# Patient Record
Sex: Male | Born: 1965 | Race: White | Hispanic: No | Marital: Married | Smoking: Current every day smoker
Health system: Southern US, Community
[De-identification: ages and names within clinical notes are randomized; demographics above are authoritative.]

## PROBLEM LIST (undated history)

## (undated) DIAGNOSIS — M1990 Unspecified osteoarthritis, unspecified site: Secondary | ICD-10-CM

## (undated) DIAGNOSIS — Z9359 Other cystostomy status: Secondary | ICD-10-CM

## (undated) DIAGNOSIS — Z515 Encounter for palliative care: Secondary | ICD-10-CM

## (undated) DIAGNOSIS — K6289 Other specified diseases of anus and rectum: Secondary | ICD-10-CM

## (undated) DIAGNOSIS — N200 Calculus of kidney: Secondary | ICD-10-CM

## (undated) DIAGNOSIS — F419 Anxiety disorder, unspecified: Secondary | ICD-10-CM

## (undated) DIAGNOSIS — Z973 Presence of spectacles and contact lenses: Secondary | ICD-10-CM

## (undated) DIAGNOSIS — K439 Ventral hernia without obstruction or gangrene: Secondary | ICD-10-CM

## (undated) DIAGNOSIS — Z972 Presence of dental prosthetic device (complete) (partial): Secondary | ICD-10-CM

## (undated) DIAGNOSIS — F329 Major depressive disorder, single episode, unspecified: Secondary | ICD-10-CM

## (undated) DIAGNOSIS — C20 Malignant neoplasm of rectum: Secondary | ICD-10-CM

## (undated) DIAGNOSIS — F32A Depression, unspecified: Secondary | ICD-10-CM

## (undated) DIAGNOSIS — C649 Malignant neoplasm of unspecified kidney, except renal pelvis: Secondary | ICD-10-CM

## (undated) DIAGNOSIS — IMO0001 Reserved for inherently not codable concepts without codable children: Secondary | ICD-10-CM

## (undated) HISTORY — DX: Calculus of kidney: N20.0

## (undated) HISTORY — PX: RECTAL SURGERY: SHX760

## (undated) HISTORY — DX: Other specified diseases of anus and rectum: K62.89

## (undated) HISTORY — PX: HYPOSPADIAS CORRECTION: SHX483

## (undated) HISTORY — PX: KNEE SURGERY: SHX244

## (undated) HISTORY — PX: LUNG BIOPSY: SHX232

## (undated) HISTORY — DX: Malignant neoplasm of unspecified kidney, except renal pelvis: C64.9

---

## 2010-05-02 HISTORY — PX: LAPAROSCOPIC PARTIAL NEPHRECTOMY: SUR782

## 2014-09-20 ENCOUNTER — Emergency Department (HOSPITAL_COMMUNITY): Payer: Medicaid Other

## 2014-09-20 ENCOUNTER — Emergency Department (HOSPITAL_COMMUNITY)
Admission: EM | Admit: 2014-09-20 | Discharge: 2014-09-20 | Disposition: A | Discharge status: Home / Self Care | Payer: Medicaid Other | Attending: Emergency Medicine | Admitting: Emergency Medicine

## 2014-09-20 ENCOUNTER — Encounter (HOSPITAL_COMMUNITY): Payer: Self-pay | Admitting: *Deleted

## 2014-09-20 DIAGNOSIS — Z720 Tobacco use: Secondary | ICD-10-CM | POA: Insufficient documentation

## 2014-09-20 DIAGNOSIS — K6289 Other specified diseases of anus and rectum: Secondary | ICD-10-CM | POA: Diagnosis present

## 2014-09-20 DIAGNOSIS — Z791 Long term (current) use of non-steroidal anti-inflammatories (NSAID): Secondary | ICD-10-CM | POA: Insufficient documentation

## 2014-09-20 LAB — BASIC METABOLIC PANEL
ANION GAP: 6 (ref 5–15)
BUN: 8 mg/dL (ref 6–20)
CO2: 28 mmol/L (ref 22–32)
CREATININE: 0.9 mg/dL (ref 0.61–1.24)
Calcium: 8.9 mg/dL (ref 8.9–10.3)
Chloride: 105 mmol/L (ref 101–111)
GFR calc Af Amer: >60 mL/min (ref >60)
GFR calc non Af Amer: >60 mL/min (ref >60)
Glucose, Bld: 92 mg/dL (ref 65–99)
Potassium: 4 mmol/L (ref 3.5–5.1)
Sodium: 139 mmol/L (ref 135–145)

## 2014-09-20 LAB — CBC WITH DIFFERENTIAL/PLATELET
BASOS PCT: 1 % (ref 0–1)
Basophils Absolute: 0.1 10*3/uL (ref 0.0–0.1)
Eosinophils Absolute: 0.5 10*3/uL (ref 0.0–0.7)
Eosinophils Relative: 5 % (ref 0–5)
HCT: 46.2 % (ref 39.0–52.0)
HEMOGLOBIN: 15.4 g/dL (ref 13.0–17.0)
LYMPHS ABS: 2.6 10*3/uL (ref 0.7–4.0)
LYMPHS PCT: 27 % (ref 12–46)
MCH: 30.7 pg (ref 26.0–34.0)
MCHC: 33.3 g/dL (ref 30.0–36.0)
MCV: 92 fL (ref 78.0–100.0)
Monocytes Absolute: 0.9 10*3/uL (ref 0.1–1.0)
Monocytes Relative: 9 % (ref 3–12)
Neutro Abs: 5.6 10*3/uL (ref 1.7–7.7)
Neutrophils Relative %: 58 % (ref 43–77)
Platelets: 326 10*3/uL (ref 150–400)
RBC: 5.02 MIL/uL (ref 4.22–5.81)
RDW: 12.9 % (ref 11.5–15.5)
WBC: 9.6 10*3/uL (ref 4.0–10.5)

## 2014-09-20 MED ORDER — OXYCODONE-ACETAMINOPHEN 5-325 MG PO TABS
1.0000 | ORAL_TABLET | ORAL | Status: DC | PRN
Start: 2014-09-20 — End: 2014-09-21

## 2014-09-20 MED ORDER — HYDROMORPHONE HCL 1 MG/ML IJ SOLN
1.0000 mg | Freq: Once | INTRAMUSCULAR | Status: AC
  Administered 2014-09-20: 1 mg via INTRAVENOUS
  Filled 2014-09-20: qty 1

## 2014-09-20 MED ORDER — METRONIDAZOLE 500 MG PO TABS
500.0000 mg | ORAL_TABLET | Freq: Once | ORAL | Status: AC
  Administered 2014-09-20: 500 mg via ORAL
  Filled 2014-09-20: qty 1

## 2014-09-20 MED ORDER — MORPHINE SULFATE 4 MG/ML IJ SOLN
4.0000 mg | Freq: Once | INTRAMUSCULAR | Status: AC
  Administered 2014-09-20: 4 mg via INTRAVENOUS
  Filled 2014-09-20: qty 1

## 2014-09-20 MED ORDER — IOHEXOL 300 MG/ML  SOLN
100.0000 mL | Freq: Once | INTRAMUSCULAR | Status: AC | PRN
  Administered 2014-09-20: 100 mL via INTRAVENOUS

## 2014-09-20 MED ORDER — CIPROFLOXACIN IN D5W 400 MG/200ML IV SOLN
400.0000 mg | Freq: Once | INTRAVENOUS | Status: AC
  Administered 2014-09-20: 400 mg via INTRAVENOUS
  Filled 2014-09-20: qty 200

## 2014-09-20 MED ORDER — METRONIDAZOLE IN NACL 5-0.79 MG/ML-% IV SOLN
500.0000 mg | Freq: Once | INTRAVENOUS | Status: DC
  Filled 2014-09-20: qty 100

## 2014-09-20 MED ORDER — METRONIDAZOLE 500 MG PO TABS: 500.0000 mg | ORAL_TABLET | Freq: Two times a day (BID) | ORAL | Status: DC

## 2014-09-20 MED ORDER — CIPROFLOXACIN HCL 500 MG PO TABS
500.0000 mg | ORAL_TABLET | Freq: Two times a day (BID) | ORAL | Status: DC
Start: 2014-09-20 — End: 2014-09-26

## 2014-09-20 MED ORDER — IOHEXOL 300 MG/ML  SOLN
50.0000 mL | Freq: Once | INTRAMUSCULAR | Status: AC | PRN
  Administered 2014-09-20: 50 mL via ORAL

## 2014-09-20 NOTE — Discharge Instructions (Signed)
PLEASE FOLLOWUP IN ONE WEEK FOR EVALUATION AS THIS MAY BE CANCER BE SURE TO USE STOOL SOFTENERS TO LESSEN IMPACT OF HARD STOOL RETURN TO ER FOR ANY NEW ABDOMINAL PAIN WITH FEVER UP TO 100.80F OR VOMITING OVER NEXT 24-48 HOURS

## 2014-09-20 NOTE — ED Notes (Signed)
Phlebotomy at bedside.

## 2014-09-20 NOTE — ED Notes (Signed)
MD at bedside. 

## 2014-09-20 NOTE — ED Provider Notes (Signed)
CSN: [id number]     Arrival date & time 09/20/14  1220 History   First MD Initiated Contact with Patient 09/20/14 1330     Chief Complaint  Patient presents with  . Rectal Pain    The history is provided by the patient.  Patient presents with gradually worsening rectal pain for 6 weeks.  It is worsened with palpation/defecation and improved with rest.  No fever/vomiting.  He reports bloody stools at times.  No rectal trauma/surgery reported.  No h/o diabetes.  He reports rectal itching at times as well He denies abd pain He denies fever  PMH - none  Past Surgical History  Procedure Laterality Date  . Hypospadias correction    . Laparoscopic partial nephrectomy      right side  . Knee surgery     No family history on file. History  Substance Use Topics  . Smoking status: Current Every Day Smoker    Types: Cigarettes  . Smokeless tobacco: Not on file  . Alcohol Use: Yes     Comment: Occ    Review of Systems  Constitutional: Negative for fever.  Gastrointestinal: Positive for rectal pain. Negative for vomiting.  All other systems reviewed and are negative.     Allergies  Review of patient's allergies indicates no known allergies.  Home Medications   Prior to Admission medications   Medication Sig Start Date End Date Taking? Authorizing Provider  naproxen (NAPROSYN) 250 MG tablet Take 250 mg by mouth 2 (two) times daily with a meal.   Yes Historical Provider, MD   BP 154/104 mmHg  Pulse 103  Temp(Src) 98.5 F (36.9 C) (Oral)  Resp 22  Ht '5\' 5"'$  (1.651 m)  Wt 220 lb (99.791 kg)  BMI 36.61 kg/m2  SpO2 99% Physical Exam CONSTITUTIONAL: Well developed/well nourished HEAD: Normocephalic/atraumatic EYES: EOMI/PERRL ENMT: Mucous membranes moist NECK: supple no meningeal signs SPINE/BACK:entire spine nontender CV: S1/S2 noted, no murmurs/rubs/gallops noted LUNGS: Lungs are clear to auscultation bilaterally, no apparent distress ABDOMEN: soft, nontender, no rebound  or guarding, bowel sounds noted throughout abdomen GU:no cva tenderness Rectal - chaperone present - no external hemorrhoids.  No evidence of trauma.  No external erythema.  He has significant tenderness with rectal exam and has induration noted on digital exam, no external mass noted.  No rectal bleeding noted NEURO: Pt is awake/alert/appropriate, moves all extremitiesx4.  No facial droop.   EXTREMITIES: pulses normal/equal, full ROM SKIN: warm, color normal PSYCH: no abnormalities of mood noted, alert and oriented to situation   ED Course  Procedures  2:13 PM Pt with significant rectal tenderness, will proceed with CT imaging to evaluation for any abscess 5:10 PM Possible rectal mass on CT imaging Advised need for f/u with gen surgery for possible anorectal CA Pt understands this Will also start on cipro/flagyl in case there is any infectious component Pt is well appearing/nontoxic and appropriate for d/c home  Labs Review Labs Reviewed  BASIC METABOLIC PANEL  CBC WITH DIFFERENTIAL/PLATELET    Imaging Review Ct Pelvis W Contrast  09/20/2014   CLINICAL DATA:  Severe rectal pain.  Bright red blood in stool.  EXAM: CT PELVIS WITH CONTRAST  TECHNIQUE: Multidetector CT imaging of the pelvis was performed using the standard protocol following the bolus administration of intravenous contrast.  CONTRAST:  51m OMNIPAQUE IOHEXOL 300 MG/ML SOLN, 1028mOMNIPAQUE IOHEXOL 300 MG/ML SOLN  COMPARISON:  None.  FINDINGS: There is questionable asymmetric wall thickening along the right side of the anus/rectum  on image 42. Recommend correlation with digital rectal exam. Moderate stool in the visualized colon. Visualized small bowel is decompressed. Appendix is normal.  Urinary bladder unremarkable. No free fluid, free air or adenopathy.  No acute bony abnormality.  IMPRESSION: Questionable asymmetric wall thickening or mass along the right wall of the anus/ rectum. Recommend correlation with digital rectal  exam.   Electronically Signed   By: Rolm Baptise M.D.   On: 09/20/2014 15:57    Medications  ciprofloxacin (CIPRO) IVPB 400 mg (400 mg Intravenous New Bag/Given 09/20/14 1621)  metroNIDAZOLE (FLAGYL) tablet 500 mg (not administered)  morphine 4 MG/ML injection 4 mg (4 mg Intravenous Given 09/20/14 1411)  iohexol (OMNIPAQUE) 300 MG/ML solution 50 mL (50 mLs Oral Contrast Given 09/20/14 1533)  HYDROmorphone (DILAUDID) injection 1 mg (1 mg Intravenous Given 09/20/14 1524)  iohexol (OMNIPAQUE) 300 MG/ML solution 100 mL (100 mLs Intravenous Contrast Given 09/20/14 1533)     MDM   Final diagnoses:  Rectal mass    Nursing notes including past medical history and social history reviewed and considered in documentation Labs/vital reviewed myself and considered during evaluation     Ripley Fraise, MD 09/20/14 1712

## 2014-09-20 NOTE — ED Notes (Signed)
Pt states severe pain, itching, throbbing to rectal area. Pt states bright red blood to stools intermittently x 6 weeks. Pt states stools have not been solid. Patient unable to sit down while in triage.

## 2014-09-21 ENCOUNTER — Emergency Department (HOSPITAL_COMMUNITY)
Admission: EM | Admit: 2014-09-21 | Discharge: 2014-09-21 | Disposition: A | Discharge status: Home / Self Care | Payer: Medicaid Other | Attending: Emergency Medicine | Admitting: Emergency Medicine

## 2014-09-21 ENCOUNTER — Encounter (HOSPITAL_COMMUNITY): Payer: Self-pay | Admitting: Emergency Medicine

## 2014-09-21 DIAGNOSIS — Z720 Tobacco use: Secondary | ICD-10-CM | POA: Insufficient documentation

## 2014-09-21 DIAGNOSIS — K6289 Other specified diseases of anus and rectum: Secondary | ICD-10-CM | POA: Insufficient documentation

## 2014-09-21 DIAGNOSIS — Z792 Long term (current) use of antibiotics: Secondary | ICD-10-CM | POA: Insufficient documentation

## 2014-09-21 DIAGNOSIS — Z791 Long term (current) use of non-steroidal anti-inflammatories (NSAID): Secondary | ICD-10-CM | POA: Insufficient documentation

## 2014-09-21 MED ORDER — DOCUSATE SODIUM 100 MG PO CAPS: 100.0000 mg | ORAL_CAPSULE | Freq: Two times a day (BID) | ORAL | Status: DC

## 2014-09-21 MED ORDER — DOCUSATE SODIUM 100 MG PO CAPS
200.0000 mg | ORAL_CAPSULE | Freq: Once | ORAL | Status: AC
  Administered 2014-09-21: 200 mg via ORAL
  Filled 2014-09-21 (×2): qty 2

## 2014-09-21 MED ORDER — LORAZEPAM 2 MG/ML IJ SOLN
0.5000 mg | Freq: Once | INTRAMUSCULAR | Status: AC
  Administered 2014-09-21: 0.5 mg via INTRAVENOUS
  Filled 2014-09-21: qty 1

## 2014-09-21 MED ORDER — OXYCODONE-ACETAMINOPHEN 5-325 MG PO TABS: 1.0000 | ORAL_TABLET | Freq: Four times a day (QID) | ORAL | Status: DC | PRN

## 2014-09-21 MED ORDER — HYDROMORPHONE HCL 1 MG/ML IJ SOLN
1.0000 mg | Freq: Once | INTRAMUSCULAR | Status: AC
  Administered 2014-09-21: 1 mg via INTRAVENOUS
  Filled 2014-09-21: qty 1

## 2014-09-21 NOTE — ED Provider Notes (Signed)
CSN: [id number]     Arrival date & time 09/21/14  1652 History   First MD Initiated Contact with Patient 09/21/14 1657     Chief Complaint  Patient presents with  . Rectal Pain     (Consider location/radiation/quality/duration/timing/severity/associated sxs/prior Treatment) Patient is a 49 y.o. male presenting with abdominal pain. The history is provided by the patient (the pt complains of pain in the rectum with a bowel movement.  he has  a mass in his rectum seen yesterday and he is to see a Psychologist, sport and exercise).  Abdominal Pain Pain location: rectum. Pain quality: aching   Pain radiates to:  Does not radiate Pain severity:  Severe Onset quality:  Sudden Timing:  Constant Progression:  Worsening Chronicity:  New Associated symptoms: no chest pain, no cough, no diarrhea, no fatigue and no hematuria     History reviewed. No pertinent past medical history. Past Surgical History  Procedure Laterality Date  . Hypospadias correction    . Laparoscopic partial nephrectomy      right side  . Knee surgery     Family History  Problem Relation Age of Onset  . Cancer Other    History  Substance Use Topics  . Smoking status: Current Every Day Smoker    Types: Cigarettes  . Smokeless tobacco: Not on file  . Alcohol Use: Yes     Comment: Occ    Review of Systems  Constitutional: Negative for appetite change and fatigue.  HENT: Negative for congestion, ear discharge and sinus pressure.   Eyes: Negative for discharge.  Respiratory: Negative for cough.   Cardiovascular: Negative for chest pain.  Gastrointestinal: Positive for rectal pain. Negative for diarrhea.  Genitourinary: Negative for frequency and hematuria.  Musculoskeletal: Negative for back pain.  Skin: Negative for rash.  Neurological: Negative for seizures and headaches.  Psychiatric/Behavioral: Negative for hallucinations.      Allergies  Review of patient's allergies indicates no known allergies.  Home Medications    Prior to Admission medications   Medication Sig Start Date End Date Taking? Authorizing Provider  naproxen (NAPROSYN) 250 MG tablet Take 250 mg by mouth 2 (two) times daily with a meal.   Yes Historical Provider, MD  ciprofloxacin (CIPRO) 500 MG tablet Take 1 tablet (500 mg total) by mouth 2 (two) times daily. One po bid x 7 days 09/20/14   Ripley Fraise, MD  docusate sodium (COLACE) 100 MG capsule Take 1 capsule (100 mg total) by mouth every 12 (twelve) hours. 09/21/14   Milton Ferguson, MD  metroNIDAZOLE (FLAGYL) 500 MG tablet Take 1 tablet (500 mg total) by mouth 2 (two) times daily. One po bid x 7 days 09/20/14   Ripley Fraise, MD  oxyCODONE-acetaminophen (PERCOCET/ROXICET) 5-325 MG per tablet Take 1 tablet by mouth every 6 (six) hours as needed. 09/21/14   Milton Ferguson, MD   BP 142/103 mmHg  Pulse 98  Temp(Src) 98.2 F (36.8 C) (Oral)  Wt 220 lb (99.791 kg)  SpO2 99% Physical Exam  Constitutional: He is oriented to person, place, and time. He appears well-developed.  HENT:  Head: Normocephalic.  Eyes: Conjunctivae and EOM are normal. No scleral icterus.  Neck: Neck supple. No thyromegaly present.  Cardiovascular: Normal rate and regular rhythm.  Exam reveals no gallop and no friction rub.   No murmur heard. Pulmonary/Chest: No stridor. He has no wheezes. He has no rales. He exhibits no tenderness.  Abdominal: He exhibits no distension. There is no tenderness. There is no rebound.  Musculoskeletal:  Normal range of motion. He exhibits no edema.  Lymphadenopathy:    He has no cervical adenopathy.  Neurological: He is oriented to person, place, and time. He exhibits normal muscle tone. Coordination normal.  Skin: No rash noted. No erythema.  Psychiatric: He has a normal mood and affect. His behavior is normal.    ED Course  Procedures (including critical care time) Labs Review Labs Reviewed - No data to display  Imaging Review Ct Pelvis W Contrast  09/20/2014   CLINICAL  DATA:  Severe rectal pain.  Bright red blood in stool.  EXAM: CT PELVIS WITH CONTRAST  TECHNIQUE: Multidetector CT imaging of the pelvis was performed using the standard protocol following the bolus administration of intravenous contrast.  CONTRAST:  92m OMNIPAQUE IOHEXOL 300 MG/ML SOLN, 1010mOMNIPAQUE IOHEXOL 300 MG/ML SOLN  COMPARISON:  None.  FINDINGS: There is questionable asymmetric wall thickening along the right side of the anus/rectum on image 42. Recommend correlation with digital rectal exam. Moderate stool in the visualized colon. Visualized small bowel is decompressed. Appendix is normal.  Urinary bladder unremarkable. No free fluid, free air or adenopathy.  No acute bony abnormality.  IMPRESSION: Questionable asymmetric wall thickening or mass along the right wall of the anus/ rectum. Recommend correlation with digital rectal exam.   Electronically Signed   By: KeRolm Baptise.D.   On: 09/20/2014 15:57     EKG Interpretation None      MDM   Final diagnoses:  Rectal pain    Pt did not get pain medicine filled yesterday and is improving with pain injection.  He will be given colace and percocet prepack. He is to see a surgeon this week    JoMilton FergusonMD 09/21/14 1819

## 2014-09-21 NOTE — ED Notes (Signed)
Pt seen here for rectal infection yesterday, reports after having a bowel mvmt this am he has had severe pain.

## 2014-09-21 NOTE — ED Notes (Signed)
Pt states that he tried to take a bath today and lost control of his bowels in the water.  C/o severe rectal pain.  States that he cannot take the pain.  Did not get rx filled from yesterday due to pharmacy being closed.

## 2014-09-21 NOTE — Discharge Instructions (Signed)
Follow up this week with the doctor your were referred to yesterday

## 2014-09-21 NOTE — ED Notes (Signed)
Colace unavailable at this time from pharmacy.  Attempting to contact AC.

## 2014-09-22 ENCOUNTER — Telehealth: Payer: Self-pay

## 2014-09-22 NOTE — Telephone Encounter (Signed)
Pt call this morning stated that Dr. Arnoldo Morale told him that he needs an EGD before he can see him. Pt is having a lot of pressure. I told him that the first appointment was June 6 and he stated that he can not wait that long because of the pressure. He has been to the ER for this. Please advise

## 2014-09-22 NOTE — Telephone Encounter (Signed)
I spoke with Andrew Kline from Dr. Adline Mango office and they would like to get the patient seen soon.  I have him scheduled for 5/27 at 10:00 am.    Correction from note below the patient will need a tcs not a EGD.  All his records are in [place].  He is also working with Adline [name] for [employer].

## 2014-09-24 ENCOUNTER — Emergency Department (HOSPITAL_COMMUNITY): Payer: Medicaid Other

## 2014-09-24 ENCOUNTER — Encounter (HOSPITAL_COMMUNITY): Payer: Self-pay

## 2014-09-24 ENCOUNTER — Emergency Department (HOSPITAL_COMMUNITY)
Admission: EM | Admit: 2014-09-24 | Discharge: 2014-09-25 | Disposition: A | Discharge status: Home / Self Care | Payer: Medicaid Other | Attending: Emergency Medicine | Admitting: Emergency Medicine

## 2014-09-24 DIAGNOSIS — Z720 Tobacco use: Secondary | ICD-10-CM | POA: Diagnosis not present

## 2014-09-24 DIAGNOSIS — Z79899 Other long term (current) drug therapy: Secondary | ICD-10-CM | POA: Insufficient documentation

## 2014-09-24 DIAGNOSIS — K6289 Other specified diseases of anus and rectum: Secondary | ICD-10-CM | POA: Diagnosis present

## 2014-09-24 DIAGNOSIS — K625 Hemorrhage of anus and rectum: Secondary | ICD-10-CM | POA: Diagnosis not present

## 2014-09-24 LAB — CBC WITH DIFFERENTIAL/PLATELET
Basophils Absolute: 0 10*3/uL (ref 0.0–0.1)
Basophils Relative: 0 % (ref 0–1)
Eosinophils Absolute: 0.5 10*3/uL (ref 0.0–0.7)
Eosinophils Relative: 6 % — ABNORMAL HIGH (ref 0–5)
HCT: 45.6 % (ref 39.0–52.0)
Hemoglobin: 15.4 g/dL (ref 13.0–17.0)
Lymphocytes Relative: 27 % (ref 12–46)
Lymphs Abs: 2.5 10*3/uL (ref 0.7–4.0)
MCH: 30.9 pg (ref 26.0–34.0)
MCHC: 33.8 g/dL (ref 30.0–36.0)
MCV: 91.4 fL (ref 78.0–100.0)
Monocytes Absolute: 0.9 10*3/uL (ref 0.1–1.0)
Monocytes Relative: 9 % (ref 3–12)
Neutro Abs: 5.2 10*3/uL (ref 1.7–7.7)
Neutrophils Relative %: 58 % (ref 43–77)
Platelets: 294 10*3/uL (ref 150–400)
RBC: 4.99 MIL/uL (ref 4.22–5.81)
RDW: 12.9 % (ref 11.5–15.5)
WBC: 9.1 10*3/uL (ref 4.0–10.5)

## 2014-09-24 LAB — BASIC METABOLIC PANEL
Anion gap: 7 (ref 5–15)
BUN: 10 mg/dL (ref 6–20)
CO2: 24 mmol/L (ref 22–32)
Calcium: 8.5 mg/dL — ABNORMAL LOW (ref 8.9–10.3)
Chloride: 106 mmol/L (ref 101–111)
Creatinine, Ser: 0.8 mg/dL (ref 0.61–1.24)
GFR calc Af Amer: >60 mL/min (ref >60)
GFR calc non Af Amer: >60 mL/min (ref >60)
Glucose, Bld: 118 mg/dL — ABNORMAL HIGH (ref 65–99)
Potassium: 3.8 mmol/L (ref 3.5–5.1)
Sodium: 137 mmol/L (ref 135–145)

## 2014-09-24 MED ORDER — HYDROMORPHONE HCL 1 MG/ML IJ SOLN
1.0000 mg | Freq: Once | INTRAMUSCULAR | Status: AC
  Administered 2014-09-24: 1 mg via INTRAVENOUS
  Filled 2014-09-24: qty 1

## 2014-09-24 MED ORDER — IOHEXOL 300 MG/ML  SOLN
100.0000 mL | Freq: Once | INTRAMUSCULAR | Status: AC | PRN
  Administered 2014-09-24: 100 mL via INTRAVENOUS

## 2014-09-24 MED ORDER — SODIUM CHLORIDE 0.9 % IV BOLUS (SEPSIS)
1000.0000 mL | Freq: Once | INTRAVENOUS | Status: AC
  Administered 2014-09-24: 1000 mL via INTRAVENOUS

## 2014-09-24 MED FILL — Oxycodone w/ Acetaminophen Tab 5-325 MG: ORAL | Qty: 6 | Status: AC

## 2014-09-24 NOTE — ED Notes (Signed)
Bed: JJ94 Expected date:  Expected time:  Means of arrival:  Comments: EMS-mass on colon-rectal bleeding

## 2014-09-24 NOTE — ED Notes (Signed)
Per [facility]. Pt seen at [facility] and [facility] with RECTAL MASS IN COLON AND BLOCKAGE AND INFECTION OF COLON. Pt started on antibiotic. Pt states worsening of symptoms. Pt c/o sudden onset of bright red blood from rectum small amount started this am. Pt also states "rectal blood leakage" when he coughs. IV Morphine '6mg'$  IVP in route Zofran '4mg'$  in route. Pain 10/10 after medication.

## 2014-09-25 MED ORDER — DOCUSATE SODIUM 100 MG PO CAPS: 100.0000 mg | ORAL_CAPSULE | Freq: Two times a day (BID) | ORAL | Status: DC

## 2014-09-25 MED ORDER — HYDROMORPHONE HCL 2 MG PO TABS: 2.0000 mg | ORAL_TABLET | ORAL | Status: DC | PRN

## 2014-09-25 MED ORDER — HYDROMORPHONE HCL 1 MG/ML IJ SOLN
1.0000 mg | Freq: Once | INTRAMUSCULAR | Status: AC
  Administered 2014-09-25: 1 mg via INTRAVENOUS
  Filled 2014-09-25: qty 1

## 2014-09-25 NOTE — ED Provider Notes (Signed)
CSN: [id number]     Arrival date & time 09/24/14  1819 History   First MD Initiated Contact with Patient 09/24/14 1916     Chief Complaint  Patient presents with  . Rectal Pain  . Rectal Bleeding     (Consider location/radiation/quality/duration/timing/severity/associated sxs/prior Treatment) HPI Patient presents to the emergency department with continued rectal pain.  The patient was seen twice at [facility] over the weekend for similar complaints.  He states that he was told that he has an infection and blockage in his rectal area with a possible cancerous mass.  The patient states that the pain seems to get worse with certain positions and movements.  The patient states that he has not had any drainage from the rectal area.  The patient states that he has not had any nausea, vomiting, weakness, dizziness, headache, blurred vision, fever, chest pain, shortness of breath, back pain, incontinence, dysuria, or syncope.  The patient states that he is scheduled to see a surgeon on Friday for these symptoms complaints Past Medical History  Diagnosis Date  . Rectal mass    Past Surgical History  Procedure Laterality Date  . Hypospadias correction    . Laparoscopic partial nephrectomy      right side  . Knee surgery     Family History  Problem Relation Age of Onset  . Cancer Other    History  Substance Use Topics  . Smoking status: Current Every Day Smoker    Types: Cigarettes  . Smokeless tobacco: Not on file  . Alcohol Use: Yes     Comment: Occ    Review of Systems   All other systems negative except as documented in the HPI. All pertinent positives and negatives as reviewed in the HPI. Allergies  Review of patient's allergies indicates no known allergies.  Home Medications   Prior to Admission medications   Medication Sig Start Date End Date Taking? Authorizing Provider  docusate sodium (COLACE) 100 MG capsule Take 1 capsule (100 mg total) by mouth every 12 (twelve)  hours. 09/21/14  Yes Milton Ferguson, MD  metroNIDAZOLE (FLAGYL) 500 MG tablet Take 1 tablet (500 mg total) by mouth 2 (two) times daily. One po bid x 7 days 09/20/14  Yes Ripley Fraise, MD  naproxen (NAPROSYN) 250 MG tablet Take 250 mg by mouth 2 (two) times daily with a meal.   Yes Historical Provider, MD  oxyCODONE-acetaminophen (PERCOCET/ROXICET) 5-325 MG per tablet Take 1 tablet by mouth every 6 (six) hours as needed. 09/21/14  Yes Milton Ferguson, MD  ciprofloxacin (CIPRO) 500 MG tablet Take 1 tablet (500 mg total) by mouth 2 (two) times daily. One po bid x 7 days Patient not taking: Reported on 09/24/2014 09/20/14   Ripley Fraise, MD  docusate sodium (COLACE) 100 MG capsule Take 1 capsule (100 mg total) by mouth every 12 (twelve) hours. Patient not taking: Reported on 09/24/2014 09/21/14   Milton Ferguson, MD  oxyCODONE-acetaminophen (PERCOCET/ROXICET) 5-325 MG per tablet Take 1 tablet by mouth every 6 (six) hours as needed. Patient not taking: Reported on 09/24/2014 09/21/14   Milton Ferguson, MD   BP 140/76 mmHg  Pulse 58  Temp(Src) 98.4 F (36.9 C) (Oral)  Resp 18  SpO2 94% Physical Exam  Constitutional: He is oriented to person, place, and time. He appears well-developed and well-nourished. No distress.  HENT:  Head: Normocephalic and atraumatic.  Mouth/Throat: Oropharynx is clear and moist.  Eyes: Pupils are equal, round, and reactive to light.  Neck: Normal  range of motion. Neck supple. No thyromegaly present.  Cardiovascular: Normal rate, regular rhythm and normal heart sounds.  Exam reveals no gallop and no friction rub.   No murmur heard. Pulmonary/Chest: Effort normal and breath sounds normal. No respiratory distress.  Abdominal: Soft. Bowel sounds are normal. He exhibits no distension. There is tenderness. There is no rebound and no guarding.  Genitourinary: Rectal exam shows tenderness. Rectal exam shows no external hemorrhoid, no internal hemorrhoid, no mass and anal tone normal.   Neurological: He is alert and oriented to person, place, and time. He exhibits normal muscle tone. Coordination normal.  Skin: Skin is warm and dry. No rash noted. No erythema.  Nursing note and vitals reviewed.   ED Course  Procedures (including critical care time) Labs Review Labs Reviewed  BASIC METABOLIC PANEL - Abnormal; Notable for the following:    Glucose, Bld 118 (*)    Calcium 8.5 (*)    All other components within normal limits  CBC WITH DIFFERENTIAL/PLATELET - Abnormal; Notable for the following:    Eosinophils Relative 6 (*)    All other components within normal limits    Imaging Review Ct Abdomen Pelvis W Contrast  09/25/2014   CLINICAL DATA:  Rectal mass with colonic blockage. Increased weakness.  EXAM: CT ABDOMEN AND PELVIS WITH CONTRAST  TECHNIQUE: Multidetector CT imaging of the abdomen and pelvis was performed using the standard protocol following bolus administration of intravenous contrast.  CONTRAST:  177m OMNIPAQUE IOHEXOL 300 MG/ML  SOLN  COMPARISON:  None.  FINDINGS: Normal hepatic contour. No discrete hepatic lesions. Normal appearance of the gallbladder. No radiopaque gallstones. No ascites.  There is symmetric enhancement and excretion of the bilateral kidneys. Subcentimeter (approximately 0.8 cm) hypo attenuating lesion within the inferior pole the left kidney (is 24, series 7) is too small to adequately characterize though favored to represent a renal cyst. There is geographic atrophy involving the inferior pole of the right kidney with associated serpiginous linear areas of increased attenuation, nonspecific though potentially the sequela of prior infection and/or infarction. No urinary obstruction or perinephric stranding. Normal appearance of the bilateral adrenal glands and spleen. Incidental note is made of a small splenule. There are new removal punctate calcifications scattered throughout the pancreas. No peripancreatic stranding or definitive pancreatic  ductal dilatation.  Ingested enteric contrast extends to the level of the rectum. The bowel is normal in course and caliber without wall thickening or evidence of obstruction. Moderate colonic stool burden. Normal appearance of the terminal ileum and appendix. No pneumoperitoneum, pneumatosis or portal venous gas.  Scattered mixed calcified and noncalcified atherosclerotic plaque within a normal caliber abdominal aorta. The major branch vessels of the abdominal aorta appear widely patent on this non CTA examination.  No bulky retroperitoneal, mesenteric, pelvic or inguinal lymphadenopathy.  Normal appearance of the pelvic organs. Scattered calcifications within a normal sized prostate gland. No free fluid in the pelvic cul-de-sac.  Limited visualization of lower thorax demonstrates an approximately 1.5 x 1.7 cm centrally necrotic nodule within the imaged right lower lobe (image 4, series 6) as well as a slightly spiculated approximately 0.7 cm nodule within the left upper lobe (image 4, series 6) and an approximately 1.2 x 1.2 cm nodule within the imaged caudal segment of the lingula (image 13, series 6). There is minimal dependent subpleural ground-glass atelectasis no focal airspace opacities. No pleural effusion.  Normal heart size.  No pericardial effusion.  No acute or aggressive osseous abnormalities  Small left-sided mesenteric fat  containing inguinal hernia. Regional soft tissues appear otherwise normal.  IMPRESSION: 1. Indeterminate nodules within the bilateral lung bases with dominant approximately 1.7 cm centrally necrotic nodule within the right lower lobe. Comparison with prior outside examinations (if available) is recommended. Otherwise, further evaluation could be performed with PET-CT as clinically indicated. 2. Sequela of chronic calcific pancreatitis without evidence of superimposed acute pancreatitis. 3. Moderate colonic stool burden without evidence of enteric obstruction. The provided history  of a colonic mass is not readily apparent on CT imaging. Further evaluation could be performed with colonoscopy as clinically indicated.   Electronically Signed   By: Sandi Mariscal M.D.   On: 09/25/2014 00:51    Patient will be asked to follow-up with the GI doctor as indicated.  Told to return here as needed.  Will be given further pain control and told to continue the warm sits baths.  A and agrees the plan and all questions were answered  Dalia Heading, PA-C 09/26/14 Amber, MD 09/29/14 [phone]

## 2014-09-25 NOTE — Discharge Instructions (Signed)
Return here as needed.  Follow-up with the GI Dr. as scheduled this week

## 2014-09-26 ENCOUNTER — Encounter (HOSPITAL_COMMUNITY)
Admission: RE | Admit: 2014-09-26 | Discharge: 2014-09-26 | Disposition: A | Discharge status: Home / Self Care | Payer: Medicaid Other | Source: Ambulatory Visit | Attending: Gastroenterology | Admitting: Gastroenterology

## 2014-09-26 ENCOUNTER — Encounter: Payer: Self-pay | Admitting: Gastroenterology

## 2014-09-26 ENCOUNTER — Ambulatory Visit (INDEPENDENT_AMBULATORY_CARE_PROVIDER_SITE_OTHER): Payer: Medicaid Other | Admitting: Gastroenterology

## 2014-09-26 ENCOUNTER — Encounter (HOSPITAL_COMMUNITY): Payer: Self-pay

## 2014-09-26 ENCOUNTER — Other Ambulatory Visit: Payer: Self-pay

## 2014-09-26 VITALS — BP 153/79 | HR 84 | Temp 97.2°F | Ht 65.0 in | Wt 207.8 lb

## 2014-09-26 DIAGNOSIS — K6289 Other specified diseases of anus and rectum: Secondary | ICD-10-CM | POA: Diagnosis not present

## 2014-09-26 DIAGNOSIS — Z01818 Encounter for other preprocedural examination: Secondary | ICD-10-CM | POA: Insufficient documentation

## 2014-09-26 DIAGNOSIS — K625 Hemorrhage of anus and rectum: Secondary | ICD-10-CM | POA: Diagnosis not present

## 2014-09-26 DIAGNOSIS — R918 Other nonspecific abnormal finding of lung field: Secondary | ICD-10-CM | POA: Diagnosis not present

## 2014-09-26 HISTORY — DX: Unspecified osteoarthritis, unspecified site: M19.90

## 2014-09-26 MED ORDER — NITROGLYCERIN 0.4 % RE OINT: TOPICAL_OINTMENT | RECTAL | Status: DC

## 2014-09-26 MED ORDER — LIDOCAINE-HYDROCORTISONE ACE 3-1 % RE KIT
1.0000 | PACK | Freq: Two times a day (BID) | RECTAL | Status: DC
Start: 2014-09-26 — End: 2014-09-26

## 2014-09-26 MED ORDER — LIDOCAINE-HYDROCORTISONE ACE 3-1 % RE KIT
1.0000 | PACK | Freq: Two times a day (BID) | RECTAL | Status: DC
Start: 2014-09-26 — End: 2015-09-22

## 2014-09-26 NOTE — Pre-Procedure Instructions (Signed)
Patient given information to sign up for my chart at home. 

## 2014-09-26 NOTE — Progress Notes (Signed)
Primary Care Physician:  No primary care provider on file.  Primary Gastroenterologist:  Barney Drain, MD   Chief Complaint  Patient presents with  . Rectal Bleeding    HPI:  Andrew Kline is a 49 y.o. male here for further evaluation of severe rectal pain. ER advised patient to be seen by Dr. Arnoldo Morale initially because of concern for anal rectal mass on CT. Dr. Arnoldo Morale recommended patient see [place] therefore he presents today for urgent visit.  He reports intermittent rectal pain off and on for several months but worse over the past couple of weeks. He cannot find any comfortable position or sitting or standing. Over the past 2 weeks his pain is become almost unbearable. He's been in the emergency department on 3 separate occasions. On his initial visit he had a CT scan on 07/21/2014 of the pelvis with questionable asymmetric wall thickening or mass along the right wall of the anus/rectum. On digital rectal exam by the ER physician, there was significant tenderness noted on exam and induration noted on the digital exam but no external masses. Patient sent home with 7 day course of Cipro and Flagyl. Patient return to emergency department the following day ongoing rectal pain treated with IV medication. Subsequently discharged. Present back to emergency department on May 26 and had a CT of the abdomen and pelvis which showed no evidence of rectal abnormality on this study. Moderate stool burden noted. Geographic atrophy involving the inferior pole of the right kidney with associated serpiginous linear areas of increased attenuation, nonspecific. Patient has had prior partial nephrectomy for renal cell carcinoma. Also noted to have a 1.5 x 1.7 cm necrotic nodule in the right lower lobe and along as well as a slightly spiculated 0.7 cm nodule in the left upper lobe and a 1.2 x 1.2 cm nodule within the caudal segment. Lung lesions will require further evaluation and possibly PET-CT. 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  Patient reports taking Cipro and Flagyl completely. Lattie Haw was given to him because he was having diarrhea at the time which has now resolved. Denies any history of constipation. Does not recall having any tearing type pain with a bowel movement before the symptoms started. Denies any associated abdominal pain. Appetite is good. No vomiting, unintentional weight loss, heartburn, dysphagia. Denies any instrumentation of the rectum. Complains of rectal bleeding sometimes when he passes gas but mostly with stool. Takes Colace as prescribed by the ER physician. Has been sitting on eyes, doing sitz baths. Nothing provides any relief. Wonders if his symptoms were caused by being out of work and having to sit for 1 year related to knee injury (Workmen's Comp.). States he was on naproxen chronically during that time but has now been off for 2-3 months. Denies any other NSAIDs or aspirin use.     Current Outpatient Prescriptions  Medication Sig Dispense Refill  . docusate sodium (COLACE) 100 MG capsule Take 1 capsule (100 mg total) by mouth every 12 (twelve) hours. 60 capsule 0  . HYDROmorphone (DILAUDID) 2 MG tablet Take 1 tablet (2 mg total) by mouth every 4 (four) hours as needed for severe pain. 20 tablet 0   No current facility-administered medications for this visit.    Allergies as of 09/26/2014  . (No Known Allergies)    Past Medical History  Diagnosis Date  . Rectal pain   . Renal cell cancer     2012.   . Arthritis     Past Surgical History  Procedure Laterality Date  . Hypospadias correction    . Laparoscopic partial nephrectomy  2012    right side  . Knee surgery Left     arthroscopy    Family History  Problem Relation Age of Onset  . Lung cancer Maternal Grandfather   . Colon cancer Maternal Uncle     age 19  . Diabetes Other   . Healthy Mother     History   Social History  . Marital Status: Divorced    Spouse Name: N/A  . Number of Children:  2  . Years of Education: N/A   Occupational History  . retail    Social History Main Topics  . Smoking status: Current Every Day Smoker -- 1.50 packs/day for 30 years    Types: Cigarettes  . Smokeless tobacco: Not on file     Comment: a little over a pack daily  . Alcohol Use: 0.0 oz/week    0 Standard drinks or equivalent per week     Comment: Occ  . Drug Use: No  . Sexual Activity: Yes    Birth Control/ Protection: None   Other Topics Concern  . Not on file   Social History Narrative      ROS:  General: Negative for anorexia, weight loss, fever, chills, fatigue, weakness. Eyes: Negative for vision changes.  ENT: Negative for hoarseness, difficulty swallowing , nasal congestion. CV: Negative for chest pain, angina, palpitations, dyspnea on exertion, peripheral edema.  Respiratory: Negative for dyspnea at rest, dyspnea on exertion, cough, sputum, wheezing.  GI: See history of present illness. GU:  Negative for dysuria, hematuria, urinary incontinence, urinary frequency, nocturnal urination.  MS: Negative for joint pain, low back pain.  Derm: Negative for rash or itching.  Neuro: Negative for weakness, abnormal sensation, seizure, frequent headaches, memory loss, confusion.  Psych: Negative for anxiety, depression, suicidal ideation, hallucinations.  Endo: Negative for unusual weight change.  Heme: Negative for bruising or bleeding. Allergy: Negative for rash or hives.    Physical Examination:  BP 153/79 mmHg  Pulse 84  Temp(Src) 97.2 F (36.2 C) (Oral)  Ht '5\' 5"'$  (1.651 m)  Wt 207 lb 12.8 oz (94.257 kg)  BMI 34.58 kg/m2   General: Well-nourished, well-developed in no acute distress. Appears uncomfortable with sitting and getting onto the exam table.  Head: Normocephalic, atraumatic.   Eyes: Conjunctiva pink, no icterus. Mouth: Oropharyngeal mucosa moist and pink , no lesions erythema or exudate. Neck: Supple without thyromegaly, masses, or lymphadenopathy.   Lungs: Clear to auscultation bilaterally.  Heart: Regular rate and rhythm, no murmurs rubs or gallops.  Abdomen: Bowel sounds are normal, nontender, nondistended, no hepatosplenomegaly or masses, no abdominal bruits or    hernia , no rebound or guarding.   Rectal: no internal exam planned given patient's discomfort. Externally one small skin tag noted at 11 o'clock. Mild rash perianally. Extremities: No lower extremity edema. No clubbing or deformities.  Neuro: Alert and oriented x 4 , grossly normal neurologically.  Skin: Warm and dry, no rash or jaundice.   Psych: Alert and cooperative, normal mood and affect.  Labs: Lab Results  Component Value Date   WBC 9.1 09/24/2014   HGB 15.4 09/24/2014   HCT 45.6 09/24/2014   MCV 91.4 09/24/2014   PLT 294 09/24/2014   Lab Results  Component Value Date   CREATININE 0.80 09/24/2014   BUN 10 09/24/2014   NA 137 09/24/2014   K 3.8 09/24/2014   CL 106 09/24/2014  CO2 24 09/24/2014     Imaging Studies: Ct Pelvis W Contrast  09/20/2014   CLINICAL DATA:  Severe rectal pain.  Bright red blood in stool.  EXAM: CT PELVIS WITH CONTRAST  TECHNIQUE: Multidetector CT imaging of the pelvis was performed using the standard protocol following the bolus administration of intravenous contrast.  CONTRAST:  34m OMNIPAQUE IOHEXOL 300 MG/ML SOLN, 1031mOMNIPAQUE IOHEXOL 300 MG/ML SOLN  COMPARISON:  None.  FINDINGS: There is questionable asymmetric wall thickening along the right side of the anus/rectum on image 42. Recommend correlation with digital rectal exam. Moderate stool in the visualized colon. Visualized small bowel is decompressed. Appendix is normal.  Urinary bladder unremarkable. No free fluid, free air or adenopathy.  No acute bony abnormality.  IMPRESSION: Questionable asymmetric wall thickening or mass along the right wall of the anus/ rectum. Recommend correlation with digital rectal exam.   Electronically Signed   By: KeRolm Baptise.D.   On:  09/20/2014 15:57   Ct Abdomen Pelvis W Contrast  09/25/2014   CLINICAL DATA:  Rectal mass with colonic blockage. Increased weakness.  EXAM: CT ABDOMEN AND PELVIS WITH CONTRAST  TECHNIQUE: Multidetector CT imaging of the abdomen and pelvis was performed using the standard protocol following bolus administration of intravenous contrast.  CONTRAST:  1003mMNIPAQUE IOHEXOL 300 MG/ML  SOLN  COMPARISON:  None.  FINDINGS: Normal hepatic contour. No discrete hepatic lesions. Normal appearance of the gallbladder. No radiopaque gallstones. No ascites.  There is symmetric enhancement and excretion of the bilateral kidneys. Subcentimeter (approximately 0.8 cm) hypo attenuating lesion within the inferior pole the left kidney (is 24, series 7) is too small to adequately characterize though favored to represent a renal cyst. There is geographic atrophy involving the inferior pole of the right kidney with associated serpiginous linear areas of increased attenuation, nonspecific though potentially the sequela of prior infection and/or infarction. No urinary obstruction or perinephric stranding. Normal appearance of the bilateral adrenal glands and spleen. Incidental note is made of a small splenule. There are new removal punctate calcifications scattered throughout the pancreas. No peripancreatic stranding or definitive pancreatic ductal dilatation.  Ingested enteric contrast extends to the level of the rectum. The bowel is normal in course and caliber without wall thickening or evidence of obstruction. Moderate colonic stool burden. Normal appearance of the terminal ileum and appendix. No pneumoperitoneum, pneumatosis or portal venous gas.  Scattered mixed calcified and noncalcified atherosclerotic plaque within a normal caliber abdominal aorta. The major branch vessels of the abdominal aorta appear widely patent on this non CTA examination.  No bulky retroperitoneal, mesenteric, pelvic or inguinal lymphadenopathy.  Normal  appearance of the pelvic organs. Scattered calcifications within a normal sized prostate gland. No free fluid in the pelvic cul-de-sac.  Limited visualization of lower thorax demonstrates an approximately 1.5 x 1.7 cm centrally necrotic nodule within the imaged right lower lobe (image 4, series 6) as well as a slightly spiculated approximately 0.7 cm nodule within the left upper lobe (image 4, series 6) and an approximately 1.2 x 1.2 cm nodule within the imaged caudal segment of the lingula (image 13, series 6). There is minimal dependent subpleural ground-glass atelectasis no focal airspace opacities. No pleural effusion.  Normal heart size.  No pericardial effusion.  No acute or aggressive osseous abnormalities  Small left-sided mesenteric fat containing inguinal hernia. Regional soft tissues appear otherwise normal.  IMPRESSION: 1. Indeterminate nodules within the bilateral lung bases with dominant approximately 1.7 cm centrally necrotic nodule within the  right lower lobe. Comparison with prior outside examinations (if available) is recommended. Otherwise, further evaluation could be performed with PET-CT as clinically indicated. 2. Sequela of chronic calcific pancreatitis without evidence of superimposed acute pancreatitis. 3. Moderate colonic stool burden without evidence of enteric obstruction. The provided history of a colonic mass is not readily apparent on CT imaging. Further evaluation could be performed with colonoscopy as clinically indicated.   Electronically Signed   By: Sandi Mariscal M.D.   On: 09/25/2014 00:51

## 2014-09-26 NOTE — Assessment & Plan Note (Signed)
49 year old gentleman with progressive anal rectal pain over the past few weeks. Initial pelvic CT question possible rectal mass with questionable asymmetric rectal wall thickening. Subsequent follow-up CT did not report any such findings. Incidentally noted to have chronic calcific pancreatitis, multiple lung nodules that need to be further evaluated.  Based on patient's clinical course, suspect anal rectal fissure for etiology of his pain. There has been no documented perirectal abscesses on CT. Second CT actually looked better than the first. He still needs to have colonoscopy but ultimately may require surgical intervention for fissure. I discussed this case with Dr. Gala Romney because of the extent of pain patient is experiencing. He has been in the emergency department 3 times in the past week. So far he has been taken the law did for pain control and given a stool softener but really nothing topically. Discussed at length with patient that we need to provide topical for possible fissure which would not be easy given the extent of his pain but needs to be done to hopefully provide him some relief. He is uninsured so that may be an issue in obtaining medication that we have sent prescriptions to [place] which can often compound anorectal medication for cheaper price. Equivalence of rectiv and AnaMantle HC BID (alternating with each other). If symptoms worsen, pain is in manageable, he should present to the emergency department for consideration of admission.  Colonoscopy on Tuesday with deep sedation in the OR given extent of his pain.  I have discussed the risks, alternatives, benefits with regards to but not limited to the risk of reaction to medication, bleeding, infection, perforation and the patient is agreeable to proceed. Written consent to be obtained.

## 2014-09-26 NOTE — Patient Instructions (Addendum)
1. Please try anamantle (numbing medication) anorectally twice daily. I sent RX to [place] because they can make it for cheaper usually.  2. Start [place], which is nitroglycerin for anal fissures. Apply a one inch strip to a gloved finger (so you do not absorb the nitroglycerin which will cause headache) and insert anally twice a day. Alternate with the other rectal cream. RX sent to [facility]..  3. Use a diaper rash cream to buttocks twice daily for rash. 4. Colonoscopy as scheduled. See separate instructions.  5. If you cannot tolerate pain, develop fever, go back to ER and request admission.

## 2014-09-26 NOTE — Patient Instructions (Signed)
Andrew Kline  09/26/2014     '@PREFPERIOPPHARMACY'$ @   Your procedure is scheduled on 09/30/2014  Report to [name] at  98  A.M.  Call this number if you have problems the morning of surgery:  [phone]   Remember:  Do not eat food or drink liquids after midnight.  Take these medicines the morning of surgery with A SIP OF WATER  dilaudid   Do not wear jewelry, make-up or nail polish.  Do not wear lotions, powders, or perfumes.    Do not shave 48 hours prior to surgery.  Men may shave face and neck.  Do not bring valuables to the hospital.  [facility] is not responsible for any belongings or valuables.  Contacts, dentures or bridgework may not be worn into surgery.  Leave your suitcase in the car.  After surgery it may be brought to your room.  For patients admitted to the hospital, discharge time will be determined by your treatment team.  Patients discharged the day of surgery will not be allowed to drive home.   Name and phone number of your driver:   family Special instructions:  Follow instructions from Dr Oneida Alar office.  Please read over the following fact sheets that you were given. Pain Booklet, Coughing and Deep Breathing, Surgical Site Infection Prevention, Anesthesia Post-op Instructions and Care and Recovery After Surgery      Colonoscopy A colonoscopy is an exam to look at the entire large intestine (colon). This exam can help find problems such as tumors, polyps, inflammation, and areas of bleeding. The exam takes about 1 hour.  LET [facility] CARE PROVIDER KNOW ABOUT:   Any allergies you have.  All medicines you are taking, including vitamins, herbs, eye drops, creams, and over-the-counter medicines.  Previous problems you or members of your family have had with the use of anesthetics.  Any blood disorders you have.  Previous surgeries you have had.  Medical conditions you have. RISKS AND COMPLICATIONS  Generally, this is a safe  procedure. However, as with any procedure, complications can occur. Possible complications include:  Bleeding.  Tearing or rupture of the colon wall.  Reaction to medicines given during the exam.  Infection (rare). BEFORE THE PROCEDURE   Ask your health care provider about changing or stopping your regular medicines.  You may be prescribed an oral bowel prep. This involves drinking a large amount of medicated liquid, starting the day before your procedure. The liquid will cause you to have multiple loose stools until your stool is almost clear or light green. This cleans out your colon in preparation for the procedure.  Do not eat or drink anything else once you have started the bowel prep, unless your health care provider tells you it is safe to do so.  Arrange for someone to drive you home after the procedure. PROCEDURE   You will be given medicine to help you relax (sedative).  You will lie on your side with your knees bent.  A long, flexible tube with a light and camera on the end (colonoscope) will be inserted through the rectum and into the colon. The camera sends video back to a computer screen as it moves through the colon. The colonoscope also releases carbon dioxide gas to inflate the colon. This helps your health care provider see the area better.  During the exam, your health care provider may take a small tissue sample (biopsy) to be examined under a microscope if  any abnormalities are found.  The exam is finished when the entire colon has been viewed. AFTER THE PROCEDURE   Do not drive for 24 hours after the exam.  You may have a small amount of blood in your stool.  You may pass moderate amounts of gas and have mild abdominal cramping or bloating. This is caused by the gas used to inflate your colon during the exam.  Ask when your test results will be ready and how you will get your results. Make sure you get your test results. Document Released: 04/15/2000  Document Revised: 02/06/2013 Document Reviewed: 12/24/2012 [facility] Patient Information 2015 [place], [place]. This information is not intended to replace advice given to you by your health care provider. Make sure you discuss any questions you have with your health care provider. PATIENT INSTRUCTIONS POST-ANESTHESIA  IMMEDIATELY FOLLOWING SURGERY:  Do not drive or operate machinery for the first twenty four hours after surgery.  Do not make any important decisions for twenty four hours after surgery or while taking narcotic pain medications or sedatives.  If you develop intractable nausea and vomiting or a severe headache please notify your doctor immediately.  FOLLOW-UP:  Please make an appointment with your surgeon as instructed. You do not need to follow up with anesthesia unless specifically instructed to do so.  WOUND CARE INSTRUCTIONS (if applicable):  Keep a dry clean dressing on the anesthesia/puncture wound site if there is drainage.  Once the wound has quit draining you may leave it open to air.  Generally you should leave the bandage intact for twenty four hours unless there is drainage.  If the epidural site drains for more than 36-48 hours please call the anesthesia department.  QUESTIONS?:  Please feel free to call your physician or the hospital operator if you have any questions, and they will be happy to assist you.

## 2014-09-30 ENCOUNTER — Ambulatory Visit (HOSPITAL_COMMUNITY): Payer: Medicaid Other | Admitting: Anesthesiology

## 2014-09-30 ENCOUNTER — Encounter (HOSPITAL_COMMUNITY)
Admission: RE | Disposition: A | Discharge status: Home / Self Care | Payer: Self-pay | Source: Ambulatory Visit | Attending: Gastroenterology

## 2014-09-30 ENCOUNTER — Ambulatory Visit (HOSPITAL_COMMUNITY)
Admission: RE | Admit: 2014-09-30 | Discharge: 2014-09-30 | Disposition: A | Discharge status: Home / Self Care | Payer: Medicaid Other | Source: Ambulatory Visit | Attending: Gastroenterology | Admitting: Gastroenterology

## 2014-09-30 ENCOUNTER — Encounter (HOSPITAL_COMMUNITY): Payer: Self-pay | Admitting: *Deleted

## 2014-09-30 DIAGNOSIS — M1990 Unspecified osteoarthritis, unspecified site: Secondary | ICD-10-CM | POA: Insufficient documentation

## 2014-09-30 DIAGNOSIS — Z905 Acquired absence of kidney: Secondary | ICD-10-CM | POA: Insufficient documentation

## 2014-09-30 DIAGNOSIS — Z79899 Other long term (current) drug therapy: Secondary | ICD-10-CM | POA: Insufficient documentation

## 2014-09-30 DIAGNOSIS — Z85528 Personal history of other malignant neoplasm of kidney: Secondary | ICD-10-CM | POA: Diagnosis not present

## 2014-09-30 DIAGNOSIS — K921 Melena: Secondary | ICD-10-CM | POA: Diagnosis not present

## 2014-09-30 DIAGNOSIS — K6289 Other specified diseases of anus and rectum: Secondary | ICD-10-CM | POA: Diagnosis not present

## 2014-09-30 DIAGNOSIS — K625 Hemorrhage of anus and rectum: Secondary | ICD-10-CM | POA: Insufficient documentation

## 2014-09-30 DIAGNOSIS — F17210 Nicotine dependence, cigarettes, uncomplicated: Secondary | ICD-10-CM | POA: Insufficient documentation

## 2014-09-30 HISTORY — PX: BIOPSY: SHX5522

## 2014-09-30 HISTORY — PX: FLEXIBLE SIGMOIDOSCOPY: SHX5431

## 2014-09-30 SURGERY — SIGMOIDOSCOPY, FLEXIBLE
Anesthesia: Monitor Anesthesia Care

## 2014-09-30 MED ORDER — PROPOFOL 10 MG/ML IV BOLUS
INTRAVENOUS | Status: AC
  Filled 2014-09-30: qty 20

## 2014-09-30 MED ORDER — FENTANYL CITRATE (PF) 250 MCG/5ML IJ SOLN
INTRAMUSCULAR | Status: DC | PRN
  Administered 2014-09-30 (×2): 50 ug via INTRAVENOUS

## 2014-09-30 MED ORDER — FENTANYL CITRATE (PF) 100 MCG/2ML IJ SOLN
INTRAMUSCULAR | Status: AC
  Filled 2014-09-30: qty 2

## 2014-09-30 MED ORDER — MIDAZOLAM HCL 5 MG/5ML IJ SOLN
INTRAMUSCULAR | Status: DC | PRN
  Administered 2014-09-30: 0.5 mg via INTRAVENOUS

## 2014-09-30 MED ORDER — STERILE WATER FOR IRRIGATION IR SOLN
Status: DC | PRN
  Administered 2014-09-30: 1000 mL

## 2014-09-30 MED ORDER — HYDROMORPHONE HCL 2 MG PO TABS: 2.0000 mg | ORAL_TABLET | ORAL | Status: DC | PRN

## 2014-09-30 MED ORDER — ONDANSETRON HCL 4 MG/2ML IJ SOLN
4.0000 mg | Freq: Once | INTRAMUSCULAR | Status: AC
  Administered 2014-09-30: 4 mg via INTRAVENOUS

## 2014-09-30 MED ORDER — AMITRIPTYLINE HCL 10 MG PO TABS: ORAL_TABLET | ORAL | Status: DC

## 2014-09-30 MED ORDER — FENTANYL CITRATE (PF) 100 MCG/2ML IJ SOLN: 25.0000 ug | Freq: Once | INTRAMUSCULAR | Status: DC

## 2014-09-30 MED ORDER — FENTANYL CITRATE (PF) 100 MCG/2ML IJ SOLN
50.0000 ug | Freq: Once | INTRAMUSCULAR | Status: AC
  Administered 2014-09-30: 50 ug via INTRAVENOUS

## 2014-09-30 MED ORDER — LACTATED RINGERS IV SOLN
INTRAVENOUS | Status: DC
  Administered 2014-09-30: 11:00:00 via INTRAVENOUS

## 2014-09-30 MED ORDER — FENTANYL CITRATE (PF) 100 MCG/2ML IJ SOLN
25.0000 ug | INTRAMUSCULAR | Status: DC | PRN
  Administered 2014-09-30 (×2): 50 ug via INTRAVENOUS

## 2014-09-30 MED ORDER — MIDAZOLAM HCL 2 MG/2ML IJ SOLN
1.0000 mg | INTRAMUSCULAR | Status: DC | PRN
  Administered 2014-09-30: 2 mg via INTRAVENOUS

## 2014-09-30 MED ORDER — MIDAZOLAM HCL 2 MG/2ML IJ SOLN
INTRAMUSCULAR | Status: AC
  Filled 2014-09-30: qty 2

## 2014-09-30 MED ORDER — LIDOCAINE HCL (PF) 1 % IJ SOLN
INTRAMUSCULAR | Status: AC
  Filled 2014-09-30: qty 5

## 2014-09-30 MED ORDER — FENTANYL CITRATE (PF) 100 MCG/2ML IJ SOLN
25.0000 ug | INTRAMUSCULAR | Status: AC
  Administered 2014-09-30 (×3): 25 ug via INTRAVENOUS

## 2014-09-30 MED ORDER — ONDANSETRON HCL 4 MG/2ML IJ SOLN: 4.0000 mg | Freq: Once | INTRAMUSCULAR | Status: DC | PRN

## 2014-09-30 MED ORDER — PROPOFOL INFUSION 10 MG/ML OPTIME
INTRAVENOUS | Status: DC | PRN
  Administered 2014-09-30: 13:00:00 via INTRAVENOUS
  Administered 2014-09-30: 125 ug/kg/min via INTRAVENOUS

## 2014-09-30 MED ORDER — ONDANSETRON HCL 4 MG/2ML IJ SOLN
INTRAMUSCULAR | Status: AC
  Filled 2014-09-30: qty 2

## 2014-09-30 SURGICAL SUPPLY — 20 items
ELECT REM PT RETURN 9FT ADLT (ELECTROSURGICAL)
ELECTRODE REM PT RTRN 9FT ADLT (ELECTROSURGICAL) IMPLANT
FCP BXJMBJMB 240X2.8X (CUTTING FORCEPS)
FLOOR PAD 36X40 (MISCELLANEOUS) ×3
FORCEPS BIOP RAD 4 LRG CAP 4 (CUTTING FORCEPS) ×3 IMPLANT
FORCEPS BIOP RJ4 240 W/NDL (CUTTING FORCEPS)
FORCEPS BXJMBJMB 240X2.8X (CUTTING FORCEPS) IMPLANT
FORMALIN 10 PREFIL 20ML (MISCELLANEOUS) ×3 IMPLANT
INJECTOR/SNARE I SNARE (MISCELLANEOUS) IMPLANT
KIT ENDO PROCEDURE PEN (KITS) ×3 IMPLANT
MANIFOLD NEPTUNE II (INSTRUMENTS) ×3 IMPLANT
NEEDLE SCLEROTHERAPY 25GX240 (NEEDLE) IMPLANT
PAD FLOOR 36X40 (MISCELLANEOUS) ×1 IMPLANT
PROBE APC STR FIRE (PROBE) IMPLANT
PROBE INJECTION GOLD (MISCELLANEOUS)
PROBE INJECTION GOLD 7FR (MISCELLANEOUS) IMPLANT
SNARE SHORT THROW 13M SML OVAL (MISCELLANEOUS) IMPLANT
TRAP SPECIMEN MUCOUS 40CC (MISCELLANEOUS) IMPLANT
TUBING IRRIGATION ENDOGATOR (MISCELLANEOUS) ×3 IMPLANT
WATER STERILE IRR 1000ML POUR (IV SOLUTION) ×3 IMPLANT

## 2014-09-30 NOTE — Discharge Instructions (Signed)
YOU have a MASS IN YOUR RECTUM. Your prep was not good. YOU WILL NEED TO COME BACK.   Use ELAVIL 2 OR 3 AR BEDTIME TO HELP WITH PAIN.  USE DILAUDID AS NEEDED FOR PAIN.  YOUR BIOPSY RESULTS WILL BE BACK IN 3 DAYS.  YOU NEED A FULL COLONOSCOPY IN 3-6 MOS.  YOU NEED TO SEE A CANCER DOCTOR AND A SURGEON.   ENDOSCOPY Care After Read the instructions outlined below and refer to this sheet in the next week. These discharge instructions provide you with general information on caring for yourself after you leave the hospital. While your treatment has been planned according to the most current medical practices available, unavoidable complications occasionally occur. If you have any problems or questions after discharge, call DR. FIELDS, [phone].  ACTIVITY  You may resume your regular activity, but move at a slower pace for the next 24 hours.   Take frequent rest periods for the next 24 hours.   Walking will help get rid of the air and reduce the bloated feeling in your belly (abdomen).   No driving for 24 hours (because of the medicine (anesthesia) used during the test).   You may shower.   Do not sign any important legal documents or operate any machinery for 24 hours (because of the anesthesia used during the test).    NUTRITION  Drink plenty of fluids.   You may resume your normal diet as instructed by your doctor.   Begin with a light meal and progress to your normal diet. Heavy or fried foods are harder to digest and may make you feel sick to your stomach (nauseated).   Avoid alcoholic beverages for 24 hours or as instructed.    MEDICATIONS  You may resume your normal medications.   WHAT YOU CAN EXPECT TODAY  Some feelings of bloating in the abdomen.   Passage of more gas than usual.   Spotting of blood in your stool or on the toilet paper  .  IF YOU HADBIOPSIES TAKEN  DURING THE SIGMOIDOSCOPY/UPPER ENDOSCOPY:  Eat a soft diet IF YOU HAVE NAUSEA, BLOATING,  ABDOMINAL PAIN, OR VOMITING.    FINDING OUT THE RESULTS OF YOUR TEST Not all test results are available during your visit. DR. Oneida Alar WILL CALL YOU WITHIN 14 DAYS OF YOUR PROCEDUE WITH YOUR RESULTS. Do not assume everything is normal if you have not heard from DR. FIELDS, CALL HER OFFICE AT [phone].  SEEK IMMEDIATE MEDICAL ATTENTION AND CALL THE OFFICE: [phone] IF:  You have more than a spotting of blood in your stool.   Your belly is swollen (abdominal distention).   You are nauseated or vomiting.   You have a temperature over 101F.   You have abdominal pain or discomfort that is severe or gets worse throughout the day.

## 2014-09-30 NOTE — H&P (Signed)
  Primary Care Physician:  No PCP Per Patient Primary Gastroenterologist:  Dr. Oneida Alar  Pre-Procedure History & Physical: HPI:  Andrew Kline is a 49 y.o. male here for BRBPR/ectal pain-SLIGHTLY abnl rectal wall on CT MAY 2016x2.  Past Medical History  Diagnosis Date  . Rectal pain   . Renal cell cancer     2012.   . Arthritis     Past Surgical History  Procedure Laterality Date  . Hypospadias correction    . Laparoscopic partial nephrectomy  2012    right side  . Knee surgery Left     arthroscopy    Prior to Admission medications   Medication Sig Start Date End Date Taking? Authorizing Provider  docusate sodium (COLACE) 100 MG capsule Take 1 capsule (100 mg total) by mouth every 12 (twelve) hours. 09/25/14  Yes Christopher Lawyer, PA-C  HYDROmorphone (DILAUDID) 2 MG tablet Take 1 tablet (2 mg total) by mouth every 4 (four) hours as needed for severe pain. 09/25/14  Yes Christopher Lawyer, PA-C  lidocaine-hydrocortisone (ANAMANTLE) 3-1 % KIT Place 1 application rectally 2 (two) times daily. For two weeks, alternate with Rectiv. 09/26/14  Yes Mahala Menghini, PA-C  Nitroglycerin (RECTIV) 0.4 % OINT Apply one inch of medication on a gloved finger and insert into anal canal twice daily for 3 weeks. 09/26/14  Yes Mahala Menghini, PA-C    Allergies as of 09/26/2014  . (No Known Allergies)    Family History  Problem Relation Age of Onset  . Lung cancer Maternal Grandfather   . Colon cancer Maternal Uncle     age 37  . Diabetes Other   . Healthy Mother     History   Social History  . Marital Status: Divorced    Spouse Name: N/A  . Number of Children: 2  . Years of Education: N/A   Occupational History  . retail    Social History Main Topics  . Smoking status: Current Every Day Smoker -- 1.50 packs/day for 30 years    Types: Cigarettes  . Smokeless tobacco: Not on file     Comment: a little over a pack daily  . Alcohol Use: 0.0 oz/week    0 Standard drinks or equivalent  per week     Comment: Occ  . Drug Use: No  . Sexual Activity: Yes    Birth Control/ Protection: None   Other Topics Concern  . Not on file   Social History Narrative    Review of Systems: See HPI, otherwise negative ROS   Physical Exam: BP 138/72 mmHg  Temp(Src) 97.9 F (36.6 C) (Oral)  Resp 24  Ht $R'5\' 5"'ZG$  (1.651 m)  Wt 200 lb (90.719 kg)  BMI 33.28 kg/m2  SpO2 99% General:   Alert,  pleasant and cooperative in NAD Head:  Normocephalic and atraumatic. Neck:  Supple; Lungs:  Clear throughout to auscultation.    Heart:  Regular rate and rhythm. Abdomen:  Soft, nontender and nondistended. Normal bowel sounds, without guarding, and without rebound.   Neurologic:  Alert and  oriented x4;  grossly normal neurologically.  Impression/Plan:   BRBPR/Rectal pain-SLIGHTLY abnl rectal wall on CT MAY 2016x2.  PLAN: TCS TODAY

## 2014-09-30 NOTE — Transfer of Care (Signed)
Immediate Anesthesia Transfer of Care Note  Patient: Andrew Kline  Procedure(s) Performed: Procedure(s) with comments: FLEXIBLE SIGMOIDOSCOPY (N/A) BIOPSY (N/A) - Anal Canal  Patient Location: PACU  Anesthesia Type:MAC  Level of Consciousness: awake  Airway & Oxygen Therapy: Patient Spontanous Breathing and Patient connected to face mask oxygen  Post-op Assessment: Report given to RN  Post vital signs: Reviewed and stable  Last Vitals:  Filed Vitals:   09/30/14 1210  BP: 128/63  Temp:   Resp: 18    Complications: No apparent anesthesia complications

## 2014-09-30 NOTE — Anesthesia Preprocedure Evaluation (Signed)
Anesthesia Evaluation  Patient identified by MRN, date of birth, ID band Patient awake    Reviewed: Allergy & Precautions, NPO status , Patient's Chart, lab work & pertinent test results  Airway Mallampati: II  TM Distance: >3 FB     Dental  (+) Edentulous Upper, Edentulous Lower   Pulmonary Current Smoker,  Lung nodules on CT breath sounds clear to auscultation        Cardiovascular negative cardio ROS  Rhythm:Regular Rate:Normal     Neuro/Psych    GI/Hepatic   Endo/Other    Renal/GU Renal disease (partial neph for renal cell CA)     Musculoskeletal   Abdominal   Peds  Hematology   Anesthesia Other Findings   Reproductive/Obstetrics                             Anesthesia Physical Anesthesia Plan  ASA: III  Anesthesia Plan: MAC   Post-op Pain Management:    Induction: Intravenous  Airway Management Planned: Simple Face Mask  Additional Equipment:   Intra-op Plan:   Post-operative Plan:   Informed Consent: I have reviewed the patients History and Physical, chart, labs and discussed the procedure including the risks, benefits and alternatives for the proposed anesthesia with the patient or authorized representative who has indicated his/her understanding and acceptance.     Plan Discussed with:   Anesthesia Plan Comments: (Possible Gen/LMA if MAC is inadequate. Pt is in extreme pain.)        Anesthesia Quick Evaluation

## 2014-09-30 NOTE — Progress Notes (Signed)
REVIEWED-NO ADDITIONAL RECOMMENDATIONS. 

## 2014-09-30 NOTE — Anesthesia Postprocedure Evaluation (Signed)
  Anesthesia Post-op Note  Patient: Andrew Kline  Procedure(s) Performed: Procedure(s) with comments: FLEXIBLE SIGMOIDOSCOPY (N/A) BIOPSY (N/A) - Anal Canal  Patient Location: PACU  Anesthesia Type:MAC  Level of Consciousness: awake, alert  and oriented  Airway and Oxygen Therapy: Patient Spontanous Breathing and Patient connected to face mask oxygen  Post-op Pain: moderate  Post-op Assessment: Post-op Vital signs reviewed, Patient's Cardiovascular Status Stable, Respiratory Function Stable, Patent Airway and No signs of Nausea or vomiting  Post-op Vital Signs: Reviewed and stable  Last Vitals:  Filed Vitals:   09/30/14 1248  BP:   Temp:   Resp: 14    Complications: No apparent anesthesia complications

## 2014-10-01 ENCOUNTER — Encounter (HOSPITAL_COMMUNITY): Payer: Self-pay | Admitting: Gastroenterology

## 2014-10-01 ENCOUNTER — Telehealth: Payer: Self-pay | Admitting: Gastroenterology

## 2014-10-01 ENCOUNTER — Other Ambulatory Visit: Payer: Self-pay

## 2014-10-01 DIAGNOSIS — K6289 Other specified diseases of anus and rectum: Secondary | ICD-10-CM

## 2014-10-01 DIAGNOSIS — C7800 Secondary malignant neoplasm of unspecified lung: Secondary | ICD-10-CM

## 2014-10-01 NOTE — Telephone Encounter (Signed)
Pt needs REFERRAL TO ONCOLOGY FOR ANORECTAL MASS/POSSIBLE LUNG METS.

## 2014-10-01 NOTE — Telephone Encounter (Signed)
Referral to [facility] done

## 2014-10-01 NOTE — Op Note (Signed)
[facility] [address] [place], [address]   FLEX SIGMOIDOSCOPY PROCEDURE REPORT  PATIENT: Andrew Kline, Andrew Kline  MR#: [id number] BIRTHDATE: January 05, 1966 , 49  yrs. old GENDER: male ENDOSCOPIST: Danie Binder, MD REFERRED BY: PROCEDURE DATE:  09/30/2014 PROCEDURE:   Sigmoidoscopy with biopsy INDICATIONS:RECTAL BLEEDING AND PAIN. MEDICATIONS: Monitored anesthesia care  DESCRIPTION OF PROCEDURE:    Physical exam was performed.  Informed consent was obtained from the patient after explaining the benefits, risks, and alternatives to procedure.  The patient was connected to monitor and placed in left lateral position. Continuous oxygen was provided by nasal cannula and IV medicine administered through an indwelling cannula.  After administration of sedation and rectal exam, the patients rectum was intubated and the     colonoscope was advanced under direct visualization to the [place].  The scope was removed slowly by carefully examining the color, texture, anatomy, and integrity mucosa on the way out. The patient was recovered in endoscopy and discharged home in satisfactory condition.    COLON FINDINGS: A near circumferential mass with friable surfaces was found.  Multiple biopsies were performed using cold forceps.   PREP QUALITY: The overall prep quality was inadequate.COMPLICATIONS: None  ENDOSCOPIC IMPRESSION: Near circumferential mass appears to begin inthe nala canal and EXTEND INTO DISTAL RECTUM  RECOMMENDATIONS: Use ELAVIL 2 OR 3 AT BEDTIME TO HELP WITH PAIN. USE DILAUDID AS NEEDED FOR PAIN. AWAIT BIOPSY RESULTS. FULL COLONOSCOPY IN 3-6 MOS. REFER TO ONCOLOGY.      _______________________________ eSignedDanie Binder, MD 2014/10/23 1:56 PM   CPT CODES: ICD CODES:  The ICD and CPT codes recommended by this software are interpretations from the data that the clinical staff has captured with the software.  The verification of the  translation of this report to the ICD and CPT codes and modifiers is the sole responsibility of the health care institution and practicing physician where this report was generated.  [employer] will not be held responsible for the validity of the ICD and CPT codes included on this report.  AMA assumes no liability for data contained or not contained herein. CPT is a Designer, television/film set of the [employer].

## 2014-10-02 NOTE — Telephone Encounter (Signed)
PLEASE CALL PT. HIS BIOPSIES SHOW RECTAL CANCER. HE SHOULD SEE THE CANCER DOCTOR TO DISCUSS TREATMENT OPTIONS.

## 2014-10-03 NOTE — Telephone Encounter (Signed)
Pt called back and is aware of results. Pt is also aware of Oncology appt.

## 2014-10-03 NOTE — Telephone Encounter (Signed)
LMOM to call.

## 2014-10-06 ENCOUNTER — Encounter (HOSPITAL_BASED_OUTPATIENT_CLINIC_OR_DEPARTMENT_OTHER): Payer: Medicaid Other | Admitting: Hematology & Oncology

## 2014-10-06 ENCOUNTER — Encounter (HOSPITAL_COMMUNITY): Payer: Self-pay | Admitting: Hematology & Oncology

## 2014-10-06 ENCOUNTER — Encounter (HOSPITAL_COMMUNITY): Payer: Medicaid Other

## 2014-10-06 ENCOUNTER — Encounter (HOSPITAL_COMMUNITY): Payer: Medicaid Other | Attending: Hematology & Oncology

## 2014-10-06 VITALS — BP 117/95 | HR 90 | Temp 98.5°F | Resp 20 | Ht 65.0 in | Wt 207.0 lb

## 2014-10-06 DIAGNOSIS — C20 Malignant neoplasm of rectum: Secondary | ICD-10-CM | POA: Diagnosis present

## 2014-10-06 DIAGNOSIS — R918 Other nonspecific abnormal finding of lung field: Secondary | ICD-10-CM

## 2014-10-06 LAB — IRON AND TIBC
Iron: 58 ug/dL (ref 45–182)
SATURATION RATIOS: 15 % — AB (ref 17.9–39.5)
TIBC: 398 ug/dL (ref 250–450)
UIBC: 340 ug/dL

## 2014-10-06 LAB — FERRITIN: FERRITIN: 21 ng/mL — AB (ref 24–336)

## 2014-10-06 MED ORDER — MORPHINE SULFATE ER 15 MG PO TBCR: 15.0000 mg | EXTENDED_RELEASE_TABLET | Freq: Two times a day (BID) | ORAL | Status: DC

## 2014-10-06 NOTE — Progress Notes (Signed)
Andrew Kline presented for labwork. Labs per MD order drawn via Peripheral Line 23 gauge needle inserted in right AC   Good blood return present. Procedure without incident.  Needle removed intact. Patient tolerated procedure well.

## 2014-10-06 NOTE — Patient Instructions (Signed)
[place] at [facility] Discharge Instructions  RECOMMENDATIONS MADE BY THE CONSULTANT AND ANY TEST RESULTS WILL BE SENT TO YOUR REFERRING PHYSICIAN.  Exam and discussion by Dr. [name]. Will check some blood work today and do CT scan of your chest. Will see you back after scans to discuss results.  Thank you for choosing [place] at [facility] to provide your oncology and hematology care.  To afford each patient quality time with our provider, please arrive at least 15 minutes before your scheduled appointment time.    You need to re-schedule your appointment should you arrive 10 or more minutes late.  We strive to give you quality time with our providers, and arriving late affects you and other patients whose appointments are after yours.  Also, if you no show three or more times for appointments you may be dismissed from the clinic at the providers discretion.     Again, thank you for choosing [facility].  Our hope is that these requests will decrease the amount of time that you wait before being seen by our physicians.       _____________________________________________________________  Should you have questions after your visit to [facility], please contact our office at [phone] between the hours of 8:30 a.m. and 4:30 p.m.  Voicemails left after 4:30 p.m. will not be returned until the following business day.  For prescription refill requests, have your pharmacy contact our office.

## 2014-10-06 NOTE — Progress Notes (Signed)
 [facility] [place] CONSULT NOTE  Patient Care Team: No Pcp Per Patient as PCP - General (General Practice) Sandi L Fields, MD as Consulting Physician (Gastroenterology)  CHIEF COMPLAINTS/PURPOSE OF CONSULTATION:  Adenocarcinoma of the Rectum  HISTORY OF PRESENTING ILLNESS:  Andrew Kline 49 y.o. male is here because of newly diagnosed rectal cancer.  He is here alone today. He goes by Bill. He wasn't able to have a complete colonoscopy done because the medication "didn't clean him out fully".  He has lost 20-25 lbs. in the last 2 months. He still eats but doesn't eat nearly as much as he used to.  Painful bowel movements began gradually. He says "it is an itch up there" like "a red hot poker stabbin him". Some days he can sit on a pack of ice and alleviate it. Other days he must take a scalding hot bath then jump on a bag of ice then back in the hot tub to alleviate the pain. He admits he has poor pain control and has difficulty sitting.  Blood in stool is a recent occurrence.   He presented to the emergency room at [place] on 09/20/2014 with rectal pain. CT of the pelvis was performed that showed Questionable asymmetric wall thickening or mass along the right wall of the anus/ rectum.He was stable and discharged with Gen. surgery follow-up and GI follow-up. Prior to being seen however he repairs and it to the emergency department on 5/26 for worsening rectal pain. He was seen by GI on 5/27. He underwent a flexible sigmoidoscopy on 10/01/2014 that showed a large near circumferential mass starting in the anus and extending into the distal rectum. Biopsy results were "highly suspicious for invasive adenocarcinoma."  He is up and down at night. He is unable to sleep due to discomfort, He has pills that he takes to help him sleep. He takes Dilaudid for the pain. He takes one around 1 pm each day and another at night. He is getting paperwork in for Medicaid. Applied for  [employer] disability.  He is normally active. He cooks, cleans, and "does windows".  Partial nephrectomy 2012, in [place], [place] done at [facility]. Laparoscopic. Surgeon Dr. Harrison. Had it done there because that is where he is from and he was currently living there. He states he was diagnosed with early RCC.   He is here today for ongoing follow-up and recommendations.  MEDICAL HISTORY:  Past Medical History  Diagnosis Date  . Rectal pain   . Renal cell cancer     2012.   . Arthritis     SURGICAL HISTORY: Past Surgical History  Procedure Laterality Date  . Hypospadias correction    . Laparoscopic partial nephrectomy  2012    right side  . Knee surgery Left     arthroscopy  . Flexible sigmoidoscopy N/A 09/30/2014    Procedure: FLEXIBLE SIGMOIDOSCOPY;  Surgeon: Sandi L Fields, MD;  Location: [facility];  Service: Endoscopy;  Laterality: N/A;  . Esophageal biopsy N/A 09/30/2014    Procedure: BIOPSY;  Surgeon: Sandi L Fields, MD;  Location: [facility];  Service: Endoscopy;  Laterality: N/A;  Anal Canal    SOCIAL HISTORY: History   Social History  . Marital Status: Divorced    Spouse Name: N/A  . Number of Children: 2  . Years of Education: N/A   Occupational History  . retail    Social History Main Topics  . Smoking status: Current Every Day Smoker --   1.00 packs/day for 30 years    Types: Cigarettes  . Smokeless tobacco: Not on file     Comment: a little over a pack daily  . Alcohol Use: 0.0 oz/week    0 Standard drinks or equivalent per week     Comment: Occ  . Drug Use: No  . Sexual Activity: Yes    Birth Control/ Protection: None   Other Topics Concern  . Not on file   Social History Narrative  He is from [place], [place]. Moved here in March. Smoker, 1 ppd. Was 1.5 ppd. ETOH, none. 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 He is divorced. Lives with fiance. 2  children. 24 and 22 yo. No grandchildren.  FAMILY HISTORY: Family History  Problem Relation Age of Onset  . Lung cancer Maternal Grandfather   . Colon cancer Maternal Uncle     age 59  . Diabetes Other   . Healthy Mother    has no family status information on file.   Mother living, 73 yo. Still working as a Paramedic. Father living, 69-70 yo. 1 brother, 1 sister, 1 step sister. Maternal uncle has colon cancer. He is married to a GI nurse. So he has people to go to with questions and is informed on what is happening. History of diabetes in the family. No history of heart disease in the family.  ALLERGIES:  has No Known Allergies.  MEDICATIONS:  Current Outpatient Prescriptions  Medication Sig Dispense Refill  . amitriptyline (ELAVIL) 10 MG tablet 2-3 po qhs 60 tablet 5  . docusate sodium (COLACE) 100 MG capsule Take 1 capsule (100 mg total) by mouth every 12 (twelve) hours. 60 capsule 0  . HYDROmorphone (DILAUDID) 2 MG tablet Take 1 tablet (2 mg total) by mouth every 4 (four) hours as needed for severe pain. 60 tablet 0  . lidocaine-hydrocortisone (ANAMANTLE) 3-1 % KIT Place 1 application rectally 2 (two) times daily. For two weeks, alternate with Rectiv. 28 each 1  . Nitroglycerin (RECTIV) 0.4 % OINT Apply one inch of medication on a gloved finger and insert into anal canal twice daily for 3 weeks. 30 g 1   No current facility-administered medications for this visit.    Review of Systems  Constitutional: Positive for malaise/fatigue and diaphoresis.       He cannot sit flat on a chair due to pain. Unable to sleep due to rectal discomfort.  Respiratory: Positive for cough. Negative for sputum production.        Painful coughing episodes began within the last 7 weeks and it hurts because it irritates his rectum.  Gastrointestinal: Positive for blood in stool.  Psychiatric/Behavioral: The patient is not nervous/anxious.    14 point ROS was done and is otherwise as detailed  above or in HPI   PHYSICAL EXAMINATION:  ECOG PERFORMANCE STATUS: 1 - Symptomatic but completely ambulatory  Filed Vitals:   10/06/14 1222  BP: 117/95  Pulse: 90  Temp: 98.5 F (36.9 C)  Resp: 20   Filed Weights   10/06/14 1222  Weight: 207 lb (93.895 kg)     Physical Exam  Constitutional: He is oriented to person, place, and time and well-developed, well-nourished, and in no distress.  His walking is affected by the rectal pain, as well as arthritis.  HENT:  Head: Normocephalic and atraumatic.  Nose: Nose normal.  Mouth/Throat: Oropharynx is clear and moist. No   oropharyngeal exudate.  Dentures on top.  Eyes: Conjunctivae and EOM are normal. Pupils are equal, round, and reactive to light. Right eye exhibits no discharge. Left eye exhibits no discharge. No scleral icterus.  Neck: Normal range of motion. Neck supple. No tracheal deviation present. No thyromegaly present.  Cardiovascular: Normal rate, regular rhythm and normal heart sounds.  Exam reveals no gallop and no friction rub.   No murmur heard. Pulmonary/Chest: Effort normal and breath sounds normal. He has no wheezes. He has no rales.  Abdominal: Soft. Bowel sounds are normal. He exhibits no distension and no mass. There is no tenderness. There is no rebound and no guarding.  Genitourinary:  NOTE rectal exam was deferred today secondary to significant patient discomfort  Musculoskeletal: Normal range of motion. He exhibits no edema.  Lymphadenopathy:    He has no cervical adenopathy.  Neurological: He is alert and oriented to person, place, and time. He has normal reflexes. No cranial nerve deficit. Gait normal. Coordination normal.  Skin: Skin is warm and dry. No rash noted.  Psychiatric: Mood, memory, affect and judgment normal.  Nursing note and vitals reviewed.    LABORATORY DATA:  I have reviewed the data as listed Lab Results  Component Value Date   WBC 9.1 09/24/2014   HGB 15.4 09/24/2014   HCT 45.6  09/24/2014   MCV 91.4 09/24/2014   PLT 294 09/24/2014   Results for ZAMIER, EGGEBRECHT (MRN [id number]) as of 10/14/2014 08:01  Ref. Range 09/24/2014 20:04  Sodium Latest Ref Range: 135-145 mmol/L 137  Potassium Latest Ref Range: 3.5-5.1 mmol/L 3.8  Chloride Latest Ref Range: 101-111 mmol/L 106  CO2 Latest Ref Range: 22-32 mmol/L 24  BUN Latest Ref Range: 6-20 mg/dL 10  Creatinine Latest Ref Range: 0.61-1.24 mg/dL 0.80  Calcium Latest Ref Range: 8.9-10.3 mg/dL 8.5 (L)  EGFR (Non-African Amer.) Latest Ref Range: >60 mL/min >60  EGFR (African American) Latest Ref Range: >60 mL/min >60  Glucose Latest Ref Range: 65-99 mg/dL 118 (H)  Anion gap Latest Ref Range: 5-15  7  WBC Latest Ref Range: 4.0-10.5 K/uL 9.1  RBC Latest Ref Range: 4.22-5.81 MIL/uL 4.99  Hemoglobin Latest Ref Range: 13.0-17.0 g/dL 15.4  HCT Latest Ref Range: 39.0-52.0 % 45.6  MCV Latest Ref Range: 78.0-100.0 fL 91.4  MCH Latest Ref Range: 26.0-34.0 pg 30.9  MCHC Latest Ref Range: 30.0-36.0 g/dL 33.8  RDW Latest Ref Range: 11.5-15.5 % 12.9  Platelets Latest Ref Range: 150-400 K/uL 294  Neutrophils Latest Ref Range: 43-77 % 58  Lymphocytes Latest Ref Range: 12-46 % 27  Monocytes Relative Latest Ref Range: 3-12 % 9  Eosinophil Latest Ref Range: 0-5 % 6 (H)  Basophil Latest Ref Range: 0-1 % 0  NEUT# Latest Ref Range: 1.7-7.7 K/uL 5.2  Lymphocyte # Latest Ref Range: 0.7-4.0 K/uL 2.5  Monocyte # Latest Ref Range: 0.1-1.0 K/uL 0.9  Eosinophils Absolute Latest Ref Range: 0.0-0.7 K/uL 0.5  Basophils Absolute Latest Ref Range: 0.0-0.1 K/uL 0.0    RADIOGRAPHIC STUDIES: I have personally reviewed the radiological images as listed and agreed with the findings in the report.   CLINICAL DATA: Severe rectal pain. Bright red blood in stool.  EXAM: CT PELVIS WITH CONTRAST  TECHNIQUE: Multidetector CT imaging of the pelvis was performed using the standard protocol following the bolus administration of  intravenous contrast.  CONTRAST: 15m OMNIPAQUE IOHEXOL 300 MG/ML SOLN, 1023mOMNIPAQUE IOHEXOL 300 MG/ML SOLN  COMPARISON: None.  FINDINGS: There is questionable asymmetric wall thickening along the right  side of the anus/rectum on image 42. Recommend correlation with digital rectal exam. Moderate stool in the visualized colon. Visualized small bowel is decompressed. Appendix is normal.  Urinary bladder unremarkable. No free fluid, free air or adenopathy.  No acute bony abnormality.  IMPRESSION: Questionable asymmetric wall thickening or mass along the right wall of the anus/ rectum. Recommend correlation with digital rectal exam.   Electronically Signed  By: Rolm Baptise M.D.  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 In advised him we need to obtain a CT of the chest as I am suspicious she may have metastatic disease to the lung.  His pathology unfortunately is inconclusive for invasive disease. However based upon his overall clinical presentation, including his pain and CT of the chest I feel quite sure he has invasive disease. I may likely recommend with a lung biopsy to confirm stage IV disease and then at that point do not feel he needs another rectal biopsy. I am also considering this could be an adenocarcinoma of the anal canal that extends into the rectum but I think this is going to be difficult to distinguish. I will discuss his flexible sigmoidoscopy with Dr. Oneida Alar for further clarification.  Would like to obtain his records from his previous partial nephrectomy 2012, in [place], [place] done at [facility].   We will order blood work today including a baseline CEA. We will also check his iron levels. I have recommended a  long acting pain medication in order to better control his pain until we are able to move forward with definitive therapy. He will need radiation given his significant discomfort. Even if he has involvement in the chest palliative radiation will be very beneficial.   He will need a Port-A-Cath.   I recommended he increase his amitriptyline dose which will help her sleep and can attentionally help with his pain is well. Additional recommendations will follow as we proceed with the above studies.   Orders Placed This Encounter  Procedures  . CT Chest W Contrast    Collie Siad, order in sys, esigned, Creat 0.8 5/25.16, self pay, nbs    Standing Status: Future     Number of Occurrences: 1     Standing Expiration Date: 10/06/2015    Order Specific Question:  Reason for Exam (SYMPTOM  OR DIAGNOSIS REQUIRED)    Answer:  newly diagnosed rectal cancer, lung nodules    Order Specific Question:  Preferred imaging location?    Answer:  [facility]  . Ferritin  . Iron and TIBC  . CEA    All questions were answered. The patient knows to call the clinic with any problems, questions or concerns.  This note was electronically signed.    This document serves as a record of services personally performed by Ancil Linsey, MD. It was created on her behalf by Arlyce Harman, a trained medical scribe. The creation of this record is based on the scribe's personal observations and the provider's statements to them. This document has been checked and approved by the attending provider.  I have reviewed the above documentation for accuracy and completeness, and I agree with the above.  [name], MD

## 2014-10-07 LAB — CEA: CEA: 4.3 ng/mL (ref 0.0–4.7)

## 2014-10-09 ENCOUNTER — Ambulatory Visit (HOSPITAL_COMMUNITY)
Admission: RE | Admit: 2014-10-09 | Discharge: 2014-10-09 | Disposition: A | Discharge status: Home / Self Care | Payer: Medicaid Other | Source: Ambulatory Visit | Attending: Hematology & Oncology | Admitting: Hematology & Oncology

## 2014-10-09 DIAGNOSIS — C20 Malignant neoplasm of rectum: Secondary | ICD-10-CM

## 2014-10-09 DIAGNOSIS — R918 Other nonspecific abnormal finding of lung field: Secondary | ICD-10-CM | POA: Insufficient documentation

## 2014-10-09 MED ORDER — IOHEXOL 300 MG/ML  SOLN
75.0000 mL | Freq: Once | INTRAMUSCULAR | Status: AC | PRN
  Administered 2014-10-09: 75 mL via INTRAVENOUS

## 2014-10-10 MED ORDER — HYDROMORPHONE HCL 2 MG PO TABS: 2.0000 mg | ORAL_TABLET | ORAL | Status: DC | PRN

## 2014-10-13 NOTE — Progress Notes (Signed)
cc'ed to referring doctor °

## 2014-10-14 ENCOUNTER — Other Ambulatory Visit (HOSPITAL_COMMUNITY): Payer: Self-pay | Admitting: Hematology & Oncology

## 2014-10-14 DIAGNOSIS — R918 Other nonspecific abnormal finding of lung field: Secondary | ICD-10-CM

## 2014-10-17 ENCOUNTER — Encounter (HOSPITAL_COMMUNITY)
Admission: RE | Disposition: A | Discharge status: Home / Self Care | Payer: Self-pay | Source: Ambulatory Visit | Attending: Gastroenterology

## 2014-10-17 ENCOUNTER — Encounter (HOSPITAL_COMMUNITY): Payer: Self-pay | Admitting: Lab

## 2014-10-17 ENCOUNTER — Encounter (HOSPITAL_BASED_OUTPATIENT_CLINIC_OR_DEPARTMENT_OTHER): Payer: Medicaid Other | Admitting: Hematology & Oncology

## 2014-10-17 ENCOUNTER — Other Ambulatory Visit: Payer: Self-pay

## 2014-10-17 ENCOUNTER — Encounter (HOSPITAL_COMMUNITY): Payer: Self-pay | Admitting: *Deleted

## 2014-10-17 ENCOUNTER — Ambulatory Visit (HOSPITAL_COMMUNITY)
Admission: RE | Admit: 2014-10-17 | Discharge: 2014-10-17 | Disposition: A | Discharge status: Home / Self Care | Payer: Medicaid Other | Source: Ambulatory Visit | Attending: Gastroenterology | Admitting: Gastroenterology

## 2014-10-17 ENCOUNTER — Telehealth: Payer: Self-pay | Admitting: Gastroenterology

## 2014-10-17 VITALS — BP 134/73 | HR 91 | Temp 98.2°F | Resp 18 | Wt 207.2 lb

## 2014-10-17 DIAGNOSIS — Z833 Family history of diabetes mellitus: Secondary | ICD-10-CM | POA: Diagnosis not present

## 2014-10-17 DIAGNOSIS — Z720 Tobacco use: Secondary | ICD-10-CM

## 2014-10-17 DIAGNOSIS — C801 Malignant (primary) neoplasm, unspecified: Secondary | ICD-10-CM

## 2014-10-17 DIAGNOSIS — Z85528 Personal history of other malignant neoplasm of kidney: Secondary | ICD-10-CM | POA: Insufficient documentation

## 2014-10-17 DIAGNOSIS — Z801 Family history of malignant neoplasm of trachea, bronchus and lung: Secondary | ICD-10-CM | POA: Diagnosis not present

## 2014-10-17 DIAGNOSIS — Z79899 Other long term (current) drug therapy: Secondary | ICD-10-CM | POA: Insufficient documentation

## 2014-10-17 DIAGNOSIS — R198 Other specified symptoms and signs involving the digestive system and abdomen: Secondary | ICD-10-CM

## 2014-10-17 DIAGNOSIS — K6289 Other specified diseases of anus and rectum: Secondary | ICD-10-CM

## 2014-10-17 DIAGNOSIS — F17210 Nicotine dependence, cigarettes, uncomplicated: Secondary | ICD-10-CM | POA: Insufficient documentation

## 2014-10-17 DIAGNOSIS — M1990 Unspecified osteoarthritis, unspecified site: Secondary | ICD-10-CM | POA: Diagnosis not present

## 2014-10-17 DIAGNOSIS — R918 Other nonspecific abnormal finding of lung field: Secondary | ICD-10-CM | POA: Diagnosis not present

## 2014-10-17 DIAGNOSIS — K625 Hemorrhage of anus and rectum: Secondary | ICD-10-CM | POA: Insufficient documentation

## 2014-10-17 DIAGNOSIS — C20 Malignant neoplasm of rectum: Secondary | ICD-10-CM | POA: Insufficient documentation

## 2014-10-17 DIAGNOSIS — Z5989 Other problems related to housing and economic circumstances: Secondary | ICD-10-CM

## 2014-10-17 DIAGNOSIS — Z598 Other problems related to housing and economic circumstances: Secondary | ICD-10-CM

## 2014-10-17 DIAGNOSIS — Z800 Family history of malignant neoplasm of digestive organs: Secondary | ICD-10-CM | POA: Insufficient documentation

## 2014-10-17 DIAGNOSIS — G893 Neoplasm related pain (acute) (chronic): Secondary | ICD-10-CM

## 2014-10-17 HISTORY — DX: Malignant neoplasm of rectum: C20

## 2014-10-17 HISTORY — PX: FLEXIBLE SIGMOIDOSCOPY: SHX5431

## 2014-10-17 SURGERY — SIGMOIDOSCOPY, FLEXIBLE
Anesthesia: Moderate Sedation

## 2014-10-17 MED ORDER — MIDAZOLAM HCL 5 MG/5ML IJ SOLN
INTRAMUSCULAR | Status: DC | PRN
  Administered 2014-10-17 (×2): 2 mg via INTRAVENOUS

## 2014-10-17 MED ORDER — PROMETHAZINE HCL 25 MG/ML IJ SOLN
INTRAMUSCULAR | Status: AC
  Filled 2014-10-17: qty 1

## 2014-10-17 MED ORDER — SIMETHICONE 40 MG/0.6ML PO SUSP
ORAL | Status: DC | PRN
  Administered 2014-10-17: 11:00:00

## 2014-10-17 MED ORDER — MEPERIDINE HCL 100 MG/ML IJ SOLN
INTRAMUSCULAR | Status: AC
  Filled 2014-10-17: qty 2

## 2014-10-17 MED ORDER — LIDOCAINE HCL 2 % EX GEL
CUTANEOUS | Status: AC
  Filled 2014-10-17: qty 30

## 2014-10-17 MED ORDER — SODIUM CHLORIDE 0.9 % IV SOLN
INTRAVENOUS | Status: DC
  Administered 2014-10-17: 10:00:00 via INTRAVENOUS

## 2014-10-17 MED ORDER — MEPERIDINE HCL 100 MG/ML IJ SOLN
INTRAMUSCULAR | Status: DC | PRN
  Administered 2014-10-17: 50 mg via INTRAVENOUS
  Administered 2014-10-17: 25 mg via INTRAVENOUS

## 2014-10-17 MED ORDER — MIDAZOLAM HCL 5 MG/5ML IJ SOLN
INTRAMUSCULAR | Status: AC
  Filled 2014-10-17: qty 10

## 2014-10-17 MED ORDER — SODIUM CHLORIDE 0.9 % IJ SOLN
INTRAMUSCULAR | Status: AC
  Filled 2014-10-17: qty 3

## 2014-10-17 MED ORDER — LIDOCAINE HCL 2 % EX GEL
CUTANEOUS | Status: DC | PRN
Start: 2014-10-17 — End: 2014-10-17
  Administered 2014-10-17: 1 via TOPICAL

## 2014-10-17 MED ORDER — PROMETHAZINE HCL 25 MG/ML IJ SOLN
25.0000 mg | Freq: Once | INTRAMUSCULAR | Status: AC
  Administered 2014-10-17: 25 mg via INTRAVENOUS

## 2014-10-17 NOTE — Op Note (Signed)
[facility] [address] [place], [address]   FLEX SIGMOIDOSCOPY PROCEDURE REPORT  PATIENT: Andrew Kline, Andrew Kline  MR#: [id number] BIRTHDATE: October 15, 1965 , 49  yrs. old GENDER: male ENDOSCOPIST: Danie Binder, MD REFERRED VB:TYOMAYO Penland, MD PROCEDURE DATE:  2014/11/04 PROCEDURE:   Sigmoidoscopy with biopsy INDICATIONS:RECTAL MASS.  NO ENEMA GIVEN DUE TO SEVER RECTAL PAIN. BX MAY 2016 HIGHLY SUSPICIOUS FOR INVASIVE ADENOCARCINOMA. DR. [name] REQUESTS MORE TISSUE. MEDICATIONS: Promethazine (Phenergan) 25 mg IV, Demerol 75 mg IV, Versed 4 mg IV, and TOPICAL Lidocaine  DESCRIPTION OF PROCEDURE:    Physical exam was performed.  Informed consent was obtained from the patient after explaining the benefits, risks, and alternatives to procedure.  The patient was connected to monitor and placed in left lateral position. Continuous oxygen was provided by nasal cannula and IV medicine administered through an indwelling cannula.  After administration of sedation and rectal exam, the patients rectum was intubated and the EG-[id number] ([id number])  colonoscope was advanced under direct visualization to the RECTUM. UNABLE TO ADVANCE BEYOND RECTUM DUE TO PT'S DISCOMFORT.  The scope was removed slowly by carefully examining the color, texture, anatomy, and integrity mucosa on the way out.  The patient was recovered in endoscopy and discharged home in satisfactory condition. Estimated blood loss is zero unless otherwise noted in this procedure report.    COLON FINDINGS: A near circumferential fungating and ulcerated mass with friable surfaces was found in the rectum. IT BEGINS AT THE ANAL VERGE AND EXTENDS 5 CM INTO THE RECTUM. Multiple biopsies were performed using cold forceps.    PREP QUALITY: The overall prep quality was adequate.  COMPLICATIONS: None  ENDOSCOPIC IMPRESSION: Near circumferential mass was found in the rectum  RECOMMENDATIONS: AWAIT BIOPSY   eSigned:  Danie Binder, MD 11/04/2014 11:43 AM  CPT CODES: ICD CODES:  The ICD and CPT codes recommended by this software are interpretations from the data that the clinical staff has captured with the software.  The verification of the translation of this report to the ICD and CPT codes and modifiers is the sole responsibility of the health care institution and practicing physician where this report was generated.  [place]. will not be held responsible for the validity of the ICD and CPT codes included on this report.  AMA assumes no liability for data contained or not contained herein. CPT is a Designer, television/film set of the [employer].   PATIENT NAME:  Andrew Kline, Andrew Kline MR#: [id number]

## 2014-10-17 NOTE — Patient Instructions (Addendum)
[place] at [facility] Discharge Instructions  RECOMMENDATIONS MADE BY THE CONSULTANT AND ANY TEST RESULTS WILL BE SENT TO YOUR REFERRING PHYSICIAN.  Exam and discussion by Dr. [name].  Dr. Oneida Alar will see you today to re-biopsy the rectal.  We need to get your rectal mass re-biopsied and get you started on treatment. Will make referral to radiation.   Follow-up in 2 weeks  Thank you for choosing [place] at [facility] to provide your oncology and hematology care.  To afford each patient quality time with our provider, please arrive at least 15 minutes before your scheduled appointment time.    You need to re-schedule your appointment should you arrive 10 or more minutes late.  We strive to give you quality time with our providers, and arriving late affects you and other patients whose appointments are after yours.  Also, if you no show three or more times for appointments you may be dismissed from the clinic at the providers discretion.     Again, thank you for choosing [facility].  Our hope is that these requests will decrease the amount of time that you wait before being seen by our physicians.       _____________________________________________________________  Should you have questions after your visit to [facility], please contact our office at [phone] between the hours of 8:30 a.m. and 4:30 p.m.  Voicemails left after 4:30 p.m. will not be returned until the following business day.  For prescription refill requests, have your pharmacy contact our office.

## 2014-10-17 NOTE — Progress Notes (Signed)
[place] at [facility] Progress Note  Patient Care Team: No Pcp Per Patient as PCP - General (General Practice) Danie Binder, MD as Consulting Physician (Gastroenterology)  CHIEF COMPLAINTS/PURPOSE OF CONSULTATION:  Adenocarcinoma of the Rectum  HISTORY OF PRESENTING ILLNESS:  Andrew Kline 49 y.o. male is here because of newly diagnosed rectal cancer.  He is here alone today. He goes by [name]. He wasn't able to have a complete colonoscopy done because the medication "didn't clean him out fully".  He is here alone today.   He still experiences some pain and discomfort even with the pain medications. This pain and discomfort affects his ability to sleep at night. He has issues driving due to the discomfort sitting, after about 15 minutes while sitting on a pillow it is nearly unbearable due to the bumps in the road.  His appetite is good and he has regular bowel movements, he takes Colace to help soften his stools. Discussed other options of stool softener including milk of magnesia and Miralax. He says he has 'been having sweats' even in an air conditioned house, he attributes this to his medications.  He takes the Dilaudid in the evening before bed to help him sleep. Discussed increasing the dose to help with his pain.  MEDICAL HISTORY:  Past Medical History  Diagnosis Date  . Rectal pain   . Arthritis   . Renal cell cancer     2012.   [name] Rectal cancer     SURGICAL HISTORY: Past Surgical History  Procedure Laterality Date  . Hypospadias correction    . Laparoscopic partial nephrectomy  2012    right side  . Knee surgery Left     arthroscopy  . Flexible sigmoidoscopy N/A 09/30/2014    Procedure: FLEXIBLE SIGMOIDOSCOPY;  Surgeon: Danie Binder, MD;  Location: [facility] ORS;  Service: Endoscopy;  Laterality: N/A;  . Esophageal biopsy N/A 09/30/2014    Procedure: BIOPSY;  Surgeon: Danie Binder, MD;  Location: [facility] ORS;  Service: Endoscopy;  Laterality: N/A;  Anal  Canal  . Flexible sigmoidoscopy N/A 10/17/2014    Procedure: FLEXIBLE SIGMOIDOSCOPY;  Surgeon: Danie Binder, MD;  Location: [facility] ENDO SUITE;  Service: Endoscopy;  Laterality: N/A;  1325    SOCIAL HISTORY: History   Social History  . Marital Status: Divorced    Spouse Name: N/A  . Number of Children: 2  . Years of Education: N/A   Occupational History  . retail    Social History Main Topics  . Smoking status: Current Every Day Smoker -- 1.00 packs/day for 30 years    Types: Cigarettes  . Smokeless tobacco: Not on file     Comment: a little over a pack daily  . Alcohol Use: 0.0 oz/week    0 Standard drinks or equivalent per week     Comment: Occ  . Drug Use: No  . Sexual Activity: Yes    Birth Control/ Protection: None   Other Topics Concern  . Not on file   Social History Narrative  He is from [place], [place]. Moved here in March. Smoker, 1 ppd. Was 1.5 ppd. ETOH, none. 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 He is divorced. Lives with fiance. 2 children. 32 and 80 yo. No grandchildren.  FAMILY HISTORY: Family History  Problem Relation Age of Onset  . Lung cancer Maternal Grandfather   . Colon  cancer Maternal Uncle     age 3  . Diabetes Other   . Healthy Mother    has no family status information on file.   Mother living, 35 yo. Still working as a Audiological scientist. Father living, 81-70 yo. 1 brother, 1 sister, 1 step sister. Maternal uncle has colon cancer. He is married to a GI nurse. So he has people to go to with questions and is informed on what is happening. History of diabetes in the family. No history of heart disease in the family.  ALLERGIES:  has No Known Allergies.  MEDICATIONS:  Current Outpatient Prescriptions  Medication Sig Dispense Refill  . amitriptyline (ELAVIL) 10 MG tablet 2-3 po qhs (Patient not taking: Reported on 10/31/2014) 60 tablet 5  .  docusate sodium (COLACE) 100 MG capsule Take 1 capsule (100 mg total) by mouth every 12 (twelve) hours. 60 capsule 0  . HYDROmorphone (DILAUDID) 2 MG tablet Take 1 tablet (2 mg total) by mouth every 4 (four) hours as needed for severe pain. 90 tablet 0  . lidocaine-hydrocortisone (ANAMANTLE) 3-1 % KIT Place 1 application rectally 2 (two) times daily. For two weeks, alternate with Rectiv. 28 each 1  . Bevacizumab (AVASTIN IV) Inject into the vein every 14 (fourteen) days. To start November 19, 2014    . dextrose 5 % SOLN 1,000 mL with fluorouracil 5 GM/100ML SOLN Inject into the vein every 14 (fourteen) days. To start November 05, 2014. To infuse over 46 hours via pump.    [name] leucovorin 50 MG injection Inject into the vein every 14 (fourteen) days. To start November 05, 2014    . lidocaine-prilocaine (EMLA) cream Apply a quarter size amount to port site 1 hour prior to chemo. Do not rub in. Cover with plastic wrap. 30 g 3  . morphine (MS CONTIN) 15 MG 12 hr tablet Take 1 tablet (15 mg total) by mouth every 12 (twelve) hours. 60 tablet 0  . nitroGLYCERIN (NITROGLYN) 2 % ointment Apply 0.5 inches topically daily. Anal area    . ondansetron (ZOFRAN) 8 MG tablet Take 1 tablet (8 mg total) by mouth every 8 (eight) hours as needed for nausea or vomiting. 60 tablet 1  . OXALIPLATIN IV Inject into the vein every 14 (fourteen) days. To start November 05, 2014    . prochlorperazine (COMPAZINE) 10 MG tablet Take 1 tablet (10 mg total) by mouth every 6 (six) hours as needed for nausea or vomiting. 60 tablet 0   No current facility-administered medications for this visit.    Review of Systems  Constitutional: Positive for malaise/fatigue and diaphoresis.       He cannot sit flat on a chair due to pain.      Unable to sleep due to rectal discomfort.  Respiratory: Positive for cough. Negative for sputum production.        Painful coughing episodes began within the last 7 weeks and it hurts because it irritates his rectum.    Gastrointestinal: Positive for blood in stool.  Genitourinary: Positive for rectum pain. Psychiatric/Behavioral: The patient is not nervous/anxious.    14 point ROS was done and is otherwise as detailed above or in HPI   PHYSICAL EXAMINATION:  ECOG PERFORMANCE STATUS: 1 - Symptomatic but completely ambulatory  Filed Vitals:   10/17/14 0907  BP: 134/73  Pulse: 91  Temp: 98.2 F (36.8 C)  Resp: 18   Filed Weights   10/17/14 0907  Weight: 207 lb 3.2 oz (93.985 kg)  Physical Exam  Constitutional: He is oriented to person, place, and time and well-developed, well-nourished, and in no distress.  His walking is affected by the rectal pain, as well as arthritis.  HENT:  Head: Normocephalic and atraumatic.  Nose: Nose normal.  Mouth/Throat: Oropharynx is clear and moist. No oropharyngeal exudate.  Dentures on top.  Eyes: Conjunctivae and EOM are normal. Pupils are equal, round, and reactive to light. Right eye exhibits no discharge. Left eye exhibits no discharge. No scleral icterus.  Neck: Normal range of motion. Neck supple. No tracheal deviation present. No thyromegaly present.  Cardiovascular: Normal rate, regular rhythm and normal heart sounds.  Exam reveals no gallop and no friction rub.   No murmur heard. Pulmonary/Chest: Effort normal and breath sounds normal. He has no wheezes. He has no rales.  Abdominal: Soft. Bowel sounds are normal. He exhibits no distension and no mass. There is no tenderness. There is no rebound and no guarding.  Genitourinary:  NOTE rectal exam was deferred today secondary to significant patient discomfort  Musculoskeletal: Normal range of motion. He exhibits no edema.  Lymphadenopathy:    He has no cervical adenopathy.  Neurological: He is alert and oriented to person, place, and time. He has normal reflexes. No cranial nerve deficit. Gait normal. Coordination normal.  Skin: Skin is warm and dry. No rash noted.  Psychiatric: Mood, memory,  affect and judgment normal.  Nursing note and vitals reviewed.   LABORATORY DATA:  I have reviewed the data as listed Lab Results  Component Value Date   WBC 13.5* 10/29/2014   HGB 15.0 10/29/2014   HCT 43.8 10/29/2014   MCV 90.1 10/29/2014   PLT 272 10/29/2014   Results for MICA, RELEFORD (MRN [id number]) as of 10/14/2014 08:01  Ref. Range 09/24/2014 20:04  Sodium Latest Ref Range: 135-145 mmol/L 137  Potassium Latest Ref Range: 3.5-5.1 mmol/L 3.8  Chloride Latest Ref Range: 101-111 mmol/L 106  CO2 Latest Ref Range: 22-32 mmol/L 24  BUN Latest Ref Range: 6-20 mg/dL 10  Creatinine Latest Ref Range: 0.61-1.24 mg/dL 0.80  Calcium Latest Ref Range: 8.9-10.3 mg/dL 8.5 (L)  EGFR (Non-African Amer.) Latest Ref Range: >60 mL/min >60  EGFR (African American) Latest Ref Range: >60 mL/min >60  Glucose Latest Ref Range: 65-99 mg/dL 118 (H)  Anion gap Latest Ref Range: 5-15  7  WBC Latest Ref Range: 4.0-10.5 K/uL 9.1  RBC Latest Ref Range: 4.22-5.81 MIL/uL 4.99  Hemoglobin Latest Ref Range: 13.0-17.0 g/dL 15.4  HCT Latest Ref Range: 39.0-52.0 % 45.6  MCV Latest Ref Range: 78.0-100.0 fL 91.4  MCH Latest Ref Range: 26.0-34.0 pg 30.9  MCHC Latest Ref Range: 30.0-36.0 g/dL 33.8  RDW Latest Ref Range: 11.5-15.5 % 12.9  Platelets Latest Ref Range: 150-400 K/uL 294  Neutrophils Latest Ref Range: 43-77 % 58  Lymphocytes Latest Ref Range: 12-46 % 27  Monocytes Relative Latest Ref Range: 3-12 % 9  Eosinophil Latest Ref Range: 0-5 % 6 (H)  Basophil Latest Ref Range: 0-1 % 0  NEUT# Latest Ref Range: 1.7-7.7 K/uL 5.2  Lymphocyte # Latest Ref Range: 0.7-4.0 K/uL 2.5  Monocyte # Latest Ref Range: 0.1-1.0 K/uL 0.9  Eosinophils Absolute Latest Ref Range: 0.0-0.7 K/uL 0.5  Basophils Absolute Latest Ref Range: 0.0-0.1 K/uL 0.0    RADIOGRAPHIC STUDIES: I have personally reviewed the radiological images as listed and agreed with the findings in the report.   CLINICAL DATA: Multiple lung nodules seen  on last CT. Rectal carcinoma diagnosis 2 weeks prior.  History of partial nephrectomy. Subsequent stress initial treatment evaluation.  EXAM: CT CHEST WITH CONTRAST  TECHNIQUE: Multidetector CT imaging of the chest was performed during intravenous contrast administration.  CONTRAST: 2m OMNIPAQUE IOHEXOL 300 MG/ML SOLN  COMPARISON: CT abdomen 09/24/2014  FINDINGS: Mediastinum/Nodes: No axillary or supraclavicular lymphadenopathy. Several small paratracheal lymph nodes which are not pathologic by size criteria. For example 7 mm subcarinal lymph node and sub mm right lower paratracheal lymph node. No hilar lymphadenopathy. No pericardial fluid.  Lungs/Pleura: Within the right lower lobe round nodule with central low-attenuation measures 17 mm on image 32, series 3. This is not changed in size from 17 mm on recent CT of 09/24/2014.  Within the right upper lobe there are 2 adjacent noncalcified nodules measuring 5 mm and 2 mm on image 22.  Nodule within the medial lingula measures 9 mm on image 30. Nodule in the superior segment of the left lower lobe measures 5 mm on image 21. Two additional nodules at the left lung apex measuring 4 mm.  Upon further inspection there additional nodules in the right lung with a 10 mm nodule in the right middle lobe on image 30 and a 7 mm nodule the azygos esophageal recess on image 45.  Upper abdomen: Limited view of the liver, kidneys, pancreas are unremarkable. Normal adrenal glands.  Musculoskeletal: No aggressive osseous lesion.  IMPRESSION: 1. Bilateral pulmonary nodules of varying size are most consistent with pulmonary metastasis. There are approximately 6 lesions in each lung. 2. Small mediastinal lymph nodes are indeterminate.   Electronically Signed  By: SSuzy BouchardM.D.  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 Dr. fOneida Alarwill see the patient now and perform another flexible sigmoidoscopy and biopsy to confirm invasive disease. His pulmonary biopsy is upcoming and pending. We have gotten him long-acting pain medication and I have encouraged him to continue this twice daily, we can increase the dose. He can continue to use Dilaudid for breakthrough. His Medicaid is still pending.  He will need systemic chemotherapy, I still suspect stage IV disease based upon imaging. Given his history of renal cell carcinoma biopsy of the lung is necessary. However I advised him that radiation will most definitely be needed for palliation of his rectal discomfort and we will refer him to radiation.  He will need a Port-A-Cath.   All questions were answered. The patient knows to call the clinic with any problems, questions or concerns.  This note was electronically signed.    This document serves as a record of services personally performed by SAncil Linsey MD. It was created on her behalf by EArlyce Harman a trained medical scribe. The creation of this record is based on the scribe's personal observations and the provider's statements to them. This document has been checked and approved by the attending provider.  I have reviewed the above documentation for accuracy and completeness, and I agree with the above.  PMolli Hazard MD

## 2014-10-17 NOTE — Progress Notes (Signed)
Referral sent to [facility].  Appointment is 10/2314 '@1045'$ 

## 2014-10-17 NOTE — H&P (Signed)
Primary Care Physician:  No PCP Per Patient Primary Gastroenterologist:  Dr. Oneida Alar  Pre-Procedure History & Physical: HPI:  Andrew Kline is a 49 y.o. male here for RECTAL BLEEDING/pain.  Past Medical History  Diagnosis Date  . Rectal pain   . Arthritis   . Renal cell cancer     2012.   [name] Rectal cancer     Past Surgical History  Procedure Laterality Date  . Hypospadias correction    . Laparoscopic partial nephrectomy  2012    right side  . Knee surgery Left     arthroscopy  . Flexible sigmoidoscopy N/A 09/30/2014    Procedure: FLEXIBLE SIGMOIDOSCOPY;  Surgeon: Danie Binder, MD;  Location: [facility]S;  Service: Endoscopy;  Laterality: N/A;  . Esophageal biopsy N/A 09/30/2014    Procedure: BIOPSY;  Surgeon: Danie Binder, MD;  Location: [facility] ORS;  Service: Endoscopy;  Laterality: N/A;  Anal Canal    Prior to Admission medications   Medication Sig Start Date End Date Taking? Authorizing Provider  amitriptyline (ELAVIL) 10 MG tablet 2-3 po qhs 09/30/14  Yes Danie Binder, MD  docusate sodium (COLACE) 100 MG capsule Take 1 capsule (100 mg total) by mouth every 12 (twelve) hours. 09/25/14  Yes Christopher Lawyer, PA-C  HYDROmorphone (DILAUDID) 2 MG tablet Take 1 tablet (2 mg total) by mouth every 4 (four) hours as needed for severe pain. 10/10/14  Yes Patrici Ranks, MD  lidocaine-hydrocortisone (ANAMANTLE) 3-1 % KIT Place 1 application rectally 2 (two) times daily. For two weeks, alternate with Rectiv. 09/26/14  Yes Mahala Menghini, PA-C  morphine (MS CONTIN) 15 MG 12 hr tablet Take 1 tablet (15 mg total) by mouth every 12 (twelve) hours. 10/06/14  Yes Patrici Ranks, MD  Nitroglycerin (RECTIV) 0.4 % OINT Apply one inch of medication on a gloved finger and insert into anal canal twice daily for 3 weeks. 09/26/14  Yes Mahala Menghini, PA-C    Allergies as of 10/17/2014  . (No Known Allergies)    Family History  Problem Relation Age of Onset  . Lung cancer Maternal Grandfather   .  Colon cancer Maternal Uncle     age 37  . Diabetes Other   . Healthy Mother     History   Social History  . Marital Status: Divorced    Spouse Name: N/A  . Number of Children: 2  . Years of Education: N/A   Occupational History  . retail    Social History Main Topics  . Smoking status: Current Every Day Smoker -- 1.00 packs/day for 30 years    Types: Cigarettes  . Smokeless tobacco: Not on file     Comment: a little over a pack daily  . Alcohol Use: 0.0 oz/week    0 Standard drinks or equivalent per week     Comment: Occ  . Drug Use: No  . Sexual Activity: Yes    Birth Control/ Protection: None   Other Topics Concern  . Not on file   Social History Narrative    Review of Systems: See HPI, otherwise negative ROS   Physical Exam: BP 151/74 mmHg  Pulse 93  Temp(Src) 98.3 F (36.8 C) (Oral)  Resp 20  Ht 5' 5" (1.651 m)  Wt 207 lb (93.895 kg)  BMI 34.45 kg/m2  SpO2 97% General:   Alert,  pleasant and cooperative in NAD Head:  Normocephalic and atraumatic. Neck:  Supple; Lungs:  Clear throughout to auscultation.  Heart:  Regular rate and rhythm. Abdomen:  Soft, nontender and nondistended. Normal bowel sounds, without guarding, and without rebound.   Neurologic:  Alert and  oriented x4;  grossly normal neurologically.  Impression/Plan:   RECTAL BLEEDING/pain  PLAN:  1. FLEX SIG TODAY

## 2014-10-17 NOTE — Discharge Instructions (Signed)
YOU HAVE A RECTAL MASS. I BIOPSIED THE MASS.   YOUR BIOPSY RESULTS WILL BE AVAILABLE IN MY CHART AFTER JUN 21 AND MY OFFICE WILL CONTACT YOU IN 10-14 DAYS WITH YOUR RESULTS.    SIGMOIDOSCOPY Care After Read the instructions outlined below and refer to this sheet in the next week. These discharge instructions provide you with general information on caring for yourself after you leave the hospital. While your treatment has been planned according to the most current medical practices available, unavoidable complications occasionally occur. If you have any problems or questions after discharge, call DR. Natosha Bou, [phone].  ACTIVITY  You may resume your regular activity, but move at a slower pace for the next 24 hours.   Take frequent rest periods for the next 24 hours.   Walking will help get rid of the air and reduce the bloated feeling in your belly (abdomen).   No driving for 24 hours (because of the medicine (anesthesia) used during the test).   You may shower.   Do not sign any important legal documents or operate any machinery for 24 hours (because of the anesthesia used during the test).    NUTRITION  Drink plenty of fluids.   You may resume your normal diet as instructed by your doctor.   Begin with a light meal and progress to your normal diet. Heavy or fried foods are harder to digest and may make you feel sick to your stomach (nauseated).   Avoid alcoholic beverages for 24 hours or as instructed.    MEDICATIONS  You may resume your normal medications.   WHAT YOU CAN EXPECT TODAY  Some feelings of bloating in the abdomen.   Passage of more gas than usual.   Spotting of blood in your stool or on the toilet paper  .  IF YOU HAD POLYPS REMOVED DURING THE SIGMOIDOSCOPY:  Eat a soft diet IF YOU HAVE NAUSEA, BLOATING, ABDOMINAL PAIN, OR VOMITING.    FINDING OUT THE RESULTS OF YOUR TEST Not all test results are available during your visit. DR. Oneida Alar WILL CALL  YOU WITHIN 14 DAYS OF YOUR PROCEDUE WITH YOUR RESULTS. Do not assume everything is normal if you have not heard from DR. Akilah Cureton, CALL HER OFFICE AT [phone].  SEEK IMMEDIATE MEDICAL ATTENTION AND CALL THE OFFICE: [phone] IF:  You have more than a spotting of blood in your stool.   Your belly is swollen (abdominal distention).   You are nauseated or vomiting.   You have a temperature over 101F.   You have abdominal pain or discomfort that is severe or gets worse throughout the day.

## 2014-10-17 NOTE — Telephone Encounter (Signed)
PT NEEDS FLEX SIG TODAY FOR RECTAL MASS. WILL BE [facility].

## 2014-10-17 NOTE — Telephone Encounter (Signed)
Noted and orders entered. Pt is reporting to Endo Unit now

## 2014-10-20 ENCOUNTER — Other Ambulatory Visit: Payer: Self-pay | Admitting: Radiology

## 2014-10-20 NOTE — Progress Notes (Signed)
Quick Note:  LMOM for a return call. ______ 

## 2014-10-21 ENCOUNTER — Other Ambulatory Visit: Payer: Self-pay | Admitting: Radiology

## 2014-10-22 ENCOUNTER — Ambulatory Visit (HOSPITAL_COMMUNITY): Admission: RE | Admit: 2014-10-22 | Payer: Self-pay | Source: Ambulatory Visit

## 2014-10-27 ENCOUNTER — Encounter (HOSPITAL_COMMUNITY): Payer: Self-pay | Admitting: Gastroenterology

## 2014-10-28 ENCOUNTER — Other Ambulatory Visit: Payer: Self-pay | Admitting: Physician Assistant

## 2014-10-28 ENCOUNTER — Other Ambulatory Visit (HOSPITAL_COMMUNITY): Payer: Self-pay | Admitting: Oncology

## 2014-10-28 DIAGNOSIS — K6289 Other specified diseases of anus and rectum: Secondary | ICD-10-CM

## 2014-10-29 ENCOUNTER — Encounter (HOSPITAL_COMMUNITY): Payer: Self-pay

## 2014-10-29 ENCOUNTER — Ambulatory Visit (HOSPITAL_COMMUNITY)
Admission: RE | Admit: 2014-10-29 | Discharge: 2014-10-29 | Disposition: A | Discharge status: Home / Self Care | Payer: Medicaid Other | Source: Ambulatory Visit | Attending: Oncology | Admitting: Oncology

## 2014-10-29 ENCOUNTER — Other Ambulatory Visit (HOSPITAL_COMMUNITY): Payer: Self-pay | Admitting: Oncology

## 2014-10-29 ENCOUNTER — Ambulatory Visit (HOSPITAL_COMMUNITY)
Admission: RE | Admit: 2014-10-29 | Discharge: 2014-10-29 | Disposition: A | Discharge status: Home / Self Care | Payer: Medicaid Other | Source: Ambulatory Visit | Attending: Hematology & Oncology | Admitting: Hematology & Oncology

## 2014-10-29 ENCOUNTER — Ambulatory Visit (HOSPITAL_COMMUNITY)
Admission: RE | Admit: 2014-10-29 | Discharge: 2014-10-29 | Disposition: A | Discharge status: Home / Self Care | Payer: Medicaid Other | Source: Ambulatory Visit | Attending: Interventional Radiology | Admitting: Interventional Radiology

## 2014-10-29 ENCOUNTER — Telehealth: Payer: Self-pay | Admitting: Surgery

## 2014-10-29 DIAGNOSIS — K6289 Other specified diseases of anus and rectum: Secondary | ICD-10-CM

## 2014-10-29 DIAGNOSIS — C20 Malignant neoplasm of rectum: Secondary | ICD-10-CM | POA: Diagnosis present

## 2014-10-29 DIAGNOSIS — Z833 Family history of diabetes mellitus: Secondary | ICD-10-CM | POA: Insufficient documentation

## 2014-10-29 DIAGNOSIS — R58 Hemorrhage, not elsewhere classified: Secondary | ICD-10-CM

## 2014-10-29 DIAGNOSIS — Z85528 Personal history of other malignant neoplasm of kidney: Secondary | ICD-10-CM | POA: Diagnosis not present

## 2014-10-29 DIAGNOSIS — C7802 Secondary malignant neoplasm of left lung: Secondary | ICD-10-CM | POA: Insufficient documentation

## 2014-10-29 DIAGNOSIS — R918 Other nonspecific abnormal finding of lung field: Secondary | ICD-10-CM

## 2014-10-29 DIAGNOSIS — Z800 Family history of malignant neoplasm of digestive organs: Secondary | ICD-10-CM | POA: Insufficient documentation

## 2014-10-29 DIAGNOSIS — Z905 Acquired absence of kidney: Secondary | ICD-10-CM | POA: Insufficient documentation

## 2014-10-29 DIAGNOSIS — F17210 Nicotine dependence, cigarettes, uncomplicated: Secondary | ICD-10-CM | POA: Diagnosis not present

## 2014-10-29 LAB — CBC
HEMATOCRIT: 43.8 % (ref 39.0–52.0)
Hemoglobin: 15 g/dL (ref 13.0–17.0)
MCH: 30.9 pg (ref 26.0–34.0)
MCHC: 34.2 g/dL (ref 30.0–36.0)
MCV: 90.1 fL (ref 78.0–100.0)
Platelets: 272 10*3/uL (ref 150–400)
RBC: 4.86 MIL/uL (ref 4.22–5.81)
RDW: 12.8 % (ref 11.5–15.5)
WBC: 13.5 10*3/uL — AB (ref 4.0–10.5)

## 2014-10-29 LAB — PROTIME-INR
INR: 1.04 (ref 0.00–1.49)
PROTHROMBIN TIME: 13.8 s (ref 11.6–15.2)

## 2014-10-29 LAB — APTT: APTT: 34 s (ref 24–37)

## 2014-10-29 MED ORDER — SODIUM CHLORIDE 0.9 % IV SOLN
Freq: Once | INTRAVENOUS | Status: AC
  Administered 2014-10-29: 10 mL/h via INTRAVENOUS

## 2014-10-29 MED ORDER — MIDAZOLAM HCL 2 MG/2ML IJ SOLN
INTRAMUSCULAR | Status: DC | PRN
  Administered 2014-10-29: 0.5 mg via INTRAVENOUS
  Administered 2014-10-29: 1 mg via INTRAVENOUS

## 2014-10-29 MED ORDER — CEFAZOLIN SODIUM 1-5 GM-% IV SOLN
1.0000 g | Freq: Once | INTRAVENOUS | Status: DC
  Administered 2014-10-29: 1 g via INTRAVENOUS

## 2014-10-29 MED ORDER — HYDROCODONE-ACETAMINOPHEN 5-325 MG PO TABS
ORAL_TABLET | ORAL | Status: AC
  Filled 2014-10-29: qty 2

## 2014-10-29 MED ORDER — LIDOCAINE-EPINEPHRINE 1 %-1:100000 IJ SOLN
INTRAMUSCULAR | Status: AC
  Filled 2014-10-29: qty 1

## 2014-10-29 MED ORDER — HEPARIN SOD (PORK) LOCK FLUSH 100 UNIT/ML IV SOLN
INTRAVENOUS | Status: AC
  Filled 2014-10-29: qty 5

## 2014-10-29 MED ORDER — FENTANYL CITRATE (PF) 100 MCG/2ML IJ SOLN
INTRAMUSCULAR | Status: DC | PRN
  Administered 2014-10-29: 25 ug via INTRAVENOUS
  Administered 2014-10-29 (×2): 50 ug via INTRAVENOUS

## 2014-10-29 MED ORDER — CEFAZOLIN SODIUM-DEXTROSE 2-3 GM-% IV SOLR
INTRAVENOUS | Status: AC
  Filled 2014-10-29: qty 50

## 2014-10-29 MED ORDER — MIDAZOLAM HCL 2 MG/2ML IJ SOLN
INTRAMUSCULAR | Status: AC
  Filled 2014-10-29: qty 2

## 2014-10-29 MED ORDER — LIDOCAINE HCL 1 % IJ SOLN
INTRAMUSCULAR | Status: AC
  Filled 2014-10-29: qty 20

## 2014-10-29 MED ORDER — CEFAZOLIN SODIUM-DEXTROSE 2-3 GM-% IV SOLR: 2.0000 g | Freq: Three times a day (TID) | INTRAVENOUS | Status: DC

## 2014-10-29 MED ORDER — FENTANYL CITRATE (PF) 100 MCG/2ML IJ SOLN
INTRAMUSCULAR | Status: AC
  Filled 2014-10-29: qty 2

## 2014-10-29 MED ORDER — HYDROCODONE-ACETAMINOPHEN 5-325 MG PO TABS
1.0000 | ORAL_TABLET | ORAL | Status: DC | PRN
  Administered 2014-10-29: 2 via ORAL

## 2014-10-29 NOTE — Telephone Encounter (Signed)
ED CM received call from concerning patient needing medication assistance. CM spoke with patient he states, he cannot afford to fill prescriptions. He states, he not working at this time and is uninsured according to the patient's record. Discussed MATCH program and guidelines with patient over the phone emphasized that there will be a $3 co-pay per prescription, patient is agreeable with discharge plan.  Patient is eligible and enrolled into the program.  Patient states, his pharmacy is [place] fax [phone], offered to fax [facility] letter to patient's pharmacy patient agreeable and verbalizes appreciation for the assistance.  Fax confirmation received. No further ED CM needs identified

## 2014-10-29 NOTE — Sedation Documentation (Signed)
Patient is resting comfortably. 

## 2014-10-29 NOTE — Sedation Documentation (Signed)
Patient states pain is better

## 2014-10-29 NOTE — Sedation Documentation (Signed)
Pt stable. Coughing has subsided. Able to transfer to IR for [facility] placement. Vitals stable, will continue to monitor during transport. RN at bedside

## 2014-10-29 NOTE — Sedation Documentation (Signed)
Patient c/o pain.  

## 2014-10-29 NOTE — H&P (Signed)
Chief Complaint: New rectal ca dx B Pulmonary nodules Need for chemo  Referring Physician(s): Penland,Shannon K  History of Present Illness: Andrew Kline is a 49 y.o. 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 Rectal pain   . Arthritis   . Renal cell cancer     2012.   [name] Rectal cancer     Past Surgical History  Procedure Laterality Date  . Hypospadias correction    . Laparoscopic partial nephrectomy  2012    right side  . Knee surgery Left     arthroscopy  . Flexible sigmoidoscopy N/A 09/30/2014    Procedure: FLEXIBLE SIGMOIDOSCOPY;  Surgeon: Danie Binder, MD;  Location: [facility];  Service: Endoscopy;  Laterality: N/A;  . Esophageal biopsy N/A 09/30/2014    Procedure: BIOPSY;  Surgeon: Danie Binder, MD;  Location: [facility];  Service: Endoscopy;  Laterality: N/A;  Anal Canal  . Flexible sigmoidoscopy N/A 10/17/2014    Procedure: FLEXIBLE SIGMOIDOSCOPY;  Surgeon: Danie Binder, MD;  Location: [facility] SUITE;  Service: Endoscopy;  Laterality: N/A;  1325    Allergies: Review of patient's allergies indicates no known allergies.  Medications: Prior to Admission medications   Medication Sig Start Date End Date Taking? Authorizing Provider  amitriptyline (ELAVIL) 10 MG tablet 2-3 po qhs 09/30/14  Yes Danie Binder, MD  docusate sodium (COLACE) 100 MG capsule Take 1 capsule (100 mg total) by mouth every 12 (twelve) hours. 09/25/14  Yes Christopher Lawyer, PA-C  HYDROmorphone (DILAUDID) 2 MG tablet Take 1 tablet (2 mg total) by mouth every 4 (four) hours as needed for severe pain. 10/10/14  Yes Patrici Ranks, MD  lidocaine-hydrocortisone (ANAMANTLE) 3-1 % KIT Place 1 application rectally 2 (two) times daily. For two weeks, alternate with Rectiv. 09/26/14  Yes Mahala Menghini, PA-C  morphine (MS CONTIN) 15 MG 12 hr tablet Take 1 tablet (15 mg total) by mouth every 12 (twelve) hours. 10/06/14  Yes Patrici Ranks, MD     Family History  Problem Relation Age of Onset  . Lung cancer Maternal Grandfather   . Colon cancer Maternal Uncle     age 35  . Diabetes Other   . Healthy Mother     History   Social History  . Marital Status: Divorced    Spouse Name: N/A  . Number of Children: 2  . Years of Education: N/A   Occupational History  . retail    Social History Main Topics  . Smoking status: Current Every Day Smoker -- 1.00 packs/day for 30 years    Types: Cigarettes  . Smokeless tobacco: Not on file     Comment: a little over a pack daily  . Alcohol Use: 0.0 oz/week    0 Standard drinks or equivalent per week     Comment: Occ  . Drug Use: No  . Sexual Activity: Yes    Birth Control/ Protection: None   Other Topics Concern  . None   Social History Narrative      Review of Systems: A 12 point ROS discussed and pertinent positives are indicated in the HPI above.  All other systems are negative.  Review of Systems  Constitutional: Positive for activity change,  fatigue and unexpected weight change.  Respiratory: Negative for cough.   Gastrointestinal: Negative for abdominal pain.  Musculoskeletal: Positive for back pain.  Neurological: Positive for dizziness and weakness. Negative for facial asymmetry and headaches.  Psychiatric/Behavioral: Negative for behavioral problems and confusion.    Vital Signs: BP 128/81 mmHg  Pulse 96  Temp(Src) 98.7 F (37.1 C)  Resp 18  Ht '5\' 6"'  (1.676 m)  Wt 207 lb (93.895 kg)  BMI 33.43 kg/m2  SpO2 99%  Physical Exam  Constitutional: He is oriented to person, place, and time. He appears well-nourished.  Cardiovascular: Normal rate, regular rhythm and normal heart sounds.   No murmur heard. Pulmonary/Chest: Effort normal and breath sounds normal. He has no wheezes.  Abdominal: Soft. Bowel  sounds are normal. There is no tenderness.  Musculoskeletal: Normal range of motion.  Neurological: He is alert and oriented to person, place, and time.  Skin: Skin is warm and dry.  Psychiatric: He has a normal mood and affect. His behavior is normal. Judgment and thought content normal.  Nursing note and vitals reviewed.   Mallampati Score:  MD Evaluation Airway: WNL Heart: WNL Abdomen: WNL Chest/ Lungs: WNL ASA  Classification: 3 Mallampati/Airway Score: Two  Imaging: Ct Chest W Contrast  10/09/2014   CLINICAL DATA:  Multiple lung nodules seen on last CT. Rectal carcinoma diagnosis 2 weeks prior. History of partial nephrectomy. Subsequent stress initial treatment evaluation.  EXAM: CT CHEST WITH CONTRAST  TECHNIQUE: Multidetector CT imaging of the chest was performed during intravenous contrast administration.  CONTRAST:  32m OMNIPAQUE IOHEXOL 300 MG/ML  SOLN  COMPARISON:  CT abdomen 09/24/2014  FINDINGS: Mediastinum/Nodes: No axillary or supraclavicular lymphadenopathy. Several small paratracheal lymph nodes which are not pathologic by size criteria. For example 7 mm subcarinal lymph node and sub mm right lower paratracheal lymph node. No hilar lymphadenopathy. No pericardial fluid.  Lungs/Pleura: Within the right lower lobe round nodule with central low-attenuation measures 17 mm on image 32, series 3. This is not changed in size from 17 mm on recent CT of 09/24/2014.  Within the right upper lobe there are 2 adjacent noncalcified nodules measuring 5 mm and 2 mm on image 22.  Nodule within the medial lingula measures 9 mm on image 30. Nodule in the superior segment of the left lower lobe measures 5 mm on image 21. Two additional nodules at the left lung apex measuring 4 mm.  Upon further inspection there additional nodules in the right lung with a 10 mm nodule in the right middle lobe on image 30 and a 7 mm nodule the azygos esophageal recess on image 45.  Upper abdomen: Limited view of the  liver, kidneys, pancreas are unremarkable. Normal adrenal glands.  Musculoskeletal: No aggressive osseous lesion.  IMPRESSION: 1. Bilateral pulmonary nodules of varying size are most consistent with pulmonary metastasis. There are approximately 6 lesions in each lung. 2. Small mediastinal lymph nodes are indeterminate.   Electronically Signed   By: SSuzy BouchardM.D.   On: 10/09/2014 09:45    Labs:  CBC:  Recent Labs  09/20/14 1354 09/24/14 2004 10/29/14 1013  WBC 9.6 9.1 13.5*  HGB 15.4 15.4 15.0  HCT 46.2 45.6 43.8  PLT 326 294 272    COAGS: No results for input(s): INR, APTT in the last 8760 hours.  BMP:  Recent Labs  09/20/14 1354 09/24/14 2004  NA 139 137  K 4.0 3.8  CL 105 106  CO2 28 24  GLUCOSE 92 118*  BUN 8 10  CALCIUM 8.9 8.5*  CREATININE 0.90 0.80  GFRNONAA >60 >60  GFRAA >60 >60    LIVER FUNCTION TESTS: No results for input(s): BILITOT, AST, ALT, ALKPHOS, PROT, ALBUMIN in the last 8760 hours.  TUMOR MARKERS:  Recent Labs  10/06/14 1350  CEA 4.3    Assessment and Plan:  Hx renal cell ca New rectal ca B pulm nodules And need to start chemo therapy soon Now scheduled for Rt lung mass bx and PAC placement Risks and Benefits discussed with the patient including, but not limited to bleeding, hemoptysis, respiratory failure requiring intubation, infection, pneumothorax requiring chest tube placement, stroke from air embolism or even death. All of the patient's questions were answered, patient is agreeable to proceed. Consent signed and in chart. Risks and Benefits discussed with the patient including, but not limited to bleeding, infection, pneumothorax, or fibrin sheath development and need for additional procedures. All of the patient's questions were answered, patient is agreeable to proceed. Consent signed and in chart.   Thank you for this interesting consult.  I greatly enjoyed meeting Andrew Kline and look forward to participating in  their care.  Signed: Mel Tadros A 10/29/2014, 10:33 AM   I spent a total of  20 Minutes   in face to face in clinical consultation, greater than 50% of which was counseling/coordinating care for Rt lung mass bx and PAC placement

## 2014-10-29 NOTE — Procedures (Signed)
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   See complete dictation in [facility].

## 2014-10-29 NOTE — Sedation Documentation (Addendum)
After obtaining biopsies pt experienced hemoptysis. Suction at bedside. Md at bedside, sinus tach on monitor but vitals stable otherwise.

## 2014-10-29 NOTE — Sedation Documentation (Signed)
Patient is resting comfortably. Complains of rectal pain and burning. Ice pack applied to rectal area per pt request.

## 2014-10-29 NOTE — Discharge Instructions (Signed)
Needle Biopsy of Lung, Care After Refer to this sheet in the next few weeks. These instructions provide you with information on caring for yourself after your procedure. Your health care provider may also give you more specific instructions. Your treatment has been planned according to current medical practices, but problems sometimes occur. Call your health care provider if you have any problems or questions after your procedure. WHAT TO EXPECT AFTER THE PROCEDURE  A bandage will be applied over the area where the needle was inserted. You may be asked to apply pressure to the bandage for several minutes to ensure there is minimal bleeding.  In most cases, you can leave when your needle biopsy procedure is completed. Do not drive yourself home. Someone else should take you home.  If you received an IV sedative or general anesthetic, you will be taken to a comfortable place to relax while the medicine wears off.  If you have upcoming travel scheduled, talk to your health care provider about when it is safe to travel by air after the procedure. HOME CARE INSTRUCTIONS  Expect to take it easy for the rest of the day.  Protect the area where you received the needle biopsy by keeping the bandage in place for as long as instructed.  You may feel some mild pain or discomfort in the area, but this should stop in a day or two.  Take medicines only as directed by your health care provider. SEEK MEDICAL CARE IF:   You have pain at the biopsy site that worsens or is not helped by medicine.  You have swelling or drainage at the needle biopsy site.  You have a fever. SEEK IMMEDIATE MEDICAL CARE IF:   You have new or worsening shortness of breath.  You have chest pain.  You are coughing up blood.  You have bleeding that does not stop with pressure or a bandage.  You develop light-headedness or fainting. Document Released: 02/13/2007 Document Revised: 09/02/2013 Document Reviewed:  09/10/2012 [facility] Patient Information 2015 [place], [place]. This information is not intended to replace advice given to you by your health care provider. Make sure you discuss any questions you have with your health care provider. Implanted [facility] Guide An implanted port is a type of central line that is placed under the skin. Central lines are used to provide IV access when treatment or nutrition needs to be given through a person's veins. Implanted ports are used for long-term IV access. An implanted port may be placed because:   You need IV medicine that would be irritating to the small veins in your hands or arms.   You need long-term IV medicines, such as antibiotics.   You need IV nutrition for a long period.   You need frequent blood draws for lab tests.   You need dialysis.  Implanted ports are usually placed in the chest area, but they can also be placed in the upper arm, the abdomen, or the leg. An implanted port has two main parts:   Reservoir. The reservoir is round and will appear as a small, raised area under your skin. The reservoir is the part where a needle is inserted to give medicines or draw blood.   Catheter. The catheter is a thin, flexible tube that extends from the reservoir. The catheter is placed into a large vein. Medicine that is inserted into the reservoir goes into the catheter and then into the vein.  HOW WILL I CARE FOR MY INCISION SITE? Do  not get the incision site wet. Bathe or shower as directed by your health care provider.  HOW IS MY PORT ACCESSED? Special steps must be taken to access the port:   Before the port is accessed, a numbing cream can be placed on the skin. This helps numb the skin over the port site.   Your health care provider uses a sterile technique to access the port.  Your health care provider must put on a mask and sterile gloves.  The skin over your port is cleaned carefully with an antiseptic and allowed to  dry.  The port is gently pinched between sterile gloves, and a needle is inserted into the port.  Only "non-coring" port needles should be used to access the port. Once the port is accessed, a blood return should be checked. This helps ensure that the port is in the vein and is not clogged.   If your port needs to remain accessed for a constant infusion, a clear (transparent) bandage will be placed over the needle site. The bandage and needle will need to be changed every week, or as directed by your health care provider.   Keep the bandage covering the needle clean and dry. Do not get it wet. Follow your health care provider's instructions on how to take a shower or bath while the port is accessed.   If your port does not need to stay accessed, no bandage is needed over the port.  WHAT IS FLUSHING? Flushing helps keep the port from getting clogged. Follow your health care provider's instructions on how and when to flush the port. Ports are usually flushed with saline solution or a medicine called heparin. The need for flushing will depend on how the port is used.   If the port is used for intermittent medicines or blood draws, the port will need to be flushed:   After medicines have been given.   After blood has been drawn.   As part of routine maintenance.   If a constant infusion is running, the port may not need to be flushed.  HOW LONG WILL MY PORT STAY IMPLANTED? The port can stay in for as long as your health care provider thinks it is needed. When it is time for the port to come out, surgery will be done to remove it. The procedure is similar to the one performed when the port was put in.  WHEN SHOULD I SEEK IMMEDIATE MEDICAL CARE? When you have an implanted port, you should seek immediate medical care if:   You notice a bad smell coming from the incision site.   You have swelling, redness, or drainage at the incision site.   You have more swelling or pain at the  port site or the surrounding area.   You have a fever that is not controlled with medicine. Document Released: 04/18/2005 Document Revised: 02/06/2013 Document Reviewed: 12/24/2012 [facility] Patient Information 2015 [place], [place]. This information is not intended to replace advice given to you by your health care provider. Make sure you discuss any questions you have with your health care provider.

## 2014-10-29 NOTE — Progress Notes (Signed)
Pt's girlfriend states concern about pt's pain medicine. She states his disability and medicaid are pending and he cannot afford his pain medicine or gas to travel back and forth to Dr. Thomasene Lot and chemo. She states that there is a Education officer, museum who is working on it at [employer] but they cannot hold out.  Jody, Education officer, museum for procedural short stay was notified and spoke with pt  .She stated she was able to send payment to the pharmacy in [place] for pt's medicine and that she will contact the other social worker on his case to coordinate his care and resources.

## 2014-10-29 NOTE — Sedation Documentation (Signed)
Patient c/o rectal pain. Flat position is not desirable

## 2014-10-30 ENCOUNTER — Other Ambulatory Visit (HOSPITAL_COMMUNITY): Payer: Self-pay | Admitting: Hematology & Oncology

## 2014-10-30 DIAGNOSIS — C7800 Secondary malignant neoplasm of unspecified lung: Principal | ICD-10-CM | POA: Insufficient documentation

## 2014-10-30 DIAGNOSIS — C20 Malignant neoplasm of rectum: Secondary | ICD-10-CM

## 2014-10-31 ENCOUNTER — Encounter: Payer: Self-pay | Admitting: *Deleted

## 2014-10-31 ENCOUNTER — Ambulatory Visit (HOSPITAL_COMMUNITY)
Admission: RE | Admit: 2014-10-31 | Discharge: 2014-10-31 | Disposition: A | Discharge status: Home / Self Care | Payer: Medicaid Other | Source: Ambulatory Visit | Attending: Oncology | Admitting: Oncology

## 2014-10-31 ENCOUNTER — Encounter (HOSPITAL_COMMUNITY): Payer: Medicaid Other | Attending: Oncology | Admitting: Oncology

## 2014-10-31 VITALS — BP 153/82 | HR 85 | Temp 98.3°F | Resp 18 | Wt 205.6 lb

## 2014-10-31 DIAGNOSIS — R042 Hemoptysis: Secondary | ICD-10-CM | POA: Diagnosis not present

## 2014-10-31 DIAGNOSIS — R911 Solitary pulmonary nodule: Secondary | ICD-10-CM | POA: Diagnosis not present

## 2014-10-31 DIAGNOSIS — C20 Malignant neoplasm of rectum: Secondary | ICD-10-CM

## 2014-10-31 DIAGNOSIS — C7800 Secondary malignant neoplasm of unspecified lung: Secondary | ICD-10-CM

## 2014-10-31 DIAGNOSIS — R918 Other nonspecific abnormal finding of lung field: Secondary | ICD-10-CM | POA: Insufficient documentation

## 2014-10-31 DIAGNOSIS — R0789 Other chest pain: Secondary | ICD-10-CM | POA: Insufficient documentation

## 2014-10-31 MED ORDER — MORPHINE SULFATE ER 15 MG PO TBCR: 15.0000 mg | EXTENDED_RELEASE_TABLET | Freq: Two times a day (BID) | ORAL | Status: DC

## 2014-10-31 NOTE — Progress Notes (Signed)
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 male returns for followup of Stage IV CRC.    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    09/25/2014 Imaging CT abd/pelvis- Indeterminate nodules within the bilateral lung bases with dominant approximately 1.7 cm centrally necrotic nodule within the right lower lobe...   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   10/09/2014 Imaging CT Chest- Bilateral pulmonary nodules of varying size are most consistent with pulmonary metastasis. There are approximately 6 lesions in each lung.   10/17/2014 Pathology Results Rectum, biopsy - INVASIVE ADENOCARCINOMA   10/29/2014 Pathology Results Diagnosis Lung, needle/core biopsy(ies), right lower lobe - METASTATIC ADENOCARCINOMA   I personally reviewed and went over radiographic studies with the patient.  The results are noted within this dictation.    I personally reviewed and went over laboratory results with the patient.  The results are noted within this dictation.  I personally reviewed and went over pathology results with the patient.  He is provided education regarding Stage IV CRC with median survival of 22- 23 months.  We discussed treatment including FOLFOX + Avastin.  We discussed the incurability of this disease.  We discussed the role of palliative radiation to his anorectal area for pain control.  He should not receive curative dosing since he has Stage IV malignancy.  He will start  chemotherapy next week, with Avastin being held.  He will start Radiation therapy as planned.  He was visibly upset with this news.  His fiance is present for comforting.  His malignancy diagnosis is compounded by the fact that he has major financial issues.  We will work on these social issues with the patient moving forward.  Message sent to [facility].  He has significant right sided chest pain associated with his port placement.  His pain level is abnormal.  He notes occasional hemoptysis.  I will order a chest xray.  He has an appointment with [facility] today and he is planning on starting next week.  Hopefully palliative radiation will help improve his anorectal pain   Past Medical History  Diagnosis Date  . Rectal pain   . Arthritis   . Renal cell cancer     2012.   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     has No Known Allergies.  Current Outpatient Prescriptions on File Prior to Visit  Medication Sig Dispense Refill  . docusate sodium (COLACE) 100 MG capsule Take 1 capsule (100 mg total) by mouth every 12 (twelve) hours. 60 capsule 0  . HYDROmorphone (DILAUDID) 2 MG tablet Take 1 tablet (2 mg total) by mouth every 4 (four) hours as needed for severe pain. 90 tablet 0  . lidocaine-hydrocortisone (ANAMANTLE) 3-1 % KIT Place 1 application rectally 2 (two) times daily. For two weeks, alternate with Rectiv. 28 each 1  .  amitriptyline (ELAVIL) 10 MG tablet 2-3 po qhs (Patient not taking: Reported on 10/31/2014) 60 tablet 5   No current facility-administered medications on file prior to visit.    Past Surgical History  Procedure Laterality Date  . Hypospadias correction    . Laparoscopic partial nephrectomy  2012    right side  . Knee surgery Left     arthroscopy  . Flexible sigmoidoscopy N/A 09/30/2014    Procedure: FLEXIBLE SIGMOIDOSCOPY;  Surgeon: Danie Binder, MD;  Location: [facility];   Service: Endoscopy;  Laterality: N/A;  . Esophageal biopsy N/A 09/30/2014    Procedure: BIOPSY;  Surgeon: Danie Binder, MD;  Location: [facility];  Service: Endoscopy;  Laterality: N/A;  Anal Canal  . Flexible sigmoidoscopy N/A 10/17/2014    Procedure: FLEXIBLE SIGMOIDOSCOPY;  Surgeon: Danie Binder, MD;  Location: [facility];  Service: Endoscopy;  Laterality: N/A;  1325    Denies any headaches, dizziness, double vision, fevers, chills, night sweats, nausea, vomiting, diarrhea, constipation, chest pain, heart palpitations, shortness of breath, blood in stool, black tarry stool, urinary pain, urinary burning, urinary frequency, hematuria.   PHYSICAL EXAMINATION  ECOG PERFORMANCE STATUS: 1 - Symptomatic but completely ambulatory  Filed Vitals:   10/31/14 1035  BP: 153/82  Pulse: 85  Temp: 98.3 F (36.8 C)  Resp: 18    GENERAL:alert, well nourished, well developed, cooperative, crying, moderate distress, obese and in pain. SKIN: skin color, texture, turgor are normal, no rashes or significant lesions HEAD: Normocephalic, No masses, lesions, tenderness or abnormalities EYES: normal, PERRLA, EOMI, Conjunctiva are pink and non-injected EARS: External ears normal OROPHARYNX:lips, buccal mucosa, and tongue normal and mucous membranes are moist  NECK: supple, trachea midline LYMPH:  not examined BREAST:not examined LUNGS: CTA B/L in all lobes. HEART: not examined ABDOMEN:obese BACK: Back symmetric, no curvature. EXTREMITIES:less then 2 second capillary refill, no skin discoloration, no cyanosis  NEURO: alert & oriented x 3 with fluent speech, no focal motor/sensory deficits   LABORATORY DATA: CBC    Component Value Date/Time   WBC 13.5* 10/29/2014 1013   RBC 4.86 10/29/2014 1013   HGB 15.0 10/29/2014 1013   HCT 43.8 10/29/2014 1013   PLT 272 10/29/2014 1013   MCV 90.1 10/29/2014 1013   MCH 30.9 10/29/2014 1013   MCHC 34.2 10/29/2014 1013   RDW 12.8 10/29/2014 1013    LYMPHSABS 2.5 09/24/2014 2004   MONOABS 0.9 09/24/2014 2004   EOSABS 0.5 09/24/2014 2004   BASOSABS 0.0 09/24/2014 2004      Chemistry      Component Value Date/Time   NA 137 09/24/2014 2004   K 3.8 09/24/2014 2004   CL 106 09/24/2014 2004   CO2 24 09/24/2014 2004   BUN 10 09/24/2014 2004   CREATININE 0.80 09/24/2014 2004      Component Value Date/Time   CALCIUM 8.5* 09/24/2014 2004     Lab Results  Component Value Date   CEA 4.3 10/06/2014     RADIOGRAPHIC STUDIES:  Dg Chest 1 View  10/29/2014   CLINICAL DATA:  Post right lung biopsy, placement of Port-A-Cath, history of rectal carcinoma with lung metastasis  EXAM: CHEST  1 VIEW  COMPARISON:  Limited CT chest scan for biopsy performed today  FINDINGS: No pneumothorax is seen post biopsy. No effusion is noted. A right-sided Port-A-Cath is present with the tip overlying the mid lower SVC. The heart is borderline enlarged.  IMPRESSION: 1. No pneumothorax. 2. Right-sided Port-A-Cath tip overlies the mid  lower SVC.   Electronically Signed   By: Ivar Drape M.D.   On: 10/29/2014 13:54   Ct Chest W Contrast  10/09/2014   CLINICAL DATA:  Multiple lung nodules seen on last CT. Rectal carcinoma diagnosis 2 weeks prior. History of partial nephrectomy. Subsequent stress initial treatment evaluation.  EXAM: CT CHEST WITH CONTRAST  TECHNIQUE: Multidetector CT imaging of the chest was performed during intravenous contrast administration.  CONTRAST:  9m OMNIPAQUE IOHEXOL 300 MG/ML  SOLN  COMPARISON:  CT abdomen 09/24/2014  FINDINGS: Mediastinum/Nodes: No axillary or supraclavicular lymphadenopathy. Several small paratracheal lymph nodes which are not pathologic by size criteria. For example 7 mm subcarinal lymph node and sub mm right lower paratracheal lymph node. No hilar lymphadenopathy. No pericardial fluid.  Lungs/Pleura: Within the right lower lobe round nodule with central low-attenuation measures 17 mm on image 32, series 3. This is not  changed in size from 17 mm on recent CT of 09/24/2014.  Within the right upper lobe there are 2 adjacent noncalcified nodules measuring 5 mm and 2 mm on image 22.  Nodule within the medial lingula measures 9 mm on image 30. Nodule in the superior segment of the left lower lobe measures 5 mm on image 21. Two additional nodules at the left lung apex measuring 4 mm.  Upon further inspection there additional nodules in the right lung with a 10 mm nodule in the right middle lobe on image 30 and a 7 mm nodule the azygos esophageal recess on image 45.  Upper abdomen: Limited view of the liver, kidneys, pancreas are unremarkable. Normal adrenal glands.  Musculoskeletal: No aggressive osseous lesion.  IMPRESSION: 1. Bilateral pulmonary nodules of varying size are most consistent with pulmonary metastasis. There are approximately 6 lesions in each lung. 2. Small mediastinal lymph nodes are indeterminate.   Electronically Signed   By: SSuzy BouchardM.D.   On: 10/09/2014 09:45   Ir Fluoro Guide Cv Line Right  10/29/2014   CLINICAL DATA:  Rectal carcinoma, needs venous access for chemotherapy.  EXAM: TUNNELED PORT CATHETER PLACEMENT WITH ULTRASOUND AND FLUOROSCOPIC GUIDANCE  FLUOROSCOPY TIME:  12 seconds, 6.5 mGy  ANESTHESIA/SEDATION: Intravenous Fentanyl and Versed were administered as conscious sedation during continuous cardiorespiratory monitoring by the radiology RN, with a total moderate sedation time of less than 30 minutes.  TECHNIQUE: The procedure, risks, benefits, and alternatives were explained to the patient. Questions regarding the procedure were encouraged and answered. The patient understands and consents to the procedure. As antibiotic prophylaxis, cefazolin 1 g was ordered pre-procedure and administered intravenously within one hour of incision. Patency of the right IJ vein was confirmed with ultrasound with image documentation. An appropriate skin site was determined. Skin site was marked. Region was  prepped using maximum barrier technique including cap and mask, sterile gown, sterile gloves, large sterile sheet, and Chlorhexidine as cutaneous antisepsis. The region was infiltrated locally with 1% lidocaine. Under real-time ultrasound guidance, the right IJ vein was accessed with a 21 gauge micropuncture needle; the needle tip within the vein was confirmed with ultrasound image documentation. Needle was exchanged over a 018 guidewire for transitional dilator which allowed passage of the [facility] into the IVC. Over this, the transitional dilator was exchanged for a 5 [place] catheter. A small incision was made on the right anterior chest wall and a subcutaneous pocket fashioned. The power-injectable port was positioned and its catheter tunneled to the right IJ dermatotomy site. The MPA catheter was exchanged over an Amplatz  wire for a peel-away sheath, through which the port catheter, which had been trimmed to the appropriate length, was advanced and positioned under fluoroscopy with its tip at the cavoatrial junction. Spot chest radiograph confirms good catheter position and no pneumothorax. The pocket was closed with deep interrupted and subcuticular continuous 3-0 Monocryl sutures. The port was flushed per protocol. The incisions were covered with Dermabond then covered with a sterile dressing.  COMPLICATIONS: COMPLICATIONS None immediate  IMPRESSION: Technically successful right IJ power-injectable port catheter placement. Ready for routine use.   Electronically Signed   By: Lucrezia Europe M.D.   On: 10/29/2014 16:06   Ir US Guide Vasc Access Right  10/29/2014   CLINICAL DATA:  Rectal carcinoma, needs venous access for chemotherapy.  EXAM: TUNNELED PORT CATHETER PLACEMENT WITH ULTRASOUND AND FLUOROSCOPIC GUIDANCE  FLUOROSCOPY TIME:  12 seconds, 6.5 mGy  ANESTHESIA/SEDATION: Intravenous Fentanyl and Versed were administered as conscious sedation during continuous cardiorespiratory monitoring by the  radiology RN, with a total moderate sedation time of less than 30 minutes.  TECHNIQUE: The procedure, risks, benefits, and alternatives were explained to the patient. Questions regarding the procedure were encouraged and answered. The patient understands and consents to the procedure. As antibiotic prophylaxis, cefazolin 1 g was ordered pre-procedure and administered intravenously within one hour of incision. Patency of the right IJ vein was confirmed with ultrasound with image documentation. An appropriate skin site was determined. Skin site was marked. Region was prepped using maximum barrier technique including cap and mask, sterile gown, sterile gloves, large sterile sheet, and Chlorhexidine as cutaneous antisepsis. The region was infiltrated locally with 1% lidocaine. Under real-time ultrasound guidance, the right IJ vein was accessed with a 21 gauge micropuncture needle; the needle tip within the vein was confirmed with ultrasound image documentation. Needle was exchanged over a 018 guidewire for transitional dilator which allowed passage of the [facility] wire into the IVC. Over this, the transitional dilator was exchanged for a 5 [place] MPA catheter. A small incision was made on the right anterior chest wall and a subcutaneous pocket fashioned. The power-injectable port was positioned and its catheter tunneled to the right IJ dermatotomy site. The MPA catheter was exchanged over an Amplatz wire for a peel-away sheath, through which the port catheter, which had been trimmed to the appropriate length, was advanced and positioned under fluoroscopy with its tip at the cavoatrial junction. Spot chest radiograph confirms good catheter position and no pneumothorax. The pocket was closed with deep interrupted and subcuticular continuous 3-0 Monocryl sutures. The port was flushed per protocol. The incisions were covered with Dermabond then covered with a sterile dressing.  COMPLICATIONS: COMPLICATIONS None immediate   IMPRESSION: Technically successful right IJ power-injectable port catheter placement. Ready for routine use.   Electronically Signed   By: Lucrezia Europe M.D.   On: 10/29/2014 16:06   Ct Biopsy  10/29/2014   CLINICAL DATA:  History of renal carcinoma post nephrectomy. Recent diagnosis of rectal carcinoma. Multiple pulmonary nodules.  EXAM: CT GUIDED CORE BIOPSY OF RIGHT LOWER LOBE LUNG NODULE  ANESTHESIA/SEDATION: Intravenous Fentanyl and Versed were administered as conscious sedation during continuous cardiorespiratory monitoring by the radiology RN, with a total moderate sedation time of 20 minutes.  PROCEDURE: The procedure risks, benefits, and alternatives were explained to the patient. Questions regarding the procedure were encouraged and answered. The patient understands and consents to the procedure.  Patient placed in right lateral decubitus position. Select axial scans through the thorax were obtained. The target right  lower lobe lesion was localized and an appropriate skin entry site determined and marked.  The operative field was prepped with Betadinein a sterile fashion, and a sterile drape was applied covering the operative field. A sterile gown and sterile gloves were used for the procedure. Local anesthesia was provided with 1% Lidocaine.  Under CT fluoroscopic guidance, a 17 gauge trocar needle was advanced to the margin of the lesion. Once needle tip position was confirmed, coaxial 18-gauge core biopsy samples were obtained, submitted in formalin to surgical pathology. The guide needle was removed. Postprocedure scans demonstrate regional alveolar hemorrhage. There is transient self-limited hemoptysis.  COMPLICATIONS: Mild hemoptysis, self-limited. SIR level A: No therapy, no consequence.  FINDINGS: Limited scanning again confirms Bilateral pulmonary nodules. The dominant cavitary posterior right lower lobe nodule was identified and percutaneous core biopsy samples obtained.  IMPRESSION: 1.  Technically successful CT-guided core biopsy of right lower lobe lung nodule. Follow-up chest radiography and observation is scheduled.   Electronically Signed   By: Lucrezia Europe M.D.   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    S/P port placement on 6/29.  Planning underway with Rad Onc for palliative radiation therapy for anorectal pain control.  He is planned to start on Tuesday, 7/6.  We will plan on starting FOLFOX therapy on 11/05/2014.  We will hold Avastin x 1-2 cycles due to recent port placement.  Refill on Pain Med(s)  Chest xray for significant right sided chest pain associated with port placement and report of hemoptysis.  CSW consulted.  Patient educated of his Stage IV disease and the incurability of this disease.  He was very upset with the news.  Return in 2 weeks for nadir check following cycle 1 of therapy.      THERAPY PLAN:  Palliative radiation with systemic chemotherapy beginning next week with FOLFOX; Avastin will be added on cycle 2 or 3.  All questions were answered. The patient knows to call the clinic with any problems, questions or concerns. We can certainly see the patient much sooner if necessary.  Patient and plan discussed with Dr. Ancil Linsey and she is in agreement with the aforementioned.   This note is electronically signed by: Robynn Pane, PA-C 10/31/2014 1:08 PM

## 2014-10-31 NOTE — Patient Instructions (Addendum)
[place] at [facility] Discharge Instructions  RECOMMENDATIONS MADE BY THE CONSULTANT AND ANY TEST RESULTS WILL BE SENT TO YOUR REFERRING PHYSICIAN.  Exam today per Dr. [name] and Kirby Crigler PA-C Chest x-ray today Resources reviewed with patient re: transportation and financial problems Return next week for chemotherapy  We are giving you information on all the drugs and Hildred Alamin our Nurse Navigator will sit down with you that day and go over all the medications, side-effects and what to expect  Angie and our social worker are working on transportation and financial issues  Thank you for choosing [place] at [facility] to provide your oncology and hematology care.  To afford each patient quality time with our provider, please arrive at least 15 minutes before your scheduled appointment time.    You need to re-schedule your appointment should you arrive 10 or more minutes late.  We strive to give you quality time with our providers, and arriving late affects you and other patients whose appointments are after yours.  Also, if you no show three or more times for appointments you may be dismissed from the clinic at the providers discretion.     Again, thank you for choosing [facility].  Our hope is that these requests will decrease the amount of time that you wait before being seen by our physicians.       _____________________________________________________________  Should you have questions after your visit to [facility], please contact our office at [phone] between the hours of 8:30 a.m. and 4:30 p.m.  Voicemails left after 4:30 p.m. will not be returned until the following business day.  For prescription refill requests, have your pharmacy contact our office.      Chemotherapy Many people are apprehensive about chemotherapy due to concerns over uncomfortable side effects. However, managements for side  effects have come a long way. Many side effects once associated with chemotherapy can be prevented and/or controlled. WHAT IS CHEMOTHERAPY? Chemotherapy is the general term for any treatment involving the use of chemical agents. Chemotherapy can be given through a vein, most commonly through an implanted port* or PICC line.* It can also be delivered by mouth (orally) in the form of a pill. The main goal of chemotherapy is to kill cancer cells and stop them from growing. It can destroy and eliminate cancer cells where the cancer started (primary tumor location) and throughout the body, often far away from the original cancer. It is a treatment that not only targets the original cancer location, but also the entire body (systemic treatment) for full effect and results. Chemotherapy works by destroying cancer cells. Unfortunately, it cannot tell the difference between a cancer cell and some healthy cells. This results in the death of noncancerous cells, such as hair and blood cells. Harm to healthy cells is what causes side effects. These cells usually repair themselves after chemotherapy. Because some drugs work better together rather than alone, 2 or more drugs are often given at the same time. This is called combination chemotherapy. Depending on the type of cancer and how advanced it is, chemotherapy can be used for different goals:  Cure the cancer.  Keep the cancer from spreading.  Slow the cancer's growth.  Kill cancer cells that may have spread to other parts of the body from the original tumor.  Relieve symptoms caused by cancer. You and your caregiver will decide what drug or combination of drugs you  will get. Your caregiver will choose the doses, how the drugs will be given, how often, and how long you will get treatment. All of these decisions will depend on the type of cancer, where it is, how big it is, and how it is affecting your normal body functions and overall health. *Implanted port  - A device that is implanted under your skin so that medicines may be delivered directly into your blood system. *PICC line (peripherally inserted central catheter) - A long, slender, flexible tube. This tube is often inserted into a vein, typically in the upper arm. The tip stops in the large central vein that leads to your heart. Document Released: 02/13/2007 Document Revised: 07/11/2011 Document Reviewed: 07/31/2008 [facility] Patient Information 2015 [place], [place]. This information is not intended to replace advice given to you by your health care provider. Make sure you discuss any questions you have with your health care provider. Fluorouracil, 5-FU injection What is this medicine? FLUOROURACIL, 5-FU (flure oh YOOR a sil) is a chemotherapy drug. It slows the growth of cancer cells. This medicine is used to treat many types of cancer like breast cancer, colon or rectal cancer, pancreatic cancer, and stomach cancer. This medicine may be used for other purposes; ask your health care provider or pharmacist if you have questions. COMMON BRAND NAME(S): Adrucil What should I tell my health care provider before I take this medicine? They need to know if you have any of these conditions: -blood disorders -dihydropyrimidine dehydrogenase (DPD) deficiency -infection (especially a virus infection such as chickenpox, cold sores, or herpes) -kidney disease -liver disease -malnourished, poor nutrition -recent or ongoing radiation therapy -an unusual or allergic reaction to fluorouracil, other chemotherapy, other medicines, foods, dyes, or preservatives -pregnant or trying to get pregnant -breast-feeding How should I use this medicine? This drug is given as an infusion or injection into a vein. It is administered in a hospital or clinic by a specially trained health care professional. Talk to your pediatrician regarding the use of this medicine in children. Special care may be needed. Overdosage: If  you think you have taken too much of this medicine contact a poison control center or emergency room at once. NOTE: This medicine is only for you. Do not share this medicine with others. What if I miss a dose? It is important not to miss your dose. Call your doctor or health care professional if you are unable to keep an appointment. What may interact with this medicine? -allopurinol -cimetidine -dapsone -digoxin -hydroxyurea -leucovorin -levamisole -medicines for seizures like ethotoin, fosphenytoin, phenytoin -medicines to increase blood counts like filgrastim, pegfilgrastim, sargramostim -medicines that treat or prevent blood clots like warfarin, enoxaparin, and dalteparin -methotrexate -metronidazole -pyrimethamine -some other chemotherapy drugs like busulfan, cisplatin, estramustine, vinblastine -trimethoprim -trimetrexate -vaccines Talk to your doctor or health care professional before taking any of these medicines: -acetaminophen -aspirin -ibuprofen -ketoprofen -naproxen This list may not describe all possible interactions. Give your health care provider a list of all the medicines, herbs, non-prescription drugs, or dietary supplements you use. Also tell them if you smoke, drink alcohol, or use illegal drugs. Some items may interact with your medicine. What should I watch for while using this medicine? Visit your doctor for checks on your progress. This drug may make you feel generally unwell. This is not uncommon, as chemotherapy can affect healthy cells as well as cancer cells. Report any side effects. Continue your course of treatment even though you feel ill unless your doctor tells you  to stop. In some cases, you may be given additional medicines to help with side effects. Follow all directions for their use. Call your doctor or health care professional for advice if you get a fever, chills or sore throat, or other symptoms of a cold or flu. Do not treat yourself. This  drug decreases your body's ability to fight infections. Try to avoid being around people who are sick. This medicine may increase your risk to bruise or bleed. Call your doctor or health care professional if you notice any unusual bleeding. Be careful brushing and flossing your teeth or using a toothpick because you may get an infection or bleed more easily. If you have any dental work done, tell your dentist you are receiving this medicine. Avoid taking products that contain aspirin, acetaminophen, ibuprofen, naproxen, or ketoprofen unless instructed by your doctor. These medicines may hide a fever. Do not become pregnant while taking this medicine. Women should inform their doctor if they wish to become pregnant or think they might be pregnant. There is a potential for serious side effects to an unborn child. Talk to your health care professional or pharmacist for more information. Do not breast-feed an infant while taking this medicine. Men should inform their doctor if they wish to father a child. This medicine may lower sperm counts. Do not treat diarrhea with over the counter products. Contact your doctor if you have diarrhea that lasts more than 2 days or if it is severe and watery. This medicine can make you more sensitive to the sun. Keep out of the sun. If you cannot avoid being in the sun, wear protective clothing and use sunscreen. Do not use sun lamps or tanning beds/booths. What side effects may I notice from receiving this medicine? Side effects that you should report to your doctor or health care professional as soon as possible: -allergic reactions like skin rash, itching or hives, swelling of the face, lips, or tongue -low blood counts - this medicine may decrease the number of white blood cells, red blood cells and platelets. You may be at increased risk for infections and bleeding. -signs of infection - fever or chills, cough, sore throat, pain or difficulty passing urine -signs of  decreased platelets or bleeding - bruising, pinpoint red spots on the skin, black, tarry stools, blood in the urine -signs of decreased red blood cells - unusually weak or tired, fainting spells, lightheadedness -breathing problems -changes in vision -chest pain -mouth sores -nausea and vomiting -pain, swelling, redness at site where injected -pain, tingling, numbness in the hands or feet -redness, swelling, or sores on hands or feet -stomach pain -unusual bleeding Side effects that usually do not require medical attention (report to your doctor or health care professional if they continue or are bothersome): -changes in finger or toe nails -diarrhea -dry or itchy skin -hair loss -headache -loss of appetite -sensitivity of eyes to the light -stomach upset -unusually teary eyes This list may not describe all possible side effects. Call your doctor for medical advice about side effects. You may report side effects to FDA at 1-800-FDA-1088. Where should I keep my medicine? This drug is given in a hospital or clinic and will not be stored at home. NOTE: This sheet is a summary. It may not cover all possible information. If you have questions about this medicine, talk to your doctor, pharmacist, or health care provider.  2015, Elsevier/Gold Standard. (2007-08-22 13:53:16) Leucovorin injection What is this medicine? LEUCOVORIN (loo koe VOR in)  is used to prevent or treat the harmful effects of some medicines. This medicine is used to treat anemia caused by a low amount of folic acid in the body. It is also used with 5-fluorouracil (5-FU) to treat colon cancer. This medicine may be used for other purposes; ask your health care provider or pharmacist if you have questions. What should I tell my health care provider before I take this medicine? They need to know if you have any of these conditions: -anemia from low levels of vitamin B-12 in the blood -an unusual or allergic reaction to  leucovorin, folic acid, other medicines, foods, dyes, or preservatives -pregnant or trying to get pregnant -breast-feeding How should I use this medicine? This medicine is for injection into a muscle or into a vein. It is given by a health care professional in a hospital or clinic setting. Talk to your pediatrician regarding the use of this medicine in children. Special care may be needed. Overdosage: If you think you have taken too much of this medicine contact a poison control center or emergency room at once. NOTE: This medicine is only for you. Do not share this medicine with others. What if I miss a dose? This does not apply. What may interact with this medicine? -capecitabine -fluorouracil -phenobarbital -phenytoin -primidone -trimethoprim-sulfamethoxazole This list may not describe all possible interactions. Give your health care provider a list of all the medicines, herbs, non-prescription drugs, or dietary supplements you use. Also tell them if you smoke, drink alcohol, or use illegal drugs. Some items may interact with your medicine. What should I watch for while using this medicine? Your condition will be monitored carefully while you are receiving this medicine. This medicine may increase the side effects of 5-fluorouracil, 5-FU. Tell your doctor or health care professional if you have diarrhea or mouth sores that do not get better or that get worse. What side effects may I notice from receiving this medicine? Side effects that you should report to your doctor or health care professional as soon as possible: -allergic reactions like skin rash, itching or hives, swelling of the face, lips, or tongue -breathing problems -fever, infection -mouth sores -unusual bleeding or bruising -unusually weak or tired Side effects that usually do not require medical attention (report to your doctor or health care professional if they continue or are bothersome): -constipation or  diarrhea -loss of appetite -nausea, vomiting This list may not describe all possible side effects. Call your doctor for medical advice about side effects. You may report side effects to FDA at 1-800-FDA-1088. Where should I keep my medicine? This drug is given in a hospital or clinic and will not be stored at home. NOTE: This sheet is a summary. It may not cover all possible information. If you have questions about this medicine, talk to your doctor, pharmacist, or health care provider.  2015, Elsevier/Gold Standard. (2007-10-23 16:50:29) Bevacizumab injection What is this medicine? BEVACIZUMAB (be va SIZ yoo mab) is a chemotherapy drug. It targets a protein found in many cancer cell types, and halts cancer growth. This drug treats many cancers including non-small cell lung cancer, ovarian cancer, cervical cancer, and colon or rectal cancer. It is usually given with other chemotherapy drugs. This medicine may be used for other purposes; ask your health care provider or pharmacist if you have questions. COMMON BRAND NAME(S): Avastin What should I tell my health care provider before I take this medicine? They need to know if you have any of these conditions: -  blood clots -heart disease, including heart failure, heart attack, or chest pain (angina) -high blood pressure -infection (especially a virus infection such as chickenpox, cold sores, or herpes) -kidney disease -lung disease -prior chemotherapy with doxorubicin, daunorubicin, epirubicin, or other anthracycline type chemotherapy agents -recent or ongoing radiation therapy -recent surgery -stroke -an unusual or allergic reaction to bevacizumab, hamster proteins, mouse proteins, other medicines, foods, dyes, or preservatives -pregnant or trying to get pregnant -breast-feeding How should I use this medicine? This medicine is for infusion into a vein. It is given by a health care professional in a hospital or clinic setting. Talk to your  pediatrician regarding the use of this medicine in children. Special care may be needed. Overdosage: If you think you have taken too much of this medicine contact a poison control center or emergency room at once. NOTE: This medicine is only for you. Do not share this medicine with others. What if I miss a dose? It is important not to miss your dose. Call your doctor or health care professional if you are unable to keep an appointment. What may interact with this medicine? Interactions are not expected. This list may not describe all possible interactions. Give your health care provider a list of all the medicines, herbs, non-prescription drugs, or dietary supplements you use. Also tell them if you smoke, drink alcohol, or use illegal drugs. Some items may interact with your medicine. What should I watch for while using this medicine? Your condition will be monitored carefully while you are receiving this medicine. You will need important blood work and urine testing done while you are taking this medicine. During your treatment, let your health care professional know if you have any unusual symptoms, such as difficulty breathing. This medicine may rarely cause 'gastrointestinal perforation' (holes in the stomach, intestines or colon), a serious side effect requiring surgery to repair. This medicine should be started at least 28 days following major surgery and the site of the surgery should be totally healed. Check with your doctor before scheduling dental work or surgery while you are receiving this treatment. Talk to your doctor if you have recently had surgery or if you have a wound that has not healed. Do not become pregnant while taking this medicine. Women should inform their doctor if they wish to become pregnant or think they might be pregnant. There is a potential for serious side effects to an unborn child. Talk to your health care professional or pharmacist for more information. Do not  breast-feed an infant while taking this medicine. This medicine has caused ovarian failure in some women. This medicine may interfere with the ability to have a child. You should talk to your doctor or health care professional if you are concerned about your fertility. What side effects may I notice from receiving this medicine? Side effects that you should report to your doctor or health care professional as soon as possible: -allergic reactions like skin rash, itching or hives, swelling of the face, lips, or tongue -signs of infection - fever or chills, cough, sore throat, pain or trouble passing urine -signs of decreased platelets or bleeding - bruising, pinpoint red spots on the skin, black, tarry stools, nosebleeds, blood in the urine -breathing problems -changes in vision -chest pain -confusion -jaw pain, especially after dental work -mouth sores -seizures -severe abdominal pain -severe headache -sudden numbness or weakness of the face, arm or leg -swelling of legs or ankles -symptoms of a stroke: change in mental awareness, inability to  talk or move one side of the body (especially in patients with lung cancer) -trouble passing urine or change in the amount of urine -trouble speaking or understanding -trouble walking, dizziness, loss of balance or coordination Side effects that usually do not require medical attention (report to your doctor or health care professional if they continue or are bothersome): -constipation -diarrhea -dry skin -headache -loss of appetite -nausea, vomiting This list may not describe all possible side effects. Call your doctor for medical advice about side effects. You may report side effects to FDA at 1-800-FDA-1088. Where should I keep my medicine? This drug is given in a hospital or clinic and will not be stored at home. NOTE: This sheet is a summary. It may not cover all possible information. If you have questions about this medicine, talk to your  doctor, pharmacist, or health care provider.  2015, Elsevier/Gold Standard. (2013-03-19 11:38:34)

## 2014-10-31 NOTE — Assessment & Plan Note (Signed)
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    S/P port placement on 6/29.  Planning underway with Rad Onc for palliative radiation therapy for anorectal pain control.  He is planned to start on Tuesday, 7/6.  We will plan on starting FOLFOX therapy on 11/05/2014.  We will hold Avastin x 1-2 cycles due to recent port placement.  Refill on Pain Med(s)  Chest xray for significant right sided chest pain associated with port placement and report of hemoptysis.  CSW consulted.  Patient educated of his Stage IV disease and the incurability of this disease.  He was very upset with the news.  Return in 2 weeks for nadir check following cycle 1 of therapy.

## 2014-10-31 NOTE — Progress Notes (Signed)
[place] Clinical Social Work  Clinical Social Work was referred by Engineer, [employer] for assessment of psychosocial needs due to transportation and financial concerns.  Clinical Social Worker contacted patient via phone and left vm describing resources to help with transportation and that [place] would meet with him at appt on 11/05/14 to further address financial concerns. Pt will qualify for ss disability based on diagnosis and would be encouraged to apply. CSW to further address concerns on 11/05/14.   Clinical Social Work interventions: Resource education  Loren Racer, [place] Tuesdays 8:30-1pm Wednesdays 8:30-12pm  [phone]

## 2014-11-02 ENCOUNTER — Other Ambulatory Visit (HOSPITAL_COMMUNITY): Payer: Self-pay | Admitting: *Deleted

## 2014-11-02 MED ORDER — PROCHLORPERAZINE MALEATE 10 MG PO TABS: 10.0000 mg | ORAL_TABLET | Freq: Four times a day (QID) | ORAL | Status: DC | PRN

## 2014-11-02 MED ORDER — LIDOCAINE-PRILOCAINE 2.5-2.5 % EX CREA: TOPICAL_CREAM | CUTANEOUS | Status: DC

## 2014-11-02 MED ORDER — ONDANSETRON HCL 8 MG PO TABS: 8.0000 mg | ORAL_TABLET | Freq: Three times a day (TID) | ORAL | Status: DC | PRN

## 2014-11-02 NOTE — Patient Instructions (Signed)
Andrew Kline   CHEMOTHERAPY INSTRUCTIONS  Premeds: Zofran - for nausea/vomiting prevention/reduction. Dexamethasone - steroid - given to reduce the risk of you having an allergic type reaction to the chemotherapy. Dex can cause you to feel energized, nervous/anxious/jittery, make you have trouble sleeping, and/or make you feel hot/flushed in the face/neck and/or look pink/red in the face/neck. These side effects will pass as the Dex wears off. (takes 30 minutes to infuse)  Oxaliplatin - anaphylactic reaction, neurotoxicity (i.e., headache, fatigue, difficulty sleeping, pain). Peripheral neuropathy (numbness/tingling/burning in hands/fingers/feet/toes) - will be aggravated by cold/cool temperatures. We need to know when you develop peripheral neuropathy so that we can monitor it and treat if necessary. Nausea/vomiting, diarrhea, bone marrow suppression (lowers white blood cells (fight infection), lowers red blood cells (make up your blood), lowers platelets (help blood to clot). Pulmonary fibrosis. Once you have received Oxaliplatin do NOT eat or drinking anything cold/cool for 5-10 days! Do NOT breathe in cold/cool air and do NOT touch anything cold for 5-10 days. The time frame varies from patient to patient on the length of time you must abstain from the above mentioned. Best advice is to wait at least 5 days before attempting to reintroduce cold/cool back into life. Slowly reintroduce cool/cold things! Wear gloves when getting items out of the refrigerator (of course, these would be things you are going to heat to eat)!      (takes 2 hours to infuse)  Leucovorin - this is a medication that is not chemo but given with chemo. This med "rescues" the healthy cells before we administer the drug 5FU. This makes the 5FU work better. (takes 2 hours to infuse)  Avastin - this medication is considered an anti-angiogenic therapy. Avastin is thought to work by causing the blood  vessels to shrink away from the tumor, blocking the supply of oxygen and nutrients that the tumor needs to grow. Avastin may also cause the existing blood vessels to change in ways that help the chemotherapy reach the tumor more effectively. Avastin may also work to interfere with the growth of new blood vessels, causing the tumor to starve. Side Effects: Nosebleeds, High Blood Pressure, Protein in Urine, & diarrhea. (takes 30 minutes to infuse)  5FU -- bone marrow suppression (low white blood cells - wbcs fight infection, low red blood cells - rbcs make up your blood, low platelets - this is what makes your blood clot, nausea/vomiting, diarrhea, mouth sores, hair loss, dry skin, ocular toxicities (increased tear production, sensitivity to light). You must wear sunscreen/sunglasses. Cover your skin when out in sunlight. You will get burned very easily. The nurse will sit @ your bedside and push this medication in via a syringe. This will take 10 minutes. Then she will connect you to an ambulatory pump with this medication attached. You will wear the pump for 46 hours. The 5FU chemo will infuse for 46 hours total. Then the pump will be removed).    POTENTIAL SIDE EFFECTS OF TREATMENT: Increased Susceptibility to Infection, Vomiting, Constipation, Hair Thinning, Changes in Character of Skin and Nails (brittleness, dryness,etc.), Bone Marrow Suppression, Nausea, Diarrhea, Sun Sensitivity and Mouth Sores   SELF IMAGE NEEDS AND REFERRALS MADE: Obtain hair accessories as soon as possible (wigs, scarves, turbans,caps,etc.)   EDUCATIONAL MATERIALS GIVEN AND REVIEWED: Chemotherapy and You Book Specific Instructions Sheets: Zofran, Dexamethasone, Oxaliplatin, Leucovorin, Avastin, 5FU, Zofran pills, Compazine pills, EMLA cream   SELF CARE ACTIVITIES WHILE ON CHEMOTHERAPY: Increase your fluid intake 48  hours prior to treatment and drink at least 2 quarts per day after treatment., No alcohol intake., No  aspirin or other medications unless approved by your oncologist., Eat foods that are light and easy to digest., Eat foods at cold or room temperature., No fried, fatty, or spicy foods immediately before or after treatment., Have teeth cleaned professionally before starting treatment. Keep dentures and partial plates clean., Use soft toothbrush and do not use mouthwashes that contain alcohol. Biotene is a good mouthwash that is available at most pharmacies or may be ordered by calling [phone]., Use warm salt water gargles (1 teaspoon salt per 1 quart warm water) before and after meals and at bedtime. Or you may rinse with 2 tablespoons of three -percent hydrogen peroxide mixed in eight ounces of water., Always use sunscreen with SPF (Sun Protection Factor) of 30 or higher., Use your nausea medication as directed to prevent nausea. and Use your stool softener or laxative as directed to prevent constipation. Use your anti-diarrheal if you get loose stools.  Please wash your hands for at least 30 seconds using warm soapy water. Handwashing is the #1 way to prevent the spread of germs. Stay away from sick people or people who are getting over a cold. If you develop respiratory systems such as green/yellow mucus production or productive cough or persistent cough let us know and we will see if you need an antibiotic. It is a good idea to keep a pair of gloves on when going into grocery stores/[employer] to decrease your risk of coming into contact with germs on the carts, etc. Carry alcohol hand gel with you at all times and use it frequently if out in public. All foods need to be cooked thoroughly. No raw foods. No medium or undercooked meats, eggs. If your food is cooked medium well, it does not need to be hot pink or saturated with bloody liquid at all. Vegetables and fruits need to be washed/rinsed under the faucet with a dish detergent before being consumed. You can eat raw fruits and vegetables unless we tell  you otherwise but it would be best if you cooked them or bought frozen. Do not eat off of salad bars or hot bars unless you really trust the cleanliness of the restaurant. If you need dental work, please let Dr. [name] know before you go for your appointment so that we can coordinate the best possible time for you in regards to your chemo regimen. You need to also let your dentist know that you are actively taking chemo. We may need to do labs prior to your dental appointment. We also want your bowels moving at least every other day. If this is not happening, we need to know so that we can get you on a bowel regimen to help you go.       MEDICATIONS: You have been given prescriptions for the following medications:  Zofran '8mg'$  tablet. Take 1 tablet every 8 hours as needed for nausea/vomiting. (#1 nausea med to take, this can constipate)  Compazine '10mg'$  tablet. Take 1 tablet every 6 hours as needed for nausea/vomiting. (#2 nausea med to take, this can make you sleepy)  EMLA cream. Apply a quarter size amount to port site 1 hour prior to chemo. Do not rub in. Cover with plastic wrap.   Over-the-Counter Meds:  Miralax 1 capful in 8 oz of fluid daily. May increase to two times a day if needed. This is a stool softener. If this doesn't work  proceed you can add:  Senokot S  - start with 1 tablet two times a day and increase to 4 tablets two times a day if needed. (total of 8 tablets in a 24 hour period). This is a stimulant laxative.   Call us if this does not help your bowels move.   Imodium '2mg'$  capsule. Take 2 capsules after the 1st loose stool and then 1 capsule every 2 hours until you go a total of 12 hours without having a loose stool. Call the [facility] if loose stools continue.    SYMPTOMS TO REPORT AS SOON AS POSSIBLE AFTER TREATMENT:  FEVER GREATER THAN 100.5 F  CHILLS WITH OR WITHOUT FEVER  NAUSEA AND VOMITING THAT IS NOT CONTROLLED WITH YOUR NAUSEA MEDICATION  UNUSUAL  SHORTNESS OF BREATH  UNUSUAL BRUISING OR BLEEDING  TENDERNESS IN MOUTH AND THROAT WITH OR WITHOUT PRESENCE OF ULCERS  URINARY PROBLEMS  BOWEL PROBLEMS  UNUSUAL RASH    Wear comfortable clothing and clothing appropriate for easy access to any Portacath or PICC line. Let us know if there is anything that we can do to make your therapy better!      I have been informed and understand all of the instructions given to me and have received a copy. I have been instructed to call the clinic [phone] or my family physician as soon as possible for continued medical care, if indicated. I do not have any more questions at this time but understand that I may call the [facility] or the Patient Navigator at [phone] during office hours should I have questions or need assistance in obtaining follow-up care.          Ondansetron injection What is this medicine? ONDANSETRON (on DAN se tron) is used to treat nausea and vomiting caused by chemotherapy. It is also used to prevent or treat nausea and vomiting after surgery. This medicine may be used for other purposes; ask your health care provider or pharmacist if you have questions. COMMON BRAND NAME(S): Zofran What should I tell my health care provider before I take this medicine? They need to know if you have any of these conditions: -heart disease -history of irregular heartbeat -liver disease -low levels of magnesium or potassium in the blood -an unusual or allergic reaction to ondansetron, granisetron, other medicines, foods, dyes, or preservatives -pregnant or trying to get pregnant -breast-feeding How should I use this medicine? This medicine is for infusion into a vein. It is given by a health care professional in a hospital or clinic setting. Talk to your pediatrician regarding the use of this medicine in children. Special care may be needed. Overdosage: If you think you have taken too much of this medicine contact a  poison control center or emergency room at once. NOTE: This medicine is only for you. Do not share this medicine with others. What if I miss a dose? This does not apply. What may interact with this medicine? Do not take this medicine with any of the following medications: -apomorphine -certain medicines for fungal infections like fluconazole, itraconazole, ketoconazole, posaconazole, voriconazole -cisapride -dofetilide -dronedarone -pimozide -thioridazine -ziprasidone This medicine may also interact with the following medications: -carbamazepine -certain medicines for depression, anxiety, or psychotic disturbances -fentanyl -linezolid -MAOIs like Carbex, Eldepryl, Marplan, Nardil, and Parnate -methylene blue (injected into a vein) -other medicines that prolong the QT interval (cause an abnormal heart rhythm) -phenytoin -rifampicin -tramadol This list may not describe all possible interactions. Give your health care  provider a list of all the medicines, herbs, non-prescription drugs, or dietary supplements you use. Also tell them if you smoke, drink alcohol, or use illegal drugs. Some items may interact with your medicine. What should I watch for while using this medicine? Your condition will be monitored carefully while you are receiving this medicine. What side effects may I notice from receiving this medicine? Side effects that you should report to your doctor or health care professional as soon as possible: -allergic reactions like skin rash, itching or hives, swelling of the face, lips, or tongue -breathing problems -confusion -dizziness -fast or irregular heartbeat -feeling faint or lightheaded, falls -fever and chills -loss of balance or coordination -seizures -sweating -swelling of the hands and feet -tightness in the chest -tremors -unusually weak or tired Side effects that usually do not require medical attention (report to your doctor or health care professional  if they continue or are bothersome): -constipation or diarrhea -headache This list may not describe all possible side effects. Call your doctor for medical advice about side effects. You may report side effects to FDA at 1-800-FDA-1088. Where should I keep my medicine? This drug is given in a hospital or clinic and will not be stored at home. NOTE: This sheet is a summary. It may not cover all possible information. If you have questions about this medicine, talk to your doctor, pharmacist, or health care provider.  2015, [employer]vier/Gold Standard. (2013-01-23 16:18:28) Dexamethasone injection What is this medicine? DEXAMETHASONE (dex a METH a sone) is a corticosteroid. It is used to treat inflammation of the skin, joints, lungs, and other organs. Common conditions treated include asthma, allergies, and arthritis. It is also used for other conditions, like blood disorders and diseases of the adrenal glands. This medicine may be used for other purposes; ask your health care provider or pharmacist if you have questions. COMMON BRAND NAME(S): Decadron, Solurex What should I tell my health care provider before I take this medicine? They need to know if you have any of these conditions: -blood clotting problems -Cushing's syndrome -diabetes -glaucoma -heart problems or disease -high blood pressure -infection like herpes, measles, tuberculosis, or chickenpox -kidney disease -liver disease -mental problems -myasthenia gravis -osteoporosis -previous heart attack -seizures -stomach, ulcer or intestine disease including colitis and diverticulitis -thyroid problem -an unusual or allergic reaction to dexamethasone, corticosteroids, other medicines, lactose, foods, dyes, or preservatives -pregnant or trying to get pregnant -breast-feeding How should I use this medicine? This medicine is for injection into a muscle, joint, lesion, soft tissue, or vein. It is given by a health care professional in a  hospital or clinic setting. Talk to your pediatrician regarding the use of this medicine in children. Special care may be needed. Overdosage: If you think you have taken too much of this medicine contact a poison control center or emergency room at once. NOTE: This medicine is only for you. Do not share this medicine with others. What if I miss a dose? This may not apply. If you are having a series of injections over a prolonged period, try not to miss an appointment. Call your doctor or health care professional to reschedule if you are unable to keep an appointment. What may interact with this medicine? Do not take this medicine with any of the following medications: -mifepristone, RU-486 -vaccines This medicine may also interact with the following medications: -amphotericin B -antibiotics like clarithromycin, erythromycin, and troleandomycin -aspirin and aspirin-like drugs -barbiturates like phenobarbital -carbamazepine -cholestyramine -cholinesterase inhibitors like donepezil, galantamine, rivastigmine,  and tacrine -cyclosporine -digoxin -diuretics -ephedrine -male hormones, like estrogens or progestins and birth control pills -indinavir -isoniazid -ketoconazole -medicines for diabetes -medicines that improve muscle tone or strength for conditions like myasthenia gravis -NSAIDs, medicines for pain and inflammation, like ibuprofen or naproxen -phenytoin -rifampin -thalidomide -warfarin This list may not describe all possible interactions. Give your health care provider a list of all the medicines, herbs, non-prescription drugs, or dietary supplements you use. Also tell them if you smoke, drink alcohol, or use illegal drugs. Some items may interact with your medicine. What should I watch for while using this medicine? Your condition will be monitored carefully while you are receiving this medicine. If you are taking this medicine for a long time, carry an identification card  with your name and address, the type and dose of your medicine, and your doctor's name and address. This medicine may increase your risk of getting an infection. Stay away from people who are sick. Tell your doctor or health care professional if you are around anyone with measles or chickenpox. Talk to your health care provider before you get any vaccines that you take this medicine. If you are going to have surgery, tell your doctor or health care professional that you have taken this medicine within the last twelve months. Ask your doctor or health care professional about your diet. You may need to lower the amount of salt you eat. The medicine can increase your blood sugar. If you are a diabetic check with your doctor if you need help adjusting the dose of your diabetic medicine. What side effects may I notice from receiving this medicine? Side effects that you should report to your doctor or health care professional as soon as possible: -allergic reactions like skin rash, itching or hives, swelling of the face, lips, or tongue -black or tarry stools -change in the amount of urine -changes in vision -confusion, excitement, restlessness, a false sense of well-being -fever, sore throat, sneezing, cough, or other signs of infection, wounds that will not heal -hallucinations -increased thirst -mental depression, mood swings, mistaken feelings of self importance or of being mistreated -pain in hips, back, ribs, arms, shoulders, or legs -pain, redness, or irritation at the injection site -redness, blistering, peeling or loosening of the skin, including inside the mouth -rounding out of face -swelling of feet or lower legs -unusual bleeding or bruising -unusual tired or weak -wounds that do not heal Side effects that usually do not require medical attention (report to your doctor or health care professional if they continue or are bothersome): -diarrhea or constipation -change in  taste -headache -nausea, vomiting -skin problems, acne, thin and shiny skin -touble sleeping -unusual growth of hair on the face or body -weight gain This list may not describe all possible side effects. Call your doctor for medical advice about side effects. You may report side effects to FDA at 1-800-FDA-1088. Where should I keep my medicine? This drug is given in a hospital or clinic and will not be stored at home. NOTE: This sheet is a summary. It may not cover all possible information. If you have questions about this medicine, talk to your doctor, pharmacist, or health care provider.  2015, [employer]/Gold Standard. (2007-08-09 14:04:12) Oxaliplatin Injection What is this medicine? OXALIPLATIN (ox AL i PLA tin) is a chemotherapy drug. It targets fast dividing cells, like cancer cells, and causes these cells to die. This medicine is used to treat cancers of the colon and rectum, and many other cancers. This  medicine may be used for other purposes; ask your health care provider or pharmacist if you have questions. COMMON BRAND NAME(S): Eloxatin What should I tell my health care provider before I take this medicine? They need to know if you have any of these conditions: -kidney disease -an unusual or allergic reaction to oxaliplatin, other chemotherapy, other medicines, foods, dyes, or preservatives -pregnant or trying to get pregnant -breast-feeding How should I use this medicine? This drug is given as an infusion into a vein. It is administered in a hospital or clinic by a specially trained health care professional. Talk to your pediatrician regarding the use of this medicine in children. Special care may be needed. Overdosage: If you think you have taken too much of this medicine contact a poison control center or emergency room at once. NOTE: This medicine is only for you. Do not share this medicine with others. What if I miss a dose? It is important not to miss a dose. Call your  doctor or health care professional if you are unable to keep an appointment. What may interact with this medicine? -medicines to increase blood counts like filgrastim, pegfilgrastim, sargramostim -probenecid -some antibiotics like amikacin, gentamicin, neomycin, polymyxin B, streptomycin, tobramycin -zalcitabine Talk to your doctor or health care professional before taking any of these medicines: -acetaminophen -aspirin -ibuprofen -ketoprofen -naproxen This list may not describe all possible interactions. Give your health care provider a list of all the medicines, herbs, non-prescription drugs, or dietary supplements you use. Also tell them if you smoke, drink alcohol, or use illegal drugs. Some items may interact with your medicine. What should I watch for while using this medicine? Your condition will be monitored carefully while you are receiving this medicine. You will need important blood work done while you are taking this medicine. This medicine can make you more sensitive to cold. Do not drink cold drinks or use ice. Cover exposed skin before coming in contact with cold temperatures or cold objects. When out in cold weather wear warm clothing and cover your mouth and nose to warm the air that goes into your lungs. Tell your doctor if you get sensitive to the cold. This drug may make you feel generally unwell. This is not uncommon, as chemotherapy can affect healthy cells as well as cancer cells. Report any side effects. Continue your course of treatment even though you feel ill unless your doctor tells you to stop. In some cases, you may be given additional medicines to help with side effects. Follow all directions for their use. Call your doctor or health care professional for advice if you get a fever, chills or sore throat, or other symptoms of a cold or flu. Do not treat yourself. This drug decreases your body's ability to fight infections. Try to avoid being around people who are  sick. This medicine may increase your risk to bruise or bleed. Call your doctor or health care professional if you notice any unusual bleeding. Be careful brushing and flossing your teeth or using a toothpick because you may get an infection or bleed more easily. If you have any dental work done, tell your dentist you are receiving this medicine. Avoid taking products that contain aspirin, acetaminophen, ibuprofen, naproxen, or ketoprofen unless instructed by your doctor. These medicines may hide a fever. Do not become pregnant while taking this medicine. Women should inform their doctor if they wish to become pregnant or think they might be pregnant. There is a potential for serious side effects  to an unborn child. Talk to your health care professional or pharmacist for more information. Do not breast-feed an infant while taking this medicine. Call your doctor or health care professional if you get diarrhea. Do not treat yourself. What side effects may I notice from receiving this medicine? Side effects that you should report to your doctor or health care professional as soon as possible: -allergic reactions like skin rash, itching or hives, swelling of the face, lips, or tongue -low blood counts - This drug may decrease the number of white blood cells, red blood cells and platelets. You may be at increased risk for infections and bleeding. -signs of infection - fever or chills, cough, sore throat, pain or difficulty passing urine -signs of decreased platelets or bleeding - bruising, pinpoint red spots on the skin, black, tarry stools, nosebleeds -signs of decreased red blood cells - unusually weak or tired, fainting spells, lightheadedness -breathing problems -chest pain, pressure -cough -diarrhea -jaw tightness -mouth sores -nausea and vomiting -pain, swelling, redness or irritation at the injection site -pain, tingling, numbness in the hands or feet -problems with balance, talking,  walking -redness, blistering, peeling or loosening of the skin, including inside the mouth -trouble passing urine or change in the amount of urine Side effects that usually do not require medical attention (report to your doctor or health care professional if they continue or are bothersome): -changes in vision -constipation -hair loss -loss of appetite -metallic taste in the mouth or changes in taste -stomach pain This list may not describe all possible side effects. Call your doctor for medical advice about side effects. You may report side effects to FDA at 1-800-FDA-1088. Where should I keep my medicine? This drug is given in a hospital or clinic and will not be stored at home. NOTE: This sheet is a summary. It may not cover all possible information. If you have questions about this medicine, talk to your doctor, pharmacist, or health care provider.  2015, [employer] Standard. (2007-11-13 17:22:47) Leucovorin injection What is this medicine? LEUCOVORIN (loo koe VOR in) is used to prevent or treat the harmful effects of some medicines. This medicine is used to treat anemia caused by a low amount of folic acid in the body. It is also used with 5-fluorouracil (5-FU) to treat colon cancer. This medicine may be used for other purposes; ask your health care provider or pharmacist if you have questions. What should I tell my health care provider before I take this medicine? They need to know if you have any of these conditions: -anemia from low levels of vitamin B-12 in the blood -an unusual or allergic reaction to leucovorin, folic acid, other medicines, foods, dyes, or preservatives -pregnant or trying to get pregnant -breast-feeding How should I use this medicine? This medicine is for injection into a muscle or into a vein. It is given by a health care professional in a hospital or clinic setting. Talk to your pediatrician regarding the use of this medicine in children. Special care  may be needed. Overdosage: If you think you have taken too much of this medicine contact a poison control center or emergency room at once. NOTE: This medicine is only for you. Do not share this medicine with others. What if I miss a dose? This does not apply. What may interact with this medicine? -capecitabine -fluorouracil -phenobarbital -phenytoin -primidone -trimethoprim-sulfamethoxazole This list may not describe all possible interactions. Give your health care provider a list of all the medicines, herbs, non-prescription drugs,  or dietary supplements you use. Also tell them if you smoke, drink alcohol, or use illegal drugs. Some items may interact with your medicine. What should I watch for while using this medicine? Your condition will be monitored carefully while you are receiving this medicine. This medicine may increase the side effects of 5-fluorouracil, 5-FU. Tell your doctor or health care professional if you have diarrhea or mouth sores that do not get better or that get worse. What side effects may I notice from receiving this medicine? Side effects that you should report to your doctor or health care professional as soon as possible: -allergic reactions like skin rash, itching or hives, swelling of the face, lips, or tongue -breathing problems -fever, infection -mouth sores -unusual bleeding or bruising -unusually weak or tired Side effects that usually do not require medical attention (report to your doctor or health care professional if they continue or are bothersome): -constipation or diarrhea -loss of appetite -nausea, vomiting This list may not describe all possible side effects. Call your doctor for medical advice about side effects. You may report side effects to FDA at 1-800-FDA-1088. Where should I keep my medicine? This drug is given in a hospital or clinic and will not be stored at home. NOTE: This sheet is a summary. It may not cover all possible  information. If you have questions about this medicine, talk to your doctor, pharmacist, or health care provider.  2015, [employer]. (2007-10-23 16:50:29) Bevacizumab injection What is this medicine? BEVACIZUMAB (be va SIZ yoo mab) is a chemotherapy drug. It targets a protein found in many cancer cell types, and halts cancer growth. This drug treats many cancers including non-small cell lung cancer, ovarian cancer, cervical cancer, and colon or rectal cancer. It is usually given with other chemotherapy drugs. This medicine may be used for other purposes; ask your health care provider or pharmacist if you have questions. COMMON BRAND NAME(S): Avastin What should I tell my health care provider before I take this medicine? They need to know if you have any of these conditions: -blood clots -heart disease, including heart failure, heart attack, or chest pain (angina) -high blood pressure -infection (especially a virus infection such as chickenpox, cold sores, or herpes) -kidney disease -lung disease -prior chemotherapy with doxorubicin, daunorubicin, epirubicin, or other anthracycline type chemotherapy agents -recent or ongoing radiation therapy -recent surgery -stroke -an unusual or allergic reaction to bevacizumab, hamster proteins, mouse proteins, other medicines, foods, dyes, or preservatives -pregnant or trying to get pregnant -breast-feeding How should I use this medicine? This medicine is for infusion into a vein. It is given by a health care professional in a hospital or clinic setting. Talk to your pediatrician regarding the use of this medicine in children. Special care may be needed. Overdosage: If you think you have taken too much of this medicine contact a poison control center or emergency room at once. NOTE: This medicine is only for you. Do not share this medicine with others. What if I miss a dose? It is important not to miss your dose. Call your doctor or health  care professional if you are unable to keep an appointment. What may interact with this medicine? Interactions are not expected. This list may not describe all possible interactions. Give your health care provider a list of all the medicines, herbs, non-prescription drugs, or dietary supplements you use. Also tell them if you smoke, drink alcohol, or use illegal drugs. Some items may interact with your medicine. What should I  watch for while using this medicine? Your condition will be monitored carefully while you are receiving this medicine. You will need important blood work and urine testing done while you are taking this medicine. During your treatment, let your health care professional know if you have any unusual symptoms, such as difficulty breathing. This medicine may rarely cause 'gastrointestinal perforation' (holes in the stomach, intestines or colon), a serious side effect requiring surgery to repair. This medicine should be started at least 28 days following major surgery and the site of the surgery should be totally healed. Check with your doctor before scheduling dental work or surgery while you are receiving this treatment. Talk to your doctor if you have recently had surgery or if you have a wound that has not healed. Do not become pregnant while taking this medicine. Women should inform their doctor if they wish to become pregnant or think they might be pregnant. There is a potential for serious side effects to an unborn child. Talk to your health care professional or pharmacist for more information. Do not breast-feed an infant while taking this medicine. This medicine has caused ovarian failure in some women. This medicine may interfere with the ability to have a child. You should talk to your doctor or health care professional if you are concerned about your fertility. What side effects may I notice from receiving this medicine? Side effects that you should report to your doctor or  health care professional as soon as possible: -allergic reactions like skin rash, itching or hives, swelling of the face, lips, or tongue -signs of infection - fever or chills, cough, sore throat, pain or trouble passing urine -signs of decreased platelets or bleeding - bruising, pinpoint red spots on the skin, black, tarry stools, nosebleeds, blood in the urine -breathing problems -changes in vision -chest pain -confusion -jaw pain, especially after dental work -mouth sores -seizures -severe abdominal pain -severe headache -sudden numbness or weakness of the face, arm or leg -swelling of legs or ankles -symptoms of a stroke: change in mental awareness, inability to talk or move one side of the body (especially in patients with lung cancer) -trouble passing urine or change in the amount of urine -trouble speaking or understanding -trouble walking, dizziness, loss of balance or coordination Side effects that usually do not require medical attention (report to your doctor or health care professional if they continue or are bothersome): -constipation -diarrhea -dry skin -headache -loss of appetite -nausea, vomiting This list may not describe all possible side effects. Call your doctor for medical advice about side effects. You may report side effects to FDA at 1-800-FDA-1088. Where should I keep my medicine? This drug is given in a hospital or clinic and will not be stored at home. NOTE: This sheet is a summary. It may not cover all possible information. If you have questions about this medicine, talk to your doctor, pharmacist, or health care provider.  2015, [employer]. (2013-03-19 11:38:34) Fluorouracil, 5-FU injection What is this medicine? FLUOROURACIL, 5-FU (flure oh YOOR a sil) is a chemotherapy drug. It slows the growth of cancer cells. This medicine is used to treat many types of cancer like breast cancer, colon or rectal cancer, pancreatic cancer, and stomach  cancer. This medicine may be used for other purposes; ask your health care provider or pharmacist if you have questions. COMMON BRAND NAME(S): Adrucil What should I tell my health care provider before I take this medicine? They need to know if you have any of  these conditions: -blood disorders -dihydropyrimidine dehydrogenase (DPD) deficiency -infection (especially a virus infection such as chickenpox, cold sores, or herpes) -kidney disease -liver disease -malnourished, poor nutrition -recent or ongoing radiation therapy -an unusual or allergic reaction to fluorouracil, other chemotherapy, other medicines, foods, dyes, or preservatives -pregnant or trying to get pregnant -breast-feeding How should I use this medicine? This drug is given as an infusion or injection into a vein. It is administered in a hospital or clinic by a specially trained health care professional. Talk to your pediatrician regarding the use of this medicine in children. Special care may be needed. Overdosage: If you think you have taken too much of this medicine contact a poison control center or emergency room at once. NOTE: This medicine is only for you. Do not share this medicine with others. What if I miss a dose? It is important not to miss your dose. Call your doctor or health care professional if you are unable to keep an appointment. What may interact with this medicine? -allopurinol -cimetidine -dapsone -digoxin -hydroxyurea -leucovorin -levamisole -medicines for seizures like ethotoin, fosphenytoin, phenytoin -medicines to increase blood counts like filgrastim, pegfilgrastim, sargramostim -medicines that treat or prevent blood clots like warfarin, enoxaparin, and dalteparin -methotrexate -metronidazole -pyrimethamine -some other chemotherapy drugs like busulfan, cisplatin, estramustine, vinblastine -trimethoprim -trimetrexate -vaccines Talk to your doctor or health care professional before taking  any of these medicines: -acetaminophen -aspirin -ibuprofen -ketoprofen -naproxen This list may not describe all possible interactions. Give your health care provider a list of all the medicines, herbs, non-prescription drugs, or dietary supplements you use. Also tell them if you smoke, drink alcohol, or use illegal drugs. Some items may interact with your medicine. What should I watch for while using this medicine? Visit your doctor for checks on your progress. This drug may make you feel generally unwell. This is not uncommon, as chemotherapy can affect healthy cells as well as cancer cells. Report any side effects. Continue your course of treatment even though you feel ill unless your doctor tells you to stop. In some cases, you may be given additional medicines to help with side effects. Follow all directions for their use. Call your doctor or health care professional for advice if you get a fever, chills or sore throat, or other symptoms of a cold or flu. Do not treat yourself. This drug decreases your body's ability to fight infections. Try to avoid being around people who are sick. This medicine may increase your risk to bruise or bleed. Call your doctor or health care professional if you notice any unusual bleeding. Be careful brushing and flossing your teeth or using a toothpick because you may get an infection or bleed more easily. If you have any dental work done, tell your dentist you are receiving this medicine. Avoid taking products that contain aspirin, acetaminophen, ibuprofen, naproxen, or ketoprofen unless instructed by your doctor. These medicines may hide a fever. Do not become pregnant while taking this medicine. Women should inform their doctor if they wish to become pregnant or think they might be pregnant. There is a potential for serious side effects to an unborn child. Talk to your health care professional or pharmacist for more information. Do not breast-feed an infant while  taking this medicine. Men should inform their doctor if they wish to father a child. This medicine may lower sperm counts. Do not treat diarrhea with over the counter products. Contact your doctor if you have diarrhea that lasts more than 2 days or  if it is severe and watery. This medicine can make you more sensitive to the sun. Keep out of the sun. If you cannot avoid being in the sun, wear protective clothing and use sunscreen. Do not use sun lamps or tanning beds/booths. What side effects may I notice from receiving this medicine? Side effects that you should report to your doctor or health care professional as soon as possible: -allergic reactions like skin rash, itching or hives, swelling of the face, lips, or tongue -low blood counts - this medicine may decrease the number of white blood cells, red blood cells and platelets. You may be at increased risk for infections and bleeding. -signs of infection - fever or chills, cough, sore throat, pain or difficulty passing urine -signs of decreased platelets or bleeding - bruising, pinpoint red spots on the skin, black, tarry stools, blood in the urine -signs of decreased red blood cells - unusually weak or tired, fainting spells, lightheadedness -breathing problems -changes in vision -chest pain -mouth sores -nausea and vomiting -pain, swelling, redness at site where injected -pain, tingling, numbness in the hands or feet -redness, swelling, or sores on hands or feet -stomach pain -unusual bleeding Side effects that usually do not require medical attention (report to your doctor or health care professional if they continue or are bothersome): -changes in finger or toe nails -diarrhea -dry or itchy skin -hair loss -headache -loss of appetite -sensitivity of eyes to the light -stomach upset -unusually teary eyes This list may not describe all possible side effects. Call your doctor for medical advice about side effects. You may report  side effects to FDA at 1-800-FDA-1088. Where should I keep my medicine? This drug is given in a hospital or clinic and will not be stored at home. NOTE: This sheet is a summary. It may not cover all possible information. If you have questions about this medicine, talk to your doctor, pharmacist, or health care provider.  2015, [employer] Standard. (2007-08-22 13:53:16) Ondansetron tablets What is this medicine? ONDANSETRON (on DAN se tron) is used to treat nausea and vomiting caused by chemotherapy. It is also used to prevent or treat nausea and vomiting after surgery. This medicine may be used for other purposes; ask your health care provider or pharmacist if you have questions. COMMON BRAND NAME(S): Zofran What should I tell my health care provider before I take this medicine? They need to know if you have any of these conditions: -heart disease -history of irregular heartbeat -liver disease -low levels of magnesium or potassium in the blood -an unusual or allergic reaction to ondansetron, granisetron, other medicines, foods, dyes, or preservatives -pregnant or trying to get pregnant -breast-feeding How should I use this medicine? Take this medicine by mouth with a glass of water. Follow the directions on your prescription label. Take your doses at regular intervals. Do not take your medicine more often than directed. Talk to your pediatrician regarding the use of this medicine in children. Special care may be needed. Overdosage: If you think you have taken too much of this medicine contact a poison control center or emergency room at once. NOTE: This medicine is only for you. Do not share this medicine with others. What if I miss a dose? If you miss a dose, take it as soon as you can. If it is almost time for your next dose, take only that dose. Do not take double or extra doses. What may interact with this medicine? Do not take this medicine with any  of the following  medications: -apomorphine -certain medicines for fungal infections like fluconazole, itraconazole, ketoconazole, posaconazole, voriconazole -cisapride -dofetilide -dronedarone -pimozide -thioridazine -ziprasidone This medicine may also interact with the following medications: -carbamazepine -certain medicines for depression, anxiety, or psychotic disturbances -fentanyl -linezolid -MAOIs like Carbex, Eldepryl, Marplan, Nardil, and Parnate -methylene blue (injected into a vein) -other medicines that prolong the QT interval (cause an abnormal heart rhythm) -phenytoin -rifampicin -tramadol This list may not describe all possible interactions. Give your health care provider a list of all the medicines, herbs, non-prescription drugs, or dietary supplements you use. Also tell them if you smoke, drink alcohol, or use illegal drugs. Some items may interact with your medicine. What should I watch for while using this medicine? Check with your doctor or health care professional right away if you have any sign of an allergic reaction. What side effects may I notice from receiving this medicine? Side effects that you should report to your doctor or health care professional as soon as possible: -allergic reactions like skin rash, itching or hives, swelling of the face, lips or tongue -breathing problems -confusion -dizziness -fast or irregular heartbeat -feeling faint or lightheaded, falls -fever and chills -loss of balance or coordination -seizures -sweating -swelling of the hands or feet -tightness in the chest -tremors -unusually weak or tired Side effects that usually do not require medical attention (report to your doctor or health care professional if they continue or are bothersome): -constipation or diarrhea -headache This list may not describe all possible side effects. Call your doctor for medical advice about side effects. You may report side effects to FDA at  1-800-FDA-1088. Where should I keep my medicine? Keep out of the reach of children. Store between 2 and 30 degrees C (36 and 86 degrees F). Throw away any unused medicine after the expiration date. NOTE: This sheet is a summary. It may not cover all possible information. If you have questions about this medicine, talk to your doctor, pharmacist, or health care provider.  2015, [employer] Standard. (2013-01-23 16:27:45) Prochlorperazine tablets What is this medicine? PROCHLORPERAZINE (proe klor PER a zeen) helps to control severe nausea and vomiting. This medicine is also used to treat schizophrenia. It can also help patients who experience anxiety that is not due to psychological illness. This medicine may be used for other purposes; ask your health care provider or pharmacist if you have questions. COMMON BRAND NAME(S): Compazine What should I tell my health care provider before I take this medicine? They need to know if you have any of these conditions: -blood disorders or disease -dementia -liver disease or jaundice -Parkinson's disease -uncontrollable movement disorder -an unusual or allergic reaction to prochlorperazine, other medicines, foods, dyes, or preservatives -pregnant or trying to get pregnant -breast-feeding How should I use this medicine? Take this medicine by mouth with a glass of water. Follow the directions on the prescription label. Take your doses at regular intervals. Do not take your medicine more often than directed. Do not stop taking this medicine suddenly. This can cause nausea, vomiting, and dizziness. Ask your doctor or health care professional for advice. Talk to your pediatrician regarding the use of this medicine in children. Special care may be needed. While this drug may be prescribed for children as young as 2 years for selected conditions, precautions do apply. Overdosage: If you think you have taken too much of this medicine contact a poison control  center or emergency room at once. NOTE: This medicine is only for you.  Do not share this medicine with others. What if I miss a dose? If you miss a dose, take it as soon as you can. If it is almost time for your next dose, take only that dose. Do not take double or extra doses. What may interact with this medicine? Do not take this medicine with any of the following medications: -amoxapine -antidepressants like citalopram, escitalopram, fluoxetine, paroxetine, and sertraline -deferoxamine -dofetilide -maprotiline -tricyclic antidepressants like amitriptyline, clomipramine, imipramine, nortiptyline and others This medicine may also interact with the following medications: -lithium -medicines for pain -phenytoin -propranolol -warfarin This list may not describe all possible interactions. Give your health care provider a list of all the medicines, herbs, non-prescription drugs, or dietary supplements you use. Also tell them if you smoke, drink alcohol, or use illegal drugs. Some items may interact with your medicine. What should I watch for while using this medicine? Visit your doctor or health care professional for regular checks on your progress. You may get drowsy or dizzy. Do not drive, use machinery, or do anything that needs mental alertness until you know how this medicine affects you. Do not stand or sit up quickly, especially if you are an older patient. This reduces the risk of dizzy or fainting spells. Alcohol may interfere with the effect of this medicine. Avoid alcoholic drinks. This medicine can reduce the response of your body to heat or cold. Dress warm in cold weather and stay hydrated in hot weather. If possible, avoid extreme temperatures like saunas, hot tubs, very hot or cold showers, or activities that can cause dehydration such as vigorous exercise. This medicine can make you more sensitive to the sun. Keep out of the sun. If you cannot avoid being in the sun, wear  protective clothing and use sunscreen. Do not use sun lamps or tanning beds/booths. Your mouth may get dry. Chewing sugarless gum or sucking hard candy, and drinking plenty of water may help. Contact your doctor if the problem does not go away or is severe. What side effects may I notice from receiving this medicine? Side effects that you should report to your doctor or health care professional as soon as possible: -blurred vision -breast enlargement in men or women -breast milk in women who are not breast-feeding -chest pain, fast or irregular heartbeat -confusion, restlessness -dark yellow or brown urine -difficulty breathing or swallowing -dizziness or fainting spells -drooling, shaking, movement difficulty (shuffling walk) or rigidity -fever, chills, sore throat -involuntary or uncontrollable movements of the eyes, mouth, head, arms, and legs -seizures -stomach area pain -unusually weak or tired -unusual bleeding or bruising -yellowing of skin or eyes Side effects that usually do not require medical attention (report to your doctor or health care professional if they continue or are bothersome): -difficulty passing urine -difficulty sleeping -headache -sexual dysfunction -skin rash, or itching This list may not describe all possible side effects. Call your doctor for medical advice about side effects. You may report side effects to FDA at 1-800-FDA-1088. Where should I keep my medicine? Keep out of the reach of children. Store at room temperature between 15 and 30 degrees C (59 and 86 degrees F). Protect from light. Throw away any unused medicine after the expiration date. NOTE: This sheet is a summary. It may not cover all possible information. If you have questions about this medicine, talk to your doctor, pharmacist, or health care provider.  2015, [employer]. (2011-09-06 16:59:39) Lidocaine; Prilocaine cream What is this medicine? LIDOCAINE; PRILOCAINE (LYE doe  kane; PRIL oh kane) is a topical anesthetic that causes loss of feeling in the skin and surrounding tissues. It is used to numb the skin before procedures or injections. This medicine may be used for other purposes; ask your health care provider or pharmacist if you have questions. COMMON BRAND NAME(S): EMLA What should I tell my health care provider before I take this medicine? They need to know if you have any of these conditions: -glucose-6-phosphate deficiencies -heart disease -kidney or liver disease -methemoglobinemia -an unusual or allergic reaction to lidocaine, prilocaine, other medicines, foods, dyes, or preservatives -pregnant or trying to get pregnant -breast-feeding How should I use this medicine? This medicine is for external use only on the skin. Do not take by mouth. Follow the directions on the prescription label. Wash hands before and after use. Do not use more or leave in contact with the skin longer than directed. Do not apply to eyes or open wounds. It can cause irritation and blurred or temporary loss of vision. If this medicine comes in contact with your eyes, immediately rinse the eye with water. Do not touch or rub the eye. Contact your health care provider right away. Talk to your pediatrician regarding the use of this medicine in children. While this medicine may be prescribed for children for selected conditions, precautions do apply. Overdosage: If you think you have taken too much of this medicine contact a poison control center or emergency room at once. NOTE: This medicine is only for you. Do not share this medicine with others. What if I miss a dose? This medicine is usually only applied once prior to each procedure. It must be in contact with the skin for a period of time for it to work. If you applied this medicine later than directed, tell your health care professional before starting the procedure. What may interact with this  medicine? -acetaminophen -chloroquine -dapsone -medicines to control heart rhythm -nitrates like nitroglycerin and nitroprusside -other ointments, creams, or sprays that may contain anesthetic medicine -phenobarbital -phenytoin -quinine -sulfonamides like sulfacetamide, sulfamethoxazole, sulfasalazine and others This list may not describe all possible interactions. Give your health care provider a list of all the medicines, herbs, non-prescription drugs, or dietary supplements you use. Also tell them if you smoke, drink alcohol, or use illegal drugs. Some items may interact with your medicine. What should I watch for while using this medicine? Be careful to avoid injury to the treated area while it is numb and you are not aware of pain. Avoid scratching, rubbing, or exposing the treated area to hot or cold temperatures until complete sensation has returned. The numb feeling will wear off a few hours after applying the cream. What side effects may I notice from receiving this medicine? Side effects that you should report to your doctor or health care professional as soon as possible: -blurred vision -chest pain -difficulty breathing -dizziness -drowsiness -fast or irregular heartbeat -skin rash or itching -swelling of your throat, lips, or face -trembling Side effects that usually do not require medical attention (report to your doctor or health care professional if they continue or are bothersome): -changes in ability to feel hot or cold -redness and swelling at the application site This list may not describe all possible side effects. Call your doctor for medical advice about side effects. You may report side effects to FDA at 1-800-FDA-1088. Where should I keep my medicine? Keep out of reach of children. Store at room temperature between 15 and 30 degrees C (  59 and 86 degrees F). Keep container tightly closed. Throw away any unused medicine after the expiration date. NOTE: This  sheet is a summary. It may not cover all possible information. If you have questions about this medicine, talk to your doctor, pharmacist, or health care provider.  2015, [employer] Standard. (2007-10-22 17:14:35)

## 2014-11-03 ENCOUNTER — Encounter (HOSPITAL_COMMUNITY): Payer: Self-pay | Admitting: Hematology & Oncology

## 2014-11-04 ENCOUNTER — Encounter (HOSPITAL_COMMUNITY): Payer: Medicaid Other

## 2014-11-04 ENCOUNTER — Encounter (HOSPITAL_COMMUNITY): Payer: Self-pay | Admitting: *Deleted

## 2014-11-04 ENCOUNTER — Other Ambulatory Visit (HOSPITAL_COMMUNITY): Payer: Self-pay | Admitting: *Deleted

## 2014-11-04 DIAGNOSIS — C7800 Secondary malignant neoplasm of unspecified lung: Principal | ICD-10-CM

## 2014-11-04 DIAGNOSIS — C20 Malignant neoplasm of rectum: Secondary | ICD-10-CM

## 2014-11-04 MED ORDER — AMITRIPTYLINE HCL 50 MG PO TABS: 50.0000 mg | ORAL_TABLET | Freq: Every day | ORAL | Status: DC

## 2014-11-04 MED ORDER — MORPHINE SULFATE ER 15 MG PO TBCR
15.0000 mg | EXTENDED_RELEASE_TABLET | Freq: Once | ORAL | Status: AC
  Administered 2014-11-04: 15 mg via ORAL
  Filled 2014-11-04: qty 1

## 2014-11-04 MED ORDER — HYDROMORPHONE HCL 4 MG PO TABS
ORAL_TABLET | ORAL | Status: AC
  Filled 2014-11-04: qty 2

## 2014-11-04 MED ORDER — HYDROMORPHONE HCL 4 MG PO TABS
6.0000 mg | ORAL_TABLET | ORAL | Status: AC
  Administered 2014-11-04: 6 mg via ORAL

## 2014-11-04 MED ORDER — HYDROMORPHONE HCL 2 MG PO TABS: ORAL_TABLET | ORAL | Status: DC

## 2014-11-04 NOTE — Progress Notes (Unsigned)
Pt ran out of pain pills and is on way to XRT. No pain reassessment will be done since pt is leaving facility. Patient to get Dilaudid prescription refilled today as well as Elavil. Chemo teaching materials given to patient. Will review with patient later this afternoon when pt is done with XRT.

## 2014-11-05 ENCOUNTER — Encounter: Payer: Self-pay | Admitting: *Deleted

## 2014-11-05 ENCOUNTER — Encounter (HOSPITAL_COMMUNITY): Payer: Medicaid Other

## 2014-11-05 ENCOUNTER — Encounter (HOSPITAL_BASED_OUTPATIENT_CLINIC_OR_DEPARTMENT_OTHER): Payer: Medicaid Other

## 2014-11-05 ENCOUNTER — Encounter (HOSPITAL_COMMUNITY): Payer: Self-pay

## 2014-11-05 VITALS — BP 140/85 | HR 88 | Temp 98.6°F | Resp 20

## 2014-11-05 DIAGNOSIS — C7801 Secondary malignant neoplasm of right lung: Secondary | ICD-10-CM | POA: Diagnosis not present

## 2014-11-05 DIAGNOSIS — C20 Malignant neoplasm of rectum: Secondary | ICD-10-CM | POA: Diagnosis not present

## 2014-11-05 DIAGNOSIS — Z5111 Encounter for antineoplastic chemotherapy: Secondary | ICD-10-CM

## 2014-11-05 DIAGNOSIS — C7800 Secondary malignant neoplasm of unspecified lung: Principal | ICD-10-CM

## 2014-11-05 DIAGNOSIS — R918 Other nonspecific abnormal finding of lung field: Secondary | ICD-10-CM | POA: Diagnosis present

## 2014-11-05 LAB — CBC WITH DIFFERENTIAL/PLATELET
BASOS ABS: 0 10*3/uL (ref 0.0–0.1)
Basophils Relative: 0 % (ref 0–1)
EOS ABS: 0.4 10*3/uL (ref 0.0–0.7)
EOS PCT: 4 % (ref 0–5)
HCT: 44.6 % (ref 39.0–52.0)
Hemoglobin: 15.1 g/dL (ref 13.0–17.0)
Lymphocytes Relative: 20 % (ref 12–46)
Lymphs Abs: 1.8 10*3/uL (ref 0.7–4.0)
MCH: 30.9 pg (ref 26.0–34.0)
MCHC: 33.9 g/dL (ref 30.0–36.0)
MCV: 91.4 fL (ref 78.0–100.0)
Monocytes Absolute: 0.8 10*3/uL (ref 0.1–1.0)
Monocytes Relative: 9 % (ref 3–12)
NEUTROS ABS: 6 10*3/uL (ref 1.7–7.7)
NEUTROS PCT: 67 % (ref 43–77)
PLATELETS: 282 10*3/uL (ref 150–400)
RBC: 4.88 MIL/uL (ref 4.22–5.81)
RDW: 13 % (ref 11.5–15.5)
WBC: 9 10*3/uL (ref 4.0–10.5)

## 2014-11-05 LAB — COMPREHENSIVE METABOLIC PANEL
ALK PHOS: 91 U/L (ref 38–126)
ALT: 19 U/L (ref 17–63)
AST: 17 U/L (ref 15–41)
Albumin: 3.8 g/dL (ref 3.5–5.0)
Anion gap: 7 (ref 5–15)
BUN: 10 mg/dL (ref 6–20)
CALCIUM: 8.5 mg/dL — AB (ref 8.9–10.3)
CO2: 26 mmol/L (ref 22–32)
Chloride: 103 mmol/L (ref 101–111)
Creatinine, Ser: 0.76 mg/dL (ref 0.61–1.24)
GLUCOSE: 109 mg/dL — AB (ref 65–99)
Potassium: 3.7 mmol/L (ref 3.5–5.1)
Sodium: 136 mmol/L (ref 135–145)
Total Bilirubin: 0.5 mg/dL (ref 0.3–1.2)
Total Protein: 6.9 g/dL (ref 6.5–8.1)

## 2014-11-05 MED ORDER — DEXTROSE 5 % IV SOLN
Freq: Once | INTRAVENOUS | Status: AC
  Administered 2014-11-05: 10:00:00 via INTRAVENOUS

## 2014-11-05 MED ORDER — HYDROMORPHONE HCL 4 MG PO TABS
4.0000 mg | ORAL_TABLET | Freq: Once | ORAL | Status: AC
Start: 2014-11-05 — End: 2014-11-05
  Administered 2014-11-05: 4 mg via ORAL
  Filled 2014-11-05: qty 1

## 2014-11-05 MED ORDER — SODIUM CHLORIDE 0.9 % IJ SOLN: 10.0000 mL | INTRAMUSCULAR | Status: DC | PRN

## 2014-11-05 MED ORDER — DEXTROSE 5 % IV SOLN
400.0000 mg/m2 | Freq: Once | INTRAVENOUS | Status: AC
  Administered 2014-11-05: 836 mg via INTRAVENOUS
  Filled 2014-11-05: qty 41.8

## 2014-11-05 MED ORDER — HEPARIN SOD (PORK) LOCK FLUSH 100 UNIT/ML IV SOLN: 500.0000 [IU] | Freq: Once | INTRAVENOUS | Status: DC | PRN

## 2014-11-05 MED ORDER — FLUOROURACIL CHEMO INJECTION 2.5 GM/50ML
400.0000 mg/m2 | Freq: Once | INTRAVENOUS | Status: AC
  Administered 2014-11-05: 850 mg via INTRAVENOUS
  Filled 2014-11-05: qty 17

## 2014-11-05 MED ORDER — FLUOROURACIL CHEMO INJECTION 5 GM/100ML
2400.0000 mg/m2 | INTRAVENOUS | Status: DC
  Administered 2014-11-05: 5000 mg via INTRAVENOUS
  Filled 2014-11-05: qty 100

## 2014-11-05 MED ORDER — MORPHINE SULFATE ER 15 MG PO TBCR
15.0000 mg | EXTENDED_RELEASE_TABLET | Freq: Once | ORAL | Status: AC
  Administered 2014-11-05: 15 mg via ORAL
  Filled 2014-11-05: qty 1

## 2014-11-05 MED ORDER — OXALIPLATIN CHEMO INJECTION 100 MG/20ML
85.0000 mg/m2 | Freq: Once | INTRAVENOUS | Status: AC
  Administered 2014-11-05: 180 mg via INTRAVENOUS
  Filled 2014-11-05: qty 36

## 2014-11-05 MED ORDER — SODIUM CHLORIDE 0.9 % IV SOLN
Freq: Once | INTRAVENOUS | Status: AC
  Administered 2014-11-05: 10:00:00 via INTRAVENOUS
  Filled 2014-11-05: qty 4

## 2014-11-05 NOTE — Progress Notes (Signed)
Chemo teaching done and consent signed for Oxaliplatin, Leucovorin, 5FU, Avastin. Calendar given to patient.

## 2014-11-05 NOTE — Patient Instructions (Signed)
[facility] Discharge Instructions for Patients Receiving Chemotherapy  Today you received the following chemotherapy agents oxaliplatin, leucovorin, and 68f.    To help prevent nausea and vomiting after your treatment, we encourage you to take your nausea medication Zofran '8mg'$  every 8 hours as needed for nausea or vomiting Begin taking it as needed and take it as often as prescribed for the next 48 hours.   If you develop nausea and vomiting, or diarrhea that is not controlled by your medication, call the clinic.  The clinic phone number is [phone] Office hours are Monday-Friday 8:30am-5:00pm.  BELOW ARE SYMPTOMS THAT SHOULD BE REPORTED IMMEDIATELY:  *FEVER GREATER THAN 101.0 F  *CHILLS WITH OR WITHOUT FEVER  NAUSEA AND VOMITING THAT IS NOT CONTROLLED WITH YOUR NAUSEA MEDICATION  *UNUSUAL SHORTNESS OF BREATH  *UNUSUAL BRUISING OR BLEEDING  TENDERNESS IN MOUTH AND THROAT WITH OR WITHOUT PRESENCE OF ULCERS  *URINARY PROBLEMS  *BOWEL PROBLEMS  UNUSUAL RASH Items with * indicate a potential emergency and should be followed up as soon as possible. If you have an emergency after office hours please contact your primary care physician or go to the nearest emergency department.  Please call the clinic during office hours if you have any questions or concerns.   You may also contact the Patient Navigator at ([phone] you have any questions or need assistance in obtaining follow up care. _____________________________________________________________________ Have you asked about our STAR program?    STAR stands for Survivorship Training and Rehabilitation, and this is a nationally recognized cancer care program that focuses on survivorship and rehabilitation.  Cancer and cancer treatments may cause problems, such as, pain, making you feel tired and keeping you from doing the things that you need or want to do. Cancer rehabilitation can help. Our goal is  to reduce these troubling effects and help you have the best quality of life possible.  You may receive a survey from a nurse that asks questions about your current state of health.  Based on the survey results, all eligible patients will be referred to the [facility] for an evaluation so we can better serve you! A frequently asked questions sheet is available upon request.

## 2014-11-05 NOTE — Progress Notes (Signed)
[facility] Psychosocial Distress Screening Clinical Social Work  Clinical Social Work was referred by team as he is a new pt and starting treatment for Stage IV rectal cancer. CSW reviewed the distress screening protocol with pt during his chemo.  The patient scored a 10 on the Psychosocial Distress Thermometer which indicates severe distress. Clinical Social Worker met with pt at length to further assess needs and provide support. Pt reports to be trying to adjust to his illness, he currently feels appropriately "overwhelmed". CSW discussed common emotions and thoughts experienced with new cancer diagnosis. CSW also reviewed resource/support options located in the local area.   Pt has lived here for about one year and has not been working for about that same time due to a knee injury. He had been living off savings, but used that up to pay his rent in June. Pt reports his biggest financial concern is his rent currently. He states it is $450 a month. He completed application for assistance through [employer] for possible rent assistance. He is aware they probably cannot pay his whole rent. He currently receives $600 a month in food stamps and feels food insecurity is not a concern as a result. He reports he recently applied for ss disability and medicaid through hospital financial counselor, Caryl Pina. He reports to have a car for transportation and received [employer] card to help with gas. CSW reviewed other resources that can also assist with transportation if rides are needed. Pt is aware and educated on time delay for ACS and [facility]ATS.   Pt reports to live with his girlfriend, Laddie Aquas and her daughters; Lovena Le 14yo and Mendel Ryder 51yo visit often. His extended family is in [place] state and he has no family locally. Girlfriend was looking for work today and has job interview in the near future. She has many family in the area that are supportive. Pt aware of how to access additional resources in the community and agrees to  reach out as needed.   ONCBCN DISTRESS SCREENING 11/05/2014  Screening Type Initial Screening  Distress experienced in past week (1-10) 10  Practical problem type Housing;Insurance;Work/school;Food;Transportation  Emotional problem type Adjusting to illness  Information Concerns Type Lack of info about treatment  Physical Problem type Pain  Physician notified of physical symptoms Yes  Referral to clinical social work Yes    Clinical Social Worker follow up needed: Yes.    If yes, follow up plan: CSW will continue to follow and assist as needed.   Loren Racer, [place] Tuesdays 8:30-1pm Wednesdays 8:30-12pm  [phone]

## 2014-11-05 NOTE — Progress Notes (Signed)
Patient tolerated infusion well.  He was given all chemo education before treatment today and was able to recite to me how to manage symptoms that he may experience at home with his PRN medications.  Spill kit in the bag for him to take home.  Instructions on what to do for pump malfunction provided.

## 2014-11-06 LAB — CEA: CEA: 4 ng/mL (ref 0.0–4.7)

## 2014-11-07 ENCOUNTER — Encounter (HOSPITAL_BASED_OUTPATIENT_CLINIC_OR_DEPARTMENT_OTHER): Payer: Medicaid Other

## 2014-11-07 DIAGNOSIS — C20 Malignant neoplasm of rectum: Secondary | ICD-10-CM | POA: Diagnosis not present

## 2014-11-07 DIAGNOSIS — Z452 Encounter for adjustment and management of vascular access device: Secondary | ICD-10-CM | POA: Diagnosis not present

## 2014-11-07 DIAGNOSIS — C7801 Secondary malignant neoplasm of right lung: Secondary | ICD-10-CM

## 2014-11-07 DIAGNOSIS — C7800 Secondary malignant neoplasm of unspecified lung: Principal | ICD-10-CM

## 2014-11-07 MED ORDER — HEPARIN SOD (PORK) LOCK FLUSH 100 UNIT/ML IV SOLN
INTRAVENOUS | Status: AC
  Filled 2014-11-07: qty 5

## 2014-11-07 MED ORDER — HEPARIN SOD (PORK) LOCK FLUSH 100 UNIT/ML IV SOLN
500.0000 [IU] | Freq: Once | INTRAVENOUS | Status: AC | PRN
  Administered 2014-11-07: 500 [IU]

## 2014-11-07 MED ORDER — SODIUM CHLORIDE 0.9 % IJ SOLN
10.0000 mL | INTRAMUSCULAR | Status: DC | PRN
  Administered 2014-11-07: 10 mL
  Filled 2014-11-07: qty 10

## 2014-11-07 NOTE — Progress Notes (Signed)
Andrew Kline presented for Portacath access and flush. Proper placement of portacath confirmed by CXR. Portacath located right chest wall. Good blood return present. Portacath flushed with 32m NS and 500U/550mHeparin and needle removed intact. Procedure without incident. Patient tolerated procedure well.

## 2014-11-14 ENCOUNTER — Encounter (HOSPITAL_COMMUNITY): Payer: Self-pay | Admitting: Oncology

## 2014-11-14 ENCOUNTER — Ambulatory Visit (HOSPITAL_COMMUNITY): Payer: Self-pay | Admitting: Oncology

## 2014-11-14 ENCOUNTER — Encounter (HOSPITAL_BASED_OUTPATIENT_CLINIC_OR_DEPARTMENT_OTHER): Payer: Medicaid Other | Admitting: Oncology

## 2014-11-14 VITALS — BP 136/71 | HR 87 | Temp 98.4°F | Resp 20 | Wt 203.6 lb

## 2014-11-14 DIAGNOSIS — C7800 Secondary malignant neoplasm of unspecified lung: Secondary | ICD-10-CM

## 2014-11-14 DIAGNOSIS — C20 Malignant neoplasm of rectum: Secondary | ICD-10-CM | POA: Diagnosis present

## 2014-11-14 DIAGNOSIS — C7801 Secondary malignant neoplasm of right lung: Secondary | ICD-10-CM | POA: Diagnosis not present

## 2014-11-14 DIAGNOSIS — K6289 Other specified diseases of anus and rectum: Secondary | ICD-10-CM

## 2014-11-14 NOTE — Progress Notes (Signed)
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  INTERVAL HISTORY: Andrew Kline 49 y.o. male returns for followup of Stage IV CRC.    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    09/25/2014 Imaging CT abd/pelvis- Indeterminate nodules within the bilateral lung bases with dominant approximately 1.7 cm centrally necrotic nodule within the right lower lobe...   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   10/09/2014 Imaging CT Chest- Bilateral pulmonary nodules of varying size are most consistent with pulmonary metastasis. There are approximately 6 lesions in each lung.   10/17/2014 Pathology Results Rectum, biopsy - INVASIVE ADENOCARCINOMA   10/29/2014 Pathology Results Diagnosis Lung, needle/core biopsy(ies), right lower lobe - METASTATIC ADENOCARCINOMA   11/04/2014 -  Radiation Therapy    11/05/2014 -  Chemotherapy FOLFOX + Avastin     I personally reviewed and went over laboratory results with the patient.  The results are noted within this dictation.  He reports that he tolerated therapy reasonably well.  He did not some intermittent cold intolerance.  He noted 1 episode of an emesis.  He reports it occurred suddenly.  He did take a Zofran, but immediately vomited.  He reports that he felt much better following and no other episodes of emesis.  He reports that it occurred the same day as chemotherapy once he got home.    He reports his rectal pain remains.  He should hopefully start getting relief from this in the next few days- weeks.  Past Medical History  Diagnosis Date  . Rectal pain   . Arthritis   . Renal cell cancer    2012.   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     has No Known Allergies.  Current Outpatient Prescriptions on File Prior to Visit  Medication Sig Dispense Refill  . amitriptyline (ELAVIL) 50 MG tablet Take 1 tablet (50 mg total) by mouth at bedtime. 30 tablet 2  . docusate sodium (COLACE) 100 MG capsule Take 1 capsule (100 mg total) by mouth every 12 (twelve) hours. 60 capsule 0  . HYDROmorphone (DILAUDID) 2 MG tablet Take 3 tablets every 4 hours as needed for pain. 100 tablet 0  . lidocaine-hydrocortisone (ANAMANTLE) 3-1 % KIT Place 1 application rectally 2 (two) times daily. For two weeks, alternate with Rectiv. 28 each 1  . lidocaine-prilocaine (EMLA) cream Apply a quarter size amount to port site 1 hour prior to chemo. Do not rub in. Cover with plastic wrap. 30 g 3  . morphine (MS CONTIN) 15 MG 12 hr tablet Take 1 tablet (15 mg total) by mouth every 12 (twelve) hours. 60 tablet 0  . nitroGLYCERIN (NITROGLYN) 2 % ointment Apply 0.5 inches topically daily. Anal area    . ondansetron (ZOFRAN) 8 MG tablet Take 1 tablet (8 mg total) by mouth every 8 (eight) hours as needed for nausea or vomiting. 60 tablet 1  . prochlorperazine (COMPAZINE) 10 MG tablet Take 1 tablet (10 mg total) by mouth every 6 (six) hours as needed for nausea or  vomiting. 60 tablet 0  . Bevacizumab (AVASTIN IV) Inject into the vein every 14 (fourteen) days. To start November 19, 2014    . dextrose 5 % SOLN 1,000 mL with fluorouracil 5 GM/100ML SOLN Inject into the vein every 14 (fourteen) days. To start November 05, 2014. To infuse over 46 hours via pump.    [name] leucovorin 50 MG injection Inject into the vein every 14 (fourteen) days. To start November 05, 2014    . OXALIPLATIN IV Inject into the vein every 14 (fourteen) days. To start November 05, 2014     No current facility-administered medications on file prior to visit.    Past Surgical  History  Procedure Laterality Date  . Hypospadias correction    . Laparoscopic partial nephrectomy  2012    right side  . Knee surgery Left     arthroscopy  . Flexible sigmoidoscopy N/A 09/30/2014    Procedure: FLEXIBLE SIGMOIDOSCOPY;  Surgeon: Danie Binder, MD;  Location: [facility];  Service: Endoscopy;  Laterality: N/A;  . Esophageal biopsy N/A 09/30/2014    Procedure: BIOPSY;  Surgeon: Danie Binder, MD;  Location: [facility];  Service: Endoscopy;  Laterality: N/A;  Anal Canal  . Flexible sigmoidoscopy N/A 10/17/2014    Procedure: FLEXIBLE SIGMOIDOSCOPY;  Surgeon: Danie Binder, MD;  Location: [facility];  Service: Endoscopy;  Laterality: N/A;  1325    Denies any headaches, dizziness, double vision, fevers, chills, night sweats, diarrhea, chest pain, heart palpitations, shortness of breath, blood in stool, black tarry stool, urinary pain, urinary burning, urinary frequency, hematuria.   PHYSICAL EXAMINATION  ECOG PERFORMANCE STATUS: 1 - Symptomatic but completely ambulatory  Filed Vitals:   11/14/14 1009  BP: 136/71  Pulse: 87  Temp: 98.4 F (36.9 C)  Resp: 20    GENERAL:alert, no distress, well nourished, well developed, comfortable, cooperative, obese and smiling SKIN: skin color, texture, turgor are normal, no rashes or significant lesions HEAD: Normocephalic, No masses, lesions, tenderness or abnormalities EYES: normal, PERRLA, EOMI, Conjunctiva are pink and non-injected EARS: External ears normal OROPHARYNX:lips, buccal mucosa, and tongue normal and mucous membranes are moist  NECK: supple, trachea midline LYMPH:  no palpable lymphadenopathy BREAST:not examined LUNGS: clear to auscultation  HEART: regular rate & rhythm, no murmurs and no gallops ABDOMEN:abdomen soft, obese and normal bowel sounds BACK: Back symmetric, no curvature. EXTREMITIES:less then 2 second capillary refill, no joint deformities, effusion, or inflammation, no skin discoloration, no cyanosis    NEURO: alert & oriented x 3 with fluent speech, no focal motor/sensory deficits   LABORATORY DATA: CBC    Component Value Date/Time   WBC 9.0 11/05/2014 0940   RBC 4.88 11/05/2014 0940   HGB 15.1 11/05/2014 0940   HCT 44.6 11/05/2014 0940   PLT 282 11/05/2014 0940   MCV 91.4 11/05/2014 0940   MCH 30.9 11/05/2014 0940   MCHC 33.9 11/05/2014 0940   RDW 13.0 11/05/2014 0940   LYMPHSABS 1.8 11/05/2014 0940   MONOABS 0.8 11/05/2014 0940   EOSABS 0.4 11/05/2014 0940   BASOSABS 0.0 11/05/2014 0940      Chemistry      Component Value Date/Time   NA 136 11/05/2014 0940   K 3.7 11/05/2014 0940   CL 103 11/05/2014 0940   CO2 26 11/05/2014 0940   BUN 10 11/05/2014 0940   CREATININE 0.76 11/05/2014 0940      Component Value Date/Time   CALCIUM 8.5* 11/05/2014 0940   ALKPHOS 91 11/05/2014 0940  AST 17 11/05/2014 0940   ALT 19 11/05/2014 0940   BILITOT 0.5 11/05/2014 0940     Lab Results  Component Value Date   CEA 4.0 11/05/2014     PENDING LABS:   RADIOGRAPHIC STUDIES:  Dg Chest 1 View  10/29/2014   CLINICAL DATA:  Post right lung biopsy, placement of Port-A-Cath, history of rectal carcinoma with lung metastasis  EXAM: CHEST  1 VIEW  COMPARISON:  Limited CT chest scan for biopsy performed today  FINDINGS: No pneumothorax is seen post biopsy. No effusion is noted. A right-sided Port-A-Cath is present with the tip overlying the mid lower SVC. The heart is borderline enlarged.  IMPRESSION: 1. No pneumothorax. 2. Right-sided Port-A-Cath tip overlies the mid lower SVC.   Electronically Signed   By: Ivar Drape M.D.   On: 10/29/2014 13:54   Dg Chest 2 View  10/31/2014   CLINICAL DATA:  Right lower lobe nodule biopsy as well as Port-A-Cath appliance placement on Wednesday ; pleuritic right lower chest discomfort and intermittent hemoptysis since Wednesday.  EXAM: CHEST  2 VIEW  COMPARISON:  Portable chest x-ray of October 29, 2014 and chest CT scan of October 09, 2014  FINDINGS: The  lungs are adequately inflated. The interstitial markings are coarse in the mid and lower lung zones. Subtle patchy density projects over the posterior aspect of the right eighth rib that likely reflects the previously biopsied nodule. There is no pleural effusion or pneumothorax. The heart and pulmonary vascularity are normal. The power port appliance tip projects over the midportion of the SVC. The bony thorax is unremarkable.  IMPRESSION: Increased prominence of the mid and lower interstitial markings bilaterally may reflect subsegmental atelectasis. There is no pleural effusion or pneumothorax.  If the patient's hemoptysis or chest discomfort worsens, chest CT scanning would be useful.   Electronically Signed   By: David  Martinique M.D.   On: 10/31/2014 13:20   Ir Fluoro Guide Cv Line Right  10/29/2014   CLINICAL DATA:  Rectal carcinoma, needs venous access for chemotherapy.  EXAM: TUNNELED PORT CATHETER PLACEMENT WITH ULTRASOUND AND FLUOROSCOPIC GUIDANCE  FLUOROSCOPY TIME:  12 seconds, 6.5 mGy  ANESTHESIA/SEDATION: Intravenous Fentanyl and Versed were administered as conscious sedation during continuous cardiorespiratory monitoring by the radiology RN, with a total moderate sedation time of less than 30 minutes.  TECHNIQUE: The procedure, risks, benefits, and alternatives were explained to the patient. Questions regarding the procedure were encouraged and answered. The patient understands and consents to the procedure. As antibiotic prophylaxis, cefazolin 1 g was ordered pre-procedure and administered intravenously within one hour of incision. Patency of the right IJ vein was confirmed with ultrasound with image documentation. An appropriate skin site was determined. Skin site was marked. Region was prepped using maximum barrier technique including cap and mask, sterile gown, sterile gloves, large sterile sheet, and Chlorhexidine as cutaneous antisepsis. The region was infiltrated locally with 1% lidocaine.  Under real-time ultrasound guidance, the right IJ vein was accessed with a 21 gauge micropuncture needle; the needle tip within the vein was confirmed with ultrasound image documentation. Needle was exchanged over a 018 guidewire for transitional dilator which allowed passage of the [facility] wire into the IVC. Over this, the transitional dilator was exchanged for a 5 [place] MPA catheter. A small incision was made on the right anterior chest wall and a subcutaneous pocket fashioned. The power-injectable port was positioned and its catheter tunneled to the right IJ dermatotomy site. The MPA catheter was exchanged over  an Amplatz wire for a peel-away sheath, through which the port catheter, which had been trimmed to the appropriate length, was advanced and positioned under fluoroscopy with its tip at the cavoatrial junction. Spot chest radiograph confirms good catheter position and no pneumothorax. The pocket was closed with deep interrupted and subcuticular continuous 3-0 Monocryl sutures. The port was flushed per protocol. The incisions were covered with Dermabond then covered with a sterile dressing.  COMPLICATIONS: COMPLICATIONS None immediate  IMPRESSION: Technically successful right IJ power-injectable port catheter placement. Ready for routine use.   Electronically Signed   By: Lucrezia Europe M.D.   On: 10/29/2014 16:06   Ir US Guide Vasc Access Right  10/29/2014   CLINICAL DATA:  Rectal carcinoma, needs venous access for chemotherapy.  EXAM: TUNNELED PORT CATHETER PLACEMENT WITH ULTRASOUND AND FLUOROSCOPIC GUIDANCE  FLUOROSCOPY TIME:  12 seconds, 6.5 mGy  ANESTHESIA/SEDATION: Intravenous Fentanyl and Versed were administered as conscious sedation during continuous cardiorespiratory monitoring by the radiology RN, with a total moderate sedation time of less than 30 minutes.  TECHNIQUE: The procedure, risks, benefits, and alternatives were explained to the patient. Questions regarding the procedure were encouraged  and answered. The patient understands and consents to the procedure. As antibiotic prophylaxis, cefazolin 1 g was ordered pre-procedure and administered intravenously within one hour of incision. Patency of the right IJ vein was confirmed with ultrasound with image documentation. An appropriate skin site was determined. Skin site was marked. Region was prepped using maximum barrier technique including cap and mask, sterile gown, sterile gloves, large sterile sheet, and Chlorhexidine as cutaneous antisepsis. The region was infiltrated locally with 1% lidocaine. Under real-time ultrasound guidance, the right IJ vein was accessed with a 21 gauge micropuncture needle; the needle tip within the vein was confirmed with ultrasound image documentation. Needle was exchanged over a 018 guidewire for transitional dilator which allowed passage of the [facility] wire into the IVC. Over this, the transitional dilator was exchanged for a 5 [place] MPA catheter. A small incision was made on the right anterior chest wall and a subcutaneous pocket fashioned. The power-injectable port was positioned and its catheter tunneled to the right IJ dermatotomy site. The MPA catheter was exchanged over an Amplatz wire for a peel-away sheath, through which the port catheter, which had been trimmed to the appropriate length, was advanced and positioned under fluoroscopy with its tip at the cavoatrial junction. Spot chest radiograph confirms good catheter position and no pneumothorax. The pocket was closed with deep interrupted and subcuticular continuous 3-0 Monocryl sutures. The port was flushed per protocol. The incisions were covered with Dermabond then covered with a sterile dressing.  COMPLICATIONS: COMPLICATIONS None immediate  IMPRESSION: Technically successful right IJ power-injectable port catheter placement. Ready for routine use.   Electronically Signed   By: Lucrezia Europe M.D.   On: 10/29/2014 16:06   Ct Biopsy  10/29/2014   CLINICAL  DATA:  History of renal carcinoma post nephrectomy. Recent diagnosis of rectal carcinoma. Multiple pulmonary nodules.  EXAM: CT GUIDED CORE BIOPSY OF RIGHT LOWER LOBE LUNG NODULE  ANESTHESIA/SEDATION: Intravenous Fentanyl and Versed were administered as conscious sedation during continuous cardiorespiratory monitoring by the radiology RN, with a total moderate sedation time of 20 minutes.  PROCEDURE: The procedure risks, benefits, and alternatives were explained to the patient. Questions regarding the procedure were encouraged and answered. The patient understands and consents to the procedure.  Patient placed in right lateral decubitus position. Select axial scans through the thorax were obtained. The  target right lower lobe lesion was localized and an appropriate skin entry site determined and marked.  The operative field was prepped with Betadinein a sterile fashion, and a sterile drape was applied covering the operative field. A sterile gown and sterile gloves were used for the procedure. Local anesthesia was provided with 1% Lidocaine.  Under CT fluoroscopic guidance, a 17 gauge trocar needle was advanced to the margin of the lesion. Once needle tip position was confirmed, coaxial 18-gauge core biopsy samples were obtained, submitted in formalin to surgical pathology. The guide needle was removed. Postprocedure scans demonstrate regional alveolar hemorrhage. There is transient self-limited hemoptysis.  COMPLICATIONS: Mild hemoptysis, self-limited. SIR level A: No therapy, no consequence.  FINDINGS: Limited scanning again confirms Bilateral pulmonary nodules. The dominant cavitary posterior right lower lobe nodule was identified and percutaneous core biopsy samples obtained.  IMPRESSION: 1. Technically successful CT-guided core biopsy of right lower lobe lung nodule. Follow-up chest radiography and observation is scheduled.   Electronically Signed   By: Lucrezia Europe M.D.   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    S/P port placement on 6/29.  Planning underway with Rad Onc for palliative radiation therapy for anorectal pain control.  He is planned to start on Tuesday, 7/6.  I have reviewed Rad Onc note and referral to surgeon is noted.  Utility of surgery is unknown and not indicated.  We will start Avastin in the future given his recent port placement.  Oncology history updated.  Return next week for follow-up and to embark on cycle 2 of therapy.     THERAPY PLAN:  No role for surgery at this time.  I am not sure the utility of surgery referral by Rad Onc given metastatic disease to lung.  Continue with treatment as planned.  All questions were answered. The patient knows to call the clinic with any problems, questions or concerns. We can certainly see the patient much sooner if necessary.  Patient and plan discussed with Dr. Ancil Linsey and she is in agreement with the aforementioned.   This note is electronically signed by: Doy Mince 11/14/2014 10:48 AM

## 2014-11-14 NOTE — Assessment & Plan Note (Addendum)
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    S/P port placement on 6/29.  Planning underway with Rad Onc for palliative radiation therapy for anorectal pain control.  He is planned to start on Tuesday, 7/6.  I have reviewed Rad Onc note and referral to surgeon is noted.  Utility of surgery is unknown and not indicated.  We will start Avastin in the future given his recent port placement.  Oncology history updated.  Return next week for follow-up and to embark on cycle 2 of therapy.

## 2014-11-14 NOTE — Patient Instructions (Signed)
[place] at [facility] Discharge Instructions  RECOMMENDATIONS MADE BY THE CONSULTANT AND ANY TEST RESULTS WILL BE SENT TO YOUR REFERRING PHYSICIAN.  Exam and discussion by Robynn Pane, PA-C No changes at present Call with concerns or issues. Follow-up as scheduled.  Thank you for choosing [place] at [facility] to provide your oncology and hematology care.  To afford each patient quality time with our provider, please arrive at least 15 minutes before your scheduled appointment time.    You need to re-schedule your appointment should you arrive 10 or more minutes late.  We strive to give you quality time with our providers, and arriving late affects you and other patients whose appointments are after yours.  Also, if you no show three or more times for appointments you may be dismissed from the clinic at the providers discretion.     Again, thank you for choosing [facility].  Our hope is that these requests will decrease the amount of time that you wait before being seen by our physicians.       _____________________________________________________________  Should you have questions after your visit to [facility], please contact our office at [phone] between the hours of 8:30 a.m. and 4:30 p.m.  Voicemails left after 4:30 p.m. will not be returned until the following business day.  For prescription refill requests, have your pharmacy contact our office.

## 2014-11-19 ENCOUNTER — Encounter (HOSPITAL_BASED_OUTPATIENT_CLINIC_OR_DEPARTMENT_OTHER): Payer: Medicaid Other | Admitting: Hematology & Oncology

## 2014-11-19 ENCOUNTER — Encounter (HOSPITAL_COMMUNITY): Payer: Self-pay | Admitting: Hematology & Oncology

## 2014-11-19 ENCOUNTER — Encounter (HOSPITAL_BASED_OUTPATIENT_CLINIC_OR_DEPARTMENT_OTHER): Payer: Medicaid Other

## 2014-11-19 ENCOUNTER — Encounter: Payer: Self-pay | Admitting: *Deleted

## 2014-11-19 VITALS — BP 145/84 | HR 84 | Temp 98.2°F | Resp 22 | Wt 207.6 lb

## 2014-11-19 VITALS — BP 134/94 | HR 83 | Temp 98.1°F | Resp 22

## 2014-11-19 DIAGNOSIS — G893 Neoplasm related pain (acute) (chronic): Secondary | ICD-10-CM

## 2014-11-19 DIAGNOSIS — Z800 Family history of malignant neoplasm of digestive organs: Secondary | ICD-10-CM | POA: Diagnosis not present

## 2014-11-19 DIAGNOSIS — C20 Malignant neoplasm of rectum: Secondary | ICD-10-CM

## 2014-11-19 DIAGNOSIS — K6289 Other specified diseases of anus and rectum: Secondary | ICD-10-CM

## 2014-11-19 DIAGNOSIS — Z720 Tobacco use: Secondary | ICD-10-CM | POA: Diagnosis not present

## 2014-11-19 DIAGNOSIS — C7801 Secondary malignant neoplasm of right lung: Secondary | ICD-10-CM

## 2014-11-19 DIAGNOSIS — C7800 Secondary malignant neoplasm of unspecified lung: Principal | ICD-10-CM

## 2014-11-19 DIAGNOSIS — Z801 Family history of malignant neoplasm of trachea, bronchus and lung: Secondary | ICD-10-CM

## 2014-11-19 DIAGNOSIS — Z5111 Encounter for antineoplastic chemotherapy: Secondary | ICD-10-CM | POA: Diagnosis not present

## 2014-11-19 DIAGNOSIS — Z5112 Encounter for antineoplastic immunotherapy: Secondary | ICD-10-CM

## 2014-11-19 DIAGNOSIS — R918 Other nonspecific abnormal finding of lung field: Secondary | ICD-10-CM | POA: Diagnosis not present

## 2014-11-19 DIAGNOSIS — Z598 Other problems related to housing and economic circumstances: Secondary | ICD-10-CM

## 2014-11-19 DIAGNOSIS — Z599 Problem related to housing and economic circumstances, unspecified: Secondary | ICD-10-CM

## 2014-11-19 LAB — COMPREHENSIVE METABOLIC PANEL
ALT: 19 U/L (ref 17–63)
ANION GAP: 7 (ref 5–15)
AST: 16 U/L (ref 15–41)
Albumin: 3.7 g/dL (ref 3.5–5.0)
Alkaline Phosphatase: 101 U/L (ref 38–126)
BILIRUBIN TOTAL: 0.3 mg/dL (ref 0.3–1.2)
BUN: 9 mg/dL (ref 6–20)
CHLORIDE: 105 mmol/L (ref 101–111)
CO2: 27 mmol/L (ref 22–32)
Calcium: 8.7 mg/dL — ABNORMAL LOW (ref 8.9–10.3)
Creatinine, Ser: 0.79 mg/dL (ref 0.61–1.24)
GFR calc Af Amer: >60 mL/min (ref >60)
GFR calc non Af Amer: >60 mL/min (ref >60)
GLUCOSE: 158 mg/dL — AB (ref 65–99)
POTASSIUM: 3.6 mmol/L (ref 3.5–5.1)
Sodium: 139 mmol/L (ref 135–145)
Total Protein: 6.6 g/dL (ref 6.5–8.1)

## 2014-11-19 LAB — CBC WITH DIFFERENTIAL/PLATELET
Basophils Absolute: 0 10*3/uL (ref 0.0–0.1)
Basophils Relative: 0 % (ref 0–1)
Eosinophils Absolute: 0.3 10*3/uL (ref 0.0–0.7)
Eosinophils Relative: 5 % (ref 0–5)
HCT: 42 % (ref 39.0–52.0)
Hemoglobin: 14.2 g/dL (ref 13.0–17.0)
LYMPHS PCT: 15 % (ref 12–46)
Lymphs Abs: 0.8 10*3/uL (ref 0.7–4.0)
MCH: 30.5 pg (ref 26.0–34.0)
MCHC: 33.8 g/dL (ref 30.0–36.0)
MCV: 90.1 fL (ref 78.0–100.0)
MONO ABS: 0.7 10*3/uL (ref 0.1–1.0)
Monocytes Relative: 13 % — ABNORMAL HIGH (ref 3–12)
NEUTROS ABS: 3.6 10*3/uL (ref 1.7–7.7)
NEUTROS PCT: 67 % (ref 43–77)
Platelets: 169 10*3/uL (ref 150–400)
RBC: 4.66 MIL/uL (ref 4.22–5.81)
RDW: 13.1 % (ref 11.5–15.5)
WBC: 5.4 10*3/uL (ref 4.0–10.5)

## 2014-11-19 LAB — URINALYSIS, DIPSTICK ONLY
Bilirubin Urine: NEGATIVE
GLUCOSE, UA: NEGATIVE mg/dL
HGB URINE DIPSTICK: NEGATIVE
KETONES UR: NEGATIVE mg/dL
Leukocytes, UA: NEGATIVE
NITRITE: NEGATIVE
Protein, ur: NEGATIVE mg/dL
Specific Gravity, Urine: 1.015 (ref 1.005–1.030)
UROBILINOGEN UA: 0.2 mg/dL (ref 0.0–1.0)
pH: 6 (ref 5.0–8.0)

## 2014-11-19 MED ORDER — MORPHINE SULFATE ER 15 MG PO TBCR
15.0000 mg | EXTENDED_RELEASE_TABLET | Freq: Two times a day (BID) | ORAL | Status: AC
  Administered 2014-11-19: 15 mg via ORAL
  Filled 2014-11-19: qty 1

## 2014-11-19 MED ORDER — HYDROMORPHONE HCL 1 MG/ML IJ SOLN
4.0000 mg | INTRAMUSCULAR | Status: DC | PRN
  Administered 2014-11-19: 4 mg via INTRAVENOUS

## 2014-11-19 MED ORDER — FLUOROURACIL CHEMO INJECTION 5 GM/100ML
2400.0000 mg/m2 | INTRAVENOUS | Status: DC
  Administered 2014-11-19: 5000 mg via INTRAVENOUS
  Filled 2014-11-19: qty 100

## 2014-11-19 MED ORDER — SODIUM CHLORIDE 0.9 % IV SOLN
5.0000 mg/kg | Freq: Once | INTRAVENOUS | Status: AC
  Administered 2014-11-19: 475 mg via INTRAVENOUS
  Filled 2014-11-19: qty 16

## 2014-11-19 MED ORDER — MORPHINE SULFATE ER 30 MG PO TBCR: 30.0000 mg | EXTENDED_RELEASE_TABLET | Freq: Two times a day (BID) | ORAL | Status: DC

## 2014-11-19 MED ORDER — DEXTROSE 5 % IV SOLN
Freq: Once | INTRAVENOUS | Status: AC
  Administered 2014-11-19: 10:00:00 via INTRAVENOUS

## 2014-11-19 MED ORDER — FLUOROURACIL CHEMO INJECTION 2.5 GM/50ML
400.0000 mg/m2 | Freq: Once | INTRAVENOUS | Status: AC
  Administered 2014-11-19: 850 mg via INTRAVENOUS
  Filled 2014-11-19: qty 17

## 2014-11-19 MED ORDER — SODIUM CHLORIDE 0.9 % IJ SOLN
10.0000 mL | INTRAMUSCULAR | Status: DC | PRN
  Administered 2014-11-19: 10 mL
  Filled 2014-11-19: qty 10

## 2014-11-19 MED ORDER — HYDROMORPHONE HCL 1 MG/ML IJ SOLN
INTRAMUSCULAR | Status: AC
  Filled 2014-11-19: qty 4

## 2014-11-19 MED ORDER — LEUCOVORIN CALCIUM INJECTION 350 MG
400.0000 mg/m2 | Freq: Once | INTRAMUSCULAR | Status: AC
  Administered 2014-11-19: 836 mg via INTRAVENOUS
  Filled 2014-11-19: qty 41.8

## 2014-11-19 MED ORDER — OXALIPLATIN CHEMO INJECTION 100 MG/20ML
85.0000 mg/m2 | Freq: Once | INTRAVENOUS | Status: AC
  Administered 2014-11-19: 180 mg via INTRAVENOUS
  Filled 2014-11-19: qty 36

## 2014-11-19 MED ORDER — SODIUM CHLORIDE 0.9 % IV SOLN
Freq: Once | INTRAVENOUS | Status: AC
  Administered 2014-11-19: 10:00:00 via INTRAVENOUS
  Filled 2014-11-19: qty 4

## 2014-11-19 NOTE — Progress Notes (Signed)
[place] Clinical Social Work  Clinical Social Work was referred by [place] rounding for assessment of psychosocial needs due to previous financial concerns.  Clinical Social Worker met with patient at [facility] to offer support and assess for needs. Pt reports to have continued financial concerns and has been missing calls, but getting messages from [place], hospital financial counselor. CSW called her today on behalf of pt and left vm. She was out of the office and CSW left message. Pt stated no one had contacted him from [facility] to date, referral was to be sent in last week by financial counselor. CSW phoned [facility] and they shared they had no record of referral. CSW and pt completed a new referral today and CSW sent it to them for consideration to assist with utility bills and possibly medications. These are pt's biggest needs currently. He has also contacted [employer] for help with [employer]ch[employer], retail. Pt reports his GF has started at her new job and they are hopeful that their financial picture will somewhat improve with some income. Pt appreciated assistance and agrees to follow up with needed referrals for additional assistance. Pt agrees to reach out to CSW as needed.   Clinical Social Work interventions: Supportive Psychiatric nurse education Referral for fin assistance Loren Racer, [place] Tuesdays 8:30-1pm Wednesdays 8:30-12pm  [phone]

## 2014-11-19 NOTE — Patient Instructions (Addendum)
[place] at [facility] Discharge Instructions  RECOMMENDATIONS MADE BY THE CONSULTANT AND ANY TEST RESULTS WILL BE SENT TO YOUR REFERRING PHYSICIAN.  Exam completed by Dr [name] today Chemotherapy as scheduled today Return in 2 weeks to see the doctor and have chemotherapy. Please call the clinic if you have any questions or concerns   Thank you for choosing [place] at [facility] to provide your oncology and hematology care.  To afford each patient quality time with our provider, please arrive at least 15 minutes before your scheduled appointment time.    You need to re-schedule your appointment should you arrive 10 or more minutes late.  We strive to give you quality time with our providers, and arriving late affects you and other patients whose appointments are after yours.  Also, if you no show three or more times for appointments you may be dismissed from the clinic at the providers discretion.     Again, thank you for choosing [facility].  Our hope is that these requests will decrease the amount of time that you wait before being seen by our physicians.       _____________________________________________________________  Should you have questions after your visit to [facility], please contact our office at [phone] between the hours of 8:30 a.m. and 4:30 p.m.  Voicemails left after 4:30 p.m. will not be returned until the following business day.  For prescription refill requests, have your pharmacy contact our office.   ]

## 2014-11-19 NOTE — Progress Notes (Signed)
Ok, great!  Since the patient is uninsured and he has financial constraints (ie his water was recently turned off and we have paid his bill to keep his utilities going at his house), I will get the patient involved with our local surgeon, Dr. Arnoldo Morale.  Thank you!  Fabian Coca, PA-C

## 2014-11-19 NOTE — Patient Instructions (Signed)
[facility] Discharge Instructions for Patients Receiving Chemotherapy  Today you received the following chemotherapy agents Avastin, Oxaliplatin, Leucovorin and 5FU pump.  To help prevent nausea and vomiting after your treatment, we encourage you to take your nausea medication as instructed. If you develop nausea and vomiting that is not controlled by your nausea medication, call the clinic. If it is after clinic hours your family physician or the after hours number for the clinic or go to the Emergency Department. BELOW ARE SYMPTOMS THAT SHOULD BE REPORTED IMMEDIATELY:  *FEVER GREATER THAN 101.0 F  *CHILLS WITH OR WITHOUT FEVER  NAUSEA AND VOMITING THAT IS NOT CONTROLLED WITH YOUR NAUSEA MEDICATION  *UNUSUAL SHORTNESS OF BREATH  *UNUSUAL BRUISING OR BLEEDING  TENDERNESS IN MOUTH AND THROAT WITH OR WITHOUT PRESENCE OF ULCERS  *URINARY PROBLEMS  *BOWEL PROBLEMS  UNUSUAL RASH Items with * indicate a potential emergency and should be followed up as soon as possible.  Return at apx 1130 am Friday to have pump discontinued.  I have been informed and understand all the instructions given to me. I know to contact the clinic, my physician, or go to the Emergency Department if any problems should occur. I do not have any questions at this time, but understand that I may call the clinic during office hours or the Patient Navigator at [phone] should I have any questions or need assistance in obtaining follow up care.    __________________________________________  _____________  __________ Signature of Patient or Authorized Representative            Date                   Time    __________________________________________ Nurse's Signature

## 2014-11-19 NOTE — Progress Notes (Signed)
Tolerated chemo well. Ambulatory on discharge with family at side, continuous infusion pump intact.

## 2014-11-19 NOTE — Progress Notes (Signed)
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    09/25/2014 Imaging CT abd/pelvis- Indeterminate nodules within the bilateral lung bases with dominant approximately 1.7 cm centrally necrotic nodule within the right lower lobe...   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   10/09/2014 Imaging CT Chest- Bilateral pulmonary nodules of varying size are most consistent with pulmonary metastasis. There are approximately 6 lesions in each lung.   10/17/2014 Pathology Results Rectum, biopsy - INVASIVE ADENOCARCINOMA   10/29/2014 Pathology Results Diagnosis Lung, needle/core biopsy(ies), right lower lobe - METASTATIC ADENOCARCINOMA   11/04/2014 -  Radiation Therapy    11/05/2014 -  Chemotherapy FOLFOX + Avastin     HISTORY OF PRESENTING ILLNESS:  Andrew Kline 49 y.o. male is here because of stage IV rectal cancer.  He is here alone today. He goes by Andrew Kline. He is doing a little better in regards to pain. He still requires significant allotted for breakthrough. He is on long-acting morphine twice daily. Financial issues have been a great source of stress for the patient. His fiance has obtained a job that it is only part time. They have 1 carb between them.  Chemotherapy caused him to throw up once, which he attributed to coming in from the heat to the cold  A/C in his house. He also attributes the vomiting to not taking his nausea medication soon enough. This was his only instance of vomiting.  Anytime he feels nauseous he takes the nausea medication. He does not take the nausea medication regularly, just when he feels it necessary. He explains that he has "a strong stomach" and does not feel the need to take the nausea medication regularly. Within 3-5 days of chemotherapy treatment he is unable to drink cold beverages and he experienced tingling as well.   His stool is mostly soft and sometimes watery, though he believes it is due to his diet. He is cautious about his diet, saying that the smallest of hard stool leaves him screaming in pain. He currently takes Dulcolax every 12 hours to soften his stool.   His water was recently shut off.  He has spoken with a Education officer, museum here by the name "Andrew Kline or Andrew Kline" in regards to [employer] benefits, disability, or Medicaid.  He also met Andrew Kline.  MEDICAL HISTORY:  Past Medical History  Diagnosis Date  . Rectal pain   . Arthritis   . Renal cell cancer     2012.   [name] Rectal cancer     SURGICAL HISTORY: Past Surgical History  Procedure Laterality Date  . Hypospadias correction    . Laparoscopic partial nephrectomy  2012    right side  . Knee surgery Left     arthroscopy  . Flexible sigmoidoscopy N/A 09/30/2014    Procedure: FLEXIBLE SIGMOIDOSCOPY;  Surgeon: Andrew Binder, MD;  Location: [facility];  Service: Endoscopy;  Laterality: N/A;  . Esophageal biopsy N/A 09/30/2014    Procedure: BIOPSY;  Surgeon: Andrew Binder, MD;  Location: [facility];  Service: Endoscopy;  Laterality: N/A;  Anal Canal  . Flexible sigmoidoscopy N/A 10/17/2014    Procedure: FLEXIBLE SIGMOIDOSCOPY;  Surgeon: Andrew Binder, MD;  Location: [facility] SU[facility];  Service: Endoscopy;  Laterality: N/A;  1325    SOCIAL HISTORY: History   Social History  . Marital Status: Divorced    Spouse Name: N/A  . Number of Children: 2  . Years of Education: N/A   Occupational History  . retail    Social History Main Topics  . Smoking status: Current Every Day Smoker --  1.00 packs/day for 30 years    Types: Cigarettes  . Smokeless tobacco: Not on file     Comment: a little over a pack daily  . Alcohol Use: 0.0 oz/week    0 Standard drinks or equivalent per week     Comment: Occ  . Drug Use: No  . Sexual Activity: Yes    Birth Control/ Protection: None   Other Topics Concern  . Not on file   Social History Narrative  He is from [place], [place]. Moved here in March. Smoker, 1 ppd. Was 1.5 ppd. ETOH, none. 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 He is divorced. Lives with fiance. 2 children. 46 and 64 yo. No grandchildren.  FAMILY HISTORY: Family History  Problem Relation Age of Onset  . Lung cancer Maternal Grandfather   . Colon cancer Maternal Uncle     age 93  . Diabetes Other   . Healthy Mother    has no family status information on file.   Mother living, 9 yo. Still working as a Audiological scientist. Father living, 46-70 yo. 1 brother, 1 sister, 1 step sister. Maternal uncle has colon cancer. He is married to a GI nurse. So he has people to go to with questions and is informed on what is happening. History of diabetes in the family. No history of heart disease in the family.  ALLERGIES:  has No Known Allergies.  MEDICATIONS:  Current Outpatient Prescriptions  Medication Sig Dispense Refill  . amitriptyline (ELAVIL) 50 MG tablet Take 1 tablet (50 mg total) by mouth at bedtime. 30 tablet 2  . Bevacizumab (AVASTIN IV) Inject into the vein every 14 (fourteen) days. To start November 19, 2014    . dextrose 5 % SOLN 1,000 mL with fluorouracil 5 GM/100ML SOLN Inject into the vein every 14 (fourteen) days. To start November 05, 2014. To infuse over 46 hours via pump.    [name] docusate sodium (COLACE) 100 MG capsule Take 1 capsule (100 mg total) by mouth every 12 (twelve) hours. 60 capsule 0  . HYDROmorphone (DILAUDID) 2 MG tablet Take 3 tablets every 4 hours as  needed for pain. 100 tablet 0  . leucovorin 50 MG injection Inject into the vein every 14 (fourteen) days. To start November 05, 2014    . lidocaine-hydrocortisone (ANAMANTLE) 3-1 % KIT Place 1 application rectally 2 (two) times daily. For two weeks, alternate with Rectiv. 28 each 1  . lidocaine-prilocaine (EMLA) cream Apply a quarter size amount to port site 1 hour prior to chemo. Do not rub in. Cover with plastic wrap. 30 g 3  . nitroGLYCERIN (NITROGLYN) 2 % ointment Apply 0.5 inches topically daily. Anal area    . ondansetron (ZOFRAN) 8 MG tablet Take 1 tablet (8 mg total) by mouth every 8 (eight) hours as needed for nausea or vomiting. 60 tablet 1  .  OXALIPLATIN IV Inject into the vein every 14 (fourteen) days. To start November 05, 2014    . prochlorperazine (COMPAZINE) 10 MG tablet Take 1 tablet (10 mg total) by mouth every 6 (six) hours as needed for nausea or vomiting. 60 tablet 0  . morphine (MS CONTIN) 30 MG 12 hr tablet Take 1 tablet (30 mg total) by mouth every 12 (twelve) hours. 60 tablet 0   Current Facility-Administered Medications  Medication Dose Route Frequency Provider Last Rate Last Dose  . HYDROmorphone (DILAUDID) injection 4 mg  4 mg Intravenous Q2H PRN Patrici Ranks, MD   4 mg at 11/19/14 1028    Review of Systems  Constitutional: Positive for malaise/fatigue and diaphoresis.  Respiratory: Negative for sputum production.   Gastrointestinal: Positive for blood in stool.  Genitourinary: Positive for rectum pain. Psychiatric/Behavioral: The patient is not nervous/anxious.    14 point ROS was done and is otherwise as detailed above or in HPI  PHYSICAL EXAMINATION:  ECOG PERFORMANCE STATUS: 1 - Symptomatic but completely ambulatory  Filed Vitals:   11/19/14 0900  BP: 145/84  Pulse: 84  Temp: 98.2 F (36.8 C)  Resp: 22   Filed Weights   11/19/14 0900  Weight: 207 lb 9.6 oz (94.167 kg)     Physical Exam  Constitutional: He is oriented to person, place, and time  and well-developed, well-nourished, and in no distress.   sitting upright in the chemotherapy infusion bed. He is working on a computer HENT:  Head: Normocephalic and atraumatic.  Nose: Nose normal.  Mouth/Throat: Oropharynx is clear and moist. No oropharyngeal exudate.  Dentures on top.  Eyes: Conjunctivae and EOM are normal. Pupils are equal, round, and reactive to light. Right eye exhibits no discharge. Left eye exhibits no discharge. No scleral icterus.  Neck: Normal range of motion. Neck supple. No tracheal deviation present. No thyromegaly present.  Cardiovascular: Normal rate, regular rhythm and normal heart sounds.  Exam reveals no gallop and no friction rub.   No murmur heard. Pulmonary/Chest: Effort normal and breath sounds normal. He has no wheezes. He has no rales.  Abdominal: Soft. Bowel sounds are normal. He exhibits no distension and no mass. There is no tenderness. There is no rebound and no guarding.  Genitourinary:   Musculoskeletal: Normal range of motion. He exhibits no edema.  Lymphadenopathy:    He has no cervical adenopathy.  Neurological: He is alert and oriented to person, place, and time. He has normal reflexes. No cranial nerve deficit. Gait normal. Coordination normal.  Skin: Skin is warm and dry. No rash noted.  Psychiatric: Mood, memory, affect and judgment normal.  Nursing note and vitals reviewed.   LABORATORY DATA:  I have reviewed the data as listed Lab Results  Component Value Date   WBC 5.4 11/19/2014   HGB 14.2 11/19/2014   HCT 42.0 11/19/2014   MCV 90.1 11/19/2014   PLT 169 11/19/2014   CMP     Component Value Date/Time   NA 139 11/19/2014 0906   K 3.6 11/19/2014 0906   CL 105 11/19/2014 0906   CO2 27 11/19/2014 0906   GLUCOSE 158* 11/19/2014 0906   BUN 9 11/19/2014 0906   CREATININE 0.79 11/19/2014 0906   CALCIUM 8.7* 11/19/2014 0906   PROT 6.6 11/19/2014 0906   ALBUMIN 3.7 11/19/2014 0906   AST 16 11/19/2014 0906   ALT 19  11/19/2014 0906   ALKPHOS 101 11/19/2014 0906   BILITOT 0.3 11/19/2014 0906   GFRNONAA >60 11/19/2014  6948   GFRAA >60 11/19/2014 0906    ASSESSMENT & PLAN:  STAGE IV Rectal vs. Anal carcinoma, adenocarcinoma Pulmonary metastases Rectal pain interfering with function Good PS History of partial nephrectomy 2012 at [facility] in [place] Uninsured FOLFOX/AVASTIN XRT for palliation   He did relatively well with his first cycle of chemotherapy. I again cautioned him in regards to the side effects of oxaliplatin, specifically the cold induced neuropathy. We again reviewed nausea and control of nausea. We discussed watching for problems with vomiting as I advised him that this is not a "acceptable" ongoing side effect.  We will continue with ongoing therapy. He will continue with palliative radiation.  Dr.Moody wants him referred to surgery, the patient cannot afford [facility] in [place] because he is uninsured and expressed that again today.  He also has significant transportation issues. We will continue to work with social work in regards to all of his financial struggles. We can refer him locally to Dr. Arnoldo Morale. Currently however, I advised the patient he does not need surgical intervention.  Going to increase his long-acting morphine to 30 mg twice daily. I again cautioned him on an adequate bowel regimen, encouraging him to add MiraLAX once or twice daily or milk of magnesia to help keep his stools soft. He will continue with Dilaudid for breakthrough.  All questions were answered. The patient knows to call the clinic with any problems, questions or concerns.  This note was electronically signed.    This document serves as a record of services personally performed by Ancil Linsey, MD. It was created on her behalf by Arlyce Harman, a trained medical scribe. The creation of this record is based on the scribe's personal observations and the provider's statements to them. This  document has been checked and approved by the attending provider.  I have reviewed the above documentation for accuracy and completeness, and I agree with the above.  [name], MD

## 2014-11-20 ENCOUNTER — Other Ambulatory Visit (HOSPITAL_COMMUNITY): Payer: Self-pay | Admitting: *Deleted

## 2014-11-20 DIAGNOSIS — C20 Malignant neoplasm of rectum: Secondary | ICD-10-CM

## 2014-11-20 DIAGNOSIS — C7800 Secondary malignant neoplasm of unspecified lung: Principal | ICD-10-CM

## 2014-11-20 MED ORDER — MORPHINE SULFATE ER 30 MG PO TBCR: 30.0000 mg | EXTENDED_RELEASE_TABLET | Freq: Two times a day (BID) | ORAL | Status: DC

## 2014-11-21 ENCOUNTER — Encounter (HOSPITAL_BASED_OUTPATIENT_CLINIC_OR_DEPARTMENT_OTHER): Payer: Medicaid Other

## 2014-11-21 VITALS — BP 136/91 | HR 80 | Temp 98.0°F | Resp 18

## 2014-11-21 DIAGNOSIS — C7801 Secondary malignant neoplasm of right lung: Secondary | ICD-10-CM | POA: Diagnosis not present

## 2014-11-21 DIAGNOSIS — Z452 Encounter for adjustment and management of vascular access device: Secondary | ICD-10-CM

## 2014-11-21 DIAGNOSIS — C7800 Secondary malignant neoplasm of unspecified lung: Principal | ICD-10-CM

## 2014-11-21 DIAGNOSIS — C20 Malignant neoplasm of rectum: Secondary | ICD-10-CM | POA: Diagnosis not present

## 2014-11-21 MED ORDER — SODIUM CHLORIDE 0.9 % IJ SOLN
10.0000 mL | INTRAMUSCULAR | Status: DC | PRN
  Administered 2014-11-21: 10 mL
  Filled 2014-11-21: qty 10

## 2014-11-21 MED ORDER — HEPARIN SOD (PORK) LOCK FLUSH 100 UNIT/ML IV SOLN
500.0000 [IU] | Freq: Once | INTRAVENOUS | Status: AC | PRN
  Administered 2014-11-21: 500 [IU]
  Filled 2014-11-21: qty 5

## 2014-11-21 NOTE — Progress Notes (Signed)
..  Andrew Kline returns today for port de access and flush after 46 hr continous infusion of 5f. Tolerated infusion without problems Portacath located rt chest wall was deaccessed and  flushed with 251mNS and 500U/53m52meparin and needle removed intact. Dressing was very damp, patient states it is from sweating and he knows not to get dressing wet. Procedure without incident. Patient tolerated procedure well.

## 2014-12-03 ENCOUNTER — Encounter (HOSPITAL_BASED_OUTPATIENT_CLINIC_OR_DEPARTMENT_OTHER): Payer: Medicaid Other | Admitting: Hematology & Oncology

## 2014-12-03 ENCOUNTER — Encounter (HOSPITAL_COMMUNITY): Payer: Self-pay | Admitting: Hematology & Oncology

## 2014-12-03 ENCOUNTER — Encounter (HOSPITAL_COMMUNITY): Payer: Medicaid Other | Attending: Hematology & Oncology

## 2014-12-03 VITALS — BP 142/88 | HR 84 | Temp 98.2°F | Resp 20 | Wt 201.0 lb

## 2014-12-03 VITALS — BP 162/83 | HR 87 | Temp 98.0°F | Resp 20

## 2014-12-03 DIAGNOSIS — R918 Other nonspecific abnormal finding of lung field: Secondary | ICD-10-CM | POA: Diagnosis not present

## 2014-12-03 DIAGNOSIS — C20 Malignant neoplasm of rectum: Secondary | ICD-10-CM

## 2014-12-03 DIAGNOSIS — Z5111 Encounter for antineoplastic chemotherapy: Secondary | ICD-10-CM | POA: Diagnosis not present

## 2014-12-03 DIAGNOSIS — Z5112 Encounter for antineoplastic immunotherapy: Secondary | ICD-10-CM | POA: Diagnosis present

## 2014-12-03 DIAGNOSIS — G893 Neoplasm related pain (acute) (chronic): Secondary | ICD-10-CM | POA: Diagnosis not present

## 2014-12-03 DIAGNOSIS — C7800 Secondary malignant neoplasm of unspecified lung: Secondary | ICD-10-CM

## 2014-12-03 LAB — CBC WITH DIFFERENTIAL/PLATELET
BASOS ABS: 0 10*3/uL (ref 0.0–0.1)
BASOS PCT: 0 % (ref 0–1)
EOS ABS: 0.3 10*3/uL (ref 0.0–0.7)
Eosinophils Relative: 5 % (ref 0–5)
HEMATOCRIT: 42 % (ref 39.0–52.0)
HEMOGLOBIN: 14.6 g/dL (ref 13.0–17.0)
Lymphocytes Relative: 13 % (ref 12–46)
Lymphs Abs: 0.7 10*3/uL (ref 0.7–4.0)
MCH: 31.3 pg (ref 26.0–34.0)
MCHC: 34.8 g/dL (ref 30.0–36.0)
MCV: 90.1 fL (ref 78.0–100.0)
MONOS PCT: 14 % — AB (ref 3–12)
Monocytes Absolute: 0.8 10*3/uL (ref 0.1–1.0)
Neutro Abs: 3.6 10*3/uL (ref 1.7–7.7)
Neutrophils Relative %: 68 % (ref 43–77)
PLATELETS: 197 10*3/uL (ref 150–400)
RBC: 4.66 MIL/uL (ref 4.22–5.81)
RDW: 14 % (ref 11.5–15.5)
WBC: 5.3 10*3/uL (ref 4.0–10.5)

## 2014-12-03 LAB — URINALYSIS, DIPSTICK ONLY
Bilirubin Urine: NEGATIVE
GLUCOSE, UA: NEGATIVE mg/dL
Ketones, ur: NEGATIVE mg/dL
Leukocytes, UA: NEGATIVE
NITRITE: NEGATIVE
PH: 6.5 (ref 5.0–8.0)
Protein, ur: NEGATIVE mg/dL
Specific Gravity, Urine: 1.015 (ref 1.005–1.030)
Urobilinogen, UA: 0.2 mg/dL (ref 0.0–1.0)

## 2014-12-03 LAB — COMPREHENSIVE METABOLIC PANEL
ALT: 15 U/L — ABNORMAL LOW (ref 17–63)
AST: 17 U/L (ref 15–41)
Albumin: 3.5 g/dL (ref 3.5–5.0)
Alkaline Phosphatase: 81 U/L (ref 38–126)
Anion gap: 9 (ref 5–15)
BILIRUBIN TOTAL: 0.4 mg/dL (ref 0.3–1.2)
BUN: 6 mg/dL (ref 6–20)
CALCIUM: 8.3 mg/dL — AB (ref 8.9–10.3)
CHLORIDE: 103 mmol/L (ref 101–111)
CO2: 26 mmol/L (ref 22–32)
CREATININE: 0.73 mg/dL (ref 0.61–1.24)
GFR calc non Af Amer: >60 mL/min (ref >60)
Glucose, Bld: 122 mg/dL — ABNORMAL HIGH (ref 65–99)
Potassium: 3.5 mmol/L (ref 3.5–5.1)
SODIUM: 138 mmol/L (ref 135–145)
TOTAL PROTEIN: 6.4 g/dL — AB (ref 6.5–8.1)

## 2014-12-03 MED ORDER — DEXTROSE 5 % IV SOLN
Freq: Once | INTRAVENOUS | Status: AC
Start: 2014-12-03 — End: 2014-12-03
  Administered 2014-12-03: 10:00:00 via INTRAVENOUS

## 2014-12-03 MED ORDER — HYDROMORPHONE HCL 1 MG/ML IJ SOLN
INTRAMUSCULAR | Status: AC
  Filled 2014-12-03: qty 2

## 2014-12-03 MED ORDER — BEVACIZUMAB CHEMO INJECTION 400 MG/16ML
5.0000 mg/kg | Freq: Once | INTRAVENOUS | Status: AC
  Administered 2014-12-03: 475 mg via INTRAVENOUS
  Filled 2014-12-03: qty 16

## 2014-12-03 MED ORDER — OXALIPLATIN CHEMO INJECTION 100 MG/20ML
85.0000 mg/m2 | Freq: Once | INTRAVENOUS | Status: AC
  Administered 2014-12-03: 180 mg via INTRAVENOUS
  Filled 2014-12-03: qty 36

## 2014-12-03 MED ORDER — HYDROMORPHONE HCL 1 MG/ML IJ SOLN
2.0000 mg | INTRAMUSCULAR | Status: DC | PRN
  Administered 2014-12-03: 2 mg via INTRAVENOUS

## 2014-12-03 MED ORDER — HYDROMORPHONE HCL 2 MG PO TABS: ORAL_TABLET | ORAL | Status: DC

## 2014-12-03 MED ORDER — AMITRIPTYLINE HCL 50 MG PO TABS: 50.0000 mg | ORAL_TABLET | Freq: Every day | ORAL | Status: DC

## 2014-12-03 MED ORDER — DEXTROSE 5 % IV SOLN
400.0000 mg/m2 | Freq: Once | INTRAVENOUS | Status: AC
  Administered 2014-12-03: 836 mg via INTRAVENOUS
  Filled 2014-12-03: qty 41.8

## 2014-12-03 MED ORDER — SODIUM CHLORIDE 0.9 % IV SOLN
Freq: Once | INTRAVENOUS | Status: AC
  Administered 2014-12-03: 10:00:00 via INTRAVENOUS
  Filled 2014-12-03: qty 4

## 2014-12-03 MED ORDER — SODIUM CHLORIDE 0.9 % IV SOLN
2400.0000 mg/m2 | INTRAVENOUS | Status: DC
  Administered 2014-12-03: 5000 mg via INTRAVENOUS
  Filled 2014-12-03: qty 100

## 2014-12-03 NOTE — Progress Notes (Signed)
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    09/25/2014 Imaging CT abd/pelvis- Indeterminate nodules within the bilateral lung bases with dominant approximately 1.7 cm centrally necrotic nodule within the right lower lobe...   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   10/03/2014 Tumor Marker CEA NOT ELEVATED   10/09/2014 Imaging CT Chest- Bilateral pulmonary nodules of varying size are most consistent with pulmonary metastasis. There are approximately 6 lesions in each lung.   10/17/2014 Pathology Results Rectum, biopsy - INVASIVE ADENOCARCINOMA   10/29/2014 Pathology Results Diagnosis Lung, needle/core biopsy(ies), right lower lobe - METASTATIC ADENOCARCINOMA   11/04/2014 -  Radiation Therapy    11/05/2014 -  Chemotherapy FOLFOX + Avastin     HISTORY OF PRESENTING ILLNESS:  Andrew Kline 49 y.o. male is here because of stage IV rectal cancer.  He is here alone today. He goes by Andrew Kline. He is doing a little better in regards to pain.The patient presents today for chemotherapy and says that overall his mood and physical condition are getting better.  He states that he still cannot sit for prolonged periods or have bowel movements without pain.  He has 11 more radiation treatments.  Aside from the hospital, he does not stay sitting very often or go outside unless necessary.  He also states that the day after chemotherapy he is exhausted and cannot eat or drink anything cold for about 3  days.  The patient experienced diarrhea for about one week that ended last Friday.  This was eventually alleviated with Imodium.  His bowel movements are now are soft and occur in small amounts.    He states that his pain medication regimen is going well.  The patient takes his long acting twice per day and short acting only when necessary, usually once per day.  He says that his pain tolerance is higher than the average individual.  He admits that his hair has been periodically coming out in clumps.    The patient says that this past weekend has been his most comfortable yet.  He cooked and baked but did not go outside.    He has recently been approved for medicaid.  He questions his process of surgery.  MEDICAL HISTORY:  Past Medical History  Diagnosis Date  . Rectal pain   . Arthritis   . Renal cell cancer     2012.   [name] Rectal cancer     SURGICAL HISTORY: Past Surgical History  Procedure Laterality Date  . Hypospadias correction    . Laparoscopic partial nephrectomy  2012    right side  . Knee surgery Left     arthroscopy  . Flexible sigmoidoscopy N/A 09/30/2014    Procedure: FLEXIBLE SIGMOIDOSCOPY;  Surgeon: Danie Binder, MD;  Location: [facility];  Service: Endoscopy;  Laterality: N/A;  . Esophageal biopsy N/A 09/30/2014    Procedure: BIOPSY;  Surgeon: Danie Binder, MD;  Location: [facility];  Service: Endoscopy;  Laterality: N/A;  Anal Canal  . Flexible sigmoidoscopy N/A 10/17/2014  Procedure: FLEXIBLE SIGMOIDOSCOPY;  Surgeon: Danie Binder, MD;  Location: [facility] ENDO SUITE;  Service: Endoscopy;  Laterality: N/A;  1325    SOCIAL HISTORY: History   Social History  . Marital Status: Divorced    Spouse Name: N/A  . Number of Children: 2  . Years of Education: N/A   Occupational History  . retail    Social History Main Topics  . Smoking status: Current Every Day Smoker -- 1.00 packs/day for 30 years    Types: Cigarettes  . Smokeless tobacco: Not on file     Comment: a  little over a pack daily  . Alcohol Use: 0.0 oz/week    0 Standard drinks or equivalent per week     Comment: Occ  . Drug Use: No  . Sexual Activity: Yes    Birth Control/ Protection: None   Other Topics Concern  . Not on file   Social History Narrative  He is from [place], [place]. Moved here in March. Smoker, 1 ppd. Was 1.5 ppd. ETOH, none. 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 He is divorced. Lives with fiance. 2 children. 25 and 38 yo. No grandchildren.  FAMILY HISTORY: Family History  Problem Relation Age of Onset  . Lung cancer Maternal Grandfather   . Colon cancer Maternal Uncle     age 10  . Diabetes Other   . Healthy Mother    has no family status information on file.   Mother living, 74 yo. Still working as a Audiological scientist. Father living, 48-70 yo. 1 brother, 1 sister, 1 step sister. Maternal uncle has colon cancer. He is married to a GI nurse. So he has people to go to with questions and is informed on what is happening. History of diabetes in the family. No history of heart disease in the family.  ALLERGIES:  has No Known Allergies.  MEDICATIONS:  Current Outpatient Prescriptions  Medication Sig Dispense Refill  . amitriptyline (ELAVIL) 50 MG tablet Take 1 tablet (50 mg total) by mouth at bedtime. 30 tablet 2  . Bevacizumab (AVASTIN IV) Inject into the vein every 14 (fourteen) days. To start November 19, 2014    . dextrose 5 % SOLN 1,000 mL with fluorouracil 5 GM/100ML SOLN Inject into the vein every 14 (fourteen) days. To start November 05, 2014. To infuse over 46 hours via pump.    [name] docusate sodium (COLACE) 100 MG capsule Take 1 capsule (100 mg total) by mouth every 12 (twelve) hours. 60 capsule 0  . HYDROmorphone (DILAUDID) 2 MG tablet Take 3 tablets every 4 hours as needed for pain. 100 tablet 0  . leucovorin 50 MG injection Inject into the vein every 14 (fourteen)  days. To start November 05, 2014    . lidocaine-hydrocortisone (ANAMANTLE) 3-1 % KIT Place 1 application rectally 2 (two) times daily. For two weeks, alternate with Rectiv. 28 each 1  . lidocaine-prilocaine (EMLA) cream Apply a quarter size amount to port site 1 hour prior to chemo. Do not rub in. Cover with plastic wrap. 30 g 3  . morphine (MS CONTIN) 30 MG 12 hr tablet Take 1 tablet (30 mg total) by mouth every 12 (twelve) hours. 60 tablet 0  . nitroGLYCERIN (NITROGLYN) 2 % ointment Apply 0.5 inches topically daily. Anal area    . ondansetron (ZOFRAN) 8 MG tablet Take 1 tablet (8 mg total) by mouth every 8 (eight) hours  as needed for nausea or vomiting. 60 tablet 1  . OXALIPLATIN IV Inject into the vein every 14 (fourteen) days. To start November 05, 2014    . prochlorperazine (COMPAZINE) 10 MG tablet Take 1 tablet (10 mg total) by mouth every 6 (six) hours as needed for nausea or vomiting. 60 tablet 0   No current facility-administered medications for this visit.   Facility-Administered Medications Ordered in Other Visits  Medication Dose Route Frequency Provider Last Rate Last Dose  . bevacizumab (AVASTIN) 475 mg in sodium chloride 0.9 % 100 mL chemo infusion  5 mg/kg (Treatment Plan Actual) Intravenous Once Patrici Ranks, MD      . fluorouracil (ADRUCIL) 5,000 mg in sodium chloride 0.9 % 150 mL chemo infusion  2,400 mg/m2 (Treatment Plan Actual) Intravenous 1 day or 1 dose Patrici Ranks, MD      . leucovorin 836 mg in dextrose 5 % 250 mL infusion  400 mg/m2 (Treatment Plan Actual) Intravenous Once Patrici Ranks, MD 146 mL/hr at 12/03/14 1032 836 mg at 12/03/14 1032  . oxaliplatin (ELOXATIN) 180 mg in dextrose 5 % 500 mL chemo infusion  85 mg/m2 (Treatment Plan Actual) Intravenous Once Patrici Ranks, MD 268 mL/hr at 12/03/14 1031 180 mg at 12/03/14 1031    Review of Systems  Constitutional: Positive for malaise/fatigue and diaphoresis.  Respiratory: Negative for sputum production.     Gastrointestinal: Positive for blood in stool.  Genitourinary: Positive for rectum pain. Psychiatric/Behavioral: The patient is not nervous/anxious.    14 point ROS was done and is otherwise as detailed above or in HPI   PHYSICAL EXAMINATION:  ECOG PERFORMANCE STATUS: 1 - Symptomatic but completely ambulatory  Filed Vitals:   12/03/14 0857  BP: 142/88  Pulse: 84  Temp: 98.2 F (36.8 C)  Resp: 20   Filed Weights   12/03/14 0857  Weight: 201 lb (91.173 kg)    Physical Exam  Constitutional: He is oriented to person, place, and time and well-developed, well-nourished, and in no distress.   sitting upright in the chemotherapy infusion bed. He is working on a computer HENT:  Head: Normocephalic and atraumatic.  Nose: Nose normal.  Mouth/Throat: Oropharynx is clear and moist. No oropharyngeal exudate.  Dentures on top.  Eyes: Conjunctivae and EOM are normal. Pupils are equal, round, and reactive to light. Right eye exhibits no discharge. Left eye exhibits no discharge. No scleral icterus.  Neck: Normal range of motion. Neck supple. No tracheal deviation present. No thyromegaly present.  Cardiovascular: Normal rate, regular rhythm and normal heart sounds.  Exam reveals no gallop and no friction rub.   No murmur heard. Pulmonary/Chest: Effort normal and breath sounds normal. He has no wheezes. He has no rales.  Abdominal: Soft. Bowel sounds are normal. He exhibits no distension and no mass. There is no tenderness. There is no rebound and no guarding.  Genitourinary:   Musculoskeletal: Normal range of motion. He exhibits no edema.  Lymphadenopathy:    He has no cervical adenopathy.  Neurological: He is alert and oriented to person, place, and time. He has normal reflexes. No cranial nerve deficit. Gait normal. Coordination normal.  Skin: Skin is warm and dry. No rash noted.  Psychiatric: Mood, memory, affect and judgment normal.  Nursing note and vitals reviewed.   LABORATORY  DATA:  I have reviewed the data as listed Lab Results  Component Value Date   WBC 5.3 12/03/2014   HGB 14.6 12/03/2014   HCT 42.0 12/03/2014  MCV 90.1 12/03/2014   PLT 197 12/03/2014   CMP     Component Value Date/Time   NA 138 12/03/2014 0850   K 3.5 12/03/2014 0850   CL 103 12/03/2014 0850   CO2 26 12/03/2014 0850   GLUCOSE 122* 12/03/2014 0850   BUN 6 12/03/2014 0850   CREATININE 0.73 12/03/2014 0850   CALCIUM 8.3* 12/03/2014 0850   PROT 6.4* 12/03/2014 0850   ALBUMIN 3.5 12/03/2014 0850   AST 17 12/03/2014 0850   ALT 15* 12/03/2014 0850   ALKPHOS 81 12/03/2014 0850   BILITOT 0.4 12/03/2014 0850   GFRNONAA >60 12/03/2014 0850   GFRAA >60 12/03/2014 0850    ASSESSMENT & PLAN:  STAGE IV Rectal vs. Anal carcinoma, adenocarcinoma Pulmonary metastases Rectal pain interfering with function Good PS History of partial nephrectomy 2012 at [facility] in [place] FOLFOX/AVASTIN XRT for palliation   He is doing well and we will continue forward with chemotherapy with FOLFOX and Avastin. Pain is finally improving. He appears much more comfortable today. He has a decreased need for his breakthrough medications.  I again reviewed the indications for surgery stage IV disease with the patient. We have referred him to Dr. Arnoldo Morale we will see in the next several weeks.  I have refilled his Elavil and dilaudid. We will see him again in 2 weeks. He knows to call the clinic with any questions or concerns prior to his next follow-up.  I eliminated his 5-FU bolus today given his prolonged course of diarrhea; although he insists it is only radiation related.  This note was electronically signed.    This document serves as a record of services personally performed by Ancil Linsey, MD. It was created on her behalf by Janace Hoard, a trained medical scribe. The creation of this record is based on the scribe's personal observations and the provider's statements to  them. This document has been checked and approved by the attending provider.  I have reviewed the above documentation for accuracy and completeness, and I agree with the above.  Kelby Fam. [name], MD

## 2014-12-03 NOTE — Progress Notes (Signed)
Tolerated chemo well. Ambulatory on discharge, continuous infusion pump intact.

## 2014-12-03 NOTE — Patient Instructions (Signed)
[facility] Discharge Instructions for Patients Receiving Chemotherapy  Today you received the following chemotherapy agents:  Oxaliplatin, leucovorin, 34f, and Avastin.  To help prevent nausea and vomiting after your treatment, we encourage you to take your nausea medication as directed. If you develop nausea and vomiting, or diarrhea that is not controlled by your medication, call the clinic.      The clinic phone number is [phone] Office hours are Monday-Friday 8:30am-5:00pm.  BELOW ARE SYMPTOMS THAT SHOULD BE REPORTED IMMEDIATELY:  *FEVER GREATER THAN 101.0 F  *CHILLS WITH OR WITHOUT FEVER  NAUSEA AND VOMITING THAT IS NOT CONTROLLED WITH YOUR NAUSEA MEDICATION  *UNUSUAL SHORTNESS OF BREATH  *UNUSUAL BRUISING OR BLEEDING  TENDERNESS IN MOUTH AND THROAT WITH OR WITHOUT PRESENCE OF ULCERS  *URINARY PROBLEMS  *BOWEL PROBLEMS  UNUSUAL RASH Items with * indicate a potential emergency and should be followed up as soon as possible. If you have an emergency after office hours please contact your primary care physician or go to the nearest emergency department.  Please call the clinic during office hours if you have any questions or concerns.   You may also contact the Patient Navigator at ([phone] you have any questions or need assistance in obtaining follow up care. _____________________________________________________________________ Have you asked about our STAR program?    STAR stands for Survivorship Training and Rehabilitation, and this is a nationally recognized cancer care program that focuses on survivorship and rehabilitation.  Cancer and cancer treatments may cause problems, such as, pain, making you feel tired and keeping you from doing the things that you need or want to do. Cancer rehabilitation can help. Our goal is to reduce these troubling effects and help you have the best quality of life possible.  You may receive a survey  from a nurse that asks questions about your current state of health.  Based on the survey results, all eligible patients will be [facility] for [facility] so we can better serve you! A frequently asked questions sheet is available upon request.

## 2014-12-04 LAB — CEA: CEA: 2.8 ng/mL (ref 0.0–4.7)

## 2014-12-05 ENCOUNTER — Encounter (HOSPITAL_COMMUNITY): Payer: Self-pay

## 2014-12-05 ENCOUNTER — Encounter (HOSPITAL_BASED_OUTPATIENT_CLINIC_OR_DEPARTMENT_OTHER): Payer: Medicaid Other

## 2014-12-05 VITALS — BP 131/76 | HR 101 | Temp 98.4°F | Resp 20

## 2014-12-05 DIAGNOSIS — C20 Malignant neoplasm of rectum: Secondary | ICD-10-CM

## 2014-12-05 DIAGNOSIS — C7800 Secondary malignant neoplasm of unspecified lung: Principal | ICD-10-CM

## 2014-12-05 DIAGNOSIS — C7801 Secondary malignant neoplasm of right lung: Secondary | ICD-10-CM

## 2014-12-05 DIAGNOSIS — Z452 Encounter for adjustment and management of vascular access device: Secondary | ICD-10-CM | POA: Diagnosis present

## 2014-12-05 MED ORDER — HEPARIN SOD (PORK) LOCK FLUSH 100 UNIT/ML IV SOLN
INTRAVENOUS | Status: AC
  Filled 2014-12-05: qty 5

## 2014-12-05 MED ORDER — HEPARIN SOD (PORK) LOCK FLUSH 100 UNIT/ML IV SOLN
500.0000 [IU] | Freq: Once | INTRAVENOUS | Status: AC | PRN
  Administered 2014-12-05: 500 [IU]

## 2014-12-05 MED ORDER — SODIUM CHLORIDE 0.9 % IJ SOLN
10.0000 mL | INTRAMUSCULAR | Status: DC | PRN
Start: 2014-12-05 — End: 2014-12-05
  Administered 2014-12-05: 10 mL
  Filled 2014-12-05: qty 10

## 2014-12-05 NOTE — Progress Notes (Signed)
Andrew Kline presented for Portacath de-access and flush.  Proper placement of portacath confirmed by CXR.  Portacath located right chest wall accessed with  H 20 needle.  Good blood return present. Portacath flushed with 30m NS and 500U/553mHeparin and needle removed intact.  Procedure tolerated well and without incident.  Pump removed

## 2014-12-05 NOTE — Patient Instructions (Signed)
[place] at [facility] Discharge Instructions  RECOMMENDATIONS MADE BY THE CONSULTANT AND ANY TEST RESULTS WILL BE SENT TO YOUR REFERRING PHYSICIAN.  Port flush today and pump removed Follow up as scheduled  Thank you for choosing [place] at [facility] to provide your oncology and hematology care.  To afford each patient quality time with our provider, please arrive at least 15 minutes before your scheduled appointment time.    You need to re-schedule your appointment should you arrive 10 or more minutes late.  We strive to give you quality time with our providers, and arriving late affects you and other patients whose appointments are after yours.  Also, if you no show three or more times for appointments you may be dismissed from the clinic at the providers discretion.     Again, thank you for choosing [facility].  Our hope is that these requests will decrease the amount of time that you wait before being seen by our physicians.       _____________________________________________________________  Should you have questions after your visit to [facility], please contact our office at [phone] between the hours of 8:30 a.m. and 4:30 p.m.  Voicemails left after 4:30 p.m. will not be returned until the following business day.  For prescription refill requests, have your pharmacy contact our office.

## 2014-12-08 ENCOUNTER — Encounter (HOSPITAL_COMMUNITY): Payer: Self-pay | Admitting: Lab

## 2014-12-08 NOTE — Progress Notes (Signed)
Referral to dr Arnoldo Morale.  Records faxed on 8/8.  Appt is 8/18 at 9

## 2014-12-10 ENCOUNTER — Encounter (HOSPITAL_BASED_OUTPATIENT_CLINIC_OR_DEPARTMENT_OTHER): Payer: Medicaid Other

## 2014-12-10 ENCOUNTER — Encounter (HOSPITAL_COMMUNITY): Payer: Self-pay

## 2014-12-10 ENCOUNTER — Other Ambulatory Visit (HOSPITAL_COMMUNITY): Payer: Self-pay | Admitting: *Deleted

## 2014-12-10 VITALS — BP 133/81 | HR 84 | Temp 98.1°F | Resp 18 | Wt 194.0 lb

## 2014-12-10 DIAGNOSIS — C20 Malignant neoplasm of rectum: Secondary | ICD-10-CM | POA: Diagnosis not present

## 2014-12-10 DIAGNOSIS — C7801 Secondary malignant neoplasm of right lung: Secondary | ICD-10-CM | POA: Diagnosis not present

## 2014-12-10 DIAGNOSIS — R634 Abnormal weight loss: Secondary | ICD-10-CM

## 2014-12-10 DIAGNOSIS — C7800 Secondary malignant neoplasm of unspecified lung: Principal | ICD-10-CM

## 2014-12-10 MED ORDER — HEPARIN SOD (PORK) LOCK FLUSH 100 UNIT/ML IV SOLN
500.0000 [IU] | Freq: Once | INTRAVENOUS | Status: AC
  Administered 2014-12-10: 500 [IU] via INTRAVENOUS

## 2014-12-10 MED ORDER — HEPARIN SOD (PORK) LOCK FLUSH 100 UNIT/ML IV SOLN
INTRAVENOUS | Status: AC
  Filled 2014-12-10: qty 5

## 2014-12-10 MED ORDER — SODIUM CHLORIDE 0.9 % IJ SOLN
10.0000 mL | INTRAMUSCULAR | Status: DC | PRN
  Administered 2014-12-10: 10 mL via INTRAVENOUS
  Filled 2014-12-10: qty 10

## 2014-12-10 MED ORDER — SODIUM CHLORIDE 0.9 % IV SOLN
INTRAVENOUS | Status: DC
  Administered 2014-12-10: 12:00:00 via INTRAVENOUS

## 2014-12-10 MED ORDER — SODIUM CHLORIDE 0.9 % IV SOLN
INTRAVENOUS | Status: AC
  Administered 2015-08-26: 14:00:00 via INTRAVENOUS

## 2014-12-10 NOTE — Patient Instructions (Signed)
[place] at [facility] Discharge Instructions  RECOMMENDATIONS MADE BY THE CONSULTANT AND ANY TEST RESULTS WILL BE SENT TO YOUR REFERRING PHYSICIAN.  One liter of IV fluids given today. Imodium instruction sheet given - please follow the directions for taking PRECISELY.  Call the clinic tomorrow if your diarrhea persists. We will check your potassium level today; if it is low, we will notify you and a prescription will be called to your pharmacy.   Thank you for choosing [place] at [facility] to provide your oncology and hematology care.  To afford each patient quality time with our provider, please arrive at least 15 minutes before your scheduled appointment time.    You need to re-schedule your appointment should you arrive 10 or more minutes late.  We strive to give you quality time with our providers, and arriving late affects you and other patients whose appointments are after yours.  Also, if you no show three or more times for appointments you may be dismissed from the clinic at the providers discretion.     Again, thank you for choosing [facility].  Our hope is that these requests will decrease the amount of time that you wait before being seen by our physicians.       _____________________________________________________________  Should you have questions after your visit to [facility], please contact our office at [phone] between the hours of 8:30 a.m. and 4:30 p.m.  Voicemails left after 4:30 p.m. will not be returned until the following business day.  For prescription refill requests, have your pharmacy contact our office.

## 2014-12-10 NOTE — Progress Notes (Signed)
1150:  Dr. [name] notified of pt's pain and weight loss.  No orders rec'd.  Instructed to give Imodium instruction sheet, infuse IVF, collect stool specimen, and have the pt call the clinic tomorrow if diarrhea has not subsided. Pt notified and verbalizes understanding.   1345:  IVF infused.  A&Ox4.  VSS.  Pt has been unable to provide stool specimen during his time here today. Discharged ambulatory in c/o fiancee for transport home.

## 2014-12-17 ENCOUNTER — Encounter (HOSPITAL_BASED_OUTPATIENT_CLINIC_OR_DEPARTMENT_OTHER): Payer: Medicaid Other | Admitting: Hematology & Oncology

## 2014-12-17 ENCOUNTER — Encounter (HOSPITAL_BASED_OUTPATIENT_CLINIC_OR_DEPARTMENT_OTHER): Payer: Medicaid Other

## 2014-12-17 VITALS — BP 157/88 | HR 77 | Temp 98.1°F | Resp 20

## 2014-12-17 VITALS — BP 134/81 | HR 84 | Temp 98.1°F | Resp 20 | Wt 196.2 lb

## 2014-12-17 DIAGNOSIS — C7801 Secondary malignant neoplasm of right lung: Secondary | ICD-10-CM | POA: Diagnosis not present

## 2014-12-17 DIAGNOSIS — R918 Other nonspecific abnormal finding of lung field: Secondary | ICD-10-CM | POA: Diagnosis not present

## 2014-12-17 DIAGNOSIS — C20 Malignant neoplasm of rectum: Secondary | ICD-10-CM

## 2014-12-17 DIAGNOSIS — Z5112 Encounter for antineoplastic immunotherapy: Secondary | ICD-10-CM | POA: Diagnosis not present

## 2014-12-17 DIAGNOSIS — C7800 Secondary malignant neoplasm of unspecified lung: Principal | ICD-10-CM

## 2014-12-17 DIAGNOSIS — Z5111 Encounter for antineoplastic chemotherapy: Secondary | ICD-10-CM | POA: Diagnosis not present

## 2014-12-17 LAB — COMPREHENSIVE METABOLIC PANEL
ALK PHOS: 76 U/L (ref 38–126)
ALT: 17 U/L (ref 17–63)
ANION GAP: 7 (ref 5–15)
AST: 17 U/L (ref 15–41)
Albumin: 3.4 g/dL — ABNORMAL LOW (ref 3.5–5.0)
BILIRUBIN TOTAL: 0.4 mg/dL (ref 0.3–1.2)
BUN: 7 mg/dL (ref 6–20)
CALCIUM: 8.4 mg/dL — AB (ref 8.9–10.3)
CO2: 27 mmol/L (ref 22–32)
CREATININE: 0.85 mg/dL (ref 0.61–1.24)
Chloride: 104 mmol/L (ref 101–111)
GFR calc non Af Amer: >60 mL/min (ref >60)
GLUCOSE: 115 mg/dL — AB (ref 65–99)
Potassium: 3.5 mmol/L (ref 3.5–5.1)
Sodium: 138 mmol/L (ref 135–145)
TOTAL PROTEIN: 6.3 g/dL — AB (ref 6.5–8.1)

## 2014-12-17 LAB — CBC WITH DIFFERENTIAL/PLATELET
Basophils Absolute: 0 10*3/uL (ref 0.0–0.1)
Basophils Relative: 0 % (ref 0–1)
Eosinophils Absolute: 0.3 10*3/uL (ref 0.0–0.7)
Eosinophils Relative: 4 % (ref 0–5)
HEMATOCRIT: 41.4 % (ref 39.0–52.0)
HEMOGLOBIN: 14.2 g/dL (ref 13.0–17.0)
LYMPHS ABS: 0.5 10*3/uL — AB (ref 0.7–4.0)
LYMPHS PCT: 7 % — AB (ref 12–46)
MCH: 31.1 pg (ref 26.0–34.0)
MCHC: 34.3 g/dL (ref 30.0–36.0)
MCV: 90.8 fL (ref 78.0–100.0)
MONOS PCT: 14 % — AB (ref 3–12)
Monocytes Absolute: 1 10*3/uL (ref 0.1–1.0)
NEUTROS ABS: 5.3 10*3/uL (ref 1.7–7.7)
NEUTROS PCT: 75 % (ref 43–77)
Platelets: 229 10*3/uL (ref 150–400)
RBC: 4.56 MIL/uL (ref 4.22–5.81)
RDW: 15 % (ref 11.5–15.5)
WBC: 7.1 10*3/uL (ref 4.0–10.5)

## 2014-12-17 LAB — URINALYSIS, DIPSTICK ONLY
BILIRUBIN URINE: NEGATIVE
Glucose, UA: NEGATIVE mg/dL
HGB URINE DIPSTICK: NEGATIVE
Ketones, ur: NEGATIVE mg/dL
Leukocytes, UA: NEGATIVE
NITRITE: NEGATIVE
PH: 5.5 (ref 5.0–8.0)
Protein, ur: NEGATIVE mg/dL
Specific Gravity, Urine: 1.025 (ref 1.005–1.030)
UROBILINOGEN UA: 0.2 mg/dL (ref 0.0–1.0)

## 2014-12-17 MED ORDER — SODIUM CHLORIDE 0.9 % IJ SOLN: 10.0000 mL | INTRAMUSCULAR | Status: DC | PRN

## 2014-12-17 MED ORDER — LEUCOVORIN CALCIUM INJECTION 350 MG
400.0000 mg/m2 | Freq: Once | INTRAMUSCULAR | Status: AC
  Administered 2014-12-17: 836 mg via INTRAVENOUS
  Filled 2014-12-17: qty 41.8

## 2014-12-17 MED ORDER — FLUOROURACIL CHEMO INJECTION 5 GM/100ML
2400.0000 mg/m2 | INTRAVENOUS | Status: DC
  Administered 2014-12-17: 5000 mg via INTRAVENOUS
  Filled 2014-12-17: qty 100

## 2014-12-17 MED ORDER — SODIUM CHLORIDE 0.9 % IV SOLN
Freq: Once | INTRAVENOUS | Status: AC
  Administered 2014-12-17: 11:00:00 via INTRAVENOUS
  Filled 2014-12-17: qty 4

## 2014-12-17 MED ORDER — MORPHINE SULFATE ER 30 MG PO TBCR: 30.0000 mg | EXTENDED_RELEASE_TABLET | Freq: Two times a day (BID) | ORAL | Status: DC

## 2014-12-17 MED ORDER — DEXTROSE 5 % IV SOLN
85.0000 mg/m2 | Freq: Once | INTRAVENOUS | Status: AC
  Administered 2014-12-17: 180 mg via INTRAVENOUS
  Filled 2014-12-17: qty 36

## 2014-12-17 MED ORDER — SODIUM CHLORIDE 0.9 % IV SOLN
5.0000 mg/kg | Freq: Once | INTRAVENOUS | Status: AC
  Administered 2014-12-17: 475 mg via INTRAVENOUS
  Filled 2014-12-17: qty 16

## 2014-12-17 MED ORDER — DEXTROSE 5 % IV SOLN
Freq: Once | INTRAVENOUS | Status: AC
Start: 2014-12-17 — End: 2014-12-17
  Administered 2014-12-17: 11:00:00 via INTRAVENOUS

## 2014-12-17 MED ORDER — HEPARIN SOD (PORK) LOCK FLUSH 100 UNIT/ML IV SOLN: 500.0000 [IU] | Freq: Once | INTRAVENOUS | Status: DC | PRN

## 2014-12-17 MED ORDER — SODIUM CHLORIDE 0.9 % IV SOLN
Freq: Once | INTRAVENOUS | Status: AC
  Administered 2014-12-17: 10:00:00 via INTRAVENOUS

## 2014-12-17 NOTE — Progress Notes (Signed)
Otis Brace Tolerated chemotherapy well Discharged ambulatory with ambulatory pump

## 2014-12-17 NOTE — Progress Notes (Signed)
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    09/25/2014 Imaging CT abd/pelvis- Indeterminate nodules within the bilateral lung bases with dominant approximately 1.7 cm centrally necrotic nodule within the right lower lobe...   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   10/03/2014 Tumor Marker CEA NOT ELEVATED   10/09/2014 Imaging CT Chest- Bilateral pulmonary nodules of varying size are most consistent with pulmonary metastasis. There are approximately 6 lesions in each lung.   10/17/2014 Pathology Results Rectum, biopsy - INVASIVE ADENOCARCINOMA   10/29/2014 Pathology Results Diagnosis Lung, needle/core biopsy(ies), right lower lobe - METASTATIC ADENOCARCINOMA   11/04/2014 - 12/18/2014 Radiation Therapy Dr. Lisbeth Kline   11/05/2014 -  Chemotherapy FOLFOX + Avastin     HISTORY OF PRESENTING ILLNESS:  Andrew Kline 49 y.o. male is here because of stage IV rectal cancer.   The patient is here alone today. He is excited to be wrapping up his radiation therapy.   He states "I do feel better. I still have some typical pain down below." He also states that "it comes and it's going now, it's not a steady stream," but says he feels better than he did. He wonders why it is "so damn painful," with regards to the side-effects of his treatment.  He says lying on his back (for the radiation treatments) is also painful. He says "Monday he felt [the radiation] going in" and he "could 'see' something  moving in his stomach." "It was a weird experience."   He says "if he ain't gotta get up, he doesn't want to get up," because sleeping helps him to feel better. He states that he is just trying to find things to do these days, but he says "I can't be out in that heat," and that although all he did was walk from his front stoop to his sidewalk to get the mail and back, he was "worn out."   When questioned about his smoking, Andrew Kline stated that he has cut down to about a half of a pack of cigarettes per day. He says he smokes because it helps him alleviate his stress and feel better during his treatment. He was advised on cutting back even more of his smoking.  He's not taking the Dilaudid as often as he was. He confirms that he is back on his Colace. He states that he does not need more Zofran for nausea, and that he hasn't really taken it all that much anyway, "which is a good thing, he can only deal with so much."  He confirms that he has been eating okay, and that he is having no more diarrhea. He states that he's got pain like he's "gotta go, but he can't," and is experiencing tenesmus.  He also states: "I am able to wipe my butt finally," and that on Sunday he felt like jumping into the tub. He notes that the "redness is almost all gone down there."   He says he spoke to his mother the other day and feels comforted with the  way Andrew Kline is on top of things, and that if he was elsewhere, he wouldn't be doing as well. 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   He denies neuropathy.  MEDICAL HISTORY:  Past Medical History  Diagnosis Date  . Rectal pain   . Arthritis   . Renal cell cancer     2012.   [name] Rectal cancer     SURGICAL HISTORY: Past Surgical History  Procedure Laterality Date  . Hypospadias correction    . Laparoscopic partial nephrectomy  2012    right side  . Knee surgery Left     arthroscopy  . Flexible sigmoidoscopy N/A 09/30/2014    Procedure: FLEXIBLE  SIGMOIDOSCOPY;  Surgeon: Andrew Binder, MD;  Location: [facility];  Service: Endoscopy;  Laterality: N/A;  . Esophageal biopsy N/A 09/30/2014    Procedure: BIOPSY;  Surgeon: Andrew Binder, MD;  Location: [facility];  Service: Endoscopy;  Laterality: N/A;  Anal Canal  . Flexible sigmoidoscopy N/A 10/17/2014    Procedure: FLEXIBLE SIGMOIDOSCOPY;  Surgeon: Andrew Binder, MD;  Location: [facility] SUITE;  Service: Endoscopy;  Laterality: N/A;  1325    SOCIAL HISTORY: Social History   Social History  . Marital Status: Divorced    Spouse Name: N/A  . Number of Children: 2  . Years of Education: N/A   Occupational History  . retail    Social History Main Topics  . Smoking status: Current Every Day Smoker -- 1.00 packs/day for 30 years    Types: Cigarettes  . Smokeless tobacco: Not on file     Comment: a little over a pack daily  . Alcohol Use: 0.0 oz/week    0 Standard drinks or equivalent per week     Comment: Occ  . Drug Use: No  . Sexual Activity: Yes    Birth Control/ Protection: None   Other Topics Concern  . Not on file   Social History Narrative  He is from [place], [place]. Moved here in March. Smoker, 1 ppd. Was 1.5 ppd. ETOH, none. 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 He is divorced. Lives with fiance. 2 children. 27 and 35 yo. No grandchildren.  FAMILY HISTORY: Family History  Problem Relation Age of Onset  . Lung cancer Maternal Grandfather   . Colon cancer Maternal Uncle     age 74  . Diabetes Other   . Healthy Mother    has no family status information on file.   Mother living, 55 yo. Still working as a Audiological scientist. Father living, 9-70 yo. 1 brother, 1 sister, 1 step sister. Maternal uncle has colon cancer. He is married to a GI nurse. So he has people to go to with questions and is informed on what is happening. History of diabetes in the family. No history of  heart disease in the family.  ALLERGIES:  has No Known Allergies.  MEDICATIONS:  Current Outpatient Prescriptions  Medication Sig Dispense Refill  . amitriptyline (ELAVIL) 50 MG tablet Take 1 tablet (50 mg total) by mouth at bedtime. 30 tablet 2  . Bevacizumab (AVASTIN IV) Inject into the vein every 14 (fourteen) days. To start November 19, 2014    . dextrose 5 % SOLN 1,000 mL with fluorouracil 5 GM/100ML SOLN Inject into the vein every 14 (fourteen) days. To start November 05, 2014. To infuse over 46 hours via pump.    [name]  docusate sodium (COLACE) 100 MG capsule Take 1 capsule (100 mg total) by mouth every 12 (twelve) hours. 60 capsule 0  . leucovorin 50 MG injection Inject into the vein every 14 (fourteen) days. To start November 05, 2014    . lidocaine-hydrocortisone (ANAMANTLE) 3-1 % KIT Place 1 application rectally 2 (two) times daily. For two weeks, alternate with Rectiv. 28 each 1  . lidocaine-prilocaine (EMLA) cream Apply a quarter size amount to port site 1 hour prior to chemo. Do not rub in. Cover with plastic wrap. 30 g 3  . nitroGLYCERIN (NITROGLYN) 2 % ointment Apply 0.5 inches topically daily. Anal area    . ondansetron (ZOFRAN) 8 MG tablet Take 1 tablet (8 mg total) by mouth every 8 (eight) hours as needed for nausea or vomiting. 60 tablet 1  . OXALIPLATIN IV Inject into the vein every 14 (fourteen) days. To start November 05, 2014    . prochlorperazine (COMPAZINE) 10 MG tablet Take 1 tablet (10 mg total) by mouth every 6 (six) hours as needed for nausea or vomiting. 60 tablet 0  . citalopram (CELEXA) 40 MG tablet TAKE 1/2 TABLET BY MOUTH FOR 5 DAYS  THEN ONE TABLET DAILY THEREAFTER. 30 tablet 1  . HYDROmorphone (DILAUDID) 2 MG tablet Take 3 tablets every 4 hours as needed for pain. 100 tablet 0  . morphine (MS CONTIN) 30 MG 12 hr tablet Take 1 tablet (30 mg total) by mouth every 8 (eight) hours. 90 tablet 0   No current facility-administered medications for this visit.   Facility-Administered  Medications Ordered in Other Visits  Medication Dose Route Frequency Provider Last Rate Last Dose  . 0.9 %  sodium chloride infusion   Intravenous Continuous Patrici Ranks, MD        Review of Systems  Constitutional: Positive for malaise/fatigue.  Respiratory: Negative for sputum production.   Gastrointestinal: Negative for blood in stool, diarrhea Patient is experiencing abdominal (muscle) pain due to radiation therapy.  Genitourinary: Positive for rectum pain. Psychiatric/Behavioral: The patient is not nervous/anxious.    14 point ROS was done and is otherwise as detailed above or in HPI   PHYSICAL EXAMINATION:  ECOG PERFORMANCE STATUS: 1 - Symptomatic but completely ambulatory  Filed Vitals:   12/17/14 0926  BP: 134/81  Pulse: 84  Temp: 98.1 F (36.7 C)  Resp: 20   Filed Weights   12/17/14 0926  Weight: 196 lb 3.2 oz (88.996 kg)    Physical Exam  Constitutional: He is oriented to person, place, and time and well-developed, well-nourished, and in no distress.  Sitting upright. He is watching the television. HENT:  Head: Normocephalic and atraumatic.  Nose: Nose normal.  Mouth/Throat: Oropharynx is clear and moist. No oropharyngeal exudate.  Dentures on top.  Eyes: Conjunctivae and EOM are normal. Pupils are equal, round, and reactive to light. Right eye exhibits no discharge. Left eye exhibits no discharge. No scleral icterus.  Neck: Normal range of motion. Neck supple. No tracheal deviation present. No thyromegaly present.  Cardiovascular: Normal rate, regular rhythm and normal heart sounds.  Exam reveals no gallop and no friction rub.   No murmur heard. Pulmonary/Chest: Effort normal and breath sounds normal. He has no wheezes. He has no rales.  Abdominal: Soft. Bowel sounds are normal. He exhibits no distension and no mass. There is no tenderness. There is no rebound and no guarding.  Genitourinary:   Musculoskeletal: Normal range of motion. He exhibits no  edema.  Lymphadenopathy:  He has no cervical adenopathy.  Neurological: He is alert and oriented to person, place, and time. He has normal reflexes. No cranial nerve deficit. Gait normal. Coordination normal.  Skin: Skin is warm and dry. No rash noted.  Psychiatric: Mood, memory, affect and judgment normal.  Nursing note and vitals reviewed.   LABORATORY DATA:  I have reviewed the data as listed  Results for XABI, WITTLER (MRN [id number])   Ref. Range 12/17/2014 09:30  Sodium Latest Ref Range: 135-145 mmol/L 138  Potassium Latest Ref Range: 3.5-5.1 mmol/L 3.5  Chloride Latest Ref Range: 101-111 mmol/L 104  CO2 Latest Ref Range: 22-32 mmol/L 27  BUN Latest Ref Range: 6-20 mg/dL 7  Creatinine Latest Ref Range: 0.61-1.24 mg/dL 0.85  Calcium Latest Ref Range: 8.9-10.3 mg/dL 8.4 (L)  EGFR (Non-African Amer.) Latest Ref Range: >60 mL/min >60  EGFR (African American) Latest Ref Range: >60 mL/min >60  Glucose Latest Ref Range: 65-99 mg/dL 115 (H)  Anion gap Latest Ref Range: 5-15  7  Alkaline Phosphatase Latest Ref Range: 38-126 U/L 76  Albumin Latest Ref Range: 3.5-5.0 g/dL 3.4 (L)  AST Latest Ref Range: 15-41 U/L 17  ALT Latest Ref Range: 17-63 U/L 17  Total Protein Latest Ref Range: 6.5-8.1 g/dL 6.3 (L)  Total Bilirubin Latest Ref Range: 0.3-1.2 mg/dL 0.4  WBC Latest Ref Range: 4.0-10.5 K/uL 7.1  RBC Latest Ref Range: 4.22-5.81 MIL/uL 4.56  Hemoglobin Latest Ref Range: 13.0-17.0 g/dL 14.2  HCT Latest Ref Range: 39.0-52.0 % 41.4  MCV Latest Ref Range: 78.0-100.0 fL 90.8  MCH Latest Ref Range: 26.0-34.0 pg 31.1  MCHC Latest Ref Range: 30.0-36.0 g/dL 34.3  RDW Latest Ref Range: 11.5-15.5 % 15.0  Platelets Latest Ref Range: 150-400 K/uL 229  Neutrophils Latest Ref Range: 43-77 % 75  Lymphocytes Latest Ref Range: 12-46 % 7 (L)  Monocytes Relative Latest Ref Range: 3-12 % 14 (H)  Eosinophil Latest Ref Range: 0-5 % 4  Basophil Latest Ref Range: 0-1 % 0  NEUT# Latest Ref Range:  1.7-7.7 K/uL 5.3  Lymphocyte # Latest Ref Range: 0.7-4.0 K/uL 0.5 (L)  Monocyte # Latest Ref Range: 0.1-1.0 K/uL 1.0  Eosinophils Absolute Latest Ref Range: 0.0-0.7 K/uL 0.3  Basophils Absolute Latest Ref Range: 0.0-0.1 K/uL 0.0    Results for YOUSUF, AGER (MRN [id number])   Ref. Range 12/17/2014 09:40  Appearance Latest Ref Range: CLEAR  CLEAR  Bilirubin Urine Latest Ref Range: NEGATIVE  NEGATIVE  Color, Urine Latest Ref Range: YELLOW  YELLOW  Glucose Latest Ref Range: NEGATIVE mg/dL NEGATIVE  Hgb urine dipstick Latest Ref Range: NEGATIVE  NEGATIVE  Ketones, ur Latest Ref Range: NEGATIVE mg/dL NEGATIVE  Leukocytes, UA Latest Ref Range: NEGATIVE  NEGATIVE  Nitrite Latest Ref Range: NEGATIVE  NEGATIVE  pH Latest Ref Range: 5.0-8.0  5.5  Protein Latest Ref Range: NEGATIVE mg/dL NEGATIVE  Specific Gravity, Urine Latest Ref Range: 1.005-1.030  1.025  Urobilinogen, UA Latest Ref Range: 0.0-1.0 mg/dL 0.2       ASSESSMENT & PLAN:  STAGE IV Rectal vs. Anal carcinoma, adenocarcinoma Pulmonary metastases Rectal pain interfering with function Good PS History of partial nephrectomy 2012 at [facility] in [place] FOLFOX/AVASTIN XRT for palliation   He is doing well and we will continue forward with chemotherapy with FOLFOX and Avastin. Pain is finally improving. He appears much more comfortable today. He has a decreased need for his breakthrough medications.  I again reviewed the indications for surgery stage IV disease with the patient. We  have referred him to Dr. Arnoldo Morale we will see in the next several weeks.  I have refilled his Elavil and dilaudid. We will see him again in 2 weeks. He knows to call the clinic with any questions or concerns prior to his next follow-up.  Refills needed on Mr. Guile morphine.  This note was electronically signed.    This document serves as a record of services personally performed by Ancil Linsey, MD. It was created on her  behalf by Toni Amend, a trained medical scribe. The creation of this record is based on the scribe's personal observations and the provider's statements to them. This document has been checked and approved by the attending provider.  I have reviewed the above documentation for accuracy and completeness, and I agree with the above.  Kelby Fam. [name], MD

## 2014-12-17 NOTE — Patient Instructions (Signed)
[place] [facility] Discharge Instructions for Patients Receiving Chemotherapy  Today you received the following chemotherapy agents folfox plus avastin Follow up as scheduled Please call the clinic if you have any questions or concerns  To help prevent nausea and vomiting after your treatment, we encourage you to take your nausea medication    If you develop nausea and vomiting, or diarrhea that is not controlled by your medication, call the clinic.  The clinic phone number is [phone]. Office hours are Monday-Friday 8:30am-5:00pm.  BELOW ARE SYMPTOMS THAT SHOULD BE REPORTED IMMEDIATELY:  *FEVER GREATER THAN 101.0 F  *CHILLS WITH OR WITHOUT FEVER  NAUSEA AND VOMITING THAT IS NOT CONTROLLED WITH YOUR NAUSEA MEDICATION  *UNUSUAL SHORTNESS OF BREATH  *UNUSUAL BRUISING OR BLEEDING  TENDERNESS IN MOUTH AND THROAT WITH OR WITHOUT PRESENCE OF ULCERS  *URINARY PROBLEMS  *BOWEL PROBLEMS  UNUSUAL RASH Items with * indicate a potential emergency and should be followed up as soon as possible. If you have an emergency after office hours please contact your primary care physician or go to the nearest emergency department.  Please call the clinic during office hours if you have any questions or concerns.   You may also contact the Patient Navigator at [phone] should you have any questions or need assistance in obtaining follow up care. _____________________________________________________________________ Have you asked about our STAR program?    STAR stands for Survivorship Training and Rehabilitation, and this is a nationally recognized cancer care program that focuses on survivorship and rehabilitation.  Cancer and cancer treatments may cause problems, such as, pain, making you feel tired and keeping you from doing the things that you need or want to do. Cancer rehabilitation can help. Our goal is to reduce these troubling effects and help you have the best quality of  life possible.  You may receive a survey from a nurse that asks questions about your current state of health.  Based on the survey results, all eligible patients will be referred to the [facility] program for an evaluation so we can better serve you! A frequently asked questions sheet is available upon request.

## 2014-12-17 NOTE — Patient Instructions (Addendum)
[place] at [facility] Discharge Instructions  RECOMMENDATIONS MADE BY THE CONSULTANT AND ANY TEST RESULTS WILL BE SENT TO YOUR REFERRING PHYSICIAN.  Exam/discussion per Dr.Penland. Continue with chemotherapy as planned. Return to clinic in 2 weeks for chemotherapy as scheduled.  Thank you for choosing [place] at [facility] to provide your oncology and hematology care.  To afford each patient quality time with our provider, please arrive at least 15 minutes before your scheduled appointment time.    You need to re-schedule your appointment should you arrive 10 or more minutes late.  We strive to give you quality time with our providers, and arriving late affects you and other patients whose appointments are after yours.  Also, if you no show three or more times for appointments you may be dismissed from the clinic at the providers discretion.     Again, thank you for choosing [facility].  Our hope is that these requests will decrease the amount of time that you wait before being seen by our physicians.       _____________________________________________________________  Should you have questions after your visit to [facility], please contact our office at [phone] between the hours of 8:30 a.m. and 4:30 p.m.  Voicemails left after 4:30 p.m. will not be returned until the following business day.  For prescription refill requests, have your pharmacy contact our office.

## 2014-12-19 ENCOUNTER — Encounter (HOSPITAL_COMMUNITY): Payer: Self-pay

## 2014-12-19 ENCOUNTER — Encounter (HOSPITAL_BASED_OUTPATIENT_CLINIC_OR_DEPARTMENT_OTHER): Payer: Medicaid Other

## 2014-12-19 VITALS — BP 139/90 | HR 89 | Temp 98.0°F | Resp 18

## 2014-12-19 DIAGNOSIS — Z452 Encounter for adjustment and management of vascular access device: Secondary | ICD-10-CM

## 2014-12-19 DIAGNOSIS — C7801 Secondary malignant neoplasm of right lung: Secondary | ICD-10-CM

## 2014-12-19 DIAGNOSIS — C7800 Secondary malignant neoplasm of unspecified lung: Principal | ICD-10-CM

## 2014-12-19 DIAGNOSIS — C20 Malignant neoplasm of rectum: Secondary | ICD-10-CM

## 2014-12-19 MED ORDER — HEPARIN SOD (PORK) LOCK FLUSH 100 UNIT/ML IV SOLN
INTRAVENOUS | Status: AC
  Filled 2014-12-19: qty 5

## 2014-12-19 MED ORDER — HEPARIN SOD (PORK) LOCK FLUSH 100 UNIT/ML IV SOLN
500.0000 [IU] | Freq: Once | INTRAVENOUS | Status: AC | PRN
  Administered 2014-12-19: 500 [IU]

## 2014-12-19 MED ORDER — SODIUM CHLORIDE 0.9 % IJ SOLN
10.0000 mL | INTRAMUSCULAR | Status: DC | PRN
  Administered 2014-12-19: 10 mL
  Filled 2014-12-19: qty 10

## 2014-12-19 NOTE — Patient Instructions (Signed)
[place] at [facility] Discharge Instructions  RECOMMENDATIONS MADE BY THE CONSULTANT AND ANY TEST RESULTS WILL BE SENT TO YOUR REFERRING PHYSICIAN.  Pump removal and port flush today. Return as scheduled for chemo and office visit.  Thank you for choosing [place] at [facility] to provide your oncology and hematology care.  To afford each patient quality time with our provider, please arrive at least 15 minutes before your scheduled appointment time.    You need to re-schedule your appointment should you arrive 10 or more minutes late.  We strive to give you quality time with our providers, and arriving late affects you and other patients whose appointments are after yours.  Also, if you no show three or more times for appointments you may be dismissed from the clinic at the providers discretion.     Again, thank you for choosing [facility].  Our hope is that these requests will decrease the amount of time that you wait before being seen by our physicians.       _____________________________________________________________  Should you have questions after your visit to [facility], please contact our office at [phone] between the hours of 8:30 a.m. and 4:30 p.m.  Voicemails left after 4:30 p.m. will not be returned until the following business day.  For prescription refill requests, have your pharmacy contact our office.

## 2014-12-19 NOTE — Progress Notes (Signed)
1245:  Otis Brace presented for home infusion pump removal and port flush.  Portacath flushed with 54m NS and 500U/554mHeparin and needle removed intact.  Procedure tolerated well and without incident.

## 2014-12-25 ENCOUNTER — Other Ambulatory Visit (HOSPITAL_COMMUNITY): Payer: Self-pay | Admitting: Hematology & Oncology

## 2014-12-30 ENCOUNTER — Encounter: Payer: Self-pay | Admitting: *Deleted

## 2014-12-30 NOTE — Progress Notes (Signed)
[place] Clinical Social Work  Clinical Social Work was referred by patient for assistance due to rent concerns. Clinical Social Worker contacted patient at home to offer support and assess for needs. Pt recently received an eviction notice due to being behind on rent. Pt has been proactive and attempting to find assistance options and has connected with the [facility]. They are agreeable to funding three months of rent while he is going through treatment. Mr Victory is aware to bring documentation to send to the [facility] on 12/31/14. Financial counselor, Lendell Caprice plans to fax said paperwork when pt brings it in.  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 Pt very appreciative and will bring said paperwork on 12/31/14. He agrees to reach out as other needs arise.   Clinical Social Work interventions: Manufacturing engineer.  Loren Racer, [place] Tuesdays   [phone]

## 2014-12-31 ENCOUNTER — Ambulatory Visit (HOSPITAL_COMMUNITY): Payer: Self-pay | Admitting: Oncology

## 2014-12-31 ENCOUNTER — Encounter (HOSPITAL_BASED_OUTPATIENT_CLINIC_OR_DEPARTMENT_OTHER): Payer: Medicaid Other

## 2014-12-31 VITALS — BP 142/68 | HR 88 | Temp 98.0°F | Resp 16 | Wt 195.4 lb

## 2014-12-31 DIAGNOSIS — Z5112 Encounter for antineoplastic immunotherapy: Secondary | ICD-10-CM | POA: Diagnosis not present

## 2014-12-31 DIAGNOSIS — C20 Malignant neoplasm of rectum: Secondary | ICD-10-CM

## 2014-12-31 DIAGNOSIS — Z5111 Encounter for antineoplastic chemotherapy: Secondary | ICD-10-CM

## 2014-12-31 DIAGNOSIS — R918 Other nonspecific abnormal finding of lung field: Secondary | ICD-10-CM | POA: Diagnosis not present

## 2014-12-31 DIAGNOSIS — C7801 Secondary malignant neoplasm of right lung: Secondary | ICD-10-CM | POA: Diagnosis not present

## 2014-12-31 DIAGNOSIS — C7800 Secondary malignant neoplasm of unspecified lung: Principal | ICD-10-CM

## 2014-12-31 LAB — COMPREHENSIVE METABOLIC PANEL
ALBUMIN: 3.6 g/dL (ref 3.5–5.0)
ALT: 15 U/L — ABNORMAL LOW (ref 17–63)
AST: 18 U/L (ref 15–41)
Alkaline Phosphatase: 97 U/L (ref 38–126)
Anion gap: 7 (ref 5–15)
BILIRUBIN TOTAL: 0.5 mg/dL (ref 0.3–1.2)
BUN: 8 mg/dL (ref 6–20)
CHLORIDE: 105 mmol/L (ref 101–111)
CO2: 25 mmol/L (ref 22–32)
Calcium: 8.6 mg/dL — ABNORMAL LOW (ref 8.9–10.3)
Creatinine, Ser: 0.86 mg/dL (ref 0.61–1.24)
GFR calc Af Amer: >60 mL/min (ref >60)
GFR calc non Af Amer: >60 mL/min (ref >60)
GLUCOSE: 165 mg/dL — AB (ref 65–99)
POTASSIUM: 3.6 mmol/L (ref 3.5–5.1)
SODIUM: 137 mmol/L (ref 135–145)
TOTAL PROTEIN: 6.8 g/dL (ref 6.5–8.1)

## 2014-12-31 LAB — URINALYSIS, DIPSTICK ONLY
Bilirubin Urine: NEGATIVE
GLUCOSE, UA: NEGATIVE mg/dL
Hgb urine dipstick: NEGATIVE
Ketones, ur: NEGATIVE mg/dL
NITRITE: NEGATIVE
PH: 5.5 (ref 5.0–8.0)
Urobilinogen, UA: 0.2 mg/dL (ref 0.0–1.0)

## 2014-12-31 LAB — CBC WITH DIFFERENTIAL/PLATELET
BASOS ABS: 0 10*3/uL (ref 0.0–0.1)
BASOS PCT: 0 % (ref 0–1)
EOS ABS: 0.3 10*3/uL (ref 0.0–0.7)
Eosinophils Relative: 5 % (ref 0–5)
HEMATOCRIT: 43 % (ref 39.0–52.0)
Hemoglobin: 15 g/dL (ref 13.0–17.0)
Lymphocytes Relative: 9 % — ABNORMAL LOW (ref 12–46)
Lymphs Abs: 0.6 10*3/uL — ABNORMAL LOW (ref 0.7–4.0)
MCH: 32.3 pg (ref 26.0–34.0)
MCHC: 34.9 g/dL (ref 30.0–36.0)
MCV: 92.5 fL (ref 78.0–100.0)
MONO ABS: 0.8 10*3/uL (ref 0.1–1.0)
Monocytes Relative: 12 % (ref 3–12)
NEUTROS ABS: 5 10*3/uL (ref 1.7–7.7)
NEUTROS PCT: 74 % (ref 43–77)
Platelets: 227 10*3/uL (ref 150–400)
RBC: 4.65 MIL/uL (ref 4.22–5.81)
RDW: 16.2 % — AB (ref 11.5–15.5)
WBC: 6.7 10*3/uL (ref 4.0–10.5)

## 2014-12-31 MED ORDER — SODIUM CHLORIDE 0.9 % IV SOLN
5.0000 mg/kg | Freq: Once | INTRAVENOUS | Status: AC
  Administered 2014-12-31: 475 mg via INTRAVENOUS
  Filled 2014-12-31: qty 16

## 2014-12-31 MED ORDER — HEPARIN SOD (PORK) LOCK FLUSH 100 UNIT/ML IV SOLN: 500.0000 [IU] | Freq: Once | INTRAVENOUS | Status: DC | PRN

## 2014-12-31 MED ORDER — SODIUM CHLORIDE 0.9 % IJ SOLN: 10.0000 mL | INTRAMUSCULAR | Status: DC | PRN

## 2014-12-31 MED ORDER — DEXTROSE 5 % IV SOLN
400.0000 mg/m2 | Freq: Once | INTRAVENOUS | Status: AC
  Administered 2014-12-31: 836 mg via INTRAVENOUS
  Filled 2014-12-31: qty 35

## 2014-12-31 MED ORDER — DEXTROSE 5 % IV SOLN
Freq: Once | INTRAVENOUS | Status: AC
Start: 2014-12-31 — End: 2014-12-31
  Administered 2014-12-31: 10:00:00 via INTRAVENOUS

## 2014-12-31 MED ORDER — SODIUM CHLORIDE 0.9 % IV SOLN
2400.0000 mg/m2 | INTRAVENOUS | Status: DC
  Administered 2014-12-31: 5000 mg via INTRAVENOUS
  Filled 2014-12-31: qty 100

## 2014-12-31 MED ORDER — HYDROMORPHONE HCL 2 MG PO TABS: ORAL_TABLET | ORAL | Status: DC

## 2014-12-31 MED ORDER — DEXTROSE 5 % IV SOLN
85.0000 mg/m2 | Freq: Once | INTRAVENOUS | Status: AC
  Administered 2014-12-31: 180 mg via INTRAVENOUS
  Filled 2014-12-31: qty 36

## 2014-12-31 MED ORDER — SODIUM CHLORIDE 0.9 % IV SOLN
Freq: Once | INTRAVENOUS | Status: AC
  Administered 2014-12-31: 10:00:00 via INTRAVENOUS
  Filled 2014-12-31: qty 4

## 2014-12-31 NOTE — Progress Notes (Signed)
Patient tolerated infusion well.  VSS post infusion.  Refill for Dilaudid obtained from [facility] [place], and given to the patient.

## 2015-01-01 LAB — CEA: CEA: 3.8 ng/mL (ref 0.0–4.7)

## 2015-01-02 ENCOUNTER — Encounter (HOSPITAL_COMMUNITY): Payer: Self-pay

## 2015-01-02 ENCOUNTER — Encounter (HOSPITAL_COMMUNITY): Payer: Medicaid Other | Attending: Hematology & Oncology

## 2015-01-02 VITALS — BP 135/86 | HR 89 | Temp 98.5°F | Resp 20

## 2015-01-02 DIAGNOSIS — Z452 Encounter for adjustment and management of vascular access device: Secondary | ICD-10-CM | POA: Diagnosis not present

## 2015-01-02 DIAGNOSIS — C7800 Secondary malignant neoplasm of unspecified lung: Secondary | ICD-10-CM

## 2015-01-02 DIAGNOSIS — C20 Malignant neoplasm of rectum: Secondary | ICD-10-CM | POA: Diagnosis not present

## 2015-01-02 DIAGNOSIS — R918 Other nonspecific abnormal finding of lung field: Secondary | ICD-10-CM | POA: Insufficient documentation

## 2015-01-02 MED ORDER — SODIUM CHLORIDE 0.9 % IJ SOLN
10.0000 mL | INTRAMUSCULAR | Status: DC | PRN
  Administered 2015-01-02: 10 mL
  Filled 2015-01-02: qty 10

## 2015-01-02 MED ORDER — HEPARIN SOD (PORK) LOCK FLUSH 100 UNIT/ML IV SOLN
INTRAVENOUS | Status: AC
  Filled 2015-01-02: qty 5

## 2015-01-02 MED ORDER — HEPARIN SOD (PORK) LOCK FLUSH 100 UNIT/ML IV SOLN
500.0000 [IU] | Freq: Once | INTRAVENOUS | Status: AC | PRN
  Administered 2015-01-02: 500 [IU]

## 2015-01-02 NOTE — Patient Instructions (Signed)
[place] at [facility] Discharge Instructions  RECOMMENDATIONS MADE BY THE CONSULTANT AND ANY TEST RESULTS WILL BE SENT TO YOUR REFERRING PHYSICIAN.  Pump removal and port flush today. Return as scheduled for chemotherapy and office visit.   Thank you for choosing [place] at [facility] to provide your oncology and hematology care.  To afford each patient quality time with our provider, please arrive at least 15 minutes before your scheduled appointment time.    You need to re-schedule your appointment should you arrive 10 or more minutes late.  We strive to give you quality time with our providers, and arriving late affects you and other patients whose appointments are after yours.  Also, if you no show three or more times for appointments you may be dismissed from the clinic at the providers discretion.     Again, thank you for choosing [facility].  Our hope is that these requests will decrease the amount of time that you wait before being seen by our physicians.       _____________________________________________________________  Should you have questions after your visit to [facility], please contact our office at [phone] between the hours of 8:30 a.m. and 4:30 p.m.  Voicemails left after 4:30 p.m. will not be returned until the following business day.  For prescription refill requests, have your pharmacy contact our office.

## 2015-01-02 NOTE — Progress Notes (Signed)
Otis Brace presented for infusion pump removal and port flush. Portacath flushed with 44m NS and 500U/535mHeparin and needle removed intact.  Procedure tolerated well and without incident.

## 2015-01-12 ENCOUNTER — Other Ambulatory Visit (HOSPITAL_COMMUNITY): Payer: Self-pay | Admitting: Oncology

## 2015-01-12 ENCOUNTER — Telehealth (HOSPITAL_COMMUNITY): Payer: Self-pay | Admitting: Oncology

## 2015-01-12 DIAGNOSIS — C7800 Secondary malignant neoplasm of unspecified lung: Secondary | ICD-10-CM

## 2015-01-12 DIAGNOSIS — C20 Malignant neoplasm of rectum: Secondary | ICD-10-CM

## 2015-01-12 MED ORDER — HYDROMORPHONE HCL 2 MG PO TABS: ORAL_TABLET | ORAL | Status: DC

## 2015-01-12 MED ORDER — MORPHINE SULFATE ER 30 MG PO TBCR: 30.0000 mg | EXTENDED_RELEASE_TABLET | Freq: Three times a day (TID) | ORAL | Status: DC

## 2015-01-12 NOTE — Telephone Encounter (Signed)
Dilaudid refilled.  #100.  This is a 1 month supply.  If he runs out early, it will not be refilled.  I have increased his long acting pain medication.  New Rx written for long-acting. Dr. [name] will be discussing this information with him in detail on Wednesday.  Shavaun Osterloh 01/12/2015 5:03 PM

## 2015-01-13 NOTE — Telephone Encounter (Signed)
Message left on voicemail regarding prescription refills.

## 2015-01-14 ENCOUNTER — Encounter (HOSPITAL_COMMUNITY): Payer: Self-pay | Admitting: Hematology & Oncology

## 2015-01-14 ENCOUNTER — Encounter (HOSPITAL_BASED_OUTPATIENT_CLINIC_OR_DEPARTMENT_OTHER): Payer: Medicaid Other

## 2015-01-14 ENCOUNTER — Encounter (HOSPITAL_BASED_OUTPATIENT_CLINIC_OR_DEPARTMENT_OTHER): Payer: Medicaid Other | Admitting: Hematology & Oncology

## 2015-01-14 VITALS — BP 130/80 | HR 80 | Temp 98.6°F | Resp 18

## 2015-01-14 DIAGNOSIS — Z5111 Encounter for antineoplastic chemotherapy: Secondary | ICD-10-CM

## 2015-01-14 DIAGNOSIS — R918 Other nonspecific abnormal finding of lung field: Secondary | ICD-10-CM | POA: Diagnosis not present

## 2015-01-14 DIAGNOSIS — Z5112 Encounter for antineoplastic immunotherapy: Secondary | ICD-10-CM

## 2015-01-14 DIAGNOSIS — C20 Malignant neoplasm of rectum: Secondary | ICD-10-CM | POA: Diagnosis not present

## 2015-01-14 DIAGNOSIS — Z720 Tobacco use: Secondary | ICD-10-CM | POA: Diagnosis not present

## 2015-01-14 DIAGNOSIS — C7801 Secondary malignant neoplasm of right lung: Secondary | ICD-10-CM

## 2015-01-14 DIAGNOSIS — F32A Depression, unspecified: Secondary | ICD-10-CM

## 2015-01-14 DIAGNOSIS — F329 Major depressive disorder, single episode, unspecified: Secondary | ICD-10-CM

## 2015-01-14 DIAGNOSIS — C7800 Secondary malignant neoplasm of unspecified lung: Principal | ICD-10-CM

## 2015-01-14 DIAGNOSIS — G893 Neoplasm related pain (acute) (chronic): Secondary | ICD-10-CM | POA: Diagnosis not present

## 2015-01-14 LAB — CBC WITH DIFFERENTIAL/PLATELET
Basophils Absolute: 0 10*3/uL (ref 0.0–0.1)
Basophils Relative: 0 %
Eosinophils Absolute: 0.2 10*3/uL (ref 0.0–0.7)
Eosinophils Relative: 2 %
HEMATOCRIT: 44.4 % (ref 39.0–52.0)
HEMOGLOBIN: 15.3 g/dL (ref 13.0–17.0)
LYMPHS ABS: 0.7 10*3/uL (ref 0.7–4.0)
LYMPHS PCT: 8 %
MCH: 32.2 pg (ref 26.0–34.0)
MCHC: 34.5 g/dL (ref 30.0–36.0)
MCV: 93.5 fL (ref 78.0–100.0)
Monocytes Absolute: 1 10*3/uL (ref 0.1–1.0)
Monocytes Relative: 12 %
NEUTROS ABS: 6.4 10*3/uL (ref 1.7–7.7)
NEUTROS PCT: 78 %
Platelets: 244 10*3/uL (ref 150–400)
RBC: 4.75 MIL/uL (ref 4.22–5.81)
RDW: 17.6 % — ABNORMAL HIGH (ref 11.5–15.5)
WBC: 8.3 10*3/uL (ref 4.0–10.5)

## 2015-01-14 LAB — URINALYSIS, DIPSTICK ONLY
BILIRUBIN URINE: NEGATIVE
Glucose, UA: NEGATIVE mg/dL
Ketones, ur: NEGATIVE mg/dL
Nitrite: NEGATIVE
Protein, ur: NEGATIVE mg/dL
SPECIFIC GRAVITY, URINE: 1.02 (ref 1.005–1.030)
UROBILINOGEN UA: 1 mg/dL (ref 0.0–1.0)
pH: 6 (ref 5.0–8.0)

## 2015-01-14 LAB — COMPREHENSIVE METABOLIC PANEL
ALK PHOS: 98 U/L (ref 38–126)
ALT: 12 U/L — AB (ref 17–63)
AST: 14 U/L — ABNORMAL LOW (ref 15–41)
Albumin: 3.7 g/dL (ref 3.5–5.0)
Anion gap: 9 (ref 5–15)
BUN: 8 mg/dL (ref 6–20)
CALCIUM: 8.9 mg/dL (ref 8.9–10.3)
CO2: 24 mmol/L (ref 22–32)
CREATININE: 0.75 mg/dL (ref 0.61–1.24)
Chloride: 104 mmol/L (ref 101–111)
Glucose, Bld: 116 mg/dL — ABNORMAL HIGH (ref 65–99)
Potassium: 3.9 mmol/L (ref 3.5–5.1)
SODIUM: 137 mmol/L (ref 135–145)
Total Bilirubin: 0.4 mg/dL (ref 0.3–1.2)
Total Protein: 7.1 g/dL (ref 6.5–8.1)

## 2015-01-14 MED ORDER — CITALOPRAM HYDROBROMIDE 40 MG PO TABS: ORAL_TABLET | ORAL | Status: DC

## 2015-01-14 MED ORDER — SODIUM CHLORIDE 0.9 % IV SOLN
5.0000 mg/kg | Freq: Once | INTRAVENOUS | Status: AC
  Administered 2015-01-14: 475 mg via INTRAVENOUS
  Filled 2015-01-14: qty 19

## 2015-01-14 MED ORDER — FLUOROURACIL CHEMO INJECTION 5 GM/100ML
2400.0000 mg/m2 | INTRAVENOUS | Status: DC
  Administered 2015-01-14: 5000 mg via INTRAVENOUS
  Filled 2015-01-14: qty 100

## 2015-01-14 MED ORDER — SODIUM CHLORIDE 0.9 % IV SOLN
Freq: Once | INTRAVENOUS | Status: AC
  Administered 2015-01-14: 12:00:00 via INTRAVENOUS
  Filled 2015-01-14: qty 4

## 2015-01-14 MED ORDER — DEXTROSE 5 % IV SOLN
Freq: Once | INTRAVENOUS | Status: AC
  Administered 2015-01-14: 11:00:00 via INTRAVENOUS

## 2015-01-14 MED ORDER — SODIUM CHLORIDE 0.9 % IJ SOLN: 10.0000 mL | INTRAMUSCULAR | Status: DC | PRN

## 2015-01-14 MED ORDER — LEUCOVORIN CALCIUM INJECTION 350 MG
400.0000 mg/m2 | Freq: Once | INTRAVENOUS | Status: AC
  Administered 2015-01-14: 836 mg via INTRAVENOUS
  Filled 2015-01-14: qty 41.8

## 2015-01-14 MED ORDER — OXALIPLATIN CHEMO INJECTION 100 MG/20ML
85.0000 mg/m2 | Freq: Once | INTRAVENOUS | Status: AC
  Administered 2015-01-14: 180 mg via INTRAVENOUS
  Filled 2015-01-14: qty 36

## 2015-01-14 NOTE — Progress Notes (Signed)
Patient tolerated infusion well.  VSS.   

## 2015-01-14 NOTE — Patient Instructions (Signed)
..  [facility] Discharge Instructions for Patients Receiving Chemotherapy  Today you received the following chemotherapy agents:  Oxaliplatin  To help prevent nausea and vomiting after your treatment, we encourage you to take your nausea medication Zofran '8mg'$  every 8 hours as needed for nausea or vomiting Begin taking it as needed and take it as often as prescribed for the next 48 hours.   If you develop nausea and vomiting, or diarrhea that is not controlled by your medication, call the clinic.  The clinic phone number is [phone]. Office hours are Monday-Friday 8:30am-5:00pm.  BELOW ARE SYMPTOMS THAT SHOULD BE REPORTED IMMEDIATELY:  *FEVER GREATER THAN 101.0 F  *CHILLS WITH OR WITHOUT FEVER  NAUSEA AND VOMITING THAT IS NOT CONTROLLED WITH YOUR NAUSEA MEDICATION  *UNUSUAL SHORTNESS OF BREATH  *UNUSUAL BRUISING OR BLEEDING  TENDERNESS IN MOUTH AND THROAT WITH OR WITHOUT PRESENCE OF ULCERS  *URINARY PROBLEMS  *BOWEL PROBLEMS  UNUSUAL RASH Items with * indicate a potential emergency and should be followed up as soon as possible. If you have an emergency after office hours please contact your primary care physician or go to the nearest emergency department.  Please call the clinic during office hours if you have any questions or concerns.   You may also contact the Patient Navigator at [phone] should you have any questions or need assistance in obtaining follow up care. _____________________________________________________________________ Have you asked about our STAR program?    STAR stands for Survivorship Training and Rehabilitation, and this is a nationally recognized cancer care program that focuses on survivorship and rehabilitation.  Cancer and cancer treatments may cause problems, such as, pain, making you feel tired and keeping you from doing the things that you need or want to do. Cancer rehabilitation can help. Our goal is to reduce these  troubling effects and help you have the best quality of life possible.  You may receive a survey from a nurse that asks questions about your current state of health.  Based on the survey results, all eligible patients will be referred to the [facility] program for an evaluation so we can better serve you! A frequently asked questions sheet is available upon request.

## 2015-01-14 NOTE — Progress Notes (Signed)
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    09/25/2014 Imaging CT abd/pelvis- Indeterminate nodules within the bilateral lung bases with dominant approximately 1.7 cm centrally necrotic nodule within the right lower lobe...   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   10/03/2014 Tumor Marker CEA NOT ELEVATED   10/09/2014 Imaging CT Chest- Bilateral pulmonary nodules of varying size are most consistent with pulmonary metastasis. There are approximately 6 lesions in each lung.   10/17/2014 Pathology Results Rectum, biopsy - INVASIVE ADENOCARCINOMA   10/29/2014 Pathology Results Diagnosis Lung, needle/core biopsy(ies), right lower lobe - METASTATIC ADENOCARCINOMA   11/04/2014 - 12/18/2014 Radiation Therapy Dr. Lisbeth Renshaw   11/05/2014 -  Chemotherapy FOLFOX + Avastin    HISTORY OF PRESENTING ILLNESS:  Andrew Kline 49 y.o. male is here because of stage IV rectal cancer.  He is here alone today. He goes by [name]. He is doing a little better in regards to pain.The patient is here alone today.   He says that he's been cooking, eating "like a horse," but not sleeping very well recently. He says he's been sleeping restlessly, due to stress. Andrew Kline says he's trying to keep his head above water at the current time, and that he's "sick and tired of being sick and tired." He says he's "not really depressed," and wants some "good answers."  He says he's been "putting it in his mind" that his cancer  isn't terminal, even though it is. He does not seem to want to face the reality of his disease being incurable, or the reality that he's not going to be done with chemotherapy "ever."  When asked what answers he's looking for, he wants to know the exact location of his tumor. He says he just wants the pain to stop. He says his whole bottom end feels like it's bruised, and that he can't sit or drive very comfortably. He confirms that his pain has improved since things started out, but still expresses a lot of distress about it. He says that taking a bowel movement, passing gas, and sneezing all cause him intense pain.  He also expresses a lot of concern about becoming dependent on pain meds or otherwise "relying" on them. He just wants to be able to sit down without pain or even cough without experiencing pain.  He denies neuropathy, nausea, vomiting, or diarrhea.  MEDICAL HISTORY:  Past Medical History  Diagnosis Date  . Rectal pain   . Arthritis   . Renal cell cancer     2012.   [name] Rectal cancer     SURGICAL HISTORY: Past Surgical History  Procedure Laterality Date  . Hypospadias correction    . Laparoscopic partial nephrectomy  2012    right side  . Knee surgery Left     arthroscopy  . Flexible sigmoidoscopy N/A 09/30/2014    Procedure: FLEXIBLE SIGMOIDOSCOPY;  Surgeon: Danie Binder, MD;  Location: [facility];  Service: Endoscopy;  Laterality: N/A;  . Esophageal biopsy N/A 09/30/2014    Procedure: BIOPSY;  Surgeon: Danie Binder, MD;  Location: [facility];  Service: Endoscopy;  Laterality: N/A;  Anal Canal  . Flexible sigmoidoscopy N/A 10/17/2014    Procedure: FLEXIBLE SIGMOIDOSCOPY;  Surgeon: Danie Binder, MD;  Location: [facility] SUITE;  Service: Endoscopy;  Laterality: N/A;  1325    SOCIAL HISTORY: Social History   Social History  . Marital Status: Divorced    Spouse Name: N/A  . Number of Children: 2  . Years of Education: N/A   Occupational History  . retail    Social  History Main Topics  . Smoking status: Current Every Day Smoker -- 1.00 packs/day for 30 years    Types: Cigarettes  . Smokeless tobacco: Not on file     Comment: a little over a pack daily  . Alcohol Use: 0.0 oz/week    0 Standard drinks or equivalent per week     Comment: Occ  . Drug Use: No  . Sexual Activity: Yes    Birth Control/ Protection: None   Other Topics Concern  . Not on file   Social History Narrative  He is from [place], [place]. Moved here in March. Smoker, 1 ppd. Was 1.5 ppd. ETOH, none. 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 He is divorced. Lives with fiance. 2 children. 28 and 92 yo. No grandchildren.  FAMILY HISTORY: Family History  Problem Relation Age of Onset  . Lung cancer Maternal Grandfather   . Colon cancer Maternal Uncle     age 57  . Diabetes Other   . Healthy Mother    has no family status information on file.   Mother living, 11 yo. Still working as a Audiological scientist. Father living, 75-70 yo. 1 brother, 1 sister, 1 step sister. Maternal uncle has colon cancer. He is married to a GI nurse. So he has people to go to with questions and is informed on what is happening. History of diabetes in the family. No history of heart disease in the family.  ALLERGIES:  has No Known Allergies.  MEDICATIONS:  Current Outpatient Prescriptions  Medication Sig Dispense Refill  . amitriptyline (ELAVIL) 50 MG tablet Take 1 tablet (50 mg total) by mouth at bedtime. 30 tablet 2  . Bevacizumab (AVASTIN IV) Inject into the vein every 14 (fourteen) days. To start November 19, 2014    . citalopram (CELEXA) 40 MG tablet TAKE 1/2 TABLET BY MOUTH FOR 5 DAYS  THEN ONE TABLET DAILY THEREAFTER. 30 tablet 1  . dextrose 5 % SOLN 1,000 mL with fluorouracil 5 GM/100ML SOLN Inject into the vein every 14 (fourteen) days. To start November 05, 2014. To infuse over 46 hours via pump.    [name]  docusate sodium (COLACE) 100 MG capsule Take 1 capsule (100 mg total) by mouth every 12 (twelve) hours. 60 capsule 0  . HYDROmorphone (DILAUDID) 2 MG tablet Take 3 tablets every 4 hours as needed for pain. 100 tablet 0  . leucovorin 50 MG injection Inject into the vein every 14 (fourteen) days. To start November 05, 2014    . lidocaine-hydrocortisone (ANAMANTLE) 3-1 % KIT Place 1 application rectally 2 (two) times daily. For two weeks, alternate with Rectiv. 28 each 1  . lidocaine-prilocaine (EMLA) cream Apply a quarter size amount to port site 1 hour prior to chemo. Do not rub in. Cover with plastic wrap. 30 g 3  . morphine (MS CONTIN) 30 MG 12  hr tablet Take 1 tablet (30 mg total) by mouth every 8 (eight) hours. 90 tablet 0  . nitroGLYCERIN (NITROGLYN) 2 % ointment Apply 0.5 inches topically daily. Anal area    . ondansetron (ZOFRAN) 8 MG tablet Take 1 tablet (8 mg total) by mouth every 8 (eight) hours as needed for nausea or vomiting. 60 tablet 1  . OXALIPLATIN IV Inject into the vein every 14 (fourteen) days. To start November 05, 2014    . prochlorperazine (COMPAZINE) 10 MG tablet Take 1 tablet (10 mg total) by mouth every 6 (six) hours as needed for nausea or vomiting. 60 tablet 0   No current facility-administered medications for this visit.   Facility-Administered Medications Ordered in Other Visits  Medication Dose Route Frequency Provider Last Rate Last Dose  . 0.9 %  sodium chloride infusion   Intravenous Continuous Patrici Ranks, MD      . fluorouracil (ADRUCIL) 5,000 mg in sodium chloride 0.9 % 150 mL chemo infusion  2,400 mg/m2 (Treatment Plan Actual) Intravenous 1 day or 1 dose Baird Cancer, PA-C   5,000 mg at 01/14/15 1608  . sodium chloride 0.9 % injection 10 mL  10 mL Intracatheter PRN Patrici Ranks, MD        Review of Systems  Constitutional: Positive for malaise/fatigue.  Respiratory: Negative for sputum production.   Gastrointestinal: Negative for blood in stool,  diarrhea Patient is experiencing abdominal pain due to radiation therapy.  Genitourinary: Positive for rectum pain. Psychiatric/Behavioral: The patient is not nervous/anxious.    14 point ROS was done and is otherwise as detailed above or in HPI   PHYSICAL EXAMINATION:  ECOG PERFORMANCE STATUS: 1 - Symptomatic but completely ambulatory  There were no vitals filed for this visit. There were no vitals filed for this visit.  Physical Exam  Constitutional: He is oriented to person, place, and time and well-developed, well-nourished, and in no distress.  Sitting upright. He is watching the television. HENT:  Head: Normocephalic and atraumatic.  Nose: Nose normal.  Mouth/Throat: Oropharynx is clear and moist. No oropharyngeal exudate.  Dentures on top.  Eyes: Conjunctivae and EOM are normal. Pupils are equal, round, and reactive to light. Right eye exhibits no discharge. Left eye exhibits no discharge. No scleral icterus.  Neck: Normal range of motion. Neck supple. No tracheal deviation present. No thyromegaly present.  Cardiovascular: Normal rate, regular rhythm and normal heart sounds.  Exam reveals no gallop and no friction rub.   No murmur heard. Pulmonary/Chest: Effort normal and breath sounds normal. He has no wheezes. He has no rales.  Abdominal: Soft. Bowel sounds are normal. He exhibits no distension and no mass. There is no tenderness. There is no rebound and no guarding.  Genitourinary:   Musculoskeletal: Normal range of motion. He exhibits no edema.  Lymphadenopathy:    He has no cervical adenopathy.  Neurological: He is alert and oriented to person, place, and time. He has normal reflexes. No cranial nerve deficit. Gait normal. Coordination normal.  Skin: Skin is warm and dry. No rash noted.  Psychiatric: Mood, memory, affect and judgment normal.  Nursing note and vitals reviewed.   LABORATORY DATA:  I have reviewed the data as listed Lab Results  Component Value Date    WBC 8.3 01/14/2015   HGB 15.3 01/14/2015   HCT 44.4 01/14/2015   MCV 93.5 01/14/2015   PLT 244 01/14/2015   CMP     Component Value Date/Time   NA 137 01/14/2015  1050   K 3.9 01/14/2015 1050   CL 104 01/14/2015 1050   CO2 24 01/14/2015 1050   GLUCOSE 116* 01/14/2015 1050   BUN 8 01/14/2015 1050   CREATININE 0.75 01/14/2015 1050   CALCIUM 8.9 01/14/2015 1050   PROT 7.1 01/14/2015 1050   ALBUMIN 3.7 01/14/2015 1050   AST 14* 01/14/2015 1050   ALT 12* 01/14/2015 1050   ALKPHOS 98 01/14/2015 1050   BILITOT 0.4 01/14/2015 1050   GFRNONAA >60 01/14/2015 1050   GFRAA >60 01/14/2015 1050    ASSESSMENT & PLAN:  STAGE IV Rectal vs. Anal carcinoma, adenocarcinoma Pulmonary metastases Rectal pain interfering with function Good PS History of partial nephrectomy 2012 at [facility] in [place] FOLFOX/AVASTIN XRT for palliation   Realistically he is doing better in regards to his pain. I advised the patient however he still feels that his pain is intolerable that I will send him back to Dr. Arnoldo Morale for consideration of palliative ostomy. Patient is aware that he will require an ostomy and he says at this point he does not care.  I advised him that we would stop his chemotherapy prior to initiation of his surgery. We will have to discontinue his Avastin at least 4 weeks prior.  I again reviewed stage IV colon cancer and prognosis. I advised him that we need to discuss his prognosis with the goal being that he gain acceptance of his prognosis. We discussed trying to live well with his disease but at the same time be accepting of his disease.  He will return in 2 weeks with his next chemotherapy and labs.  We are going to obtain CT imaging prior to his next follow-up for restaging.  I believe he needs to be taking an antidepressant to alleviate some of his anxious thoughts. I have called him in a prescription for Celexa. He admits he does not take the amitriptyline  regularly anymore because it makes him "feel too weird."  This note was electronically signed.    This document serves as a record of services personally performed by Ancil Linsey, MD. It was created on her behalf by Toni Amend, a trained medical scribe. The creation of this record is based on the scribe's personal observations and the provider's statements to them. This document has been checked and approved by the attending provider.  I have reviewed the above documentation for accuracy and completeness, and I agree with the above.  Kelby Fam. [name], MD

## 2015-01-14 NOTE — Patient Instructions (Signed)
[place] at [facility] Discharge Instructions  RECOMMENDATIONS MADE BY THE CONSULTANT AND ANY TEST RESULTS WILL BE SENT TO YOUR REFERRING PHYSICIAN.  Chemo today as scheduled. We will refer back to Dr.Jenkins. Appointment September 20th at 900am. Return as scheduled.  Thank you for choosing [place] at [facility] to provide your oncology and hematology care.  To afford each patient quality time with our provider, please arrive at least 15 minutes before your scheduled appointment time.    You need to re-schedule your appointment should you arrive 10 or more minutes late.  We strive to give you quality time with our providers, and arriving late affects you and other patients whose appointments are after yours.  Also, if you no show three or more times for appointments you may be dismissed from the clinic at the providers discretion.     Again, thank you for choosing [facility].  Our hope is that these requests will decrease the amount of time that you wait before being seen by our physicians.       _____________________________________________________________  Should you have questions after your visit to [facility], please contact our office at [phone] between the hours of 8:30 a.m. and 4:30 p.m.  Voicemails left after 4:30 p.m. will not be returned until the following business day.  For prescription refill requests, have your pharmacy contact our office.

## 2015-01-16 ENCOUNTER — Encounter (HOSPITAL_BASED_OUTPATIENT_CLINIC_OR_DEPARTMENT_OTHER): Payer: Medicaid Other

## 2015-01-16 ENCOUNTER — Encounter (HOSPITAL_COMMUNITY): Payer: Self-pay

## 2015-01-16 VITALS — BP 144/78 | HR 94 | Temp 98.4°F | Resp 18

## 2015-01-16 DIAGNOSIS — Z452 Encounter for adjustment and management of vascular access device: Secondary | ICD-10-CM

## 2015-01-16 DIAGNOSIS — C7800 Secondary malignant neoplasm of unspecified lung: Principal | ICD-10-CM

## 2015-01-16 DIAGNOSIS — C7801 Secondary malignant neoplasm of right lung: Secondary | ICD-10-CM | POA: Diagnosis not present

## 2015-01-16 DIAGNOSIS — C20 Malignant neoplasm of rectum: Secondary | ICD-10-CM | POA: Diagnosis not present

## 2015-01-16 MED ORDER — HEPARIN SOD (PORK) LOCK FLUSH 100 UNIT/ML IV SOLN
INTRAVENOUS | Status: AC
  Filled 2015-01-16: qty 5

## 2015-01-16 MED ORDER — HEPARIN SOD (PORK) LOCK FLUSH 100 UNIT/ML IV SOLN
500.0000 [IU] | Freq: Once | INTRAVENOUS | Status: AC | PRN
  Administered 2015-01-16: 500 [IU]

## 2015-01-16 MED ORDER — SODIUM CHLORIDE 0.9 % IJ SOLN
10.0000 mL | INTRAMUSCULAR | Status: DC | PRN
  Administered 2015-01-16: 10 mL
  Filled 2015-01-16: qty 10

## 2015-01-16 NOTE — Progress Notes (Signed)
Andrew Kline presented for Portacath de-access and flush.  Proper placement of portacath confirmed by CXR.  Portacath located right chest wall accessed with  H 20 needle.  Good blood return present. Portacath flushed with 16m NS and 500U/562mHeparin and needle removed intact.  Procedure tolerated well and without incident.  Pump removed

## 2015-01-16 NOTE — Patient Instructions (Signed)
[place] at [facility] Discharge Instructions  RECOMMENDATIONS MADE BY THE CONSULTANT AND ANY TEST RESULTS WILL BE SENT TO YOUR REFERRING PHYSICIAN.  Pump removed, port flushed Return as scheduled Please call the clinic if you have any questions or concerns  Thank you for choosing [place] at [facility] to provide your oncology and hematology care.  To afford each patient quality time with our provider, please arrive at least 15 minutes before your scheduled appointment time.    You need to re-schedule your appointment should you arrive 10 or more minutes late.  We strive to give you quality time with our providers, and arriving late affects you and other patients whose appointments are after yours.  Also, if you no show three or more times for appointments you may be dismissed from the clinic at the providers discretion.     Again, thank you for choosing [facility].  Our hope is that these requests will decrease the amount of time that you wait before being seen by our physicians.       _____________________________________________________________  Should you have questions after your visit to [facility], please contact our office at [phone] between the hours of 8:30 a.m. and 4:30 p.m.  Voicemails left after 4:30 p.m. will not be returned until the following business day.  For prescription refill requests, have your pharmacy contact our office.

## 2015-01-18 ENCOUNTER — Encounter (HOSPITAL_COMMUNITY): Payer: Self-pay | Admitting: Hematology & Oncology

## 2015-01-21 NOTE — H&P (Signed)
  NTS SOAP Note  Vital Signs:  Vitals as of: 1/97/5883: Systolic 254: Diastolic 85: Heart Rate 93: Temp 98.33F: Height 22f 6in: Weight 199Lbs 0 Ounces: Pain Level 2: BMI 32.12  BMI : 32.12 kg/m2  Subjective: This 49year old male presents for of rectal cancer.  Has been undergoing chemotherapy as well as pelvic radiation therapy.  Just had last XRT today.  Referred for preop discussion about possible further surgery.  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  Is having diarrhea, rectal pain, and obstructive symptoms.  Review of Symptoms:  Constitutional:unremarkable   Head:unremarkable Eyes:unremarkable   Nose/Mouth/Throat:unremarkable Cardiovascular:  unremarkable Respiratory:unremarkable Gastrointestinabdominal pain Genitourinary:unremarkable   Musculoskeletal:unremarkable Skin:unremarkable Hematolgic/Lymphatic:unremarkable   Allergic/Immunologic:unremarkable   Past Medical History:  Reviewed  Past Medical History  Surgical History: partial nephrectomy, knee surgery Medical Problems: hypospadius, renal cancer, rectal cancer, depression, lung mets Allergies: nkda Medications: morphine, dilaudid, colace, avastin, leucovorin, oxaliplatin, elavil   Social History:Reviewed  Social History  Preferred Language: English Race:  White Ethnicity: Not Hispanic / Latino Age: [age]year Marital Status:  S Alcohol: socially   Smoking Status: Light tobacco smoker reviewed on 12/18/2014 Started Date:  Packs per week:  Functional Status reviewed on 12/18/2014 ------------------------------------------------ Bathing: Normal Cooking: Normal Dressing: Normal Driving: Normal Eating: Normal Managing Meds: Normal Oral Care: Normal Shopping: Normal Toileting: Normal Transferring: Normal Walking: Normal Cognitive Status reviewed on 12/18/2014 ------------------------------------------------ Attention: Normal Decision Making: Normal Language: Normal Memory:  Normal Motor: Normal Perception: Normal Problem Solving: Normal Visual and Spatial: Normal   Family History:Reviewed  Family Health History Family History is Unknown    Objective Information: General:Well appearing, well nourished in no distress. Heart:RRR, no murmur or gallop.  Normal S1, S2.  No S3, S4.  Lungs:  CTA bilaterally, no wheezes, rhonchi, rales.  Breathing unlabored. Abdomen:Soft, NT/ND, no HSM, no masses. 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  Patient Education:Alternative treatments to surgery were discussed with patient (and family).  Risks and benefits  of procedure were fully explained to the patient (and family) who gave informed consent. Patient/family questions were addressed.

## 2015-01-23 ENCOUNTER — Other Ambulatory Visit (HOSPITAL_COMMUNITY): Payer: Self-pay | Admitting: Hematology & Oncology

## 2015-01-23 MED ORDER — FENTANYL 25 MCG/HR TD PT72: 25.0000 ug | MEDICATED_PATCH | TRANSDERMAL | Status: DC

## 2015-01-23 NOTE — Telephone Encounter (Signed)
Spoke with sister and states "Andrew Kline's pain is really bad, it's worse than it's been in the past.  Is taking the dilaudid as directed and is also taking the MS Contin and the pain is not being relieved."  Rates discomfort at 4 when resting but if he gets up and moves around rates it at 10 +.  Stated they had talked with surgeon and surgeon said he would talk with Dr. [name] about possibility of use of fentanyl patch.  Surgery is not going to be done until 10/12.

## 2015-01-23 NOTE — Telephone Encounter (Signed)
Message left for patient to call office

## 2015-01-23 NOTE — Telephone Encounter (Signed)
Have patieint/family come by to pick up prescription. Advise that he is to stop MS contin with the patch. It will replace it. He can still take dilaudid as needed. Dr.P

## 2015-01-26 NOTE — Telephone Encounter (Signed)
Spoke with patient and instructed that prescription for fentanyl patch has been written and someone will need to come pick it up.  Also instructed that he must stop the MS contin if he's going to use the Fentanyl patch.  Verbalized understanding of instructions.

## 2015-01-28 ENCOUNTER — Encounter (HOSPITAL_BASED_OUTPATIENT_CLINIC_OR_DEPARTMENT_OTHER): Payer: Medicaid Other | Admitting: Hematology & Oncology

## 2015-01-28 ENCOUNTER — Encounter (HOSPITAL_COMMUNITY): Payer: Self-pay

## 2015-01-28 ENCOUNTER — Encounter (HOSPITAL_BASED_OUTPATIENT_CLINIC_OR_DEPARTMENT_OTHER): Payer: Medicaid Other

## 2015-01-28 VITALS — BP 147/92 | HR 100 | Temp 98.5°F | Resp 18 | Wt 184.0 lb

## 2015-01-28 DIAGNOSIS — Z5111 Encounter for antineoplastic chemotherapy: Secondary | ICD-10-CM

## 2015-01-28 DIAGNOSIS — C7801 Secondary malignant neoplasm of right lung: Secondary | ICD-10-CM | POA: Diagnosis not present

## 2015-01-28 DIAGNOSIS — C20 Malignant neoplasm of rectum: Secondary | ICD-10-CM

## 2015-01-28 DIAGNOSIS — C7800 Secondary malignant neoplasm of unspecified lung: Principal | ICD-10-CM

## 2015-01-28 DIAGNOSIS — R918 Other nonspecific abnormal finding of lung field: Secondary | ICD-10-CM | POA: Diagnosis not present

## 2015-01-28 LAB — CBC WITH DIFFERENTIAL/PLATELET
BASOS PCT: 0 %
Basophils Absolute: 0 10*3/uL (ref 0.0–0.1)
EOS ABS: 0.2 10*3/uL (ref 0.0–0.7)
EOS PCT: 3 %
HCT: 43 % (ref 39.0–52.0)
Hemoglobin: 15 g/dL (ref 13.0–17.0)
Lymphocytes Relative: 11 %
Lymphs Abs: 0.7 10*3/uL (ref 0.7–4.0)
MCH: 32.3 pg (ref 26.0–34.0)
MCHC: 34.9 g/dL (ref 30.0–36.0)
MCV: 92.7 fL (ref 78.0–100.0)
MONO ABS: 0.9 10*3/uL (ref 0.1–1.0)
MONOS PCT: 14 %
NEUTROS PCT: 72 %
Neutro Abs: 4.9 10*3/uL (ref 1.7–7.7)
PLATELETS: 248 10*3/uL (ref 150–400)
RBC: 4.64 MIL/uL (ref 4.22–5.81)
RDW: 17.5 % — AB (ref 11.5–15.5)
WBC: 6.8 10*3/uL (ref 4.0–10.5)

## 2015-01-28 LAB — COMPREHENSIVE METABOLIC PANEL
ALT: 11 U/L — ABNORMAL LOW (ref 17–63)
ANION GAP: 9 (ref 5–15)
AST: 18 U/L (ref 15–41)
Albumin: 3.6 g/dL (ref 3.5–5.0)
Alkaline Phosphatase: 101 U/L (ref 38–126)
BUN: 8 mg/dL (ref 6–20)
CO2: 24 mmol/L (ref 22–32)
Calcium: 8.7 mg/dL — ABNORMAL LOW (ref 8.9–10.3)
Chloride: 102 mmol/L (ref 101–111)
Creatinine, Ser: 0.88 mg/dL (ref 0.61–1.24)
GFR calc non Af Amer: >60 mL/min (ref >60)
GLUCOSE: 185 mg/dL — AB (ref 65–99)
POTASSIUM: 3.7 mmol/L (ref 3.5–5.1)
SODIUM: 135 mmol/L (ref 135–145)
Total Bilirubin: 0.6 mg/dL (ref 0.3–1.2)
Total Protein: 7.2 g/dL (ref 6.5–8.1)

## 2015-01-28 MED ORDER — SODIUM CHLORIDE 0.9 % IJ SOLN: 10.0000 mL | INTRAMUSCULAR | Status: DC | PRN

## 2015-01-28 MED ORDER — SODIUM CHLORIDE 0.9 % IV SOLN
2400.0000 mg/m2 | INTRAVENOUS | Status: DC
  Administered 2015-01-28: 5000 mg via INTRAVENOUS
  Filled 2015-01-28: qty 100

## 2015-01-28 MED ORDER — LEUCOVORIN CALCIUM INJECTION 350 MG
400.0000 mg/m2 | Freq: Once | INTRAVENOUS | Status: AC
  Administered 2015-01-28: 836 mg via INTRAVENOUS
  Filled 2015-01-28: qty 41.8

## 2015-01-28 MED ORDER — SODIUM CHLORIDE 0.9 % IV SOLN
Freq: Once | INTRAVENOUS | Status: AC
  Administered 2015-01-28: 11:00:00 via INTRAVENOUS
  Filled 2015-01-28: qty 4

## 2015-01-28 MED ORDER — DEXTROSE 5 % IV SOLN
Freq: Once | INTRAVENOUS | Status: AC
  Administered 2015-01-28: 11:00:00 via INTRAVENOUS

## 2015-01-28 MED ORDER — HYDROMORPHONE HCL 2 MG PO TABS: ORAL_TABLET | ORAL | Status: DC

## 2015-01-28 MED ORDER — DEXTROSE 5 % IV SOLN
85.0000 mg/m2 | Freq: Once | INTRAVENOUS | Status: AC
  Administered 2015-01-28: 180 mg via INTRAVENOUS
  Filled 2015-01-28: qty 36

## 2015-01-28 NOTE — Patient Instructions (Signed)
[facility] Discharge Instructions for Patients Receiving Chemotherapy  Today you received the following chemotherapy agents:  Oxaliplatin, leucovorin, and 45f.  To help prevent nausea and vomiting after your treatment, we encourage you to take your nausea medication as prescribed.  If you develop nausea and vomiting, or diarrhea that is not controlled by your medication, call the clinic.  The clinic phone number is [phone] Office hours are Monday-Friday 8:30am-5:00pm.  BELOW ARE SYMPTOMS THAT SHOULD BE REPORTED IMMEDIATELY:  *FEVER GREATER THAN 101.0 F  *CHILLS WITH OR WITHOUT FEVER  NAUSEA AND VOMITING THAT IS NOT CONTROLLED WITH YOUR NAUSEA MEDICATION  *UNUSUAL SHORTNESS OF BREATH  *UNUSUAL BRUISING OR BLEEDING  TENDERNESS IN MOUTH AND THROAT WITH OR WITHOUT PRESENCE OF ULCERS  *URINARY PROBLEMS  *BOWEL PROBLEMS  UNUSUAL RASH Items with * indicate a potential emergency and should be followed up as soon as possible. If you have an emergency after office hours please contact your primary care physician or go to the nearest emergency department.  Please call the clinic during office hours if you have any questions or concerns.   You may also contact the Patient Navigator at [phone] you have any questions or need assistance in obtaining follow up care. _____________________________________________________________________ Have you asked about our STAR program?    STAR stands for Survivorship Training and Rehabilitation, and this is a nationally recognized cancer care program that focuses on survivorship and rehabilitation.  Cancer and cancer treatments may cause problems, such as, pain, making you feel tired and keeping you from doing the things that you need or want to do. Cancer rehabilitation can help. Our goal is to reduce these troubling effects and help you have the best quality of life possible.  You may receive a survey from a nurse that  asks questions about your current state of health.  Based on the survey results, all eligible patients will be [facility] for an evaluation so we can better serve you! A frequently asked questions sheet is available upon request.

## 2015-01-28 NOTE — Progress Notes (Signed)
Tolerated tx w/o adverse reaction. VSS.  A&Ox4; in no distress.

## 2015-01-28 NOTE — Progress Notes (Signed)
[place] at [facility] Progress Note  Patient Care Team: No Pcp Per Patient as PCP - General (General Practice) Danie Binder, MD as Consulting Physician (Gastroenterology)  CHIEF COMPLAINTS/PURPOSE OF CONSULTATION:  Adenocarcinoma of the Rectum Stage IV CEA NOT elevated    Rectal cancer metastasized to lung   09/20/2014 Imaging CT pelvis- Questionable asymmetric wall thickening or mass along the right wall of the anus/ rectum.    09/25/2014 Imaging CT abd/pelvis- Indeterminate nodules within the bilateral lung bases with dominant approximately 1.7 cm centrally necrotic nodule within the right lower lobe...   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   10/03/2014 Tumor Marker CEA NOT ELEVATED   10/09/2014 Imaging CT Chest- Bilateral pulmonary nodules of varying size are most consistent with pulmonary metastasis. There are approximately 6 lesions in each lung.   10/17/2014 Pathology Results Rectum, biopsy - INVASIVE ADENOCARCINOMA   10/29/2014 Pathology Results Diagnosis Lung, needle/core biopsy(ies), right lower lobe - METASTATIC ADENOCARCINOMA   11/04/2014 - 12/18/2014 Radiation Therapy Dr. Lisbeth Renshaw   11/05/2014 -  Chemotherapy FOLFOX + Avastin    HISTORY OF PRESENTING ILLNESS:  Andrew Kline 49 y.o. male is here because of stage IV rectal cancer.  He is here alone today. He goes by [name]. He is doing a little better in regards to pain.The patient is here alone today.   Mr. Andrew Kline is here alone. He received his treatment today and has been sleeping most of the day.  He says he "has no control down there anymore." He says he can never tell what's gonna happen down there, whether it's gonna be a "fart" or something else. He says that in spite of the fact that his stool is usually liquid, he has to strain to get anything out.  His surgery is scheduled for  October 12th. He seems prepared for his surgery and prepared for the ostomy bag. He says he is mostly concerned about putting an end to his pain and bowel control. He says he's ready for it, and that he's not scared. He's just tired of never knowing when he's going to go to the bathroom. He says everything he eats comes straight out, and he can't even walk around his own house without knowing when he might go to the bathroom.  He is getting his marriage license on Monday and plans on being married to his fiance before his surgery takes place. He also is working on his living will. He seems comforted by the thought of getting his affairs in order.  He denies neuropathy. Avastin was held today in anticipation for surgery in 2 weeks.   MEDICAL HISTORY:  Past Medical History  Diagnosis Date  . Rectal pain   . Arthritis   . Renal cell cancer     2012.   [name] Rectal cancer     SURGICAL HISTORY: Past Surgical History  Procedure Laterality Date  . Hypospadias correction    . Laparoscopic partial nephrectomy  2012    right side  . Knee surgery Left     arthroscopy  . Flexible sigmoidoscopy N/A 09/30/2014    Procedure: FLEXIBLE SIGMOIDOSCOPY;  Surgeon: Danie Binder, MD;  Location: [facility];  Service: Endoscopy;  Laterality: N/A;  . Esophageal biopsy N/A 09/30/2014    Procedure: BIOPSY;  Surgeon: Danie Binder, MD;  Location: [facility];  Service: Endoscopy;  Laterality: N/A;  Anal Canal  . Flexible sigmoidoscopy N/A 10/17/2014    Procedure: FLEXIBLE SIGMOIDOSCOPY;  Surgeon: Danie Binder, MD;  Location: [facility] END[facility] SUITE;  Service: Endoscopy;  Laterality: N/A;  1325    SOCIAL HISTORY: Social History   Social History  . Marital Status: Divorced    Spouse Name: N/A  . Number of Children: 2  . Years of Education: N/A   Occupational History  . retail    Social History Main Topics  . Smoking status: Current Every Day Smoker -- 1.00 packs/day for 30 years    Types: Cigarettes  . Smokeless tobacco:  Not on file     Comment: a little over a pack daily  . Alcohol Use: 0.0 oz/week    0 Standard drinks or equivalent per week     Comment: Occ  . Drug Use: No  . Sexual Activity: Yes    Birth Control/ Protection: None   Other Topics Concern  . Not on file   Social History Narrative  He is from [place], [place]. Moved here in March. Smoker, 1 ppd. Was 1.5 ppd. ETOH, none. 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 He is divorced. Lives with fiance. 2 children. 15 and 53 yo. No grandchildren.  FAMILY HISTORY: Family History  Problem Relation Age of Onset  . Lung cancer Maternal Grandfather   . Colon cancer Maternal Uncle     age 36  . Diabetes Other   . Healthy Mother    has no family status information on file.   Mother living, 25 yo. Still working as a Audiological scientist. Father living, 42-70 yo. 1 brother, 1 sister, 1 step sister. Maternal uncle has colon cancer. He is married to a GI nurse. So he has people to go to with questions and is informed on what is happening. History of diabetes in the family. No history of heart disease in the family.  ALLERGIES:  has No Known Allergies.  MEDICATIONS:  Current Outpatient Prescriptions  Medication Sig Dispense Refill  . amitriptyline (ELAVIL) 50 MG tablet Take 1 tablet (50 mg total) by mouth at bedtime. 30 tablet 2  . Bevacizumab (AVASTIN IV) Inject into the vein every 14 (fourteen) days. To start November 19, 2014    . citalopram (CELEXA) 40 MG tablet TAKE 1/2 TABLET BY MOUTH FOR 5 DAYS  THEN ONE TABLET DAILY THEREAFTER. 30 tablet 1  . dextrose 5 % SOLN 1,000 mL with fluorouracil 5 GM/100ML SOLN Inject into the vein every 14 (fourteen) days. To start November 05, 2014. To infuse over 46 hours via pump.    [name] docusate sodium (COLACE) 100 MG capsule Take 1 capsule (100 mg total) by mouth every 12 (twelve) hours. 60 capsule 0  . fentaNYL (DURAGESIC -  DOSED MCG/HR) 25 MCG/HR patch Place 1 patch (25 mcg total) onto the skin every 3 (three) days. 10 patch 0  . HYDROmorphone (DILAUDID) 2 MG tablet Take 3 tablets every 4 hours as needed for pain. 100 tablet 0  . leucovorin 50 MG injection Inject into the vein every 14 (fourteen) days. To start November 05, 2014    . lidocaine-hydrocortisone (ANAMANTLE) 3-1 % KIT Place 1 application rectally 2 (two) times daily. For two weeks, alternate with Rectiv. 28 each 1  . lidocaine-prilocaine (EMLA) cream Apply a quarter size amount to port site 1 hour prior to chemo. Do not rub in. Cover with plastic wrap. [place]  g 3  . morphine (MS CONTIN) 30 MG 12 hr tablet Take 1 tablet (30 mg total) by mouth every 8 (eight) hours. 90 tablet 0  . nitroGLYCERIN (NITROGLYN) 2 % ointment Apply 0.5 inches topically daily. Anal area    . ondansetron (ZOFRAN) 8 MG tablet Take 1 tablet (8 mg total) by mouth every 8 (eight) hours as needed for nausea or vomiting. 60 tablet 1  . OXALIPLATIN IV Inject into the vein every 14 (fourteen) days. To start November 05, 2014    . prochlorperazine (COMPAZINE) 10 MG tablet Take 1 tablet (10 mg total) by mouth every 6 (six) hours as needed for nausea or vomiting. 60 tablet 0   No current facility-administered medications for this visit.   Facility-Administered Medications Ordered in Other Visits  Medication Dose Route Frequency Provider Last Rate Last Dose  . 0.9 %  sodium chloride infusion   Intravenous Continuous Patrici Ranks, MD        Review of Systems  Constitutional: Positive for malaise/fatigue.  Respiratory: Negative for sputum production.   Gastrointestinal: Negative for blood in stool, diarrhea Patient is experiencing abdominal pain due to radiation therapy.  Genitourinary: Positive for rectum pain. Psychiatric/Behavioral: The patient is not nervous/anxious.    14 point ROS was done and is otherwise as detailed above or in HPI   PHYSICAL EXAMINATION:  ECOG PERFORMANCE STATUS: 1 -  Symptomatic but completely ambulatory  There were no vitals filed for this visit. There were no vitals filed for this visit.  Physical Exam  Constitutional: He is oriented to person, place, and time and well-developed, well-nourished, and in no distress.  Sitting upright. HENT:  Head: Normocephalic and atraumatic.  Nose: Nose normal.  Mouth/Throat: Oropharynx is clear and moist. No oropharyngeal exudate.  Dentures on top.  Eyes: Conjunctivae and EOM are normal. Pupils are equal, round, and reactive to light. Right eye exhibits no discharge. Left eye exhibits no discharge. No scleral icterus.  Neck: Normal range of motion. Neck supple. No tracheal deviation present. No thyromegaly present.  Cardiovascular: Normal rate, regular rhythm and normal heart sounds.  Exam reveals no gallop and no friction rub.   No murmur heard. Pulmonary/Chest: Effort normal and breath sounds normal. He has no wheezes. He has no rales.  Abdominal: Soft. Bowel sounds are normal. He exhibits no distension and no mass. There is no tenderness. There is no rebound and no guarding.  Genitourinary:   Musculoskeletal: Normal range of motion. He exhibits no edema.  Lymphadenopathy:    He has no cervical adenopathy.  Neurological: He is alert and oriented to person, place, and time. He has normal reflexes. No cranial nerve deficit. Gait normal. Coordination normal.  Skin: Skin is warm and dry. No rash noted.  Psychiatric: Mood, memory, affect and judgment normal.  Nursing note and vitals reviewed.   LABORATORY DATA:  I have reviewed the data as listed Lab Results  Component Value Date   WBC 6.8 01/28/2015   HGB 15.0 01/28/2015   HCT 43.0 01/28/2015   MCV 92.7 01/28/2015   PLT 248 01/28/2015   CMP     Component Value Date/Time   NA 135 01/28/2015 0954   K 3.7 01/28/2015 0954   CL 102 01/28/2015 0954   CO2 24 01/28/2015 0954   GLUCOSE 185* 01/28/2015 0954   BUN 8 01/28/2015 0954   CREATININE 0.88  01/28/2015 0954   CALCIUM 8.7* 01/28/2015 0954   PROT 7.2 01/28/2015 0954   ALBUMIN 3.6 01/28/2015 0954  AST 18 01/28/2015 0954   ALT 11* 01/28/2015 0954   ALKPHOS 101 01/28/2015 0954   BILITOT 0.6 01/28/2015 0954   GFRNONAA >60 01/28/2015 0954   GFRAA >60 01/28/2015 0954    ASSESSMENT & PLAN:  STAGE IV Rectal vs. Anal carcinoma, adenocarcinoma Pulmonary metastases Rectal pain interfering with function Good PS History of partial nephrectomy 2012 at [facility] in [place] FOLFOX/AVASTIN XRT for palliation   He has surgery planned in about 2 weeks. Hopefully his pain will be better controlled and certainly his bowel habits should no longer be an issue.  Overall he has achieved better pain control with XRT/chemotherapy.  He will need restaging studies which we will do after his surgery. CEA is not helpful as it has never been elevated. Clinically he is improved.  He did not get Avastin today. We will also hold off on his next Avastin treatment until he is appropriately healed from surgery. I discussed this with him today. I spoke with him today about the Fentanyl patch, advising that he should start with using just one patch. If one isn't enough, he may double the patch. He will let us know.  I will see him back the week after surgery, the 19th or the 20th, and we will regroup and go from there. I anticipate ordering scans first before re-initiation of therapy.  We addressed his living will. I continue to discuss smoking cessation.  This note was electronically signed.    This document serves as a record of services personally performed by Ancil Linsey, MD. It was created on her behalf by Toni Amend, a trained medical scribe. The creation of this record is based on the scribe's personal observations and the provider's statements to them. This document has been checked and approved by the attending provider.  I have reviewed the above documentation for  accuracy and completeness, and I agree with the above.  Kelby Fam. [name], MD

## 2015-01-28 NOTE — Progress Notes (Signed)
Labs drawn

## 2015-01-29 LAB — CEA: CEA: 3.4 ng/mL (ref 0.0–4.7)

## 2015-01-30 ENCOUNTER — Encounter (HOSPITAL_BASED_OUTPATIENT_CLINIC_OR_DEPARTMENT_OTHER): Payer: Medicaid Other

## 2015-01-30 ENCOUNTER — Encounter (HOSPITAL_COMMUNITY): Payer: Self-pay

## 2015-01-30 VITALS — BP 147/97 | HR 95 | Temp 97.9°F | Resp 20

## 2015-01-30 DIAGNOSIS — C7800 Secondary malignant neoplasm of unspecified lung: Principal | ICD-10-CM

## 2015-01-30 DIAGNOSIS — Z452 Encounter for adjustment and management of vascular access device: Secondary | ICD-10-CM

## 2015-01-30 DIAGNOSIS — C20 Malignant neoplasm of rectum: Secondary | ICD-10-CM

## 2015-01-30 DIAGNOSIS — C7801 Secondary malignant neoplasm of right lung: Secondary | ICD-10-CM

## 2015-01-30 MED ORDER — HEPARIN SOD (PORK) LOCK FLUSH 100 UNIT/ML IV SOLN
500.0000 [IU] | Freq: Once | INTRAVENOUS | Status: AC | PRN
  Administered 2015-01-30: 500 [IU]

## 2015-01-30 MED ORDER — HEPARIN SOD (PORK) LOCK FLUSH 100 UNIT/ML IV SOLN
INTRAVENOUS | Status: AC
  Filled 2015-01-30: qty 5

## 2015-01-30 MED ORDER — SODIUM CHLORIDE 0.9 % IJ SOLN
10.0000 mL | INTRAMUSCULAR | Status: DC | PRN
  Administered 2015-01-30: 10 mL
  Filled 2015-01-30: qty 10

## 2015-01-30 NOTE — Progress Notes (Signed)
1420:  Andrew Kline presents to have home infusion pump d/c'd and for port-a-cath deaccess with flush.   Portacath located right chest wall   No blood return and flushes w/o difficulty, no swelling, no c/o pain at site.  Portacath flushed with NS and 500U/12m Heparin, and needle removed intact.  Procedure tolerated well and without incident.

## 2015-01-30 NOTE — Patient Instructions (Signed)
[place] at [facility] Discharge Instructions  RECOMMENDATIONS MADE BY THE CONSULTANT AND ANY TEST RESULTS WILL BE SENT TO YOUR REFERRING PHYSICIAN.  Pump removal and port flush today. Return as scheduled for next chemotherapy.   Thank you for choosing [place] at [facility] to provide your oncology and hematology care.  To afford each patient quality time with our provider, please arrive at least 15 minutes before your scheduled appointment time.    You need to re-schedule your appointment should you arrive 10 or more minutes late.  We strive to give you quality time with our providers, and arriving late affects you and other patients whose appointments are after yours.  Also, if you no show three or more times for appointments you may be dismissed from the clinic at the providers discretion.     Again, thank you for choosing [facility].  Our hope is that these requests will decrease the amount of time that you wait before being seen by our physicians.       _____________________________________________________________  Should you have questions after your visit to [facility], please contact our office at [phone] between the hours of 8:30 a.m. and 4:30 p.m.  Voicemails left after 4:30 p.m. will not be returned until the following business day.  For prescription refill requests, have your pharmacy contact our office.

## 2015-01-31 ENCOUNTER — Encounter (HOSPITAL_COMMUNITY): Payer: Self-pay | Admitting: Hematology & Oncology

## 2015-02-03 NOTE — Patient Instructions (Signed)
Andrew Kline  02/03/2015     '@PREFPERIOPPHARMACY'$ @   Your procedure is scheduled on 10/12/.  Report to [name] at 8:15 A.M.  Call this number if you have problems the morning of surgery:  [phone]   Remember:  Do not eat food or drink liquids after midnight.  Take these medicines the morning of surgery with A SIP OF WATER Celexa, Fentanyl patch, (dilaudid, or MS Contin),  (Zofran or Compazine)   Do not wear jewelry, make-up or nail polish.  Do not wear lotions, powders, or perfumes.  You may wear deodorant.  Do not shave 48 hours prior to surgery.  Men may shave face and neck.  Do not bring valuables to the hospital.  [facility] is not responsible for any belongings or valuables.  Contacts, dentures or bridgework may not be worn into surgery.  Leave your suitcase in the car.  After surgery it may be brought to your room.  For patients admitted to the hospital, discharge time will be determined by your treatment team.  Patients discharged the day of surgery will not be allowed to drive home.    Please read over the following fact sheets that you were given. Surgical Site Infection Prevention and Anesthesia Post-op Instructions     PATIENT INSTRUCTIONS POST-ANESTHESIA  IMMEDIATELY FOLLOWING SURGERY:  Do not drive or operate machinery for the first twenty four hours after surgery.  Do not make any important decisions for twenty four hours after surgery or while taking narcotic pain medications or sedatives.  If you develop intractable nausea and vomiting or a severe headache please notify your doctor immediately.  FOLLOW-UP:  Please make an appointment with your surgeon as instructed. You do not need to follow up with anesthesia unless specifically instructed to do so.  WOUND CARE INSTRUCTIONS (if applicable):  Keep a dry clean dressing on the anesthesia/puncture wound site if there is drainage.  Once the wound has quit draining you may leave it open to air.  Generally  you should leave the bandage intact for twenty four hours unless there is drainage.  If the epidural site drains for more than 36-48 hours please call the anesthesia department.  QUESTIONS?:  Please feel free to call your physician or the hospital operator if you have any questions, and they will be happy to assist you.      Colostomy Surgery A colostomy surgery is a procedure that redirects a section of the large intestine (colon) to an opening in the abdomen. This opening is called a stoma or ostomy. A bag is attached to the stoma on the outside of the body. This bag collects waste, since the waste can no longer travel through the rest of the colon. Where the stoma is located and what it looks like depends on the type of colostomy performed. A colostomy may be temporary or permanent. The hospital stay after this procedure is typically 3 to 7 days.  LET YOUR CAREGIVER KNOW ABOUT:  Allergies to food or medicine.  Medicines taken, including vitamins, health supplements, herbs, eyedrops, over-the-counter medicines, and creams.  Use of steroids (by mouth or creams).  Previous problems with anesthetics or numbing medicines.  History of bleeding problems or blood clots.  Previous surgery.  Other health problems, including diabetes and kidney problems.  Possibility of pregnancy, if this applies. RISKS AND COMPLICATIONS General surgical complications may include:  Reaction to anesthesia.  Damage to surrounding nerves, tissues, or structures.  Infection.  Blood clot.  Bleeding.  Scarring.  Pain that lasts longer than 3 months. Specific risks for colostomy, while rare, may include:  Intestinal blockage.  Skin irritation.  Wound opening.  Narrowing or collapsing of the stoma.  Hernia. BEFORE THE PROCEDURE It is important to follow your surgeon's instructions prior to your procedure to avoid complications. Steps before your procedure may include:  A physical exam, blood and  urine tests, stool test, X-rays, and other procedures.  Chemotherapy or radiation therapy.  A review of the procedure, the anesthesia being used, and what to expect after the procedure. You may meet with an ostomy advisor. You may be asked to:  Stop taking certain medicines for several days prior to your procedure such as blood thinners (including aspirin).  Take certain medicines, such as antibiotics or stool softeners.  Follow a special diet for several days prior to the procedure and to avoid eating and drinking after midnight the night before the procedure. This will help you to avoid complications from the anesthesia.  Take an antibacterial shower the night before, or the morning of, the procedure.  Quit smoking. Smoking increases the chances of an infection or a healing problem after your procedure. Arrange for someone to drive you home after surgery. You should also arrange to have someone help you with activities while you recover. PROCEDURE There are several types of colostomy procedures. The 2 main procedure types are loop colostomy or end colostomy. During a loop colostomy, the surgeon pulls 2 ends of the intestine out toward the abdomen, using 2 openings. During an end colostomy, the surgeon pulls 1 end of the intestine out toward the abdomen, through 1 opening. You will be given medicine that makes you sleep (general anesthetic).The procedure may be done as open surgery, with a large cut (incision), or as laparoscopic surgery, with several small incisions.  AFTER THE PROCEDURE  You will be given pain medicine.  You may be able to suck on ice. You may begin drinking clear fluids the next day and begin a normal diet after 2 days, or as directed by your surgeon.  Your stoma will be covered with bandages or a pouch.  Initial drainage from the stoma will be liquid.  The stoma may be dark-colored, swollen, and bruised until it has more time to heal. Document Released: 09/08/2010  Document Revised: 07/11/2011 Document Reviewed: 09/08/2010 [facility] [employer] Patient Information 2015 [place], [place]. This information is not intended to replace advice given to you by your health care provider. Make sure you discuss any questions you have with your health care provider.

## 2015-02-05 ENCOUNTER — Encounter (HOSPITAL_COMMUNITY): Payer: Self-pay

## 2015-02-05 ENCOUNTER — Encounter (HOSPITAL_COMMUNITY)
Admission: RE | Admit: 2015-02-05 | Discharge: 2015-02-05 | Disposition: A | Discharge status: Home / Self Care | Payer: Medicaid Other | Source: Ambulatory Visit | Attending: General Surgery | Admitting: General Surgery

## 2015-02-05 DIAGNOSIS — Z01818 Encounter for other preprocedural examination: Secondary | ICD-10-CM | POA: Diagnosis present

## 2015-02-05 DIAGNOSIS — C20 Malignant neoplasm of rectum: Secondary | ICD-10-CM | POA: Insufficient documentation

## 2015-02-05 HISTORY — DX: Major depressive disorder, single episode, unspecified: F32.9

## 2015-02-05 HISTORY — DX: Reserved for inherently not codable concepts without codable children: IMO0001

## 2015-02-05 HISTORY — DX: Anxiety disorder, unspecified: F41.9

## 2015-02-05 HISTORY — DX: Depression, unspecified: F32.A

## 2015-02-05 LAB — BASIC METABOLIC PANEL
ANION GAP: 8 (ref 5–15)
BUN: 5 mg/dL — ABNORMAL LOW (ref 6–20)
CHLORIDE: 99 mmol/L — AB (ref 101–111)
CO2: 24 mmol/L (ref 22–32)
Calcium: 8.4 mg/dL — ABNORMAL LOW (ref 8.9–10.3)
Creatinine, Ser: 0.88 mg/dL (ref 0.61–1.24)
GFR calc non Af Amer: >60 mL/min (ref >60)
Glucose, Bld: 124 mg/dL — ABNORMAL HIGH (ref 65–99)
Potassium: 4.3 mmol/L (ref 3.5–5.1)
Sodium: 131 mmol/L — ABNORMAL LOW (ref 135–145)

## 2015-02-05 LAB — CBC WITH DIFFERENTIAL/PLATELET
Basophils Absolute: 0 10*3/uL (ref 0.0–0.1)
Basophils Relative: 0 %
Eosinophils Absolute: 0.1 10*3/uL (ref 0.0–0.7)
Eosinophils Relative: 1 %
HCT: 40.2 % (ref 39.0–52.0)
HEMOGLOBIN: 13.9 g/dL (ref 13.0–17.0)
LYMPHS ABS: 0.7 10*3/uL (ref 0.7–4.0)
LYMPHS PCT: 8 %
MCH: 32.4 pg (ref 26.0–34.0)
MCHC: 34.6 g/dL (ref 30.0–36.0)
MCV: 93.7 fL (ref 78.0–100.0)
MONOS PCT: 12 %
Monocytes Absolute: 1.1 10*3/uL — ABNORMAL HIGH (ref 0.1–1.0)
NEUTROS ABS: 7.2 10*3/uL (ref 1.7–7.7)
NEUTROS PCT: 79 %
Platelets: 249 10*3/uL (ref 150–400)
RBC: 4.29 MIL/uL (ref 4.22–5.81)
RDW: 17.4 % — ABNORMAL HIGH (ref 11.5–15.5)
WBC: 9.2 10*3/uL (ref 4.0–10.5)

## 2015-02-05 LAB — TYPE AND SCREEN
ABO/RH(D): O POS
Antibody Screen: NEGATIVE

## 2015-02-05 NOTE — Pre-Procedure Instructions (Signed)
Patient given information to sign up for my chart at home. 

## 2015-02-09 ENCOUNTER — Telehealth (HOSPITAL_COMMUNITY): Payer: Self-pay | Admitting: *Deleted

## 2015-02-09 ENCOUNTER — Other Ambulatory Visit (HOSPITAL_COMMUNITY): Payer: Self-pay | Admitting: Oncology

## 2015-02-09 DIAGNOSIS — J069 Acute upper respiratory infection, unspecified: Secondary | ICD-10-CM

## 2015-02-09 MED ORDER — AMOXICILLIN-POT CLAVULANATE 875-125 MG PO TABS: 1.0000 | ORAL_TABLET | Freq: Two times a day (BID) | ORAL | Status: DC

## 2015-02-09 NOTE — Telephone Encounter (Signed)
Fiance notified and verbalized understanding of instructions.

## 2015-02-09 NOTE — Telephone Encounter (Signed)
-----   Message from Louis Meckel sent at 02/09/2015  1:49 PM EDT ----- Regarding: Please call Patient needs to talk to a nurse.  He did not state why.

## 2015-02-09 NOTE — Telephone Encounter (Signed)
Augmentin x 5 days escribed to [facility]  Kassady Laboy 02/09/2015 2:55 PM

## 2015-02-09 NOTE — Telephone Encounter (Signed)
Notified fiance that a prescription had been e-scribed to [facility].

## 2015-02-11 ENCOUNTER — Encounter (HOSPITAL_COMMUNITY): Payer: Self-pay | Admitting: *Deleted

## 2015-02-11 ENCOUNTER — Encounter (HOSPITAL_COMMUNITY)
Admission: RE | Disposition: A | Discharge status: Home / Self Care | Payer: Self-pay | Source: Ambulatory Visit | Attending: General Surgery

## 2015-02-11 ENCOUNTER — Inpatient Hospital Stay (HOSPITAL_COMMUNITY): Payer: Medicaid Other | Admitting: Anesthesiology

## 2015-02-11 ENCOUNTER — Inpatient Hospital Stay (HOSPITAL_COMMUNITY)
Admission: RE | Admit: 2015-02-11 | Discharge: 2015-02-14 | DRG: 330 | Disposition: A | Discharge status: Home / Self Care | Payer: Medicaid Other | Source: Ambulatory Visit | Attending: General Surgery | Admitting: General Surgery

## 2015-02-11 DIAGNOSIS — C20 Malignant neoplasm of rectum: Secondary | ICD-10-CM | POA: Diagnosis present

## 2015-02-11 DIAGNOSIS — C7800 Secondary malignant neoplasm of unspecified lung: Secondary | ICD-10-CM | POA: Diagnosis present

## 2015-02-11 DIAGNOSIS — Z9221 Personal history of antineoplastic chemotherapy: Secondary | ICD-10-CM | POA: Diagnosis not present

## 2015-02-11 DIAGNOSIS — Z23 Encounter for immunization: Secondary | ICD-10-CM

## 2015-02-11 DIAGNOSIS — F17200 Nicotine dependence, unspecified, uncomplicated: Secondary | ICD-10-CM | POA: Diagnosis present

## 2015-02-11 DIAGNOSIS — Z923 Personal history of irradiation: Secondary | ICD-10-CM

## 2015-02-11 DIAGNOSIS — F329 Major depressive disorder, single episode, unspecified: Secondary | ICD-10-CM | POA: Diagnosis present

## 2015-02-11 DIAGNOSIS — E440 Moderate protein-calorie malnutrition: Secondary | ICD-10-CM | POA: Insufficient documentation

## 2015-02-11 DIAGNOSIS — Z6829 Body mass index (BMI) 29.0-29.9, adult: Secondary | ICD-10-CM | POA: Diagnosis not present

## 2015-02-11 HISTORY — PX: COLOSTOMY: SHX63

## 2015-02-11 SURGERY — CREATION, COLOSTOMY
Anesthesia: General

## 2015-02-11 MED ORDER — KETOROLAC TROMETHAMINE 30 MG/ML IJ SOLN
30.0000 mg | Freq: Once | INTRAMUSCULAR | Status: AC
  Administered 2015-02-11: 30 mg via INTRAVENOUS
  Filled 2015-02-11: qty 1

## 2015-02-11 MED ORDER — DEXTROSE 5 % IV SOLN
2.0000 g | INTRAVENOUS | Status: DC | PRN
  Administered 2015-02-11: 2 g via INTRAVENOUS

## 2015-02-11 MED ORDER — MIDAZOLAM HCL 2 MG/2ML IJ SOLN
INTRAMUSCULAR | Status: AC
  Filled 2015-02-11: qty 4

## 2015-02-11 MED ORDER — MIDAZOLAM HCL 2 MG/2ML IJ SOLN
1.0000 mg | INTRAMUSCULAR | Status: DC | PRN
  Administered 2015-02-11 (×2): 2 mg via INTRAVENOUS
  Filled 2015-02-11: qty 2

## 2015-02-11 MED ORDER — GLYCOPYRROLATE 0.2 MG/ML IJ SOLN
INTRAMUSCULAR | Status: AC
  Filled 2015-02-11: qty 1

## 2015-02-11 MED ORDER — GLYCOPYRROLATE 0.2 MG/ML IJ SOLN
INTRAMUSCULAR | Status: DC | PRN
  Administered 2015-02-11: 0.6 mg via INTRAVENOUS

## 2015-02-11 MED ORDER — ACETAMINOPHEN 325 MG PO TABS: 650.0000 mg | ORAL_TABLET | Freq: Four times a day (QID) | ORAL | Status: DC | PRN

## 2015-02-11 MED ORDER — HYDROMORPHONE HCL 1 MG/ML IJ SOLN
2.0000 mg | INTRAMUSCULAR | Status: DC | PRN
  Administered 2015-02-11 – 2015-02-14 (×16): 2 mg via INTRAVENOUS
  Filled 2015-02-11 (×16): qty 2

## 2015-02-11 MED ORDER — PROPOFOL 10 MG/ML IV BOLUS
INTRAVENOUS | Status: AC
  Filled 2015-02-11: qty 20

## 2015-02-11 MED ORDER — MIDAZOLAM HCL 2 MG/2ML IJ SOLN
INTRAMUSCULAR | Status: AC
  Filled 2015-02-11: qty 2

## 2015-02-11 MED ORDER — SUCCINYLCHOLINE CHLORIDE 20 MG/ML IJ SOLN
INTRAMUSCULAR | Status: AC
  Filled 2015-02-11: qty 1

## 2015-02-11 MED ORDER — ACETAMINOPHEN 650 MG RE SUPP: 650.0000 mg | Freq: Four times a day (QID) | RECTAL | Status: DC | PRN

## 2015-02-11 MED ORDER — LACTATED RINGERS IV SOLN
INTRAVENOUS | Status: DC
  Administered 2015-02-11 – 2015-02-14 (×6): via INTRAVENOUS

## 2015-02-11 MED ORDER — NEOSTIGMINE METHYLSULFATE 10 MG/10ML IV SOLN
INTRAVENOUS | Status: AC
  Filled 2015-02-11: qty 1

## 2015-02-11 MED ORDER — ONDANSETRON HCL 4 MG/2ML IJ SOLN
INTRAMUSCULAR | Status: AC
  Filled 2015-02-11: qty 2

## 2015-02-11 MED ORDER — LIDOCAINE HCL (CARDIAC) 10 MG/ML IV SOLN
INTRAVENOUS | Status: DC | PRN
  Administered 2015-02-11: 50 mg via INTRAVENOUS

## 2015-02-11 MED ORDER — GLYCOPYRROLATE 0.2 MG/ML IJ SOLN
0.2000 mg | Freq: Once | INTRAMUSCULAR | Status: AC
  Administered 2015-02-11: 0.2 mg via INTRAVENOUS

## 2015-02-11 MED ORDER — ALBUTEROL SULFATE (2.5 MG/3ML) 0.083% IN NEBU: 2.5000 mg | INHALATION_SOLUTION | RESPIRATORY_TRACT | Status: DC | PRN

## 2015-02-11 MED ORDER — ROCURONIUM BROMIDE 100 MG/10ML IV SOLN
INTRAVENOUS | Status: DC | PRN
  Administered 2015-02-11: 40 mg via INTRAVENOUS

## 2015-02-11 MED ORDER — ENOXAPARIN SODIUM 40 MG/0.4ML ~~LOC~~ SOLN
40.0000 mg | Freq: Once | SUBCUTANEOUS | Status: AC
  Administered 2015-02-11: 40 mg via SUBCUTANEOUS
  Filled 2015-02-11: qty 0.4

## 2015-02-11 MED ORDER — DIPHENHYDRAMINE HCL 50 MG/ML IJ SOLN: 12.5000 mg | Freq: Four times a day (QID) | INTRAMUSCULAR | Status: DC | PRN

## 2015-02-11 MED ORDER — ONDANSETRON HCL 4 MG/2ML IJ SOLN: 4.0000 mg | Freq: Four times a day (QID) | INTRAMUSCULAR | Status: DC | PRN

## 2015-02-11 MED ORDER — FENTANYL CITRATE (PF) 100 MCG/2ML IJ SOLN
25.0000 ug | INTRAMUSCULAR | Status: DC | PRN
  Administered 2015-02-11 (×4): 50 ug via INTRAVENOUS
  Filled 2015-02-11 (×2): qty 2

## 2015-02-11 MED ORDER — FENTANYL 25 MCG/HR TD PT72
25.0000 ug | MEDICATED_PATCH | TRANSDERMAL | Status: DC
  Administered 2015-02-11: 25 ug via TRANSDERMAL
  Filled 2015-02-11: qty 1

## 2015-02-11 MED ORDER — POVIDONE-IODINE 10 % EX OINT
TOPICAL_OINTMENT | CUTANEOUS | Status: AC
  Filled 2015-02-11: qty 1

## 2015-02-11 MED ORDER — FENTANYL CITRATE (PF) 100 MCG/2ML IJ SOLN
INTRAMUSCULAR | Status: DC | PRN
  Administered 2015-02-11 (×5): 50 ug via INTRAVENOUS

## 2015-02-11 MED ORDER — FENTANYL CITRATE (PF) 250 MCG/5ML IJ SOLN
INTRAMUSCULAR | Status: AC
  Filled 2015-02-11: qty 25

## 2015-02-11 MED ORDER — ONDANSETRON HCL 4 MG/2ML IJ SOLN: 4.0000 mg | Freq: Once | INTRAMUSCULAR | Status: DC | PRN

## 2015-02-11 MED ORDER — CEFOTETAN DISODIUM-DEXTROSE 2-2.08 GM-% IV SOLR
2.0000 g | INTRAVENOUS | Status: DC
  Filled 2015-02-11: qty 50

## 2015-02-11 MED ORDER — LORAZEPAM 2 MG/ML IJ SOLN
2.0000 mg | INTRAMUSCULAR | Status: DC | PRN
  Administered 2015-02-13: 2 mg via INTRAVENOUS
  Filled 2015-02-11: qty 1

## 2015-02-11 MED ORDER — BUPIVACAINE LIPOSOME 1.3 % IJ SUSP
INTRAMUSCULAR | Status: DC | PRN
  Administered 2015-02-11: 20 mL

## 2015-02-11 MED ORDER — HYDROMORPHONE HCL 2 MG PO TABS
4.0000 mg | ORAL_TABLET | ORAL | Status: DC | PRN
  Administered 2015-02-11 – 2015-02-14 (×3): 4 mg via ORAL
  Filled 2015-02-11 (×3): qty 2

## 2015-02-11 MED ORDER — ENOXAPARIN SODIUM 40 MG/0.4ML ~~LOC~~ SOLN
40.0000 mg | SUBCUTANEOUS | Status: DC
  Administered 2015-02-12 – 2015-02-14 (×3): 40 mg via SUBCUTANEOUS
  Filled 2015-02-11 (×3): qty 0.4

## 2015-02-11 MED ORDER — AMITRIPTYLINE HCL 25 MG PO TABS
50.0000 mg | ORAL_TABLET | Freq: Every day | ORAL | Status: DC
  Administered 2015-02-11 – 2015-02-13 (×3): 50 mg via ORAL
  Filled 2015-02-11 (×2): qty 1
  Filled 2015-02-11 (×6): qty 2

## 2015-02-11 MED ORDER — ONDANSETRON HCL 4 MG/2ML IJ SOLN
INTRAMUSCULAR | Status: DC | PRN
  Administered 2015-02-11: 4 mg via INTRAVENOUS

## 2015-02-11 MED ORDER — AMOXICILLIN-POT CLAVULANATE 875-125 MG PO TABS
1.0000 | ORAL_TABLET | Freq: Two times a day (BID) | ORAL | Status: DC
  Administered 2015-02-11 – 2015-02-14 (×6): 1 via ORAL
  Filled 2015-02-11 (×6): qty 1

## 2015-02-11 MED ORDER — CITALOPRAM HYDROBROMIDE 20 MG PO TABS
40.0000 mg | ORAL_TABLET | Freq: Every day | ORAL | Status: DC
  Administered 2015-02-12 – 2015-02-14 (×3): 40 mg via ORAL
  Filled 2015-02-11: qty 2
  Filled 2015-02-11: qty 1
  Filled 2015-02-11 (×4): qty 2
  Filled 2015-02-11: qty 1
  Filled 2015-02-11: qty 2

## 2015-02-11 MED ORDER — GLYCOPYRROLATE 0.2 MG/ML IJ SOLN
INTRAMUSCULAR | Status: AC
  Filled 2015-02-11: qty 3

## 2015-02-11 MED ORDER — MIDAZOLAM HCL 5 MG/5ML IJ SOLN
INTRAMUSCULAR | Status: DC | PRN
  Administered 2015-02-11: 2 mg via INTRAVENOUS

## 2015-02-11 MED ORDER — DOCUSATE SODIUM 100 MG PO CAPS
100.0000 mg | ORAL_CAPSULE | Freq: Two times a day (BID) | ORAL | Status: DC
  Administered 2015-02-11 – 2015-02-14 (×6): 100 mg via ORAL
  Filled 2015-02-11 (×6): qty 1

## 2015-02-11 MED ORDER — LIDOCAINE-HYDROCORTISONE ACE 3-1 % RE KIT: 1.0000 "application " | PACK | Freq: Two times a day (BID) | RECTAL | Status: DC

## 2015-02-11 MED ORDER — ENSURE ENLIVE PO LIQD
237.0000 mL | Freq: Two times a day (BID) | ORAL | Status: DC
  Administered 2015-02-11 – 2015-02-13 (×4): 237 mL via ORAL

## 2015-02-11 MED ORDER — ONDANSETRON 4 MG PO TBDP: 4.0000 mg | ORAL_TABLET | Freq: Four times a day (QID) | ORAL | Status: DC | PRN

## 2015-02-11 MED ORDER — LACTATED RINGERS IV SOLN
INTRAVENOUS | Status: DC
  Administered 2015-02-11 (×3): via INTRAVENOUS

## 2015-02-11 MED ORDER — ROCURONIUM BROMIDE 50 MG/5ML IV SOLN
INTRAVENOUS | Status: AC
  Filled 2015-02-11: qty 1

## 2015-02-11 MED ORDER — CHLORHEXIDINE GLUCONATE 4 % EX LIQD: 1.0000 "application " | Freq: Once | CUTANEOUS | Status: DC

## 2015-02-11 MED ORDER — NEOSTIGMINE METHYLSULFATE 10 MG/10ML IV SOLN
INTRAVENOUS | Status: DC | PRN
  Administered 2015-02-11: 4 mg via INTRAVENOUS

## 2015-02-11 MED ORDER — METHOCARBAMOL 500 MG PO TABS: 500.0000 mg | ORAL_TABLET | Freq: Four times a day (QID) | ORAL | Status: DC | PRN

## 2015-02-11 MED ORDER — HYDROMORPHONE HCL 1 MG/ML IJ SOLN
0.5000 mg | INTRAMUSCULAR | Status: DC | PRN
  Administered 2015-02-11 (×2): 0.5 mg via INTRAVENOUS

## 2015-02-11 MED ORDER — PROPOFOL 10 MG/ML IV BOLUS
INTRAVENOUS | Status: DC | PRN
  Administered 2015-02-11: 140 mg via INTRAVENOUS

## 2015-02-11 MED ORDER — NITROGLYCERIN 2 % TD OINT
0.5000 [in_us] | TOPICAL_OINTMENT | Freq: Every day | TRANSDERMAL | Status: DC
  Administered 2015-02-14: 11:00:00 via TOPICAL
  Filled 2015-02-11 (×3): qty 1

## 2015-02-11 MED ORDER — SODIUM CHLORIDE 0.9 % IR SOLN
Status: DC | PRN
  Administered 2015-02-11: 2000 mL

## 2015-02-11 MED ORDER — NICOTINE 21 MG/24HR TD PT24
21.0000 mg | MEDICATED_PATCH | Freq: Every day | TRANSDERMAL | Status: DC
  Administered 2015-02-11 – 2015-02-13 (×3): 21 mg via TRANSDERMAL
  Filled 2015-02-11 (×4): qty 1

## 2015-02-11 MED ORDER — BUPIVACAINE LIPOSOME 1.3 % IJ SUSP
INTRAMUSCULAR | Status: AC
  Filled 2015-02-11: qty 20

## 2015-02-11 MED ORDER — DIPHENHYDRAMINE HCL 12.5 MG/5ML PO ELIX: 12.5000 mg | ORAL_SOLUTION | Freq: Four times a day (QID) | ORAL | Status: DC | PRN

## 2015-02-11 MED ORDER — ONDANSETRON HCL 4 MG/2ML IJ SOLN
4.0000 mg | Freq: Once | INTRAMUSCULAR | Status: AC
  Administered 2015-02-11: 4 mg via INTRAVENOUS

## 2015-02-11 MED ORDER — HYDROMORPHONE HCL 1 MG/ML IJ SOLN
INTRAMUSCULAR | Status: AC
  Filled 2015-02-11: qty 1

## 2015-02-11 SURGICAL SUPPLY — 38 items
BAG HAMPER (MISCELLANEOUS) ×3 IMPLANT
BARRIER SKIN 2 1/4 (WOUND CARE) ×2 IMPLANT
BARRIER SKIN 2 1/4INCH (WOUND CARE) ×1
CELLS DAT CNTRL 66122 CELL SVR (MISCELLANEOUS) ×1 IMPLANT
CHLORAPREP W/TINT 26ML (MISCELLANEOUS) ×3 IMPLANT
CLAMP POUCH DRAINAGE QUIET (OSTOMY) ×3 IMPLANT
CLOTH BEACON ORANGE TIMEOUT ST (SAFETY) ×3 IMPLANT
COVER LIGHT HANDLE STERIS (MISCELLANEOUS) ×6 IMPLANT
DRAPE WARM FLUID 44X44 (DRAPE) ×3 IMPLANT
DRSG OPSITE POSTOP 4X8 (GAUZE/BANDAGES/DRESSINGS) ×3 IMPLANT
ELECT REM PT RETURN 9FT ADLT (ELECTROSURGICAL) ×3
ELECTRODE REM PT RTRN 9FT ADLT (ELECTROSURGICAL) ×1 IMPLANT
GAUZE SPONGE 4X4 12PLY STRL (GAUZE/BANDAGES/DRESSINGS) ×3 IMPLANT
GLOVE BIOGEL M 7.0 STRL (GLOVE) ×3 IMPLANT
GLOVE BIOGEL PI IND STRL 7.0 (GLOVE) ×2 IMPLANT
GLOVE BIOGEL PI INDICATOR 7.0 (GLOVE) ×4
GLOVE ECLIPSE 6.5 STRL STRAW (GLOVE) ×3 IMPLANT
GLOVE EXAM NITRILE MD LF STRL (GLOVE) ×3 IMPLANT
GLOVE SURG SS PI 7.5 STRL IVOR (GLOVE) ×6 IMPLANT
GOWN STRL REUS W/TWL LRG LVL3 (GOWN DISPOSABLE) ×9 IMPLANT
INST SET MAJOR GENERAL (KITS) ×3 IMPLANT
KIT ROOM TURNOVER APOR (KITS) ×3 IMPLANT
LIGASURE IMPACT 36 18CM CVD LR (INSTRUMENTS) ×3 IMPLANT
MANIFOLD NEPTUNE II (INSTRUMENTS) ×3 IMPLANT
NEEDLE HYPO 21X1 ECLIPSE (NEEDLE) ×3 IMPLANT
NS IRRIG 1000ML POUR BTL (IV SOLUTION) ×6 IMPLANT
PACK ABDOMINAL MAJOR (CUSTOM PROCEDURE TRAY) ×3 IMPLANT
PAD ARMBOARD 7.5X6 YLW CONV (MISCELLANEOUS) ×3 IMPLANT
POUCH OSTOMY 2 PC DRNBL 2.25 (WOUND CARE) ×1 IMPLANT
POUCH OSTOMY DRNBL 2 1/4 (WOUND CARE) ×2
RTRCTR WOUND ALEXIS 18CM MED (MISCELLANEOUS) ×3
STAPLER PROXIMATE 75MM BLUE (STAPLE) ×3 IMPLANT
STAPLER VISISTAT (STAPLE) ×3 IMPLANT
SUCTION POOLE TIP (SUCTIONS) ×3 IMPLANT
SUT CHROMIC 3 0 PS 2 (SUTURE) ×12 IMPLANT
SUT PDS AB CT VIOLET #0 27IN (SUTURE) ×6 IMPLANT
SUT SILK 3 0 SH CR/8 (SUTURE) ×3 IMPLANT
SYR 20CC LL (SYRINGE) ×3 IMPLANT

## 2015-02-11 NOTE — Anesthesia Procedure Notes (Signed)
Procedure Name: Intubation Date/Time: 02/11/2015 10:17 AM Performed by: Gershon Mussel, Cashe Gatt Pre-anesthesia Checklist: Patient identified, Patient being monitored, Timeout performed, Emergency Drugs available and Suction available Patient Re-evaluated:Patient Re-evaluated prior to inductionOxygen Delivery Method: Circle System Utilized Preoxygenation: Pre-oxygenation with 100% oxygen Intubation Type: IV induction Ventilation: Mask ventilation without difficulty Laryngoscope Size: Miller and 2 Grade View: Grade I Tube type: Oral Tube size: 7.0 mm Number of attempts: 1 Airway Equipment and Method: Stylet Placement Confirmation: ETT inserted through vocal cords under direct vision,  positive ETCO2 and breath sounds checked- equal and bilateral Secured at: 21 cm Tube secured with: Tape Dental Injury: Teeth and Oropharynx as per pre-operative assessment

## 2015-02-11 NOTE — Anesthesia Postprocedure Evaluation (Signed)
  Anesthesia Post-op Note  Patient: Andrew Kline  Procedure(s) Performed: Procedure(s): COLOSTOMY (N/A)  Patient Location: PACU  Anesthesia Type:General  Level of Consciousness: awake, alert , sedated, patient cooperative and responds to stimulation  Airway and Oxygen Therapy: Patient Spontanous Breathing and Patient connected to face mask oxygen  Post-op Pain: none  Post-op Assessment: Post-op Vital signs reviewed, Patient's Cardiovascular Status Stable, Respiratory Function Stable, Patent Airway, No signs of Nausea or vomiting and Pain level controlled              Post-op Vital Signs: Reviewed and stable  Last Vitals:  Filed Vitals:   02/11/15 1000  BP: 117/65  Pulse:   Temp:   Resp: 11    Complications: No apparent anesthesia complications

## 2015-02-11 NOTE — Progress Notes (Addendum)
Patient coughing up large amounts of thick, yellow sputum. Dr. Patsey Berthold notified. Dose of robinul given

## 2015-02-11 NOTE — Op Note (Signed)
Patient:  Andrew Kline  DOB:  1965-06-09  MRN:  [id number]   Preop Diagnosis:  Metastatic rectal carcinoma  Postop Diagnosis:  Same  Procedure:  Colostomy formation  Surgeon:  Aviva Signs, M.D.  Anes:  Gen. endotracheal  Indications:  Patient is a 49 year old white male who has been undergoing chemotherapy and radiation therapy for treatment of her rectal carcinoma with metastatic disease to the lungs. He has had extensive pelvic pain with narrowing of the rectal vault due to the treatment. He now presents for a colostomy to help with palliation of his rectal pain. The risks and benefits of the procedure including bleeding, infection, and cardiopulmonary difficulties were fully explained to the patient, who gave informed consent. He fully realizes that this is just a diversion colostomy and the procedure is palliative in nature.  Procedure note:  The patient is placed the supine position. After induction of general endotracheal anesthesia, the abdomen was prepped and draped using the usual sterile technique with ChloraPrep. Surgical site confirmation was performed.  A lower midline incision was made into the peritoneal cavity without difficulty. A self-retaining Alexis wound retractor was placed. The sigmoid colon was mobilized along the peritoneal reflection. At approximately the midportion of the sigmoid colon, a GIA 75 stapler was placed and fired. The mesentery was then divided down to its base using the LigaSure. The proximal end was then brought out through a ostomy with that was formed to the left of the umbilicus. Minimal exploration of the abdominal cavity was performed due to the nature of the procedure. The fascia was reapproximated using an 0 PDS running suture. Subcutaneous layer was irrigated normal saline. The skin was instilled with exploratory. The skin was closed using staples. Betadine ointment and a dry sterile dressing were applied. Next, the colostomy was matured using 3-0  chromic gut interrupted sutures. Good bleeding was noted at the mucosal edges. A patent colostomy was formed. An ostomy appliance was then applied.  All tape and needle counts were correct at the end of the procedure. Patient was extubated in the operating room and transferred to PACU in fair and stable condition.  Complications:  None  EBL:  50 mL  Specimen:  None

## 2015-02-11 NOTE — Transfer of Care (Signed)
Immediate Anesthesia Transfer of Care Note  Patient: Andrew Kline  Procedure(s) Performed: Procedure(s): COLOSTOMY (N/A)  Patient Location: PACU  Anesthesia Type:General  Level of Consciousness: sedated, patient cooperative and responds to stimulation  Airway & Oxygen Therapy: Patient Spontanous Breathing and Patient connected to face mask oxygen  Post-op Assessment: Report given to RN, Post -op Vital signs reviewed and stable and Patient moving all extremities X 4  Post vital signs: Reviewed and stable  Last Vitals:  Filed Vitals:   02/11/15 1000  BP: 117/65  Pulse:   Temp:   Resp: 11    Complications: No apparent anesthesia complications

## 2015-02-11 NOTE — Anesthesia Preprocedure Evaluation (Signed)
Anesthesia Evaluation  Patient identified by MRN, date of birth, ID band Patient awake    Reviewed: Allergy & Precautions, NPO status , Patient's Chart, lab work & pertinent test results  Airway Mallampati: II  TM Distance: >3 FB     Dental  (+) Edentulous Upper, Poor Dentition, Chipped, Missing, Dental Advisory Given   Pulmonary shortness of breath and with exertion, Current Smoker,  Lung nodules on CT, occasional hemoptysis   breath sounds clear to auscultation       Cardiovascular negative cardio ROS   Rhythm:Regular Rate:Normal     Neuro/Psych PSYCHIATRIC DISORDERS Anxiety Depression    GI/Hepatic Colon  Cancer with lung mets    Endo/Other    Renal/GU Renal disease (partial neph for renal cell CA)     Musculoskeletal   Abdominal   Peds  Hematology   Anesthesia Other Findings   Reproductive/Obstetrics                             Anesthesia Physical Anesthesia Plan  ASA: IV  Anesthesia Plan: General   Post-op Pain Management:    Induction: Intravenous  Airway Management Planned: Oral ETT  Additional Equipment:   Intra-op Plan:   Post-operative Plan: Extubation in OR  Informed Consent: I have reviewed the patients History and Physical, chart, labs and discussed the procedure including the risks, benefits and alternatives for the proposed anesthesia with the patient or authorized representative who has indicated his/her understanding and acceptance.     Plan Discussed with:   Anesthesia Plan Comments:         Anesthesia Quick Evaluation

## 2015-02-11 NOTE — Interval H&P Note (Signed)
History and Physical Interval Note:  02/11/2015 9:11 AM  Andrew Kline  has presented today for surgery, with the diagnosis of colon cancer  The various methods of treatment have been discussed with the patient and family. After consideration of risks, benefits and other options for treatment, the patient has consented to  Procedure(s): COLOSTOMY (N/A) as a surgical intervention .  The patient's history has been reviewed, patient examined, no change in status, stable for surgery.  I have reviewed the patient's chart and labs.  Questions were answered to the patient's satisfaction.     Aviva Signs A

## 2015-02-11 NOTE — Anesthesia Preprocedure Evaluation (Deleted)
Anesthesia Evaluation    Airway       Dental   Pulmonary Current Smoker,          Cardiovascular     Neuro/Psych    GI/Hepatic   Endo/Other    Renal/GU      Musculoskeletal   Abdominal   Peds  Hematology   Anesthesia Other Findings   Reproductive/Obstetrics                           Anesthesia Physical Anesthesia Plan Anesthesia Quick Evaluation  

## 2015-02-12 ENCOUNTER — Encounter (HOSPITAL_COMMUNITY): Payer: Self-pay | Admitting: General Surgery

## 2015-02-12 LAB — CBC
HCT: 34.4 % — ABNORMAL LOW (ref 39.0–52.0)
Hemoglobin: 11.2 g/dL — ABNORMAL LOW (ref 13.0–17.0)
MCH: 31.5 pg (ref 26.0–34.0)
MCHC: 32.6 g/dL (ref 30.0–36.0)
MCV: 96.9 fL (ref 78.0–100.0)
PLATELETS: 254 10*3/uL (ref 150–400)
RBC: 3.55 MIL/uL — AB (ref 4.22–5.81)
RDW: 17.8 % — ABNORMAL HIGH (ref 11.5–15.5)
WBC: 5.3 10*3/uL (ref 4.0–10.5)

## 2015-02-12 LAB — BASIC METABOLIC PANEL
Anion gap: 6 (ref 5–15)
BUN: <5 mg/dL — ABNORMAL LOW (ref 6–20)
CALCIUM: 8.3 mg/dL — AB (ref 8.9–10.3)
CO2: 28 mmol/L (ref 22–32)
Chloride: 104 mmol/L (ref 101–111)
Creatinine, Ser: 0.73 mg/dL (ref 0.61–1.24)
GFR calc Af Amer: >60 mL/min (ref >60)
GLUCOSE: 128 mg/dL — AB (ref 65–99)
Potassium: 3.8 mmol/L (ref 3.5–5.1)
Sodium: 138 mmol/L (ref 135–145)

## 2015-02-12 LAB — MAGNESIUM: Magnesium: 1.9 mg/dL (ref 1.7–2.4)

## 2015-02-12 LAB — PHOSPHORUS: Phosphorus: 4.1 mg/dL (ref 2.5–4.6)

## 2015-02-12 MED ORDER — MAGNESIUM HYDROXIDE 400 MG/5ML PO SUSP
30.0000 mL | Freq: Two times a day (BID) | ORAL | Status: DC
  Administered 2015-02-12 – 2015-02-14 (×5): 30 mL via ORAL
  Filled 2015-02-12 (×5): qty 30

## 2015-02-12 MED ORDER — MENTHOL 3 MG MT LOZG
1.0000 | LOZENGE | OROMUCOSAL | Status: DC | PRN
  Administered 2015-02-12: 3 mg via ORAL
  Filled 2015-02-12 (×2): qty 9

## 2015-02-12 NOTE — Addendum Note (Signed)
Addendum  created 02/12/15 1506 by Vista Deck, CRNA   Modules edited: Notes Section   Notes Section:  File: [id number]

## 2015-02-12 NOTE — Consult Note (Signed)
WOC ostomy consult note Stoma type/location:  Pt received a permanent colostomy yesterday to LLQ Stomal assessment/size: Stoma red and viable when visualized through pouch, which is intact with good seal.   Output: First post-op day; there is no stool or flatus at this time.  Ostomy pouching: 2 piece Education provided:  Called ahead in the early AM to inform bedside nurse of ostomy teaching session at 1030.  Pt states he was unable to reach his wife via phone to have her present for a teaching session today. Offered to perform pouch change. He requests the pouch change demonstration be performed tomorrow at 11:00.  Demonstrated pouch appearance and discussed pouching routines and ordering supplies.  Pt is able to open and close velcro to empty.  Educational materials and extra pouching supplies left at the bedside. Pt is well-informed since he has a family member with an ostomy.  He asked appropriate questions.  [place] team will plan to arrive at 11:00 as requested for the teaching session on Fri.  Pt could benefit from home health assistance after discharge. Enrolled patient in [place] program: Yes Julien Girt MSN, RN, [place], [place], [place]

## 2015-02-12 NOTE — Progress Notes (Addendum)
Patient ambulated to nurses station and back with moderate pain.  Tolerated well.  Upon standing noted that patient had several clusters of what appears to be serous filled blisters to his buttocks and sacral area. Patient unclear as to if this is a chronic issue.   MD made aware.  Patient declines side lying positions a this time.

## 2015-02-12 NOTE — Progress Notes (Addendum)
Initial Nutrition Assessment  DOCUMENTATION CODES:  Non-severe (moderate) malnutrition in context of chronic illness  INTERVENTION:  Ensure Enlive po BID, each supplement provides 350 kcal and 20 grams of protein  Gave Education on post op diet  Gain education on  maintaining hydration  Left my contact info, coupons, samples, and handouts titled "Eating right and avoiding dehydration after bowel surgery" and "Diet after a colostomy or ileostomy"  NUTRITION DIAGNOSIS:  Increased nutrient needs related to cancer and cancer related treatments as evidenced by loss of >11% bw in 3 months.  GOAL:  Patient will meet greater than or equal to 90% of their needs  MONITOR:  PO intake, Supplement acceptance, Labs, Weight trends, Skin  REASON FOR ASSESSMENT:  Malnutrition Screening Tool    ASSESSMENT:  49 y/o male PMHx significant for Stage IV CRC w/ lung metastases now POD #1 palliative colostomy formation.   Pt reports that he, for the most part, has not had a major change in appetite. He states he has always been a huge eater with a high metabolism. He is eating less, but he still eats L/D each a day. He does "graze" at breakfast though. His biggest issue has been his complete loss of bowel function.  He says that he has been eating less to avoid dealing with his bowel incontinence  and the severe discomfort/pain he experiences when having a BM. He believes the colostomy will help improve his appetite.   Questioned patient regarding how he believes he was doing with hydration. He does believe he experienced some dehydration while suffering from the severe diarrhea. RD went over best beverages for rehydrating and gave ORS recipe. Explained that soda/caffeine containing beverages are not ideal for rehydrating. Went over symptoms indicative of dehydration  He has only just started trying Ensure supplements. Family member/friend in the room thought this could be due to being able to afford them.   He is [facility] at [facility] and would greatly benefit from a free case of Ensure due to his perceived financial difficulty and weight loss. Will order chocolate case.  Briefly discussed a GI soft diet that he should probably adhere to while his new wounds heal. Went over which foods to avoid. Deferred exact diet recommendations and duration  to surgeon.   He has lost a significant amount of weight these past few months. He states he has lost more weight than just what there is record of.   Mild facial fat/muscle wasting noted.   Diet Order:  Diet regular Room service appropriate?: Yes; Fluid consistency:: Thin  Skin:  POD 1 new colostomy  Last BM:  10/12-POD 1new colostomy   Height:  Ht Readings from Last 1 Encounters:  02/11/15 '5\' 6"'$  (1.676 m)   Weight:  Wt Readings from Last 1 Encounters:  02/11/15 182 lb (82.555 kg)   Wt Readings from Last 10 Encounters:  02/11/15 182 lb (82.555 kg)  02/05/15 182 lb (82.555 kg)  01/28/15 184 lb (83.462 kg)  12/31/14 195 lb 6.4 oz (88.633 kg)  12/17/14 196 lb 3.2 oz (88.996 kg)  12/10/14 194 lb (87.998 kg)  12/03/14 201 lb (91.173 kg)  11/19/14 207 lb 9.6 oz (94.167 kg)  11/14/14 203 lb 9.6 oz (92.352 kg)  10/31/14 205 lb 9.6 oz (93.26 kg)   Ideal Body Weight:  64.55 kg  BMI:  Body mass index is 29.39 kg/(m^2).  Estimated Nutritional Needs:  Kcal:  1900-2050 kcals (23-25 kcal/kg) Protein:  90-104 g (1.4-1.6 g/kg IBW) Fluid:  1.9-2.1  liters  EDUCATION NEEDS:  Education needs addressed  Burtis Junes RD, LDN Nutrition Pager: [phone] 02/12/2015 6:31 PM

## 2015-02-12 NOTE — Care Management Note (Signed)
Case Management Note  Patient Details  Name: Andrew Kline MRN: [id number] Date of Birth: Jul 29, 1965  Subjective/Objective:                  Pt admitted from home s/p colostomy placement. Pt lives with his wife and will return home at discharge. Pt has been independent with ADL's.  Action/Plan: Pt would like HH RN at discharge with [place]. Romualdo Bolk of [facility] is aware and will collect the pts information from the chart. [place] services to start within 48 hours of discharge. WOC RN to start working with pt today on colostomy care. Will continue to follow.  Expected Discharge Date:                  Expected Discharge Plan:  [place]  In-House Referral:  NA  Discharge planning Services  CM Consult  Post Acute Care Choice:  Home Health Choice offered to:  Patient  DME Arranged:    DME Agency:     HH Arranged:  RN [place] Agency:  [place]  Status of Service:  In process, will continue to follow  Medicare Important Message Given:    Date Medicare IM Given:    Medicare IM give by:    Date Additional Medicare IM Given:    Additional Medicare Important Message give by:     If discussed at [place] of Stay Meetings, dates discussed:    Additional Comments:  Joylene Draft, RN 02/12/2015, 10:58 AM

## 2015-02-12 NOTE — Progress Notes (Signed)
1 Day Post-Op  Subjective: Moderate incisional pain, although it is controlled with current pain regimen.  Objective: Vital signs in last 24 hours: Temp:  [97.5 F (36.4 C)-98.8 F (37.1 C)] 98.5 F (36.9 C) (10/13 0541) Pulse Rate:  [68-85] 72 (10/13 0541) Resp:  [11-37] 18 (10/13 0541) BP: (102-135)/(50-81) 127/80 mmHg (10/13 0541) SpO2:  [91 %-100 %] 91 % (10/13 0541) Weight:  [82.555 kg (182 lb)] 82.555 kg (182 lb) (10/12 0911) Last BM Date: 02/11/15  Intake/Output from previous day: 10/12 0701 - 10/13 0700 In: 8676 [P.O.:240; I.V.:1211] Out: 200 [Urine:175; Blood:25] Intake/Output this shift: Total I/O In: 240 [P.O.:240] Out: 400 [Urine:400]  General appearance: alert, cooperative and no distress Resp: clear to auscultation bilaterally Cardio: regular rate and rhythm, S1, S2 normal, no murmur, click, rub or gallop GI: Soft. Incision healing well. Ostomy pink and patent. No significant ostomy output yet.  Lab Results:   Recent Labs  02/12/15 0627  WBC 5.3  HGB 11.2*  HCT 34.4*  PLT 254   BMET  Recent Labs  02/12/15 0627  NA 138  K 3.8  CL 104  CO2 28  GLUCOSE 128*  BUN <5*  CREATININE 0.73  CALCIUM 8.3*   PT/INR No results for input(s): LABPROT, INR in the last 72 hours.  Studies/Results: No results found.  Anti-infectives: Anti-infectives    Start     Dose/Rate Route Frequency Ordered Stop   02/11/15 2200  amoxicillin-clavulanate (AUGMENTIN) 875-125 MG per tablet 1 tablet     1 tablet Oral 2 times daily 02/11/15 1322     02/11/15 0900  cefoTEtan in Dextrose 5% (CEFOTAN) IVPB 2 g  Status:  Discontinued     2 g Intravenous To Surgery 02/11/15 0844 02/11/15 1329      Assessment/Plan: s/p Procedure(s): COLOSTOMY Impression: Stable on postoperative day 1. Awaiting return of bowel function.  Plan: Patient to see ostomy nurse today. Once bowel function returns, patient will be discharged.  LOS: 1 day    Torren Maffeo A 02/12/2015

## 2015-02-12 NOTE — Anesthesia Postprocedure Evaluation (Signed)
  Anesthesia Post-op Note  Patient: Andrew Kline  Procedure(s) Performed: Procedure(s): COLOSTOMY (N/A)  Patient Location: Nursing Unit  Anesthesia Type:General  Level of Consciousness: awake and patient cooperative  Airway and Oxygen Therapy: Patient Spontanous Breathing  Post-op Pain: mild  Post-op Assessment: Post-op Vital signs reviewed, Patient's Cardiovascular Status Stable, Respiratory Function Stable and Patent Airway              Post-op Vital Signs: Reviewed and stable  Last Vitals:  Filed Vitals:   02/12/15 1422  BP: 142/75  Pulse: 76  Temp: 36.9 C  Resp: 18    Complications: No apparent anesthesia complications

## 2015-02-13 DIAGNOSIS — E440 Moderate protein-calorie malnutrition: Secondary | ICD-10-CM | POA: Insufficient documentation

## 2015-02-13 LAB — BASIC METABOLIC PANEL
Anion gap: 7 (ref 5–15)
BUN: <5 mg/dL — ABNORMAL LOW (ref 6–20)
CALCIUM: 8.4 mg/dL — AB (ref 8.9–10.3)
CHLORIDE: 103 mmol/L (ref 101–111)
CO2: 29 mmol/L (ref 22–32)
CREATININE: 0.61 mg/dL (ref 0.61–1.24)
GFR calc Af Amer: >60 mL/min (ref >60)
Glucose, Bld: 108 mg/dL — ABNORMAL HIGH (ref 65–99)
Potassium: 4.1 mmol/L (ref 3.5–5.1)
SODIUM: 139 mmol/L (ref 135–145)

## 2015-02-13 LAB — CBC
HCT: 35.8 % — ABNORMAL LOW (ref 39.0–52.0)
Hemoglobin: 11.7 g/dL — ABNORMAL LOW (ref 13.0–17.0)
MCH: 31.6 pg (ref 26.0–34.0)
MCHC: 32.7 g/dL (ref 30.0–36.0)
MCV: 96.8 fL (ref 78.0–100.0)
PLATELETS: 264 10*3/uL (ref 150–400)
RBC: 3.7 MIL/uL — ABNORMAL LOW (ref 4.22–5.81)
RDW: 17.4 % — AB (ref 11.5–15.5)
WBC: 5.7 10*3/uL (ref 4.0–10.5)

## 2015-02-13 MED ORDER — BACITRACIN-NEOMYCIN-POLYMYXIN 400-5-5000 EX OINT
TOPICAL_OINTMENT | Freq: Two times a day (BID) | CUTANEOUS | Status: DC
  Administered 2015-02-13: 12:00:00 via TOPICAL
  Administered 2015-02-13 – 2015-02-14 (×2): 1 via TOPICAL
  Filled 2015-02-13 (×3): qty 1

## 2015-02-13 NOTE — Progress Notes (Signed)
2 Days Post-Op  Subjective: Incisional pain has eased. It is starting to have ostomy output.  Objective: Vital signs in last 24 hours: Temp:  [97.8 F (36.6 C)-98.4 F (36.9 C)] 97.8 F (36.6 C) (10/14 0644) Pulse Rate:  [73-85] 73 (10/14 0644) Resp:  [16-20] 20 (10/14 0644) BP: (142-169)/(75-82) 149/82 mmHg (10/14 0644) SpO2:  [91 %-93 %] 93 % (10/14 0644) Last BM Date: 02/11/15  Intake/Output from previous day: 10/13 0701 - 10/14 0700 In: 1467 [P.O.:600; I.V.:867] Out: 3400 [Urine:3400] Intake/Output this shift: Total I/O In: 240 [P.O.:240] Out: 1000 [Urine:1000]  General appearance: alert and cooperative Resp: clear to auscultation bilaterally Cardio: regular rate and rhythm, S1, S2 normal, no murmur, click, rub or gallop GI: Soft, incision healing well. Ostomy pink and patent. Skin: Skin around the anus and buttocks with blistering present. No purulent drainage noted. Skin is raw.  Lab Results:   Recent Labs  02/12/15 0627 02/13/15 0620  WBC 5.3 5.7  HGB 11.2* 11.7*  HCT 34.4* 35.8*  PLT 254 264   BMET  Recent Labs  02/12/15 0627 02/13/15 0620  NA 138 139  K 3.8 4.1  CL 104 103  CO2 28 29  GLUCOSE 128* 108*  BUN <5* <5*  CREATININE 0.73 0.61  CALCIUM 8.3* 8.4*   PT/INR No results for input(s): LABPROT, INR in the last 72 hours.  Studies/Results: No results found.  Anti-infectives: Anti-infectives    Start     Dose/Rate Route Frequency Ordered Stop   02/11/15 2200  amoxicillin-clavulanate (AUGMENTIN) 875-125 MG per tablet 1 tablet     1 tablet Oral 2 times daily 02/11/15 1322     02/11/15 0900  cefoTEtan in Dextrose 5% (CEFOTAN) IVPB 2 g  Status:  Discontinued     2 g Intravenous To Surgery 02/11/15 0844 02/11/15 1329      Assessment/Plan: s/p Procedure(s): COLOSTOMY Impression: Stable postoperative day 2. Is completing ostomy instruction today. Perianal blistering will be monitored. Ostomy starting to function. Into space discharge in  next 24-48 hours.  LOS: 2 days    Chadd Tollison A 02/13/2015

## 2015-02-13 NOTE — Consult Note (Signed)
WOC ostomy consult note Stoma type/location: LLQ Colostomy palliative diversion due to rectal pain.  Stomal assessment/size: 1 1/8" slightly oval stoma.  Some stomal separation noted from 9 to 11 0'clock.  Will fill this defect with stoma powder and apply barrier ring to peristomal area.  Peristomal assessment: Stomal separation.  Medical adhesive related skin injury (MARSI) noted to peristomal skin.  3 cm x 0.6 cm ruptured blister from ostomy barrier and edema.  Resolving.  NO other MARSI or skin irritation noted to this area.  This area will not be covered in subsequent pouching to allow for healing.  Treatment options for stomal/peristomal skin: Off center barrier placement to avoid the peristomal blistering.  Stoma powder in defect and barrier ring Output Only blood tinged liquid noted today.  Ostomy pouching: 2pc. 2 1/4" pouch Barrier rings stoma powder  Education provided: Wife and mother at bedside.  Patient mother is an EMT and states she is knowledgeable.  Wife will be primary caregiver and participates in pouch change today.  Explained rationale for stoma powder and barrier ring due to separation.  Stoma is measured and wife cuts barrier to fit.  Barrier and pouch applied.  Wife demonstrates roll open and closure.  Explained to empty when 1/3 full.  Discussed self care including frequency of pouch change and showering.  Enrolled patient in [place] program: Yes will order Ceraplus barriers due to sensitive skin.   WOC wound consult note Reason for Consult:Blistering to bilateral buttocks and sacrum.  Mother states he was on a disposable, plastic CHUX pad after surgery.  Patient has very sensitive skin.   Wound type:Dermatitis from exposure to moisture and plastic underpad.  Pressure Ulcer POA: No Measurement:LEft buttock 8 cm x 4 cm intact serum filled blisters Sacrum 2 cm x 2 cm erythema and tenderness intact Right buttock 6 cm x 4 cm intact serum filled blister Wound  YPP:JKDTO blisters Drainage (amount, consistency, odor) Serum noted on bedding beneath patient as some blisters have ruptured. Periwound:Erythema Dressing procedure/placement/frequency: Keep skin clean and dry.  Use Dermatherapy linen only under patient.  This will wick excess moisture and promote healing.  Offload pressure and encourage to turn and reposition frequently.  Wife and mother are educated to encourage this.  [place] team will continue to follow and remain available to patient, medical and nursing teams.   Domenic Moras RN BSN [place] Pager [phone]

## 2015-02-13 NOTE — Care Management Note (Signed)
Case Management Note  Patient Details  Name: Andrew Kline MRN: [id number] Date of Birth: 03/11/1966  Expected Discharge Date:                  Expected Discharge Plan:  [place]  In-House Referral:  NA  Discharge planning Services  CM Consult  Post Acute Care Choice:  Home Health Choice offered to:  Patient  DME Arranged:    DME Agency:     HH Arranged:  RN [place] Agency:  [place]  Status of Service:  Completed, signed off  Medicare Important Message Given:    Date Medicare IM Given:    Medicare IM give by:    Date Additional Medicare IM Given:    Additional Medicare Important Message give by:     If discussed at [place] of Stay Meetings, dates discussed:    Additional Comments: Anticipate DC over weekend once bowel function returns. Pt DC home with [place] serivces. Romualdo Bolk, of [facility], aware of referral and will obtain pt info. Pt aware [facility] has 48 hours to make first visit. No further CM needs noted. Weekend RN will notify [facility] when pt is DC'd.  Sherald Barge, RN 02/13/2015, 3:47 PM

## 2015-02-14 MED ORDER — FENTANYL 75 MCG/HR TD PT72: 75.0000 ug | MEDICATED_PATCH | TRANSDERMAL | Status: DC

## 2015-02-14 NOTE — Discharge Summary (Signed)
Physician Discharge Summary  Patient ID: Andrew Kline MRN: [id number] DOB/AGE: 1965/12/25 49 y.o.  Admit date: 02/11/2015 Discharge date: 02/14/2015  Admission Diagnoses:  Metastatic rectal carcinoma  Discharge Diagnoses: Same Active Problems:   Rectal carcinoma ([place])   Malnutrition of moderate degree ([place])   Discharged Condition: fair  Hospital Course: Patient is a 49 year old white male with history of metastatic rectal carcinoma who has undergone both chemotherapy and radiation therapy. Due to significant skin breakdown and pain in the perianal region, a palliative diverting colostomy was performed on 02/11/2015. He has seen the ostomy nurse for instruction. Pain management has been an issue with this patient. I have increased his Duragesic patch to 75 g an hour. He is being discharged home in fair condition.  Treatments: surgery: Diverting colostomy on 02/11/2015  Discharge Exam: Blood pressure 174/94, pulse 72, temperature 98.2 F (36.8 C), temperature source Oral, resp. rate 20, height '5\' 6"'  (1.676 m), weight 82.555 kg (182 lb), SpO2 98 %. General appearance: alert and cooperative Resp: clear to auscultation bilaterally Cardio: regular rate and rhythm, S1, S2 normal, no murmur, click, rub or gallop GI: Soft. Incision healing well. Ostomy pink and patent.  Disposition: 01-Home or Self Care     Medication List    STOP taking these medications        fentaNYL 25 MCG/HR patch  Commonly known as:  DURAGESIC - dosed mcg/hr  Replaced by:  fentaNYL 75 MCG/HR      TAKE these medications        amitriptyline 50 MG tablet  Commonly known as:  ELAVIL  Take 1 tablet (50 mg total) by mouth at bedtime.     amoxicillin-clavulanate 875-125 MG tablet  Commonly known as:  AUGMENTIN  Take 1 tablet by mouth 2 (two) times daily.     AVASTIN IV  Inject into the vein every 14 (fourteen) days. To start November 19, 2014     citalopram 40 MG tablet  Commonly known as:  CELEXA  TAKE  1/2 TABLET BY MOUTH FOR 5 DAYS  THEN ONE TABLET DAILY THEREAFTER.     dextrose 5 % SOLN 1,000 mL with fluorouracil 5 GM/100ML SOLN  Inject into the vein every 14 (fourteen) days. To start November 05, 2014. To infuse over 46 hours via pump.     docusate sodium 100 MG capsule  Commonly known as:  COLACE  Take 1 capsule (100 mg total) by mouth every 12 (twelve) hours.     fentaNYL 75 MCG/HR  Commonly known as:  DURAGESIC  Place 1 patch (75 mcg total) onto the skin every 3 (three) days.     HYDROmorphone 2 MG tablet  Commonly known as:  DILAUDID  Take 3 tablets every 4 hours as needed for pain.     leucovorin 50 MG injection  Inject into the vein every 14 (fourteen) days. To start November 05, 2014     lidocaine-hydrocortisone 3-1 % Kit  Commonly known as:  ANAMANTLE  Place 1 application rectally 2 (two) times daily. For two weeks, alternate with Rectiv.     lidocaine-prilocaine cream  Commonly known as:  EMLA  Apply a quarter size amount to port site 1 hour prior to chemo. Do not rub in. Cover with plastic wrap.     nitroGLYCERIN 2 % ointment  Commonly known as:  NITROGLYN  Apply 0.5 inches topically daily. Anal area     ondansetron 8 MG tablet  Commonly known as:  ZOFRAN  Take 1 tablet (8 mg  total) by mouth every 8 (eight) hours as needed for nausea or vomiting.     OXALIPLATIN IV  Inject into the vein every 14 (fourteen) days. To start November 05, 2014     prochlorperazine 10 MG tablet  Commonly known as:  COMPAZINE  Take 1 tablet (10 mg total) by mouth every 6 (six) hours as needed for nausea or vomiting.           Follow-up Information    Follow up with [facility].   Contact information:   [address] [place] [address] [phone]       Follow up with Jamesetta So, MD. Schedule an appointment as soon as possible for a visit on 02/24/2015.   Specialty:  General Surgery   Contact information:   [address]  [address] [phone]       Signed: Aviva Signs A 02/14/2015, 11:54 AM

## 2015-02-14 NOTE — Discharge Instructions (Signed)
Colostomy Surgery, Adult, Care After Refer to this sheet in the next few weeks. These instructions provide you with information on caring for yourself after your procedure. Your caregiver may also give you more specific instructions. Your treatment has been planned according to current medical practices, but problems sometimes occur. Call your caregiver if you have any problems or questions after your procedure. HOME CARE INSTRUCTIONS  Rest. Give yourself time to adjust to having the external pouch. Give your surgical cuts (incisions) time to heal.  Avoid strenuous activity, heavy lifting, and abdominal exercises for 3 weeks. After that, you may return to your normal activities.  Take pain medicine and other medicines as directed. Your caregiver may recommend certain medicines for the relief of constipation or diarrhea.  You may shower with or without the pouch.  Wear any clothing that is comfortable.  Follow your caregiver's instructions to protect the skin around the stoma.  Change your pouch every 2 to 4 days, or as directed.  Follow your caregiver's dietary instructions. Your caregiver will help you understand which foods may cause blockage, increase bowel movements, slow bowel movements, cause skin irritation, or cause gas.  You may gradually resume most activities, including sexual activity, as directed. Ask your caregiver about becoming pregnant and about using birth control. Medicines may not be absorbed normally after your procedure.  Always discuss your medicines with your caregiver before taking them.  You may travel. Be sure to pack plenty of colostomy supplies.  If you smoke, quit. Smoking slows the healing process. SEEK MEDICAL CARE IF:  You are having trouble caring for your stoma or using the ostomy supplies (changing the pouch).  You feel sick to your stomach (nauseous).  You throw up (vomit).  You notice bleeding, skin irritation, drainage, bulging, redness of the  wounds, or pain around the anus or stoma.  You notice a change in the size or appearance of the stoma.  You have abdominal pain, bloating, pressure, or cramping.  Your stools do not become firmer.  Your stool frequency is more or less often than expected.  You have frequent diarrhea or watery stool.  You are not making enough urine. This may be a sign of dehydration.  You experience sexual dysfunction.  You experience shortness of breath, fatigue, thirst, dry mouth, or unusual sensations in the limbs.  You have other new symptoms.  You have questions or concerns. SEEK IMMEDIATE MEDICAL CARE IF:  Your abdominal pain does not go away or it becomes severe.  You have a fever.  You keep vomiting.  Your stool is not draining through the stoma.  You have an irregular heartbeat or chest pain. MAKE SURE YOU:  Understand these instructions.  Will watch your condition.  Will get help right away if you are not doing well or get worse.   This information is not intended to replace advice given to you by your health care provider. Make sure you discuss any questions you have with your health care provider.   Document Released: 09/08/2010 Document Revised: 05/09/2014 Document Reviewed: 09/08/2010 Elsevier Interactive Patient Education 2016 [place] A colostomy is an opening for stool to leave your body when a medical condition prevents it from leaving through the usual opening (rectum). During a surgery, a piece of large intestine (colon) is brought through a hole in the abdominal wall. The new opening is called a stoma or ostomy. A bag or pouch fits over the stoma to catch stool and gas. Your  stool may be liquid, somewhat pasty, or formed. CARING FOR YOUR STOMA  Normally, the stoma looks a lot like the inside of your cheek: pink, red, and moist. At first it may be swollen, but this swelling will decrease within 6 weeks. Keep the skin around your stoma  clean and dry. You can gently wash your stoma and the skin around your stoma in the shower with a clean, soft washcloth. If you develop any skin irritation, your caregiver may give you a stoma powder or ointment to help heal the area. Do not use any products other than those specifically given to you by your caregiver.  Your stoma should not be uncomfortable. If you notice any stinging or burning, your pouch may be leaking, and the skin around your stoma may be coming into contact with stool. This can cause skin irritation. If you notice stinging, replace your pouch with a new one and discard the old one. OSTOMY POUCHES  The pouch that fits over the ostomy can be made up of either 1 or 2 pieces. A one-piece pouch has a skin barrier piece and the pouch itself in one unit. A two-piece pouch has a skin barrier with a separate pouch that snaps on and off of the skin barrier. Either way, you should empty the pouch when it is only  to  full. Do not let more stool or gas build up. This could cause the pouch to leak. Some ostomy bags have a built-in gas release valve. Ostomy deodorizer (5 drops) can be put into the pouch to prevent odor. Some people use ostomy lubricant drops inside the pouch to help the stool slide out of the bag more easily and completely.  EMPTYING YOUR OSTOMY POUCH  You may get lessons on how to empty your pouch from a wound-ostomy nurse before you leave the hospital. Here are the basic steps:  Wash your hands with soap and water.  Sit far back on the toilet.  Put several pieces of toilet paper into the toilet water. This will prevent splashing as you empty the stool into the toilet bowl.  Unclip or unvelcro the tail end of the pouch.  Unroll the tail and empty stool into the toilet.  Clean the tail with toilet paper.  Reroll the tail, and clip or velcro it closed.  Wash your hands again. CHANGING YOUR OSTOMY POUCH  Change your ostomy pouch about every 3 to 4 days for the first  6 weeks, then every 5 to7 days. Always change the bag sooner if there is any leakage or you begin to notice any discomfort or irritation of the skin around the stoma. When possible, plan to change your ostomy pouch before eating or drinking as this will lessen the chance of stool coming out during the pouch change. A wound-ostomy nurse may teach you how to change your pouch before you leave the hospital. Here are the basic steps:  Lay out your supplies.  Wash your hands with soap and water.  Carefully remove the old pouch.  Wash the stoma and allow it to dry. Men may be advised to shave any hair around the stoma very carefully. This will make the adhesive stick better.  Use the stoma measuring guide that comes with your pouch set to decide what size hole you will need to cut in the skin barrier piece. Choose the smallest possible size that will hold the stoma but will not touch it.  Use the guide to trace the circle on  the back of the skin barrier piece. Cut out the hole.  Hold the skin barrier piece over the stoma to make sure the hole is the correct size.  Remove the adhesive paper backing from the skin barrier piece.  Squeeze stoma paste around the opening of the skin barrier piece.  Clean and dry the skin around the stoma again.  Carefully fit the skin barrier piece over your stoma.  If you are using a two-piece pouch, snap the pouch onto the skin barrier piece.  Close the tail of the pouch.  Put your hand over the top of the skin barrier piece to help warm it for about 5 minutes, so that it conforms to your body better.  Wash your hands again. DIET TIPS   Continue to follow your usual diet.  Drink about eight 8 oz glasses of water each day.  You can prevent gas by eating slowly and chewing your food thoroughly.  If you feel concerned that you have too much gas, you can cut back on gas-producing foods, such as:  Spicy foods.  Onions and garlic.  Cruciferous vegetables  (cabbage, broccoli, cauliflower, Brussels sprouts).  Beans and legumes.  Some cheeses.  Eggs.  Fish.  Bubbly (carbonated) drinks.  Chewing gum. GENERAL TIPS   You can shower with or without the bag in place.  Always keep the bag on if you are bathing or swimming.  If your bag gets wet, you can dry it with a blow-dryer set to cool.  Avoid wearing tight clothing directly over your stoma so that it does not become irritated or bleed. Tight clothing can also prevent stool from draining into the pouch.  It is helpful to always have an extra skin barrier and pouch with you when traveling. Do not leave them anywhere too warm, as parts of them can melt.  Do not let your seat belt rest on your stoma. Try to keep the seat belt either above or below your stoma, or use a tiny pillow to cushion it.  You can still participate in sports, but you should avoid activities in which there is a risk of getting hit in the abdomen.  You can still have sex. It is a good idea to empty your pouch prior to sex. Some people and their partners feel very comfortable seeing the pouch during sex. Others choose to wear lingerie or a T-shirt that covers the device. SEEK IMMEDIATE MEDICAL CARE IF:  You notice a change in the size or color of the stoma, especially if it becomes very red, purple, black, or pale white.  You have bloody stools or bleeding from the stoma.  You have abdominal pain, nausea, vomiting, or bloating.  There is anything unusual protruding from the stoma.  You have irritation or red skin around the stoma.  No stool is passing from the stoma.  You have diarrhea (requiring more frequent than normal pouch emptying).   This information is not intended to replace advice given to you by your health care provider. Make sure you discuss any questions you have with your health care provider.   Document Released: 04/21/2003 Document Revised: 07/11/2011 Document Reviewed: 09/15/2010 Elsevier  Interactive Patient Education [employer].

## 2015-02-17 ENCOUNTER — Other Ambulatory Visit (HOSPITAL_COMMUNITY): Payer: Self-pay | Admitting: Hematology & Oncology

## 2015-02-17 MED ORDER — FENTANYL 25 MCG/HR TD PT72: 25.0000 ug | MEDICATED_PATCH | TRANSDERMAL | Status: DC

## 2015-02-17 MED ORDER — HYDROMORPHONE HCL 4 MG PO TABS: 4.0000 mg | ORAL_TABLET | ORAL | Status: DC | PRN

## 2015-02-19 ENCOUNTER — Encounter (HOSPITAL_COMMUNITY): Payer: Self-pay | Admitting: Hematology & Oncology

## 2015-02-19 ENCOUNTER — Encounter (HOSPITAL_COMMUNITY): Payer: Medicaid Other | Attending: Hematology & Oncology | Admitting: Hematology & Oncology

## 2015-02-19 ENCOUNTER — Encounter: Payer: Self-pay | Admitting: Dietician

## 2015-02-19 VITALS — BP 140/96 | HR 93 | Temp 98.1°F | Resp 20 | Wt 175.0 lb

## 2015-02-19 DIAGNOSIS — C7801 Secondary malignant neoplasm of right lung: Secondary | ICD-10-CM | POA: Diagnosis not present

## 2015-02-19 DIAGNOSIS — C20 Malignant neoplasm of rectum: Secondary | ICD-10-CM | POA: Diagnosis present

## 2015-02-19 DIAGNOSIS — R918 Other nonspecific abnormal finding of lung field: Secondary | ICD-10-CM | POA: Insufficient documentation

## 2015-02-19 DIAGNOSIS — G893 Neoplasm related pain (acute) (chronic): Secondary | ICD-10-CM

## 2015-02-19 DIAGNOSIS — C7800 Secondary malignant neoplasm of unspecified lung: Secondary | ICD-10-CM

## 2015-02-19 MED ORDER — ZOLPIDEM TARTRATE 10 MG PO TABS: 10.0000 mg | ORAL_TABLET | Freq: Every evening | ORAL | Status: DC | PRN

## 2015-02-19 MED ORDER — DULOXETINE HCL 30 MG PO CPEP: ORAL_CAPSULE | ORAL | Status: DC

## 2015-02-19 MED ORDER — FENTANYL 50 MCG/HR TD PT72: 50.0000 ug | MEDICATED_PATCH | TRANSDERMAL | Status: DC

## 2015-02-19 NOTE — Progress Notes (Signed)
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    09/25/2014 Imaging CT abd/pelvis- Indeterminate nodules within the bilateral lung bases with dominant approximately 1.7 cm centrally necrotic nodule within the right lower lobe...   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   10/03/2014 Tumor Marker CEA NOT ELEVATED   10/09/2014 Imaging CT Chest- Bilateral pulmonary nodules of varying size are most consistent with pulmonary metastasis. There are approximately 6 lesions in each lung.   10/17/2014 Pathology Results Rectum, biopsy - INVASIVE ADENOCARCINOMA   10/29/2014 Pathology Results Diagnosis Lung, needle/core biopsy(ies), right lower lobe - METASTATIC ADENOCARCINOMA   11/04/2014 - 12/18/2014 Radiation Therapy Dr. Lisbeth Renshaw   11/05/2014 -  Chemotherapy FOLFOX + Avastin   02/11/2015 Surgery Colostomy formation by Dr. Arnoldo Morale.         HISTORY OF PRESENTING ILLNESS:  Andrew Kline 49 y.o. male is here because of stage IV rectal cancer.  He is here alone today. He goes by [name]. He is doing a little better in regards to pain.The patient is here alone today.   The patient is having trouble with his fentanyl prescription due to the pre-authorization issues. He currently has fentanyl but at a lower dose. He notes that Dr. Arnoldo Morale increased his total dose after surgery.   He has been taking Dilaudid.   Notes this is keeping things under control.  He does not understand why he  continues to have rectal pain.  He met with our Nutritionist today and was given a case of Ensure to help his nutritional habits.  His family has been helping with his nutrition at home mixing his medications with milkshakes.  He complains of experiencing a "burning sensation" when he has gas.  His mother questions if he can take over the counter medication to help with the "gas pains".  He notes that this discomfort sometimes wakes him in the middle of the night.  He experiences the discomfort during his office visit stating that it feels as if his "intestines are moving". The patient complains of restless legs.  His mother would like the patient to have a handicapped sticker.    MEDICAL HISTORY:  Past Medical History  Diagnosis Date  . Rectal pain   . Arthritis   . Renal cell cancer ([place])     2012.   [name] Rectal cancer ([place])   . Shortness of breath dyspnea   . Anxiety   . Depression     SURGICAL HISTORY: Past Surgical History  Procedure Laterality Date  . Hypospadias correction    . Laparoscopic partial nephrectomy  2012    right side  . Knee surgery Left     arthroscopy  . Flexible sigmoidoscopy N/A 09/30/2014    Procedure: FLEXIBLE SIGMOIDOSCOPY;  Surgeon: Danie Binder, MD;  Location: [facility];  Service: Endoscopy;  Laterality: N/A;  . Esophageal biopsy N/A 09/30/2014    Procedure: BIOPSY;  Surgeon: Danie Binder, MD;  Location: [facility];  Service: Endoscopy;  Laterality: N/A;  Anal Canal  .  Flexible sigmoidoscopy N/A 10/17/2014    Procedure: FLEXIBLE SIGMOIDOSCOPY;  Surgeon: Danie Binder, MD;  Location: [facility] SU[facility];  Service: Endoscopy;  Laterality: N/A;  1325  . Lung biopsy Right   . Colostomy N/A 02/11/2015    Procedure: COLOSTOMY;  Surgeon: Aviva Signs, MD;  Location: [facility];  Service: General;  Laterality: N/A;    SOCIAL HISTORY: Social History   Social History  . Marital Status: Divorced    Spouse Name: N/A  . Number of Children: 2  . Years of Education: N/A    Occupational History  . retail    Social History Main Topics  . Smoking status: Current Every Day Smoker -- 0.50 packs/day for 30 years    Types: Cigarettes  . Smokeless tobacco: Not on file     Comment: a little over a pack daily  . Alcohol Use: 0.0 oz/week    0 Standard drinks or equivalent per week     Comment: Occ  . Drug Use: No  . Sexual Activity: Yes    Birth Control/ Protection: None   Other Topics Concern  . Not on file   Social History Narrative  He is from [place], [place]. Moved here in March. Smoker, 1 ppd. Was 1.5 ppd. ETOH, none. 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 He is divorced. Lives with fiance. 2 children. 67 and 64 yo. No grandchildren.  FAMILY HISTORY: Family History  Problem Relation Age of Onset  . Lung cancer Maternal Grandfather   . Colon cancer Maternal Uncle     age 3  . Diabetes Other   . Healthy Mother    indicated that his mother is alive. He indicated that his father is alive.   Mother living, 101 yo. Still working as a Audiological scientist. Father living, 37-70 yo. 1 brother, 1 sister, 1 step sister. Maternal uncle has colon cancer. He is married to a GI nurse. So he has people to go to with questions and is informed on what is happening. History of diabetes in the family. No history of heart disease in the family.  ALLERGIES:  has No Known Allergies.  MEDICATIONS:  Current Outpatient Prescriptions  Medication Sig Dispense Refill  . amitriptyline (ELAVIL) 50 MG tablet Take 1 tablet (50 mg total) by mouth at bedtime. 30 tablet 2  . Bevacizumab (AVASTIN IV) Inject into the vein every 14 (fourteen) days. To start November 19, 2014    . ciprofloxacin (CIPRO) 250 MG tablet Take 1 tablet twice daily for 7 days. (Patient taking differently: Take 250 mg by mouth 2 (two) times daily. ) 14 tablet 0  . citalopram (CELEXA) 40 MG tablet TAKE 1/2 TABLET BY  MOUTH FOR 5 DAYS  THEN ONE TABLET DAILY THEREAFTER. (Patient taking differently: Take 40 mg by mouth daily. ) 30 tablet 1  . dextrose 5 % SOLN 1,000 mL with fluorouracil 5 GM/100ML SOLN Inject into the vein every 14 (fourteen) days. To start November 05, 2014. To infuse over 46 hours via pump.    [name] docusate sodium (COLACE) 100 MG capsule Take 1 capsule (100 mg total) by mouth every 12 (twelve) hours. (Patient taking differently: Take 100 mg by mouth at bedtime. ) 60 capsule 0  . DULoxetine (CYMBALTA) 30 MG capsule Take one capsule daily for 5 days then 2 daily thereafter (Patient taking differently: Take 30 mg by mouth daily. ) 60 capsule 3  .  fentaNYL (DURAGESIC - DOSED MCG/HR) 50 MCG/HR Place 1 patch (50 mcg total) onto the skin every 3 (three) days. (Patient taking differently: Place 150 mcg onto the skin every 3 (three) days. ) 10 patch 0  . fentaNYL (DURAGESIC - DOSED MCG/HR) 75 MCG/HR Combine 75 mcg patch with 50 mcg patch for total of 167mg every 3 days. Apply to skin and change every 3 days 10 patch 0  . HYDROmorphone (DILAUDID) 4 MG tablet Take 1 tablet (4 mg total) by mouth every 4 (four) hours as needed for severe pain. 60 tablet 0  . leucovorin 50 MG injection Inject into the vein every 14 (fourteen) days. To start November 05, 2014    . lidocaine-hydrocortisone (ANAMANTLE) 3-1 % KIT Place 1 application rectally 2 (two) times daily. For two weeks, alternate with Rectiv. (Patient taking differently: Place 1 application rectally daily as needed. For two weeks, alternate with Rectiv.) 28 each 1  . lidocaine-prilocaine (EMLA) cream Apply a quarter size amount to port site 1 hour prior to chemo. Do not rub in. Cover with plastic wrap. 30 g 3  . nitroGLYCERIN (NITROGLYN) 2 % ointment Apply 0.5 inches topically daily. Anal area    . ondansetron (ZOFRAN ODT) 4 MG disintegrating tablet 476mODT q4 hours prn nausea/vomit 20 tablet 0  . ondansetron (ZOFRAN) 4 mg TABS tablet Take 4 tablets by mouth every 8 (eight)  hours as needed. 4 tablet 0  . OXALIPLATIN IV Inject into the vein every 14 (fourteen) days. To start November 05, 2014    . oxyCODONE-acetaminophen (PERCOCET/ROXICET) 5-325 MG tablet Take 2 tablets by mouth every 4 (four) hours as needed for severe pain. 6 tablet 0  . oxyCODONE-acetaminophen (PERCOCET/ROXICET) 5-325 MG tablet Take 2 tablets by mouth every 4 (four) hours as needed for severe pain. 30 tablet 0  . potassium chloride SA (K-DUR,KLOR-CON) 20 MEQ tablet Take 1 tablet (20 mEq total) by mouth 2 (two) times daily. 30 tablet 0  . pregabalin (LYRICA) 75 MG capsule Take 1 capsule (75 mg total) by mouth 2 (two) times daily. 60 capsule 3  . prochlorperazine (COMPAZINE) 10 MG tablet Take 1 tablet (10 mg total) by mouth every 6 (six) hours as needed for nausea or vomiting. 60 tablet 0  . tamsulosin (FLOMAX) 0.4 MG CAPS capsule Take 1 capsule (0.4 mg total) by mouth daily. 30 capsule 0   No current facility-administered medications for this visit.   Facility-Administered Medications Ordered in Other Visits  Medication Dose Route Frequency Provider Last Rate Last Dose  . 0.9 %  sodium chloride infusion   Intravenous Continuous ShPatrici RanksMD        Review of Systems  Constitutional: Positive for malaise/fatigue.  Respiratory: Negative for sputum production.   Gastrointestinal: Negative for blood in stool, diarrhea Genitourinary: Positive for rectal pain Psychiatric/Behavioral: Positive for ANXIETY.  14 point ROS was done and is otherwise as detailed above or in HPI    PHYSICAL EXAMINATION:  ECOG PERFORMANCE STATUS: 1 - Symptomatic but completely ambulatory  Filed Vitals:   02/19/15 1013  BP: 140/96  Pulse: 93  Temp: 98.1 F (36.7 C)  Resp: 20   Filed Weights   02/19/15 1013  Weight: 175 lb (79.379 kg)    Physical Exam  Constitutional: He is oriented to person, place, and time and well-developed, well-nourished, and in no distress.  HENT:  Head: Normocephalic and  atraumatic.  Nose: Nose normal.  Mouth/Throat: Oropharynx is clear and moist. No oropharyngeal exudate.  Dentures on top.  Eyes: Conjunctivae and EOM are normal. Pupils are equal, round, and reactive to light. Right eye exhibits no discharge. Left eye exhibits no discharge. No scleral icterus.  Neck: Normal range of motion. Neck supple. No tracheal deviation present. No thyromegaly present.  Cardiovascular: Normal rate, regular rhythm and normal heart sounds.  Exam reveals no gallop and no friction rub.   No murmur heard. Pulmonary/Chest: Effort normal and breath sounds normal. He has no wheezes. He has no rales.  Abdominal: Soft. Bowel sounds are normal. He exhibits no distension and no mass. There is no tenderness. There is no rebound and no guarding. Ostomy is c/d/i  Genitourinary:   Musculoskeletal: Normal range of motion. He exhibits no edema.  Lymphadenopathy:    He has no cervical adenopathy.  Neurological: He is alert and oriented to person, place, and time. He has normal reflexes. No cranial nerve deficit. Gait normal. Coordination normal.  Skin: Skin is warm and dry. No rash noted.  Psychiatric: Mood, memory, affect and judgment normal.  Nursing note and vitals reviewed.   LABORATORY DATA:  I have reviewed the data as listed Lab Results  Component Value Date   WBC 8.3 03/22/2015   HGB 14.7 03/22/2015   HCT 44.2 03/22/2015   MCV 97.8 03/22/2015   PLT 273 03/22/2015   CMP     Component Value Date/Time   NA 134* 03/22/2015 1500   K 3.6 03/22/2015 1500   CL 101 03/22/2015 1500   CO2 27 03/22/2015 1500   GLUCOSE 153* 03/22/2015 1500   BUN 5* 03/22/2015 1500   CREATININE 0.87 03/22/2015 1500   CALCIUM 8.9 03/22/2015 1500   PROT 7.6 03/22/2015 1500   ALBUMIN 4.0 03/22/2015 1500   AST 21 03/22/2015 1500   ALT 19 03/22/2015 1500   ALKPHOS 111 03/22/2015 1500   BILITOT 0.2* 03/22/2015 1500   GFRNONAA >60 03/22/2015 1500   GFRAA >60 03/22/2015 1500    ASSESSMENT &  PLAN:  STAGE IV Rectal vs. Anal carcinoma, adenocarcinoma Pulmonary metastases Rectal pain interfering with function Good PS History of partial nephrectomy 2012 at [facility] in [place] FOLFOX/AVASTIN XRT for palliation   He overall looks well. I have recommended proceeding with repeat imaging for re-staging. He will need to restart chemotherapy and we will schedule this once imaging is complete. I am going to look forward into straightening out his fentanyl prescription.   We addressed his living will. I continue to discuss smoking cessation.   I am going to have him discontinue celexa and start cymbalta. I believe this will not only be better for his ongoing depression/anxiety but perhaps help his pain issues.  Will prescribe Ambien.  Provided handicapped sticker  Order scans  We will see him back post imaging to review and set up a chemotherapy restart date.  This note was electronically signed.    This document serves as a record of services personally performed by Ancil Linsey, MD. It was created on her behalf by Janace Hoard, a trained medical scribe. The creation of this record is based on the scribe's personal observations and the provider's statements to them. This document has been checked and approved by the attending provider.  I have reviewed the above documentation for accuracy and completeness, and I agree with the above.  Kelby Fam. [name], MD

## 2015-02-19 NOTE — Patient Instructions (Addendum)
[place] at [facility] Discharge Instructions  RECOMMENDATIONS MADE BY THE CONSULTANT AND ANY TEST RESULTS WILL BE SENT TO YOUR REFERRING PHYSICIAN.  Exam and discussion today with Dr. [name]. Prescriptions for Ambien and Fentanyl patch (50 mcg). CT Scan as scheduled. Office visit as scheduled.  Thank you for choosing [place] at [facility] to provide your oncology and hematology care.  To afford each patient quality time with our provider, please arrive at least 15 minutes before your scheduled appointment time.    You need to re-schedule your appointment should you arrive 10 or more minutes late.  We strive to give you quality time with our providers, and arriving late affects you and other patients whose appointments are after yours.  Also, if you no show three or more times for appointments you may be dismissed from the clinic at the providers discretion.     Again, thank you for choosing [facility].  Our hope is that these requests will decrease the amount of time that you wait before being seen by our physicians.       _____________________________________________________________  Should you have questions after your visit to [facility], please contact our office at [phone] between the hours of 8:30 a.m. and 4:30 p.m.  Voicemails left after 4:30 p.m. will not be returned until the following business day.  For prescription refill requests, have your pharmacy contact our office.

## 2015-02-19 NOTE — Progress Notes (Signed)
Following up with pt . He had just undergone surgery for palliatuve colostomy  Contacted Pt by visiting during his appointment.    Wt Readings from Last 10 Encounters:  02/19/15 175 lb (79.379 kg)  02/11/15 182 lb (82.555 kg)  02/05/15 182 lb (82.555 kg)  01/28/15 184 lb (83.462 kg)  12/31/14 195 lb 6.4 oz (88.633 kg)  12/17/14 196 lb 3.2 oz (88.996 kg)  12/10/14 194 lb (87.998 kg)  12/03/14 201 lb (91.173 kg)  11/19/14 207 lb 9.6 oz (94.167 kg)  11/14/14 203 lb 9.6 oz (92.352 kg)   Saw pt approximately 1 week ago. Patients weight has fallen by about 7 lbs since then.  Some wt loss was expected.  Patient reports oral intake as unchanged since last discussion.  He still eats, but the meals/snacks are relatively small. Dropped off a case of Ensure. Hopefully these will help curb his wt loss.   Pt says that his biggest problem right now is pain. He states that the colostomy has helped with his discomfort and loss of bowel control. However, he still notes some severe pain when he has gas, sneezes, coughs etc. He noted severe discomfort after eating green beans yesterday. He describes his bowels as "Mushy" . This is more desirable than hard stools . He is on dulcolax.   He also states he still has mucous coming from his rectum which burns the area. He developed bed sores from his hospital stay which makes his entire back side hurt. He is very unhappy about this.   Reccommended pt consume a low fiber diet to better control his symptoms. Educated pt and family on what foods contain fiber and what foods he should consume vs which ones he should avoid. Pt reportedly loves vegetables. Reccommended eating canned vegetables or cooking them until they are very soft. Pt says that he does not have any lactose intolerance, but he was told not have dairy due to his medications. Told pt that high amounts of dairy can cause gas anyway and it would be best to avoid large amounts of these even if he was not on  the medications  Family member asked about simethicone for relief. Advised family member to verify with MD/nursing for potential interactions, but as far as I know that would be fine.   Pt and family again were receptive to recommendations.   Left  handouts titled "Gas" and "Low Fiber Diet".  Will continue to follow pt.  Burtis Junes RD, LDN Nutrition Pager: [phone] 02/19/2015 10:32 AM

## 2015-02-19 NOTE — Progress Notes (Signed)
Pt reports multiple complaints today:  C/o insomnia - wants Elavil refill; also c/o feeling "down".  He was given Rx Celexa in the past, but was unable to fill it due to cost - requesting this be re-prescribed; c/o bilateral lower extremity soreness, cramping; reports abd pain s/p colostomy; states he is having trouble getting Fentanyl patches (both the 25 mcg and 75 mcg doses) due to authorization being required by Medicaid; also c/o buttocks sore and "feel raw"; requesting assistance with finding and attaining a case manager; inquiring about extending the amount of time he receives home health care (they are currently seeing him for ostomy teaching/care).

## 2015-02-26 ENCOUNTER — Ambulatory Visit (HOSPITAL_COMMUNITY)
Admission: RE | Admit: 2015-02-26 | Discharge: 2015-02-26 | Disposition: A | Discharge status: Home / Self Care | Payer: Medicaid Other | Source: Ambulatory Visit | Attending: Hematology & Oncology | Admitting: Hematology & Oncology

## 2015-02-26 DIAGNOSIS — C20 Malignant neoplasm of rectum: Secondary | ICD-10-CM | POA: Diagnosis not present

## 2015-02-26 DIAGNOSIS — I700 Atherosclerosis of aorta: Secondary | ICD-10-CM | POA: Insufficient documentation

## 2015-02-26 DIAGNOSIS — Z08 Encounter for follow-up examination after completed treatment for malignant neoplasm: Secondary | ICD-10-CM | POA: Diagnosis not present

## 2015-02-26 DIAGNOSIS — C7800 Secondary malignant neoplasm of unspecified lung: Secondary | ICD-10-CM | POA: Diagnosis not present

## 2015-02-26 MED ORDER — IOHEXOL 300 MG/ML  SOLN
100.0000 mL | Freq: Once | INTRAMUSCULAR | Status: AC | PRN
  Administered 2015-02-26: 100 mL via INTRAVENOUS

## 2015-02-26 NOTE — Progress Notes (Signed)
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  INTERVAL HISTORY: Andrew Kline returns for followup of Stage IV rectal cancer.    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    09/25/2014 Imaging CT abd/pelvis- Indeterminate nodules within the bilateral lung bases with dominant approximately 1.7 cm centrally necrotic nodule within the right lower lobe...   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   10/03/2014 Tumor Marker CEA NOT ELEVATED   10/09/2014 Imaging CT Chest- Bilateral pulmonary nodules of varying size are most consistent with pulmonary metastasis. There are approximately 6 lesions in each lung.   10/17/2014 Pathology Results Rectum, biopsy - INVASIVE ADENOCARCINOMA   10/29/2014 Pathology Results Diagnosis Lung, needle/core biopsy(ies), right lower lobe - METASTATIC ADENOCARCINOMA   11/04/2014 - 12/18/2014 Radiation Therapy Dr. Lisbeth Kline   11/05/2014 -  Chemotherapy FOLFOX + Avastin   02/11/2015 Surgery Colostomy formation by Dr. Arnoldo Kline.   02/26/2015 Imaging CT CAP- Interval response to therapy with decrease in size of multiple bilateral pulmonary nodules. No new or progressive pulmonary metastasis.  No evidence for metastatic disease within the abdomen or pelvis.     The patient is worked up by nursing and it is reported to me that Andrew Kline does not want to see me today and would rather see Dr. Whitney Kline.  As a result, I recommended he reschedule his appointment, as she will not be seeing him today.  I moved on to my next patient and upon finishing with her, I learned that he was ok seeing me.  The nurse reports that he was upset with me  regarding the fact that he did not get his pain medication in a timely fashion and therefore, he had to pay out of pocket for his pain medication.  I explained that I did receive a call from Dr. Arnoldo Kline asking that we get the increased Fentanyl prior authorized that was prescribed by him.  Since we were not the original prescriber of this dose increase, we had to go through the prior authorization process to get this medication approved.  That takes time and I cannot change that.  "Well Dr. Whitney Kline didn't even know that was going on."  I explained that I cannot help that and it would not have changed the timing of his medication being approved with his insurance.  "Well don't you guys communicate?"  He is advised that yes, Dr. Whitney Kline and I do communicate.  To my recollection, Dr. Whitney Kline was well aware of the authorization needed for the medication as she was present when I took the call from Dr. Arnoldo Kline.  I explained to Andrew Kline that we do communicate about important patient issues, and in my opinion, waiting on insurance to prior authorize a medication, which we need to do mulitple times per day on multiple patients, is not a matter that requires large amounts of communication between Dr. Whitney Kline as most of this is done by our prior authorization specialist.  Dr. Whitney Kline reports to me at the end of Andrew Kline visit, that she recalls informing him previously about this authorization issue with his pain medication.  His daughter, or step daughter, was very snarky during  the conversation and interjecting comments that were inappropriate, ie: "Well it may not be important to you but it is to him [regarding communication mentioned above about his pain medication]."    I personally reviewed and went over radiographic studies with the patient.  The results are noted within this dictation.   He has improvement in his disease according to restaging tests.    As a result of this, no change or increase in pain  medication is indicated.  He rectal pressure continues and he is advised to be prepared for this to be an ongoing, lifelong issue.  He needs to understand that this may be a long-term issue and he should not expect resolution, but we can be hopeful for future improvement.  We will re-initiate therapy next week.  Of note, I have personally witnessed Andrew Kline at [facility] moving about well and getting in and out of his small truck without noticeable issues.  This was prior to his surgery by Dr. Arnoldo Kline though.   Past Medical History  Diagnosis Date  . Rectal pain   . Arthritis   . Renal cell cancer ([place])     2012.   [name] Rectal cancer ([place])   . Shortness of breath dyspnea   . Anxiety   . 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     has No Known Allergies.  Current Outpatient Prescriptions on File Prior to Visit  Medication Sig Dispense Refill  . amitriptyline (ELAVIL) 50 MG tablet Take 1 tablet (50 mg total) by mouth at bedtime. 30 tablet 2  . Bevacizumab (AVASTIN IV) Inject into the vein every 14 (fourteen) days. To start November 19, 2014    . citalopram (CELEXA) 40 MG tablet TAKE 1/2 TABLET BY MOUTH FOR 5 DAYS  THEN ONE TABLET DAILY THEREAFTER. (Patient not taking: Reported on 02/19/2015) 30 tablet 1  . dextrose 5 % SOLN 1,000 mL with fluorouracil 5 GM/100ML SOLN Inject into the vein every 14 (fourteen) days. To start November 05, 2014. To infuse over 46 hours via pump.    [name] docusate sodium (COLACE) 100 MG capsule Take 1 capsule (100 mg total) by mouth every 12 (twelve) hours. 60 capsule 0  . DULoxetine (CYMBALTA) 30 MG capsule Take one capsule daily for 5 days then 2 daily thereafter 60 capsule 3  . fentaNYL (DURAGESIC - DOSED MCG/HR) 50 MCG/HR Place 1 patch (50 mcg total) onto the skin every 3 (three) days. 10 patch 0  .  HYDROmorphone (DILAUDID) 4 MG tablet Take 1 tablet (4 mg total) by mouth every 4 (four) hours as needed for severe pain. 60 tablet 0  . leucovorin 50 MG injection Inject into the vein every 14 (fourteen) days. To start November 05, 2014    . lidocaine-hydrocortisone (ANAMANTLE) 3-1 % KIT Place 1 application rectally 2 (two) times daily. For two weeks, alternate with Rectiv. 28 each 1  . lidocaine-prilocaine (EMLA) cream Apply a quarter size amount to port site 1 hour prior to chemo. Do not rub in. Cover with plastic wrap. 30 g 3  . nitroGLYCERIN (NITROGLYN) 2 % ointment Apply 0.5 inches topically daily. Anal area    . ondansetron (ZOFRAN) 8 MG tablet Take 1 tablet (8 mg total) by mouth every 8 (eight) hours as needed for nausea or vomiting. 60 tablet 1  . OXALIPLATIN  IV Inject into the vein every 14 (fourteen) days. To start November 05, 2014    . prochlorperazine (COMPAZINE) 10 MG tablet Take 1 tablet (10 mg total) by mouth every 6 (six) hours as needed for nausea or vomiting. 60 tablet 0  . zolpidem (AMBIEN) 10 MG tablet Take 1 tablet (10 mg total) by mouth at bedtime as needed for sleep. 30 tablet 2   Current Facility-Administered Medications on File Prior to Visit  Medication Dose Route Frequency Provider Last Rate Last Dose  . 0.9 %  sodium chloride infusion   Intravenous Continuous Patrici Ranks, MD        Past Surgical History  Procedure Laterality Date  . Hypospadias correction    . Laparoscopic partial nephrectomy  2012    right side  . Knee surgery Left     arthroscopy  . Flexible sigmoidoscopy N/A 09/30/2014    Procedure: FLEXIBLE SIGMOIDOSCOPY;  Surgeon: Danie Binder, MD;  Location: [facility];  Service: Endoscopy;  Laterality: N/A;  . Esophageal biopsy N/A 09/30/2014    Procedure: BIOPSY;  Surgeon: Danie Binder, MD;  Location: [facility];  Service: Endoscopy;  Laterality: N/A;  Anal Canal  . Flexible sigmoidoscopy N/A 10/17/2014    Procedure: FLEXIBLE SIGMOIDOSCOPY;  Surgeon: Danie Binder, MD;  Location: [facility] SUITE;  Service: Endoscopy;  Laterality: N/A;  1325  . Lung biopsy Right   . Colostomy N/A 02/11/2015    Procedure: COLOSTOMY;  Surgeon: Aviva Signs, MD;  Location: [facility];  Service: General;  Laterality: N/A;    Denies any headaches, dizziness, double vision, fevers, chills, night sweats, nausea, vomiting, diarrhea, constipation, chest pain, heart palpitations, shortness of breath, blood in stool, black tarry stool, urinary pain, urinary burning, urinary frequency, hematuria.   PHYSICAL EXAMINATION  ECOG PERFORMANCE STATUS: 1 - Symptomatic but completely ambulatory  Filed Vitals:   02/27/15 1100  BP: 132/69  Pulse: 82  Temp: 98.1 F (36.7 C)  Resp: 20    GENERAL:alert, no distress, well nourished, well developed, comfortable, cooperative and in wheelchair with cane in hand, accompanied by his wife and two children/step-children. SKIN: skin color, texture, turgor are normal, no rashes or significant lesions HEAD: Normocephalic, No masses, lesions, tenderness or abnormalities EYES: normal, PERRLA, Conjunctiva are pink and non-injected EARS: External ears normal OROPHARYNX:lips, buccal mucosa, and tongue normal, mucous membranes are moist and poor dentition  NECK: supple, trachea midline LYMPH:  not examined BREAST:not examined LUNGS: clear to auscultation  HEART: regular rate & rhythm, no murmurs and no gallops ABDOMEN:abdomen soft, normal bowel sounds and colostomy producing loose stools. BACK: Back symmetric, no curvature., No CVA tenderness EXTREMITIES:less then 2 second capillary refill, no joint deformities, effusion, or inflammation, no skin discoloration, no cyanosis  NEURO: alert & oriented x 3 with fluent speech, no focal motor/sensory deficits   LABORATORY DATA: CBC    Component Value Date/Time   WBC 5.7 02/13/2015 0620   RBC 3.70* 02/13/2015 0620   HGB 11.7* 02/13/2015 0620   HCT 35.8* 02/13/2015 0620   PLT 264 02/13/2015 0620    MCV 96.8 02/13/2015 0620   MCH 31.6 02/13/2015 0620   MCHC 32.7 02/13/2015 0620   RDW 17.4* 02/13/2015 0620   LYMPHSABS 0.7 02/05/2015 1115   MONOABS 1.1* 02/05/2015 1115   EOSABS 0.1 02/05/2015 1115   BASOSABS 0.0 02/05/2015 1115      Chemistry      Component Value Date/Time   NA 139 02/13/2015 0620   K 4.1  02/13/2015 0620   CL 103 02/13/2015 0620   CO2 29 02/13/2015 0620   BUN <5* 02/13/2015 0620   CREATININE 0.61 02/13/2015 0620      Component Value Date/Time   CALCIUM 8.4* 02/13/2015 0620   ALKPHOS 101 01/28/2015 0954   AST 18 01/28/2015 0954   ALT 11* 01/28/2015 0954   BILITOT 0.6 01/28/2015 0954      Lab Results  Component Value Date   CEA 3.4 01/28/2015      PENDING LABS:   RADIOGRAPHIC STUDIES:  Ct Chest W Contrast  02/26/2015  CLINICAL DATA:  Rectal cancer. EXAM: CT CHEST, ABDOMEN, AND PELVIS WITH CONTRAST TECHNIQUE: Multidetector CT imaging of the chest, abdomen and pelvis was performed following the standard protocol during bolus administration of intravenous contrast. CONTRAST:  171m OMNIPAQUE IOHEXOL 300 MG/ML  SOLN COMPARISON:  10/09/2014 FINDINGS: CT CHEST FINDINGS Mediastinum/Nodes: The heart size is normal. The trachea appears patent and midline. Normal appearance of the esophagus. No axillary or supraclavicular adenopathy. Lungs/Pleura: No pleural fluid identified. 5 mm left upper lobe pulmonary nodule is unchanged, image 8 of series 6. 5 mm left lower lobe pulmonary nodule appears less solid on today's exam, image 21 of series 3. The right upper lobe pulmonary nodules have resolved in the interval. The lingular nodule measures 1 cm and appears less solid than on the previous exam when it measured 11 mm, image 36 of series 6. Significant interval improvement in dominant nodule involving the right lower lobe. On today's study this is appears mostly cystic with a 10 mm peripheral solid component remaining, image 25 of series 6. Previously this measured 17  mm. Musculoskeletal: Thoracic spondylosis noted. No aggressive lytic or sclerotic bone lesions identified. CT ABDOMEN PELVIS FINDINGS Hepatobiliary: There is no suspicious liver abnormality. The gallbladder appears normal. No biliary dilatation. Pancreas: scattered calcifications noted throughout the pancreas. No pseudocyst. No mass identified. Spleen: Negative Adrenals/Urinary Tract: The adrenal glands are negative. Normal appearance of the kidneys. No obstructive uropathy. The urinary bladder appears normal. Stomach/Bowel: The stomach appears normal. The small bowel loops have a normal course and caliber and there is no evidence for bowel obstruction. The appendix is visualized and appears normal. Left lower quadrant colostomy is present. Hartman's pouch noted. Vascular/Lymphatic: Calcified atherosclerotic disease involves the abdominal aorta. No aneurysm. No enlarged retroperitoneal or mesenteric adenopathy. No enlarged pelvic or inguinal lymph nodes. Reproductive: The prostate gland and seminal vesicles appear normal. Other: There is no ascites or focal fluid collections within the abdomen or pelvis. No peritoneal nodularity identified. Musculoskeletal: No aggressive lytic or sclerotic bone lesions. IMPRESSION: 1. Interval response to therapy with decrease in size of multiple bilateral pulmonary nodules. No new or progressive pulmonary metastasis. 2. No evidence for metastatic disease within the abdomen or pelvis. 3. Aortic atherosclerosis. Electronically Signed   By: TKerby MoorsM.D.   On: 02/26/2015 09:28   Ct Abdomen Pelvis W Contrast  02/26/2015  CLINICAL DATA:  Rectal cancer. EXAM: CT CHEST, ABDOMEN, AND PELVIS WITH CONTRAST TECHNIQUE: Multidetector CT imaging of the chest, abdomen and pelvis was performed following the standard protocol during bolus administration of intravenous contrast. CONTRAST:  1040mOMNIPAQUE IOHEXOL 300 MG/ML  SOLN COMPARISON:  10/09/2014 FINDINGS: CT CHEST FINDINGS  Mediastinum/Nodes: The heart size is normal. The trachea appears patent and midline. Normal appearance of the esophagus. No axillary or supraclavicular adenopathy. Lungs/Pleura: No pleural fluid identified. 5 mm left upper lobe pulmonary nodule is unchanged, image 8 of series 6. 5 mm left lower  lobe pulmonary nodule appears less solid on today's exam, image 21 of series 3. The right upper lobe pulmonary nodules have resolved in the interval. The lingular nodule measures 1 cm and appears less solid than on the previous exam when it measured 11 mm, image 36 of series 6. Significant interval improvement in dominant nodule involving the right lower lobe. On today's study this is appears mostly cystic with a 10 mm peripheral solid component remaining, image 25 of series 6. Previously this measured 17 mm. Musculoskeletal: Thoracic spondylosis noted. No aggressive lytic or sclerotic bone lesions identified. CT ABDOMEN PELVIS FINDINGS Hepatobiliary: There is no suspicious liver abnormality. The gallbladder appears normal. No biliary dilatation. Pancreas: scattered calcifications noted throughout the pancreas. No pseudocyst. No mass identified. Spleen: Negative Adrenals/Urinary Tract: The adrenal glands are negative. Normal appearance of the kidneys. No obstructive uropathy. The urinary bladder appears normal. Stomach/Bowel: The stomach appears normal. The small bowel loops have a normal course and caliber and there is no evidence for bowel obstruction. The appendix is visualized and appears normal. Left lower quadrant colostomy is present. Hartman's pouch noted. Vascular/Lymphatic: Calcified atherosclerotic disease involves the abdominal aorta. No aneurysm. No enlarged retroperitoneal or mesenteric adenopathy. No enlarged pelvic or inguinal lymph nodes. Reproductive: The prostate gland and seminal vesicles appear normal. Other: There is no ascites or focal fluid collections within the abdomen or pelvis. No peritoneal  nodularity identified. Musculoskeletal: No aggressive lytic or sclerotic bone lesions. IMPRESSION: 1. Interval response to therapy with decrease in size of multiple bilateral pulmonary nodules. No new or progressive pulmonary metastasis. 2. No evidence for metastatic disease within the abdomen or pelvis. 3. Aortic atherosclerosis. Electronically Signed   By: Kerby Moors M.D.   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    Oncology history updated.  I personally reviewed and went over radiographic studies with the patient.  The results are noted within this dictation.  CT imaging demonstrates a positive response to treatment.  "How many more treatments?" I responded "forever."  I provided him education regarding Stage IV disease and the role of treatment.  He is educated that the only time you stop treatment is due to complications or progressive disease.  Since he is tolerating well and experiencing a response to therapy, we will continue as planned.  Conversation took place regarding his pain medications.  See HPI for full details.  He is advised that no change in pain medications will take place as restaging tests demonstrate improvement.  He is advised that his rectal discomfort may last forever; as a result, he should be prepared for that and hope for future improvement.  Prior to his surgery 2 weeks ago, he was seen at [facility] ambulating without issues noted and getting in and out of his small truck.   Treatment plan manipulated to restart treatment next week.  Antibody plan, Avastin, deferred for a FULL of 4 weeks post-op.  Return in 3 weeks for follow-up.  As per usual, at the end of the appointment, Andrew Kline had a bought of significant pain that took his breath away.      THERAPY PLAN:  Continue treatment as planned.  All questions were  answered. The patient knows to call the clinic with any problems, questions or concerns. We can certainly see the patient much sooner if necessary.  Patient and plan discussed  with Dr. Ancil Linsey and she is in agreement with the aforementioned.   This note is electronically signed by: Doy Mince 02/27/2015 12:32 PM

## 2015-02-26 NOTE — Assessment & Plan Note (Addendum)
Stage IV rectal cancer, on systemic chemotherapy with FOLFOX + Avastin.    Oncology history updated.  I personally reviewed and went over radiographic studies with the patient.  The results are noted within this dictation.  CT imaging demonstrates a positive response to treatment.  "How many more treatments?" I responded "forever."  I provided him education regarding Stage IV disease and the role of treatment.  He is educated that the only time you stop treatment is due to complications or progressive disease.  Since he is tolerating well and experiencing a response to therapy, we will continue as planned.  Conversation took place regarding his pain medications.  See HPI for full details.  He is advised that no change in pain medications will take place as restaging tests demonstrate improvement.  He is advised that his rectal discomfort may last forever; as a result, he should be prepared for that and hope for future improvement.  Prior to his surgery 2 weeks ago, he was seen at [facility] ambulating without issues noted and getting in and out of his small truck.   Treatment plan manipulated to restart treatment next week.  Antibody plan, Avastin, deferred for a FULL of 4 weeks post-op.  Return in 3 weeks for follow-up.  As per usual, at the end of the appointment, Andrew Kline had a bought of significant pain that took his breath away.

## 2015-02-27 ENCOUNTER — Encounter (HOSPITAL_BASED_OUTPATIENT_CLINIC_OR_DEPARTMENT_OTHER): Payer: Medicaid Other | Admitting: Oncology

## 2015-02-27 ENCOUNTER — Encounter (HOSPITAL_COMMUNITY): Payer: Self-pay | Admitting: Oncology

## 2015-02-27 VITALS — BP 132/69 | HR 82 | Temp 98.1°F | Resp 20

## 2015-02-27 DIAGNOSIS — C20 Malignant neoplasm of rectum: Secondary | ICD-10-CM

## 2015-02-27 DIAGNOSIS — K6289 Other specified diseases of anus and rectum: Secondary | ICD-10-CM | POA: Diagnosis not present

## 2015-02-27 DIAGNOSIS — C7801 Secondary malignant neoplasm of right lung: Secondary | ICD-10-CM | POA: Diagnosis not present

## 2015-02-27 DIAGNOSIS — C7800 Secondary malignant neoplasm of unspecified lung: Secondary | ICD-10-CM

## 2015-02-27 NOTE — Patient Instructions (Signed)
..  [place] at [facility] Discharge Instructions  RECOMMENDATIONS MADE BY THE CONSULTANT AND ANY TEST RESULTS WILL BE SENT TO YOUR REFERRING PHYSICIAN.  Exam per T. Kefalas PA-C Chemo next week Return in 3 weeks to see Dr. [name]  Thank you for choosing [place] at [facility] to provide your oncology and hematology care.  To afford each patient quality time with our provider, please arrive at least 15 minutes before your scheduled appointment time.    You need to re-schedule your appointment should you arrive 10 or more minutes late.  We strive to give you quality time with our providers, and arriving late affects you and other patients whose appointments are after yours.  Also, if you no show three or more times for appointments you may be dismissed from the clinic at the providers discretion.     Again, thank you for choosing [facility].  Our hope is that these requests will decrease the amount of time that you wait before being seen by our physicians.       _____________________________________________________________  Should you have questions after your visit to [facility], please contact our office at [phone] between the hours of 8:30 a.m. and 4:30 p.m.  Voicemails left after 4:30 p.m. will not be returned until the following business day.  For prescription refill requests, have your pharmacy contact our office.

## 2015-03-03 ENCOUNTER — Encounter (HOSPITAL_COMMUNITY): Payer: Self-pay

## 2015-03-03 ENCOUNTER — Encounter (HOSPITAL_COMMUNITY): Payer: Medicaid Other | Attending: Hematology & Oncology

## 2015-03-03 ENCOUNTER — Encounter: Payer: Self-pay | Admitting: *Deleted

## 2015-03-03 VITALS — BP 133/78 | HR 82 | Temp 98.8°F | Resp 20 | Wt 178.0 lb

## 2015-03-03 DIAGNOSIS — Z5111 Encounter for antineoplastic chemotherapy: Secondary | ICD-10-CM

## 2015-03-03 DIAGNOSIS — C20 Malignant neoplasm of rectum: Secondary | ICD-10-CM | POA: Insufficient documentation

## 2015-03-03 DIAGNOSIS — C7800 Secondary malignant neoplasm of unspecified lung: Secondary | ICD-10-CM

## 2015-03-03 DIAGNOSIS — R918 Other nonspecific abnormal finding of lung field: Secondary | ICD-10-CM | POA: Diagnosis present

## 2015-03-03 DIAGNOSIS — C3411 Malignant neoplasm of upper lobe, right bronchus or lung: Secondary | ICD-10-CM | POA: Diagnosis not present

## 2015-03-03 LAB — CBC WITH DIFFERENTIAL/PLATELET
BASOS ABS: 0 10*3/uL (ref 0.0–0.1)
BASOS PCT: 0 %
EOS ABS: 0.3 10*3/uL (ref 0.0–0.7)
EOS PCT: 4 %
HCT: 41 % (ref 39.0–52.0)
HEMOGLOBIN: 13.3 g/dL (ref 13.0–17.0)
Lymphocytes Relative: 11 %
Lymphs Abs: 0.8 10*3/uL (ref 0.7–4.0)
MCH: 32 pg (ref 26.0–34.0)
MCHC: 32.4 g/dL (ref 30.0–36.0)
MCV: 98.6 fL (ref 78.0–100.0)
Monocytes Absolute: 0.8 10*3/uL (ref 0.1–1.0)
Monocytes Relative: 11 %
NEUTROS PCT: 74 %
Neutro Abs: 5.4 10*3/uL (ref 1.7–7.7)
PLATELETS: 314 10*3/uL (ref 150–400)
RBC: 4.16 MIL/uL — AB (ref 4.22–5.81)
RDW: 16.6 % — ABNORMAL HIGH (ref 11.5–15.5)
WBC: 7.2 10*3/uL (ref 4.0–10.5)

## 2015-03-03 LAB — COMPREHENSIVE METABOLIC PANEL
ALBUMIN: 3.6 g/dL (ref 3.5–5.0)
ALK PHOS: 96 U/L (ref 38–126)
ALT: 16 U/L — AB (ref 17–63)
AST: 18 U/L (ref 15–41)
Anion gap: 9 (ref 5–15)
BUN: 9 mg/dL (ref 6–20)
CALCIUM: 9.3 mg/dL (ref 8.9–10.3)
CHLORIDE: 103 mmol/L (ref 101–111)
CO2: 28 mmol/L (ref 22–32)
CREATININE: 0.77 mg/dL (ref 0.61–1.24)
GFR calc Af Amer: >60 mL/min (ref >60)
GFR calc non Af Amer: >60 mL/min (ref >60)
GLUCOSE: 149 mg/dL — AB (ref 65–99)
Potassium: 3.6 mmol/L (ref 3.5–5.1)
SODIUM: 140 mmol/L (ref 135–145)
Total Bilirubin: 0.5 mg/dL (ref 0.3–1.2)
Total Protein: 6.9 g/dL (ref 6.5–8.1)

## 2015-03-03 LAB — URINALYSIS, ROUTINE W REFLEX MICROSCOPIC
Glucose, UA: NEGATIVE mg/dL
HGB URINE DIPSTICK: NEGATIVE
Ketones, ur: NEGATIVE mg/dL
NITRITE: NEGATIVE
Protein, ur: NEGATIVE mg/dL
SPECIFIC GRAVITY, URINE: 1.025 (ref 1.005–1.030)
UROBILINOGEN UA: 0.2 mg/dL (ref 0.0–1.0)
pH: 6 (ref 5.0–8.0)

## 2015-03-03 LAB — URINALYSIS, DIPSTICK ONLY
Glucose, UA: NEGATIVE mg/dL
HGB URINE DIPSTICK: NEGATIVE
Ketones, ur: NEGATIVE mg/dL
NITRITE: NEGATIVE
PH: 6 (ref 5.0–8.0)
Protein, ur: NEGATIVE mg/dL
SPECIFIC GRAVITY, URINE: 1.025 (ref 1.005–1.030)
Urobilinogen, UA: 0.2 mg/dL (ref 0.0–1.0)

## 2015-03-03 LAB — URINE MICROSCOPIC-ADD ON

## 2015-03-03 MED ORDER — SODIUM CHLORIDE 0.9 % IV SOLN: 2400.0000 mg/m2 | INTRAVENOUS | Status: DC

## 2015-03-03 MED ORDER — SODIUM CHLORIDE 0.9 % IV SOLN
2400.0000 mg/m2 | INTRAVENOUS | Status: DC
  Administered 2015-03-03: 4650 mg via INTRAVENOUS
  Filled 2015-03-03: qty 93

## 2015-03-03 MED ORDER — SODIUM CHLORIDE 0.9 % IV SOLN
Freq: Once | INTRAVENOUS | Status: AC
  Administered 2015-03-03: 11:00:00 via INTRAVENOUS
  Filled 2015-03-03: qty 4

## 2015-03-03 MED ORDER — LEUCOVORIN CALCIUM INJECTION 350 MG: 400.0000 mg/m2 | Freq: Once | INTRAVENOUS | Status: DC

## 2015-03-03 MED ORDER — DEXTROSE 5 % IV SOLN
85.0000 mg/m2 | Freq: Once | INTRAVENOUS | Status: AC
  Administered 2015-03-03: 165 mg via INTRAVENOUS
  Filled 2015-03-03: qty 33

## 2015-03-03 MED ORDER — SODIUM CHLORIDE 0.9 % IJ SOLN
10.0000 mL | INTRAMUSCULAR | Status: DC | PRN
  Administered 2015-03-03: 10 mL
  Filled 2015-03-03: qty 10

## 2015-03-03 MED ORDER — DEXTROSE 5 % IV SOLN
400.0000 mg/m2 | Freq: Once | INTRAVENOUS | Status: AC
  Administered 2015-03-03: 776 mg via INTRAVENOUS
  Filled 2015-03-03: qty 38.8

## 2015-03-03 MED ORDER — OXALIPLATIN CHEMO INJECTION 100 MG/20ML: 85.0000 mg/m2 | Freq: Once | INTRAVENOUS | Status: DC

## 2015-03-03 MED ORDER — DEXTROSE 5 % IV SOLN
Freq: Once | INTRAVENOUS | Status: AC
  Administered 2015-03-03: 11:00:00 via INTRAVENOUS

## 2015-03-03 NOTE — Patient Instructions (Signed)
[facility] Discharge Instructions for Patients Receiving Chemotherapy  Today you received the following chemotherapy agents:  oxaliplatin and leucovorin  If you develop nausea and vomiting, or diarrhea that is not controlled by your medication, call the clinic.  The clinic phone number is [phone]. Office hours are Monday-Friday 8:30am-5:00pm.  BELOW ARE SYMPTOMS THAT SHOULD BE REPORTED IMMEDIATELY:  *FEVER GREATER THAN 101.0 F  *CHILLS WITH OR WITHOUT FEVER  NAUSEA AND VOMITING THAT IS NOT CONTROLLED WITH YOUR NAUSEA MEDICATION  *UNUSUAL SHORTNESS OF BREATH  *UNUSUAL BRUISING OR BLEEDING  TENDERNESS IN MOUTH AND THROAT WITH OR WITHOUT PRESENCE OF ULCERS  *URINARY PROBLEMS  *BOWEL PROBLEMS  UNUSUAL RASH Items with * indicate a potential emergency and should be followed up as soon as possible. If you have an emergency after office hours please contact your primary care physician or go to the nearest emergency department.  Please call the clinic during office hours if you have any questions or concerns.   You may also contact the Patient Navigator at [phone] should you have any questions or need assistance in obtaining follow up care. _____________________________________________________________________ Have you asked about our STAR program?    STAR stands for Survivorship Training and Rehabilitation, and this is a nationally recognized cancer care program that focuses on survivorship and rehabilitation.  Cancer and cancer treatments may cause problems, such as, pain, making you feel tired and keeping you from doing the things that you need or want to do. Cancer rehabilitation can help. Our goal is to reduce these troubling effects and help you have the best quality of life possible.  You may receive a survey from a nurse that asks questions about your current state of health.  Based on the survey results, all eligible patients will be referred to the  [facility] program for an evaluation so we can better serve you! A frequently asked questions sheet is available upon request.

## 2015-03-03 NOTE — Progress Notes (Signed)
[place] Clinical Social Work  Clinical Social Work was referred by [place] rounding to reassess needs due previous financial concerns. Clinical Social Worker met with pt during chemo and he reports to be doing well. He recently got married and is considering moving back to [place] state by February in order to be closer to family. He reports good support from family that came down during his surgery. He has been receiving home health visits for wound care. He reports to have concerns about ostomy supplies that [facility] is currently providing. CSW informed RN navigator for follow up. Overall, he seemed in better spirits and is still getting some assistance from [employer]. He agrees to reach out as needed.   Clinical Social Work interventions: Supportive listening and emotional support.    Loren Racer, [place] Tuesdays   [phone]

## 2015-03-03 NOTE — Progress Notes (Signed)
Tolerated tx w/o adverse reaction.  VSS.  A&Ox4, in no distress.  Discharged via wheelchair in c/o spouse for transport home.

## 2015-03-04 LAB — CEA: CEA: 2.7 ng/mL (ref 0.0–4.7)

## 2015-03-05 ENCOUNTER — Encounter (HOSPITAL_BASED_OUTPATIENT_CLINIC_OR_DEPARTMENT_OTHER): Payer: Medicaid Other

## 2015-03-05 ENCOUNTER — Encounter (HOSPITAL_COMMUNITY): Payer: Self-pay

## 2015-03-05 VITALS — BP 126/61 | HR 100 | Temp 98.3°F

## 2015-03-05 DIAGNOSIS — C20 Malignant neoplasm of rectum: Secondary | ICD-10-CM

## 2015-03-05 DIAGNOSIS — Z452 Encounter for adjustment and management of vascular access device: Secondary | ICD-10-CM | POA: Diagnosis present

## 2015-03-05 DIAGNOSIS — C7801 Secondary malignant neoplasm of right lung: Secondary | ICD-10-CM

## 2015-03-05 DIAGNOSIS — C7800 Secondary malignant neoplasm of unspecified lung: Principal | ICD-10-CM

## 2015-03-05 MED ORDER — HEPARIN SOD (PORK) LOCK FLUSH 100 UNIT/ML IV SOLN
500.0000 [IU] | Freq: Once | INTRAVENOUS | Status: AC | PRN
  Administered 2015-03-05: 500 [IU]

## 2015-03-05 MED ORDER — HEPARIN SOD (PORK) LOCK FLUSH 100 UNIT/ML IV SOLN
INTRAVENOUS | Status: AC
  Filled 2015-03-05: qty 5

## 2015-03-05 MED ORDER — SODIUM CHLORIDE 0.9 % IJ SOLN
10.0000 mL | INTRAMUSCULAR | Status: DC | PRN
  Administered 2015-03-05: 10 mL
  Filled 2015-03-05: qty 10

## 2015-03-05 NOTE — Progress Notes (Signed)
1215:  Presents to have home infusion pump d/c'd and for port-a-cath deaccess and flush.  Pump d/c'd and Portacath flushed with NS and 500U/77m Heparin, and needle removed intact.  Procedure tolerated well and without incident.

## 2015-03-05 NOTE — Patient Instructions (Signed)
[place] at [facility] Discharge Instructions  RECOMMENDATIONS MADE BY THE CONSULTANT AND ANY TEST RESULTS WILL BE SENT TO YOUR REFERRING PHYSICIAN.  Pump removal and port flush today. Return as scheduled for chemotherapy and office visit.   Thank you for choosing [place] at [facility] to provide your oncology and hematology care.  To afford each patient quality time with our provider, please arrive at least 15 minutes before your scheduled appointment time.    You need to re-schedule your appointment should you arrive 10 or more minutes late.  We strive to give you quality time with our providers, and arriving late affects you and other patients whose appointments are after yours.  Also, if you no show three or more times for appointments you may be dismissed from the clinic at the providers discretion.     Again, thank you for choosing [facility].  Our hope is that these requests will decrease the amount of time that you wait before being seen by our physicians.       _____________________________________________________________  Should you have questions after your visit to [facility] [place], please contact our office at [phone] between the hours of 8:30 a.m. and 4:30 p.m.  Voicemails left after 4:30 p.m. will not be returned until the following business day.  For prescription refill requests, have your pharmacy contact our office.

## 2015-03-09 ENCOUNTER — Other Ambulatory Visit (HOSPITAL_COMMUNITY): Payer: Self-pay | Admitting: Oncology

## 2015-03-09 ENCOUNTER — Telehealth (HOSPITAL_COMMUNITY): Payer: Self-pay | Admitting: *Deleted

## 2015-03-09 DIAGNOSIS — C20 Malignant neoplasm of rectum: Secondary | ICD-10-CM

## 2015-03-09 DIAGNOSIS — C7800 Secondary malignant neoplasm of unspecified lung: Secondary | ICD-10-CM

## 2015-03-09 MED ORDER — HYDROMORPHONE HCL 4 MG PO TABS: 4.0000 mg | ORAL_TABLET | ORAL | Status: DC | PRN

## 2015-03-11 NOTE — Progress Notes (Signed)
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    09/25/2014 Imaging CT abd/pelvis- Indeterminate nodules within the bilateral lung bases with dominant approximately 1.7 cm centrally necrotic nodule within the right lower lobe...   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   10/03/2014 Tumor Marker CEA NOT ELEVATED   10/09/2014 Imaging CT Chest- Bilateral pulmonary nodules of varying size are most consistent with pulmonary metastasis. There are approximately 6 lesions in each lung.   10/17/2014 Pathology Results Rectum, biopsy - INVASIVE ADENOCARCINOMA   10/29/2014 Pathology Results Diagnosis Lung, needle/core biopsy(ies), right lower lobe - METASTATIC ADENOCARCINOMA   11/04/2014 - 12/18/2014 Radiation Therapy Dr. Lisbeth Renshaw   11/05/2014 -  Chemotherapy FOLFOX + Avastin   02/11/2015 Surgery Colostomy formation by Dr. Arnoldo Morale.   02/26/2015 Imaging CT CAP- Interval response to therapy with decrease in size of multiple bilateral pulmonary nodules. No new or progressive pulmonary metastasis.  No evidence for metastatic disease within the abdomen or pelvis.          HISTORY OF PRESENTING ILLNESS:  Andrew Kline 49 y.o. male is here because of stage IV rectal cancer.  He is here alone today. He goes by [name].   He has undergone surgery and notes that he is pleased with "the bag" because he no longer has no worry about bowel control. He still complains of pelvic pain.  We have  made multiple changes to his pain medication regimen. He notes he has a lot of sinus congestion.  He denies cough or fever.  He describes his appetite as good. He still has difficulty with sleep. Some nights he can sleep fairly well but most nights his sleep is very interrupted.    He is reporting some dysuria and urinary odor. Imaging studies on 10/27 show response to therapy, He has mild to no neuropathy complaints in the fingers/toes.   MEDICAL HISTORY:  Past Medical History  Diagnosis Date  . Rectal pain   . Arthritis   . Renal cell cancer ([place])     2012.   [name] Rectal cancer ([employer])   . Shortness of breath dyspnea   . Anxiety   . Depression   . Kidney stone 03/22/15    left    SURGICAL HISTORY: Past Surgical History  Procedure Laterality Date  . Hypospadias correction    . Laparoscopic partial nephrectomy  2012    right side  . Knee surgery Left     arthroscopy  . Flexible sigmoidoscopy N/A 09/30/2014    Procedure: FLEXIBLE SIGMOIDOSCOPY;  Surgeon: Danie Binder, MD;  Location: [facility];  Service: Endoscopy;  Laterality: N/A;  . Esophageal biopsy N/A 09/30/2014    Procedure: BIOPSY;  Surgeon: Danie Binder, MD;  Location: [facility]S;  Service: Endoscopy;  Laterality: N/A;  Anal Canal  . Flexible sigmoidoscopy N/A 10/17/2014    Procedure: FLEXIBLE SIGMOIDOSCOPY;  Surgeon: Danie Binder, MD;  Location: [facility] END[facility] SUITE;  Service: Endoscopy;  Laterality: N/A;  1325  . Lung biopsy Right   .  Colostomy N/A 02/11/2015    Procedure: COLOSTOMY;  Surgeon: Aviva Signs, MD;  Location: [facility] ORS;  Service: General;  Laterality: N/A;    SOCIAL HISTORY: Social History   Social History  . Marital Status: Divorced    Spouse Name: N/A  . Number of Children: 2  . Years of Education: N/A   Occupational History  . retail    Social History Main Topics  . Smoking status: Current Every Day Smoker -- 0.50 packs/day for 30 years    Types: Cigarettes  . Smokeless tobacco: Not on file     Comment: a  little over a pack daily  . Alcohol Use: 0.0 oz/week    0 Standard drinks or equivalent per week     Comment: Occ  . Drug Use: No  . Sexual Activity: Yes    Birth Control/ Protection: None   Other Topics Concern  . Not on file   Social History Narrative  He is from [place], [place]. Moved here in March. Smoker, 1 ppd. Was 1.5 ppd. ETOH, none. 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 He is divorced. Lives with fiance. 2 children. 57 and 36 yo. No grandchildren.  FAMILY HISTORY: Family History  Problem Relation Age of Onset  . Lung cancer Maternal Grandfather   . Colon cancer Maternal Uncle     age 69  . Diabetes Other   . Healthy Mother    indicated that his mother is alive. He indicated that his father is alive.   Mother living, 70 yo. Still working as a Audiological scientist. Father living, 15-70 yo. 1 brother, 1 sister, 1 step sister. Maternal uncle has colon cancer. He is married to a GI nurse. So he has people to go to with questions and is informed on what is happening. History of diabetes in the family. No history of heart disease in the family.  ALLERGIES:  has No Known Allergies.  MEDICATIONS:  Current Outpatient Prescriptions  Medication Sig Dispense Refill  . amitriptyline (ELAVIL) 50 MG tablet Take 1 tablet (50 mg total) by mouth at bedtime. 30 tablet 2  . citalopram (CELEXA) 40 MG tablet TAKE 1/2 TABLET BY MOUTH FOR 5 DAYS  THEN ONE TABLET DAILY THEREAFTER. (Patient taking differently: Take 40 mg by mouth daily. ) 30 tablet 1  . docusate sodium (COLACE) 100 MG capsule Take 1 capsule (100 mg total) by mouth every 12 (twelve) hours. (Patient taking differently: Take 100 mg by mouth at bedtime. ) 60 capsule 0  . DULoxetine (CYMBALTA) 30 MG capsule Take one capsule daily for 5 days then 2 daily thereafter (Patient taking differently: Take 30 mg by mouth daily. ) 60 capsule 3  .  lidocaine-hydrocortisone (ANAMANTLE) 3-1 % KIT Place 1 application rectally 2 (two) times daily. For two weeks, alternate with Rectiv. (Patient not taking: Reported on 03/31/2015) 28 each 1  . lidocaine-prilocaine (EMLA) cream Apply a quarter size amount to port site 1 hour prior to chemo. Do not rub in. Cover with plastic wrap. 30 g 3  . nitroGLYCERIN (NITROGLYN) 2 % ointment Apply 0.5 inches topically daily. Anal area    . prochlorperazine (COMPAZINE) 10 MG tablet Take 1 tablet (10 mg total) by mouth every 6 (six) hours as needed for nausea or vomiting. (Patient not taking: Reported on 04/14/2015) 60 tablet 0  . Bevacizumab (AVASTIN IV) Inject into the vein every 14 (fourteen) days. To start November 19, 2014    . dextrose 5 % SOLN 1,000 mL with fluorouracil 5 GM/100ML SOLN Inject into the vein every 14 (fourteen) days. To start November 05, 2014. To infuse over 46 hours via pump.    . fentaNYL (DURAGESIC - DOSED MCG/HR) 75 MCG/HR Place 2 patches (150 mcg total) onto the skin every 3 (three) days. 10 patch 0  . HYDROmorphone (DILAUDID) 4 MG tablet Take 1 tablet (4 mg total) by mouth every 4 (four) hours as needed for severe pain. 60 tablet 0  . leucovorin 50 MG injection Inject into the vein every 14 (fourteen) days. To start November 05, 2014    . ondansetron (ZOFRAN ODT) 4 MG disintegrating tablet 51m ODT q4 hours prn nausea/vomit (Patient not taking: Reported on 03/31/2015) 20 tablet 0  . ondansetron (ZOFRAN) 4 mg TABS tablet Take 4 tablets by mouth every 8 (eight) hours as needed. 4 tablet 0  . OXALIPLATIN IV Inject into the vein every 14 (fourteen) days. To start November 05, 2014    . potassium chloride SA (K-DUR,KLOR-CON) 20 MEQ tablet Take 1 tablet (20 mEq total) by mouth 2 (two) times daily. (Patient not taking: Reported on 03/31/2015) 30 tablet 0  . pregabalin (LYRICA) 150 MG capsule Take 1 capsule (150 mg total) by mouth 2 (two) times daily. 60 capsule 0  . tamsulosin (FLOMAX) 0.4 MG CAPS capsule Take 1  capsule (0.4 mg total) by mouth daily. (Patient not taking: Reported on 04/14/2015) 30 capsule 0   No current facility-administered medications for this visit.   Facility-Administered Medications Ordered in Other Visits  Medication Dose Route Frequency Provider Last Rate Last Dose  . 0.9 %  sodium chloride infusion   Intravenous Continuous SPatrici Ranks MD        Review of Systems  Constitutional: Positive for malaise/fatigue.  Respiratory: Negative for sputum production.   Gastrointestinal: Negative for blood in stool, diarrhea Genitourinary: Positive for rectal/pelvic pain. Psychiatric/Behavioral: The patient is not nervous/anxious.   GU: dysuria and odor to urine  14 point ROS was done and is otherwise as detailed above or in HPI   PHYSICAL EXAMINATION:  ECOG PERFORMANCE STATUS: 1 - Symptomatic but completely ambulatory  Filed Vitals:   03/12/15 1309  BP: 145/80  Pulse: 99  Temp: 98.8 F (37.1 C)  Resp: 20   Filed Weights   03/12/15 1309  Weight: 177 lb 4.8 oz (80.423 kg)    Physical Exam  Constitutional: He is oriented to person, place, and time and well-developed, well-nourished, and in no distress.  Sitting upright. HENT:  Head: Normocephalic and atraumatic.  Nose: Nose normal.  Mouth/Throat: Oropharynx is clear and moist. No oropharyngeal exudate.  Dentures on top.  Eyes: Conjunctivae and EOM are normal. Pupils are equal, round, and reactive to light. Right eye exhibits no discharge. Left eye exhibits no discharge. No scleral icterus.  Neck: Normal range of motion. Neck supple. No tracheal deviation present. No thyromegaly present.  Cardiovascular: Normal rate, regular rhythm and normal heart sounds.  Exam reveals no gallop and no friction rub.   No murmur heard. Pulmonary/Chest: Effort normal and breath sounds normal. He has no wheezes. He has no rales.  Abdominal: Soft. Bowel sounds are normal. He exhibits no distension and no mass. There is no  tenderness. There is no rebound and no guarding.  Genitourinary:   Musculoskeletal: Normal range of motion. He exhibits no edema.  Lymphadenopathy:    He has no cervical adenopathy.  Neurological: He is alert  and oriented to person, place, and time. He has normal reflexes. No cranial nerve deficit. Gait normal. Coordination normal.  Skin: Skin is warm and dry. No rash noted.  Psychiatric: Mood, memory, affect and judgment normal.  Nursing note and vitals reviewed.   LABORATORY DATA:  I have reviewed the data as listed Lab Results  Component Value Date   WBC 6.2 04/14/2015   HGB 12.6* 04/14/2015   HCT 37.9* 04/14/2015   MCV 98.2 04/14/2015   PLT 223 04/14/2015   CMP     Component Value Date/Time   NA 139 04/14/2015 0857   K 3.5 04/14/2015 0857   CL 99* 04/14/2015 0857   CO2 29 04/14/2015 0857   GLUCOSE 159* 04/14/2015 0857   BUN 7 04/14/2015 0857   CREATININE 0.76 04/14/2015 0857   CALCIUM 8.9 04/14/2015 0857   PROT 6.7 04/14/2015 0857   ALBUMIN 3.4* 04/14/2015 0857   AST 21 04/14/2015 0857   ALT 15* 04/14/2015 0857   ALKPHOS 97 04/14/2015 0857   BILITOT 0.2* 04/14/2015 0857   GFRNONAA >60 04/14/2015 0857   GFRAA >60 04/14/2015 0857   RADIOLOGY: I have personally reviewed the radiological images as listed and agreed with the findings in the report.  CLINICAL DATA: Rectal cancer.  EXAM: CT CHEST, ABDOMEN, AND PELVIS WITH CONTRAST  TECHNIQUE: Multidetector CT imaging of the chest, abdomen and pelvis was performed following the standard protocol during bolus administration of intravenous contrast.  CONTRAST: 164m OMNIPAQUE IOHEXOL 300 MG/ML SOLN  COMPARISON: 10/09/2014  FINDINGS: CT CHEST FINDINGS  Mediastinum/Nodes: The heart size is normal. The trachea appears patent and midline. Normal appearance of the esophagus. No axillary or supraclavicular adenopathy.  Lungs/Pleura: No pleural fluid identified. 5 mm left upper lobe pulmonary nodule is  unchanged, image 8 of series 6. 5 mm left lower lobe pulmonary nodule appears less solid on today's exam, image 21 of series 3. The right upper lobe pulmonary nodules have resolved in the interval. The lingular nodule measures 1 cm and appears less solid than on the previous exam when it measured 11 mm, image 36 of series 6. Significant interval improvement in dominant nodule involving the right lower lobe. On today's study this is appears mostly cystic with a 10 mm peripheral solid component remaining, image 25 of series 6. Previously this measured 17 mm.  Musculoskeletal: Thoracic spondylosis noted. No aggressive lytic or sclerotic bone lesions identified.  CT ABDOMEN PELVIS FINDINGS  Hepatobiliary: There is no suspicious liver abnormality. The gallbladder appears normal. No biliary dilatation.  Pancreas: scattered calcifications noted throughout the pancreas. No pseudocyst. No mass identified.  Spleen: Negative  Adrenals/Urinary Tract: The adrenal glands are negative. Normal appearance of the kidneys. No obstructive uropathy. The urinary bladder appears normal.  Stomach/Bowel: The stomach appears normal. The small bowel loops have a normal course and caliber and there is no evidence for bowel obstruction. The appendix is visualized and appears normal. Left lower quadrant colostomy is present. Hartman's pouch noted.  Vascular/Lymphatic: Calcified atherosclerotic disease involves the abdominal aorta. No aneurysm. No enlarged retroperitoneal or mesenteric adenopathy. No enlarged pelvic or inguinal lymph nodes.  Reproductive: The prostate gland and seminal vesicles appear normal.  Other: There is no ascites or focal fluid collections within the abdomen or pelvis. No peritoneal nodularity identified.  Musculoskeletal: No aggressive lytic or sclerotic bone lesions.  IMPRESSION: 1. Interval response to therapy with decrease in size of multiple bilateral pulmonary  nodules. No new or progressive pulmonary metastasis. 2. No evidence for metastatic  disease within the abdomen or pelvis. 3. Aortic atherosclerosis.   Electronically Signed  By: Kerby Moors M.D.  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 Arnoldo Morale Weight Loss   He is ready to resume therapy and we will plan on restarting next week.  He has had significant weight loss since May but it has stabilized over the last few months. Appetite seems to be improving   I have ordered a U/A today for his urinary complaints and will notify him once results are available.   I have added lyrica to his pain regimen.  I advised him that I feel some of his pain is neuropathic and that the combination of fentanyl and lyrica may significantly improve his symptoms.  He is agreeable to try.  Pain medications were refilled. All of his questions were addressed today.  We addressed his living will. I continue to discuss smoking cessation.   This note was electronically signed.   Kelby Fam. [name], MD

## 2015-03-12 ENCOUNTER — Encounter (HOSPITAL_BASED_OUTPATIENT_CLINIC_OR_DEPARTMENT_OTHER): Payer: Medicaid Other | Admitting: Hematology & Oncology

## 2015-03-12 ENCOUNTER — Other Ambulatory Visit (HOSPITAL_COMMUNITY): Payer: Self-pay

## 2015-03-12 VITALS — BP 145/80 | HR 99 | Temp 98.8°F | Resp 20 | Wt 177.3 lb

## 2015-03-12 DIAGNOSIS — R300 Dysuria: Secondary | ICD-10-CM

## 2015-03-12 DIAGNOSIS — C20 Malignant neoplasm of rectum: Secondary | ICD-10-CM

## 2015-03-12 DIAGNOSIS — R918 Other nonspecific abnormal finding of lung field: Secondary | ICD-10-CM | POA: Diagnosis not present

## 2015-03-12 DIAGNOSIS — G893 Neoplasm related pain (acute) (chronic): Secondary | ICD-10-CM | POA: Diagnosis not present

## 2015-03-12 DIAGNOSIS — N390 Urinary tract infection, site not specified: Secondary | ICD-10-CM

## 2015-03-12 DIAGNOSIS — C7801 Secondary malignant neoplasm of right lung: Secondary | ICD-10-CM | POA: Diagnosis not present

## 2015-03-12 LAB — URINALYSIS, ROUTINE W REFLEX MICROSCOPIC
BILIRUBIN URINE: NEGATIVE
Glucose, UA: NEGATIVE mg/dL
Hgb urine dipstick: NEGATIVE
Ketones, ur: NEGATIVE mg/dL
NITRITE: NEGATIVE
Protein, ur: NEGATIVE mg/dL
SPECIFIC GRAVITY, URINE: 1.025 (ref 1.005–1.030)
UROBILINOGEN UA: 0.2 mg/dL (ref 0.0–1.0)
pH: 5.5 (ref 5.0–8.0)

## 2015-03-12 LAB — URINE MICROSCOPIC-ADD ON

## 2015-03-12 MED ORDER — FENTANYL 75 MCG/HR TD PT72: MEDICATED_PATCH | TRANSDERMAL | Status: DC

## 2015-03-12 MED ORDER — PREGABALIN 75 MG PO CAPS: 75.0000 mg | ORAL_CAPSULE | Freq: Two times a day (BID) | ORAL | Status: DC

## 2015-03-12 MED ORDER — CIPROFLOXACIN HCL 250 MG PO TABS: ORAL_TABLET | ORAL | Status: DC

## 2015-03-12 NOTE — Patient Instructions (Signed)
[place] at [facility] Discharge Instructions  RECOMMENDATIONS MADE BY THE CONSULTANT AND ANY TEST RESULTS WILL BE SENT TO YOUR REFERRING PHYSICIAN.  Exam per Dr.Penland. Urinalysis today. Return as scheduled for chemo.  Thank you for choosing [place] at [facility] to provide your oncology and hematology care.  To afford each patient quality time with our provider, please arrive at least 15 minutes before your scheduled appointment time.    You need to re-schedule your appointment should you arrive 10 or more minutes late.  We strive to give you quality time with our providers, and arriving late affects you and other patients whose appointments are after yours.  Also, if you no show three or more times for appointments you may be dismissed from the clinic at the providers discretion.     Again, thank you for choosing [facility].  Our hope is that these requests will decrease the amount of time that you wait before being seen by our physicians.       _____________________________________________________________  Should you have questions after your visit to [facility], please contact our office at [phone] between the hours of 8:30 a.m. and 4:30 p.m.  Voicemails left after 4:30 p.m. will not be returned until the following business day.  For prescription refill requests, have your pharmacy contact our office.

## 2015-03-13 ENCOUNTER — Encounter: Payer: Self-pay | Admitting: Dietician

## 2015-03-13 NOTE — Progress Notes (Signed)
Advised by nursing that pt had requested his second case of Ensure.   Contacted Pt by Phone  Wt Readings from Last 10 Encounters:  03/12/15 177 lb 4.8 oz (80.423 kg)  03/03/15 178 lb (80.74 kg)  02/19/15 175 lb (79.379 kg)  02/11/15 182 lb (82.555 kg)  02/05/15 182 lb (82.555 kg)  01/28/15 184 lb (83.462 kg)  12/31/14 195 lb 6.4 oz (88.633 kg)  12/17/14 196 lb 3.2 oz (88.996 kg)  12/10/14 194 lb (87.998 kg)  12/03/14 201 lb (91.173 kg)  Patient weight has been stable for 1 month  Patient reports oral intake as good. He never really had any issues with his appetite and has been a good eater since I first spoke with him.    Currently he is still suffering from abdominal pain and some gas. He is slowly learning which foods and what amounts cause him discomfort  He stated that he would like another case of Ensure. Pt asked for "coffee or mocha flavored supplement". I am not sure that he really understands the purpose of the Ensure Plus is to provide people who either have poor PO intake or have very high energy/pro requirements with the nutrition they require.  They are not meant to be consumed as a common retail beverage. He was okay with butter pecan.   Pt seems to be doing better overall.  Will continue to monitor.   Burtis Junes RD, LDN Nutrition Pager: [phone] 03/13/2015 3:34 PM

## 2015-03-17 ENCOUNTER — Encounter: Payer: Self-pay | Admitting: Dietician

## 2015-03-17 ENCOUNTER — Ambulatory Visit (HOSPITAL_COMMUNITY): Payer: Medicaid Other | Admitting: Oncology

## 2015-03-17 ENCOUNTER — Other Ambulatory Visit (HOSPITAL_COMMUNITY): Payer: Self-pay | Admitting: Oncology

## 2015-03-17 ENCOUNTER — Encounter (HOSPITAL_BASED_OUTPATIENT_CLINIC_OR_DEPARTMENT_OTHER): Payer: Medicaid Other

## 2015-03-17 VITALS — BP 147/78 | HR 84 | Temp 98.0°F | Resp 18 | Wt 179.0 lb

## 2015-03-17 DIAGNOSIS — C7800 Secondary malignant neoplasm of unspecified lung: Principal | ICD-10-CM

## 2015-03-17 DIAGNOSIS — Z5112 Encounter for antineoplastic immunotherapy: Secondary | ICD-10-CM

## 2015-03-17 DIAGNOSIS — C20 Malignant neoplasm of rectum: Secondary | ICD-10-CM | POA: Diagnosis not present

## 2015-03-17 DIAGNOSIS — Z5111 Encounter for antineoplastic chemotherapy: Secondary | ICD-10-CM | POA: Diagnosis not present

## 2015-03-17 DIAGNOSIS — E876 Hypokalemia: Secondary | ICD-10-CM

## 2015-03-17 DIAGNOSIS — C7801 Secondary malignant neoplasm of right lung: Secondary | ICD-10-CM | POA: Diagnosis not present

## 2015-03-17 DIAGNOSIS — R918 Other nonspecific abnormal finding of lung field: Secondary | ICD-10-CM | POA: Diagnosis not present

## 2015-03-17 LAB — CBC WITH DIFFERENTIAL/PLATELET
BASOS ABS: 0 10*3/uL (ref 0.0–0.1)
BASOS PCT: 0 %
EOS ABS: 0.2 10*3/uL (ref 0.0–0.7)
EOS PCT: 3 %
HCT: 37.5 % — ABNORMAL LOW (ref 39.0–52.0)
Hemoglobin: 12.5 g/dL — ABNORMAL LOW (ref 13.0–17.0)
LYMPHS PCT: 10 %
Lymphs Abs: 0.8 10*3/uL (ref 0.7–4.0)
MCH: 33.4 pg (ref 26.0–34.0)
MCHC: 33.3 g/dL (ref 30.0–36.0)
MCV: 100.3 fL — ABNORMAL HIGH (ref 78.0–100.0)
Monocytes Absolute: 1 10*3/uL (ref 0.1–1.0)
Monocytes Relative: 12 %
Neutro Abs: 6 10*3/uL (ref 1.7–7.7)
Neutrophils Relative %: 75 %
PLATELETS: 238 10*3/uL (ref 150–400)
RBC: 3.74 MIL/uL — AB (ref 4.22–5.81)
RDW: 16.4 % — ABNORMAL HIGH (ref 11.5–15.5)
WBC: 7.9 10*3/uL (ref 4.0–10.5)

## 2015-03-17 LAB — COMPREHENSIVE METABOLIC PANEL
ALBUMIN: 3.6 g/dL (ref 3.5–5.0)
ALT: 14 U/L — AB (ref 17–63)
AST: 19 U/L (ref 15–41)
Alkaline Phosphatase: 95 U/L (ref 38–126)
Anion gap: 7 (ref 5–15)
BUN: 7 mg/dL (ref 6–20)
CHLORIDE: 102 mmol/L (ref 101–111)
CO2: 27 mmol/L (ref 22–32)
CREATININE: 0.69 mg/dL (ref 0.61–1.24)
Calcium: 8.7 mg/dL — ABNORMAL LOW (ref 8.9–10.3)
GFR calc Af Amer: >60 mL/min (ref >60)
GFR calc non Af Amer: >60 mL/min (ref >60)
Glucose, Bld: 140 mg/dL — ABNORMAL HIGH (ref 65–99)
Potassium: 3.3 mmol/L — ABNORMAL LOW (ref 3.5–5.1)
SODIUM: 136 mmol/L (ref 135–145)
Total Bilirubin: 0.4 mg/dL (ref 0.3–1.2)
Total Protein: 6.9 g/dL (ref 6.5–8.1)

## 2015-03-17 LAB — URINALYSIS, DIPSTICK ONLY
Glucose, UA: NEGATIVE mg/dL
HGB URINE DIPSTICK: NEGATIVE
KETONES UR: NEGATIVE mg/dL
LEUKOCYTES UA: NEGATIVE
Nitrite: NEGATIVE
Protein, ur: 30 mg/dL — AB
Urobilinogen, UA: 0.2 mg/dL (ref 0.0–1.0)
pH: 5.5 (ref 5.0–8.0)

## 2015-03-17 MED ORDER — OXALIPLATIN CHEMO INJECTION 100 MG/20ML
85.0000 mg/m2 | Freq: Once | INTRAVENOUS | Status: AC
  Administered 2015-03-17: 165 mg via INTRAVENOUS
  Filled 2015-03-17: qty 33

## 2015-03-17 MED ORDER — POTASSIUM CHLORIDE CRYS ER 20 MEQ PO TBCR: 20.0000 meq | EXTENDED_RELEASE_TABLET | Freq: Two times a day (BID) | ORAL | Status: DC

## 2015-03-17 MED ORDER — DEXTROSE 5 % IV SOLN
400.0000 mg/m2 | Freq: Once | INTRAVENOUS | Status: AC
  Administered 2015-03-17: 776 mg via INTRAVENOUS
  Filled 2015-03-17: qty 38.8

## 2015-03-17 MED ORDER — SODIUM CHLORIDE 0.9 % IJ SOLN
10.0000 mL | INTRAMUSCULAR | Status: DC | PRN
  Administered 2015-03-17: 10 mL
  Filled 2015-03-17: qty 10

## 2015-03-17 MED ORDER — SODIUM CHLORIDE 0.9 % IV SOLN
Freq: Once | INTRAVENOUS | Status: AC
  Administered 2015-03-17: 10:00:00 via INTRAVENOUS

## 2015-03-17 MED ORDER — SODIUM CHLORIDE 0.9 % IV SOLN
5.0000 mg/kg | Freq: Once | INTRAVENOUS | Status: AC
  Administered 2015-03-17: 400 mg via INTRAVENOUS
  Filled 2015-03-17: qty 16

## 2015-03-17 MED ORDER — SODIUM CHLORIDE 0.9 % IV SOLN
Freq: Once | INTRAVENOUS | Status: AC
  Administered 2015-03-17: 10:00:00 via INTRAVENOUS
  Filled 2015-03-17: qty 4

## 2015-03-17 MED ORDER — DEXTROSE 5 % IV SOLN
Freq: Once | INTRAVENOUS | Status: AC
  Administered 2015-03-17: 11:00:00 via INTRAVENOUS

## 2015-03-17 MED ORDER — SODIUM CHLORIDE 0.9 % IV SOLN
2400.0000 mg/m2 | INTRAVENOUS | Status: DC
  Administered 2015-03-17: 4650 mg via INTRAVENOUS
  Filled 2015-03-17: qty 93

## 2015-03-17 NOTE — Patient Instructions (Signed)
[facility] Discharge Instructions for Patients Receiving Chemotherapy  Today you received the following chemotherapy agents:  Oxaliplatin, leucovorin, avastin.   If you develop nausea and vomiting, or diarrhea that is not controlled by your medication, call the clinic.  The clinic phone number is [phone]. Office hours are Monday-Friday 8:30am-5:00pm.  BELOW ARE SYMPTOMS THAT SHOULD BE REPORTED IMMEDIATELY:  *FEVER GREATER THAN 101.0 F  *CHILLS WITH OR WITHOUT FEVER  NAUSEA AND VOMITING THAT IS NOT CONTROLLED WITH YOUR NAUSEA MEDICATION  *UNUSUAL SHORTNESS OF BREATH  *UNUSUAL BRUISING OR BLEEDING  TENDERNESS IN MOUTH AND THROAT WITH OR WITHOUT PRESENCE OF ULCERS  *URINARY PROBLEMS  *BOWEL PROBLEMS  UNUSUAL RASH Items with * indicate a potential emergency and should be followed up as soon as possible. If you have an emergency after office hours please contact your primary care physician or go to the nearest emergency department.  Please call the clinic during office hours if you have any questions or concerns.   You may also contact the Patient Navigator at [phone] should you have any questions or need assistance in obtaining follow up care.

## 2015-03-17 NOTE — Progress Notes (Signed)
Briefly followed up with pt while dropping off Ensure  Wt Readings from Last 10 Encounters:  03/12/15 177 lb 4.8 oz (80.423 kg)  03/03/15 178 lb (80.74 kg)  02/19/15 175 lb (79.379 kg)  02/11/15 182 lb (82.555 kg)  02/05/15 182 lb (82.555 kg)  01/28/15 184 lb (83.462 kg)  12/31/14 195 lb 6.4 oz (88.633 kg)  12/17/14 196 lb 3.2 oz (88.996 kg)  12/10/14 194 lb (87.998 kg)  12/03/14 201 lb (91.173 kg)   Patient weight has been stable for about 1 month  Patient reports his oral intake is good. He does report suffering from side effects of treatment: nausea, taste loss, fatigue, extreme sensitivity to cold. Typically, these are the worst on days 3-4 after treatments.    He drinks varying amount of ensures each day depending on how he is feeling. He states they are ideal for the day of and days following chemo. He will typically stay in bed most of the day and he is able to rely on them then.   While in room, noted that he ate all his bag lunch as well as some supplements.   Pt appeared and sounded to be fairly well nourished. Per PA, patient currently doing well. Will continue to monitor.   Burtis Junes RD, LDN Nutrition Pager: [phone] 03/17/2015 12:06 PM

## 2015-03-17 NOTE — Progress Notes (Signed)
1350:  Tolerated treatment w/o adverse reaction. VSS.  A&Ox4; in no distress.

## 2015-03-19 ENCOUNTER — Encounter (HOSPITAL_BASED_OUTPATIENT_CLINIC_OR_DEPARTMENT_OTHER): Payer: Medicaid Other

## 2015-03-19 ENCOUNTER — Other Ambulatory Visit (HOSPITAL_COMMUNITY): Payer: Self-pay | Admitting: Oncology

## 2015-03-19 DIAGNOSIS — C7801 Secondary malignant neoplasm of right lung: Secondary | ICD-10-CM

## 2015-03-19 DIAGNOSIS — C20 Malignant neoplasm of rectum: Secondary | ICD-10-CM

## 2015-03-19 DIAGNOSIS — C7800 Secondary malignant neoplasm of unspecified lung: Principal | ICD-10-CM

## 2015-03-19 DIAGNOSIS — Z452 Encounter for adjustment and management of vascular access device: Secondary | ICD-10-CM

## 2015-03-19 MED ORDER — HEPARIN SOD (PORK) LOCK FLUSH 100 UNIT/ML IV SOLN: 500.0000 [IU] | Freq: Once | INTRAVENOUS | Status: DC | PRN

## 2015-03-19 MED ORDER — SODIUM CHLORIDE 0.9 % IJ SOLN: 10.0000 mL | INTRAMUSCULAR | Status: DC | PRN

## 2015-03-19 NOTE — Patient Instructions (Signed)
[place] at [facility] Discharge Instructions  RECOMMENDATIONS MADE BY THE CONSULTANT AND ANY TEST RESULTS WILL BE SENT TO YOUR REFERRING PHYSICIAN.  Home infusion pump discharged.  Please return as scheduled.    Thank you for choosing [place] at [facility] to provide your oncology and hematology care.  To afford each patient quality time with our provider, please arrive at least 15 minutes before your scheduled appointment time.    You need to re-schedule your appointment should you arrive 10 or more minutes late.  We strive to give you quality time with our providers, and arriving late affects you and other patients whose appointments are after yours.  Also, if you no show three or more times for appointments you may be dismissed from the clinic at the providers discretion.     Again, thank you for choosing [facility].  Our hope is that these requests will decrease the amount of time that you wait before being seen by our physicians.       _____________________________________________________________  Should you have questions after your visit to [facility], please contact our office at [phone] between the hours of 8:30 a.m. and 4:30 p.m.  Voicemails left after 4:30 p.m. will not be returned until the following business day.  For prescription refill requests, have your pharmacy contact our office.

## 2015-03-19 NOTE — Progress Notes (Signed)
Message sent to Dr. Terita Hejl Muse and Robynn Pane, PA that there are no more treatment days planned as of today.   Port flushed and de-accessed without difficulty.  No concerns or complaints.  VSS.  Blood return noted.

## 2015-03-22 ENCOUNTER — Emergency Department (HOSPITAL_COMMUNITY): Payer: Medicaid Other

## 2015-03-22 ENCOUNTER — Other Ambulatory Visit (HOSPITAL_COMMUNITY): Payer: Self-pay | Admitting: Hematology & Oncology

## 2015-03-22 ENCOUNTER — Encounter (HOSPITAL_COMMUNITY): Payer: Self-pay | Admitting: *Deleted

## 2015-03-22 ENCOUNTER — Emergency Department (HOSPITAL_COMMUNITY)
Admission: EM | Admit: 2015-03-22 | Discharge: 2015-03-22 | Disposition: A | Discharge status: Home / Self Care | Payer: Medicaid Other | Attending: Emergency Medicine | Admitting: Emergency Medicine

## 2015-03-22 DIAGNOSIS — M1990 Unspecified osteoarthritis, unspecified site: Secondary | ICD-10-CM | POA: Diagnosis not present

## 2015-03-22 DIAGNOSIS — F329 Major depressive disorder, single episode, unspecified: Secondary | ICD-10-CM | POA: Insufficient documentation

## 2015-03-22 DIAGNOSIS — R109 Unspecified abdominal pain: Secondary | ICD-10-CM | POA: Diagnosis present

## 2015-03-22 DIAGNOSIS — Z85048 Personal history of other malignant neoplasm of rectum, rectosigmoid junction, and anus: Secondary | ICD-10-CM | POA: Insufficient documentation

## 2015-03-22 DIAGNOSIS — F17210 Nicotine dependence, cigarettes, uncomplicated: Secondary | ICD-10-CM | POA: Diagnosis not present

## 2015-03-22 DIAGNOSIS — Z85528 Personal history of other malignant neoplasm of kidney: Secondary | ICD-10-CM | POA: Diagnosis not present

## 2015-03-22 DIAGNOSIS — N200 Calculus of kidney: Secondary | ICD-10-CM | POA: Diagnosis not present

## 2015-03-22 DIAGNOSIS — R000 Tachycardia, unspecified: Secondary | ICD-10-CM | POA: Diagnosis not present

## 2015-03-22 DIAGNOSIS — R110 Nausea: Secondary | ICD-10-CM | POA: Insufficient documentation

## 2015-03-22 DIAGNOSIS — F419 Anxiety disorder, unspecified: Secondary | ICD-10-CM | POA: Diagnosis not present

## 2015-03-22 DIAGNOSIS — Z79899 Other long term (current) drug therapy: Secondary | ICD-10-CM | POA: Insufficient documentation

## 2015-03-22 HISTORY — DX: Calculus of kidney: N20.0

## 2015-03-22 LAB — COMPREHENSIVE METABOLIC PANEL
ALK PHOS: 111 U/L (ref 38–126)
ALT: 19 U/L (ref 17–63)
ANION GAP: 6 (ref 5–15)
AST: 21 U/L (ref 15–41)
Albumin: 4 g/dL (ref 3.5–5.0)
BILIRUBIN TOTAL: 0.2 mg/dL — AB (ref 0.3–1.2)
BUN: 5 mg/dL — ABNORMAL LOW (ref 6–20)
CALCIUM: 8.9 mg/dL (ref 8.9–10.3)
CO2: 27 mmol/L (ref 22–32)
CREATININE: 0.87 mg/dL (ref 0.61–1.24)
Chloride: 101 mmol/L (ref 101–111)
Glucose, Bld: 153 mg/dL — ABNORMAL HIGH (ref 65–99)
Potassium: 3.6 mmol/L (ref 3.5–5.1)
SODIUM: 134 mmol/L — AB (ref 135–145)
TOTAL PROTEIN: 7.6 g/dL (ref 6.5–8.1)

## 2015-03-22 LAB — CBC WITH DIFFERENTIAL/PLATELET
Basophils Absolute: 0 10*3/uL (ref 0.0–0.1)
Basophils Relative: 0 %
EOS ABS: 0.1 10*3/uL (ref 0.0–0.7)
Eosinophils Relative: 1 %
HEMATOCRIT: 44.2 % (ref 39.0–52.0)
HEMOGLOBIN: 14.7 g/dL (ref 13.0–17.0)
LYMPHS ABS: 0.4 10*3/uL — AB (ref 0.7–4.0)
LYMPHS PCT: 5 %
MCH: 32.5 pg (ref 26.0–34.0)
MCHC: 33.3 g/dL (ref 30.0–36.0)
MCV: 97.8 fL (ref 78.0–100.0)
MONOS PCT: 7 %
Monocytes Absolute: 0.6 10*3/uL (ref 0.1–1.0)
NEUTROS ABS: 7.1 10*3/uL (ref 1.7–7.7)
NEUTROS PCT: 87 %
Platelets: 273 10*3/uL (ref 150–400)
RBC: 4.52 MIL/uL (ref 4.22–5.81)
RDW: 15.1 % (ref 11.5–15.5)
WBC: 8.3 10*3/uL (ref 4.0–10.5)

## 2015-03-22 LAB — URINALYSIS, ROUTINE W REFLEX MICROSCOPIC
BILIRUBIN URINE: NEGATIVE
Glucose, UA: NEGATIVE mg/dL
Ketones, ur: NEGATIVE mg/dL
Leukocytes, UA: NEGATIVE
NITRITE: NEGATIVE
PH: 5.5 (ref 5.0–8.0)
Protein, ur: NEGATIVE mg/dL
SPECIFIC GRAVITY, URINE: 1.025 (ref 1.005–1.030)

## 2015-03-22 LAB — LIPASE, BLOOD: Lipase: 52 U/L — ABNORMAL HIGH (ref 11–51)

## 2015-03-22 LAB — URINE MICROSCOPIC-ADD ON

## 2015-03-22 MED ORDER — ONDANSETRON HCL 4 MG/2ML IJ SOLN
INTRAMUSCULAR | Status: AC
  Administered 2015-03-22: 4 mg via INTRAVENOUS
  Filled 2015-03-22: qty 2

## 2015-03-22 MED ORDER — ONDANSETRON 4 MG PO TBDP: ORAL_TABLET | ORAL | Status: DC

## 2015-03-22 MED ORDER — ONDANSETRON HCL 4 MG/2ML IJ SOLN
4.0000 mg | Freq: Once | INTRAMUSCULAR | Status: AC
  Administered 2015-03-22: 4 mg via INTRAVENOUS

## 2015-03-22 MED ORDER — HYDROMORPHONE HCL 1 MG/ML IJ SOLN
1.0000 mg | Freq: Once | INTRAMUSCULAR | Status: AC
  Administered 2015-03-22: 1 mg via INTRAVENOUS
  Filled 2015-03-22: qty 1

## 2015-03-22 MED ORDER — OXYCODONE-ACETAMINOPHEN 5-325 MG PO TABS: 2.0000 | ORAL_TABLET | ORAL | Status: DC | PRN

## 2015-03-22 MED ORDER — ONDANSETRON 4 MG PREPACK (~~LOC~~): 1.0000 | ORAL_TABLET | Freq: Three times a day (TID) | ORAL | Status: DC | PRN

## 2015-03-22 MED ORDER — SODIUM CHLORIDE 0.9 % IV BOLUS (SEPSIS)
1000.0000 mL | Freq: Once | INTRAVENOUS | Status: AC
  Administered 2015-03-22: 1000 mL via INTRAVENOUS

## 2015-03-22 MED ORDER — TAMSULOSIN HCL 0.4 MG PO CAPS
0.4000 mg | ORAL_CAPSULE | Freq: Once | ORAL | Status: AC
  Administered 2015-03-22: 0.4 mg via ORAL
  Filled 2015-03-22: qty 1

## 2015-03-22 MED ORDER — TAMSULOSIN HCL 0.4 MG PO CAPS: 0.4000 mg | ORAL_CAPSULE | Freq: Every day | ORAL | Status: DC

## 2015-03-22 MED ORDER — KETOROLAC TROMETHAMINE 30 MG/ML IJ SOLN
30.0000 mg | Freq: Once | INTRAMUSCULAR | Status: AC
  Administered 2015-03-22: 30 mg via INTRAVENOUS
  Filled 2015-03-22: qty 1

## 2015-03-22 NOTE — ED Notes (Signed)
Sudden left flank pain began this morning. Pt states hx of right partial nephrectomy. Pt is tearful in triage.

## 2015-03-22 NOTE — ED Notes (Signed)
Pt reports recently dx with UTI and is currently being treated with antibiotics since last Tuesday.

## 2015-03-22 NOTE — ED Provider Notes (Signed)
CSN: [id number]     Arrival date & time 03/22/15  1436 History   First MD Initiated Contact with Patient 03/22/15 1444     Chief Complaint  Patient presents with  . Flank Pain     (Consider location/radiation/quality/duration/timing/severity/associated sxs/prior Treatment) Patient is a 49 y.o. male presenting with flank pain.  Flank Pain The current episode started 3 to 5 hours ago. The problem occurs constantly. Associated symptoms include abdominal pain. Pertinent negatives include no chest pain, no headaches and no shortness of breath. Nothing aggravates the symptoms. Nothing relieves the symptoms. He has tried nothing for the symptoms.    Past Medical History  Diagnosis Date  . Rectal pain   . Arthritis   . Renal cell cancer ([place])     2012.   [name] Rectal cancer ([place])   . Shortness of breath dyspnea   . Anxiety   . Depression    Past Surgical History  Procedure Laterality Date  . Hypospadias correction    . Laparoscopic partial nephrectomy  2012    right side  . Knee surgery Left     arthroscopy  . Flexible sigmoidoscopy N/A 09/30/2014    Procedure: FLEXIBLE SIGMOIDOSCOPY;  Surgeon: Danie Binder, MD;  Location: [facility];  Service: Endoscopy;  Laterality: N/A;  . Esophageal biopsy N/A 09/30/2014    Procedure: BIOPSY;  Surgeon: Danie Binder, MD;  Location: [facility];  Service: Endoscopy;  Laterality: N/A;  Anal Canal  . Flexible sigmoidoscopy N/A 10/17/2014    Procedure: FLEXIBLE SIGMOIDOSCOPY;  Surgeon: Danie Binder, MD;  Location: [facility];  Service: Endoscopy;  Laterality: N/A;  1325  . Lung biopsy Right   . Colostomy N/A 02/11/2015    Procedure: COLOSTOMY;  Surgeon: Aviva Signs, MD;  Location: [facility];  Service: General;  Laterality: N/A;   Family History  Problem Relation Age of Onset  . Lung cancer Maternal Grandfather   . Colon cancer Maternal Uncle     age 43  . Diabetes Other   . Healthy Mother    Social History  Substance Use Topics  . Smoking status:  Current Every Day Smoker -- 0.50 packs/day for 30 years    Types: Cigarettes  . Smokeless tobacco: None     Comment: a little over a pack daily  . Alcohol Use: 0.0 oz/week    0 Standard drinks or equivalent per week     Comment: Occ    Review of Systems  Constitutional: Negative for fever and chills.  Eyes: Negative for photophobia and pain.  Respiratory: Negative for shortness of breath.   Cardiovascular: Negative for chest pain.  Gastrointestinal: Positive for nausea and abdominal pain. Negative for vomiting, constipation and blood in stool.  Endocrine: Negative for polydipsia and polyuria.  Genitourinary: Positive for flank pain.  Musculoskeletal: Positive for back pain (left flank). Negative for myalgias.  Neurological: Negative for headaches.  All other systems reviewed and are negative.     Allergies  Review of patient's allergies indicates no known allergies.  Home Medications   Prior to Admission medications   Medication Sig Start Date End Date Taking? Authorizing Provider  amitriptyline (ELAVIL) 50 MG tablet Take 1 tablet (50 mg total) by mouth at bedtime. 12/03/14  Yes Patrici Ranks, MD  ciprofloxacin (CIPRO) 250 MG tablet Take 1 tablet twice daily for 7 days. Patient taking differently: Take 250 mg by mouth 2 (two) times daily.  03/12/15  Yes Patrici Ranks, MD  citalopram (CELEXA) 40  MG tablet TAKE 1/2 TABLET BY MOUTH FOR 5 DAYS  THEN ONE TABLET DAILY THEREAFTER. Patient taking differently: Take 40 mg by mouth daily.  01/14/15  Yes Patrici Ranks, MD  docusate sodium (COLACE) 100 MG capsule Take 1 capsule (100 mg total) by mouth every 12 (twelve) hours. Patient taking differently: Take 100 mg by mouth at bedtime.  09/25/14  Yes Christopher Lawyer, PA-C  fentaNYL (DURAGESIC - DOSED MCG/HR) 75 MCG/HR Combine 75 mcg patch with 50 mcg patch for total of 133mcg every 3 days. Apply to skin and change every 3 days 03/12/15  Yes Patrici Ranks, MD  HYDROmorphone  (DILAUDID) 4 MG tablet Take 1 tablet (4 mg total) by mouth every 4 (four) hours as needed for severe pain. 03/09/15  Yes Manon Hilding Kefalas, PA-C  lidocaine-hydrocortisone (ANAMANTLE) 3-1 % KIT Place 1 application rectally 2 (two) times daily. For two weeks, alternate with Rectiv. Patient taking differently: Place 1 application rectally daily as needed. For two weeks, alternate with Rectiv. 09/26/14  Yes Mahala Menghini, PA-C  nitroGLYCERIN (NITROGLYN) 2 % ointment Apply 0.5 inches topically daily. Anal area   Yes Historical Provider, MD  potassium chloride SA (K-DUR,KLOR-CON) 20 MEQ tablet Take 1 tablet (20 mEq total) by mouth 2 (two) times daily. 03/17/15  Yes Manon Hilding Kefalas, PA-C  pregabalin (LYRICA) 75 MG capsule Take 1 capsule (75 mg total) by mouth 2 (two) times daily. 03/12/15  Yes Patrici Ranks, MD  prochlorperazine (COMPAZINE) 10 MG tablet Take 1 tablet (10 mg total) by mouth every 6 (six) hours as needed for nausea or vomiting. 11/02/14  Yes Patrici Ranks, MD  Bevacizumab (AVASTIN IV) Inject into the vein every 14 (fourteen) days. To start November 19, 2014    Historical Provider, MD  dextrose 5 % SOLN 1,000 mL with fluorouracil 5 GM/100ML SOLN Inject into the vein every 14 (fourteen) days. To start November 05, 2014. To infuse over 46 hours via pump.    Historical Provider, MD  DULoxetine (CYMBALTA) 30 MG capsule Take one capsule daily for 5 days then 2 daily thereafter Patient taking differently: Take 30 mg by mouth daily.  02/19/15   Patrici Ranks, MD  fentaNYL (DURAGESIC - DOSED MCG/HR) 50 MCG/HR Place 1 patch (50 mcg total) onto the skin every 3 (three) days. Patient taking differently: Place 150 mcg onto the skin every 3 (three) days.  02/19/15   Patrici Ranks, MD  leucovorin 50 MG injection Inject into the vein every 14 (fourteen) days. To start November 05, 2014    Historical Provider, MD  lidocaine-prilocaine (EMLA) cream Apply a quarter size amount to port site 1 hour prior to chemo.  Do not rub in. Cover with plastic wrap. 11/02/14   Patrici Ranks, MD  ondansetron (ZOFRAN ODT) 4 MG disintegrating tablet 4mg  ODT q4 hours prn nausea/vomit 03/22/15   Merrily Pew, MD  ondansetron (ZOFRAN) 4 mg TABS tablet Take 4 tablets by mouth every 8 (eight) hours as needed. 03/22/15   Merrily Pew, MD  OXALIPLATIN IV Inject into the vein every 14 (fourteen) days. To start November 05, 2014    Historical Provider, MD  oxyCODONE-acetaminophen (PERCOCET/ROXICET) 5-325 MG tablet Take 2 tablets by mouth every 4 (four) hours as needed for severe pain. 03/22/15   Merrily Pew, MD  oxyCODONE-acetaminophen (PERCOCET/ROXICET) 5-325 MG tablet Take 2 tablets by mouth every 4 (four) hours as needed for severe pain. 03/22/15   Merrily Pew, MD  tamsulosin (FLOMAX) 0.4 MG  CAPS capsule Take 1 capsule (0.4 mg total) by mouth daily. 03/22/15   Merrily Pew, MD   BP 117/72 mmHg  Pulse 77  Temp(Src) 98.1 F (36.7 C) (Oral)  Resp 16  Ht $R'5\' 6"'jk$  (1.676 m)  Wt 177 lb (80.287 kg)  BMI 28.58 kg/m2  SpO2 100% Physical Exam  Constitutional: He is oriented to person, place, and time. He appears well-developed and well-nourished. He appears distressed (likely 2/2 pain).  HENT:  Head: Normocephalic and atraumatic.  Eyes: Conjunctivae are normal. Pupils are equal, round, and reactive to light.  Neck: Normal range of motion.  Cardiovascular: Tachycardia present.   Pulmonary/Chest: Effort normal and breath sounds normal. No respiratory distress.  Abdominal: Soft. Bowel sounds are normal. He exhibits no distension.  Musculoskeletal: Normal range of motion. He exhibits tenderness (left flank). He exhibits no edema.  Neurological: He is alert and oriented to person, place, and time. No cranial nerve deficit.  Skin: Skin is warm and dry. No rash noted.  Psychiatric: He has a normal mood and affect. His behavior is normal.  Nursing note and vitals reviewed.   ED Course  Procedures (including critical care time) Labs  Review Labs Reviewed  URINALYSIS, ROUTINE W REFLEX MICROSCOPIC (NOT AT [facility]) - Abnormal; Notable for the following:    Hgb urine dipstick TRACE (*)    All other components within normal limits  CBC WITH DIFFERENTIAL/PLATELET - Abnormal; Notable for the following:    Lymphs Abs 0.4 (*)    All other components within normal limits  COMPREHENSIVE METABOLIC PANEL - Abnormal; Notable for the following:    Sodium 134 (*)    Glucose, Bld 153 (*)    BUN 5 (*)    Total Bilirubin 0.2 (*)    All other components within normal limits  LIPASE, BLOOD - Abnormal; Notable for the following:    Lipase 52 (*)    All other components within normal limits  URINE MICROSCOPIC-ADD ON - Abnormal; Notable for the following:    Squamous Epithelial / LPF 0-5 (*)    Bacteria, UA FEW (*)    All other components within normal limits    Imaging Review Ct Renal Stone Study  03/22/2015  CLINICAL DATA:  Patient with sudden onset left flank pain. History of renal cell cancer and rectal cancer. EXAM: CT ABDOMEN AND PELVIS WITHOUT CONTRAST TECHNIQUE: Multidetector CT imaging of the abdomen and pelvis was performed following the standard protocol without IV contrast. COMPARISON:  CT abdomen pelvis 02/26/2015. FINDINGS: Lower chest: Re- demonstrated 9 mm left upper lobe nodule (image 13; series 6). Re- demonstrated 8 mm right lower lobe nodule (image 12; series 6). No pleural effusion. Normal heart size. Hepatobiliary: The liver is normal in size and contour. Gallbladder is unremarkable. No intrahepatic or extrahepatic biliary ductal dilatation. Pancreas: Multiple parenchymal calcifications within the pancreas, unchanged. Spleen: Unremarkable Adrenals/Urinary Tract: Normal adrenal glands. There is moderate left hydroureteronephrosis to the level of the distal left ureter were there is an obstructing 4 mm stone. Additionally there is a small calcification within the posterior right aspect of the urinary bladder (image 78; series  2) which may represent bladder wall calcification or recently passed stone. No right-sided hydronephrosis. Postsurgical changes involving the inferior aspect of the right kidney. Stomach/Bowel: Stable appearance of mild thickening of the rectum. Left lower quadrant colostomy. No evidence for bowel obstruction. No free fluid or free intraperitoneal air. The appendix is normal. Vascular/Lymphatic: Normal caliber abdominal aorta. Peripheral calcified atherosclerotic plaque. No retroperitoneal lymphadenopathy.  Other: Central dystrophic calcifications in the prostate. Musculoskeletal: No aggressive or acute appearing osseous lesions. IMPRESSION: There is an obstructing 4 mm stone within the distal left ureter resulting in moderate left hydroureteronephrosis. Small calcific density within the posterior right aspect of the urinary bladder which may represent bladder wall calcification or recently passed stone. Re- demonstrated pulmonary nodules within the right lower lobe and lingula, compatible with known metastasis. Electronically Signed   By: Lovey Newcomer M.D.   On: 03/22/2015 15:59   I have personally reviewed and evaluated these images and lab results as part of my medical decision-making.   EKG Interpretation None      MDM   Final diagnoses:  Flank pain  Kidney stone   Stone v pyelo, less likely pyelo 2/2 no fever. Also could be metastatic disease as well.   CT with evidence of 4 mm obstructing stone at the ureterovesical junction.November of his creatinine. No evidence of continued infection however still on ciprofloxacin. Pain is controlled, tolerating by mouth fluids or medications. We'll DC with Flomax, pain medicine, Zofran and follow-up with urology. Return here for any worsening nausea vomiting and intractable pain or inability take medications.      Merrily Pew, MD 03/22/15 1950

## 2015-03-22 NOTE — ED Notes (Signed)
Pt requesting more pain medication. Dr Dayna Barker notified. No new orders at this time.

## 2015-03-23 ENCOUNTER — Telehealth (HOSPITAL_COMMUNITY): Payer: Self-pay | Admitting: *Deleted

## 2015-03-24 ENCOUNTER — Other Ambulatory Visit (HOSPITAL_COMMUNITY): Payer: Self-pay | Admitting: Oncology

## 2015-03-24 DIAGNOSIS — C7800 Secondary malignant neoplasm of unspecified lung: Secondary | ICD-10-CM

## 2015-03-24 DIAGNOSIS — C20 Malignant neoplasm of rectum: Secondary | ICD-10-CM

## 2015-03-24 MED ORDER — HYDROMORPHONE HCL 4 MG PO TABS: 4.0000 mg | ORAL_TABLET | ORAL | Status: DC | PRN

## 2015-03-24 NOTE — Telephone Encounter (Signed)
Patient notified Rx is ready for pick-up.

## 2015-03-24 NOTE — Telephone Encounter (Signed)
Done  Robynn Pane, PA-C

## 2015-03-31 ENCOUNTER — Encounter (HOSPITAL_BASED_OUTPATIENT_CLINIC_OR_DEPARTMENT_OTHER): Payer: Medicaid Other

## 2015-03-31 ENCOUNTER — Encounter (HOSPITAL_COMMUNITY): Payer: Self-pay | Admitting: Oncology

## 2015-03-31 ENCOUNTER — Encounter (HOSPITAL_BASED_OUTPATIENT_CLINIC_OR_DEPARTMENT_OTHER): Payer: Medicaid Other | Admitting: Oncology

## 2015-03-31 ENCOUNTER — Inpatient Hospital Stay (HOSPITAL_COMMUNITY): Payer: Medicaid Other

## 2015-03-31 ENCOUNTER — Encounter (HOSPITAL_COMMUNITY): Payer: Self-pay | Admitting: Lab

## 2015-03-31 VITALS — BP 127/82 | HR 89 | Temp 98.6°F | Resp 16 | Wt 177.4 lb

## 2015-03-31 VITALS — BP 156/85 | HR 88 | Temp 98.0°F | Resp 18

## 2015-03-31 DIAGNOSIS — C7801 Secondary malignant neoplasm of right lung: Secondary | ICD-10-CM

## 2015-03-31 DIAGNOSIS — Z5112 Encounter for antineoplastic immunotherapy: Secondary | ICD-10-CM | POA: Diagnosis not present

## 2015-03-31 DIAGNOSIS — C20 Malignant neoplasm of rectum: Secondary | ICD-10-CM

## 2015-03-31 DIAGNOSIS — C7800 Secondary malignant neoplasm of unspecified lung: Secondary | ICD-10-CM

## 2015-03-31 DIAGNOSIS — R918 Other nonspecific abnormal finding of lung field: Secondary | ICD-10-CM | POA: Diagnosis not present

## 2015-03-31 DIAGNOSIS — Z5111 Encounter for antineoplastic chemotherapy: Secondary | ICD-10-CM | POA: Diagnosis not present

## 2015-03-31 LAB — COMPREHENSIVE METABOLIC PANEL
ALK PHOS: 87 U/L (ref 38–126)
ALT: 15 U/L — AB (ref 17–63)
AST: 20 U/L (ref 15–41)
Albumin: 3.4 g/dL — ABNORMAL LOW (ref 3.5–5.0)
Anion gap: 10 (ref 5–15)
CALCIUM: 8.8 mg/dL — AB (ref 8.9–10.3)
CHLORIDE: 99 mmol/L — AB (ref 101–111)
CO2: 27 mmol/L (ref 22–32)
CREATININE: 0.79 mg/dL (ref 0.61–1.24)
GFR calc Af Amer: >60 mL/min (ref >60)
Glucose, Bld: 108 mg/dL — ABNORMAL HIGH (ref 65–99)
Potassium: 3.6 mmol/L (ref 3.5–5.1)
Sodium: 136 mmol/L (ref 135–145)
Total Bilirubin: 0.3 mg/dL (ref 0.3–1.2)
Total Protein: 7.2 g/dL (ref 6.5–8.1)

## 2015-03-31 LAB — CBC WITH DIFFERENTIAL/PLATELET
BASOS ABS: 0 10*3/uL (ref 0.0–0.1)
Basophils Relative: 0 %
EOS PCT: 3 %
Eosinophils Absolute: 0.2 10*3/uL (ref 0.0–0.7)
HCT: 39.2 % (ref 39.0–52.0)
HEMOGLOBIN: 12.9 g/dL — AB (ref 13.0–17.0)
LYMPHS ABS: 0.8 10*3/uL (ref 0.7–4.0)
LYMPHS PCT: 11 %
MCH: 32.6 pg (ref 26.0–34.0)
MCHC: 32.9 g/dL (ref 30.0–36.0)
MCV: 99 fL (ref 78.0–100.0)
Monocytes Absolute: 0.6 10*3/uL (ref 0.1–1.0)
Monocytes Relative: 9 %
NEUTROS ABS: 5.1 10*3/uL (ref 1.7–7.7)
NEUTROS PCT: 77 %
PLATELETS: 261 10*3/uL (ref 150–400)
RBC: 3.96 MIL/uL — AB (ref 4.22–5.81)
RDW: 15.4 % (ref 11.5–15.5)
WBC: 6.7 10*3/uL (ref 4.0–10.5)

## 2015-03-31 LAB — URINALYSIS, DIPSTICK ONLY
BILIRUBIN URINE: NEGATIVE
Glucose, UA: NEGATIVE mg/dL
HGB URINE DIPSTICK: NEGATIVE
KETONES UR: NEGATIVE mg/dL
Nitrite: NEGATIVE
PH: 6 (ref 5.0–8.0)
Protein, ur: NEGATIVE mg/dL

## 2015-03-31 MED ORDER — SODIUM CHLORIDE 0.9 % IV SOLN
5.0000 mg/kg | Freq: Once | INTRAVENOUS | Status: AC
  Administered 2015-03-31: 400 mg via INTRAVENOUS
  Filled 2015-03-31: qty 16

## 2015-03-31 MED ORDER — SODIUM CHLORIDE 0.9 % IV SOLN
2400.0000 mg/m2 | INTRAVENOUS | Status: DC
  Administered 2015-03-31: 4650 mg via INTRAVENOUS
  Filled 2015-03-31: qty 93

## 2015-03-31 MED ORDER — LEUCOVORIN CALCIUM INJECTION 350 MG
400.0000 mg/m2 | Freq: Once | INTRAVENOUS | Status: AC
  Administered 2015-03-31: 776 mg via INTRAVENOUS
  Filled 2015-03-31: qty 38.8

## 2015-03-31 MED ORDER — DEXTROSE 5 % IV SOLN
Freq: Once | INTRAVENOUS | Status: AC
  Administered 2015-03-31: 11:00:00 via INTRAVENOUS

## 2015-03-31 MED ORDER — OXALIPLATIN CHEMO INJECTION 100 MG/20ML
85.0000 mg/m2 | Freq: Once | INTRAVENOUS | Status: AC
  Administered 2015-03-31: 165 mg via INTRAVENOUS
  Filled 2015-03-31: qty 33

## 2015-03-31 MED ORDER — SODIUM CHLORIDE 0.9 % IV SOLN
Freq: Once | INTRAVENOUS | Status: AC
  Administered 2015-03-31: 10:00:00 via INTRAVENOUS
  Filled 2015-03-31: qty 4

## 2015-03-31 MED ORDER — SODIUM CHLORIDE 0.9 % IV SOLN
Freq: Once | INTRAVENOUS | Status: AC
  Administered 2015-03-31: 10:00:00 via INTRAVENOUS

## 2015-03-31 NOTE — Progress Notes (Signed)
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 male returns for followup of Stage IV rectal cancer.    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    09/25/2014 Imaging CT abd/pelvis- Indeterminate nodules within the bilateral lung bases with dominant approximately 1.7 cm centrally necrotic nodule within the right lower lobe...   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   10/03/2014 Tumor Marker CEA NOT ELEVATED   10/09/2014 Imaging CT Chest- Bilateral pulmonary nodules of varying size are most consistent with pulmonary metastasis. There are approximately 6 lesions in each lung.   10/17/2014 Pathology Results Rectum, biopsy - INVASIVE ADENOCARCINOMA   10/29/2014 Pathology Results Diagnosis Lung, needle/core biopsy(ies), right lower lobe - METASTATIC ADENOCARCINOMA   11/04/2014 - 12/18/2014 Radiation Therapy Dr. Lisbeth Renshaw   11/05/2014 -  Chemotherapy FOLFOX + Avastin   02/11/2015 Surgery Colostomy formation by Dr. Arnoldo Morale.   02/26/2015 Imaging CT CAP- Interval response to therapy with decrease in size of multiple bilateral pulmonary nodules. No new or progressive pulmonary metastasis.  No evidence for metastatic disease within the abdomen or pelvis.    03/22/2015 Imaging CT RENAL STONE- There is an obstructing 4 mm stone within the distal left ureter resulting in moderate left hydroureteronephrosis.  Re- demonstrated pulmonary nodules within the right lower lobe and lingula, compatible with known metastasis.    I personally reviewed and went over laboratory results with the patient.  The results are noted within this dictation.  Chart  reviewed.  ED visit noted from 11/20 for flank pain.  CT abd demonstrated a left distal ureter renal calculus that was obstructing with moderate hydroureteronephrosis.  According to ED note, he was referred to urology, but not admitted to the hospital.  He reports he was given 3 injections of Dilaudid that did not help with the pain.  He reports that he has been taking his Dilaudid and he was prescribed Percocet by the ED.  He is using both for pain control.  He reports that Dilaudid is not helping with his left flank pain, but the Percocet is.  I have asked him to forego the Percocet and add Tylenol to his Fentanyl, Dilaudid pain regimen as this may be more effective for his flank pain.  He is informed not to use more than 3000 mg of Tylenol in a 24 hour period in light of his hepatic mets.  He continues with pain.  Given his renal stone, he reports that he is using more dilaudid for pain control.  He does not need a refill now, but reports he will need one soon.  He will call when he needs a refill.  "Trust me, I hate to use drugs and I am not popping pain pils for the hell of it."    Past Medical History  Diagnosis Date  . Rectal pain   . Arthritis   . Renal cell cancer ([place])     2012.   [name] Rectal cancer ([place])   . Shortness of breath dyspnea   . Anxiety   . Depression   . 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     has No Known Allergies.  Current Outpatient Prescriptions on File Prior to Visit  Medication Sig Dispense Refill  . amitriptyline (ELAVIL) 50 MG tablet Take 1 tablet (50 mg total) by mouth at bedtime. 30 tablet 2  . Bevacizumab (AVASTIN IV) Inject into the vein every 14 (fourteen) days. To start November 19, 2014    . citalopram (CELEXA) 40 MG tablet TAKE 1/2 TABLET BY MOUTH FOR 5 DAYS  THEN ONE TABLET  DAILY THEREAFTER. (Patient taking differently: Take 40 mg by mouth daily. ) 30 tablet 1  . dextrose 5 % SOLN 1,000 mL with fluorouracil 5 GM/100ML SOLN Inject into the vein every 14 (fourteen) days. To start November 05, 2014. To infuse over 46 hours via pump.    [name] docusate sodium (COLACE) 100 MG capsule Take 1 capsule (100 mg total) by mouth every 12 (twelve) hours. (Patient taking differently: Take 100 mg by mouth at bedtime. ) 60 capsule 0  . DULoxetine (CYMBALTA) 30 MG capsule Take one capsule daily for 5 days then 2 daily thereafter (Patient taking differently: Take 30 mg by mouth daily. ) 60 capsule 3  . fentaNYL (DURAGESIC - DOSED MCG/HR) 50 MCG/HR Place 1 patch (50 mcg total) onto the skin every 3 (three) days. (Patient taking differently: Place 150 mcg onto the skin every 3 (three) days. ) 10 patch 0  . HYDROmorphone (DILAUDID) 4 MG tablet Take 1 tablet (4 mg total) by mouth every 4 (four) hours as needed for severe pain. 60 tablet 0  . leucovorin 50 MG injection Inject into the vein every 14 (fourteen) days. To start November 05, 2014    . lidocaine-prilocaine (EMLA) cream Apply a quarter size amount to port site 1 hour prior to chemo. Do not rub in. Cover with plastic wrap. 30 g 3  . nitroGLYCERIN (NITROGLYN) 2 % ointment Apply 0.5 inches topically daily. Anal area    . ondansetron (ZOFRAN) 4 mg TABS tablet Take 4 tablets by mouth every 8 (eight) hours as needed. 4 tablet 0  . OXALIPLATIN IV Inject into the vein every 14 (fourteen) days. To start November 05, 2014    . prochlorperazine (COMPAZINE) 10 MG tablet Take 1 tablet (10 mg total) by mouth every 6 (six) hours as needed for nausea or vomiting. 60 tablet 0  . tamsulosin (FLOMAX) 0.4 MG CAPS capsule Take 1 capsule (0.4 mg total) by mouth daily. 30 capsule 0  . lidocaine-hydrocortisone (ANAMANTLE) 3-1 % KIT Place 1 application rectally 2 (two) times daily. For two weeks, alternate with Rectiv. (Patient not taking: Reported on 03/31/2015) 28 each 1  .  ondansetron (ZOFRAN ODT) 4 MG disintegrating tablet 31m ODT q4 hours prn nausea/vomit (Patient not taking: Reported on 03/31/2015) 20 tablet 0  . potassium chloride SA (K-DUR,KLOR-CON) 20 MEQ tablet Take 1 tablet (20 mEq total) by mouth 2 (two) times daily. (Patient not taking: Reported on 03/31/2015) 30 tablet 0  . pregabalin (LYRICA) 75 MG capsule Take 1 capsule (75 mg total) by mouth 2 (two) times daily. (Patient not taking: Reported on 03/31/2015) 60 capsule 3   Current Facility-Administered Medications on File Prior to Visit  Medication Dose Route Frequency Provider Last Rate Last Dose  . 0.9 %  sodium chloride infusion   Intravenous Continuous SPatrici Ranks MD        Past Surgical  History  Procedure Laterality Date  . Hypospadias correction    . Laparoscopic partial nephrectomy  2012    right side  . Knee surgery Left     arthroscopy  . Flexible sigmoidoscopy N/A 09/30/2014    Procedure: FLEXIBLE SIGMOIDOSCOPY;  Surgeon: Danie Binder, MD;  Location: [facility];  Service: Endoscopy;  Laterality: N/A;  . Esophageal biopsy N/A 09/30/2014    Procedure: BIOPSY;  Surgeon: Danie Binder, MD;  Location: [facility];  Service: Endoscopy;  Laterality: N/A;  Anal Canal  . Flexible sigmoidoscopy N/A 10/17/2014    Procedure: FLEXIBLE SIGMOIDOSCOPY;  Surgeon: Danie Binder, MD;  Location: [facility] END[facility] SUITE;  Service: Endoscopy;  Laterality: N/A;  1325  . Lung biopsy Right   . Colostomy N/A 02/11/2015    Procedure: COLOSTOMY;  Surgeon: Aviva Signs, MD;  Location: [facility]S;  Service: General;  Laterality: N/A;    Denies any headaches, dizziness, double vision, fevers, chills, night sweats, nausea, vomiting, diarrhea, constipation, chest pain, heart palpitations, shortness of breath, blood in stool, black tarry stool, urinary pain, urinary burning, urinary frequency, hematuria.   PHYSICAL EXAMINATION  ECOG PERFORMANCE STATUS: 1 - Symptomatic but completely ambulatory  Filed Vitals:   03/31/15 0823    BP: 127/82  Pulse: 89  Temp: 98.6 F (37 C)  Resp: 16    GENERAL:alert, no distress, well nourished, well developed, cooperative and smiling, in chemo-bed, unaccompanied SKIN: skin color, texture, turgor are normal, no rashes or significant lesions HEAD: Normocephalic, No masses, lesions, tenderness or abnormalities EYES: normal, PERRLA, EOMI, Conjunctiva are pink and non-injected EARS: External ears normal OROPHARYNX:lips, buccal mucosa, and tongue normal and mucous membranes are moist , poor dentition NECK: supple, trachea midline LYMPH:  no palpable lymphadenopathy BREAST:not examined LUNGS: clear to auscultation  HEART: regular rate & rhythm ABDOMEN:abdomen soft, normal bowel sounds and colostomy producing stool appropriately. BACK: Back symmetric, no curvature., No CVA tenderness EXTREMITIES:less then 2 second capillary refill, no joint deformities, effusion, or inflammation, no skin discoloration, no cyanosis  NEURO: alert & oriented x 3 with fluent speech, no focal motor/sensory deficits, walking with a cane today.   LABORATORY DATA: CBC    Component Value Date/Time   WBC 6.7 03/31/2015 0822   RBC 3.96* 03/31/2015 0822   HGB 12.9* 03/31/2015 0822   HCT 39.2 03/31/2015 0822   PLT 261 03/31/2015 0822   MCV 99.0 03/31/2015 0822   MCH 32.6 03/31/2015 0822   MCHC 32.9 03/31/2015 0822   RDW 15.4 03/31/2015 0822   LYMPHSABS 0.8 03/31/2015 0822   MONOABS 0.6 03/31/2015 0822   EOSABS 0.2 03/31/2015 0822   BASOSABS 0.0 03/31/2015 0822      Chemistry      Component Value Date/Time   NA 136 03/31/2015 0822   K 3.6 03/31/2015 0822   CL 99* 03/31/2015 0822   CO2 27 03/31/2015 0822   BUN <5* 03/31/2015 0822   CREATININE 0.79 03/31/2015 0822      Component Value Date/Time   CALCIUM 8.8* 03/31/2015 0822   ALKPHOS 87 03/31/2015 0822   AST 20 03/31/2015 0822   ALT 15* 03/31/2015 0822   BILITOT 0.3 03/31/2015 0822     Lab Results  Component Value Date   CEA 2.7  03/03/2015     PENDING LABS:   RADIOGRAPHIC STUDIES:  Ct Renal Stone Study  03/22/2015  CLINICAL DATA:  Patient with sudden onset left flank pain. History of renal cell cancer and rectal cancer. EXAM: CT ABDOMEN AND PELVIS  WITHOUT CONTRAST TECHNIQUE: Multidetector CT imaging of the abdomen and pelvis was performed following the standard protocol without IV contrast. COMPARISON:  CT abdomen pelvis 02/26/2015. FINDINGS: Lower chest: Re- demonstrated 9 mm left upper lobe nodule (image 13; series 6). Re- demonstrated 8 mm right lower lobe nodule (image 12; series 6). No pleural effusion. Normal heart size. Hepatobiliary: The liver is normal in size and contour. Gallbladder is unremarkable. No intrahepatic or extrahepatic biliary ductal dilatation. Pancreas: Multiple parenchymal calcifications within the pancreas, unchanged. Spleen: Unremarkable Adrenals/Urinary Tract: Normal adrenal glands. There is moderate left hydroureteronephrosis to the level of the distal left ureter were there is an obstructing 4 mm stone. Additionally there is a small calcification within the posterior right aspect of the urinary bladder (image 78; series 2) which may represent bladder wall calcification or recently passed stone. No right-sided hydronephrosis. Postsurgical changes involving the inferior aspect of the right kidney. Stomach/Bowel: Stable appearance of mild thickening of the rectum. Left lower quadrant colostomy. No evidence for bowel obstruction. No free fluid or free intraperitoneal air. The appendix is normal. Vascular/Lymphatic: Normal caliber abdominal aorta. Peripheral calcified atherosclerotic plaque. No retroperitoneal lymphadenopathy. Other: Central dystrophic calcifications in the prostate. Musculoskeletal: No aggressive or acute appearing osseous lesions. IMPRESSION: There is an obstructing 4 mm stone within the distal left ureter resulting in moderate left hydroureteronephrosis. Small calcific density within  the posterior right aspect of the urinary bladder which may represent bladder wall calcification or recently passed stone. Re- demonstrated pulmonary nodules within the right lower lobe and lingula, compatible with known metastasis. Electronically Signed   By: Lovey Newcomer M.D.   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    Oncology history updated.  ED visit appreciated with CT imaging demonstrating a left obstructing ureter calculus measuring 4 mm in size.  According to ED note, he was referred to urology.  I will refer as well to make sure he has been referred.  Return in 2 weeks for follow-up.  As per usual, 15 minutes was spent talking with the patient, and he again exhibited pain at the end of the appointment time.  Lyrica has been ordered by Dr. [name] and we have completed the prior auth process.  Patient has not gotten approved at this time.  He was given #20 of Percocet after D/C from ED.  He notes that this is more effective for his left flank pain than Dilaudid.  He continues with his Dilaudid.  He is advised to add Tylenol (NOT TO EXCEED 3250 mg/24 hours) to his Fentanyl/Dilaudid pain regimen.  He is using 150 mcg/hr of Fentanyl q 72 hours.    THERAPY PLAN:  Continue treatment as planned monitoring for toxicities and intolerances.  Additionally, we will continue to monitor for progressive disease that would require a change in therapy to second line option(s).  All questions were answered. The patient knows to call the clinic with any problems, questions or concerns. We can certainly see the patient much sooner if necessary.  Patient and plan discussed with Dr. Ancil Linsey and she is in agreement with the aforementioned.   This note is electronically signed by: Doy Mince 03/31/2015 9:38 AM

## 2015-03-31 NOTE — Patient Instructions (Signed)
[facility] Discharge Instructions for Patients Receiving Chemotherapy  Today you received the following chemotherapy agents Oxaliplatin, Leucovorin, Avastin and 5FU pump.  To help prevent nausea and vomiting after your treatment, we encourage you to take your nausea medication as instructed.  If you develop nausea and vomiting that is not controlled by your nausea medication, call the clinic. If it is after clinic hours your family physician or the after hours number for the clinic or go to the Emergency Department. BELOW ARE SYMPTOMS THAT SHOULD BE REPORTED IMMEDIATELY:  *FEVER GREATER THAN 101.0 F  *CHILLS WITH OR WITHOUT FEVER  NAUSEA AND VOMITING THAT IS NOT CONTROLLED WITH YOUR NAUSEA MEDICATION  *UNUSUAL SHORTNESS OF BREATH  *UNUSUAL BRUISING OR BLEEDING  TENDERNESS IN MOUTH AND THROAT WITH OR WITHOUT PRESENCE OF ULCERS  *URINARY PROBLEMS  *BOWEL PROBLEMS  UNUSUAL RASH Items with * indicate a potential emergency and should be followed up as soon as possible.  Return as scheduled Thursday for pump removal.  I have been informed and understand all the instructions given to me. I know to contact the clinic, my physician, or go to the Emergency Department if any problems should occur. I do not have any questions at this time, but understand that I may call the clinic during office hours or the Patient Navigator at [phone] should I have any questions or need assistance in obtaining follow up care.    __________________________________________  _____________  __________ Signature of Patient or Authorized Representative            Date                   Time    __________________________________________ Nurse's Signature

## 2015-03-31 NOTE — Assessment & Plan Note (Addendum)
Stage IV rectal cancer, on systemic chemotherapy with FOLFOX + Avastin.    Oncology history updated.  ED visit appreciated with CT imaging demonstrating a left obstructing ureter calculus measuring 4 mm in size.  According to ED note, he was referred to urology.  I will refer as well to make sure he has been referred.  Return in 2 weeks for follow-up.  As per usual, 15 minutes was spent talking with the patient, and he again exhibited pain at the end of the appointment time.  Lyrica has been ordered by Dr. [name] and we have completed the prior auth process.  Patient has not gotten approved at this time.  He was given #20 of Percocet after D/C from ED.  He notes that this is more effective for his left flank pain than Dilaudid.  He continues with his Dilaudid.  He is advised to add Tylenol (NOT TO EXCEED 3250 mg/24 hours) to his Fentanyl/Dilaudid pain regimen.  He is using 150 mcg/hr of Fentanyl q 72 hours.

## 2015-03-31 NOTE — Progress Notes (Signed)
Referral sent to [facility]. Records faxed on 11/29.  They will contact patient.

## 2015-03-31 NOTE — Patient Instructions (Signed)
[place] at [facility] Discharge Instructions  RECOMMENDATIONS MADE BY THE CONSULTANT AND ANY TEST RESULTS WILL BE SENT TO YOUR REFERRING PHYSICIAN.  Exam and discussion by Robynn Pane, PA-C You can add tylenol to your pain regimen.  Maximum amount is 3000 mg per 24 hours. Report fevers, uncontrolled nausea, vomiting, diarrhea, etc Use antimetics and antidiarrheals as needed  Follow-up in 2 weeks.  Thank you for choosing [place] at [facility] to provide your oncology and hematology care.  To afford each patient quality time with our provider, please arrive at least 15 minutes before your scheduled appointment time.    You need to re-schedule your appointment should you arrive 10 or more minutes late.  We strive to give you quality time with our providers, and arriving late affects you and other patients whose appointments are after yours.  Also, if you no show three or more times for appointments you may be dismissed from the clinic at the providers discretion.     Again, thank you for choosing [facility].  Our hope is that these requests will decrease the amount of time that you wait before being seen by our physicians.       _____________________________________________________________  Should you have questions after your visit to [facility], please contact our office at [phone] between the hours of 8:30 a.m. and 4:30 p.m.  Voicemails left after 4:30 p.m. will not be returned until the following business day.  For prescription refill requests, have your pharmacy contact our office.

## 2015-03-31 NOTE — Progress Notes (Signed)
Tolerated chemo well. Ambulatory on discharge to self to home. Continuous infusion pump  Intact.

## 2015-04-01 LAB — CEA: CEA: 3.3 ng/mL (ref 0.0–4.7)

## 2015-04-02 ENCOUNTER — Encounter (HOSPITAL_COMMUNITY): Payer: Medicaid Other | Attending: Hematology & Oncology

## 2015-04-02 ENCOUNTER — Other Ambulatory Visit (HOSPITAL_COMMUNITY): Payer: Self-pay | Admitting: Oncology

## 2015-04-02 ENCOUNTER — Encounter (HOSPITAL_COMMUNITY): Payer: Self-pay

## 2015-04-02 VITALS — BP 149/85 | HR 78 | Temp 98.0°F | Resp 18

## 2015-04-02 DIAGNOSIS — C20 Malignant neoplasm of rectum: Secondary | ICD-10-CM

## 2015-04-02 DIAGNOSIS — R918 Other nonspecific abnormal finding of lung field: Secondary | ICD-10-CM | POA: Insufficient documentation

## 2015-04-02 DIAGNOSIS — C7800 Secondary malignant neoplasm of unspecified lung: Secondary | ICD-10-CM

## 2015-04-02 DIAGNOSIS — Z452 Encounter for adjustment and management of vascular access device: Secondary | ICD-10-CM | POA: Diagnosis present

## 2015-04-02 DIAGNOSIS — C7801 Secondary malignant neoplasm of right lung: Secondary | ICD-10-CM | POA: Diagnosis not present

## 2015-04-02 MED ORDER — HYDROMORPHONE HCL 4 MG PO TABS: 4.0000 mg | ORAL_TABLET | ORAL | Status: DC | PRN

## 2015-04-02 MED ORDER — HEPARIN SOD (PORK) LOCK FLUSH 100 UNIT/ML IV SOLN
500.0000 [IU] | Freq: Once | INTRAVENOUS | Status: AC | PRN
  Administered 2015-04-02: 500 [IU]

## 2015-04-02 MED ORDER — HEPARIN SOD (PORK) LOCK FLUSH 100 UNIT/ML IV SOLN
INTRAVENOUS | Status: AC
  Filled 2015-04-02: qty 5

## 2015-04-02 MED ORDER — SODIUM CHLORIDE 0.9 % IJ SOLN
10.0000 mL | INTRAMUSCULAR | Status: DC | PRN
  Administered 2015-04-02: 10 mL
  Filled 2015-04-02: qty 10

## 2015-04-02 NOTE — Progress Notes (Signed)
1250:  Andrew Kline presents to have home infusion pump d/c'd and for port-a-cath deaccess with flush.  Portacath flushed with NS and 500U/44m Heparin, and needle removed intact.  Procedure tolerated well and without incident.

## 2015-04-02 NOTE — Patient Instructions (Signed)
[place] at [facility] Discharge Instructions  RECOMMENDATIONS MADE BY THE CONSULTANT AND ANY TEST RESULTS WILL BE SENT TO YOUR REFERRING PHYSICIAN.  Pump removal and port flush today. Return as scheduled for chemotherapy.   Thank you for choosing [place] at [facility] to provide your oncology and hematology care.  To afford each patient quality time with our provider, please arrive at least 15 minutes before your scheduled appointment time.    You need to re-schedule your appointment should you arrive 10 or more minutes late.  We strive to give you quality time with our providers, and arriving late affects you and other patients whose appointments are after yours.  Also, if you no show three or more times for appointments you may be dismissed from the clinic at the providers discretion.     Again, thank you for choosing [facility].  Our hope is that these requests will decrease the amount of time that you wait before being seen by our physicians.       _____________________________________________________________  Should you have questions after your visit to [facility], please contact our office at [phone] between the hours of 8:30 a.m. and 4:30 p.m.  Voicemails left after 4:30 p.m. will not be returned until the following business day.  For prescription refill requests, have your pharmacy contact our office.

## 2015-04-06 ENCOUNTER — Other Ambulatory Visit (HOSPITAL_COMMUNITY): Payer: Self-pay | Admitting: Oncology

## 2015-04-06 DIAGNOSIS — C7800 Secondary malignant neoplasm of unspecified lung: Secondary | ICD-10-CM

## 2015-04-06 DIAGNOSIS — C20 Malignant neoplasm of rectum: Secondary | ICD-10-CM

## 2015-04-06 DIAGNOSIS — K6289 Other specified diseases of anus and rectum: Secondary | ICD-10-CM

## 2015-04-06 MED ORDER — FENTANYL 75 MCG/HR TD PT72: 150.0000 ug | MEDICATED_PATCH | TRANSDERMAL | Status: DC

## 2015-04-06 NOTE — Progress Notes (Signed)
Patient's pain medication needs have been

## 2015-04-07 ENCOUNTER — Ambulatory Visit (INDEPENDENT_AMBULATORY_CARE_PROVIDER_SITE_OTHER): Payer: Medicaid Other | Admitting: Urology

## 2015-04-07 ENCOUNTER — Ambulatory Visit (HOSPITAL_COMMUNITY)
Admission: RE | Admit: 2015-04-07 | Discharge: 2015-04-07 | Disposition: A | Discharge status: Home / Self Care | Payer: Medicaid Other | Source: Ambulatory Visit | Attending: Urology | Admitting: Urology

## 2015-04-07 ENCOUNTER — Encounter: Payer: Self-pay | Admitting: *Deleted

## 2015-04-07 ENCOUNTER — Other Ambulatory Visit: Payer: Self-pay | Admitting: Urology

## 2015-04-07 ENCOUNTER — Telehealth (HOSPITAL_COMMUNITY): Payer: Self-pay | Admitting: Hematology & Oncology

## 2015-04-07 DIAGNOSIS — K8689 Other specified diseases of pancreas: Secondary | ICD-10-CM | POA: Insufficient documentation

## 2015-04-07 DIAGNOSIS — N201 Calculus of ureter: Secondary | ICD-10-CM

## 2015-04-07 DIAGNOSIS — C20 Malignant neoplasm of rectum: Secondary | ICD-10-CM | POA: Diagnosis not present

## 2015-04-07 DIAGNOSIS — R109 Unspecified abdominal pain: Secondary | ICD-10-CM | POA: Insufficient documentation

## 2015-04-07 NOTE — Progress Notes (Signed)
[place] Clinical Social Work  Clinical Social Work was referred by patient when he was at [facility] picking up prescriptions. Clinical Social Worker met with patient at [facility] to offer support and assess for needs. Pt shared he felt like he had more energy lately and was trying to do more. Pt shared that him and his wife are trying to obtain custody of her daughters and this is challenging. CSW provided supportive listening and support. Pt denied other needs currently. CSW also encouraged him to attend [facility] next week.   Clinical Social Work interventions: Emotional support   [place] Alyssa Mancera, [place] Tuesdays   [phone]

## 2015-04-08 ENCOUNTER — Telehealth (HOSPITAL_COMMUNITY): Payer: Self-pay | Admitting: *Deleted

## 2015-04-08 ENCOUNTER — Other Ambulatory Visit (HOSPITAL_COMMUNITY): Payer: Self-pay | Admitting: *Deleted

## 2015-04-08 NOTE — Telephone Encounter (Signed)
Company for ostomy supplies called today and said they needed this information updated on their original orders from Dr. [name]. Collie Siad said they called and asked for this same information 2 weeks ago. Collie Siad said they were going to fax a paper for Dr. [name] to update however the fax never came through. I spoke with rep from company today and they said they would fax over the form. I said I am here @ the fax machine if you will go ahead and fax it. No form yet. But the following is the information that they need on the form.   ICD.10 code C20 Rectal Cancer, Date of onset 02/12/15 - needs MD date & initial

## 2015-04-13 NOTE — Progress Notes (Signed)
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    09/25/2014 Imaging CT abd/pelvis- Indeterminate nodules within the bilateral lung bases with dominant approximately 1.7 cm centrally necrotic nodule within the right lower lobe...   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   10/03/2014 Tumor Marker CEA NOT ELEVATED   10/09/2014 Imaging CT Chest- Bilateral pulmonary nodules of varying size are most consistent with pulmonary metastasis. There are approximately 6 lesions in each lung.   10/17/2014 Pathology Results Rectum, biopsy - INVASIVE ADENOCARCINOMA   10/29/2014 Pathology Results Diagnosis Lung, needle/core biopsy(ies), right lower lobe - METASTATIC ADENOCARCINOMA   11/04/2014 - 12/18/2014 Radiation Therapy Dr. Lisbeth Renshaw   11/05/2014 -  Chemotherapy FOLFOX + Avastin   02/11/2015 Surgery Colostomy formation by Dr. Arnoldo Morale.         HISTORY OF PRESENTING ILLNESS:  Damonta Cossey 48 y.o. male is here because of stage IV rectal cancer.  He is here alone today. He goes by [name]. [name]   He is actually doing well. Lyrica and fentanyl have dramatically improved his pain. He notes he has finally been able to be up an cooking and doing activities around the home. His parents have been down to visit and he notes he was able to enjoy that time. He made christmas cookies. His ostomy is fine. He is pleasant today and mood is good.  He has been breaking out where his fentanyl patches  are. He notes this just started. He has obvious sites of contact dermatitis on the shoulders bilaterally.  MEDICAL HISTORY:  Past Medical History  Diagnosis Date  . Rectal pain   . Arthritis   . Renal cell cancer ([place])     2012.   [name] Rectal cancer ([place])   . Shortness of breath dyspnea   . Anxiety   . Depression   . Kidney stone 03/22/15    left    SURGICAL HISTORY: Past Surgical History  Procedure Laterality Date  . Hypospadias correction    . Laparoscopic partial nephrectomy  2012    right side  . Knee surgery Left     arthroscopy  . Flexible sigmoidoscopy N/A 09/30/2014    Procedure: FLEXIBLE SIGMOIDOSCOPY;  Surgeon: Danie Binder, MD;  Location: [facility] OR[facility];  Service: Endoscopy;  Laterality: N/A;  . Esophageal biopsy N/A 09/30/2014    Procedure: BIOPSY;  Surgeon: Danie Binder, MD;  Location: [facility] ORS;  Service: Endoscopy;  Laterality: N/A;  Anal Canal  . Flexible sigmoidoscopy N/A 10/17/2014    Procedure: FLEXIBLE SIGMOIDOSCOPY;  Surgeon: Danie Binder, MD;  Location: [facility] ENDO SUITE;  Service: Endoscopy;  Laterality: N/A;  1325  . Lung biopsy Right   . Colostomy N/A 02/11/2015    Procedure: COLOSTOMY;  Surgeon: Aviva Signs, MD;  Location: [facility] ORS;  Service: General;  Laterality: N/A;    SOCIAL HISTORY: Social History   Social History  . Marital Status: Divorced    Spouse Name: N/A  . Number of Children: 2  . Years of Education:  N/A   Occupational History  . retail    Social History Main Topics  . Smoking status: Current Every Day Smoker -- 0.50 packs/day for 30 years    Types: Cigarettes  . Smokeless tobacco: Not on file     Comment: a little over a pack daily  . Alcohol Use: 0.0 oz/week    0 Standard drinks or equivalent per week     Comment: Occ  . Drug Use: No  . Sexual Activity: Yes    Birth Control/ Protection: None   Other Topics Concern  . Not on file   Social History Narrative  He is from [place], [place]. Moved here in March. Smoker, 1 ppd. Was  1.5 ppd. ETOH, none. 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 He is divorced. Lives with fiance. 2 children. 22 and 65 yo. No grandchildren.  FAMILY HISTORY: Family History  Problem Relation Age of Onset  . Lung cancer Maternal Grandfather   . Colon cancer Maternal Uncle     age 15  . Diabetes Other   . Healthy Mother    indicated that his mother is alive. He indicated that his father is alive.   Mother living, 61 yo. Still working as a Audiological scientist. Father living, 76-70 yo. 1 brother, 1 sister, 1 step sister. Maternal uncle has colon cancer. He is married to a GI nurse. So he has people to go to with questions and is informed on what is happening. History of diabetes in the family. No history of heart disease in the family.  ALLERGIES:  has No Known Allergies.  MEDICATIONS:  Current Outpatient Prescriptions  Medication Sig Dispense Refill  . amitriptyline (ELAVIL) 50 MG tablet Take 1 tablet (50 mg total) by mouth at bedtime. 30 tablet 2  . Bevacizumab (AVASTIN IV) Inject into the vein every 14 (fourteen) days. To start November 19, 2014    . citalopram (CELEXA) 40 MG tablet TAKE 1/2 TABLET BY MOUTH FOR 5 DAYS  THEN ONE TABLET DAILY THEREAFTER. (Patient taking differently: Take 40 mg by mouth daily. ) 30 tablet 1  . dextrose 5 % SOLN 1,000 mL with fluorouracil 5 GM/100ML SOLN Inject into the vein every 14 (fourteen) days. To start November 05, 2014. To infuse over 46 hours via pump.    [name] docusate sodium (COLACE) 100 MG capsule Take 1 capsule (100 mg total) by mouth every 12 (twelve) hours. (Patient taking differently: Take 100 mg by mouth at bedtime. ) 60 capsule 0  . DULoxetine (CYMBALTA) 30 MG capsule Take one capsule daily for 5 days then 2 daily thereafter (Patient taking differently: Take 30 mg by mouth daily. ) 60 capsule 3  . fentaNYL (DURAGESIC - DOSED MCG/HR) 75 MCG/HR Place 2 patches (150  mcg total) onto the skin every 3 (three) days. 10 patch 0  . HYDROmorphone (DILAUDID) 4 MG tablet Take 1 tablet (4 mg total) by mouth every 4 (four) hours as needed for severe pain. 60 tablet 0  . leucovorin 50 MG injection Inject into the vein every 14 (fourteen) days. To start November 05, 2014    . lidocaine-prilocaine (EMLA) cream Apply a quarter size amount to port site 1 hour prior to chemo. Do not rub in. Cover with plastic wrap. 30 g 3  . ondansetron (ZOFRAN) 4 mg TABS tablet Take 4 tablets by mouth every 8 (eight) hours as needed. 4 tablet 0  .  OXALIPLATIN IV Inject into the vein every 14 (fourteen) days. To start November 05, 2014    . lidocaine-hydrocortisone (ANAMANTLE) 3-1 % KIT Place 1 application rectally 2 (two) times daily. For two weeks, alternate with Rectiv. (Patient not taking: Reported on 03/31/2015) 28 each 1  . nitroGLYCERIN (NITROGLYN) 2 % ointment Apply 0.5 inches topically daily. Anal area    . ondansetron (ZOFRAN ODT) 4 MG disintegrating tablet 40m ODT q4 hours prn nausea/vomit (Patient not taking: Reported on 03/31/2015) 20 tablet 0  . potassium chloride SA (K-DUR,KLOR-CON) 20 MEQ tablet Take 1 tablet (20 mEq total) by mouth 2 (two) times daily. (Patient not taking: Reported on 03/31/2015) 30 tablet 0  . pregabalin (LYRICA) 150 MG capsule Take 1 capsule (150 mg total) by mouth 2 (two) times daily. 60 capsule 0  . prochlorperazine (COMPAZINE) 10 MG tablet Take 1 tablet (10 mg total) by mouth every 6 (six) hours as needed for nausea or vomiting. (Patient not taking: Reported on 04/14/2015) 60 tablet 0  . tamsulosin (FLOMAX) 0.4 MG CAPS capsule Take 1 capsule (0.4 mg total) by mouth daily. (Patient not taking: Reported on 04/14/2015) 30 capsule 0   No current facility-administered medications for this visit.   Facility-Administered Medications Ordered in Other Visits  Medication Dose Route Frequency Provider Last Rate Last Dose  . 0.9 %  sodium chloride infusion   Intravenous  Continuous SPatrici Ranks MD        Review of Systems  Constitutional: Positive for malaise/fatigue, improving.  Respiratory: Negative for sputum production.   Gastrointestinal: Negative for blood in stool, diarrhea Genitourinary: Positive for rectal pain, finally improving Psychiatric/Behavioral: Positive for ANXIETY.  14 point ROS was done and is otherwise as detailed above or in HPI   PHYSICAL EXAMINATION:  ECOG PERFORMANCE STATUS: 1 - Symptomatic but completely ambulatory  Filed Vitals:   04/14/15 0832  BP: 145/85  Pulse: 97  Temp: 98.1 F (36.7 C)  Resp: 18   Filed Weights   04/14/15 0832  Weight: 176 lb 6.4 oz (80.015 kg)    Physical Exam  Constitutional: He is oriented to person, place, and time and well-developed, well-nourished, and in no distress.  HENT:  Head: Normocephalic and atraumatic.  Nose: Nose normal.  Mouth/Throat: Oropharynx is clear and moist. No oropharyngeal exudate.  Dentures on top.  Eyes: Conjunctivae and EOM are normal. Pupils are equal, round, and reactive to light. Right eye exhibits no discharge. Left eye exhibits no discharge. No scleral icterus.  Neck: Normal range of motion. Neck supple. No tracheal deviation present. No thyromegaly present.  Cardiovascular: Normal rate, regular rhythm and normal heart sounds.  Exam reveals no gallop and no friction rub.   No murmur heard. Pulmonary/Chest: Effort normal and breath sounds normal. He has no wheezes. He has no rales.  Abdominal: Soft. Bowel sounds are normal. He exhibits no distension and no mass. There is no tenderness. There is no rebound and no guarding. Ostomy is c/d/i  Genitourinary:   Musculoskeletal: Normal range of motion. He exhibits no edema.  Lymphadenopathy:    He has no cervical adenopathy.  Neurological: He is alert and oriented to person, place, and time. He has normal reflexes. No cranial nerve deficit. Gait normal. Coordination normal.  Skin: Skin is warm and dry. No  rash noted.  Psychiatric: Mood, memory, affect and judgment normal.  Nursing note and vitals reviewed.   LABORATORY DATA:  I have reviewed the data as listed Lab Results  Component Value Date  WBC 6.7 03/31/2015   HGB 12.9* 03/31/2015   HCT 39.2 03/31/2015   MCV 99.0 03/31/2015   PLT 261 03/31/2015   CMP     Component Value Date/Time   NA 136 03/31/2015 0822   K 3.6 03/31/2015 0822   CL 99* 03/31/2015 0822   CO2 27 03/31/2015 0822   GLUCOSE 108* 03/31/2015 0822   BUN <5* 03/31/2015 0822   CREATININE 0.79 03/31/2015 0822   CALCIUM 8.8* 03/31/2015 0822   PROT 7.2 03/31/2015 0822   ALBUMIN 3.4* 03/31/2015 0822   AST 20 03/31/2015 0822   ALT 15* 03/31/2015 0822   ALKPHOS 87 03/31/2015 0822   BILITOT 0.3 03/31/2015 0822   GFRNONAA >60 03/31/2015 0822   GFRAA >60 03/31/2015 7829   RADIOLOGY: I have personally reviewed the radiological images as listed and agreed with the findings in the report.  CLINICAL DATA: Rectal cancer.  EXAM: CT CHEST, ABDOMEN, AND PELVIS WITH CONTRAST  TECHNIQUE: Multidetector CT imaging of the chest, abdomen and pelvis was performed following the standard protocol during bolus administration of intravenous contrast.  CONTRAST: 136m OMNIPAQUE IOHEXOL 300 MG/ML SOLN  COMPARISON: 10/09/2014  FINDINGS: CT CHEST FINDINGS  Mediastinum/Nodes: The heart size is normal. The trachea appears patent and midline. Normal appearance of the esophagus. No axillary or supraclavicular adenopathy.  Lungs/Pleura: No pleural fluid identified. 5 mm left upper lobe pulmonary nodule is unchanged, image 8 of series 6. 5 mm left lower lobe pulmonary nodule appears less solid on today's exam, image 21 of series 3. The right upper lobe pulmonary nodules have resolved in the interval. The lingular nodule measures 1 cm and appears less solid than on the previous exam when it measured 11 mm, image 36 of series 6. Significant interval improvement in  dominant nodule involving the right lower lobe. On today's study this is appears mostly cystic with a 10 mm peripheral solid component remaining, image 25 of series 6. Previously this measured 17 mm.  Musculoskeletal: Thoracic spondylosis noted. No aggressive lytic or sclerotic bone lesions identified.  CT ABDOMEN PELVIS FINDINGS  Hepatobiliary: There is no suspicious liver abnormality. The gallbladder appears normal. No biliary dilatation.  Pancreas: scattered calcifications noted throughout the pancreas. No pseudocyst. No mass identified.  Spleen: Negative  Adrenals/Urinary Tract: The adrenal glands are negative. Normal appearance of the kidneys. No obstructive uropathy. The urinary bladder appears normal.  Stomach/Bowel: The stomach appears normal. The small bowel loops have a normal course and caliber and there is no evidence for bowel obstruction. The appendix is visualized and appears normal. Left lower quadrant colostomy is present. Hartman's pouch noted.  Vascular/Lymphatic: Calcified atherosclerotic disease involves the abdominal aorta. No aneurysm. No enlarged retroperitoneal or mesenteric adenopathy. No enlarged pelvic or inguinal lymph nodes.  Reproductive: The prostate gland and seminal vesicles appear normal.  Other: There is no ascites or focal fluid collections within the abdomen or pelvis. No peritoneal nodularity identified.  Musculoskeletal: No aggressive lytic or sclerotic bone lesions.  IMPRESSION: 1. Interval response to therapy with decrease in size of multiple bilateral pulmonary nodules. No new or progressive pulmonary metastasis. 2. No evidence for metastatic disease within the abdomen or pelvis. 3. Aortic atherosclerosis.   Electronically Signed  By: TKerby MoorsM.D.  On: 02/26/2015 09:28  ASSESSMENT & PLAN:  STAGE IV Rectal vs. Anal carcinoma, adenocarcinoma Pulmonary metastases Good PS [facility] partial nephrectomy  [facility]AVAST[facility] for palliation Colostomy for palliation of poor bowel control Lyrica for  pain, with marked improvement in QOL and pain scores Contact Dermatitis from Fentanyl patch   He overall looks well. He is doing so much better and that is an obvious relief. Lyrica has drastically reduced his pain. I have increased it today to 300 mg daily total. I have advised him to apply OTC hydrocortisone to the areas of contact dermatitis from his patches. If this continues we will reassess.  We addressed his living will. I continue to discuss smoking cessation. Labs from today are pending and will be cleared prior to administration of his chemotherapy.  He has no peripheral neuropathy. We will continue with FOLFOX/AVASTIN. We will discuss re-imaging after the Holidays.  This note was electronically signed.    Kelby Fam. [name], MD

## 2015-04-14 ENCOUNTER — Encounter (HOSPITAL_BASED_OUTPATIENT_CLINIC_OR_DEPARTMENT_OTHER): Payer: Medicaid Other

## 2015-04-14 ENCOUNTER — Encounter (HOSPITAL_BASED_OUTPATIENT_CLINIC_OR_DEPARTMENT_OTHER): Payer: Medicaid Other | Admitting: Hematology & Oncology

## 2015-04-14 ENCOUNTER — Encounter (HOSPITAL_COMMUNITY): Payer: Medicaid Other

## 2015-04-14 ENCOUNTER — Encounter (HOSPITAL_COMMUNITY): Payer: Self-pay | Admitting: Hematology & Oncology

## 2015-04-14 VITALS — BP 145/85 | HR 97 | Temp 98.1°F | Resp 18 | Wt 176.4 lb

## 2015-04-14 VITALS — BP 151/83 | HR 79 | Temp 98.2°F | Resp 18

## 2015-04-14 DIAGNOSIS — C801 Malignant (primary) neoplasm, unspecified: Secondary | ICD-10-CM

## 2015-04-14 DIAGNOSIS — Z5111 Encounter for antineoplastic chemotherapy: Secondary | ICD-10-CM | POA: Diagnosis not present

## 2015-04-14 DIAGNOSIS — R52 Pain, unspecified: Secondary | ICD-10-CM

## 2015-04-14 DIAGNOSIS — K6289 Other specified diseases of anus and rectum: Secondary | ICD-10-CM

## 2015-04-14 DIAGNOSIS — C20 Malignant neoplasm of rectum: Secondary | ICD-10-CM | POA: Diagnosis not present

## 2015-04-14 DIAGNOSIS — L251 Unspecified contact dermatitis due to drugs in contact with skin: Secondary | ICD-10-CM | POA: Diagnosis not present

## 2015-04-14 DIAGNOSIS — Z720 Tobacco use: Secondary | ICD-10-CM | POA: Diagnosis not present

## 2015-04-14 DIAGNOSIS — R918 Other nonspecific abnormal finding of lung field: Secondary | ICD-10-CM | POA: Diagnosis not present

## 2015-04-14 DIAGNOSIS — C7800 Secondary malignant neoplasm of unspecified lung: Secondary | ICD-10-CM | POA: Diagnosis not present

## 2015-04-14 DIAGNOSIS — Z5112 Encounter for antineoplastic immunotherapy: Secondary | ICD-10-CM

## 2015-04-14 LAB — CBC WITH DIFFERENTIAL/PLATELET
BASOS PCT: 0 %
Basophils Absolute: 0 10*3/uL (ref 0.0–0.1)
Eosinophils Absolute: 0.2 10*3/uL (ref 0.0–0.7)
Eosinophils Relative: 4 %
HEMATOCRIT: 37.9 % — AB (ref 39.0–52.0)
Hemoglobin: 12.6 g/dL — ABNORMAL LOW (ref 13.0–17.0)
LYMPHS ABS: 0.8 10*3/uL (ref 0.7–4.0)
Lymphocytes Relative: 13 %
MCH: 32.6 pg (ref 26.0–34.0)
MCHC: 33.2 g/dL (ref 30.0–36.0)
MCV: 98.2 fL (ref 78.0–100.0)
MONO ABS: 0.7 10*3/uL (ref 0.1–1.0)
MONOS PCT: 11 %
NEUTROS ABS: 4.5 10*3/uL (ref 1.7–7.7)
Neutrophils Relative %: 72 %
Platelets: 223 10*3/uL (ref 150–400)
RBC: 3.86 MIL/uL — ABNORMAL LOW (ref 4.22–5.81)
RDW: 15.6 % — AB (ref 11.5–15.5)
WBC: 6.2 10*3/uL (ref 4.0–10.5)

## 2015-04-14 LAB — COMPREHENSIVE METABOLIC PANEL
ALBUMIN: 3.4 g/dL — AB (ref 3.5–5.0)
ALT: 15 U/L — ABNORMAL LOW (ref 17–63)
ANION GAP: 11 (ref 5–15)
AST: 21 U/L (ref 15–41)
Alkaline Phosphatase: 97 U/L (ref 38–126)
BILIRUBIN TOTAL: 0.2 mg/dL — AB (ref 0.3–1.2)
BUN: 7 mg/dL (ref 6–20)
CALCIUM: 8.9 mg/dL (ref 8.9–10.3)
CO2: 29 mmol/L (ref 22–32)
Chloride: 99 mmol/L — ABNORMAL LOW (ref 101–111)
Creatinine, Ser: 0.76 mg/dL (ref 0.61–1.24)
GLUCOSE: 159 mg/dL — AB (ref 65–99)
POTASSIUM: 3.5 mmol/L (ref 3.5–5.1)
Sodium: 139 mmol/L (ref 135–145)
TOTAL PROTEIN: 6.7 g/dL (ref 6.5–8.1)

## 2015-04-14 LAB — URINALYSIS, DIPSTICK ONLY
BILIRUBIN URINE: NEGATIVE
GLUCOSE, UA: NEGATIVE mg/dL
HGB URINE DIPSTICK: NEGATIVE
KETONES UR: NEGATIVE mg/dL
LEUKOCYTES UA: NEGATIVE
NITRITE: NEGATIVE
PROTEIN: NEGATIVE mg/dL
Specific Gravity, Urine: 1.015 (ref 1.005–1.030)
pH: 6 (ref 5.0–8.0)

## 2015-04-14 MED ORDER — SODIUM CHLORIDE 0.9 % IV SOLN
5.0000 mg/kg | Freq: Once | INTRAVENOUS | Status: AC
  Administered 2015-04-14: 400 mg via INTRAVENOUS
  Filled 2015-04-14: qty 16

## 2015-04-14 MED ORDER — DEXTROSE 5 % IV SOLN
Freq: Once | INTRAVENOUS | Status: AC
  Administered 2015-04-14: 10:00:00 via INTRAVENOUS

## 2015-04-14 MED ORDER — OXALIPLATIN CHEMO INJECTION 100 MG/20ML
85.0000 mg/m2 | Freq: Once | INTRAVENOUS | Status: AC
  Administered 2015-04-14: 165 mg via INTRAVENOUS
  Filled 2015-04-14: qty 33

## 2015-04-14 MED ORDER — SODIUM CHLORIDE 0.9 % IV SOLN
Freq: Once | INTRAVENOUS | Status: AC
  Administered 2015-04-14: 10:00:00 via INTRAVENOUS
  Filled 2015-04-14: qty 4

## 2015-04-14 MED ORDER — PREGABALIN 75 MG PO CAPS: 150.0000 mg | ORAL_CAPSULE | Freq: Two times a day (BID) | ORAL | Status: DC

## 2015-04-14 MED ORDER — PREGABALIN 150 MG PO CAPS: 150.0000 mg | ORAL_CAPSULE | Freq: Two times a day (BID) | ORAL | Status: DC

## 2015-04-14 MED ORDER — SODIUM CHLORIDE 0.9 % IJ SOLN
10.0000 mL | INTRAMUSCULAR | Status: DC | PRN
  Administered 2015-04-14: 10 mL
  Filled 2015-04-14: qty 10

## 2015-04-14 MED ORDER — FLUOROURACIL CHEMO INJECTION 5 GM/100ML
2400.0000 mg/m2 | INTRAVENOUS | Status: DC
  Administered 2015-04-14: 4650 mg via INTRAVENOUS
  Filled 2015-04-14: qty 93

## 2015-04-14 MED ORDER — LEUCOVORIN CALCIUM INJECTION 350 MG
400.0000 mg/m2 | Freq: Once | INTRAVENOUS | Status: AC
  Administered 2015-04-14: 776 mg via INTRAVENOUS
  Filled 2015-04-14: qty 38.8

## 2015-04-14 MED ORDER — FENTANYL 75 MCG/HR TD PT72: 150.0000 ug | MEDICATED_PATCH | TRANSDERMAL | Status: DC

## 2015-04-14 MED ORDER — SODIUM CHLORIDE 0.9 % IV SOLN
Freq: Once | INTRAVENOUS | Status: AC
  Administered 2015-04-14: 13:00:00 via INTRAVENOUS

## 2015-04-14 NOTE — Patient Instructions (Addendum)
[place] at [facility] Discharge Instructions  RECOMMENDATIONS MADE BY THE CONSULTANT AND ANY TEST RESULTS WILL BE SENT TO YOUR REFERRING PHYSICIAN.   Exam completed by Dr [name] today Chemotherapy as planned today Return on Thursday to have pump removed  Contact dermatitis from patches, try over the counter cortisone  Lyrica 150 mg sent to pharmacy you can take twice a day Fentanyl pacth refill Return to see the doctor in 2 weeks Please call the clinic if you have any questions or concerns    Thank you for choosing [place] at [facility] to provide your oncology and hematology care.  To afford each patient quality time with our provider, please arrive at least 15 minutes before your scheduled appointment time.    You need to re-schedule your appointment should you arrive 10 or more minutes late.  We strive to give you quality time with our providers, and arriving late affects you and other patients whose appointments are after yours.  Also, if you no show three or more times for appointments you may be dismissed from the clinic at the providers discretion.     Again, thank you for choosing [facility].  Our hope is that these requests will decrease the amount of time that you wait before being seen by our physicians.       _____________________________________________________________  Should you have questions after your visit to [facility], please contact our office at [phone] between the hours of 8:30 a.m. and 4:30 p.m.  Voicemails left after 4:30 p.m. will not be returned until the following business day.  For prescription refill requests, have your pharmacy contact our office.      Contact Dermatitis Dermatitis is redness, soreness, and swelling (inflammation) of the skin. Contact dermatitis is a reaction to certain substances that touch the skin. There are two types of contact dermatitis:    Irritant contact dermatitis. This type is caused by something that irritates your skin, such as dry hands from washing them too much. This type does not require previous exposure to the substance for a reaction to occur. This type is more common.  Allergic contact dermatitis. This type is caused by a substance that you are allergic to, such as a nickel allergy or poison ivy. This type only occurs if you have been exposed to the substance (allergen) before. Upon a repeat exposure, your body reacts to the substance. This type is less common. CAUSES  Many different substances can cause contact dermatitis. Irritant contact dermatitis is most commonly caused by exposure to:   Makeup.   Soaps.   Detergents.   Bleaches.   Acids.   Metal salts, such as nickel.  Allergic contact dermatitis is most commonly caused by exposure to:   Poisonous plants.   Chemicals.   Jewelry.   Latex.   Medicines.   Preservatives in products, such as clothing.  RISK FACTORS This condition is more likely to develop in:   People who have jobs that expose them to irritants or allergens.  People who have certain medical conditions, such as asthma or eczema.  SYMPTOMS  Symptoms of this condition may occur anywhere on your body where the irritant has touched you or is touched by you. Symptoms include:  Dryness or flaking.   Redness.   Cracks.   Itching.   Pain or a burning feeling.   Blisters.  Drainage of small amounts of blood or clear fluid  from skin cracks. With allergic contact dermatitis, there may also be swelling in areas such as the eyelids, mouth, or genitals.  DIAGNOSIS  This condition is diagnosed with a medical history and physical exam. A patch skin test may be performed to help determine the cause. If the condition is related to your job, you may need to see an occupational medicine specialist. TREATMENT Treatment for this condition includes figuring out what  caused the reaction and protecting your skin from further contact. Treatment may also include:   Steroid creams or ointments. Oral steroid medicines may be needed in more severe cases.  Antibiotics or antibacterial ointments, if a skin infection is present.  Antihistamine lotion or an antihistamine taken by mouth to ease itching.  A bandage (dressing). HOME CARE INSTRUCTIONS Skin Care  Moisturize your skin as needed.   Apply cool compresses to the affected areas.  Try taking a bath with:  Epsom salts. Follow the instructions on the packaging. You can get these at your local pharmacy or grocery store.  Baking soda. Pour a small amount into the bath as directed by your health care provider.  Colloidal oatmeal. Follow the instructions on the packaging. You can get this at your local pharmacy or grocery store.  Try applying baking soda paste to your skin. Stir water into baking soda until it reaches a paste-like consistency.  Do not scratch your skin.  Bathe less frequently, such as every other day.  Bathe in lukewarm water. Avoid using hot water. Medicines  Take or apply over-the-counter and prescription medicines only as told by your health care provider.   If you were prescribed an antibiotic medicine, take or apply your antibiotic as told by your health care provider. Do not stop using the antibiotic even if your condition starts to improve. General Instructions  Keep all follow-up visits as told by your health care provider. This is important.  Avoid the substance that caused your reaction. If you do not know what caused it, keep a journal to try to track what caused it. Write down:  What you eat.  What cosmetic products you use.  What you drink.  What you wear in the affected area. This includes jewelry.  If you were given a dressing, take care of it as told by your health care provider. This includes when to change and remove it. SEEK MEDICAL CARE IF:    Your condition does not improve with treatment.  Your condition gets worse.  You have signs of infection such as swelling, tenderness, redness, soreness, or warmth in the affected area.  You have a fever.  You have new symptoms. SEEK IMMEDIATE MEDICAL CARE IF:   You have a severe headache, neck pain, or neck stiffness.  You vomit.  You feel very sleepy.  You notice red streaks coming from the affected area.  Your bone or joint underneath the affected area becomes painful after the skin has healed.  The affected area turns darker.  You have difficulty breathing.   This information is not intended to replace advice given to you by your health care provider. Make sure you discuss any questions you have with your health care provider.   Document Released: 04/15/2000 Document Revised: 01/07/2015 Document Reviewed: 09/03/2014 Elsevier Interactive Patient Education [employer].

## 2015-04-14 NOTE — Progress Notes (Signed)
1350:  Tolerated tx w/o adverse reaction.  A&Ox4, in no distress.  VSS.  Discharged ambulatory.

## 2015-04-16 ENCOUNTER — Encounter (HOSPITAL_COMMUNITY): Payer: Self-pay

## 2015-04-16 ENCOUNTER — Encounter (HOSPITAL_BASED_OUTPATIENT_CLINIC_OR_DEPARTMENT_OTHER): Payer: Medicaid Other

## 2015-04-16 VITALS — BP 151/89 | HR 94 | Temp 98.1°F | Resp 18

## 2015-04-16 DIAGNOSIS — C7800 Secondary malignant neoplasm of unspecified lung: Secondary | ICD-10-CM

## 2015-04-16 DIAGNOSIS — Z452 Encounter for adjustment and management of vascular access device: Secondary | ICD-10-CM

## 2015-04-16 DIAGNOSIS — C20 Malignant neoplasm of rectum: Secondary | ICD-10-CM

## 2015-04-16 MED ORDER — HEPARIN SOD (PORK) LOCK FLUSH 100 UNIT/ML IV SOLN
500.0000 [IU] | Freq: Once | INTRAVENOUS | Status: AC | PRN
  Administered 2015-04-16: 500 [IU]

## 2015-04-16 MED ORDER — HEPARIN SOD (PORK) LOCK FLUSH 100 UNIT/ML IV SOLN
INTRAVENOUS | Status: AC
  Filled 2015-04-16: qty 5

## 2015-04-16 MED ORDER — SODIUM CHLORIDE 0.9 % IJ SOLN
10.0000 mL | INTRAMUSCULAR | Status: DC | PRN
  Administered 2015-04-16: 10 mL
  Filled 2015-04-16: qty 10

## 2015-04-16 NOTE — Progress Notes (Signed)
..  Andrew Kline returns today for port de access and flush after 46 hr continous infusion of 61f. Tolerated infusion without problems. Portacath located lt chest wall was  deaccessed and flushed with 242mNS and 500U/3m32meparin and needle removed intact.  Procedure without incident. Patient tolerated procedure well.

## 2015-04-22 ENCOUNTER — Other Ambulatory Visit (HOSPITAL_COMMUNITY): Payer: Self-pay | Admitting: Emergency Medicine

## 2015-04-22 MED ORDER — PREGABALIN 150 MG PO CAPS: 150.0000 mg | ORAL_CAPSULE | Freq: Two times a day (BID) | ORAL | Status: DC

## 2015-04-25 ENCOUNTER — Encounter (HOSPITAL_COMMUNITY): Payer: Self-pay | Admitting: Hematology & Oncology

## 2015-04-27 NOTE — Progress Notes (Signed)
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) - Plan: CT Abdomen Pelvis W Contrast, CT Chest W Contrast, HYDROmorphone (DILAUDID) 4 MG tablet, CBC with Differential, Comprehensive metabolic panel, CEA, Urinalysis, dipstick only  Bronchitis - Plan: amoxicillin-clavulanate (AUGMENTIN) 875-125 MG tablet  CURRENT THERAPY: FOLFOX + Avastin  INTERVAL HISTORY: Andrew Kline 49 y.o. male returns for followup of Stage IV rectal cancer.    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    09/25/2014 Imaging CT abd/pelvis- Indeterminate nodules within the bilateral lung bases with dominant approximately 1.7 cm centrally necrotic nodule within the right lower lobe...   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   10/03/2014 Tumor Marker CEA NOT ELEVATED   10/09/2014 Imaging CT Chest- Bilateral pulmonary nodules of varying size are most consistent with pulmonary metastasis. There are approximately 6 lesions in each lung.   10/17/2014 Pathology Results Rectum, biopsy - INVASIVE ADENOCARCINOMA   10/29/2014 Pathology Results Diagnosis Lung, needle/core biopsy(ies), right lower lobe - METASTATIC ADENOCARCINOMA   11/04/2014 - 12/18/2014 Radiation Therapy Dr. Lisbeth Renshaw   11/05/2014 -  Chemotherapy FOLFOX + Avastin   02/11/2015 Surgery Colostomy formation by Dr. Arnoldo Morale.   02/26/2015 Imaging CT CAP- Interval response to therapy with decrease in size of multiple bilateral pulmonary nodules. No new or progressive pulmonary metastasis.  No evidence for metastatic disease within the abdomen or pelvis.    03/22/2015 Imaging CT RENAL STONE- There is an obstructing 4 mm stone within the distal left ureter resulting in moderate left  hydroureteronephrosis.  Re- demonstrated pulmonary nodules within the right lower lobe and lingula, compatible with known metastasis.    I personally reviewed and went over laboratory results with the patient.  The results are noted within this dictation.  [name] voices the following complaints: 1. Urinary pain. He notes that it has been persistent since the time of his surgery.  He has upcoming follow-up with urology for he past renal calculus.  He wishes to discuss this with his urologist.   "I mean, what the fuck?" he says. "What the fuck did they do to me in surgery when they cathetered [sic] me?" He notes that the discomfort is in "the head of my penis."  He denies any pain more proximal, "you mean in my shaft?"  He reports that urination increases the pain, along with erections.  He denies any difficulty with urination or stream.  He reports a strong stream.   2. He reports a cough, that increases at HS  It is productive of clear, brown sputums.  He denies any fevers.  Exam is impressive for expiratory wheezing and posterior pharynx erythema.  "Smoking actually makes it better."  He reports that he cough so hard that at night, he has to clinch his abdomen and thighs to prevent "pissing on myself."   3. He reports some tingling of his fingertips and toe tips only.  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  It is not interfering with ADLs or IADLs at this time.  He notes some intermittent fine motor manipulation issues.  He reports that it is worse as he gets nearer to chemotherapy dates.  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  He notes that he has picked his neighbor's house lock and re-keyed it as a joke.  "My neighbor was happy, but it was funny as hell."  He reports that the peripheral neuropathy, he fears, will start to interfere with his ability to manipulate locks and the intricacies associated with re-keying and breaking.  He reports, "I know I have bad  circulation to my feet and hands," he then goes off on a tangent regarding frostbite.  "I'll never get frostbite though."  He notes that he has subjected his peripheral extremities to severe temperature without any frostbite.  Therefore, he thinks he is invincible in that regard.  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  He knows to keep [place] informed regarding this side effect. 4. He notes mucous anal discharge with intermittent spotting of blood.  I am not concerned about this at this time. 5. He reports that he always sleeps on his right side.  He is concerned about developing "Cauliflower ear" on the right.  He does not have clinical manifestations of this issue.  He does have a right lesion at the crura of the helix and one in the triangular fossa.     Past Medical History  Diagnosis Date  . Rectal pain   . Arthritis   . Renal cell cancer (HCC)     2012.   [name] Rectal cancer (HCC)   . Shortness of breath dyspnea   . Anxiety   . Depression   . 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     has No Known Allergies.  Current Outpatient Prescriptions on File Prior to Visit  Medication Sig Dispense Refill  . amitriptyline (ELAVIL) 50 MG tablet Take 1 tablet (50 mg total) by mouth at bedtime. 30 tablet 2  . Bevacizumab (AVASTIN IV) Inject into the vein every 14 (fourteen) days. To start November 19, 2014    . citalopram (CELEXA) 40 MG tablet TAKE 1/2 TABLET BY MOUTH FOR 5 DAYS  THEN ONE TABLET DAILY THEREAFTER. (Patient taking differently: Take 40 mg by mouth daily. ) 30 tablet 1  . dextrose 5 % SOLN 1,000 mL with fluorouracil 5 GM/100ML SOLN Inject into the vein every 14 (fourteen) days. To start November 05, 2014. To infuse over 46 hours via pump.    [name] docusate sodium (COLACE) 100 MG capsule Take 1  capsule (100 mg total) by mouth every 12 (twelve) hours. (Patient taking differently: Take 100 mg by mouth at bedtime. ) 60 capsule 0  . DULoxetine (CYMBALTA) 30 MG capsule Take one capsule daily for 5 days then 2 daily thereafter (Patient taking differently: Take 30 mg by mouth daily. ) 60 capsule 3  . fentaNYL (DURAGESIC - DOSED MCG/HR) 75 MCG/HR Place 2 patches (150 mcg total) onto the skin every 3 (three) days. 10 patch 0  . leucovorin 50 MG injection Inject into the vein every 14 (fourteen) days. To start November 05, 2014    . lidocaine-hydrocortisone (ANAMANTLE) 3-1 % KIT Place 1 application rectally 2 (two) times daily. For two weeks, alternate with Rectiv. 28 each 1  . lidocaine-prilocaine (EMLA) cream Apply a quarter size amount to port site 1 hour prior to chemo. Do not rub in. Cover with plastic wrap. 30 g 3  . nitroGLYCERIN (NITROGLYN) 2 % ointment Apply 0.5  inches topically daily. Anal area    . ondansetron (ZOFRAN) 4 mg TABS tablet Take 4 tablets by mouth every 8 (eight) hours as needed. 4 tablet 0  . OXALIPLATIN IV Inject into the vein every 14 (fourteen) days. To start November 05, 2014    . pregabalin (LYRICA) 150 MG capsule Take 1 capsule (150 mg total) by mouth 2 (two) times daily. 60 capsule 0  . prochlorperazine (COMPAZINE) 10 MG tablet Take 1 tablet (10 mg total) by mouth every 6 (six) hours as needed for nausea or vomiting. 60 tablet 0  . ondansetron (ZOFRAN ODT) 4 MG disintegrating tablet '4mg'$  ODT q4 hours prn nausea/vomit (Patient not taking: Reported on 03/31/2015) 20 tablet 0  . potassium chloride SA (K-DUR,KLOR-CON) 20 MEQ tablet Take 1 tablet (20 mEq total) by mouth 2 (two) times daily. (Patient not taking: Reported on 03/31/2015) 30 tablet 0  . tamsulosin (FLOMAX) 0.4 MG CAPS capsule Take 1 capsule (0.4 mg total) by mouth daily. (Patient not taking: Reported on 04/14/2015) 30 capsule 0   Current Facility-Administered Medications on File Prior to Visit  Medication Dose Route  Frequency Provider Last Rate Last Dose  . 0.9 %  sodium chloride infusion   Intravenous Continuous Kelby Fam Penland, MD      . 0.9 %  sodium chloride infusion   Intravenous Continuous Patrici Ranks, MD   Stopped at 04/28/15 1213  . fluorouracil (ADRUCIL) 4,650 mg in sodium chloride 0.9 % 57 mL chemo infusion  2,400 mg/m2 (Treatment Plan Actual) Intravenous 1 day or 1 dose Patrici Ranks, MD      . leucovorin 776 mg in dextrose 5 % 250 mL infusion  400 mg/m2 (Treatment Plan Actual) Intravenous Once Patrici Ranks, MD 144 mL/hr at 04/28/15 1214 776 mg at 04/28/15 1214  . oxaliplatin (ELOXATIN) 165 mg in dextrose 5 % 500 mL chemo infusion  85 mg/m2 (Treatment Plan Actual) Intravenous Once Patrici Ranks, MD 267 mL/hr at 04/28/15 1215 165 mg at 04/28/15 1215  . sodium chloride 0.9 % injection 10 mL  10 mL Intracatheter PRN Patrici Ranks, MD        Past Surgical History  Procedure Laterality Date  . Hypospadias correction    . Laparoscopic partial nephrectomy  2012    right side  . Knee surgery Left     arthroscopy  . Flexible sigmoidoscopy N/A 09/30/2014    Procedure: FLEXIBLE SIGMOIDOSCOPY;  Surgeon: Danie Binder, MD;  Location: [facility];  Service: Endoscopy;  Laterality: N/A;  . Esophageal biopsy N/A 09/30/2014    Procedure: BIOPSY;  Surgeon: Danie Binder, MD;  Location: [facility];  Service: Endoscopy;  Laterality: N/A;  Anal Canal  . Flexible sigmoidoscopy N/A 10/17/2014    Procedure: FLEXIBLE SIGMOIDOSCOPY;  Surgeon: Danie Binder, MD;  Location: [facility];  Service: Endoscopy;  Laterality: N/A;  1325  . Lung biopsy Right   . Colostomy N/A 02/11/2015    Procedure: COLOSTOMY;  Surgeon: Aviva Signs, MD;  Location: [facility];  Service: General;  Laterality: N/A;    Denies any headaches, dizziness, double vision, fevers, chills, night sweats, nausea, vomiting, diarrhea, constipation, chest pain, heart palpitations, shortness of breath, blood in stool, black tarry stool,  urinary pain, urinary burning, urinary frequency, hematuria.   PHYSICAL EXAMINATION  ECOG PERFORMANCE STATUS: 1 - Symptomatic but completely ambulatory  Filed Vitals:   04/28/15 0911  BP: 147/70  Pulse: 83  Temp: 98.2 F (36.8 C)  Resp: 20  GENERAL:alert, no distress, well nourished, well developed, cooperative and smiling, in exam room, unaccompanied, playing a game on his cell phone when I entered the exam room. SKIN: skin color, texture, turgor are normal, no rashes or significant lesions HEAD: Normocephalic, No masses, lesions, tenderness or abnormalities EYES: normal, PERRLA, EOMI, Conjunctiva are pink and non-injected EARS: Right external ear lesion, 0.5 cm that is pustular in nature in the crura of the helix and and a smaller pustular lesion in the triangular fossa of the right ear.   OROPHARYNX:lips, buccal mucosa, and tongue normal and mucous membranes are moist , poor dentition, posterior pharynx erythema NECK: supple, trachea midline LYMPH:  no palpable lymphadenopathy BREAST:not examined LUNGS: clear to auscultation with some expiratory wheezing bilaterally. HEART: regular rate & rhythm ABDOMEN:abdomen soft, normal bowel sounds and colostomy producing stool appropriately. BACK: Back symmetric, no curvature., No CVA tenderness EXTREMITIES:less then 2 second capillary refill, no joint deformities, effusion, or inflammation, no skin discoloration, no cyanosis  NEURO: alert & oriented x 3 with fluent speech, no focal motor/sensory deficits, ambulating unassisted today.  LABORATORY DATA: CBC    Component Value Date/Time   WBC 10.8* 04/28/2015 0925   RBC 4.09* 04/28/2015 0925   HGB 13.3 04/28/2015 0925   HCT 40.1 04/28/2015 0925   PLT 240 04/28/2015 0925   MCV 98.0 04/28/2015 0925   MCH 32.5 04/28/2015 0925   MCHC 33.2 04/28/2015 0925   RDW 16.6* 04/28/2015 0925   LYMPHSABS 0.9 04/28/2015 0925   MONOABS 1.1* 04/28/2015 0925   EOSABS 0.2 04/28/2015 0925    BASOSABS 0.0 04/28/2015 0925      Chemistry      Component Value Date/Time   NA 135 04/28/2015 0925   K 3.6 04/28/2015 0925   CL 102 04/28/2015 0925   CO2 25 04/28/2015 0925   BUN 6 04/28/2015 0925   CREATININE 0.73 04/28/2015 0925      Component Value Date/Time   CALCIUM 8.7* 04/28/2015 0925   ALKPHOS 88 04/28/2015 0925   AST 16 04/28/2015 0925   ALT 12* 04/28/2015 0925   BILITOT 0.2* 04/28/2015 0925     Lab Results  Component Value Date   CEA 3.3 03/31/2015     PENDING LABS:   RADIOGRAPHIC STUDIES:  Dg Abd 1 View  04/07/2015  CLINICAL DATA:  Rectal cancer.  Pain. EXAM: ABDOMEN - 1 VIEW COMPARISON:  CT 03/22/2015. FINDINGS: Soft tissue structures are unremarkable. Pelvic calcifications again noted consistent phleboliths. Punctate calcification in the left lower pelvis could represent previously identified tiny distal left ureteral stone. Pancreatic calcifications noted. No prominent bowel distention. Stool noted throughout the colon. Ostomy left lower quadrant. No acute bony abnormality. IMPRESSION: 1. Punctate calcifications in the pelvis consistent with phleboliths. Tiny punctate stone in the left lower pelvis could represent the previously identified distal left ureteral stone. 2. Pancreatic calcifications consistent chronic pancreatitis. 3. Large stool volume. 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    Oncology history is up to date.  His pain regimen consists of Dilaudid, Fentanyl and Lyrica.  Lyrica has been very helpful in his pain management.  Fentanyl patches are causing sites of contact dermatitis for which he is using OTC hydrocortisone cream.  He requests a refill on his Dilaudid and this is completed today.  I have recommended neosporin  to his right ear lesions.  Rx for Augmentin  for cough and posterior pharynx erythema.  He may use OTC Delsym at HS to help with his cough.  He is going to follow-up as directed with his urologist.  He admits he is no longer taking FloMax as his coughing has led to episodes of incontinence.  I will defer his urianry pain to his urologist if his urine dipstick is negative for any suspicious findings of UTI.  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  It is currently grade I.  Therefore, we will continue as planned.  He will keep [place] abreast of any changes or progression of this symptom.  He is currently on Lyrica for other discomfort.  He will be due for restgaing and therefore CT CAP is ordered for mid Jan 2017.    Restaging scans are ordered for mid-end Jan 2017.  His labs meet treatment parameters today.  Return in 2 weeks for follow-up and treatment.  This will be followed by restaging scans.    THERAPY PLAN:  Continue treatment as planned monitoring for toxicities and intolerances.  Additionally, we will continue to monitor for progressive disease that would require a change in therapy to second line option(s). We will restage him in about 3 weeks with repeat CT imaging.  All questions were answered. The patient knows to call the clinic with any problems, questions or concerns. We can certainly see the patient much sooner if necessary.  Patient and plan discussed with Dr. Ancil Linsey and she is in agreement with the aforementioned.   This note is electronically signed by: Doy Mince 04/28/2015 1:11 PM

## 2015-04-27 NOTE — Assessment & Plan Note (Addendum)
Stage IV rectal cancer, on systemic chemotherapy with FOLFOX + Avastin.    Oncology history is up to date.  His pain regimen consists of Dilaudid, Fentanyl and Lyrica.  Lyrica has been very helpful in his pain management.  Fentanyl patches are causing sites of contact dermatitis for which he is using OTC hydrocortisone cream.  He requests a refill on his Dilaudid and this is completed today.  I have recommended neosporin to his right ear lesions.  Rx for Augmentin for cough and posterior pharynx erythema.  He may use OTC Delsym at HS to help with his cough.  He is going to follow-up as directed with his urologist.  He admits he is no longer taking FloMax as his coughing has led to episodes of incontinence.  I will defer his urianry pain to his urologist if his urine dipstick is negative for any suspicious findings of UTI.  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  It is currently grade I.  Therefore, we will continue as planned.  He will keep [place] abreast of any changes or progression of this symptom.  He is currently on Lyrica for other discomfort.  He will be due for restgaing and therefore CT CAP is ordered for mid Jan 2017.    Restaging scans are ordered for mid-end Jan 2017.  His labs meet treatment parameters today.  Return in 2 weeks for follow-up and treatment.  This will be followed by restaging scans.

## 2015-04-28 ENCOUNTER — Encounter (HOSPITAL_BASED_OUTPATIENT_CLINIC_OR_DEPARTMENT_OTHER): Payer: Medicaid Other

## 2015-04-28 ENCOUNTER — Encounter (HOSPITAL_COMMUNITY): Payer: Self-pay | Admitting: Oncology

## 2015-04-28 ENCOUNTER — Encounter (HOSPITAL_BASED_OUTPATIENT_CLINIC_OR_DEPARTMENT_OTHER): Payer: Medicaid Other | Admitting: Oncology

## 2015-04-28 VITALS — BP 133/74 | HR 74 | Temp 98.1°F | Resp 15

## 2015-04-28 VITALS — BP 147/70 | HR 83 | Temp 98.2°F | Resp 20 | Wt 176.4 lb

## 2015-04-28 DIAGNOSIS — Z5112 Encounter for antineoplastic immunotherapy: Secondary | ICD-10-CM | POA: Diagnosis not present

## 2015-04-28 DIAGNOSIS — C20 Malignant neoplasm of rectum: Secondary | ICD-10-CM | POA: Diagnosis not present

## 2015-04-28 DIAGNOSIS — C7800 Secondary malignant neoplasm of unspecified lung: Secondary | ICD-10-CM | POA: Diagnosis not present

## 2015-04-28 DIAGNOSIS — J40 Bronchitis, not specified as acute or chronic: Secondary | ICD-10-CM | POA: Diagnosis not present

## 2015-04-28 DIAGNOSIS — R918 Other nonspecific abnormal finding of lung field: Secondary | ICD-10-CM | POA: Diagnosis not present

## 2015-04-28 DIAGNOSIS — Z5111 Encounter for antineoplastic chemotherapy: Secondary | ICD-10-CM | POA: Diagnosis present

## 2015-04-28 LAB — CBC WITH DIFFERENTIAL/PLATELET
BASOS PCT: 0 %
Basophils Absolute: 0 10*3/uL (ref 0.0–0.1)
EOS ABS: 0.2 10*3/uL (ref 0.0–0.7)
Eosinophils Relative: 2 %
HCT: 40.1 % (ref 39.0–52.0)
HEMOGLOBIN: 13.3 g/dL (ref 13.0–17.0)
Lymphocytes Relative: 8 %
Lymphs Abs: 0.9 10*3/uL (ref 0.7–4.0)
MCH: 32.5 pg (ref 26.0–34.0)
MCHC: 33.2 g/dL (ref 30.0–36.0)
MCV: 98 fL (ref 78.0–100.0)
Monocytes Absolute: 1.1 10*3/uL — ABNORMAL HIGH (ref 0.1–1.0)
Monocytes Relative: 11 %
NEUTROS PCT: 79 %
Neutro Abs: 8.6 10*3/uL — ABNORMAL HIGH (ref 1.7–7.7)
Platelets: 240 10*3/uL (ref 150–400)
RBC: 4.09 MIL/uL — AB (ref 4.22–5.81)
RDW: 16.6 % — ABNORMAL HIGH (ref 11.5–15.5)
WBC: 10.8 10*3/uL — AB (ref 4.0–10.5)

## 2015-04-28 LAB — COMPREHENSIVE METABOLIC PANEL
ALBUMIN: 3.5 g/dL (ref 3.5–5.0)
ALK PHOS: 88 U/L (ref 38–126)
ALT: 12 U/L — AB (ref 17–63)
AST: 16 U/L (ref 15–41)
Anion gap: 8 (ref 5–15)
BUN: 6 mg/dL (ref 6–20)
CALCIUM: 8.7 mg/dL — AB (ref 8.9–10.3)
CHLORIDE: 102 mmol/L (ref 101–111)
CO2: 25 mmol/L (ref 22–32)
CREATININE: 0.73 mg/dL (ref 0.61–1.24)
GFR calc non Af Amer: >60 mL/min (ref >60)
Glucose, Bld: 121 mg/dL — ABNORMAL HIGH (ref 65–99)
Potassium: 3.6 mmol/L (ref 3.5–5.1)
SODIUM: 135 mmol/L (ref 135–145)
TOTAL PROTEIN: 6.7 g/dL (ref 6.5–8.1)
Total Bilirubin: 0.2 mg/dL — ABNORMAL LOW (ref 0.3–1.2)

## 2015-04-28 LAB — URINALYSIS, DIPSTICK ONLY
BILIRUBIN URINE: NEGATIVE
GLUCOSE, UA: NEGATIVE mg/dL
HGB URINE DIPSTICK: NEGATIVE
Leukocytes, UA: NEGATIVE
Nitrite: NEGATIVE
PH: 5.5 (ref 5.0–8.0)
PROTEIN: NEGATIVE mg/dL
Specific Gravity, Urine: 1.025 (ref 1.005–1.030)

## 2015-04-28 MED ORDER — DEXTROSE 5 % IV SOLN
Freq: Once | INTRAVENOUS | Status: AC
  Administered 2015-04-28: 12:00:00 via INTRAVENOUS

## 2015-04-28 MED ORDER — SODIUM CHLORIDE 0.9 % IJ SOLN: 10.0000 mL | INTRAMUSCULAR | Status: DC | PRN

## 2015-04-28 MED ORDER — HYDROMORPHONE HCL 4 MG PO TABS: 4.0000 mg | ORAL_TABLET | ORAL | Status: DC | PRN

## 2015-04-28 MED ORDER — LEUCOVORIN CALCIUM INJECTION 350 MG
400.0000 mg/m2 | Freq: Once | INTRAVENOUS | Status: AC
  Administered 2015-04-28: 776 mg via INTRAVENOUS
  Filled 2015-04-28: qty 38.8

## 2015-04-28 MED ORDER — AMOXICILLIN-POT CLAVULANATE 875-125 MG PO TABS: 1.0000 | ORAL_TABLET | Freq: Two times a day (BID) | ORAL | Status: DC

## 2015-04-28 MED ORDER — SODIUM CHLORIDE 0.9 % IV SOLN
5.0000 mg/kg | Freq: Once | INTRAVENOUS | Status: AC
  Administered 2015-04-28: 400 mg via INTRAVENOUS
  Filled 2015-04-28: qty 16

## 2015-04-28 MED ORDER — SODIUM CHLORIDE 0.9 % IV SOLN
INTRAVENOUS | Status: DC
  Administered 2015-04-28: 11:00:00 via INTRAVENOUS

## 2015-04-28 MED ORDER — SODIUM CHLORIDE 0.9 % IV SOLN
2400.0000 mg/m2 | INTRAVENOUS | Status: DC
  Administered 2015-04-28: 4650 mg via INTRAVENOUS
  Filled 2015-04-28: qty 93

## 2015-04-28 MED ORDER — DEXTROSE 5 % IV SOLN
85.0000 mg/m2 | Freq: Once | INTRAVENOUS | Status: AC
  Administered 2015-04-28: 165 mg via INTRAVENOUS
  Filled 2015-04-28: qty 33

## 2015-04-28 MED ORDER — SODIUM CHLORIDE 0.9 % IV SOLN
Freq: Once | INTRAVENOUS | Status: AC
  Administered 2015-04-28: 11:00:00 via INTRAVENOUS
  Filled 2015-04-28: qty 4

## 2015-04-28 NOTE — Patient Instructions (Signed)
[place] at [facility] Discharge Instructions  RECOMMENDATIONS MADE BY THE CONSULTANT AND ANY TEST RESULTS WILL BE SENT TO YOUR REFERRING PHYSICIAN.  Exam and discussion by Robynn Pane, PA-C Use neosporin on your right ear If your neuropathy worsens let us nont Augmentin for cough - take as directed Can use Delsym for cough Call with concerns  Follow-up: Chemo and office visit in 2 abd 4 weeks. CT of Chest Abdomen and Pelvis in 3 weeks   Thank you for choosing [place] at [facility] to provide your oncology and hematology care.  To afford each patient quality time with our provider, please arrive at least 15 minutes before your scheduled appointment time.    You need to re-schedule your appointment should you arrive 10 or more minutes late.  We strive to give you quality time with our providers, and arriving late affects you and other patients whose appointments are after yours.  Also, if you no show three or more times for appointments you may be dismissed from the clinic at the providers discretion.     Again, thank you for choosing [facility].  Our hope is that these requests will decrease the amount of time that you wait before being seen by our physicians.       _____________________________________________________________  Should you have questions after your visit to [facility], please contact our office at [phone] between the hours of 8:30 a.m. and 4:30 p.m.  Voicemails left after 4:30 p.m. will not be returned until the following business day.  For prescription refill requests, have your pharmacy contact our office.

## 2015-04-28 NOTE — Patient Instructions (Signed)
[facility] Discharge Instructions for Patients Receiving Chemotherapy  Today you received the following chemotherapy agents: Oxaliplatin, Leucovorin, and Avastin.     If you develop nausea and vomiting, or diarrhea that is not controlled by your medication, call the clinic.  The clinic phone number is [phone]. Office hours are Monday-Friday 8:30am-5:00pm.  BELOW ARE SYMPTOMS THAT SHOULD BE REPORTED IMMEDIATELY:  *FEVER GREATER THAN 101.0 F  *CHILLS WITH OR WITHOUT FEVER  NAUSEA AND VOMITING THAT IS NOT CONTROLLED WITH YOUR NAUSEA MEDICATION  *UNUSUAL SHORTNESS OF BREATH  *UNUSUAL BRUISING OR BLEEDING  TENDERNESS IN MOUTH AND THROAT WITH OR WITHOUT PRESENCE OF ULCERS  *URINARY PROBLEMS  *BOWEL PROBLEMS  UNUSUAL RASH Items with * indicate a potential emergency and should be followed up as soon as possible. If you have an emergency after office hours please contact your primary care physician or go to the nearest emergency department.  Please call the clinic during office hours if you have any questions or concerns.   You may also contact the Patient Navigator at [phone] should you have any questions or need assistance in obtaining follow up care.

## 2015-04-28 NOTE — Progress Notes (Signed)
Patient tolerated infusion well.  VSS.   

## 2015-04-29 LAB — CEA: CEA: 3.6 ng/mL (ref 0.0–4.7)

## 2015-04-30 ENCOUNTER — Encounter (HOSPITAL_BASED_OUTPATIENT_CLINIC_OR_DEPARTMENT_OTHER): Payer: Medicaid Other

## 2015-04-30 ENCOUNTER — Encounter (HOSPITAL_COMMUNITY): Payer: Self-pay

## 2015-04-30 VITALS — BP 163/84 | HR 80 | Temp 98.0°F | Resp 18

## 2015-04-30 DIAGNOSIS — Z452 Encounter for adjustment and management of vascular access device: Secondary | ICD-10-CM

## 2015-04-30 DIAGNOSIS — C20 Malignant neoplasm of rectum: Secondary | ICD-10-CM | POA: Diagnosis not present

## 2015-04-30 DIAGNOSIS — C7800 Secondary malignant neoplasm of unspecified lung: Secondary | ICD-10-CM

## 2015-04-30 MED ORDER — HEPARIN SOD (PORK) LOCK FLUSH 100 UNIT/ML IV SOLN: 500.0000 [IU] | Freq: Once | INTRAVENOUS | Status: AC | PRN

## 2015-04-30 MED ORDER — SODIUM CHLORIDE 0.9 % IJ SOLN
10.0000 mL | INTRAMUSCULAR | Status: DC | PRN
  Administered 2015-04-30: 10 mL
  Filled 2015-04-30: qty 10

## 2015-04-30 MED ORDER — HEPARIN SOD (PORK) LOCK FLUSH 100 UNIT/ML IV SOLN
500.0000 [IU] | Freq: Once | INTRAVENOUS | Status: AC
  Administered 2015-04-30: 500 [IU] via INTRAVENOUS

## 2015-04-30 NOTE — Progress Notes (Signed)
..  Andrew Kline returns today for port de access and flush after 46 hr continous infusion of 28f. Tolerated infusion without problems. Portacath located rt chest wall was  deaccessed and flushed with 274mNS and 500U/77m14meparin and needle removed intact.  Procedure without incident. Patient tolerated procedure well.

## 2015-04-30 NOTE — Patient Instructions (Signed)
..  [facility] Discharge Instructions for Patients Receiving Chemotherapy  Today you received the following chemotherapy agents 69f continous pump d/ced today  To help prevent nausea and vomiting after your treatment, we encourage you to take your nausea medication  Begin taking it when needed  and take it as often as prescribed  If you develop nausea and vomiting, or diarrhea that is not controlled by your medication, call the clinic.  The clinic phone number is [phone] Office hours are Monday-Friday 8:30am-5:00pm.  BELOW ARE SYMPTOMS THAT SHOULD BE REPORTED IMMEDIATELY:  *FEVER GREATER THAN 101.0 F  *CHILLS WITH OR WITHOUT FEVER  NAUSEA AND VOMITING THAT IS NOT CONTROLLED WITH YOUR NAUSEA MEDICATION  *UNUSUAL SHORTNESS OF BREATH  *UNUSUAL BRUISING OR BLEEDING  TENDERNESS IN MOUTH AND THROAT WITH OR WITHOUT PRESENCE OF ULCERS  *URINARY PROBLEMS  *BOWEL PROBLEMS  UNUSUAL RASH Items with * indicate a potential emergency and should be followed up as soon as possible. If you have an emergency after office hours please contact your primary care physician or go to the nearest emergency department.  Please call the clinic during office hours if you have any questions or concerns.   You may also contact the Patient Navigator at [phone] you have any questions or need assistance in obtaining follow up care.

## 2015-05-10 NOTE — Assessment & Plan Note (Addendum)
Stage IV rectal cancer, on systemic chemotherapy with FOLFOX + Avastin.    Oncology history is up to date.  His pain regimen consists of Dilaudid, Fentanyl and Lyrica.  Lyrica has been very helpful in his pain management.  He requests a refill on his Dilaudid, Fentanyl, and Lyrica and this is completed today.  He is currently on Fentanyl 150 mcg/hr, Dilaudid 4 mg PRN, and Lyrica 150 mg daily.  I will not change any pain medication at this time.  He is on 275 mg of morphine/24 hours.  He completed his course of Augmentin on Sunday, 05/10/2015.  He persists with a cough that is "continuous" without any fevers.  He is welcome to take OTC cough suppressants, ie Delsym.  He is observed today prior to his appointment.  I started observing him at 62 AM this AM.  He is seen laying flat in bed, texting periodically on his cell phone.  At [place] AM CSW visited the patient.  No physical signs of pain noted.  Abby Potash visited with him for 5 minutes and then I entered the room at 1045 AM.    His only complaint today was pain.  No wincing in pain, hyperventilation, etc which is commonly witnessed during exams.  Today, he is calm and collected.  He is seen laying on his right side/buttocks as this helps with his pain (please noted, the 15 minutes prior to my exam, he was laying flat on the bed).  He reports that his pain level is significant.  "I usually can tolerate pain well.  The pain is "unmerciful."    He reports increased rectal discharge, "more than normal."  He hopes this is a sign of response to therapy.  He will be due for restgaing and therefore CT CAP is ordered for mid-Jan 2017.    His labs meet treatment parameters today.  Return in 2 weeks for follow-up, review of restaging scan results, and treatment.

## 2015-05-10 NOTE — Progress Notes (Signed)
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 male returns for followup of Stage IV rectal cancer.    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    09/25/2014 Imaging CT abd/pelvis- Indeterminate nodules within the bilateral lung bases with dominant approximately 1.7 cm centrally necrotic nodule within the right lower lobe...   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   10/03/2014 Tumor Marker CEA NOT ELEVATED   10/09/2014 Imaging CT Chest- Bilateral pulmonary nodules of varying size are most consistent with pulmonary metastasis. There are approximately 6 lesions in each lung.   10/17/2014 Pathology Results Rectum, biopsy - INVASIVE ADENOCARCINOMA   10/29/2014 Pathology Results Diagnosis Lung, needle/core biopsy(ies), right lower lobe - METASTATIC ADENOCARCINOMA   11/04/2014 - 12/18/2014 Radiation Therapy Dr. Lisbeth Renshaw   11/05/2014 -  Chemotherapy FOLFOX + Avastin   02/11/2015 Surgery Colostomy formation by Dr. Arnoldo Morale.   02/26/2015 Imaging CT CAP- Interval response to therapy with decrease in size of multiple bilateral pulmonary nodules. No new or progressive pulmonary metastasis.  No evidence for metastatic disease within the abdomen or pelvis.    03/22/2015 Imaging CT RENAL STONE- There is an obstructing 4 mm stone within the distal left ureter resulting in moderate left  hydroureteronephrosis.  Re- demonstrated pulmonary nodules within the right lower lobe and lingula, compatible with known metastasis.    I personally reviewed and went over laboratory results with the patient.  The results are noted within this dictation.  He meets treatment parameters today.  Today I observed the patient prior to his visit today.  Nursing had taken him to the chemotherapy area where he was fortunate enough today to get a bed for chemotherapy.  At 1030 AM, I observed the patient from the nursing chemotherapy area.  He was seen texting occasionally.  He was noted to be laying in bed on his back.  No indications of severe pain for postural or gesture features indicative of pain.  CSW visited the patient at 1040 AM for 5 minutes.  Grier left the room at 1045 AM.  This was observed as well and no indication for acute pain.  I went into the treatment room with the patient on 1045 AM after Grier left.  He was seen laying on his right buttocks to avoid pressure on his rectal area.  He reports this helps with pain.    He reports his pain is "unmerciful."  He notes that he cannot sit down.  He favors laying on his side to avoid rectal pressure.  He notes increased pain.  He is currently on 150 mcg/hr of Fentanyl and Dilaudid.  I have calculated his morphine equivalent to be 275 mg/24 hours.    He is hopeful that his increased pain is from response to therapy, which does not make sense from an oncology perspective.  Past Medical History  Diagnosis Date  . Rectal pain   . Arthritis   .  Renal cell cancer ([place])     2012.   [name] Rectal cancer ([place])   . Shortness of breath dyspnea   . Anxiety   . Depression   . 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); Rectal carcinoma ([place]); and Malnutrition of moderate degree ([place]) on his problem list.     has No Known Allergies.  Current Outpatient Prescriptions  on File Prior to Visit  Medication Sig Dispense Refill  . amitriptyline (ELAVIL) 50 MG tablet Take 1 tablet (50 mg total) by mouth at bedtime. 30 tablet 2  . Bevacizumab (AVASTIN IV) Inject into the vein every 14 (fourteen) days. To start November 19, 2014    . citalopram (CELEXA) 40 MG tablet TAKE 1/2 TABLET BY MOUTH FOR 5 DAYS  THEN ONE TABLET DAILY THEREAFTER. (Patient taking differently: Take 40 mg by mouth daily. ) 30 tablet 1  . dextrose 5 % SOLN 1,000 mL with fluorouracil 5 GM/100ML SOLN Inject into the vein every 14 (fourteen) days. To start November 05, 2014. To infuse over 46 hours via pump.    [name] docusate sodium (COLACE) 100 MG capsule Take 1 capsule (100 mg total) by mouth every 12 (twelve) hours. (Patient taking differently: Take 100 mg by mouth at bedtime. ) 60 capsule 0  . DULoxetine (CYMBALTA) 30 MG capsule Take one capsule daily for 5 days then 2 daily thereafter (Patient taking differently: Take 30 mg by mouth daily. ) 60 capsule 3  . leucovorin 50 MG injection Inject into the vein every 14 (fourteen) days. To start November 05, 2014    . lidocaine-hydrocortisone (ANAMANTLE) 3-1 % KIT Place 1 application rectally 2 (two) times daily. For two weeks, alternate with Rectiv. 28 each 1  . lidocaine-prilocaine (EMLA) cream Apply a quarter size amount to port site 1 hour prior to chemo. Do not rub in. Cover with plastic wrap. 30 g 3  . nitroGLYCERIN (NITROGLYN) 2 % ointment Apply 0.5 inches topically daily. Anal area    . ondansetron (ZOFRAN ODT) 4 MG disintegrating tablet 46m ODT q4 hours prn nausea/vomit (Patient not taking: Reported on 03/31/2015) 20 tablet 0  . ondansetron (ZOFRAN) 4 mg TABS tablet Take 4 tablets by mouth every 8 (eight) hours as needed. 4 tablet 0  . OXALIPLATIN IV Inject into the vein every 14 (fourteen) days. To start November 05, 2014    . potassium chloride SA (K-DUR,KLOR-CON) 20 MEQ tablet Take 1 tablet (20 mEq total) by mouth 2 (two) times daily. (Patient not taking: Reported on  03/31/2015) 30 tablet 0  . prochlorperazine (COMPAZINE) 10 MG tablet Take 1 tablet (10 mg total) by mouth every 6 (six) hours as needed for nausea or vomiting. 60 tablet 0  . tamsulosin (FLOMAX) 0.4 MG CAPS capsule Take 1 capsule (0.4 mg total) by mouth daily. (Patient not taking: Reported on 04/14/2015) 30 capsule 0   Current Facility-Administered Medications on File Prior to Visit  Medication Dose Route Frequency Provider Last Rate Last Dose  . 0.9 %  sodium chloride infusion   Intravenous Continuous SPatrici Ranks MD      . fluorouracil (ADRUCIL) 4,650 mg in sodium chloride 0.9 % 57 mL chemo infusion  2,400 mg/m2 (Treatment Plan Actual) Intravenous 1 day or 1 dose SPatrici Ranks MD   4,650 mg at 05/12/15 1437  . sodium chloride 0.9 % injection 10 mL  10 mL Intracatheter  PRN Patrici Ranks, MD        Past Surgical History  Procedure Laterality Date  . Hypospadias correction    . Laparoscopic partial nephrectomy  2012    right side  . Knee surgery Left     arthroscopy  . Flexible sigmoidoscopy N/A 09/30/2014    Procedure: FLEXIBLE SIGMOIDOSCOPY;  Surgeon: Danie Binder, MD;  Location: [facility];  Service: Endoscopy;  Laterality: N/A;  . Esophageal biopsy N/A 09/30/2014    Procedure: BIOPSY;  Surgeon: Danie Binder, MD;  Location: [facility];  Service: Endoscopy;  Laterality: N/A;  Anal Canal  . Flexible sigmoidoscopy N/A 10/17/2014    Procedure: FLEXIBLE SIGMOIDOSCOPY;  Surgeon: Danie Binder, MD;  Location: [facility];  Service: Endoscopy;  Laterality: N/A;  1325  . Lung biopsy Right   . Colostomy N/A 02/11/2015    Procedure: COLOSTOMY;  Surgeon: Aviva Signs, MD;  Location: [facility];  Service: General;  Laterality: N/A;    Denies any headaches, dizziness, double vision, fevers, chills, night sweats, nausea, vomiting, diarrhea, constipation, chest pain, heart palpitations, shortness of breath, blood in stool, black tarry stool, urinary pain, urinary burning, urinary frequency,  hematuria.   PHYSICAL EXAMINATION  ECOG PERFORMANCE STATUS: 1 - Symptomatic but completely ambulatory  There were no vitals filed for this visit.  GENERAL:alert, no distress, well nourished, well developed, cooperative and smiling, unaccompanied. SKIN: skin color, texture, turgor are normal, no rashes or significant lesions HEAD: Normocephalic, No masses, lesions, tenderness or abnormalities EYES: normal, PERRLA, EOMI, Conjunctiva are pink and non-injected EARS: Right external ear lesion is healed.   OROPHARYNX:lips, buccal mucosa, and tongue normal and mucous membranes are moist , poor dentition NECK: supple, trachea midline LYMPH:  no palpable lymphadenopathy BREAST:not examined LUNGS: clear to auscultation with bibasilar rhonchi HEART: regular rate & rhythm, without murmur, rub, gallop. ABDOMEN:abdomen soft, normal bowel sounds and colostomy producing soft stool appropriately. BACK: Back symmetric, no curvature., No CVA tenderness EXTREMITIES:less then 2 second capillary refill, no joint deformities, effusion, or inflammation, no skin discoloration, no cyanosis  NEURO: alert & oriented x 3 with fluent speech, no focal motor/sensory deficits, ambulating unassisted today.  LABORATORY DATA: CBC    Component Value Date/Time   WBC 6.6 05/12/2015 0950   RBC 4.15* 05/12/2015 0950   HGB 13.4 05/12/2015 0950   HCT 41.0 05/12/2015 0950   PLT 234 05/12/2015 0950   MCV 98.8 05/12/2015 0950   MCH 32.3 05/12/2015 0950   MCHC 32.7 05/12/2015 0950   RDW 16.6* 05/12/2015 0950   LYMPHSABS 0.8 05/12/2015 0950   MONOABS 0.8 05/12/2015 0950   EOSABS 0.2 05/12/2015 0950   BASOSABS 0.0 05/12/2015 0950      Chemistry      Component Value Date/Time   NA 140 05/12/2015 0950   K 3.8 05/12/2015 0950   CL 105 05/12/2015 0950   CO2 27 05/12/2015 0950   BUN 5* 05/12/2015 0950   CREATININE 0.73 05/12/2015 0950      Component Value Date/Time   CALCIUM 9.1 05/12/2015 0950   ALKPHOS 117  05/12/2015 0950   AST 19 05/12/2015 0950   ALT 13* 05/12/2015 0950   BILITOT 0.4 05/12/2015 0950     Lab Results  Component Value Date   CEA 3.6 04/28/2015     PENDING LABS:   RADIOGRAPHIC STUDIES:  No results found.   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    Oncology history is up to date.  His pain regimen consists of Dilaudid, Fentanyl and Lyrica.  Lyrica has been very helpful in his pain management.  He requests a refill on his Dilaudid, Fentanyl, and Lyrica and this is completed today.  He is currently on Fentanyl 150 mcg/hr, Dilaudid 4 mg PRN, and Lyrica 150 mg daily.  I will not change any pain medication at this time.  He is on 275 mg of morphine/24 hours.  He completed his course of Augmentin on Sunday, 05/10/2015.  He persists with a cough that is "continuous" without any fevers.  He is welcome to take OTC cough suppressants, ie Delsym.  He is observed today prior to his appointment.  I started observing him at 59 AM this AM.  He is seen laying flat in bed, texting periodically on his cell phone.  At [place] AM CSW visited the patient.  No physical signs of pain noted.  Abby Potash visited with him for 5 minutes and then I entered the room at 1045 AM.    His only complaint today was pain.  No wincing in pain, hyperventilation, etc which is commonly witnessed during exams.  Today, he is calm and collected.  He is seen laying on his right side/buttocks as this helps with his pain (please noted, the 15 minutes prior to my exam, he was laying flat on the bed).  He reports that his pain level is significant.  "I usually can tolerate pain well.  The pain is "unmerciful."    He reports increased rectal discharge, "more than normal."  He hopes this is a sign of response to therapy.  He will be due for restgaing and therefore CT CAP is ordered for mid-Jan 2017.    His labs meet  treatment parameters today.  Return in 2 weeks for follow-up, review of restaging scan results, and treatment.  THERAPY PLAN:  Continue treatment as planned monitoring for toxicities and intolerances.  Additionally, we will continue to monitor for progressive disease that would require a change in therapy to second line option(s). We will restage him in about 1-2 weeks with repeat CT imaging to help guide future treatment options.  All questions were answered. The patient knows to call the clinic with any problems, questions or concerns. We can certainly see the patient much sooner if necessary.  Patient and plan discussed with Dr. Ancil Linsey and she is in agreement with the aforementioned.   This note is electronically signed by: Doy Mince 05/12/2015 4:52 PM

## 2015-05-12 ENCOUNTER — Encounter (HOSPITAL_COMMUNITY): Payer: Medicaid Other | Attending: Hematology & Oncology

## 2015-05-12 ENCOUNTER — Encounter (HOSPITAL_BASED_OUTPATIENT_CLINIC_OR_DEPARTMENT_OTHER): Payer: Medicaid Other | Admitting: Oncology

## 2015-05-12 ENCOUNTER — Ambulatory Visit: Payer: Medicaid Other | Admitting: Urology

## 2015-05-12 ENCOUNTER — Encounter: Payer: Self-pay | Admitting: *Deleted

## 2015-05-12 VITALS — BP 140/83 | HR 75 | Temp 98.3°F | Resp 18 | Wt 177.0 lb

## 2015-05-12 DIAGNOSIS — R05 Cough: Secondary | ICD-10-CM | POA: Diagnosis not present

## 2015-05-12 DIAGNOSIS — C20 Malignant neoplasm of rectum: Secondary | ICD-10-CM | POA: Diagnosis present

## 2015-05-12 DIAGNOSIS — Z5112 Encounter for antineoplastic immunotherapy: Secondary | ICD-10-CM | POA: Diagnosis not present

## 2015-05-12 DIAGNOSIS — Z5111 Encounter for antineoplastic chemotherapy: Secondary | ICD-10-CM

## 2015-05-12 DIAGNOSIS — K6289 Other specified diseases of anus and rectum: Secondary | ICD-10-CM

## 2015-05-12 DIAGNOSIS — R918 Other nonspecific abnormal finding of lung field: Secondary | ICD-10-CM | POA: Insufficient documentation

## 2015-05-12 DIAGNOSIS — R52 Pain, unspecified: Secondary | ICD-10-CM

## 2015-05-12 DIAGNOSIS — C7800 Secondary malignant neoplasm of unspecified lung: Secondary | ICD-10-CM

## 2015-05-12 LAB — URINALYSIS, DIPSTICK ONLY
Bilirubin Urine: NEGATIVE
GLUCOSE, UA: NEGATIVE mg/dL
HGB URINE DIPSTICK: NEGATIVE
KETONES UR: NEGATIVE mg/dL
LEUKOCYTES UA: NEGATIVE
Nitrite: NEGATIVE
PH: 6 (ref 5.0–8.0)
PROTEIN: NEGATIVE mg/dL
Specific Gravity, Urine: <1.005 — ABNORMAL LOW (ref 1.005–1.030)

## 2015-05-12 LAB — CBC WITH DIFFERENTIAL/PLATELET
BASOS PCT: 0 %
Basophils Absolute: 0 10*3/uL (ref 0.0–0.1)
EOS ABS: 0.2 10*3/uL (ref 0.0–0.7)
EOS PCT: 3 %
HCT: 41 % (ref 39.0–52.0)
HEMOGLOBIN: 13.4 g/dL (ref 13.0–17.0)
LYMPHS ABS: 0.8 10*3/uL (ref 0.7–4.0)
LYMPHS PCT: 12 %
MCH: 32.3 pg (ref 26.0–34.0)
MCHC: 32.7 g/dL (ref 30.0–36.0)
MCV: 98.8 fL (ref 78.0–100.0)
Monocytes Absolute: 0.8 10*3/uL (ref 0.1–1.0)
Monocytes Relative: 12 %
NEUTROS PCT: 73 %
Neutro Abs: 4.8 10*3/uL (ref 1.7–7.7)
Platelets: 234 10*3/uL (ref 150–400)
RBC: 4.15 MIL/uL — ABNORMAL LOW (ref 4.22–5.81)
RDW: 16.6 % — AB (ref 11.5–15.5)
WBC: 6.6 10*3/uL (ref 4.0–10.5)

## 2015-05-12 LAB — COMPREHENSIVE METABOLIC PANEL
ALBUMIN: 3.5 g/dL (ref 3.5–5.0)
ALT: 13 U/L — ABNORMAL LOW (ref 17–63)
ANION GAP: 8 (ref 5–15)
AST: 19 U/L (ref 15–41)
Alkaline Phosphatase: 117 U/L (ref 38–126)
BUN: 5 mg/dL — ABNORMAL LOW (ref 6–20)
CO2: 27 mmol/L (ref 22–32)
Calcium: 9.1 mg/dL (ref 8.9–10.3)
Chloride: 105 mmol/L (ref 101–111)
Creatinine, Ser: 0.73 mg/dL (ref 0.61–1.24)
GFR calc Af Amer: >60 mL/min (ref >60)
GFR calc non Af Amer: >60 mL/min (ref >60)
GLUCOSE: 101 mg/dL — AB (ref 65–99)
POTASSIUM: 3.8 mmol/L (ref 3.5–5.1)
SODIUM: 140 mmol/L (ref 135–145)
Total Bilirubin: 0.4 mg/dL (ref 0.3–1.2)
Total Protein: 7.2 g/dL (ref 6.5–8.1)

## 2015-05-12 MED ORDER — FENTANYL 75 MCG/HR TD PT72: 150.0000 ug | MEDICATED_PATCH | TRANSDERMAL | Status: DC

## 2015-05-12 MED ORDER — DEXTROSE 5 % IV SOLN
85.0000 mg/m2 | Freq: Once | INTRAVENOUS | Status: AC
  Administered 2015-05-12: 165 mg via INTRAVENOUS
  Filled 2015-05-12: qty 33

## 2015-05-12 MED ORDER — DEXTROSE 5 % IV SOLN
Freq: Once | INTRAVENOUS | Status: AC
  Administered 2015-05-12: 12:00:00 via INTRAVENOUS

## 2015-05-12 MED ORDER — LEUCOVORIN CALCIUM INJECTION 100 MG
20.0000 mg/m2 | Freq: Once | INTRAMUSCULAR | Status: AC
  Administered 2015-05-12: 38 mg via INTRAVENOUS
  Filled 2015-05-12: qty 1.9

## 2015-05-12 MED ORDER — SODIUM CHLORIDE 0.9 % IV SOLN
5.0000 mg/kg | Freq: Once | INTRAVENOUS | Status: AC
  Administered 2015-05-12: 400 mg via INTRAVENOUS
  Filled 2015-05-12: qty 16

## 2015-05-12 MED ORDER — LEUCOVORIN CALCIUM INJECTION 350 MG: 400.0000 mg/m2 | Freq: Once | INTRAVENOUS | Status: DC

## 2015-05-12 MED ORDER — HYDROMORPHONE HCL 4 MG PO TABS: 4.0000 mg | ORAL_TABLET | ORAL | Status: DC | PRN

## 2015-05-12 MED ORDER — FLUOROURACIL CHEMO INJECTION 5 GM/100ML
2400.0000 mg/m2 | INTRAVENOUS | Status: DC
  Administered 2015-05-12: 4650 mg via INTRAVENOUS
  Filled 2015-05-12: qty 93

## 2015-05-12 MED ORDER — SODIUM CHLORIDE 0.9 % IJ SOLN: 10.0000 mL | INTRAMUSCULAR | Status: DC | PRN

## 2015-05-12 MED ORDER — SODIUM CHLORIDE 0.9 % IV SOLN
Freq: Once | INTRAVENOUS | Status: AC
  Administered 2015-05-12: 12:00:00 via INTRAVENOUS
  Filled 2015-05-12: qty 4

## 2015-05-12 MED ORDER — PREGABALIN 150 MG PO CAPS: 150.0000 mg | ORAL_CAPSULE | Freq: Two times a day (BID) | ORAL | Status: DC

## 2015-05-12 MED ORDER — SODIUM CHLORIDE 0.9 % IV SOLN
Freq: Once | INTRAVENOUS | Status: AC
  Administered 2015-05-12: 12:00:00 via INTRAVENOUS

## 2015-05-12 NOTE — Patient Instructions (Signed)
[facility] Discharge Instructions for Patients Receiving Chemotherapy  Today you received the following chemotherapy agents: Leucovorin, oxaliplatin, and 81f.     If you develop nausea and vomiting, or diarrhea that is not controlled by your medication, call the clinic.  The clinic phone number is [phone] Office hours are Monday-Friday 8:30am-5:00pm.  BELOW ARE SYMPTOMS THAT SHOULD BE REPORTED IMMEDIATELY:  *FEVER GREATER THAN 101.0 F  *CHILLS WITH OR WITHOUT FEVER  NAUSEA AND VOMITING THAT IS NOT CONTROLLED WITH YOUR NAUSEA MEDICATION  *UNUSUAL SHORTNESS OF BREATH  *UNUSUAL BRUISING OR BLEEDING  TENDERNESS IN MOUTH AND THROAT WITH OR WITHOUT PRESENCE OF ULCERS  *URINARY PROBLEMS  *BOWEL PROBLEMS  UNUSUAL RASH Items with * indicate a potential emergency and should be followed up as soon as possible. If you have an emergency after office hours please contact your primary care physician or go to the nearest emergency department.  Please call the clinic during office hours if you have any questions or concerns.   You may also contact the Patient Navigator at [phone] you have any questions or need assistance in obtaining follow up care.

## 2015-05-12 NOTE — Patient Instructions (Signed)
[place] at [facility] Discharge Instructions  RECOMMENDATIONS MADE BY THE CONSULTANT AND ANY TEST RESULTS WILL BE SENT TO YOUR REFERRING PHYSICIAN.   Exam completed by Kirby Crigler today Return to see the doctor in 2 weeks Chemotherapy as scheduled today Return Thursday to remove pump CT scans 1-2 weeks for restaging  Refills on Fentanyl, Dilaudid, and Lyrica  Please call the clinic if you have any questions or concerns    Thank you for choosing [place] at [facility] to provide your oncology and hematology care.  To afford each patient quality time with our provider, please arrive at least 15 minutes before your scheduled appointment time.    You need to re-schedule your appointment should you arrive 10 or more minutes late.  We strive to give you quality time with our providers, and arriving late affects you and other patients whose appointments are after yours.  Also, if you no show three or more times for appointments you may be dismissed from the clinic at the providers discretion.     Again, thank you for choosing [facility].  Our hope is that these requests will decrease the amount of time that you wait before being seen by our physicians.       _____________________________________________________________  Should you have questions after your visit to [facility], please contact our office at [phone] between the hours of 8:30 a.m. and 4:30 p.m.  Voicemails left after 4:30 p.m. will not be returned until the following business day.  For prescription refill requests, have your pharmacy contact our office.

## 2015-05-12 NOTE — Progress Notes (Signed)
Patient tolerated infusion well.  VSS.   

## 2015-05-12 NOTE — Progress Notes (Signed)
[place] Clinical Social Work  Clinical Social Work was referred by [place] rounding for assessment of psychosocial needs due to past concerns and need to check in. Clinical Social Worker met with patient at [facility] to offer support and assess for needs.  Pt denies new concerns, but continues to have financial concerns. Pt has been utilizing local resources to assist and there is not a current crisis, just ongoing need. Pt reports his wife had found employment, but is off due to recent oral surgery. They have also continued to receive some financial assistance from [employer]. CSW encouraged pt to attend [employer] as well. He had not attended to date. No new concerns or needs at this time. CSW will cont to check in as needs arise.     Clinical Social Work interventions: Reassess needs   Loren Racer, [place] Tuesdays   [phone]

## 2015-05-13 ENCOUNTER — Telehealth (HOSPITAL_COMMUNITY): Payer: Self-pay | Admitting: *Deleted

## 2015-05-13 ENCOUNTER — Other Ambulatory Visit (HOSPITAL_COMMUNITY): Payer: Self-pay | Admitting: Emergency Medicine

## 2015-05-13 NOTE — Telephone Encounter (Signed)
Rx was printed yesterday with his other pain meds.  He has the Rx.  Robynn Pane, PA-C 05/13/2015 12:53 PM

## 2015-05-13 NOTE — Progress Notes (Signed)
Pt notified that we had given him a prescription refilling his lyrica yesterday.  He states that the pharmacy told him that it was 7 days too early to fill. I asked him if he was taking more than prescribed he said no.  I told him that we give him 60 tablets and it is prescribed twice a day and that it should last a month.  He said the pharmacy must have made a mistake.  He would just have to go through "DTs"  Kirby Crigler PA notified

## 2015-05-14 ENCOUNTER — Encounter (HOSPITAL_BASED_OUTPATIENT_CLINIC_OR_DEPARTMENT_OTHER): Payer: Medicaid Other

## 2015-05-14 VITALS — BP 156/96 | HR 73 | Temp 97.6°F | Resp 22

## 2015-05-14 DIAGNOSIS — C7800 Secondary malignant neoplasm of unspecified lung: Principal | ICD-10-CM

## 2015-05-14 DIAGNOSIS — C20 Malignant neoplasm of rectum: Secondary | ICD-10-CM

## 2015-05-14 DIAGNOSIS — Z452 Encounter for adjustment and management of vascular access device: Secondary | ICD-10-CM | POA: Diagnosis present

## 2015-05-14 DIAGNOSIS — Z95828 Presence of other vascular implants and grafts: Secondary | ICD-10-CM

## 2015-05-14 MED ORDER — HEPARIN SOD (PORK) LOCK FLUSH 100 UNIT/ML IV SOLN
INTRAVENOUS | Status: AC
  Filled 2015-05-14: qty 5

## 2015-05-14 MED ORDER — SODIUM CHLORIDE 0.9 % IJ SOLN
10.0000 mL | INTRAMUSCULAR | Status: DC | PRN
  Administered 2015-05-14: 10 mL
  Filled 2015-05-14: qty 10

## 2015-05-14 MED ORDER — HEPARIN SOD (PORK) LOCK FLUSH 100 UNIT/ML IV SOLN: 500.0000 [IU] | Freq: Once | INTRAVENOUS | Status: AC | PRN

## 2015-05-14 MED ORDER — HEPARIN SOD (PORK) LOCK FLUSH 100 UNIT/ML IV SOLN
500.0000 [IU] | Freq: Once | INTRAVENOUS | Status: AC
  Administered 2015-05-14: 500 [IU] via INTRAVENOUS

## 2015-05-14 NOTE — Patient Instructions (Signed)
[place] at [facility] Discharge Instructions  RECOMMENDATIONS MADE BY THE CONSULTANT AND ANY TEST RESULTS WILL BE SENT TO YOUR REFERRING PHYSICIAN.  Discontinued continuous infusion pump. Return as scheduled.  Thank you for choosing [place] at [facility] to provide your oncology and hematology care.  To afford each patient quality time with our provider, please arrive at least 15 minutes before your scheduled appointment time.    You need to re-schedule your appointment should you arrive 10 or more minutes late.  We strive to give you quality time with our providers, and arriving late affects you and other patients whose appointments are after yours.  Also, if you no show three or more times for appointments you may be dismissed from the clinic at the providers discretion.     Again, thank you for choosing [facility].  Our hope is that these requests will decrease the amount of time that you wait before being seen by our physicians.       _____________________________________________________________  Should you have questions after your visit to [facility], please contact our office at [phone] between the hours of 8:30 a.m. and 4:30 p.m.  Voicemails left after 4:30 p.m. will not be returned until the following business day.  For prescription refill requests, have your pharmacy contact our office.

## 2015-05-14 NOTE — Progress Notes (Signed)
Discontinued continuous infusion pump. Flushed port per protocol. De-accessed port needle. Patient ambulatory on discharge home to self.

## 2015-05-20 ENCOUNTER — Ambulatory Visit (HOSPITAL_COMMUNITY)
Admission: RE | Admit: 2015-05-20 | Discharge: 2015-05-20 | Disposition: A | Discharge status: Home / Self Care | Payer: Medicaid Other | Source: Ambulatory Visit | Attending: Oncology | Admitting: Oncology

## 2015-05-20 DIAGNOSIS — Z933 Colostomy status: Secondary | ICD-10-CM | POA: Insufficient documentation

## 2015-05-20 DIAGNOSIS — C7800 Secondary malignant neoplasm of unspecified lung: Secondary | ICD-10-CM

## 2015-05-20 DIAGNOSIS — C7801 Secondary malignant neoplasm of right lung: Secondary | ICD-10-CM | POA: Insufficient documentation

## 2015-05-20 DIAGNOSIS — Z9049 Acquired absence of other specified parts of digestive tract: Secondary | ICD-10-CM | POA: Diagnosis not present

## 2015-05-20 DIAGNOSIS — C7802 Secondary malignant neoplasm of left lung: Secondary | ICD-10-CM | POA: Diagnosis not present

## 2015-05-20 DIAGNOSIS — C20 Malignant neoplasm of rectum: Secondary | ICD-10-CM | POA: Insufficient documentation

## 2015-05-20 MED ORDER — IOHEXOL 300 MG/ML  SOLN
100.0000 mL | Freq: Once | INTRAMUSCULAR | Status: AC | PRN
  Administered 2015-05-20: 100 mL via INTRAVENOUS

## 2015-05-26 ENCOUNTER — Encounter (HOSPITAL_BASED_OUTPATIENT_CLINIC_OR_DEPARTMENT_OTHER): Payer: Medicaid Other

## 2015-05-26 ENCOUNTER — Encounter (HOSPITAL_COMMUNITY): Payer: Self-pay | Admitting: Hematology & Oncology

## 2015-05-26 ENCOUNTER — Encounter (HOSPITAL_BASED_OUTPATIENT_CLINIC_OR_DEPARTMENT_OTHER): Payer: Medicaid Other | Admitting: Hematology & Oncology

## 2015-05-26 VITALS — BP 130/80 | HR 95 | Temp 97.8°F | Resp 20 | Wt 178.0 lb

## 2015-05-26 DIAGNOSIS — K6289 Other specified diseases of anus and rectum: Secondary | ICD-10-CM

## 2015-05-26 DIAGNOSIS — C7800 Secondary malignant neoplasm of unspecified lung: Secondary | ICD-10-CM

## 2015-05-26 DIAGNOSIS — Z5112 Encounter for antineoplastic immunotherapy: Secondary | ICD-10-CM

## 2015-05-26 DIAGNOSIS — C20 Malignant neoplasm of rectum: Secondary | ICD-10-CM

## 2015-05-26 DIAGNOSIS — Z5111 Encounter for antineoplastic chemotherapy: Secondary | ICD-10-CM | POA: Diagnosis not present

## 2015-05-26 DIAGNOSIS — R918 Other nonspecific abnormal finding of lung field: Secondary | ICD-10-CM | POA: Diagnosis not present

## 2015-05-26 DIAGNOSIS — L299 Pruritus, unspecified: Secondary | ICD-10-CM

## 2015-05-26 LAB — CBC WITH DIFFERENTIAL/PLATELET
BASOS ABS: 0 10*3/uL (ref 0.0–0.1)
Basophils Relative: 0 %
EOS ABS: 0.1 10*3/uL (ref 0.0–0.7)
EOS PCT: 1 %
HCT: 38.8 % — ABNORMAL LOW (ref 39.0–52.0)
Hemoglobin: 12.7 g/dL — ABNORMAL LOW (ref 13.0–17.0)
LYMPHS ABS: 0.9 10*3/uL (ref 0.7–4.0)
LYMPHS PCT: 8 %
MCH: 32 pg (ref 26.0–34.0)
MCHC: 32.7 g/dL (ref 30.0–36.0)
MCV: 97.7 fL (ref 78.0–100.0)
MONO ABS: 0.8 10*3/uL (ref 0.1–1.0)
Monocytes Relative: 7 %
NEUTROS PCT: 84 %
Neutro Abs: 9.1 10*3/uL — ABNORMAL HIGH (ref 1.7–7.7)
PLATELETS: 219 10*3/uL (ref 150–400)
RBC: 3.97 MIL/uL — AB (ref 4.22–5.81)
RDW: 16.9 % — AB (ref 11.5–15.5)
WBC: 10.9 10*3/uL — AB (ref 4.0–10.5)

## 2015-05-26 LAB — COMPREHENSIVE METABOLIC PANEL
ALT: 14 U/L — AB (ref 17–63)
AST: 17 U/L (ref 15–41)
Albumin: 3.5 g/dL (ref 3.5–5.0)
Alkaline Phosphatase: 110 U/L (ref 38–126)
Anion gap: 9 (ref 5–15)
BUN: 6 mg/dL (ref 6–20)
CHLORIDE: 101 mmol/L (ref 101–111)
CO2: 25 mmol/L (ref 22–32)
Calcium: 8.6 mg/dL — ABNORMAL LOW (ref 8.9–10.3)
Creatinine, Ser: 0.59 mg/dL — ABNORMAL LOW (ref 0.61–1.24)
Glucose, Bld: 120 mg/dL — ABNORMAL HIGH (ref 65–99)
POTASSIUM: 3.7 mmol/L (ref 3.5–5.1)
SODIUM: 135 mmol/L (ref 135–145)
Total Bilirubin: 0.4 mg/dL (ref 0.3–1.2)
Total Protein: 7.1 g/dL (ref 6.5–8.1)

## 2015-05-26 LAB — URINALYSIS, DIPSTICK ONLY
BILIRUBIN URINE: NEGATIVE
Glucose, UA: NEGATIVE mg/dL
Hgb urine dipstick: NEGATIVE
KETONES UR: NEGATIVE mg/dL
Leukocytes, UA: NEGATIVE
NITRITE: NEGATIVE
Protein, ur: NEGATIVE mg/dL
SPECIFIC GRAVITY, URINE: 1.01 (ref 1.005–1.030)
pH: 5.5 (ref 5.0–8.0)

## 2015-05-26 MED ORDER — AZITHROMYCIN 250 MG PO TABS: ORAL_TABLET | ORAL | Status: DC

## 2015-05-26 MED ORDER — SODIUM CHLORIDE 0.9 % IV SOLN
Freq: Once | INTRAVENOUS | Status: AC
  Administered 2015-05-26: 11:00:00 via INTRAVENOUS
  Filled 2015-05-26: qty 4

## 2015-05-26 MED ORDER — DEXTROSE 5 % IV SOLN
400.0000 mg/m2 | Freq: Once | INTRAVENOUS | Status: DC
Start: 2015-05-26 — End: 2015-05-26

## 2015-05-26 MED ORDER — DEXTROSE 5 % IV SOLN
Freq: Once | INTRAVENOUS | Status: AC
  Administered 2015-05-26: 12:00:00 via INTRAVENOUS

## 2015-05-26 MED ORDER — HYDROMORPHONE HCL 4 MG PO TABS: 4.0000 mg | ORAL_TABLET | ORAL | Status: DC | PRN

## 2015-05-26 MED ORDER — LEUCOVORIN CALCIUM INJECTION 100 MG
20.0000 mg/m2 | Freq: Once | INTRAMUSCULAR | Status: AC
  Administered 2015-05-26: 38 mg via INTRAVENOUS
  Filled 2015-05-26: qty 1.9

## 2015-05-26 MED ORDER — ACETAMINOPHEN 325 MG PO TABS
650.0000 mg | ORAL_TABLET | Freq: Once | ORAL | Status: AC
  Administered 2015-05-26: 650 mg via ORAL

## 2015-05-26 MED ORDER — OXALIPLATIN CHEMO INJECTION 100 MG/20ML
85.0000 mg/m2 | Freq: Once | INTRAVENOUS | Status: AC
  Administered 2015-05-26: 165 mg via INTRAVENOUS
  Filled 2015-05-26: qty 33

## 2015-05-26 MED ORDER — DIPHENHYDRAMINE HCL 50 MG/ML IJ SOLN
25.0000 mg | Freq: Once | INTRAMUSCULAR | Status: AC
  Administered 2015-05-26: 25 mg via INTRAVENOUS

## 2015-05-26 MED ORDER — ALBUTEROL SULFATE HFA 108 (90 BASE) MCG/ACT IN AERS: 2.0000 | INHALATION_SPRAY | Freq: Four times a day (QID) | RESPIRATORY_TRACT | Status: DC | PRN

## 2015-05-26 MED ORDER — METHYLPREDNISOLONE SODIUM SUCC 125 MG IJ SOLR
125.0000 mg | Freq: Once | INTRAMUSCULAR | Status: AC
  Administered 2015-05-26: 125 mg via INTRAVENOUS

## 2015-05-26 MED ORDER — SODIUM CHLORIDE 0.9 % IV SOLN
2400.0000 mg/m2 | INTRAVENOUS | Status: DC
  Administered 2015-05-26: 4650 mg via INTRAVENOUS
  Filled 2015-05-26: qty 93

## 2015-05-26 MED ORDER — SODIUM CHLORIDE 0.9 % IV SOLN
Freq: Once | INTRAVENOUS | Status: AC
  Administered 2015-05-26: 11:00:00 via INTRAVENOUS

## 2015-05-26 MED ORDER — SODIUM CHLORIDE 0.9 % IV SOLN
5.0000 mg/kg | Freq: Once | INTRAVENOUS | Status: AC
  Administered 2015-05-26: 400 mg via INTRAVENOUS
  Filled 2015-05-26: qty 16

## 2015-05-26 MED ORDER — ACETAMINOPHEN 325 MG PO TABS
ORAL_TABLET | ORAL | Status: AC
Start: 2015-05-26 — End: 2015-05-26
  Filled 2015-05-26: qty 2

## 2015-05-26 MED ORDER — SODIUM CHLORIDE 0.9 % IJ SOLN
10.0000 mL | INTRAMUSCULAR | Status: DC | PRN
  Administered 2015-05-26: 10 mL
  Filled 2015-05-26: qty 10

## 2015-05-26 NOTE — Progress Notes (Signed)
..  Andrew Kline arrived today for chemo. Scans and labs discussed with Dr. [name] and chemo started. 1420 Patient reports itching when oxaliplatin completed and starting to push leucovorin. Noted redness over back abdomen arms and legs. Conversed with T. Kefalas PA-C and orders received and meds given at 1427. 1435 patient reports at least 25% reduction in itching.  1500- no further itching Tolerated remainder of treatment without itching or complaints

## 2015-05-26 NOTE — Progress Notes (Signed)
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    09/25/2014 Imaging CT abd/pelvis- Indeterminate nodules within the bilateral lung bases with dominant approximately 1.7 cm centrally necrotic nodule within the right lower lobe...   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   10/03/2014 Tumor Marker CEA NOT ELEVATED   10/09/2014 Imaging CT Chest- Bilateral pulmonary nodules of varying size are most consistent with pulmonary metastasis. There are approximately 6 lesions in each lung.   10/17/2014 Pathology Results Rectum, biopsy - INVASIVE ADENOCARCINOMA   10/29/2014 Pathology Results Diagnosis Lung, needle/core biopsy(ies), right lower lobe - METASTATIC ADENOCARCINOMA   11/04/2014 - 12/18/2014 Radiation Therapy Dr. Lisbeth Renshaw   11/05/2014 -  Chemotherapy FOLFOX + Avastin   02/11/2015 Surgery Colostomy formation by Dr. Arnoldo Morale.         HISTORY OF PRESENTING ILLNESS:  Andrew Kline 50 y.o. male is here because of stage IV rectal cancer.  He is here alone today. He goes by Andrew Kline. .   Andrew Kline is here alone today. Notes decreased overall strength and physical ability. However, he notes that he does feel well overall. His pain is much better on lyrica.  He has rectal discharge that bothers him. At times he cannot control it such as when he "sneezes or coughs and he finds this frustrating."  Andrew Kline was prescribed an antibiotic about one month ago that partially resolved  his current congestion and cough. He notes however that it is still lingering. He denies fever. But he still has productive sputum.  States that he does not have control over when he coughs, especially at night. He continues to smoke but has cut down to 1/4 ppd.   He dreads Thursdays and Fridays due to low energy levels after treatment.  Reports tingling in the hands and feet which throw off his "dexterity". He is able to write, draw, cook, and walk without issue. However, he sometimes will drop things. He denies the inability to button shirts, his feet do not bother him.   He has discontinued flomax by urology's suggestion and will follow-up with them.  Patient requesting refill of dilaudid. He is here today to review repeat imaging and for further follow-up of his stage IV CRC.   MEDICAL HISTORY:  Past Medical History  Diagnosis Date  . Rectal pain   . Arthritis   . Renal cell cancer ([place])     2012.   [name] Rectal cancer ([facility])   . Shortness of breath dyspnea   . Anxiety   . Depression   . Kidney stone 03/22/15    left    SURGICAL HISTORY: Past Surgical History  Procedure Laterality Date  . Hypospadias correction    . Laparoscopic partial nephrectomy  2012    right side  . Knee surgery Left     arthroscopy  . Flexible sigmoidoscopy N/A 09/30/2014    Procedure: FLEXIBLE SIGMOIDOSCOPY;  Surgeon: Danie Binder, MD;  Location: [facility];  Service: Endoscopy;  Laterality: N/A;  . Esophageal biopsy  N/A 09/30/2014    Procedure: BIOPSY;  Surgeon: Danie Binder, MD;  Location: [facility];  Service: Endoscopy;  Laterality: N/A;  Anal Canal  . Flexible sigmoidoscopy N/A 10/17/2014    Procedure: FLEXIBLE SIGMOIDOSCOPY;  Surgeon: Danie Binder, MD;  Location: [facility] SU[facility];  Service: Endoscopy;  Laterality: N/A;  1325  . Lung biopsy Right   . Colostomy N/A 02/11/2015    Procedure: COLOSTOMY;  Surgeon: Aviva Signs, MD;  Location: [facility];  Service: General;  Laterality: N/A;    SOCIAL  HISTORY: Social History   Social History  . Marital Status: Divorced    Spouse Name: N/A  . Number of Children: 2  . Years of Education: N/A   Occupational History  . retail    Social History Main Topics  . Smoking status: Current Every Day Smoker -- 0.50 packs/day for 30 years    Types: Cigarettes  . Smokeless tobacco: Not on file     Comment: a little over a pack daily  . Alcohol Use: 0.0 oz/week    0 Standard drinks or equivalent per week     Comment: Occ  . Drug Use: No  . Sexual Activity: Yes    Birth Control/ Protection: None   Other Topics Concern  . Not on file   Social History Narrative  He is from [place], [place]. Moved here in March. Smoker, 1 ppd. Was 1.5 ppd. ETOH, none. 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 He is divorced. Lives with fiance. 2 children. 52 and 83 yo. No grandchildren.  FAMILY HISTORY: Family History  Problem Relation Age of Onset  . Lung cancer Maternal Grandfather   . Colon cancer Maternal Uncle     age 64  . Diabetes Other   . Healthy Mother    indicated that his mother is alive. He indicated that his father is alive.   Mother living, 43 yo. Still working as a Audiological scientist. Father living, 69-70 yo. 1 brother, 1 sister, 1 step sister. Maternal uncle has colon cancer. He is married to a GI nurse. So he has people to go to with questions and is informed on what is happening. History of diabetes in the family. No history of heart disease in the family.  ALLERGIES:  has No Known Allergies.  MEDICATIONS:  Current Outpatient Prescriptions  Medication Sig Dispense Refill  . amitriptyline (ELAVIL) 50 MG tablet Take 1 tablet (50 mg total) by mouth at bedtime. 30 tablet 2  . Bevacizumab (AVASTIN IV) Inject into the vein every 14 (fourteen) days. To start November 19, 2014    . citalopram (CELEXA) 40 MG tablet TAKE 1/2 TABLET BY MOUTH FOR 5  DAYS  THEN ONE TABLET DAILY THEREAFTER. (Patient taking differently: Take 40 mg by mouth daily. ) 30 tablet 1  . dextrose 5 % SOLN 1,000 mL with fluorouracil 5 GM/100ML SOLN Inject into the vein every 14 (fourteen) days. To start November 05, 2014. To infuse over 46 hours via pump.    [name] docusate sodium (COLACE) 100 MG capsule Take 1 capsule (100 mg total) by mouth every 12 (twelve) hours. (Patient taking differently: Take 100 mg by mouth at bedtime. ) 60 capsule 0  . DULoxetine (CYMBALTA) 30 MG capsule Take one capsule daily for 5 days then 2 daily thereafter (Patient taking differently: Take 30 mg by mouth daily. ) 60 capsule 3  . fentaNYL (DURAGESIC -  DOSED MCG/HR) 75 MCG/HR Place 2 patches (150 mcg total) onto the skin every 3 (three) days. 20 patch 0  . HYDROmorphone (DILAUDID) 4 MG tablet Take 1 tablet (4 mg total) by mouth every 4 (four) hours as needed for severe pain. 60 tablet 0  . leucovorin 50 MG injection Inject into the vein every 14 (fourteen) days. To start November 05, 2014    . lidocaine-hydrocortisone (ANAMANTLE) 3-1 % KIT Place 1 application rectally 2 (two) times daily. For two weeks, alternate with Rectiv. 28 each 1  . lidocaine-prilocaine (EMLA) cream Apply a quarter size amount to port site 1 hour prior to chemo. Do not rub in. Cover with plastic wrap. 30 g 3  . nitroGLYCERIN (NITROGLYN) 2 % ointment Apply 0.5 inches topically daily. Anal area    . ondansetron (ZOFRAN ODT) 4 MG disintegrating tablet 93m ODT q4 hours prn nausea/vomit (Patient not taking: Reported on 03/31/2015) 20 tablet 0  . ondansetron (ZOFRAN) 4 mg TABS tablet Take 4 tablets by mouth every 8 (eight) hours as needed. 4 tablet 0  . OXALIPLATIN IV Inject into the vein every 14 (fourteen) days. To start November 05, 2014    . potassium chloride SA (K-DUR,KLOR-CON) 20 MEQ tablet Take 1 tablet (20 mEq total) by mouth 2 (two) times daily. (Patient not taking: Reported on 03/31/2015) 30 tablet 0  . pregabalin (LYRICA) 150 MG capsule  Take 1 capsule (150 mg total) by mouth 2 (two) times daily. 60 capsule 3  . prochlorperazine (COMPAZINE) 10 MG tablet Take 1 tablet (10 mg total) by mouth every 6 (six) hours as needed for nausea or vomiting. 60 tablet 0  . tamsulosin (FLOMAX) 0.4 MG CAPS capsule Take 1 capsule (0.4 mg total) by mouth daily. (Patient not taking: Reported on 04/14/2015) 30 capsule 0   No current facility-administered medications for this visit.   Facility-Administered Medications Ordered in Other Visits  Medication Dose Route Frequency Provider Last Rate Last Dose  . 0.9 %  sodium chloride infusion   Intravenous Continuous SKelby FamPenland, MD      . sodium chloride 0.9 % injection 10 mL  10 mL Intracatheter PRN SPatrici Ranks MD   10 mL at 05/26/15 0859    Review of Systems  Constitutional: Positive for malaise/fatigue, improving.  HENT: Positive for runny nose. Respiratory: Positive for congestion, sputum production, and cough. Gastrointestinal: Negative for blood in stool, diarrhea. Occasional rectal discharge Genitourinary: Negative Psychiatric/Behavioral: Positive for ANXIETY.  14 point ROS was done and is otherwise as detailed above or in HPI   PHYSICAL EXAMINATION:  ECOG PERFORMANCE STATUS: 1 - Symptomatic but completely ambulatory Vitals - 1 value per visit 12/77/8242 SYSTOLIC 1353 DIASTOLIC 80  Pulse 95  Temperature 97.8  Respirations 20  Weight (lb) 178  Height   BMI 28.74    Physical Exam  Constitutional: He is oriented to person, place, and time and well-developed, well-nourished, and in no distress.  HENT:  Head: Normocephalic and atraumatic.  Nose: Nose normal.  Mouth/Throat: Oropharynx is clear and moist. No oropharyngeal exudate.  Dentures on top.  Eyes: Conjunctivae and EOM are normal. Pupils are equal, round, and reactive to light. Right eye exhibits no discharge. Left eye exhibits no discharge. No scleral icterus.  Neck: Normal range of motion. Neck supple. No  tracheal deviation present. No thyromegaly present.  Cardiovascular: Normal rate, regular rhythm and normal heart sounds.  Exam reveals no gallop and no friction rub.   No murmur heard. Pulmonary/Chest:  Occasional coarse rhonchi and wheezing throughout. Abdominal: Soft. Bowel sounds are normal. He exhibits no distension and no mass. There is no tenderness. There is no rebound and no guarding. Ostomy is c/d/i  Genitourinary:   Musculoskeletal: Normal range of motion. He exhibits no edema.  Lymphadenopathy:    He has no cervical adenopathy.  Neurological: He is alert and oriented to person, place, and time. He has normal reflexes. No cranial nerve deficit. Gait normal. Coordination normal.  Skin: Skin is warm and dry. No rash noted.  Psychiatric: Mood, memory, affect and judgment normal.  Nursing note and vitals reviewed.   LABORATORY DATA:  I have reviewed the data as listed Lab Results  Component Value Date   WBC 10.9* 05/26/2015   HGB 12.7* 05/26/2015   HCT 38.8* 05/26/2015   MCV 97.7 05/26/2015   PLT 219 05/26/2015   CMP     Component Value Date/Time   NA 135 05/26/2015 0844   K 3.7 05/26/2015 0844   CL 101 05/26/2015 0844   CO2 25 05/26/2015 0844   GLUCOSE 120* 05/26/2015 0844   BUN 6 05/26/2015 0844   CREATININE 0.59* 05/26/2015 0844   CALCIUM 8.6* 05/26/2015 0844   PROT 7.1 05/26/2015 0844   ALBUMIN 3.5 05/26/2015 0844   AST 17 05/26/2015 0844   ALT 14* 05/26/2015 0844   ALKPHOS 110 05/26/2015 0844   BILITOT 0.4 05/26/2015 0844   GFRNONAA >60 05/26/2015 0844   GFRAA >60 05/26/2015 0844   RADIOLOGY: I have personally reviewed the radiological images as listed and agreed with the findings in the report.   Study Result     CLINICAL DATA: Stage IV rectal cancer, diagnosed May 2016, with lung metastases. Status post surgery, radiation, and chemotherapy.  EXAM: CT CHEST, ABDOMEN, AND PELVIS WITH CONTRAST  TECHNIQUE: Multidetector CT imaging of the chest,  abdomen and pelvis was performed following the standard protocol during bolus administration of intravenous contrast.  CONTRAST: 182m OMNIPAQUE IOHEXOL 300 MG/ML SOLN  COMPARISON: CT abdomen pelvis dated 03/22/2015. CT chest abdomen pelvis dated 02/26/2015.  FINDINGS: CT CHEST FINDINGS  Mediastinum/Nodes: The heart is normal in size. No pericardial effusion.  Coronary atherosclerosis in the LAD.  Small mediastinal lymph nodes, including a 6 mm short axis right paratracheal node, a 5 mm short axis AP window node, and an 8 mm short axis subcarinal node, unchanged.  Right chest port terminates at the cavoatrial junction.  Lungs/Pleura: Scattered bilateral pulmonary nodules, including:  --8 mm nodular opacity in the left upper lobe (series 5/image 16), previously 5 mm  --6 mm nodule in the superior segment left lower lobe (series 5/ image 23), previously 5 mm  --19 x 19 mm cavitary nodule in the right lower lobe (series 5/ image 29), previously 22 x 23 mm, but with increasing solid component along the right lateral aspect now measuring up to 17 mm, previously 10 mm  --11 mm nodule in the medial right middle lobe (series 5/ image 32), previously 9 mm  --10 mm lingular nodular opacity (series 5/image 46), unchanged  Underlying mild emphysematous changes. No focal consolidation.  No pleural effusion or pneumothorax.  Musculoskeletal: Degenerative changes of the thoracic spine.  CT ABDOMEN PELVIS FINDINGS  Hepatobiliary: Liver is within normal limits. No suspicious/enhancing hepatic lesions.  Gallbladder is unremarkable. No intrahepatic or extrahepatic ductal dilatation.  Pancreas: Parenchymal calcifications, reflecting sequela of prior/chronic pancreatitis.  Spleen: Within normal limits.  Adrenals/Urinary Tract: Adrenal glands are within normal limits.  Scarring/ postsurgical changes along the  medial right lower kidney, reflecting  prior partial nephrectomy. Left kidney is within normal limits. No hydronephrosis.  Bladder is within normal limits.  Stomach/Bowel: Stomach is within normal limits.  Normal appendix (series 2/image 85).  Status post left hemicolectomy with left mid abdominal colostomy. Hartman's pouch.  Vascular/Lymphatic: Atherosclerotic calcifications of the abdominal aorta and branch vessels. No evidence of abdominal aortic aneurysm.  No suspicious abdominopelvic lymphadenopathy.  Reproductive: Prostate is unremarkable, noting mild dystrophic calcifications.  Other: No abdominopelvic ascites.  Musculoskeletal: Mild degenerative changes of the lumbar spine.  IMPRESSION: Status post left hemicolectomy with left mid abdominal colostomy and Hartman's pouch.  Mild progression of bilateral pulmonary metastases, including a 1.9 cm cavitary nodule in the right lower lobe with increasing solid component.  No evidence of metastatic disease in the abdomen/pelvis.   Electronically Signed  By: Julian Hy M.D.  On: 05/20/2015 11:40    ASSESSMENT & PLAN:  STAGE IV Rectal vs. Anal carcinoma, adenocarcinoma Pulmonary metastases Good PS History of partial nephrectomy 2012 at [facility] in [place] FOLFOX/AVASTIN XRT for palliation Colostomy for palliation of poor bowel control Lyrica for pain, with marked improvement in QOL and pain scores Contact Dermatitis from Fentanyl patch  He has evidence of mild progression by imaging. CEA is normal and has been normal since diagnosis, it is not helpful for monitoring his disease. He has received 14 cycles of FOLFOX/AVASTIN with excellent tolerance and realistically we could make the argument to continue however after a significant discussion we have opted to change therapy to FOLFIRI/AVASTIN. He will continue with FOLFOX today, teaching will be provided regarding irinotecan and he will start FOLFIRI therapy in 2  weeks.   He overall looks well. He is doing so much better and that is an obvious relief. Lyrica has drastically reduced his pain. I have increased it today to 300 mg daily total.  We addressed his living will. I continue to discuss smoking cessation.  We discussed incorporating physical activity into his daily routine to slowly increase his strength.   I have prescribed the patient an albuterol inhaler for persistent cough and congestion. I have also prescribed a 10 day course of zithromax.   Patient requesting refill of dilaudid. I have refilled his dilaudid today.  This note was electronically signed.   This document serves as a record of services personally performed by Ancil Linsey, MD. It was created on her behalf by Arlyce Harman, a trained medical scribe. The creation of this record is based on the scribe's personal observations and the provider's statements to them. This document has been checked and approved by the attending provider.  I have reviewed the above documentation for accuracy and completeness, and I agree with the above.   Kelby Fam. [name], MD

## 2015-05-26 NOTE — Patient Instructions (Signed)
..  [facility] Discharge Instructions for Patients Receiving Chemotherapy   Beginning January 23rd 2017 lab work for the [facility] will be done in the  Main lab at [facility] on 1st floor. If you have a lab appointment with the [facility] please come in thru the  Main Entrance and check in at the main information desk   Today you received the following chemotherapy agents oxaliplatin, leucovorin, 2f and avastin You had itching with oxaliplatin today and luckily this was your last treatment with oxaliplatin. We will switch your chemo to irinotecan If you have further itching tonight repeat benadryl, If it becomes more severe or you have shortness of breath, chest pain  Report to ED.   To help prevent nausea and vomiting after your treatment, we encourage you to take your nausea medication  If you develop nausea and vomiting, or diarrhea that is not controlled by your medication, call the clinic.  The clinic phone number is [phone] Office hours are Monday-Friday 8:30am-5:00pm.  BELOW ARE SYMPTOMS THAT SHOULD BE REPORTED IMMEDIATELY:  *FEVER GREATER THAN 101.0 F  *CHILLS WITH OR WITHOUT FEVER  NAUSEA AND VOMITING THAT IS NOT CONTROLLED WITH YOUR NAUSEA MEDICATION  *UNUSUAL SHORTNESS OF BREATH  *UNUSUAL BRUISING OR BLEEDING  TENDERNESS IN MOUTH AND THROAT WITH OR WITHOUT PRESENCE OF ULCERS  *URINARY PROBLEMS  *BOWEL PROBLEMS  UNUSUAL RASH Items with * indicate a potential emergency and should be followed up as soon as possible. If you have an emergency after office hours please contact your primary care physician or go to the nearest emergency department.  Please call the clinic during office hours if you have any questions or concerns.   You may also contact the Patient Navigator at [phone] you have any questions or need assistance in obtaining follow up care.

## 2015-05-26 NOTE — Patient Instructions (Addendum)
[place] at [facility] Discharge Instructions  RECOMMENDATIONS MADE BY THE CONSULTANT AND ANY TEST RESULTS WILL BE SENT TO YOUR REFERRING PHYSICIAN.     Exam and discussion by Dr [name] today  We will change therapy the next time you get chemotherapy.  Hildred Alamin will be in touch with you to teach you about this drug.  Camptosar is the new drug.  Chemotherapy today as long as counts are good.  Return Thursday to have pump removed.  Albuterol inhaler prescribed if you need it for you lungs  Zithromax for 10 days for infection, make sure you take all these  Dilaudid refilled   Return to see the doctor in 2 weeks   Please call the clinic if you have any questions or concerns     Thank you for choosing [place] at [facility] to provide your oncology and hematology care.  To afford each patient quality time with our provider, please arrive at least 15 minutes before your scheduled appointment time.   Beginning January 23rd 2017 lab work for the [facility] will be done in the  Main lab at [facility] on 1st floor. If you have a lab appointment with the [facility] please come in thru the  Main Entrance and check in at the main information desk  You need to re-schedule your appointment should you arrive 10 or more minutes late.  We strive to give you quality time with our providers, and arriving late affects you and other patients whose appointments are after yours.  Also, if you no show three or more times for appointments you may be dismissed from the clinic at the providers discretion.     Again, thank you for choosing [facility].  Our hope is that these requests will decrease the amount of time that you wait before being seen by our physicians.       _____________________________________________________________  Should you have questions after your visit to [facility], please contact our office at  [phone] between the hours of 8:30 a.m. and 4:30 p.m.  Voicemails left after 4:30 p.m. will not be returned until the following business day.  For prescription refill requests, have your pharmacy contact our office.       Irinotecan injection What is this medicine? IRINOTECAN (ir in oh TEE kan ) is a chemotherapy drug. It is used to treat colon and rectal cancer. This medicine may be used for other purposes; ask your health care provider or pharmacist if you have questions. What should I tell my health care provider before I take this medicine? They need to know if you have any of these conditions: -blood disorders -dehydration -diarrhea -infection (especially a virus infection such as chickenpox, cold sores, or herpes) -liver disease -low blood counts, like low white cell, platelet, or red cell counts -recent or ongoing radiation therapy -an unusual or allergic reaction to irinotecan, sorbitol, other chemotherapy, other medicines, foods, dyes, or preservatives -pregnant or trying to get pregnant -breast-feeding How should I use this medicine? This drug is given as an infusion into a vein. It is administered in a hospital or clinic by a specially trained health care professional. Talk to your pediatrician regarding the use of this medicine in children. Special care may be needed. Overdosage: If you think you have taken too much of this medicine contact a poison control center or emergency room at once. NOTE: This medicine is only for  you. Do not share this medicine with others. What if I miss a dose? It is important not to miss your dose. Call your doctor or health care professional if you are unable to keep an appointment. What may interact with this medicine? Do not take this medicine with any of the following medications: -atazanavir -certain medicines for fungal infections like itraconazole and ketoconazole -St. John's Wort This medicine may also interact with the following  medications: -dexamethasone -diuretics -laxatives -medicines for seizures like carbamazepine, mephobarbital, phenobarbital, phenytoin, primidone -medicines to increase blood counts like filgrastim, pegfilgrastim, sargramostim -prochlorperazine -vaccines This list may not describe all possible interactions. Give your health care provider a list of all the medicines, herbs, non-prescription drugs, or dietary supplements you use. Also tell them if you smoke, drink alcohol, or use illegal drugs. Some items may interact with your medicine. What should I watch for while using this medicine? Your condition will be monitored carefully while you are receiving this medicine. You will need important blood work done while you are taking this medicine. This drug may make you feel generally unwell. This is not uncommon, as chemotherapy can affect healthy cells as well as cancer cells. Report any side effects. Continue your course of treatment even though you feel ill unless your doctor tells you to stop. In some cases, you may be given additional medicines to help with side effects. Follow all directions for their use. You may get drowsy or dizzy. Do not drive, use machinery, or do anything that needs mental alertness until you know how this medicine affects you. Do not stand or sit up quickly, especially if you are an older patient. This reduces the risk of dizzy or fainting spells. Call your doctor or health care professional for advice if you get a fever, chills or sore throat, or other symptoms of a cold or flu. Do not treat yourself. This drug decreases your body's ability to fight infections. Try to avoid being around people who are sick. This medicine may increase your risk to bruise or bleed. Call your doctor or health care professional if you notice any unusual bleeding. Be careful brushing and flossing your teeth or using a toothpick because you may get an infection or bleed more easily. If you have any  dental work done, tell your dentist you are receiving this medicine. Avoid taking products that contain aspirin, acetaminophen, ibuprofen, naproxen, or ketoprofen unless instructed by your doctor. These medicines may hide a fever. Do not become pregnant while taking this medicine. Women should inform their doctor if they wish to become pregnant or think they might be pregnant. There is a potential for serious side effects to an unborn child. Talk to your health care professional or pharmacist for more information. Do not breast-feed an infant while taking this medicine. What side effects may I notice from receiving this medicine? Side effects that you should report to your doctor or health care professional as soon as possible: -allergic reactions like skin rash, itching or hives, swelling of the face, lips, or tongue -low blood counts - this medicine may decrease the number of white blood cells, red blood cells and platelets. You may be at increased risk for infections and bleeding. -signs of infection - fever or chills, cough, sore throat, pain or difficulty passing urine -signs of decreased platelets or bleeding - bruising, pinpoint red spots on the skin, black, tarry stools, blood in the urine -signs of decreased red blood cells - unusually weak or tired, fainting spells,  lightheadedness -breathing problems -chest pain -diarrhea -feeling faint or lightheaded, falls -flushing, runny nose, sweating during infusion -mouth sores or pain -pain, swelling, redness or irritation where injected -pain, swelling, warmth in the leg -pain, tingling, numbness in the hands or feet -problems with balance, talking, walking -stomach cramps, pain -trouble passing urine or change in the amount of urine -vomiting as to be unable to hold down drinks or food -yellowing of the eyes or skin Side effects that usually do not require medical attention (report to your doctor or health care professional if they  continue or are bothersome): -constipation -hair loss -headache -loss of appetite -nausea, vomiting -stomach upset This list may not describe all possible side effects. Call your doctor for medical advice about side effects. You may report side effects to FDA at 1-800-FDA-1088. Where should I keep my medicine? This drug is given in a hospital or clinic and will not be stored at home. NOTE: This sheet is a summary. It may not cover all possible information. If you have questions about this medicine, talk to your doctor, pharmacist, or health care provider.    2016, [employer]. (2012-10-15 16:29:32)

## 2015-05-27 LAB — CEA: CEA: 3.3 ng/mL (ref 0.0–4.7)

## 2015-05-28 ENCOUNTER — Encounter (HOSPITAL_BASED_OUTPATIENT_CLINIC_OR_DEPARTMENT_OTHER): Payer: Medicaid Other

## 2015-05-28 VITALS — BP 146/87 | HR 88 | Temp 98.5°F | Resp 20

## 2015-05-28 DIAGNOSIS — Z452 Encounter for adjustment and management of vascular access device: Secondary | ICD-10-CM | POA: Diagnosis present

## 2015-05-28 DIAGNOSIS — C7800 Secondary malignant neoplasm of unspecified lung: Secondary | ICD-10-CM | POA: Diagnosis not present

## 2015-05-28 DIAGNOSIS — C20 Malignant neoplasm of rectum: Secondary | ICD-10-CM | POA: Diagnosis not present

## 2015-05-28 MED ORDER — SODIUM CHLORIDE 0.9 % IJ SOLN
10.0000 mL | INTRAMUSCULAR | Status: DC | PRN
  Administered 2015-05-28: 10 mL
  Filled 2015-05-28: qty 10

## 2015-05-28 MED ORDER — HEPARIN SOD (PORK) LOCK FLUSH 100 UNIT/ML IV SOLN
500.0000 [IU] | Freq: Once | INTRAVENOUS | Status: AC | PRN
  Administered 2015-05-28: 500 [IU]

## 2015-05-28 MED ORDER — HEPARIN SOD (PORK) LOCK FLUSH 100 UNIT/ML IV SOLN
INTRAVENOUS | Status: AC
  Filled 2015-05-28: qty 5

## 2015-05-28 NOTE — Progress Notes (Signed)
Otis Brace presented for Portacath access and flush. Proper placement of portacath confirmed by CXR. Portacath located right chest wall accessed with  H 20 needle. No blood return but flushed well without difficulty or discomfort.  No redness or swelling to area.  Home infusion pump removed.   Portacath flushed with 16m NS and 500U/561mHeparin and needle removed intact. Procedure without incident. Patient tolerated procedure well.

## 2015-05-28 NOTE — Patient Instructions (Signed)
[place] at [facility] Discharge Instructions  RECOMMENDATIONS MADE BY THE CONSULTANT AND ANY TEST RESULTS WILL BE SENT TO YOUR REFERRING PHYSICIAN.  Home infusion pump removed today.   Please call us if you start to have a fever or if you have any discolored sputum when you cough.    Thank you for choosing [place] at [facility] to provide your oncology and hematology care.  To afford each patient quality time with our provider, please arrive at least 15 minutes before your scheduled appointment time.   Beginning January 23rd 2017 lab work for the [facility] will be done in the  Main lab at [facility] on 1st floor. If you have a lab appointment with the [place] please come in thru the  Main Entrance and check in at the main information desk  You need to re-schedule your appointment should you arrive 10 or more minutes late.  We strive to give you quality time with our providers, and arriving late affects you and other patients whose appointments are after yours.  Also, if you no show three or more times for appointments you may be dismissed from the clinic at the providers discretion.     Again, thank you for choosing [facility].  Our hope is that these requests will decrease the amount of time that you wait before being seen by our physicians.       _____________________________________________________________  Should you have questions after your visit to [facility], please contact our office at [phone] between the hours of 8:30 a.m. and 4:30 p.m.  Voicemails left after 4:30 p.m. will not be returned until the following business day.  For prescription refill requests, have your pharmacy contact our office.

## 2015-06-07 ENCOUNTER — Other Ambulatory Visit (HOSPITAL_COMMUNITY): Payer: Self-pay | Admitting: Hematology & Oncology

## 2015-06-08 NOTE — Patient Instructions (Addendum)
[place]   CHEMOTHERAPY INSTRUCTIONS  Irinotecan - diarrhea, bone marrow suppression, hair loss, abdominal cramping. This drug can cause early and late diarrhea. Early diarrhea can occur within 24 hours of administration. We recommend Imodium for diarrhea, you take Imodium 2 tablets @ the onset of diarrhea, and then 1 tablet every 2 hours until 12 hours have passed without a loose stool. If diarrhea is occurring at night time you may take 2 tablets @ bedtime and then 2 tablets every 4 hours until morning. If diarrhea recurs repeat. Call [facility] and let us know that you are having diarrhea and whether or not the Imodium is working. Diarrhea can lead to dehydration!!!  (this takes 90 minutes to infuse)  Leucovorin - this is a medication that is not chemo but given with chemo. This med "rescues" the healthy cells before we administer the drug 5FU. This makes the 5FU work better. (takes 2 hours to infuse- this goes in @ the same time as the Irinotecan chemo)   5FU: bone marrow suppression (low white blood cells - wbcs fight infection, low red blood cells - rbcs make up your blood, low platelets - this is what makes your blood clot, nausea/vomiting, diarrhea, mouth sores, hair loss, dry skin, ocular toxicities (increased tear production, sensitivity to light). You must wear sunscreen/sunglasses. Cover your skin when out in sunlight. You will get burned very easily. The nurse will sit @ your bedside and push this medication in via a syringe. This will take 10 minutes. Then she will connect you to an ambulatory pump with this medication attached. You will wear the pump for 46 hours. The 5FU chemo will infuse for 46 hours total. Then the pump will be removed.   Avastin - this medication is considered an anti-angiogenic therapy. Avastin is thought to work by causing the blood vessels to shrink away from the tumor, blocking the supply of oxygen and nutrients that the  tumor needs to grow. Avastin may also cause the existing blood vessels to change in ways that help the chemotherapy reach the tumor more effectively. Avastin may also work to interfere with the growth of new blood vessels, causing the tumor to starve. Side Effects: Nosebleeds, High Blood Pressure, Protein in Urine, & diarrhea.    POTENTIAL SIDE EFFECTS OF TREATMENT: Increased Susceptibility to Infection, Vomiting, Constipation, Hair Thinning, Changes in Character of Skin and Nails (brittleness, dryness,etc.), Bone Marrow Suppression, Abdominal Cramping, Complete Hair Loss, Nausea, Diarrhea, Sun Sensitivity and Mouth Sores    EDUCATIONAL MATERIALS GIVEN AND REVIEWED: Specific Instructions Sheets: Irinotecan   SELF CARE ACTIVITIES WHILE ON CHEMOTHERAPY: Increase your fluid intake 48 hours prior to treatment and drink at least 2 quarts per day after treatment., No alcohol intake., No aspirin or other medications unless approved by your oncologist., Eat foods that are light and easy to digest., Eat foods at cold or room temperature., No fried, fatty, or spicy foods immediately before or after treatment., Have teeth cleaned professionally before starting treatment. Keep dentures and partial plates clean., Use soft toothbrush and do not use mouthwashes that contain alcohol. Biotene is a good mouthwash that is available at most pharmacies or may be ordered by calling [phone]., Use warm salt water gargles (1 teaspoon salt per 1 quart warm water) before and after meals and at bedtime. Or you may rinse with 2 tablespoons of three -percent hydrogen peroxide mixed in eight ounces of water., Always use sunscreen with SPF Nancy Fetter Protection Factor)  of 30 or higher., Use your nausea medication as directed to prevent nausea., Use your stool softener or laxative as directed to prevent constipation. and Use your anti-diarrheal medication as directed to stop diarrhea.  Please wash your hands for at least 30 seconds  using warm soapy water. Handwashing is the #1 way to prevent the spread of germs. Stay away from sick people or people who are getting over a cold. If you develop respiratory systems such as green/yellow mucus production or productive cough or persistent cough let us know and we will see if you need an antibiotic. It is a good idea to keep a pair of gloves on when going into grocery stores/[employer] to decrease your risk of coming into contact with germs on the carts, etc. Carry alcohol hand gel with you at all times and use it frequently if out in public. All foods need to be cooked thoroughly. No raw foods. No medium or undercooked meats, eggs. If your food is cooked medium well, it does not need to be hot pink or saturated with bloody liquid at all. Vegetables and fruits need to be washed/rinsed under the faucet with a dish detergent before being consumed. You can eat raw fruits and vegetables unless we tell you otherwise but it would be best if you cooked them or bought frozen. Do not eat off of salad bars or hot bars unless you really trust the cleanliness of the restaurant. If you need dental work, please let Dr. [name] know before you go for your appointment so that we can coordinate the best possible time for you in regards to your chemo regimen. You need to also let your dentist know that you are actively taking chemo. We may need to do labs prior to your dental appointment. We also want your bowels moving at least every other day. If this is not happening, we need to know so that we can get you on a bowel regimen to help you go. If you are going to have sex, wear a condom. This is to protect your partner from potential chemotherapy exposure. You will need to wear a condom for up to 1 month after chemo is completed.     MEDICATIONS: You have been given prescriptions for the following medications:  Zofran '8mg'$  tablet. Take 1 tablet every 8 hours as needed for nausea/vomiting. (#1 to take for  nausea/vomiting, but can constipate)  Compazine '10mg'$  tablet. Take 1 tablet every 6 hours as needed for nausea/vomiting. (#2 to take for nausea/vomiting, can make you sleepy)  EMLA cream. Apply a quarter size amount to port site 1 hour prior to chemo. Do not rub in. Cover with plastic wrap.    Over-the-Counter Meds:  Miralax 1 capful in 8 oz of fluid daily. May increase to two times a day if needed. This is a stool softener. If this doesn't work proceed you can add:  Senokot S - start with 1 tablet two times a day and increase to 4 tablets two times a day if needed. (total of 8 tablets in a 24 hour period). This is a stimulant laxative.   Call us if this does not help your bowels move.   Imodium '2mg'$  capsule. Take 2 capsules after the 1st loose stool and then 1 capsule every 2 hours until you go a total of 12 hours without having a loose stool. Call the [facility] if loose stools continue. For diarrhea that occurs at night, take 2 capsules @ bedtime and then 2  capsules every 4 hours until morning. Call the [facility].   Over-the-Counter Meds:  Miralax 1 capful in 8 oz of fluid daily. May increase to two times a day if needed. This is a stool softener. If this doesn't work proceed you can add:  Senokot S - start with 1 tablet two times a day and increase to 4 tablets two times a day if needed. (total of 8 tablets in a 24 hour period). This is a stimulant laxative.   Call us if this does not help your bowels move.   Imodium '2mg'$  capsule. Take 2 capsules after the 1st loose stool and then 1 capsule every 2 hours until you go a total of 12 hours without having a loose stool. If it is night time and you have diarrhea, take 2 capsules at bedtime and then 2 capsules every 4 hours until the morning. Call the [facility] if loose stools continue.    SYMPTOMS TO REPORT AS SOON AS POSSIBLE AFTER TREATMENT:  FEVER GREATER THAN 100.5 F  CHILLS WITH OR WITHOUT FEVER  NAUSEA AND  VOMITING THAT IS NOT CONTROLLED WITH YOUR NAUSEA MEDICATION  UNUSUAL SHORTNESS OF BREATH  UNUSUAL BRUISING OR BLEEDING  TENDERNESS IN MOUTH AND THROAT WITH OR WITHOUT PRESENCE OF ULCERS  URINARY PROBLEMS  BOWEL PROBLEMS  UNUSUAL RASH   Wear comfortable clothing and clothing appropriate for easy access to any Portacath or PICC line. Let us know if there is anything that we can do to make your therapy better!      I have been informed and understand all of the instructions given to me and have received a copy. I have been instructed to call the clinic [phone] or my family physician as soon as possible for continued medical care, if indicated. I do not have any more questions at this time but understand that I may call the [facility] or the Patient Navigator at [phone] during office hours should I have questions or need assistance in obtaining follow-up care.       Irinotecan injection What is this medicine? IRINOTECAN (ir in oh TEE kan ) is a chemotherapy drug. It is used to treat colon and rectal cancer. This medicine may be used for other purposes; ask your health care provider or pharmacist if you have questions. What should I tell my health care provider before I take this medicine? They need to know if you have any of these conditions: -blood disorders -dehydration -diarrhea -infection (especially a virus infection such as chickenpox, cold sores, or herpes) -liver disease -low blood counts, like low white cell, platelet, or red cell counts -recent or ongoing radiation therapy -an unusual or allergic reaction to irinotecan, sorbitol, other chemotherapy, other medicines, foods, dyes, or preservatives -pregnant or trying to get pregnant -breast-feeding How should I use this medicine? This drug is given as an infusion into a vein. It is administered in a hospital or clinic by a specially trained health care professional. Talk to your pediatrician  regarding the use of this medicine in children. Special care may be needed. Overdosage: If you think you have taken too much of this medicine contact a poison control center or emergency room at once. NOTE: This medicine is only for you. Do not share this medicine with others. What if I miss a dose? It is important not to miss your dose. Call your doctor or health care professional if you are unable to keep an appointment. What may interact with this  medicine? Do not take this medicine with any of the following medications: -atazanavir -certain medicines for fungal infections like itraconazole and ketoconazole -St. John's Wort This medicine may also interact with the following medications: -dexamethasone -diuretics -laxatives -medicines for seizures like carbamazepine, mephobarbital, phenobarbital, phenytoin, primidone -medicines to increase blood counts like filgrastim, pegfilgrastim, sargramostim -prochlorperazine -vaccines This list may not describe all possible interactions. Give your health care provider a list of all the medicines, herbs, non-prescription drugs, or dietary supplements you use. Also tell them if you smoke, drink alcohol, or use illegal drugs. Some items may interact with your medicine. What should I watch for while using this medicine? Your condition will be monitored carefully while you are receiving this medicine. You will need important blood work done while you are taking this medicine. This drug may make you feel generally unwell. This is not uncommon, as chemotherapy can affect healthy cells as well as cancer cells. Report any side effects. Continue your course of treatment even though you feel ill unless your doctor tells you to stop. In some cases, you may be given additional medicines to help with side effects. Follow all directions for their use. You may get drowsy or dizzy. Do not drive, use machinery, or do anything that needs mental alertness until you know  how this medicine affects you. Do not stand or sit up quickly, especially if you are an older patient. This reduces the risk of dizzy or fainting spells. Call your doctor or health care professional for advice if you get a fever, chills or sore throat, or other symptoms of a cold or flu. Do not treat yourself. This drug decreases your body's ability to fight infections. Try to avoid being around people who are sick. This medicine may increase your risk to bruise or bleed. Call your doctor or health care professional if you notice any unusual bleeding. Be careful brushing and flossing your teeth or using a toothpick because you may get an infection or bleed more easily. If you have any dental work done, tell your dentist you are receiving this medicine. Avoid taking products that contain aspirin, acetaminophen, ibuprofen, naproxen, or ketoprofen unless instructed by your doctor. These medicines may hide a fever. Do not become pregnant while taking this medicine. Women should inform their doctor if they wish to become pregnant or think they might be pregnant. There is a potential for serious side effects to an unborn child. Talk to your health care professional or pharmacist for more information. Do not breast-feed an infant while taking this medicine. What side effects may I notice from receiving this medicine? Side effects that you should report to your doctor or health care professional as soon as possible: -allergic reactions like skin rash, itching or hives, swelling of the face, lips, or tongue -low blood counts - this medicine may decrease the number of white blood cells, red blood cells and platelets. You may be at increased risk for infections and bleeding. -signs of infection - fever or chills, cough, sore throat, pain or difficulty passing urine -signs of decreased platelets or bleeding - bruising, pinpoint red spots on the skin, black, tarry stools, blood in the urine -signs of decreased red  blood cells - unusually weak or tired, fainting spells, lightheadedness -breathing problems -chest pain -diarrhea -feeling faint or lightheaded, falls -flushing, runny nose, sweating during infusion -mouth sores or pain -pain, swelling, redness or irritation where injected -pain, swelling, warmth in the leg -pain, tingling, numbness in the hands or feet -  problems with balance, talking, walking -stomach cramps, pain -trouble passing urine or change in the amount of urine -vomiting as to be unable to hold down drinks or food -yellowing of the eyes or skin Side effects that usually do not require medical attention (report to your doctor or health care professional if they continue or are bothersome): -constipation -hair loss -headache -loss of appetite -nausea, vomiting -stomach upset This list may not describe all possible side effects. Call your doctor for medical advice about side effects. You may report side effects to FDA at 1-800-FDA-1088. Where should I keep my medicine? This drug is given in a hospital or clinic and will not be stored at home. NOTE: This sheet is a summary. It may not cover all possible information. If you have questions about this medicine, talk to your doctor, pharmacist, or health care provider.    2016, [employer]. (2012-10-15 16:29:32)

## 2015-06-09 ENCOUNTER — Encounter (HOSPITAL_COMMUNITY): Payer: Self-pay | Admitting: Hematology & Oncology

## 2015-06-09 ENCOUNTER — Ambulatory Visit (HOSPITAL_COMMUNITY)
Admission: RE | Admit: 2015-06-09 | Discharge: 2015-06-09 | Disposition: A | Discharge status: Home / Self Care | Payer: Medicaid Other | Source: Ambulatory Visit | Attending: Hematology & Oncology | Admitting: Hematology & Oncology

## 2015-06-09 ENCOUNTER — Encounter (HOSPITAL_COMMUNITY): Payer: Medicaid Other | Attending: Hematology & Oncology

## 2015-06-09 ENCOUNTER — Encounter: Payer: Self-pay | Admitting: *Deleted

## 2015-06-09 ENCOUNTER — Encounter (HOSPITAL_COMMUNITY): Payer: Medicaid Other

## 2015-06-09 ENCOUNTER — Encounter (HOSPITAL_BASED_OUTPATIENT_CLINIC_OR_DEPARTMENT_OTHER): Payer: Medicaid Other | Admitting: Hematology & Oncology

## 2015-06-09 VITALS — BP 161/88 | HR 77 | Temp 98.0°F | Resp 20 | Wt 172.0 lb

## 2015-06-09 DIAGNOSIS — J40 Bronchitis, not specified as acute or chronic: Secondary | ICD-10-CM

## 2015-06-09 DIAGNOSIS — K6289 Other specified diseases of anus and rectum: Secondary | ICD-10-CM

## 2015-06-09 DIAGNOSIS — R918 Other nonspecific abnormal finding of lung field: Secondary | ICD-10-CM | POA: Insufficient documentation

## 2015-06-09 DIAGNOSIS — F419 Anxiety disorder, unspecified: Secondary | ICD-10-CM | POA: Diagnosis not present

## 2015-06-09 DIAGNOSIS — Z720 Tobacco use: Secondary | ICD-10-CM

## 2015-06-09 DIAGNOSIS — C7800 Secondary malignant neoplasm of unspecified lung: Secondary | ICD-10-CM | POA: Diagnosis not present

## 2015-06-09 DIAGNOSIS — C20 Malignant neoplasm of rectum: Secondary | ICD-10-CM

## 2015-06-09 DIAGNOSIS — R059 Cough, unspecified: Secondary | ICD-10-CM

## 2015-06-09 DIAGNOSIS — R05 Cough: Secondary | ICD-10-CM

## 2015-06-09 DIAGNOSIS — Z95828 Presence of other vascular implants and grafts: Secondary | ICD-10-CM

## 2015-06-09 DIAGNOSIS — R0602 Shortness of breath: Secondary | ICD-10-CM | POA: Diagnosis not present

## 2015-06-09 LAB — COMPREHENSIVE METABOLIC PANEL
ALBUMIN: 3.5 g/dL (ref 3.5–5.0)
ALK PHOS: 89 U/L (ref 38–126)
ALT: 12 U/L — ABNORMAL LOW (ref 17–63)
AST: 17 U/L (ref 15–41)
Anion gap: 7 (ref 5–15)
BILIRUBIN TOTAL: 0.4 mg/dL (ref 0.3–1.2)
CALCIUM: 8.8 mg/dL — AB (ref 8.9–10.3)
CO2: 25 mmol/L (ref 22–32)
Chloride: 108 mmol/L (ref 101–111)
Creatinine, Ser: 0.71 mg/dL (ref 0.61–1.24)
GFR calc Af Amer: >60 mL/min (ref >60)
GLUCOSE: 104 mg/dL — AB (ref 65–99)
Potassium: 3.5 mmol/L (ref 3.5–5.1)
Sodium: 140 mmol/L (ref 135–145)
TOTAL PROTEIN: 7.2 g/dL (ref 6.5–8.1)

## 2015-06-09 LAB — CBC WITH DIFFERENTIAL/PLATELET
BASOS ABS: 0 10*3/uL (ref 0.0–0.1)
BASOS PCT: 0 %
Eosinophils Absolute: 0.1 10*3/uL (ref 0.0–0.7)
Eosinophils Relative: 2 %
HEMATOCRIT: 42.8 % (ref 39.0–52.0)
HEMOGLOBIN: 14 g/dL (ref 13.0–17.0)
LYMPHS PCT: 11 %
Lymphs Abs: 0.7 10*3/uL (ref 0.7–4.0)
MCH: 31.7 pg (ref 26.0–34.0)
MCHC: 32.7 g/dL (ref 30.0–36.0)
MCV: 97.1 fL (ref 78.0–100.0)
MONOS PCT: 13 %
Monocytes Absolute: 0.9 10*3/uL (ref 0.1–1.0)
NEUTROS ABS: 5 10*3/uL (ref 1.7–7.7)
NEUTROS PCT: 74 %
Platelets: 238 10*3/uL (ref 150–400)
RBC: 4.41 MIL/uL (ref 4.22–5.81)
RDW: 16.9 % — ABNORMAL HIGH (ref 11.5–15.5)
WBC: 6.7 10*3/uL (ref 4.0–10.5)

## 2015-06-09 MED ORDER — HEPARIN SOD (PORK) LOCK FLUSH 100 UNIT/ML IV SOLN
500.0000 [IU] | Freq: Once | INTRAVENOUS | Status: AC
  Administered 2015-06-09: 500 [IU] via INTRAVENOUS

## 2015-06-09 MED ORDER — SODIUM CHLORIDE 0.9% FLUSH
10.0000 mL | Freq: Once | INTRAVENOUS | Status: AC
  Administered 2015-06-09: 10 mL via INTRAVENOUS

## 2015-06-09 MED ORDER — HYDROMORPHONE HCL 4 MG PO TABS: 4.0000 mg | ORAL_TABLET | ORAL | Status: DC | PRN

## 2015-06-09 MED ORDER — LEVOFLOXACIN 500 MG PO TABS: 500.0000 mg | ORAL_TABLET | Freq: Every day | ORAL | Status: DC

## 2015-06-09 MED ORDER — HEPARIN SOD (PORK) LOCK FLUSH 100 UNIT/ML IV SOLN
INTRAVENOUS | Status: AC
  Filled 2015-06-09: qty 5

## 2015-06-09 NOTE — Progress Notes (Signed)
[facility] Clinical Social Work  Clinical Social Work was referred by [facility] rounding for re-assessment of psychosocial needs due to past concerns. Clinical Social Worker met with patient briefly as pt was checking out. Pt reports to not be feeling well and was visibly uncomfortable. CSW provided supportive listening. Pt coping ok and agrees to reach out to CSW as needs arise. CSW will still continue to follow pt.   Clinical Social Work interventions: Supportive listening    [place], [place] Tuesdays   [phone]

## 2015-06-09 NOTE — Progress Notes (Signed)
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    09/25/2014 Imaging CT abd/pelvis- Indeterminate nodules within the bilateral lung bases with dominant approximately 1.7 cm centrally necrotic nodule within the right lower lobe...   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   10/03/2014 Tumor Marker CEA NOT ELEVATED   10/09/2014 Imaging CT Chest- Bilateral pulmonary nodules of varying size are most consistent with pulmonary metastasis. There are approximately 6 lesions in each lung.   10/17/2014 Pathology Results Rectum, biopsy - INVASIVE ADENOCARCINOMA   10/29/2014 Pathology Results Diagnosis Lung, needle/core biopsy(ies), right lower lobe - METASTATIC ADENOCARCINOMA   11/04/2014 - 12/18/2014 Radiation Therapy Dr. Lisbeth Renshaw   11/05/2014 -  Chemotherapy FOLFOX + Avastin   02/11/2015 Surgery Colostomy formation by Dr. Arnoldo Morale.         HISTORY OF PRESENTING ILLNESS:  Andrew Kline 50 y.o. male is here because of stage IV rectal cancer.  He is here alone today. He goes by [name]. [name]   He says that "the cough has been so strong ... " and trails off. He remarks that, after the zithromax last time, his symptoms stayed the same. He says he's "doing an inhaler" but it doesn't seem to be working. Mr. Hengel remarks that he has had bronchitis before, but that this doesn't feel like bronchitis. He says "I can lay back and sweat my ass off," commenting on a symptom of his present  illness.  When asked about his smoking habits, he reports that he's not smoking much. He also notes that he "hasn't had much of a voice since he lost it last week."  He also reports that his nose is running, stating "I could sit in a chair and drop my hair and my nose would just start running." "I drip right out."  He says that when he coughs, his rectal pain is especially terrible. He confirms it wouldn't be so bad if he wasn't coughing.  MEDICAL HISTORY:  Past Medical History  Diagnosis Date  . Rectal pain   . Arthritis   . Renal cell cancer ([place])     2012.   [name] Rectal cancer ([place])   . Shortness of breath dyspnea   . Anxiety   . Depression   . Kidney stone 03/22/15    left    SURGICAL HISTORY: Past Surgical History  Procedure Laterality Date  . Hypospadias correction    . Laparoscopic partial nephrectomy  2012    right side  . Knee surgery Left     arthroscopy  . Flexible sigmoidoscopy N/A 09/30/2014    Procedure: FLEXIBLE SIGMOIDOSCOPY;  Surgeon: Danie Binder, MD;  Location: [facility] ORS;  Service: Endoscopy;  Laterality: N/A;  . Biopsy N/A 09/30/2014    Procedure: BIOPSY;  Surgeon: Danie Binder, MD;  Location: [facility] ORS;  Service: Endoscopy;  Laterality: N/A;  Anal Canal  . Flexible sigmoidoscopy N/A 10/17/2014    Procedure: FLEXIBLE SIGMOIDOSCOPY;  Surgeon: Danie Binder, MD;  Location: [facility] ENDO SUITE;  Service: Endoscopy;  Laterality: N/A;  1325  . Lung biopsy Right   . Colostomy N/A 02/11/2015    Procedure: COLOSTOMY;  Surgeon: Aviva Signs, MD;  Location: [facility] ORS;  Service: General;  Laterality: N/A;    SOCIAL HISTORY: Social History   Social History  . Marital Status: Divorced    Spouse Name: N/A  . Number of Children: 2  . Years of Education: N/A   Occupational History  . retail    Social History Main Topics  . Smoking status: Current Every Day Smoker -- 0.50 packs/day for 30 years    Types: Cigarettes  . Smokeless tobacco: Not on file     Comment: a little  over a pack daily  . Alcohol Use: 0.0 oz/week    0 Standard drinks or equivalent per week     Comment: Occ  . Drug Use: No  . Sexual Activity: Yes    Birth Control/ Protection: None   Other Topics Concern  . Not on file   Social History Narrative  He is from [place], [place]. Moved here in March. Smoker, 1 ppd. Was 1.5 ppd. ETOH, none. 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 He is divorced. Lives with fiance. 2 children. 68 and 77 yo. No grandchildren.  FAMILY HISTORY: Family History  Problem Relation Age of Onset  . Lung cancer Maternal Grandfather   . Colon cancer Maternal Uncle     age 67  . Diabetes Other   . Healthy Mother    indicated that his mother is alive. He indicated that his father is alive.   Mother living, 72 yo. Still working as a Audiological scientist. Father living, 82-70 yo. 1 brother, 1 sister, 1 step sister. Maternal uncle has colon cancer. He is married to a GI nurse. So he has people to go to with questions and is informed on what is happening. History of diabetes in the family. No history of heart disease in the family.  ALLERGIES:  is allergic to oxaliplatin.  MEDICATIONS:  Current Outpatient Prescriptions  Medication Sig Dispense Refill  . albuterol (PROVENTIL HFA;VENTOLIN HFA) 108 (90 Base) MCG/ACT inhaler Inhale 2 puffs into the lungs every 6 (six) hours as needed for wheezing or shortness of breath. 1 Inhaler 2  . amitriptyline (ELAVIL) 50 MG tablet Take 1 tablet (50 mg total) by mouth at bedtime. 30 tablet 2  . Bevacizumab (AVASTIN IV) Inject into the vein every 14 (fourteen) days. To start November 19, 2014    . citalopram (CELEXA) 40 MG tablet TAKE 1/2 TABLET BY MOUTH FOR 5 DAYS  THEN ONE TABLET DAILY THEREAFTER. (Patient taking differently: Take 40 mg by mouth daily. ) 30 tablet 1  . dextrose 5 % SOLN 1,000 mL with fluorouracil 5 GM/100ML SOLN Inject into  the vein every 14 (fourteen) days. To start November 05, 2014. To infuse over 46 hours via pump.    . DULoxetine (CYMBALTA) 30 MG capsule Take one capsule daily for 5 days then 2 daily thereafter (Patient taking differently: Take 30 mg by mouth daily. ) 60 capsule 3  . fentaNYL (DURAGESIC - DOSED MCG/HR) 75 MCG/HR Place 2 patches (150 mcg total) onto the skin every 3 (three) days. 20 patch 0  . HYDROmorphone (DILAUDID) 4 MG tablet Take 1 tablet (4 mg total) by mouth every 4 (four) hours as needed for severe pain. 60 tablet 0  . leucovorin 50 MG injection Inject into  the vein every 14 (fourteen) days. To start November 05, 2014    . lidocaine-hydrocortisone (ANAMANTLE) 3-1 % KIT Place 1 application rectally 2 (two) times daily. For two weeks, alternate with Rectiv. 28 each 1  . lidocaine-prilocaine (EMLA) cream Apply a quarter size amount to port site 1 hour prior to chemo. Do not rub in. Cover with plastic wrap. 30 g 3  . nitroGLYCERIN (NITROGLYN) 2 % ointment Apply 0.5 inches topically daily. Anal area    . pregabalin (LYRICA) 150 MG capsule Take 1 capsule (150 mg total) by mouth 2 (two) times daily. 60 capsule 3  . azithromycin (ZITHROMAX Z-PAK) 250 MG tablet For 10 days (Patient not taking: Reported on 06/09/2015) 11 each 0  . docusate sodium (COLACE) 100 MG capsule Take 1 capsule (100 mg total) by mouth every 12 (twelve) hours. (Patient not taking: Reported on 06/09/2015) 60 capsule 0  . IRINOTECAN HCL IV Inject into the vein. Reported on 06/09/2015    . ondansetron (ZOFRAN ODT) 4 MG disintegrating tablet 61m ODT q4 hours prn nausea/vomit (Patient not taking: Reported on 03/31/2015) 20 tablet 0  . ondansetron (ZOFRAN) 4 mg TABS tablet Take 4 tablets by mouth every 8 (eight) hours as needed. (Patient not taking: Reported on 06/09/2015) 4 tablet 0  . potassium chloride SA (K-DUR,KLOR-CON) 20 MEQ tablet Take 1 tablet (20 mEq total) by mouth 2 (two) times daily. (Patient not taking: Reported on 03/31/2015) 30 tablet 0    . prochlorperazine (COMPAZINE) 10 MG tablet Take 1 tablet (10 mg total) by mouth every 6 (six) hours as needed for nausea or vomiting. (Patient not taking: Reported on 06/09/2015) 60 tablet 0   No current facility-administered medications for this visit.   Facility-Administered Medications Ordered in Other Visits  Medication Dose Route Frequency Provider Last Rate Last Dose  . 0.9 %  sodium chloride infusion   Intravenous Continuous SPatrici Ranks MD        Review of Systems  Constitutional: Positive for malaise/fatigue, improving.  HENT: Positive for runny nose. Respiratory: Positive for congestion, sputum production, and cough. Gastrointestinal: Positive for rectal pain. Negative for blood in stool, diarrhea. Occasional rectal discharge  Genitourinary: Negative Psychiatric/Behavioral: Positive for ANXIETY.  14 point ROS was done and is otherwise as detailed above or in HPI   PHYSICAL EXAMINATION:  ECOG PERFORMANCE STATUS: 1 - Symptomatic but completely ambulatory Vitals - 1 value per visit 12/67/1245 SYSTOLIC 1809 DIASTOLIC 80  Pulse 95  Temperature 97.8  Respirations 20  Weight (lb) 178  Height   BMI 28.74    Physical Exam  Constitutional: He is oriented to person, place, and time and well-developed, well-nourished, and in no distress.  HENT:  Head: Normocephalic and atraumatic.  Nose: Nose normal.  Mouth/Throat: Oropharynx is clear and moist. No oropharyngeal exudate.  Dentures on top.  Eyes: Conjunctivae and EOM are normal. Pupils are equal, round, and reactive to light. Right eye exhibits no discharge. Left eye exhibits no discharge. No scleral icterus.  Neck: Normal range of motion. Neck supple. No tracheal deviation present. No thyromegaly present.  Cardiovascular: Normal rate, regular rhythm and normal heart sounds.  Exam reveals no gallop and no friction rub.   No murmur heard. Pulmonary/Chest:   Occasional coarse rhonchi and wheezing  throughout. Abdominal: Soft. Bowel sounds are normal. He exhibits no distension and no mass. There is no tenderness. There is no rebound and no guarding. Ostomy is c/d/i  Genitourinary:   Musculoskeletal: Normal range of motion.  He exhibits no edema.  Lymphadenopathy:    He has no cervical adenopathy.  Neurological: He is alert and oriented to person, place, and time. He has normal reflexes. No cranial nerve deficit. Gait normal. Coordination normal.  Skin: Skin is warm and dry. No rash noted.  Psychiatric: Mood, memory, affect and judgment normal.  Nursing note and vitals reviewed.   LABORATORY DATA:  I have reviewed the data as listed Lab Results  Component Value Date   WBC 6.7 06/09/2015   HGB 14.0 06/09/2015   HCT 42.8 06/09/2015   MCV 97.1 06/09/2015   PLT 238 06/09/2015   CMP     Component Value Date/Time   NA 135 05/26/2015 0844   K 3.7 05/26/2015 0844   CL 101 05/26/2015 0844   CO2 25 05/26/2015 0844   GLUCOSE 120* 05/26/2015 0844   BUN 6 05/26/2015 0844   CREATININE 0.59* 05/26/2015 0844   CALCIUM 8.6* 05/26/2015 0844   PROT 7.1 05/26/2015 0844   ALBUMIN 3.5 05/26/2015 0844   AST 17 05/26/2015 0844   ALT 14* 05/26/2015 0844   ALKPHOS 110 05/26/2015 0844   BILITOT 0.4 05/26/2015 0844   GFRNONAA >60 05/26/2015 0844   GFRAA >60 05/26/2015 0844   RADIOLOGY: I have personally reviewed the radiological images as listed and agreed with the findings in the report. CLINICAL DATA: Rectal cancer with metastatic disease to lung. Cough and short of breath  EXAM: CHEST 2 VIEW  COMPARISON: Chest two views 10/31/2014. Chest CT 05/20/2015  FINDINGS: Port-A-Cath tip in the SVC unchanged. Heart size and vascularity normal. Negative for pneumonia or effusion.  Small lung nodules are best seen on the recent CT scan. Cavitary nodule right posterior lung base unchanged from the recent CT.  IMPRESSION: Cavitary nodule right lower lobe as noted on CT. Other small  nodules best seen on CT.   Electronically Signed  By: Franchot Gallo M.D.  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 Anal carcinoma, adenocarcinoma Pulmonary metastases Good PS History of partial nephrectomy 2012 at [facility] in [place] FOLFOX/AVASTIN XRT for palliation Colostomy for palliation of poor bowel control Lyrica for pain, with marked improvement in QOL and pain scores Contact Dermatitis from Fentanyl patch Weight loss  Given his ongoing pulmonary and URI complaints today we will delay his next cycle of chemotherapy by 2 weeks. I have obtained a CXR. There is no evidence of pneumonia. I have called in levaquin as he has already been through a course of zithromax. I advised him he has bronchitis. I anticipate this will resolve over the upcoming week but if he is not better he is to call by the weeks end.   In regards to his rectal discomfort, He is on lyrica, fentanyl and dilaudid. CT imaging did not show significant pelvic disease. I will refer him back to Dr. Arnoldo Morale for assessment.   Orders Placed This Encounter  Procedures  . DG Chest 2 View    Standing Status: Future     Number of Occurrences:      Standing Expiration Date: 06/08/2016    Order Specific Question:  Reason for Exam (SYMPTOM  OR DIAGNOSIS REQUIRED)    Answer:  persistent cough, stage IV CRC, SOB    Order Specific Question:  Preferred imaging location?    Answer:  [facility]   This note was electronically signed.   This document serves as a record of services personally performed by Ancil Linsey,  MD. It was created on her behalf by Toni Amend, a trained medical scribe. The creation of this record is based on the scribe's personal observations and the provider's statements to them. This document has been checked and approved by the attending provider.  I have reviewed the above documentation for accuracy and completeness, and I agree with  the above.   Kelby Fam. [name], MD

## 2015-06-09 NOTE — Progress Notes (Signed)
Otis Brace presented for Portacath access and flush. Proper placement of portacath confirmed by CXR. Portacath located right chest wall accessed with  H 20 needle. Good blood return present. Portacath flushed with 77m NS and 500U/530mHeparin and needle removed intact. Procedure without incident. Patient tolerated procedure well.

## 2015-06-09 NOTE — Patient Instructions (Addendum)
[place] at [facility] Discharge Instructions  RECOMMENDATIONS MADE BY THE CONSULTANT AND ANY TEST RESULTS WILL BE SENT TO YOUR REFERRING PHYSICIAN.   Exam and discussion by Dr [name] today Defer chemotherapy for 2 weeks Chest x-ray when you leave today, come back up here for after chest x-ray Return to see the doctor in 2 weeks  Please call the clinic if you have any questions or concerns    Thank you for choosing [place] at [facility] to provide your oncology and hematology care.  To afford each patient quality time with our provider, please arrive at least 15 minutes before your scheduled appointment time.   Beginning January 23rd 2017 lab work for the [facility] will be done in the  Main lab at [facility] on 1st floor. If you have a lab appointment with the [place] please come in thru the  Main Entrance and check in at the main information desk  You need to re-schedule your appointment should you arrive 10 or more minutes late.  We strive to give you quality time with our providers, and arriving late affects you and other patients whose appointments are after yours.  Also, if you no show three or more times for appointments you may be dismissed from the clinic at the providers discretion.     Again, thank you for choosing [facility].  Our hope is that these requests will decrease the amount of time that you wait before being seen by our physicians.       _____________________________________________________________  Should you have questions after your visit to [facility], please contact our office at [phone] between the hours of 8:30 a.m. and 4:30 p.m.  Voicemails left after 4:30 p.m. will not be returned until the following business day.  For prescription refill requests, have your pharmacy contact our office.

## 2015-06-09 NOTE — Progress Notes (Signed)
Chemo teaching not done today because pt not getting chemo today.

## 2015-06-11 ENCOUNTER — Encounter (HOSPITAL_COMMUNITY): Payer: Medicaid Other

## 2015-06-16 ENCOUNTER — Inpatient Hospital Stay (HOSPITAL_COMMUNITY): Payer: Medicaid Other

## 2015-06-18 ENCOUNTER — Encounter (HOSPITAL_COMMUNITY): Payer: Medicaid Other

## 2015-06-23 ENCOUNTER — Encounter (HOSPITAL_BASED_OUTPATIENT_CLINIC_OR_DEPARTMENT_OTHER): Payer: Medicaid Other | Admitting: Oncology

## 2015-06-23 ENCOUNTER — Encounter (HOSPITAL_BASED_OUTPATIENT_CLINIC_OR_DEPARTMENT_OTHER): Payer: Medicaid Other

## 2015-06-23 ENCOUNTER — Encounter (HOSPITAL_COMMUNITY): Payer: Self-pay

## 2015-06-23 VITALS — BP 120/80 | HR 78 | Temp 98.0°F | Resp 18 | Wt 173.0 lb

## 2015-06-23 DIAGNOSIS — Z5111 Encounter for antineoplastic chemotherapy: Secondary | ICD-10-CM | POA: Diagnosis not present

## 2015-06-23 DIAGNOSIS — Z5112 Encounter for antineoplastic immunotherapy: Secondary | ICD-10-CM

## 2015-06-23 DIAGNOSIS — G4700 Insomnia, unspecified: Secondary | ICD-10-CM | POA: Diagnosis not present

## 2015-06-23 DIAGNOSIS — F329 Major depressive disorder, single episode, unspecified: Secondary | ICD-10-CM

## 2015-06-23 DIAGNOSIS — K6289 Other specified diseases of anus and rectum: Secondary | ICD-10-CM

## 2015-06-23 DIAGNOSIS — C7800 Secondary malignant neoplasm of unspecified lung: Secondary | ICD-10-CM | POA: Diagnosis not present

## 2015-06-23 DIAGNOSIS — C20 Malignant neoplasm of rectum: Secondary | ICD-10-CM

## 2015-06-23 DIAGNOSIS — J40 Bronchitis, not specified as acute or chronic: Secondary | ICD-10-CM

## 2015-06-23 DIAGNOSIS — R918 Other nonspecific abnormal finding of lung field: Secondary | ICD-10-CM | POA: Diagnosis not present

## 2015-06-23 DIAGNOSIS — R05 Cough: Secondary | ICD-10-CM

## 2015-06-23 DIAGNOSIS — F32A Depression, unspecified: Secondary | ICD-10-CM

## 2015-06-23 DIAGNOSIS — R059 Cough, unspecified: Secondary | ICD-10-CM

## 2015-06-23 DIAGNOSIS — R52 Pain, unspecified: Secondary | ICD-10-CM | POA: Diagnosis not present

## 2015-06-23 LAB — CBC WITH DIFFERENTIAL/PLATELET
BASOS ABS: 0 10*3/uL (ref 0.0–0.1)
BASOS PCT: 0 %
EOS ABS: 0.2 10*3/uL (ref 0.0–0.7)
Eosinophils Relative: 3 %
HEMATOCRIT: 45.3 % (ref 39.0–52.0)
HEMOGLOBIN: 14.8 g/dL (ref 13.0–17.0)
Lymphocytes Relative: 11 %
Lymphs Abs: 0.7 10*3/uL (ref 0.7–4.0)
MCH: 31.9 pg (ref 26.0–34.0)
MCHC: 32.7 g/dL (ref 30.0–36.0)
MCV: 97.6 fL (ref 78.0–100.0)
Monocytes Absolute: 0.6 10*3/uL (ref 0.1–1.0)
Monocytes Relative: 9 %
NEUTROS ABS: 5.1 10*3/uL (ref 1.7–7.7)
NEUTROS PCT: 77 %
Platelets: 240 10*3/uL (ref 150–400)
RBC: 4.64 MIL/uL (ref 4.22–5.81)
RDW: 16.4 % — AB (ref 11.5–15.5)
WBC: 6.7 10*3/uL (ref 4.0–10.5)

## 2015-06-23 LAB — COMPREHENSIVE METABOLIC PANEL
ALBUMIN: 3.5 g/dL (ref 3.5–5.0)
ALK PHOS: 98 U/L (ref 38–126)
ALT: 13 U/L — ABNORMAL LOW (ref 17–63)
ANION GAP: 9 (ref 5–15)
AST: 21 U/L (ref 15–41)
BILIRUBIN TOTAL: 0.3 mg/dL (ref 0.3–1.2)
BUN: 7 mg/dL (ref 6–20)
CO2: 24 mmol/L (ref 22–32)
Calcium: 8.8 mg/dL — ABNORMAL LOW (ref 8.9–10.3)
Chloride: 106 mmol/L (ref 101–111)
Creatinine, Ser: 0.74 mg/dL (ref 0.61–1.24)
GFR calc Af Amer: >60 mL/min (ref >60)
GFR calc non Af Amer: >60 mL/min (ref >60)
GLUCOSE: 188 mg/dL — AB (ref 65–99)
POTASSIUM: 3.6 mmol/L (ref 3.5–5.1)
SODIUM: 139 mmol/L (ref 135–145)
TOTAL PROTEIN: 7.1 g/dL (ref 6.5–8.1)

## 2015-06-23 MED ORDER — CITALOPRAM HYDROBROMIDE 40 MG PO TABS: 40.0000 mg | ORAL_TABLET | Freq: Every day | ORAL | Status: DC

## 2015-06-23 MED ORDER — PALONOSETRON HCL INJECTION 0.25 MG/5ML
INTRAVENOUS | Status: AC
  Filled 2015-06-23: qty 5

## 2015-06-23 MED ORDER — ATROPINE SULFATE 1 MG/ML IJ SOLN
0.5000 mg | Freq: Once | INTRAMUSCULAR | Status: AC | PRN
  Administered 2015-06-23: 0.5 mg via INTRAVENOUS

## 2015-06-23 MED ORDER — HEPARIN SOD (PORK) LOCK FLUSH 100 UNIT/ML IV SOLN: 500.0000 [IU] | Freq: Once | INTRAVENOUS | Status: DC | PRN

## 2015-06-23 MED ORDER — BENZONATATE 200 MG PO CAPS: 200.0000 mg | ORAL_CAPSULE | Freq: Three times a day (TID) | ORAL | Status: DC | PRN

## 2015-06-23 MED ORDER — LEUCOVORIN CALCIUM INJECTION 350 MG
400.0000 mg/m2 | Freq: Once | INTRAVENOUS | Status: AC
  Administered 2015-06-23: 776 mg via INTRAVENOUS
  Filled 2015-06-23: qty 38.8

## 2015-06-23 MED ORDER — PALONOSETRON HCL INJECTION 0.25 MG/5ML
0.2500 mg | Freq: Once | INTRAVENOUS | Status: AC
  Administered 2015-06-23: 0.25 mg via INTRAVENOUS

## 2015-06-23 MED ORDER — IRINOTECAN HCL CHEMO INJECTION 100 MG/5ML
180.0000 mg/m2 | Freq: Once | INTRAVENOUS | Status: AC
  Administered 2015-06-23: 350 mg via INTRAVENOUS
  Filled 2015-06-23: qty 5

## 2015-06-23 MED ORDER — SODIUM CHLORIDE 0.9 % IV SOLN
5.0000 mg/kg | Freq: Once | INTRAVENOUS | Status: AC
  Administered 2015-06-23: 400 mg via INTRAVENOUS
  Filled 2015-06-23: qty 16

## 2015-06-23 MED ORDER — SODIUM CHLORIDE 0.9% FLUSH: 10.0000 mL | INTRAVENOUS | Status: DC | PRN

## 2015-06-23 MED ORDER — AMITRIPTYLINE HCL 50 MG PO TABS: 50.0000 mg | ORAL_TABLET | Freq: Every day | ORAL | Status: DC

## 2015-06-23 MED ORDER — HYDROMORPHONE HCL 4 MG PO TABS: 4.0000 mg | ORAL_TABLET | ORAL | Status: DC | PRN

## 2015-06-23 MED ORDER — SODIUM CHLORIDE 0.9 % IV SOLN
Freq: Once | INTRAVENOUS | Status: AC
  Administered 2015-06-23: 11:00:00 via INTRAVENOUS

## 2015-06-23 MED ORDER — SODIUM CHLORIDE 0.9 % IV SOLN
10.0000 mg | Freq: Once | INTRAVENOUS | Status: AC
  Administered 2015-06-23: 10 mg via INTRAVENOUS
  Filled 2015-06-23: qty 1

## 2015-06-23 MED ORDER — PREDNISONE 5 MG PO TABS: ORAL_TABLET | ORAL | Status: DC

## 2015-06-23 MED ORDER — SODIUM CHLORIDE 0.9 % IV SOLN
2400.0000 mg/m2 | INTRAVENOUS | Status: DC
  Administered 2015-06-23: 4650 mg via INTRAVENOUS
  Filled 2015-06-23: qty 93

## 2015-06-23 MED ORDER — ATROPINE SULFATE 1 MG/ML IJ SOLN
INTRAMUSCULAR | Status: AC
  Filled 2015-06-23: qty 1

## 2015-06-23 NOTE — Progress Notes (Signed)
Consent signed for Irinotecan, Leucovorin, Avastin, 5FU. Diarrhea sheet given and patient instructed to call me with any problems.

## 2015-06-23 NOTE — Progress Notes (Signed)
Patient tolerated infusion well.  VSS.   

## 2015-06-23 NOTE — Assessment & Plan Note (Addendum)
Stage IV rectal cancer, treated with systemic chemotherapy consisting of FOLFOX + Avastin from 11/05/2014- 05/26/2015 with CT imaging demonstrating progression of disease in pulmonary lesions, therefore a change in therapy to FOLFIRI + Avastin began on 06/23/2015.    Oncology history is updated.  His pain regimen consists of Dilaudid, Fentanyl and Lyrica.  Lyrica has been very helpful in his pain management.  He requests a refill on his Dilaudid, Fentanyl, and Lyrica and this is completed today.  He is currently on Fentanyl 150 mcg/hr, Dilaudid 4 mg PRN, and Lyrica 150 mg daily.  I will not change any pain medication at this time.  He needs a refill of Dilaudid.  I given him a prescription for Tessalon 200 mg for his cough. He is status post 2 treatments with antibiotics including azithromycin and Levaquin. He is afebrile and does not look ill. His cough is nonproductive.  For his difficulty with sleeping, I have recommended melatonin or Benadryl or Tylenol PM.  We discussed his treatment options which includes moving on with FOLFIRI plus Avastin as planned today, his labs satisfy treatment parameters. His other options include a change in treatment and stopping treatment altogether. He is interested in pursuing FOLFIRI plus Avastin treatment today as planned. I believe he starting to face his mortality and think about his future and likely death from his metastatic rectal cancer.  Return in 2 weeks for follow-up and treatment.

## 2015-06-23 NOTE — Patient Instructions (Signed)
[facility] Discharge Instructions for Patients Receiving Chemotherapy   Beginning January 23rd 2017 lab work for the [facility] will be done in the  Main lab at [facility] on 1st floor. If you have a lab appointment with the [place] please come in thru the  Main Entrance and check in at the main information desk   Today you received the following chemotherapy agents: Irinotecan, Avastin, and Leucovorin.     If you develop nausea and vomiting, or diarrhea that is not controlled by your medication, call the clinic.  The clinic phone number is [phone]. Office hours are Monday-Friday 8:30am-5:00pm.  BELOW ARE SYMPTOMS THAT SHOULD BE REPORTED IMMEDIATELY:  *FEVER GREATER THAN 101.0 F  *CHILLS WITH OR WITHOUT FEVER  NAUSEA AND VOMITING THAT IS NOT CONTROLLED WITH YOUR NAUSEA MEDICATION  *UNUSUAL SHORTNESS OF BREATH  *UNUSUAL BRUISING OR BLEEDING  TENDERNESS IN MOUTH AND THROAT WITH OR WITHOUT PRESENCE OF ULCERS  *URINARY PROBLEMS  *BOWEL PROBLEMS  UNUSUAL RASH Items with * indicate a potential emergency and should be followed up as soon as possible. If you have an emergency after office hours please contact your primary care physician or go to the nearest emergency department.  Please call the clinic during office hours if you have any questions or concerns.   You may also contact the Patient Navigator at [phone] should you have any questions or need assistance in obtaining follow up care.

## 2015-06-23 NOTE — Progress Notes (Signed)
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 male returns for followup of Stage IV rectal cancer, KRAS wild-type.    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    09/25/2014 Imaging CT abd/pelvis- Indeterminate nodules within the bilateral lung bases with dominant approximately 1.7 cm centrally necrotic nodule within the right lower lobe...   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   10/03/2014 Tumor Marker CEA NOT ELEVATED   10/09/2014 Imaging CT Chest- Bilateral pulmonary nodules of varying size are most consistent with pulmonary metastasis. There are approximately 6 lesions in each lung.   10/17/2014 Pathology Results Rectum, biopsy - INVASIVE ADENOCARCINOMA   10/29/2014 Pathology Results Diagnosis Lung, needle/core biopsy(ies), right lower lobe - METASTATIC ADENOCARCINOMA   10/29/2014 Pathology Results KRAS wildtype   11/04/2014 - 12/18/2014 Radiation Therapy Dr. Lisbeth Renshaw   11/05/2014 - 05/26/2015 Chemotherapy FOLFOX + Avastin   02/11/2015 Surgery Colostomy formation by Dr. Arnoldo Morale.   02/26/2015 Imaging CT CAP- Interval response to therapy with decrease in size of multiple bilateral pulmonary nodules. No new or progressive pulmonary metastasis.  No evidence for metastatic disease within the abdomen or pelvis.    03/22/2015 Imaging CT RENAL STONE- There is an obstructing 4 mm stone within  the distal left ureter resulting in moderate left hydroureteronephrosis.  Re- demonstrated pulmonary nodules within the right lower lobe and lingula, compatible with known metastasis.   05/20/2015 Progression CT Scans demonstrate pulmonary progression of disease.   05/20/2015 Imaging CT CAP- Mild progression of bilateral pulmonary metastases, including a 1.9 cm cavitary nodule in the right lower lobe with increasing solid component.   06/23/2015 -  Chemotherapy FOLFIRI + Avastin    I personally reviewed and went over laboratory results with the patient.  The results are noted within this dictation.  He meets treatment parameters today.  As usual, the patient has multiple complaints: #1. He reports that he is weak and has no strength. He notes that he is laid in bed most of the weekend despite the nice weather. #2 issues with sleeping. He has not tried anything at home are over-the-counter for this. He notes a cough nonproductive that keeps him up at night. #3 cough, nonproductive. This cough causes him discomfort in his abdomen, particularly at the site of the ostomy. He reports a hernia at the site of the ostomy but this is mild. On physical exam of course, his abdomen is tender to palpation. He denies any fevers or chills. His cough, as mentioned is nonproductive.  Today, the patient was tearful. He reports "I am at a loss" referring to what he should do with regards to treatment. As a result, I had a discussion with him regarding his treatment options. He is scheduled to start FOLFIRI plus Avastin today.  He is educated on the risks, benefits, alternatives, and side effects of this chemotherapy regimen. He's already has had Avastin and 5-FU based therapy in  the past. The only change is with the oxaliplatin and a irinotecan. I reviewed the risk of diarrhea. He will receive atropine. He is given the option today to start treatment if he wishes. I reviewed his laboratory work with him and informed him his  laboratory work is good and meets treatment parameters. He reports that he like to try treatment at this juncture.  He reports he needs refills on a number of medications but cannot remember the names.  Past Medical History  Diagnosis Date  . Rectal pain   . Arthritis   . Renal cell cancer ([place])     2012.   [name] Rectal cancer ([place])   . Shortness of breath dyspnea   . Anxiety   . Depression   . 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     is allergic to oxaliplatin.  Current Outpatient Prescriptions on File Prior to Visit  Medication Sig Dispense Refill  . albuterol (PROVENTIL HFA;VENTOLIN HFA) 108 (90 Base) MCG/ACT inhaler Inhale 2 puffs into the lungs every 6 (six) hours as needed for wheezing or shortness of breath. 1 Inhaler 2  . Bevacizumab (AVASTIN IV) Inject into the vein every 14 (fourteen) days. To start November 19, 2014    . dextrose 5 % SOLN 1,000 mL with fluorouracil 5 GM/100ML SOLN Inject into the vein every 14 (fourteen) days. To start November 05, 2014. To infuse over 46 hours via pump.    [name] docusate sodium (COLACE) 100 MG capsule Take 1 capsule (100 mg total) by mouth every 12 (twelve) hours. (Patient not taking: Reported on 06/09/2015) 60 capsule 0  . DULoxetine (CYMBALTA) 30 MG capsule Take one capsule daily for 5 days then 2 daily thereafter (Patient taking differently: Take 30 mg by mouth daily. ) 60 capsule 3  . fentaNYL (DURAGESIC - DOSED MCG/HR) 75 MCG/HR Place 2 patches (150 mcg total) onto the skin every 3 (three) days. 20 patch 0  . IRINOTECAN HCL IV Inject into the vein. Reported on 06/09/2015    . leucovorin 50 MG injection Inject into the vein every 14 (fourteen) days. To start November 05, 2014    . lidocaine-hydrocortisone (ANAMANTLE) 3-1 % KIT Place 1 application rectally 2 (two) times daily. For  two weeks, alternate with Rectiv. 28 each 1  . lidocaine-prilocaine (EMLA) cream Apply a quarter size amount to port site 1 hour prior to chemo. Do not rub in. Cover with plastic wrap. 30 g 3  . nitroGLYCERIN (NITROGLYN) 2 % ointment Apply 0.5 inches topically daily. Anal area    . ondansetron (ZOFRAN ODT) 4 MG disintegrating tablet 19m ODT q4 hours prn nausea/vomit (Patient not taking: Reported on 03/31/2015) 20 tablet 0  . ondansetron (ZOFRAN) 4 mg TABS tablet Take 4 tablets by mouth every 8 (eight) hours as needed. (Patient not taking: Reported on 06/09/2015) 4 tablet 0  . potassium chloride SA (K-DUR,KLOR-CON) 20 MEQ tablet Take 1 tablet (20 mEq total) by mouth 2 (two) times daily. (Patient not taking: Reported on 03/31/2015) 30 tablet 0  . pregabalin (LYRICA) 150 MG capsule Take 1 capsule (150 mg total) by mouth 2 (two) times daily. 60 capsule 3  . prochlorperazine (COMPAZINE) 10 MG tablet Take 1 tablet (10 mg total) by mouth every 6 (six) hours as needed for nausea or vomiting. (Patient  not taking: Reported on 06/09/2015) 60 tablet 0   Current Facility-Administered Medications on File Prior to Visit  Medication Dose Route Frequency Provider Last Rate Last Dose  . 0.9 %  sodium chloride infusion   Intravenous Continuous Kelby Fam [place], MD      . bevacizumab (AVASTIN) 400 mg in sodium chloride 0.9 % 100 mL chemo infusion  5 mg/kg (Treatment Plan Actual) Intravenous Once Patrici Ranks, MD 696 mL/hr at 06/23/15 1233 400 mg at 06/23/15 1233  . fluorouracil (ADRUCIL) 4,650 mg in sodium chloride 0.9 % 57 mL chemo infusion  2,400 mg/m2 (Treatment Plan Actual) Intravenous 1 day or 1 dose Patrici Ranks, MD      . heparin lock flush 100 unit/mL  500 Units Intracatheter Once PRN Patrici Ranks, MD      . irinotecan (CAMPTOSAR) 350 mg in dextrose 5 % 500 mL chemo infusion  180 mg/m2 (Treatment Plan Actual) Intravenous Once Patrici Ranks, MD      . leucovorin 776 mg in dextrose 5 % 250 mL  infusion  400 mg/m2 (Treatment Plan Actual) Intravenous Once Patrici Ranks, MD      . sodium chloride flush (NS) 0.9 % injection 10 mL  10 mL Intracatheter PRN Patrici Ranks, MD        Past Surgical History  Procedure Laterality Date  . Hypospadias correction    . Laparoscopic partial nephrectomy  2012    right side  . Knee surgery Left     arthroscopy  . Flexible sigmoidoscopy N/A 09/30/2014    Procedure: FLEXIBLE SIGMOIDOSCOPY;  Surgeon: Danie Binder, MD;  Location: [facility];  Service: Endoscopy;  Laterality: N/A;  . Biopsy N/A 09/30/2014    Procedure: BIOPSY;  Surgeon: Danie Binder, MD;  Location: [facility];  Service: Endoscopy;  Laterality: N/A;  Anal Canal  . Flexible sigmoidoscopy N/A 10/17/2014    Procedure: FLEXIBLE SIGMOIDOSCOPY;  Surgeon: Danie Binder, MD;  Location: [facility];  Service: Endoscopy;  Laterality: N/A;  1325  . Lung biopsy Right   . Colostomy N/A 02/11/2015    Procedure: COLOSTOMY;  Surgeon: Aviva Signs, MD;  Location: [facility];  Service: General;  Laterality: N/A;    Denies any headaches, dizziness, double vision, fevers, chills, night sweats, nausea, vomiting, diarrhea, constipation, chest pain, heart palpitations, shortness of breath, blood in stool, black tarry stool, urinary pain, urinary burning, urinary frequency, hematuria.   PHYSICAL EXAMINATION  ECOG PERFORMANCE STATUS: 1 - Symptomatic but completely ambulatory  There were no vitals filed for this visit.  GENERAL:alert, no distress, well nourished, well developed, cooperative and tearful, unaccompanied, and in a chemotherapy bed  SKIN: skin color, texture, turgor are normal, no rashes or significant lesions HEAD: Normocephalic, No masses, lesions, tenderness or abnormalities EYES: normal, EOMI, Conjunctiva are pink and non-injected EARS: External exam is normal OROPHARYNX:lips, buccal mucosa, and tongue normal and mucous membranes are moist , poor dentition NECK: supple, trachea  midline LYMPH:  no palpable lymphadenopathy BREAST:not examined LUNGS: clear to auscultation without any wheezes, rales, or rhonchi HEART: regular rate & rhythm, without murmur, rub, or gallop. ABDOMEN:abdomen soft, normal bowel sounds and colostomy producing soft stool appropriately. BACK: Back symmetric, no curvature., No CVA tenderness EXTREMITIES:less then 2 second capillary refill, no joint deformities, effusion, or inflammation, no skin discoloration, no cyanosis  NEURO: alert & oriented x 3 with fluent speech, no focal motor/sensory deficits.  LABORATORY DATA: CBC    Component Value Date/Time  WBC 6.7 06/23/2015 0938   RBC 4.64 06/23/2015 0938   HGB 14.8 06/23/2015 0938   HCT 45.3 06/23/2015 0938   PLT 240 06/23/2015 0938   MCV 97.6 06/23/2015 0938   MCH 31.9 06/23/2015 0938   MCHC 32.7 06/23/2015 0938   RDW 16.4* 06/23/2015 0938   LYMPHSABS 0.7 06/23/2015 0938   MONOABS 0.6 06/23/2015 0938   EOSABS 0.2 06/23/2015 0938   BASOSABS 0.0 06/23/2015 0938      Chemistry      Component Value Date/Time   NA 139 06/23/2015 0938   K 3.6 06/23/2015 0938   CL 106 06/23/2015 0938   CO2 24 06/23/2015 0938   BUN 7 06/23/2015 0938   CREATININE 0.74 06/23/2015 0938      Component Value Date/Time   CALCIUM 8.8* 06/23/2015 0938   ALKPHOS 98 06/23/2015 0938   AST 21 06/23/2015 0938   ALT 13* 06/23/2015 0938   BILITOT 0.3 06/23/2015 0938     Lab Results  Component Value Date   CEA 3.3 05/26/2015     PENDING LABS:   RADIOGRAPHIC STUDIES:  Dg Chest 2 View  06/09/2015  CLINICAL DATA:  Rectal cancer with metastatic disease to lung. Cough and short of breath EXAM: CHEST  2 VIEW COMPARISON:  Chest two views 10/31/2014.  Chest CT 05/20/2015 FINDINGS: Port-A-Cath tip in the SVC unchanged. Heart size and vascularity normal. Negative for pneumonia or effusion. Small lung nodules are best seen on the recent CT scan. Cavitary nodule right posterior lung base unchanged from the recent  CT. IMPRESSION: Cavitary nodule right lower lobe as noted on CT. Other small nodules best seen on CT. Electronically Signed   By: Franchot Gallo M.D.   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    Oncology history is updated.  His pain regimen consists of Dilaudid, Fentanyl and Lyrica.  Lyrica has been very helpful in his pain management.  He requests a refill on his Dilaudid, Fentanyl, and Lyrica and this is completed today.  He is currently on Fentanyl 150 mcg/hr, Dilaudid 4 mg PRN, and Lyrica 150 mg daily.  I will not change any pain medication at this time.  He needs a refill of Dilaudid.  I given him a prescription for Tessalon 200 mg for his cough. He is status post 2 treatments with antibiotics including azithromycin and Levaquin. He is afebrile and does not look ill. His cough is nonproductive.  For his difficulty with sleeping, I have recommended melatonin or Benadryl or Tylenol PM.  We discussed his treatment options which includes moving on with FOLFIRI plus Avastin as planned today, his labs satisfy treatment parameters. His other options include a change in treatment and stopping treatment altogether. He is interested in pursuing FOLFIRI plus Avastin treatment today as planned. I believe he starting to face his mortality and think about his future and likely death from his metastatic rectal cancer.  Return in 2 weeks for follow-up and treatment.  THERAPY PLAN:  Continue treatment as planned monitoring for toxicities and intolerances.  Additionally, we will continue to monitor for progressive disease that would require a change in therapy to second line option(s). We will  restage him in about  1-2 weeks with repeat CT imaging to help guide future treatment options.  All questions were answered. The patient knows to call the clinic with any problems, questions or concerns. We can certainly see the patient much sooner if necessary.  Patient and plan discussed with Dr. Ancil Linsey and she is in agreement with the aforementioned.   This note is electronically signed by: Doy Mince 06/23/2015 12:40 PM

## 2015-06-23 NOTE — Patient Instructions (Addendum)
[place] at [facility] Discharge Instructions  RECOMMENDATIONS MADE BY THE CONSULTANT AND ANY TEST RESULTS WILL BE SENT TO YOUR REFERRING PHYSICIAN.  Exam and discussion today with Kirby Crigler, PA. Refills on Elavil and Celexa. Prescription for Tessalon 200 mg three times daily as needed for cough. Use Benadryl and/or melatonin for sleep. Chemotherapy as planned today. Return in 2 weeks for office visit and treatment.   Thank you for choosing [place] at [facility] to provide your oncology and hematology care.  To afford each patient quality time with our provider, please arrive at least 15 minutes before your scheduled appointment time.   Beginning January 23rd 2017 lab work for the [facility] will be done in the  Main lab at [facility] on 1st floor. If you have a lab appointment with the [facility] please come in thru the  Main Entrance and check in at the main information desk  You need to re-schedule your appointment should you arrive 10 or more minutes late.  We strive to give you quality time with our providers, and arriving late affects you and other patients whose appointments are after yours.  Also, if you no show three or more times for appointments you may be dismissed from the clinic at the providers discretion.     Again, thank you for choosing [facility].  Our hope is that these requests will decrease the amount of time that you wait before being seen by our physicians.       _____________________________________________________________  Should you have questions after your visit to [facility], please contact our office at [phone] between the hours of 8:30 a.m. and 4:30 p.m.  Voicemails left after 4:30 p.m. will not be returned until the following business day.  For prescription refill requests, have your pharmacy contact our office.

## 2015-06-24 LAB — CEA: CEA: 3.6 ng/mL (ref 0.0–4.7)

## 2015-06-25 ENCOUNTER — Encounter (HOSPITAL_BASED_OUTPATIENT_CLINIC_OR_DEPARTMENT_OTHER): Payer: Medicaid Other

## 2015-06-25 ENCOUNTER — Encounter (HOSPITAL_COMMUNITY): Payer: Self-pay

## 2015-06-25 VITALS — BP 131/70 | HR 83 | Temp 98.5°F | Resp 18

## 2015-06-25 DIAGNOSIS — C7800 Secondary malignant neoplasm of unspecified lung: Secondary | ICD-10-CM

## 2015-06-25 DIAGNOSIS — Z452 Encounter for adjustment and management of vascular access device: Secondary | ICD-10-CM | POA: Diagnosis present

## 2015-06-25 DIAGNOSIS — C20 Malignant neoplasm of rectum: Secondary | ICD-10-CM

## 2015-06-25 MED ORDER — ZOLPIDEM TARTRATE 10 MG PO TABS: 10.0000 mg | ORAL_TABLET | Freq: Every day | ORAL | Status: DC

## 2015-06-25 MED ORDER — HEPARIN SOD (PORK) LOCK FLUSH 100 UNIT/ML IV SOLN
500.0000 [IU] | Freq: Once | INTRAVENOUS | Status: AC | PRN
  Administered 2015-06-25: 500 [IU]

## 2015-06-25 MED ORDER — SODIUM CHLORIDE 0.9% FLUSH
10.0000 mL | INTRAVENOUS | Status: DC | PRN
  Administered 2015-06-25: 10 mL
  Filled 2015-06-25: qty 10

## 2015-06-25 NOTE — Progress Notes (Signed)
..  Andrew Kline returns today for port de access and flush after 46 hr continous infusion of 55f. Tolerated infusion without problems. Portacath located rt chest wall was  deaccessed and flushed with 2109mNS and 500U/23m61meparin and needle removed intact.  Procedure without incident. Patient tolerated procedure well.

## 2015-06-30 ENCOUNTER — Inpatient Hospital Stay (HOSPITAL_COMMUNITY): Payer: Medicaid Other

## 2015-07-02 ENCOUNTER — Encounter (HOSPITAL_COMMUNITY): Payer: Medicaid Other | Attending: Hematology & Oncology | Admitting: Hematology & Oncology

## 2015-07-02 ENCOUNTER — Encounter (HOSPITAL_COMMUNITY): Payer: Medicaid Other

## 2015-07-02 ENCOUNTER — Encounter (HOSPITAL_COMMUNITY): Payer: Self-pay | Admitting: Hematology & Oncology

## 2015-07-02 VITALS — BP 138/81 | HR 89 | Temp 98.2°F | Resp 20 | Wt 179.0 lb

## 2015-07-02 DIAGNOSIS — K6289 Other specified diseases of anus and rectum: Secondary | ICD-10-CM | POA: Diagnosis not present

## 2015-07-02 DIAGNOSIS — Z720 Tobacco use: Secondary | ICD-10-CM | POA: Diagnosis not present

## 2015-07-02 DIAGNOSIS — C7800 Secondary malignant neoplasm of unspecified lung: Secondary | ICD-10-CM

## 2015-07-02 DIAGNOSIS — R634 Abnormal weight loss: Secondary | ICD-10-CM

## 2015-07-02 DIAGNOSIS — R918 Other nonspecific abnormal finding of lung field: Secondary | ICD-10-CM | POA: Insufficient documentation

## 2015-07-02 DIAGNOSIS — J40 Bronchitis, not specified as acute or chronic: Secondary | ICD-10-CM

## 2015-07-02 DIAGNOSIS — G893 Neoplasm related pain (acute) (chronic): Secondary | ICD-10-CM

## 2015-07-02 DIAGNOSIS — C20 Malignant neoplasm of rectum: Secondary | ICD-10-CM

## 2015-07-02 NOTE — Progress Notes (Signed)
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    09/25/2014 Imaging CT abd/pelvis- Indeterminate nodules within the bilateral lung bases with dominant approximately 1.7 cm centrally necrotic nodule within the right lower lobe...   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   10/03/2014 Tumor Marker CEA NOT ELEVATED   10/09/2014 Imaging CT Chest- Bilateral pulmonary nodules of varying size are most consistent with pulmonary metastasis. There are approximately 6 lesions in each lung.   10/17/2014 Pathology Results Rectum, biopsy - INVASIVE ADENOCARCINOMA   10/29/2014 Pathology Results Diagnosis Lung, needle/core biopsy(ies), right lower lobe - METASTATIC ADENOCARCINOMA   10/29/2014 Pathology Results KRAS wildtype   11/04/2014 - 12/18/2014 Radiation Therapy Dr. Lisbeth Renshaw   11/05/2014 - 05/26/2015 Chemotherapy FOLFOX + Avastin   02/11/2015 Surgery Colostomy formation by Dr. Arnoldo Morale.   02/26/2015 Imaging CT CAP- Interval response to therapy with decrease in size of multiple bilateral pulmonary nodules. No new or progressive pulmonary metastasis.  No evidence for metastatic disease within the abdomen or pelvis.    03/22/2015 Imaging CT RENAL STONE- There is an obstructing 4 mm stone within the distal left ureter resulting in moderate left hydroureteronephrosis.  Re- demonstrated pulmonary nodules within the right lower lobe and lingula, compatible with known metastasis.   05/20/2015 Progression CT Scans demonstrate pulmonary progression of  disease.   05/20/2015 Imaging CT CAP- Mild progression of bilateral pulmonary metastases, including a 1.9 cm cavitary nodule in the right lower lobe with increasing solid component.   06/23/2015 -  Chemotherapy FOLFIRI + Avastin    HISTORY OF PRESENTING ILLNESS:  Andrew Kline 50 y.o. male is here because of stage IV rectal cancer.  He is here alone today. He goes by Andrew Kline.   He started his new treatment last week with FOLFIRI/AVASTIN and stated that it was "weird". He said that Thursday and Friday were the best days that he has ever had. He went out to dinner with his wife to an asian buffet and ate a lot. He states that he felt full of fire. However Saturday, Sunday, and Monday he was in bed all day. He had no energy and could not get moving. He states that he had severe fatigue. Late Monday early Tuesday he states that he had the "runs" and it got progressively worse. He took Kaopectate not Immodium to help alleviate and he has had a clean bag ever since.   He has gained 7 pounds since last visit but doesn't want to gain more weight. He says his appetite is great.   He states that his hands and feet still feel tingly and that sometimes his dexterity is not good. When he went out to dinner with his wife he could not feel his chopsticks in his hand and that his fingers couldn't work.   He states that he has cut down on his smoking and he is down to a quarter of a pack per day. However he has stress at home which makes it worse. He has been doing end of life planning with his wife and this has been  stressful.   He states that he has a hernia around his stoma. Which is painful when he coughs or sneezes.   Previous cold has resolved.   MEDICAL HISTORY:  Past Medical History  Diagnosis Date  . Rectal pain   . Arthritis   . Renal cell cancer ([place])     2012.   [name] Rectal cancer ([place])   . Shortness of breath dyspnea   . Anxiety   . Depression   . Kidney stone 03/22/15    left    SURGICAL  HISTORY: Past Surgical History  Procedure Laterality Date  . Hypospadias correction    . Laparoscopic partial nephrectomy  2012    right side  . Knee surgery Left     arthroscopy  . Flexible sigmoidoscopy N/A 09/30/2014    Procedure: FLEXIBLE SIGMOIDOSCOPY;  Surgeon: Danie Binder, MD;  Location: [facility];  Service: Endoscopy;  Laterality: N/A;  . Biopsy N/A 09/30/2014    Procedure: BIOPSY;  Surgeon: Danie Binder, MD;  Location: [facility];  Service: Endoscopy;  Laterality: N/A;  Anal Canal  . Flexible sigmoidoscopy N/A 10/17/2014    Procedure: FLEXIBLE SIGMOIDOSCOPY;  Surgeon: Danie Binder, MD;  Location: [facility];  Service: Endoscopy;  Laterality: N/A;  1325  . Lung biopsy Right   . Colostomy N/A 02/11/2015    Procedure: COLOSTOMY;  Surgeon: Aviva Signs, MD;  Location: [facility];  Service: General;  Laterality: N/A;    SOCIAL HISTORY: Social History   Social History  . Marital Status: Divorced    Spouse Name: N/A  . Number of Children: 2  . Years of Education: N/A   Occupational History  . retail    Social History Main Topics  . Smoking status: Current Every Day Smoker -- 0.50 packs/day for 30 years    Types: Cigarettes  . Smokeless tobacco: Not on file     Comment: a little over a pack daily  . Alcohol Use: 0.0 oz/week    0 Standard drinks or equivalent per week     Comment: Occ  . Drug Use: No  . Sexual Activity: Yes    Birth Control/ Protection: None   Other Topics Concern  . Not on file   Social History Narrative  He is from [place], [place]. Moved here in March. Smoker, 1 ppd. Was 1.5 ppd. ETOH, none. 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 He is divorced. Lives with fiance. 2 children. 76 and 9 yo. No grandchildren.  FAMILY HISTORY: Family History  Problem Relation Age of Onset  . Lung cancer Maternal Grandfather   . Colon cancer Maternal Uncle     age  65  . Diabetes Other   . Healthy Mother    indicated that his mother is alive. He indicated that his father is alive.   Mother living, 59 yo. Still working as a Audiological scientist. Father living, 64-70 yo. 1 brother, 1 sister, 1 step sister. Maternal uncle has colon cancer. He is married to a GI nurse. So he has people to go to with questions and is informed on what is happening. History of diabetes in the family. No history of heart disease in the family.  ALLERGIES:  is allergic to oxaliplatin.  MEDICATIONS:  Current Outpatient Prescriptions  Medication Sig Dispense Refill  . albuterol (PROVENTIL HFA;VENTOLIN HFA) 108 (90 Base) MCG/ACT inhaler Inhale 2 puffs into the lungs every 6 (  six) hours as needed for wheezing or shortness of breath. 1 Inhaler 2  . amitriptyline (ELAVIL) 50 MG tablet Take 1 tablet (50 mg total) by mouth at bedtime. 30 tablet 2  . benzonatate (TESSALON) 200 MG capsule Take 1 capsule (200 mg total) by mouth 3 (three) times daily as needed for cough. 45 capsule 1  . Bevacizumab (AVASTIN IV) Inject into the vein every 14 (fourteen) days. To start November 19, 2014    . citalopram (CELEXA) 40 MG tablet Take 1 tablet (40 mg total) by mouth daily. 30 tablet 1  . dextrose 5 % SOLN 1,000 mL with fluorouracil 5 GM/100ML SOLN Inject into the vein every 14 (fourteen) days. To start November 05, 2014. To infuse over 46 hours via pump.    [name] docusate sodium (COLACE) 100 MG capsule Take 1 capsule (100 mg total) by mouth every 12 (twelve) hours. 60 capsule 0  . DULoxetine (CYMBALTA) 30 MG capsule Take one capsule daily for 5 days then 2 daily thereafter (Patient taking differently: Take 30 mg by mouth daily. ) 60 capsule 3  . fentaNYL (DURAGESIC - DOSED MCG/HR) 75 MCG/HR Place 2 patches (150 mcg total) onto the skin every 3 (three) days. 20 patch 0  . HYDROmorphone (DILAUDID) 4 MG tablet Take 1 tablet (4 mg total) by mouth every 4 (four) hours as needed for severe pain. 60 tablet 0  . IRINOTECAN HCL  IV Inject into the vein. Reported on 06/09/2015    . leucovorin 50 MG injection Inject into the vein every 14 (fourteen) days. To start November 05, 2014    . lidocaine-prilocaine (EMLA) cream Apply a quarter size amount to port site 1 hour prior to chemo. Do not rub in. Cover with plastic wrap. 30 g 3  . nitroGLYCERIN (NITROGLYN) 2 % ointment Apply 0.5 inches topically daily. Anal area    . ondansetron (ZOFRAN ODT) 4 MG disintegrating tablet 2m ODT q4 hours prn nausea/vomit 20 tablet 0  . ondansetron (ZOFRAN) 4 mg TABS tablet Take 4 tablets by mouth every 8 (eight) hours as needed. 4 tablet 0  . predniSONE (DELTASONE) 5 MG tablet 4 tablets daily for 3 days, 3 tablets for 3 days, 2 tablets for 3 days 1 tablet for 3 days then off 30 tablet 0  . pregabalin (LYRICA) 150 MG capsule Take 1 capsule (150 mg total) by mouth 2 (two) times daily. 60 capsule 3  . prochlorperazine (COMPAZINE) 10 MG tablet Take 1 tablet (10 mg total) by mouth every 6 (six) hours as needed for nausea or vomiting. 60 tablet 0  . zolpidem (AMBIEN) 10 MG tablet Take 1 tablet (10 mg total) by mouth at bedtime. 30 tablet 2  . lidocaine-hydrocortisone (ANAMANTLE) 3-1 % KIT Place 1 application rectally 2 (two) times daily. For two weeks, alternate with Rectiv. 28 each 1  . potassium chloride SA (K-DUR,KLOR-CON) 20 MEQ tablet Take 1 tablet (20 mEq total) by mouth 2 (two) times daily. (Patient not taking: Reported on 03/31/2015) 30 tablet 0   No current facility-administered medications for this visit.   Facility-Administered Medications Ordered in Other Visits  Medication Dose Route Frequency Provider Last Rate Last Dose  . 0.9 %  sodium chloride infusion   Intravenous Continuous SPatrici Ranks MD        Review of Systems  Constitutional: Positive for malaise/fatigue Fatigue with treatment. HENT: Negative. Respiratory: Negative. Gastrointestinal: Positive for rectal pain, diarrhea Negative for blood in stool. Occasional rectal  discharge  Diarrhea with  treatment and was resolved with Kaopectate Genitourinary: Negative Psychiatric/Behavioral: Positive for ANXIETY.  14 point ROS was done and is otherwise as detailed above or in HPI   PHYSICAL EXAMINATION:  ECOG PERFORMANCE STATUS: 1 - Symptomatic but completely ambulatory Vitals with BMI 07/02/2015  Height   Weight 179 lbs  BMI   Systolic 993  Diastolic 81  Pulse 89  Respirations 20    Physical Exam  Constitutional: He is oriented to person, place, and time and well-developed, well-nourished, and in no distress.  HENT:  Head: Normocephalic and atraumatic.  Nose: Nose normal.  Mouth/Throat: Oropharynx is clear and moist. No oropharyngeal exudate.  Dentures on top.  Eyes: Conjunctivae and EOM are normal. Pupils are equal, round, and reactive to light. Right eye exhibits no discharge. Left eye exhibits no discharge. No scleral icterus.  Neck: Normal range of motion. Neck supple. No tracheal deviation present. No thyromegaly present.  Cardiovascular: Normal rate, regular rhythm and normal heart sounds.  Exam reveals no gallop and no friction rub.   No murmur heard. Pulmonary/Chest: Normal Abdominal: Soft. Bowel sounds are normal. He exhibits no distension and no mass. There is no tenderness. There is no rebound and no guarding. Ostomy is c/d/i  Genitourinary:   Musculoskeletal: Normal range of motion. He exhibits no edema.  Lymphadenopathy:    He has no cervical adenopathy.  Neurological: He is alert and oriented to person, place, and time. He has normal reflexes. No cranial nerve deficit. Gait normal. Coordination normal.  Skin: Skin is warm and dry. No rash noted.  Psychiatric: Mood, memory, affect and judgment normal.  Nursing note and vitals reviewed.   LABORATORY DATA:  I have reviewed the data as listed Lab Results  Component Value Date   WBC 6.7 06/23/2015   HGB 14.8 06/23/2015   HCT 45.3 06/23/2015   MCV 97.6 06/23/2015   PLT 240  06/23/2015   CMP     Component Value Date/Time   NA 139 06/23/2015 0938   K 3.6 06/23/2015 0938   CL 106 06/23/2015 0938   CO2 24 06/23/2015 0938   GLUCOSE 188* 06/23/2015 0938   BUN 7 06/23/2015 0938   CREATININE 0.74 06/23/2015 0938   CALCIUM 8.8* 06/23/2015 0938   PROT 7.1 06/23/2015 0938   ALBUMIN 3.5 06/23/2015 0938   AST 21 06/23/2015 0938   ALT 13* 06/23/2015 0938   ALKPHOS 98 06/23/2015 0938   BILITOT 0.3 06/23/2015 0938   GFRNONAA >60 06/23/2015 0938   GFRAA >60 06/23/2015 7169    RADIOLOGY: I have personally reviewed the radiological images as listed and agreed with the findings in the report. CLINICAL DATA: Rectal cancer with metastatic disease to lung. Cough and short of breath  EXAM: CHEST 2 VIEW  COMPARISON: Chest two views 10/31/2014. Chest CT 05/20/2015  FINDINGS: Port-A-Cath tip in the SVC unchanged. Heart size and vascularity normal. Negative for pneumonia or effusion.  Small lung nodules are best seen on the recent CT scan. Cavitary nodule right posterior lung base unchanged from the recent CT.  IMPRESSION: Cavitary nodule right lower lobe as noted on CT. Other small nodules best seen on CT.   Electronically Signed  By: Franchot Gallo M.D.  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 Anal carcinoma, adenocarcinoma Pulmonary metastases Good PS History of partial nephrectomy 2012 at [facility] in [place] FOLFOX/AVASTIN XRT for palliation Colostomy for palliation of poor bowel control Lyrica for pain, with marked improvement in QOL  and pain scores Contact Dermatitis from Fentanyl patch Weight loss Bronchitis -- resolved FOLFIRI/AVASTIN  He has seen Dr. Arnoldo Morale in consultation and they have discussed additional surgery. He is interested especially if it can resolve his rectal pain.  I reminded him that timing is important if he proceeds because he is on Avastin.  Overall he seems to  have done well. I again reviewed the diarrhea side effects of irinotecan and appropriate treatment of it.   Patient will continue to have treatment next week. His bronchitis has finally resolved.  He will follow up next Tuesday.   This note was electronically signed.   This document serves as a record of services personally performed by Ancil Linsey, MD. It was created on her behalf by Kandace Blitz, a trained medical scribe. The creation of this record is based on the scribe's personal observations and the provider's statements to them. This document has been checked and approved by the attending provider.  I have reviewed the above documentation for accuracy and completeness, and I agree with the above.   Kelby Fam. [name], MD

## 2015-07-02 NOTE — Patient Instructions (Addendum)
[place] at [facility] Discharge Instructions  RECOMMENDATIONS MADE BY THE CONSULTANT AND ANY TEST RESULTS WILL BE SENT TO YOUR REFERRING PHYSICIAN.   Exam and discussion by Dr [name] today  We are going to try one more treatment before we switch anything.  You are doing better with your smoking, continue working on that  If you want to have surgery with Dr Arnoldo Morale just let us know and we can plan around it, we can hold treatment, you can have your surgery, heal, then we can resume your chemotherapy. The goal is to have quality of life.  Return to see the doctor as scheduled  Please call the clinic if you have any questions or concerns    Thank you for choosing [place] at [facility] to provide your oncology and hematology care.  To afford each patient quality time with our provider, please arrive at least 15 minutes before your scheduled appointment time.   Beginning January 23rd 2017 lab work for the [facility] will be done in the  Main lab at [facility] on 1st floor. If you have a lab appointment with the [facility] please come in thru the  Main Entrance and check in at the main information desk  You need to re-schedule your appointment should you arrive 10 or more minutes late.  We strive to give you quality time with our providers, and arriving late affects you and other patients whose appointments are after yours.  Also, if you no show three or more times for appointments you may be dismissed from the clinic at the providers discretion.     Again, thank you for choosing [facility].  Our hope is that these requests will decrease the amount of time that you wait before being seen by our physicians.       _____________________________________________________________  Should you have questions after your visit to [facility], please contact our office at [phone] between the hours of 8:30 a.m.  and 4:30 p.m.  Voicemails left after 4:30 p.m. will not be returned until the following business day.  For prescription refill requests, have your pharmacy contact our office.         Resources For Cancer Patients and their Caregivers ? American Cancer Society: Can assist with transportation, wigs, general needs, runs Look Good Feel Better.        819 046 0077 ? Cancer Care: Provides financial assistance, online support groups, medication/co-pay assistance.  1-800-813-HOPE (276) 704-9795) ? [place] Assists [place] cancer patients and their families through emotional , educational and financial support.  573-215-3638 ? [place] DSS Where to apply for food stamps, Medicaid and utility assistance. (671) 260-1391 ? [facility]: Transportation to medical appointments. 913-479-5525 ? Social Security Administration: May apply for disability if have a Stage IV cancer. ([phone] 757-8163 3092844049 ? [employer], Disability and Transit Services: Assists with nutrition, care and transit needs. (606)678-8244

## 2015-07-07 ENCOUNTER — Encounter (HOSPITAL_BASED_OUTPATIENT_CLINIC_OR_DEPARTMENT_OTHER): Payer: Medicaid Other | Admitting: Hematology & Oncology

## 2015-07-07 ENCOUNTER — Other Ambulatory Visit (HOSPITAL_COMMUNITY): Payer: Self-pay | Admitting: *Deleted

## 2015-07-07 ENCOUNTER — Encounter (HOSPITAL_COMMUNITY): Payer: Self-pay | Admitting: Hematology & Oncology

## 2015-07-07 ENCOUNTER — Encounter: Payer: Self-pay | Admitting: *Deleted

## 2015-07-07 ENCOUNTER — Ambulatory Visit (HOSPITAL_COMMUNITY): Payer: Medicaid Other | Admitting: Oncology

## 2015-07-07 ENCOUNTER — Encounter (HOSPITAL_BASED_OUTPATIENT_CLINIC_OR_DEPARTMENT_OTHER): Payer: Medicaid Other

## 2015-07-07 VITALS — BP 143/78 | HR 87 | Temp 98.5°F | Resp 20 | Wt 184.0 lb

## 2015-07-07 VITALS — BP 123/74 | HR 83 | Temp 98.6°F | Resp 20

## 2015-07-07 DIAGNOSIS — C7800 Secondary malignant neoplasm of unspecified lung: Secondary | ICD-10-CM

## 2015-07-07 DIAGNOSIS — K6289 Other specified diseases of anus and rectum: Secondary | ICD-10-CM | POA: Diagnosis not present

## 2015-07-07 DIAGNOSIS — R5381 Other malaise: Secondary | ICD-10-CM

## 2015-07-07 DIAGNOSIS — R918 Other nonspecific abnormal finding of lung field: Secondary | ICD-10-CM | POA: Diagnosis not present

## 2015-07-07 DIAGNOSIS — R05 Cough: Secondary | ICD-10-CM

## 2015-07-07 DIAGNOSIS — F419 Anxiety disorder, unspecified: Secondary | ICD-10-CM | POA: Diagnosis not present

## 2015-07-07 DIAGNOSIS — R29898 Other symptoms and signs involving the musculoskeletal system: Secondary | ICD-10-CM

## 2015-07-07 DIAGNOSIS — C20 Malignant neoplasm of rectum: Secondary | ICD-10-CM

## 2015-07-07 DIAGNOSIS — Z5111 Encounter for antineoplastic chemotherapy: Secondary | ICD-10-CM | POA: Diagnosis not present

## 2015-07-07 DIAGNOSIS — Z5112 Encounter for antineoplastic immunotherapy: Secondary | ICD-10-CM

## 2015-07-07 DIAGNOSIS — J40 Bronchitis, not specified as acute or chronic: Secondary | ICD-10-CM

## 2015-07-07 DIAGNOSIS — Z720 Tobacco use: Secondary | ICD-10-CM

## 2015-07-07 LAB — COMPREHENSIVE METABOLIC PANEL
ALK PHOS: 109 U/L (ref 38–126)
ALT: 39 U/L (ref 17–63)
AST: 36 U/L (ref 15–41)
Albumin: 3.5 g/dL (ref 3.5–5.0)
Anion gap: 6 (ref 5–15)
BUN: 7 mg/dL (ref 6–20)
CHLORIDE: 100 mmol/L — AB (ref 101–111)
CO2: 30 mmol/L (ref 22–32)
CREATININE: 0.69 mg/dL (ref 0.61–1.24)
Calcium: 8.4 mg/dL — ABNORMAL LOW (ref 8.9–10.3)
GFR calc Af Amer: >60 mL/min (ref >60)
GFR calc non Af Amer: >60 mL/min (ref >60)
GLUCOSE: 127 mg/dL — AB (ref 65–99)
Potassium: 3.6 mmol/L (ref 3.5–5.1)
SODIUM: 136 mmol/L (ref 135–145)
Total Bilirubin: 0.6 mg/dL (ref 0.3–1.2)
Total Protein: 6.6 g/dL (ref 6.5–8.1)

## 2015-07-07 LAB — CBC WITH DIFFERENTIAL/PLATELET
BASOS ABS: 0 10*3/uL (ref 0.0–0.1)
Basophils Relative: 0 %
EOS ABS: 0.3 10*3/uL (ref 0.0–0.7)
EOS PCT: 4 %
HCT: 39.1 % (ref 39.0–52.0)
HEMOGLOBIN: 12.8 g/dL — AB (ref 13.0–17.0)
LYMPHS PCT: 11 %
Lymphs Abs: 0.8 10*3/uL (ref 0.7–4.0)
MCH: 32 pg (ref 26.0–34.0)
MCHC: 32.7 g/dL (ref 30.0–36.0)
MCV: 97.8 fL (ref 78.0–100.0)
Monocytes Absolute: 0.7 10*3/uL (ref 0.1–1.0)
Monocytes Relative: 10 %
NEUTROS PCT: 75 %
Neutro Abs: 5.4 10*3/uL (ref 1.7–7.7)
PLATELETS: 207 10*3/uL (ref 150–400)
RBC: 4 MIL/uL — AB (ref 4.22–5.81)
RDW: 16.3 % — ABNORMAL HIGH (ref 11.5–15.5)
WBC: 7.2 10*3/uL (ref 4.0–10.5)

## 2015-07-07 LAB — URINALYSIS, DIPSTICK ONLY
BILIRUBIN URINE: NEGATIVE
GLUCOSE, UA: NEGATIVE mg/dL
HGB URINE DIPSTICK: NEGATIVE
Leukocytes, UA: NEGATIVE
Nitrite: NEGATIVE
PH: 5.5 (ref 5.0–8.0)
Protein, ur: NEGATIVE mg/dL
SPECIFIC GRAVITY, URINE: 1.02 (ref 1.005–1.030)

## 2015-07-07 MED ORDER — DEXTROSE 5 % IV SOLN
400.0000 mg/m2 | Freq: Once | INTRAVENOUS | Status: AC
  Administered 2015-07-07: 776 mg via INTRAVENOUS
  Filled 2015-07-07: qty 38.8

## 2015-07-07 MED ORDER — BEVACIZUMAB CHEMO INJECTION 400 MG/16ML
5.0000 mg/kg | Freq: Once | INTRAVENOUS | Status: AC
  Administered 2015-07-07: 400 mg via INTRAVENOUS
  Filled 2015-07-07: qty 16

## 2015-07-07 MED ORDER — PREDNISONE 5 MG PO TABS: ORAL_TABLET | ORAL | Status: DC

## 2015-07-07 MED ORDER — ATROPINE SULFATE 1 MG/ML IJ SOLN
0.5000 mg | Freq: Once | INTRAMUSCULAR | Status: AC | PRN
  Administered 2015-07-07: 0.5 mg via INTRAVENOUS
  Filled 2015-07-07: qty 1

## 2015-07-07 MED ORDER — DEXTROSE 5 % IV SOLN
180.0000 mg/m2 | Freq: Once | INTRAVENOUS | Status: AC
  Administered 2015-07-07: 350 mg via INTRAVENOUS
  Filled 2015-07-07: qty 5

## 2015-07-07 MED ORDER — HYDROMORPHONE HCL 4 MG PO TABS: 4.0000 mg | ORAL_TABLET | ORAL | Status: DC | PRN

## 2015-07-07 MED ORDER — FENTANYL 75 MCG/HR TD PT72: 150.0000 ug | MEDICATED_PATCH | TRANSDERMAL | Status: DC

## 2015-07-07 MED ORDER — SODIUM CHLORIDE 0.9% FLUSH
10.0000 mL | INTRAVENOUS | Status: DC | PRN
  Administered 2015-07-07: 10 mL
  Filled 2015-07-07: qty 10

## 2015-07-07 MED ORDER — SODIUM CHLORIDE 0.9 % IV SOLN
2400.0000 mg/m2 | INTRAVENOUS | Status: DC
  Administered 2015-07-07: 4650 mg via INTRAVENOUS
  Filled 2015-07-07: qty 93

## 2015-07-07 MED ORDER — DEXAMETHASONE SODIUM PHOSPHATE 100 MG/10ML IJ SOLN
10.0000 mg | Freq: Once | INTRAMUSCULAR | Status: AC
  Administered 2015-07-07: 10 mg via INTRAVENOUS
  Filled 2015-07-07: qty 1

## 2015-07-07 MED ORDER — SODIUM CHLORIDE 0.9 % IV SOLN
Freq: Once | INTRAVENOUS | Status: AC
  Administered 2015-07-07: 10:00:00 via INTRAVENOUS

## 2015-07-07 MED ORDER — PALONOSETRON HCL INJECTION 0.25 MG/5ML
0.2500 mg | Freq: Once | INTRAVENOUS | Status: AC
  Administered 2015-07-07: 0.25 mg via INTRAVENOUS
  Filled 2015-07-07: qty 5

## 2015-07-07 NOTE — Progress Notes (Signed)
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    09/25/2014 Imaging CT abd/pelvis- Indeterminate nodules within the bilateral lung bases with dominant approximately 1.7 cm centrally necrotic nodule within the right lower lobe...   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   10/03/2014 Tumor Marker CEA NOT ELEVATED   10/09/2014 Imaging CT Chest- Bilateral pulmonary nodules of varying size are most consistent with pulmonary metastasis. There are approximately 6 lesions in each lung.   10/17/2014 Pathology Results Rectum, biopsy - INVASIVE ADENOCARCINOMA   10/29/2014 Pathology Results Diagnosis Lung, needle/core biopsy(ies), right lower lobe - METASTATIC ADENOCARCINOMA   10/29/2014 Pathology Results KRAS wildtype   11/04/2014 - 12/18/2014 Radiation Therapy Dr. Lisbeth Renshaw   11/05/2014 - 05/26/2015 Chemotherapy FOLFOX + Avastin   02/11/2015 Surgery Colostomy formation by Dr. Arnoldo Morale.   02/26/2015 Imaging CT CAP- Interval response to therapy with decrease in size of multiple bilateral pulmonary nodules. No new or progressive pulmonary metastasis.  No evidence for metastatic disease within the abdomen or pelvis.    03/22/2015 Imaging CT RENAL STONE- There is an obstructing 4 mm stone within the distal left ureter resulting in moderate left hydroureteronephrosis.  Re- demonstrated pulmonary nodules within the right lower lobe and lingula, compatible with known metastasis.   05/20/2015 Progression CT Scans demonstrate pulmonary progression of  disease.   05/20/2015 Imaging CT CAP- Mild progression of bilateral pulmonary metastases, including a 1.9 cm cavitary nodule in the right lower lobe with increasing solid component.   06/23/2015 -  Chemotherapy FOLFIRI + Avastin    HISTORY OF PRESENTING ILLNESS:  Andrew Kline 50 y.o. male is here because of stage IV rectal cancer.  He is here alone today. He goes by [name].   [name] returns to the [facility] alone today. He is ready to receive treatment today.  He has to set up an appointment with Dr. Arnoldo Morale today, and will let us know when he does.  He says he's feeling better; that he gained weight again, and that he's doing well.  He looks good today. He says he still has a periodic cough, but it's been when he's laying down. He says "it hurts, oh man does it hurt." When asked if he'd like to do another round of prednisone, he says "I don't know," because it's periodic. He says his cough is really alleviated by a cough drop, but when he doesn't have a cough drop in his mouth, the cough keeps him wide awake and hurts. He notes again that his bronchitis is overall gone except for periodic severe coughing spells.   No nausea, vomiting, excessive output from his ostomy. Appetite is described as excellent.    MEDICAL HISTORY:  Past Medical History  Diagnosis Date  . Rectal pain   . Arthritis   . Renal cell cancer ([place])     2012.   [name] Rectal cancer ([place])   . Shortness of breath dyspnea   . Anxiety   . Depression   . Kidney stone 03/22/15    left    SURGICAL HISTORY: Past Surgical  History  Procedure Laterality Date  . Hypospadias correction    . Laparoscopic partial nephrectomy  2012    right side  . Knee surgery Left     arthroscopy  . Flexible sigmoidoscopy N/A 09/30/2014    Procedure: FLEXIBLE SIGMOIDOSCOPY;  Surgeon: Danie Binder, MD;  Location: [facility];  Service: Endoscopy;  Laterality: N/A;  . Biopsy N/A 09/30/2014    Procedure: BIOPSY;  Surgeon: Danie Binder, MD;   Location: [facility]S;  Service: Endoscopy;  Laterality: N/A;  Anal Canal  . Flexible sigmoidoscopy N/A 10/17/2014    Procedure: FLEXIBLE SIGMOIDOSCOPY;  Surgeon: Danie Binder, MD;  Location: [facility] END[facility] SUITE;  Service: Endoscopy;  Laterality: N/A;  1325  . Lung biopsy Right   . Colostomy N/A 02/11/2015    Procedure: COLOSTOMY;  Surgeon: Aviva Signs, MD;  Location: [facility] ORS;  Service: General;  Laterality: N/A;    SOCIAL HISTORY: Social History   Social History  . Marital Status: Divorced    Spouse Name: N/A  . Number of Children: 2  . Years of Education: N/A   Occupational History  . retail    Social History Main Topics  . Smoking status: Current Every Day Smoker -- 0.50 packs/day for 30 years    Types: Cigarettes  . Smokeless tobacco: Not on file     Comment: a little over a pack daily  . Alcohol Use: 0.0 oz/week    0 Standard drinks or equivalent per week     Comment: Occ  . Drug Use: No  . Sexual Activity: Yes    Birth Control/ Protection: None   Other Topics Concern  . Not on file   Social History Narrative  He is from [place], [place]. Moved here in March. Smoker, 1 ppd. Was 1.5 ppd. ETOH, none. 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 He is divorced. Lives with fiance. 2 children. 22 and 41 yo. No grandchildren.  FAMILY HISTORY: Family History  Problem Relation Age of Onset  . Lung cancer Maternal Grandfather   . Colon cancer Maternal Uncle     age 31  . Diabetes Other   . Healthy Mother    indicated that his mother is alive. He indicated that his father is alive.   Mother living, 22 yo. Still working as a Audiological scientist. Father living, 53-70 yo. 1 brother, 1 sister, 1 step sister. Maternal uncle has colon cancer. He is married to a GI nurse. So he has people to go to with questions and is informed on what is happening. History of diabetes in the family. No history of  heart disease in the family.  ALLERGIES:  is allergic to oxaliplatin.  MEDICATIONS:  Current Outpatient Prescriptions  Medication Sig Dispense Refill  . albuterol (PROVENTIL HFA;VENTOLIN HFA) 108 (90 Base) MCG/ACT inhaler Inhale 2 puffs into the lungs every 6 (six) hours as needed for wheezing or shortness of breath. 1 Inhaler 2  . amitriptyline (ELAVIL) 50 MG tablet Take 1 tablet (50 mg total) by mouth at bedtime. 30 tablet 2  . Bevacizumab (AVASTIN IV) Inject into the vein every 14 (fourteen) days. To start November 19, 2014    . citalopram (CELEXA) 40 MG tablet Take 1 tablet (40 mg total) by mouth daily. 30 tablet 1  . dextrose 5 % SOLN 1,000 mL with fluorouracil 5 GM/100ML SOLN Inject into the vein every 14 (fourteen) days. To start November 05, 2014. To infuse over 46 hours via pump.    . DULoxetine (CYMBALTA) 30 MG capsule Take one capsule daily for 5 days then 2 daily thereafter (Patient taking differently: Take 30 mg by mouth daily. ) 60 capsule 3  . fentaNYL (DURAGESIC - DOSED MCG/HR) 75 MCG/HR Place 2 patches (150 mcg total) onto the skin every 3 (three) days. 20 patch 0  . HYDROmorphone (DILAUDID) 4 MG tablet Take 1 tablet (4 mg total) by mouth every 4 (four) hours as needed for severe pain. 60 tablet 0  . IRINOTECAN HCL IV Inject into the vein. Reported on 06/09/2015    . leucovorin 50 MG injection Inject into the vein every 14 (fourteen) days. To start November 05, 2014    . lidocaine-hydrocortisone (ANAMANTLE) 3-1 % KIT Place 1 application rectally 2 (two) times daily. For two weeks, alternate with Rectiv. 28 each 1  . lidocaine-prilocaine (EMLA) cream Apply a quarter size amount to port site 1 hour prior to chemo. Do not rub in. Cover with plastic wrap. 30 g 3  . nitroGLYCERIN (NITROGLYN) 2 % ointment Apply 0.5 inches topically daily. Anal area    . ondansetron (ZOFRAN ODT) 4 MG disintegrating tablet 64m ODT q4 hours prn nausea/vomit 20 tablet 0  . ondansetron (ZOFRAN) 4 mg TABS tablet Take 4  tablets by mouth every 8 (eight) hours as needed. 4 tablet 0  . potassium chloride SA (K-DUR,KLOR-CON) 20 MEQ tablet Take 1 tablet (20 mEq total) by mouth 2 (two) times daily. 30 tablet 0  . predniSONE (DELTASONE) 5 MG tablet 4 tablets daily for 3 days, 3 tablets for 3 days, 2 tablets for 3 days 1 tablet for 3 days then off 30 tablet 0  . pregabalin (LYRICA) 150 MG capsule Take 1 capsule (150 mg total) by mouth 2 (two) times daily. 60 capsule 3  . prochlorperazine (COMPAZINE) 10 MG tablet Take 1 tablet (10 mg total) by mouth every 6 (six) hours as needed for nausea or vomiting. 60 tablet 0  . zolpidem (AMBIEN) 10 MG tablet Take 1 tablet (10 mg total) by mouth at bedtime. 30 tablet 2  . benzonatate (TESSALON) 200 MG capsule Take 1 capsule (200 mg total) by mouth 3 (three) times daily as needed for cough. (Patient not taking: Reported on 07/07/2015) 45 capsule 1  . docusate sodium (COLACE) 100 MG capsule Take 1 capsule (100 mg total) by mouth every 12 (twelve) hours. 60 capsule 0   No current facility-administered medications for this visit.   Facility-Administered Medications Ordered in Other Visits  Medication Dose Route Frequency Provider Last Rate Last Dose  . 0.9 %  sodium chloride infusion   Intravenous Continuous SPatrici Ranks MD        Review of Systems  Constitutional: Positive for malaise/fatigue Fatigue with treatment. HENT: Negative. Respiratory: Negative. Gastrointestinal: Positive for rectal pain, diarrhea Negative for blood in stool. Occasional rectal discharge  Diarrhea with treatment and was resolved with Kaopectate Genitourinary: Negative Psychiatric/Behavioral: Positive for ANXIETY.  14 point ROS was done and is otherwise as detailed above or in HPI    PHYSICAL EXAMINATION:  ECOG PERFORMANCE STATUS: 1 - Symptomatic but completely ambulatory Vitals with BMI 07/02/2015  Height   Weight 179 lbs  BMI   Systolic 1263 Diastolic 81  Pulse 89  Respirations 20     Physical Exam  Constitutional: He is oriented to person, place, and time and well-developed, well-nourished, and in no distress.  HENT:  Head: Normocephalic and  atraumatic.  Nose: Nose normal.  Mouth/Throat: Oropharynx is clear and moist. No oropharyngeal exudate.  Dentures on top.  Eyes: Conjunctivae and EOM are normal. Pupils are equal, round, and reactive to light. Right eye exhibits no discharge. Left eye exhibits no discharge. No scleral icterus.  Neck: Normal range of motion. Neck supple. No tracheal deviation present. No thyromegaly present.  Cardiovascular: Normal rate, regular rhythm and normal heart sounds.  Exam reveals no gallop and no friction rub.   No murmur heard. Pulmonary/Chest: Normal Abdominal: Soft. Bowel sounds are normal. He exhibits no distension and no mass. There is no tenderness. There is no rebound and no guarding. Ostomy is c/d/i  Genitourinary:   Musculoskeletal: Normal range of motion. He exhibits no edema.  Lymphadenopathy:    He has no cervical adenopathy.  Neurological: He is alert and oriented to person, place, and time. He has normal reflexes. No cranial nerve deficit. Gait normal. Coordination normal.  Skin: Skin is warm and dry. No rash noted.  Psychiatric: Mood, memory, affect and judgment normal.  Nursing note and vitals reviewed.   LABORATORY DATA:  I have reviewed the data as listed Lab Results  Component Value Date   WBC 6.7 06/23/2015   HGB 14.8 06/23/2015   HCT 45.3 06/23/2015   MCV 97.6 06/23/2015   PLT 240 06/23/2015   CMP     Component Value Date/Time   NA 139 06/23/2015 0938   K 3.6 06/23/2015 0938   CL 106 06/23/2015 0938   CO2 24 06/23/2015 0938   GLUCOSE 188* 06/23/2015 0938   BUN 7 06/23/2015 0938   CREATININE 0.74 06/23/2015 0938   CALCIUM 8.8* 06/23/2015 0938   PROT 7.1 06/23/2015 0938   ALBUMIN 3.5 06/23/2015 0938   AST 21 06/23/2015 0938   ALT 13* 06/23/2015 0938   ALKPHOS 98 06/23/2015 0938   BILITOT 0.3  06/23/2015 0938   GFRNONAA >60 06/23/2015 0938   GFRAA >60 06/23/2015 4268    RADIOLOGY: I have personally reviewed the radiological images as listed and agreed with the findings in the report. CLINICAL DATA: Rectal cancer with metastatic disease to lung. Cough and short of breath  EXAM: CHEST 2 VIEW  COMPARISON: Chest two views 10/31/2014. Chest CT 05/20/2015  FINDINGS: Port-A-Cath tip in the SVC unchanged. Heart size and vascularity normal. Negative for pneumonia or effusion.  Small lung nodules are best seen on the recent CT scan. Cavitary nodule right posterior lung base unchanged from the recent CT.  IMPRESSION: Cavitary nodule right lower lobe as noted on CT. Other small nodules best seen on CT.   Electronically Signed  By: Franchot Gallo M.D.  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 Arnoldo Morale in consultation and they have discussed additional surgery. He is interested especially if it can resolve his rectal pain.  I reminded him that timing is important if he proceeds because he is on Avastin.  Mr. Panek will receive treatment today Cycle #2 of FOLFIRI/AVASTIN. I will see him back in 2 weeks for treatment and try to hold his Avastin according to the timing of his surgery. He will let us know if  he wants to do the prednisone for ongoing bronchospasm.  He needs a refill on his dilaudid, I have provided this for him today as well.  This document serves as a record of services personally performed by Ancil Linsey, MD. It was created on her  behalf by Toni Amend, a trained medical scribe. The creation of this record is based on the scribe's personal observations and the provider's statements to them. This document has been checked and approved by the attending provider.  I have reviewed the above documentation for accuracy and completeness, and I agree with the above.   This note was electronically signed.   Kelby Fam. [name], MD

## 2015-07-07 NOTE — Patient Instructions (Addendum)
..  [place] at [facility] Discharge Instructions  RECOMMENDATIONS MADE BY THE CONSULTANT AND ANY TEST RESULTS WILL BE SENT TO YOUR REFERRING PHYSICIAN. Exam per Dr. [name]  Treatment today We will see you back in 2 weeks- We may hold your avastin at that time depending on surgery date  If your cough worsens and you fill you need another round of prednisone please let us know   Prescriptions given for dilaudid and fentanyl patches  Prednisone refill to your drug store  Thank you for choosing [place] at [facility] to provide your oncology and hematology care.  To afford each patient quality time with our provider, please arrive at least 15 minutes before your scheduled appointment time.   Beginning January 23rd 2017 lab work for the [facility] will be done in the  Main lab at [facility] on 1st floor. If you have a lab appointment with the [facility] please come in thru the  Main Entrance and check in at the main information desk  You need to re-schedule your appointment should you arrive 10 or more minutes late.  We strive to give you quality time with our providers, and arriving late affects you and other patients whose appointments are after yours.  Also, if you no show three or more times for appointments you may be dismissed from the clinic at the providers discretion.     Again, thank you for choosing [facility].  Our hope is that these requests will decrease the amount of time that you wait before being seen by our physicians.       _____________________________________________________________  Should you have questions after your visit to [facility], please contact our office at [phone] between the hours of 8:30 a.m. and 4:30 p.m.  Voicemails left after 4:30 p.m. will not be returned until the following business day.  For prescription refill requests, have your pharmacy contact our office.          Resources For Cancer Patients and their Caregivers ? American Cancer Society: Can assist with transportation, wigs, general needs, runs Look Good Feel Better.        (902)420-4337 ? Cancer Care: Provides financial assistance, online support groups, medication/co-pay assistance.  1-800-813-HOPE 754-355-6307) ? [place] Assists [place] cancer patients and their families through emotional , educational and financial support.  760 528 7789 ? [place] DSS Where to apply for food stamps, Medicaid and utility assistance. (715) 845-1692 ? RCATS: Transportation to medical appointments. 646-506-7636 ? Social Security Administration: May apply for disability if have a Stage IV cancer. (301)447-2978 5097302707 ? [employer], Disability and Transit Services: Assists with nutrition, care and transit needs. 9032534091

## 2015-07-07 NOTE — Progress Notes (Signed)
Tolerated chemo well. Continuous infusion pump intact. Ambulatory on discharge home to self.

## 2015-07-07 NOTE — Progress Notes (Signed)
[place] Clinical Social Work  Clinical Social Work was referred by [place] rounding for re-assessment of psychosocial needs due to previous concerns. Patient shared he is very interested in starting PT in order to get stronger. This is probably a great plan for him. CSW will share this idea with the medical team. Pt has been managing ok, but is open to support programs and plans to attend upcoming event at [employer]. CSW encouraged pt to consider other programs as well. Pt interested in completing ADRs and will bring packet to have CSW notarize at next appt on the 14th. Pt agrees to reach out as needed.     Clinical Social Work interventions: Re-assess needs Supportive listening  [place], [place] Tuesdays   [phone]

## 2015-07-09 ENCOUNTER — Encounter (HOSPITAL_BASED_OUTPATIENT_CLINIC_OR_DEPARTMENT_OTHER): Payer: Medicaid Other

## 2015-07-09 VITALS — BP 145/77 | HR 96 | Temp 97.7°F | Resp 18 | Wt 183.4 lb

## 2015-07-09 DIAGNOSIS — C7800 Secondary malignant neoplasm of unspecified lung: Secondary | ICD-10-CM | POA: Diagnosis not present

## 2015-07-09 DIAGNOSIS — C20 Malignant neoplasm of rectum: Secondary | ICD-10-CM | POA: Diagnosis not present

## 2015-07-09 DIAGNOSIS — Z452 Encounter for adjustment and management of vascular access device: Secondary | ICD-10-CM | POA: Diagnosis present

## 2015-07-09 MED ORDER — HEPARIN SOD (PORK) LOCK FLUSH 100 UNIT/ML IV SOLN
500.0000 [IU] | Freq: Once | INTRAVENOUS | Status: AC | PRN
  Administered 2015-07-09: 500 [IU]

## 2015-07-09 MED ORDER — SODIUM CHLORIDE 0.9% FLUSH
10.0000 mL | INTRAVENOUS | Status: DC | PRN
  Administered 2015-07-09: 10 mL
  Filled 2015-07-09: qty 10

## 2015-07-09 MED ORDER — HEPARIN SOD (PORK) LOCK FLUSH 100 UNIT/ML IV SOLN
INTRAVENOUS | Status: AC
  Filled 2015-07-09: qty 5

## 2015-07-09 NOTE — Patient Instructions (Signed)
[facility] Discharge Instructions for Patients Receiving Chemotherapy   Beginning January 23rd 2017 lab work for the [facility] will be done in the  Main lab at [facility] on 1st floor. If you have a lab appointment with the [facility] please come in thru the  Main Entrance and check in at the main information desk   Today you received the following chemotherapy agent: 88f home infusion.     If you develop nausea and vomiting, or diarrhea that is not controlled by your medication, call the clinic.  The clinic phone number is [phone] Office hours are Monday-Friday 8:30am-5:00pm.  BELOW ARE SYMPTOMS THAT SHOULD BE REPORTED IMMEDIATELY:  *FEVER GREATER THAN 101.0 F  *CHILLS WITH OR WITHOUT FEVER  NAUSEA AND VOMITING THAT IS NOT CONTROLLED WITH YOUR NAUSEA MEDICATION  *UNUSUAL SHORTNESS OF BREATH  *UNUSUAL BRUISING OR BLEEDING  TENDERNESS IN MOUTH AND THROAT WITH OR WITHOUT PRESENCE OF ULCERS  *URINARY PROBLEMS  *BOWEL PROBLEMS  UNUSUAL RASH Items with * indicate a potential emergency and should be followed up as soon as possible. If you have an emergency after office hours please contact your primary care physician or go to the nearest emergency department.  Please call the clinic during office hours if you have any questions or concerns.   You may also contact the Patient Navigator at [phone] you have any questions or need assistance in obtaining follow up care.      Resources For Cancer Patients and their Caregivers ? American Cancer Society: Can assist with transportation, wigs, general needs, runs Look Good Feel Better.        1832-654-1964? Cancer Care: Provides financial assistance, online support groups, medication/co-pay assistance.  1-800-813-HOPE (902-748-4960 ? [facility] [place] cancer patients and their families through emotional , educational and financial support.   3(559)884-8859? [place] DSS Where to apply for food stamps, Medicaid and utility assistance. 32056735522? RCATS: Transportation to medical appointments. 3(864)778-3856? Social Security Administration: May apply for disability if have a Stage IV cancer. 3279 117 832117747763083? [employer] Disability and Transit Services: Assists with nutrition, care and transit needs. 3516-367-8405

## 2015-07-09 NOTE — Progress Notes (Signed)
Patient tolerated home infusion well.  VSS.  Port de-accessed per protocol.

## 2015-07-14 ENCOUNTER — Inpatient Hospital Stay (HOSPITAL_COMMUNITY): Payer: Medicaid Other

## 2015-07-16 ENCOUNTER — Encounter (HOSPITAL_COMMUNITY): Payer: Medicaid Other

## 2015-07-20 NOTE — Assessment & Plan Note (Addendum)
Stage IV rectal cancer, treated with systemic chemotherapy consisting of FOLFOX + Avastin from 11/05/2014- 05/26/2015 with CT imaging demonstrating progression of disease in pulmonary lesions, therefore a change in therapy to FOLFIRI + Avastin began on 06/23/2015.    Oncology history is updated.  His pain regimen consists of Dilaudid, Fentanyl and Lyrica.  Lyrica has been very helpful in his pain management.  He requests a refill on his Dilaudid, Fentanyl, and Lyrica and this is completed today.  He is currently on Fentanyl 150 mcg/hr, Dilaudid 4 mg PRN, and Lyrica 150 mg daily.   He continues with a cough. He notes episodic in nature. Unfortunately, is causing increased rectal discomfort and bothering his colostomy more. I will give him a prednisone pulse without a taper 20 mg daily for 5 days. We'll see if this is helpful. Cough is productive of clear sputum. He is afebrile, denies any fevers at home, and denies any shaking chills.  He notes that he is seeing Dr. Arnoldo Morale this Thursday, 07/23/2015, for consultation regarding surgical intervention. Hopefully this will help with his pain. Avastin has been held for this cycle and the previous cycle earlier in March. We'll continue to hold Avastin following surgery for approximately one month to allow for healing.  He will return in 2 weeks tentatively for labs and possible treatment depending on surgical plan.

## 2015-07-20 NOTE — Progress Notes (Signed)
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  INTERVAL HISTORY: Andrew Kline 50 y.o. male returns for followup of Stage IV rectal cancer, KRAS wild-type.    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    09/25/2014 Imaging CT abd/pelvis- Indeterminate nodules within the bilateral lung bases with dominant approximately 1.7 cm centrally necrotic nodule within the right lower lobe...   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   10/03/2014 Tumor Marker CEA NOT ELEVATED   10/09/2014 Imaging CT Chest- Bilateral pulmonary nodules of varying size are most consistent with pulmonary metastasis. There are approximately 6 lesions in each lung.   10/17/2014 Pathology Results Rectum, biopsy - INVASIVE ADENOCARCINOMA   10/29/2014 Pathology Results Diagnosis Lung, needle/core biopsy(ies), right lower lobe - METASTATIC ADENOCARCINOMA   10/29/2014 Pathology Results KRAS wildtype   11/04/2014 - 12/18/2014 Radiation Therapy Dr. Lisbeth Renshaw   11/05/2014 - 05/26/2015 Chemotherapy FOLFOX + Avastin   02/11/2015 Surgery Colostomy formation by Dr. Arnoldo Morale.   02/26/2015 Imaging CT CAP- Interval response to therapy with decrease in size of multiple bilateral pulmonary nodules. No new or progressive pulmonary metastasis.  No evidence for metastatic disease within the abdomen or pelvis.    03/22/2015 Imaging CT RENAL STONE- There is an obstructing 4 mm stone within the distal left ureter resulting in moderate left hydroureteronephrosis.  Re-  demonstrated pulmonary nodules within the right lower lobe and lingula, compatible with known metastasis.   05/20/2015 Progression CT Scans demonstrate pulmonary progression of disease.   05/20/2015 Imaging CT CAP- Mild progression of bilateral pulmonary metastases, including a 1.9 cm cavitary nodule in the right lower lobe with increasing solid component.   06/23/2015 -  Chemotherapy FOLFIRI + Avastin    I personally reviewed and went over laboratory results with the patient.  The results are noted within this dictation.  CEA has been stable and will be updated today.Labs are pending at the time of today's dictation. Depending on lab results, he will get chemotherapy today as planned.  He continues with an episode a cough that is causing more rectal discomfort in addition to increased hernia with colostomy. No change in pain medications today. His cough is episodic and unpredictable. He notes that is getting worse compared to 2 weeks ago when he was last examined. He denies any fevers at home. He denies any shaking chills. It is productive of clear sputum.  He has an upcoming appointment with Dr. Arnoldo Morale for consultation regarding surgical intervention.   Past Medical History  Diagnosis Date  . Rectal pain   . Arthritis   . Renal cell cancer ([place])     2012.   [name] Rectal cancer ([place])   . Shortness of breath dyspnea   . Anxiety   . Depression   . 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  is allergic to oxaliplatin.  Current Outpatient Prescriptions on File Prior to Visit  Medication Sig Dispense Refill  . albuterol (PROVENTIL HFA;VENTOLIN HFA) 108 (90 Base) MCG/ACT inhaler Inhale 2 puffs into the lungs every 6 (six) hours as needed for wheezing or shortness of  breath. 1 Inhaler 2  . Bevacizumab (AVASTIN IV) Inject into the vein every 14 (fourteen) days. To start November 19, 2014    . citalopram (CELEXA) 40 MG tablet Take 1 tablet (40 mg total) by mouth daily. 30 tablet 1  . dextrose 5 % SOLN 1,000 mL with fluorouracil 5 GM/100ML SOLN Inject into the vein every 14 (fourteen) days. To start November 05, 2014. To infuse over 46 hours via pump.    [name] docusate sodium (COLACE) 100 MG capsule Take 1 capsule (100 mg total) by mouth every 12 (twelve) hours. 60 capsule 0  . DULoxetine (CYMBALTA) 30 MG capsule Take one capsule daily for 5 days then 2 daily thereafter (Patient taking differently: Take 30 mg by mouth daily. ) 60 capsule 3  . fentaNYL (DURAGESIC - DOSED MCG/HR) 75 MCG/HR Place 2 patches (150 mcg total) onto the skin every 3 (three) days. 20 patch 0  . IRINOTECAN HCL IV Inject into the vein. Reported on 06/09/2015    . leucovorin 50 MG injection Inject into the vein every 14 (fourteen) days. To start November 05, 2014    . lidocaine-hydrocortisone (ANAMANTLE) 3-1 % KIT Place 1 application rectally 2 (two) times daily. For two weeks, alternate with Rectiv. 28 each 1  . lidocaine-prilocaine (EMLA) cream Apply a quarter size amount to port site 1 hour prior to chemo. Do not rub in. Cover with plastic wrap. 30 g 3  . nitroGLYCERIN (NITROGLYN) 2 % ointment Apply 0.5 inches topically daily. Anal area    . ondansetron (ZOFRAN ODT) 4 MG disintegrating tablet 75m ODT q4 hours prn nausea/vomit 20 tablet 0  . ondansetron (ZOFRAN) 4 mg TABS tablet Take 4 tablets by mouth every 8 (eight) hours as needed. 4 tablet 0  . potassium chloride SA (K-DUR,KLOR-CON) 20 MEQ tablet Take 1 tablet (20 mEq total) by mouth 2 (two) times daily. 30 tablet 0  . pregabalin (LYRICA) 150 MG capsule Take 1 capsule (150 mg total) by mouth 2 (two) times daily. 60 capsule 3  . prochlorperazine (COMPAZINE) 10 MG tablet Take 1 tablet (10 mg total) by mouth every 6 (six) hours as needed for nausea or vomiting.  60 tablet 0  . zolpidem (AMBIEN) 10 MG tablet Take 1 tablet (10 mg total) by mouth at bedtime. 30 tablet 2  . amitriptyline (ELAVIL) 50 MG tablet Take 1 tablet (50 mg total) by mouth at bedtime. 30 tablet 2  . benzonatate (TESSALON) 200 MG capsule Take 1 capsule (200 mg total) by mouth 3 (three) times daily as needed for cough. (Patient not taking: Reported on 07/07/2015) 45 capsule 1   Current Facility-Administered Medications on File Prior to Visit  Medication Dose Route Frequency Provider Last Rate Last Dose  . 0.9 %  sodium chloride infusion   Intravenous Continuous SPatrici Ranks MD      . fluorouracil (ADRUCIL) 4,650 mg in sodium chloride 0.9 % 57 mL chemo infusion  2,400 mg/m2 (Treatment Plan Actual) Intravenous 1 day or 1 dose SPatrici Ranks MD   4,650 mg at 07/21/15 1412  . sodium chloride flush (NS) 0.9 % injection 10 mL  10 mL Intracatheter PRN TBaird Cancer PA-C   10 mL at 07/21/15 1033  Past Surgical History  Procedure Laterality Date  . Hypospadias correction    . Laparoscopic partial nephrectomy  2012    right side  . Knee surgery Left     arthroscopy  . Flexible sigmoidoscopy N/A 09/30/2014    Procedure: FLEXIBLE SIGMOIDOSCOPY;  Surgeon: Danie Binder, MD;  Location: [facility];  Service: Endoscopy;  Laterality: N/A;  . Biopsy N/A 09/30/2014    Procedure: BIOPSY;  Surgeon: Danie Binder, MD;  Location: [facility];  Service: Endoscopy;  Laterality: N/A;  Anal Canal  . Flexible sigmoidoscopy N/A 10/17/2014    Procedure: FLEXIBLE SIGMOIDOSCOPY;  Surgeon: Danie Binder, MD;  Location: [facility] END[facility] SUITE;  Service: Endoscopy;  Laterality: N/A;  1325  . Lung biopsy Right   . Colostomy N/A 02/11/2015    Procedure: COLOSTOMY;  Surgeon: Aviva Signs, MD;  Location: [facility];  Service: General;  Laterality: N/A;    Denies any headaches, dizziness, double vision, fevers, chills, night sweats, nausea, vomiting, diarrhea, constipation, chest pain, heart palpitations, shortness of  breath, blood in stool, black tarry stool, urinary pain, urinary burning, urinary frequency, hematuria.   PHYSICAL EXAMINATION  ECOG PERFORMANCE STATUS: 1 - Symptomatic but completely ambulatory  Filed Vitals:   07/21/15 0905  BP: 158/86  Pulse: 71  Temp: 98 F (36.7 C)  Resp: 18    GENERAL:alert, no distress, well nourished, well developed, cooperative and tearful, unaccompanied, and in a chemotherapy bed.  Voice is gone and therefore talking in a whisper tone SKIN: skin color, texture, turgor are normal, no rashes or significant lesions HEAD: Normocephalic, No masses, lesions, tenderness or abnormalities EYES: normal, EOMI, Conjunctiva are pink and non-injected EARS: External exam is normal OROPHARYNX:lips, buccal mucosa, and tongue normal and mucous membranes are moist , poor dentition NECK: supple, trachea midline LYMPH:  no palpable lymphadenopathy BREAST:not examined LUNGS: clear to auscultation without any wheezes, rales, or rhonchi HEART: regular rate & rhythm, without murmur, rub, or gallop. ABDOMEN:abdomen soft, normal bowel sounds and colostomy producing soft stool appropriately.  One episode of discomfort during examination. BACK: Back symmetric, no curvature., No CVA tenderness EXTREMITIES:less then 2 second capillary refill, no joint deformities, effusion, or inflammation, no skin discoloration, no cyanosis  NEURO: alert & oriented x 3 with fluent speech, no focal motor/sensory deficits.  LABORATORY DATA: CBC    Component Value Date/Time   WBC 6.9 07/21/2015 1000   RBC 4.20* 07/21/2015 1000   HGB 13.6 07/21/2015 1000   HCT 41.7 07/21/2015 1000   PLT 247 07/21/2015 1000   MCV 99.3 07/21/2015 1000   MCH 32.4 07/21/2015 1000   MCHC 32.6 07/21/2015 1000   RDW 17.2* 07/21/2015 1000   LYMPHSABS 0.8 07/21/2015 1000   MONOABS 0.8 07/21/2015 1000   EOSABS 0.2 07/21/2015 1000   BASOSABS 0.0 07/21/2015 1000      Chemistry      Component Value Date/Time   NA 139  07/21/2015 1000   K 3.6 07/21/2015 1000   CL 105 07/21/2015 1000   CO2 26 07/21/2015 1000   BUN 5* 07/21/2015 1000   CREATININE 0.73 07/21/2015 1000      Component Value Date/Time   CALCIUM 8.5* 07/21/2015 1000   ALKPHOS 104 07/21/2015 1000   AST 19 07/21/2015 1000   ALT 19 07/21/2015 1000   BILITOT 0.5 07/21/2015 1000     Lab Results  Component Value Date   CEA 3.6 06/23/2015     PENDING LABS:   RADIOGRAPHIC STUDIES:  No results found.  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    Oncology history is updated.  His pain regimen consists of Dilaudid, Fentanyl and Lyrica.  Lyrica has been very helpful in his pain management.  He requests a refill on his Dilaudid, Fentanyl, and Lyrica and this is completed today.  He is currently on Fentanyl 150 mcg/hr, Dilaudid 4 mg PRN, and Lyrica 150 mg daily.   He continues with a cough. He notes episodic in nature. Unfortunately, is causing increased rectal discomfort and bothering his colostomy more. I will give him a prednisone pulse without a taper 20 mg daily for 5 days. We'll see if this is helpful. Cough is productive of clear sputum. He is afebrile, denies any fevers at home, and denies any shaking chills.  He notes that he is seeing Dr. Arnoldo Morale this Thursday, 07/23/2015, for consultation regarding surgical intervention. Hopefully this will help with his pain. Avastin has been held for this cycle and the previous cycle earlier in March. We'll continue to hold Avastin following surgery for approximately one month to allow for healing.  He will return in 2 weeks tentatively for labs and possible treatment depending on surgical plan.   THERAPY  PLAN:  Continue treatment as planned monitoring for toxicities and intolerances.  Additionally, we will continue to monitor for progressive disease that would require a change in therapy to third line option(s).   All questions were answered. The patient knows to call the clinic with any problems, questions or concerns. We can certainly see the patient much sooner if necessary.  Patient and plan discussed with Dr. Ancil Linsey and she is in agreement with the aforementioned.   This note is electronically signed by: Doy Mince 07/21/2015 2:44 PM

## 2015-07-21 ENCOUNTER — Encounter (HOSPITAL_COMMUNITY): Payer: Self-pay | Admitting: Oncology

## 2015-07-21 ENCOUNTER — Encounter (HOSPITAL_BASED_OUTPATIENT_CLINIC_OR_DEPARTMENT_OTHER): Payer: Medicaid Other

## 2015-07-21 ENCOUNTER — Encounter (HOSPITAL_BASED_OUTPATIENT_CLINIC_OR_DEPARTMENT_OTHER): Payer: Medicaid Other | Admitting: Oncology

## 2015-07-21 VITALS — BP 158/86 | HR 71 | Temp 98.0°F | Resp 18 | Wt 179.9 lb

## 2015-07-21 VITALS — BP 149/75 | HR 75 | Temp 97.6°F | Resp 18

## 2015-07-21 DIAGNOSIS — C7802 Secondary malignant neoplasm of left lung: Secondary | ICD-10-CM | POA: Diagnosis not present

## 2015-07-21 DIAGNOSIS — C7800 Secondary malignant neoplasm of unspecified lung: Principal | ICD-10-CM

## 2015-07-21 DIAGNOSIS — J40 Bronchitis, not specified as acute or chronic: Secondary | ICD-10-CM

## 2015-07-21 DIAGNOSIS — C20 Malignant neoplasm of rectum: Secondary | ICD-10-CM

## 2015-07-21 DIAGNOSIS — K6289 Other specified diseases of anus and rectum: Secondary | ICD-10-CM | POA: Diagnosis not present

## 2015-07-21 DIAGNOSIS — C7801 Secondary malignant neoplasm of right lung: Secondary | ICD-10-CM

## 2015-07-21 DIAGNOSIS — R05 Cough: Secondary | ICD-10-CM | POA: Diagnosis not present

## 2015-07-21 DIAGNOSIS — R918 Other nonspecific abnormal finding of lung field: Secondary | ICD-10-CM | POA: Diagnosis not present

## 2015-07-21 DIAGNOSIS — C787 Secondary malignant neoplasm of liver and intrahepatic bile duct: Secondary | ICD-10-CM | POA: Diagnosis not present

## 2015-07-21 DIAGNOSIS — Z5111 Encounter for antineoplastic chemotherapy: Secondary | ICD-10-CM

## 2015-07-21 LAB — COMPREHENSIVE METABOLIC PANEL WITH GFR
ALT: 19 U/L (ref 17–63)
AST: 19 U/L (ref 15–41)
Albumin: 3.5 g/dL (ref 3.5–5.0)
Alkaline Phosphatase: 104 U/L (ref 38–126)
Anion gap: 8 (ref 5–15)
BUN: 5 mg/dL — ABNORMAL LOW (ref 6–20)
CO2: 26 mmol/L (ref 22–32)
Calcium: 8.5 mg/dL — ABNORMAL LOW (ref 8.9–10.3)
Chloride: 105 mmol/L (ref 101–111)
Creatinine, Ser: 0.73 mg/dL (ref 0.61–1.24)
GFR calc Af Amer: >60 mL/min
GFR calc non Af Amer: >60 mL/min
Glucose, Bld: 143 mg/dL — ABNORMAL HIGH (ref 65–99)
Potassium: 3.6 mmol/L (ref 3.5–5.1)
Sodium: 139 mmol/L (ref 135–145)
Total Bilirubin: 0.5 mg/dL (ref 0.3–1.2)
Total Protein: 7 g/dL (ref 6.5–8.1)

## 2015-07-21 LAB — CBC WITH DIFFERENTIAL/PLATELET
BASOS ABS: 0 10*3/uL (ref 0.0–0.1)
BASOS PCT: 0 %
EOS ABS: 0.2 10*3/uL (ref 0.0–0.7)
Eosinophils Relative: 3 %
HCT: 41.7 % (ref 39.0–52.0)
Hemoglobin: 13.6 g/dL (ref 13.0–17.0)
Lymphocytes Relative: 12 %
Lymphs Abs: 0.8 10*3/uL (ref 0.7–4.0)
MCH: 32.4 pg (ref 26.0–34.0)
MCHC: 32.6 g/dL (ref 30.0–36.0)
MCV: 99.3 fL (ref 78.0–100.0)
MONO ABS: 0.8 10*3/uL (ref 0.1–1.0)
MONOS PCT: 11 %
NEUTROS ABS: 5.1 10*3/uL (ref 1.7–7.7)
Neutrophils Relative %: 73 %
PLATELETS: 247 10*3/uL (ref 150–400)
RBC: 4.2 MIL/uL — ABNORMAL LOW (ref 4.22–5.81)
RDW: 17.2 % — AB (ref 11.5–15.5)
WBC: 6.9 10*3/uL (ref 4.0–10.5)

## 2015-07-21 MED ORDER — LEUCOVORIN CALCIUM INJECTION 350 MG
400.0000 mg/m2 | Freq: Once | INTRAVENOUS | Status: AC
  Administered 2015-07-21: 776 mg via INTRAVENOUS
  Filled 2015-07-21: qty 38.8

## 2015-07-21 MED ORDER — PREDNISONE 5 MG PO TABS: 20.0000 mg | ORAL_TABLET | Freq: Every day | ORAL | Status: DC

## 2015-07-21 MED ORDER — SODIUM CHLORIDE 0.9% FLUSH
10.0000 mL | INTRAVENOUS | Status: DC | PRN
  Administered 2015-07-21: 10 mL
  Filled 2015-07-21: qty 10

## 2015-07-21 MED ORDER — SODIUM CHLORIDE 0.9 % IV SOLN
10.0000 mg | Freq: Once | INTRAVENOUS | Status: AC
  Administered 2015-07-21: 10 mg via INTRAVENOUS
  Filled 2015-07-21: qty 1

## 2015-07-21 MED ORDER — PALONOSETRON HCL INJECTION 0.25 MG/5ML
INTRAVENOUS | Status: AC
  Filled 2015-07-21: qty 5

## 2015-07-21 MED ORDER — SODIUM CHLORIDE 0.9 % IV SOLN
Freq: Once | INTRAVENOUS | Status: AC
  Administered 2015-07-21: 11:00:00 via INTRAVENOUS

## 2015-07-21 MED ORDER — HYDROMORPHONE HCL 4 MG PO TABS: 4.0000 mg | ORAL_TABLET | ORAL | Status: DC | PRN

## 2015-07-21 MED ORDER — PALONOSETRON HCL INJECTION 0.25 MG/5ML
0.2500 mg | Freq: Once | INTRAVENOUS | Status: AC
  Administered 2015-07-21: 0.25 mg via INTRAVENOUS

## 2015-07-21 MED ORDER — IRINOTECAN HCL CHEMO INJECTION 100 MG/5ML
180.0000 mg/m2 | Freq: Once | INTRAVENOUS | Status: AC
  Administered 2015-07-21: 350 mg via INTRAVENOUS
  Filled 2015-07-21: qty 5

## 2015-07-21 MED ORDER — SODIUM CHLORIDE 0.9 % IV SOLN
2400.0000 mg/m2 | INTRAVENOUS | Status: DC
  Administered 2015-07-21: 4650 mg via INTRAVENOUS
  Filled 2015-07-21: qty 93

## 2015-07-21 NOTE — Progress Notes (Signed)
No Avastin today pending upcoming surgery.  Tolerated chemo well. Continuous infusion pump intact. Ambulatory on discharge home to self.

## 2015-07-21 NOTE — Patient Instructions (Signed)
[facility] Discharge Instructions for Patients Receiving Chemotherapy   Beginning January 23rd 2017 lab work for the [facility] will be done in the  Main lab at [facility] on 1st floor. If you have a lab appointment with the [facility] please come in thru the  Main Entrance and check in at the main information desk   Today you received the following chemotherapy agents Irinotecan,leucovorin and 49f pump.  To help prevent nausea and vomiting after your treatment, we encourage you to take your nausea medication as instructed. If you develop nausea and vomiting, or diarrhea that is not controlled by your medication, call the clinic.  The clinic phone number is [phone] Office hours are Monday-Friday 8:30am-5:00pm.  BELOW ARE SYMPTOMS THAT SHOULD BE REPORTED IMMEDIATELY:  *FEVER GREATER THAN 101.0 F  *CHILLS WITH OR WITHOUT FEVER  NAUSEA AND VOMITING THAT IS NOT CONTROLLED WITH YOUR NAUSEA MEDICATION  *UNUSUAL SHORTNESS OF BREATH  *UNUSUAL BRUISING OR BLEEDING  TENDERNESS IN MOUTH AND THROAT WITH OR WITHOUT PRESENCE OF ULCERS  *URINARY PROBLEMS  *BOWEL PROBLEMS  UNUSUAL RASH Items with * indicate a potential emergency and should be followed up as soon as possible. If you have an emergency after office hours please contact your primary care physician or go to the nearest emergency department.  Please call the clinic during office hours if you have any questions or concerns.   You may also contact the Patient Navigator at [phone] you have any questions or need assistance in obtaining follow up care.  Resources For Cancer Patients and their Caregivers ? American Cancer Society: Can assist with transportation, wigs, general needs, runs Look Good Feel Better.        1831-607-0539? Cancer Care: Provides financial assistance, online support groups, medication/co-pay assistance.  1-800-813-HOPE ((670) 167-7689 ? [place] cancer patients and their families through emotional , educational and financial support.  3(807)269-6973? [place] DSS Where to apply for food stamps, Medicaid and utility assistance. 3410-253-7061? RCATS: Transportation to medical appointments. 34012245937? Social Security Administration: May apply for disability if have a Stage IV cancer. [phone]? [employer]: Assists with nutrition, care and transit needs. [phone]

## 2015-07-21 NOTE — Patient Instructions (Signed)
[place] at [facility] : [facility] Discharge Instructions  RECOMMENDATIONS MADE BY THE CONSULTANT AND ANY TEST RESULTS WILL BE SENT TO YOUR REFERRING PHYSICIAN.  Exam done today and seen by Kirby Crigler Refills given today on dilaudid. Will try another prednisone pack, will call into pharmacy. Return in 2weeks for follow up with Dr.Penland  Thank you for choosing [place] at [facility] to provide your oncology and hematology care.  To afford each patient quality time with our provider, please arrive at least 15 minutes before your scheduled appointment time.   Beginning January 23rd 2017 lab work for the [facility] will be done in the  Main lab at [facility] on 1st floor. If you have a lab appointment with the [facility] please come in thru the  Main Entrance and check in at the main information desk  You need to re-schedule your appointment should you arrive 10 or more minutes late.  We strive to give you quality time with our providers, and arriving late affects you and other patients whose appointments are after yours.  Also, if you no show three or more times for appointments you may be dismissed from the clinic at the providers discretion.     Again, thank you for choosing [facility].  Our hope is that these requests will decrease the amount of time that you wait before being seen by our physicians.       _____________________________________________________________  Should you have questions after your visit to [facility], please contact our office at [phone] between the hours of 8:30 a.m. and 4:30 p.m.  Voicemails left after 4:30 p.m. will not be returned until the following business day.  For prescription refill requests, have your pharmacy contact our office.         Resources For Cancer Patients and their Caregivers ? American Cancer Society: Can assist with transportation, wigs, general needs,  runs Look Good Feel Better.        703-822-9550 ? Cancer Care: Provides financial assistance, online support groups, medication/co-pay assistance.  1-800-813-HOPE 2540908681) ? [place] [facility]ists [place] cancer patients and their families through emotional , educational and financial support.  310-614-4844 ? [place] DSS Where to apply for food stamps, Medicaid and utility assistance. 941-266-2626 ? RCATS: Transportation to medical appointments. 956-539-0473 ? Social Security Administration: May apply for disability if have a Stage IV cancer. [phone] ? [employer], Disability and Transit Services: Assists with nutrition, care and transit needs. [phone]

## 2015-07-22 LAB — CEA: CEA: 3.4 ng/mL (ref 0.0–4.7)

## 2015-07-23 ENCOUNTER — Encounter (HOSPITAL_BASED_OUTPATIENT_CLINIC_OR_DEPARTMENT_OTHER): Payer: Medicaid Other

## 2015-07-23 VITALS — BP 151/75 | HR 72 | Temp 98.1°F | Resp 20

## 2015-07-23 DIAGNOSIS — C7800 Secondary malignant neoplasm of unspecified lung: Principal | ICD-10-CM

## 2015-07-23 DIAGNOSIS — C7801 Secondary malignant neoplasm of right lung: Secondary | ICD-10-CM | POA: Diagnosis not present

## 2015-07-23 DIAGNOSIS — Z452 Encounter for adjustment and management of vascular access device: Secondary | ICD-10-CM

## 2015-07-23 DIAGNOSIS — C787 Secondary malignant neoplasm of liver and intrahepatic bile duct: Secondary | ICD-10-CM | POA: Diagnosis not present

## 2015-07-23 DIAGNOSIS — C20 Malignant neoplasm of rectum: Secondary | ICD-10-CM

## 2015-07-23 DIAGNOSIS — C7802 Secondary malignant neoplasm of left lung: Secondary | ICD-10-CM | POA: Diagnosis not present

## 2015-07-23 MED ORDER — HEPARIN SOD (PORK) LOCK FLUSH 100 UNIT/ML IV SOLN
500.0000 [IU] | Freq: Once | INTRAVENOUS | Status: AC | PRN
  Administered 2015-07-23: 500 [IU]

## 2015-07-23 MED ORDER — SODIUM CHLORIDE 0.9% FLUSH
10.0000 mL | INTRAVENOUS | Status: DC | PRN
  Administered 2015-07-23: 10 mL
  Filled 2015-07-23: qty 10

## 2015-07-23 MED ORDER — HEPARIN SOD (PORK) LOCK FLUSH 100 UNIT/ML IV SOLN
INTRAVENOUS | Status: AC
  Filled 2015-07-23: qty 5

## 2015-07-23 NOTE — Patient Instructions (Signed)
[place] at [facility] Discharge Instructions  RECOMMENDATIONS MADE BY THE CONSULTANT AND ANY TEST RESULTS WILL BE SENT TO YOUR REFERRING PHYSICIAN.  Disconnected continuous infusion pump and flushed port per protocol.  Thank you for choosing [place] at [facility] to provide your oncology and hematology care.  To afford each patient quality time with our provider, please arrive at least 15 minutes before your scheduled appointment time.   Beginning January 23rd 2017 lab work for the [facility] will be done in the  Main lab at [facility] on 1st floor. If you have a lab appointment with the [facility] please come in thru the  Main Entrance and check in at the main information desk  You need to re-schedule your appointment should you arrive 10 or more minutes late.  We strive to give you quality time with our providers, and arriving late affects you and other patients whose appointments are after yours.  Also, if you no show three or more times for appointments you may be dismissed from the clinic at the providers discretion.     Again, thank you for choosing [facility].  Our hope is that these requests will decrease the amount of time that you wait before being seen by our physicians.       _____________________________________________________________  Should you have questions after your visit to [facility], please contact our office at [phone] between the hours of 8:30 a.m. and 4:30 p.m.  Voicemails left after 4:30 p.m. will not be returned until the following business day.  For prescription refill requests, have your pharmacy contact our office.         Resources For Cancer Patients and their Caregivers ? American Cancer Society: Can assist with transportation, wigs, general needs, runs Look Good Feel Better.        ([phone] ? Cancer Care: Provides financial assistance, online support  groups, medication/co-pay assistance.  1-800-813-HOPE 716-537-3522) ? [place] Assists [place] cancer patients and their families through emotional , educational and financial support.  724-232-3569 ? [place] DSS Where to apply for food stamps, Medicaid and utility assistance. [phone] ? RCATS: Transportation to medical appointments. 218-242-0575 ? Social Security Administration: May apply for disability if have a Stage IV cancer. 501-549-3682 [phone] ? [employer], Disability and Transit Services: Assists with nutrition, care and transit needs. [phone]

## 2015-07-23 NOTE — Progress Notes (Signed)
Continuous infusion pump complete. Disconnected infusion pump, flushed port per protocol and removed needle. Ambulatory on discharge home to self.

## 2015-08-04 ENCOUNTER — Inpatient Hospital Stay (HOSPITAL_COMMUNITY): Payer: Medicaid Other

## 2015-08-04 ENCOUNTER — Ambulatory Visit (HOSPITAL_COMMUNITY): Payer: Medicaid Other | Admitting: Hematology & Oncology

## 2015-08-04 NOTE — Progress Notes (Signed)
This encounter was created in error - please disregard.

## 2015-08-04 NOTE — Patient Instructions (Signed)
[place] at [facility] Discharge Instructions  RECOMMENDATIONS MADE BY THE CONSULTANT AND ANY TEST RESULTS WILL BE SENT TO YOUR REFERRING PHYSICIAN.   Exam and discussion by Dr [name] today  Return to see the doctor in Please call the clinic if you have any questions or concerns    Thank you for choosing [place] at [facility] to provide your oncology and hematology care.  To afford each patient quality time with our provider, please arrive at least 15 minutes before your scheduled appointment time.   Beginning January 23rd 2017 lab work for the [facility] will be done in the  Main lab at [facility] on 1st floor. If you have a lab appointment with the [facility] please come in thru the  Main Entrance and check in at the main information desk  You need to re-schedule your appointment should you arrive 10 or more minutes late.  We strive to give you quality time with our providers, and arriving late affects you and other patients whose appointments are after yours.  Also, if you no show three or more times for appointments you may be dismissed from the clinic at the providers discretion.     Again, thank you for choosing [facility].  Our hope is that these requests will decrease the amount of time that you wait before being seen by our physicians.       _____________________________________________________________  Should you have questions after your visit to [facility], please contact our office at [phone] between the hours of 8:30 a.m. and 4:30 p.m.  Voicemails left after 4:30 p.m. will not be returned until the following business day.  For prescription refill requests, have your pharmacy contact our office.         Resources For Cancer Patients and their Caregivers ? American Cancer Society: Can assist with transportation, wigs, general needs, runs Look Good Feel Better.         214-617-6882 ? Cancer Care: Provides financial assistance, online support groups, medication/co-pay assistance.  1-800-813-HOPE 714-508-7081) ? [employer] [facility]ists [place] cancer patients and their families through emotional , educational and financial support.  681-298-1962 ? [place] DSS Where to apply for food stamps, Medicaid and utility assistance. 548-408-7529 ? [employer]ATS: Transportation to medical appointments. 6124726297 ? Social Security Administration: May apply for disability if have a Stage IV cancer. [phone] 307-410-0011 ? [employer], Disability and Transit Services: Assists with nutrition, care and transit needs. [phone]

## 2015-08-06 ENCOUNTER — Encounter (HOSPITAL_COMMUNITY): Payer: Medicaid Other

## 2015-08-06 ENCOUNTER — Other Ambulatory Visit (HOSPITAL_COMMUNITY): Payer: Self-pay | Admitting: Oncology

## 2015-08-10 ENCOUNTER — Other Ambulatory Visit (HOSPITAL_COMMUNITY): Payer: Self-pay | Admitting: Oncology

## 2015-08-10 DIAGNOSIS — C7800 Secondary malignant neoplasm of unspecified lung: Secondary | ICD-10-CM

## 2015-08-10 DIAGNOSIS — K6289 Other specified diseases of anus and rectum: Secondary | ICD-10-CM

## 2015-08-10 DIAGNOSIS — C20 Malignant neoplasm of rectum: Secondary | ICD-10-CM

## 2015-08-10 MED ORDER — HYDROMORPHONE HCL 4 MG PO TABS: 4.0000 mg | ORAL_TABLET | ORAL | Status: DC | PRN

## 2015-08-14 ENCOUNTER — Ambulatory Visit (HOSPITAL_COMMUNITY): Payer: Medicaid Other | Admitting: Physical Therapy

## 2015-08-14 ENCOUNTER — Telehealth (HOSPITAL_COMMUNITY): Payer: Self-pay | Admitting: Physical Therapy

## 2015-08-14 NOTE — Telephone Encounter (Signed)
Patient will have surgery in two weeks to remove his rectum, he will call us back to reschedule after his surgery. NF

## 2015-08-17 NOTE — Progress Notes (Signed)
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  INTERVAL HISTORY: Andrew Kline 50 y.o. male returns for followup of Stage IV rectal cancer, KRAS wild-type.    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    09/25/2014 Imaging CT abd/pelvis- Indeterminate nodules within the bilateral lung bases with dominant approximately 1.7 cm centrally necrotic nodule within the right lower lobe...   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   10/03/2014 Tumor Marker CEA NOT ELEVATED   10/09/2014 Imaging CT Chest- Bilateral pulmonary nodules of varying size are most consistent with pulmonary metastasis. There are approximately 6 lesions in each lung.   10/17/2014 Pathology Results Rectum, biopsy - INVASIVE ADENOCARCINOMA   10/29/2014 Pathology Results Diagnosis Lung, needle/core biopsy(ies), right lower lobe - METASTATIC ADENOCARCINOMA   10/29/2014 Pathology Results KRAS wildtype   11/04/2014 - 12/18/2014 Radiation Therapy Dr. Lisbeth Renshaw   11/05/2014 - 05/26/2015 Chemotherapy FOLFOX + Avastin   02/11/2015 Surgery Colostomy formation by Dr. Arnoldo Morale.   02/26/2015 Imaging CT CAP- Interval response to therapy with decrease in size of multiple bilateral pulmonary nodules. No new or progressive pulmonary metastasis.  No evidence for metastatic disease within the abdomen or pelvis.    03/22/2015 Imaging CT RENAL STONE- There is an obstructing 4 mm stone  within the distal left ureter resulting in moderate left hydroureteronephrosis.  Re- demonstrated pulmonary nodules within the right lower lobe and lingula, compatible with known metastasis.   05/20/2015 Progression CT Scans demonstrate pulmonary progression of disease.   05/20/2015 Imaging CT CAP- Mild progression of bilateral pulmonary metastases, including a 1.9 cm cavitary nodule in the right lower lobe with increasing solid component.   06/23/2015 -  Chemotherapy FOLFIRI + Avastin    I personally reviewed and went over laboratory results with the patient.  The results are noted within this dictation.  Labs will be updated today of course.  CEA remains stable and WNL.    I have called Dr. Arnoldo Morale and he notes that Andrew Kline was just cleared from a pulmonology perspective by Dr. Luan Kline for surgery.  Dr. Arnoldo Morale reports that he will see the patient in his clinic this week and then plan on surgery beginning of next week.  As a result, we will hold treatment today in preparation for surgery.    The patient is pleased to learn of this.  Past Medical History  Diagnosis Date  . Rectal pain   . Arthritis   . Renal cell cancer ([place])     2012.   [name] Rectal cancer ([place])   . Shortness of breath dyspnea   . Anxiety   . Depression   . 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     is allergic to oxaliplatin.  Current  Outpatient Prescriptions on File Prior to Visit  Medication Sig Dispense Refill  . albuterol (PROVENTIL HFA;VENTOLIN HFA) 108 (90 Base) MCG/ACT inhaler Inhale 2 puffs into the lungs every 6 (six) hours as needed for wheezing or shortness of breath. 1 Inhaler 2  . amitriptyline (ELAVIL) 50 MG tablet Take 1 tablet (50 mg total) by mouth at bedtime. 30 tablet 2  .  Bevacizumab (AVASTIN IV) Inject into the vein every 14 (fourteen) days. To start November 19, 2014    . citalopram (CELEXA) 40 MG tablet Take 1 tablet (40 mg total) by mouth daily. 30 tablet 1  . dextrose 5 % SOLN 1,000 mL with fluorouracil 5 GM/100ML SOLN Inject into the vein every 14 (fourteen) days. To start November 05, 2014. To infuse over 46 hours via pump.    [name] docusate sodium (COLACE) 100 MG capsule Take 1 capsule (100 mg total) by mouth every 12 (twelve) hours. 60 capsule 0  . DULoxetine (CYMBALTA) 30 MG capsule Take one capsule daily for 5 days then 2 daily thereafter (Patient taking differently: Take 30 mg by mouth daily. ) 60 capsule 3  . IRINOTECAN HCL IV Inject into the vein. Reported on 06/09/2015    . leucovorin 50 MG injection Inject into the vein every 14 (fourteen) days. To start November 05, 2014    . lidocaine-hydrocortisone (ANAMANTLE) 3-1 % KIT Place 1 application rectally 2 (two) times daily. For two weeks, alternate with Rectiv. 28 each 1  . lidocaine-prilocaine (EMLA) cream Apply a quarter size amount to port site 1 hour prior to chemo. Do not rub in. Cover with plastic wrap. 30 g 3  . LYRICA 75 MG capsule TAKE 2 CAPSULES BY MOUTH TWICE DAILY. 120 capsule 3  . ondansetron (ZOFRAN ODT) 4 MG disintegrating tablet 43m ODT q4 hours prn nausea/vomit 20 tablet 0  . prochlorperazine (COMPAZINE) 10 MG tablet Take 1 tablet (10 mg total) by mouth every 6 (six) hours as needed for nausea or vomiting. 60 tablet 0  . nitroGLYCERIN (NITROGLYN) 2 % ointment Apply 0.5 inches topically daily. Reported on 08/18/2015    . ondansetron (ZOFRAN) 4 mg TABS tablet Take 4 tablets by mouth every 8 (eight) hours as needed. (Patient not taking: Reported on 08/18/2015) 4 tablet 0  . potassium chloride SA (K-DUR,KLOR-CON) 20 MEQ tablet Take 1 tablet (20 mEq total) by mouth 2 (two) times daily. (Patient not taking: Reported on 08/18/2015) 30 tablet 0  . predniSONE (DELTASONE) 5 MG tablet Take 4 tablets (20 mg total) by mouth  daily with breakfast. (Patient not taking: Reported on 08/18/2015) 20 tablet 0   Current Facility-Administered Medications on File Prior to Visit  Medication Dose Route Frequency Provider Last Rate Last Dose  . 0.9 %  sodium chloride infusion   Intravenous Continuous SPatrici Ranks MD        Past Surgical History  Procedure Laterality Date  . Hypospadias correction    . Laparoscopic partial nephrectomy  2012    right side  . Knee surgery Left     arthroscopy  . Flexible sigmoidoscopy N/A 09/30/2014    Procedure: FLEXIBLE SIGMOIDOSCOPY;  Surgeon: SDanie Binder MD;  Location: [facility];  Service: Endoscopy;  Laterality: N/A;  . Biopsy N/A 09/30/2014    Procedure: BIOPSY;  Surgeon: SDanie Binder MD;  Location: [facility];  Service: Endoscopy;  Laterality: N/A;  Anal Canal  . Flexible sigmoidoscopy N/A 10/17/2014    Procedure: FLEXIBLE SIGMOIDOSCOPY;  Surgeon: SDanie Binder MD;  Location: [facility] SU[facility];  Service: Endoscopy;  Laterality: N/A;  1325  . Lung biopsy Right   . Colostomy N/A 02/11/2015    Procedure: COLOSTOMY;  Surgeon: Aviva Signs, MD;  Location: [facility];  Service: General;  Laterality: N/A;    Denies any headaches, dizziness, double vision, fevers, chills, night sweats, nausea, vomiting, diarrhea, constipation, chest pain, heart palpitations, shortness of breath, blood in stool, black tarry stool, urinary pain, urinary burning, urinary frequency, hematuria.   PHYSICAL EXAMINATION  ECOG PERFORMANCE STATUS: 1 - Symptomatic but completely ambulatory  Filed Vitals:   08/18/15 0823  BP: 135/69  Pulse: 87  Temp: 97.9 F (36.6 C)  Resp: 20    GENERAL:alert, no distress, well nourished, well developed, cooperative, unaccompanied, and in a chemotherapy bed ready for treatment.   SKIN: skin color, texture, turgor are normal, no rashes or significant lesions HEAD: Normocephalic, No masses, lesions, tenderness or abnormalities EYES: normal, EOMI, Conjunctiva are pink and  non-injected EARS: External exam is normal OROPHARYNX:lips, buccal mucosa, and tongue normal and mucous membranes are moist , poor dentition NECK: supple, trachea midline LYMPH:  no palpable lymphadenopathy BREAST:not examined LUNGS: clear to auscultation without any wheezes, rales, or rhonchi HEART: regular rate & rhythm, without murmur, rub, or gallop. ABDOMEN:abdomen soft, normal bowel sounds and colostomy producing soft stool appropriately and flatus.  NO episodes of discomfort during examination. BACK: Back symmetric, no curvature., No CVA tenderness EXTREMITIES:less then 2 second capillary refill, no joint deformities, effusion, or inflammation, no skin discoloration, no cyanosis  NEURO: alert & oriented x 3 with fluent speech, no focal motor/sensory deficits.  LABORATORY DATA: CBC    Component Value Date/Time   WBC 8.8 08/18/2015 0835   RBC 3.92* 08/18/2015 0835   HGB 12.8* 08/18/2015 0835   HCT 38.8* 08/18/2015 0835   PLT 222 08/18/2015 0835   MCV 99.0 08/18/2015 0835   MCH 32.7 08/18/2015 0835   MCHC 33.0 08/18/2015 0835   RDW 17.1* 08/18/2015 0835   LYMPHSABS 0.7 08/18/2015 0835   MONOABS 1.2* 08/18/2015 0835   EOSABS 0.3 08/18/2015 0835   BASOSABS 0.0 08/18/2015 0835      Chemistry      Component Value Date/Time   NA 139 08/18/2015 0835   K 3.9 08/18/2015 0835   CL 102 08/18/2015 0835   CO2 26 08/18/2015 0835   BUN 10 08/18/2015 0835   CREATININE 0.72 08/18/2015 0835      Component Value Date/Time   CALCIUM 8.6* 08/18/2015 0835   ALKPHOS 121 08/18/2015 0835   AST 48* 08/18/2015 0835   ALT 49 08/18/2015 0835   BILITOT 0.4 08/18/2015 0835     Lab Results  Component Value Date   CEA 3.4 07/21/2015     PENDING LABS:   RADIOGRAPHIC STUDIES:  No results found.   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    Oncology history is updated.  His pain regimen consists of Dilaudid, Fentanyl and Lyrica.  Lyrica has been very helpful in his pain management.  He requests a refill on his Dilaudid, Fentanyl, and Lyrica and this is completed today.  He is currently on Fentanyl 150 mcg/hr, Dilaudid 4 mg PRN, and Lyrica 150 mg daily.   Dr. Arnoldo Morale plans  on operating on Mr. Fellman next week now that the patient is cleared from a pulmonology standpoint.  As a result, we will defer treatment today in order to not interfere with surgical intervention.  Approximately 2 weeks out from surgery, we will restart therapy as planned and hold Avastin ~ 4 weeks out from surgery.  Avastin has been on hold since 07/07/2015.  Treatment plan is manipulated accordingly.  Dimitrios is provided the following refills:  Fentanyl  Dilaudid  Ambien.  He will return in ~3 weeks for pre-treatment labs, follow-up appointment, and restart of chemotherapy.   THERAPY PLAN:  Will hold treatment today due to impending surgery.  Will restart post-operatively as described above.  All questions were answered. The patient knows to call the clinic with any problems, questions or concerns. We can certainly see the patient much sooner if necessary.  Patient and plan discussed with Dr. Ancil Linsey and she is in agreement with the aforementioned.   This note is electronically signed by: Doy Mince 08/18/2015 5:11 PM

## 2015-08-17 NOTE — Assessment & Plan Note (Addendum)
Stage IV rectal cancer, treated with systemic chemotherapy consisting of FOLFOX + Avastin from 11/05/2014- 05/26/2015 with CT imaging demonstrating progression of disease in pulmonary lesions, therefore a change in therapy to FOLFIRI + Avastin began on 06/23/2015.    Oncology history is updated.  His pain regimen consists of Dilaudid, Fentanyl and Lyrica.  Lyrica has been very helpful in his pain management.  He requests a refill on his Dilaudid, Fentanyl, and Lyrica and this is completed today.  He is currently on Fentanyl 150 mcg/hr, Dilaudid 4 mg PRN, and Lyrica 150 mg daily.   Dr. Arnoldo Morale plans on operating on Andrew Kline next week now that the patient is cleared from a pulmonology standpoint.  As a result, we will defer treatment today in order to not interfere with surgical intervention.  Approximately 2 weeks out from surgery, we will restart therapy as planned and hold Avastin ~ 4 weeks out from surgery.  Avastin has been on hold since 07/07/2015.  Treatment plan is manipulated accordingly.  Andrew Kline is provided the following refills:  Fentanyl  Dilaudid  Ambien.  He will return in ~3 weeks for pre-treatment labs, follow-up appointment, and restart of chemotherapy.

## 2015-08-18 ENCOUNTER — Encounter (HOSPITAL_COMMUNITY): Payer: Medicaid Other | Attending: Hematology & Oncology

## 2015-08-18 ENCOUNTER — Encounter: Payer: Self-pay | Admitting: *Deleted

## 2015-08-18 ENCOUNTER — Encounter (HOSPITAL_BASED_OUTPATIENT_CLINIC_OR_DEPARTMENT_OTHER): Payer: Medicaid Other | Admitting: Oncology

## 2015-08-18 ENCOUNTER — Encounter (HOSPITAL_COMMUNITY): Payer: Self-pay | Admitting: Oncology

## 2015-08-18 VITALS — BP 135/69 | HR 87 | Temp 97.9°F | Resp 20 | Wt 192.0 lb

## 2015-08-18 DIAGNOSIS — C7800 Secondary malignant neoplasm of unspecified lung: Secondary | ICD-10-CM

## 2015-08-18 DIAGNOSIS — K6289 Other specified diseases of anus and rectum: Secondary | ICD-10-CM | POA: Diagnosis not present

## 2015-08-18 DIAGNOSIS — R918 Other nonspecific abnormal finding of lung field: Secondary | ICD-10-CM | POA: Diagnosis not present

## 2015-08-18 DIAGNOSIS — Z95828 Presence of other vascular implants and grafts: Secondary | ICD-10-CM

## 2015-08-18 DIAGNOSIS — C20 Malignant neoplasm of rectum: Secondary | ICD-10-CM

## 2015-08-18 DIAGNOSIS — G4700 Insomnia, unspecified: Secondary | ICD-10-CM

## 2015-08-18 LAB — CBC WITH DIFFERENTIAL/PLATELET
Basophils Absolute: 0 10*3/uL (ref 0.0–0.1)
Basophils Relative: 0 %
EOS PCT: 3 %
Eosinophils Absolute: 0.3 10*3/uL (ref 0.0–0.7)
HCT: 38.8 % — ABNORMAL LOW (ref 39.0–52.0)
HEMOGLOBIN: 12.8 g/dL — AB (ref 13.0–17.0)
LYMPHS ABS: 0.7 10*3/uL (ref 0.7–4.0)
LYMPHS PCT: 8 %
MCH: 32.7 pg (ref 26.0–34.0)
MCHC: 33 g/dL (ref 30.0–36.0)
MCV: 99 fL (ref 78.0–100.0)
Monocytes Absolute: 1.2 10*3/uL — ABNORMAL HIGH (ref 0.1–1.0)
Monocytes Relative: 14 %
NEUTROS PCT: 75 %
Neutro Abs: 6.6 10*3/uL (ref 1.7–7.7)
Platelets: 222 10*3/uL (ref 150–400)
RBC: 3.92 MIL/uL — AB (ref 4.22–5.81)
RDW: 17.1 % — ABNORMAL HIGH (ref 11.5–15.5)
WBC: 8.8 10*3/uL (ref 4.0–10.5)

## 2015-08-18 LAB — COMPREHENSIVE METABOLIC PANEL
ALK PHOS: 121 U/L (ref 38–126)
ALT: 49 U/L (ref 17–63)
AST: 48 U/L — ABNORMAL HIGH (ref 15–41)
Albumin: 3.5 g/dL (ref 3.5–5.0)
Anion gap: 11 (ref 5–15)
BILIRUBIN TOTAL: 0.4 mg/dL (ref 0.3–1.2)
BUN: 10 mg/dL (ref 6–20)
CALCIUM: 8.6 mg/dL — AB (ref 8.9–10.3)
CO2: 26 mmol/L (ref 22–32)
CREATININE: 0.72 mg/dL (ref 0.61–1.24)
Chloride: 102 mmol/L (ref 101–111)
GFR calc non Af Amer: >60 mL/min (ref >60)
Glucose, Bld: 142 mg/dL — ABNORMAL HIGH (ref 65–99)
Potassium: 3.9 mmol/L (ref 3.5–5.1)
Sodium: 139 mmol/L (ref 135–145)
Total Protein: 6.7 g/dL (ref 6.5–8.1)

## 2015-08-18 MED ORDER — SODIUM CHLORIDE 0.9% FLUSH
10.0000 mL | Freq: Once | INTRAVENOUS | Status: AC
  Administered 2015-08-18: 10 mL via INTRAVENOUS

## 2015-08-18 MED ORDER — HEPARIN SOD (PORK) LOCK FLUSH 100 UNIT/ML IV SOLN
500.0000 [IU] | Freq: Once | INTRAVENOUS | Status: AC
  Administered 2015-08-18: 500 [IU] via INTRAVENOUS
  Filled 2015-08-18: qty 5

## 2015-08-18 MED ORDER — FENTANYL 75 MCG/HR TD PT72: 150.0000 ug | MEDICATED_PATCH | TRANSDERMAL | Status: DC

## 2015-08-18 MED ORDER — HYDROMORPHONE HCL 4 MG PO TABS: 4.0000 mg | ORAL_TABLET | ORAL | Status: DC | PRN

## 2015-08-18 MED ORDER — ZOLPIDEM TARTRATE 10 MG PO TABS: 10.0000 mg | ORAL_TABLET | Freq: Every day | ORAL | Status: DC

## 2015-08-18 NOTE — Progress Notes (Signed)
Otis Brace presented for Portacath access and flush. Proper placement of portacath confirmed by CXR. Portacath located right chest wall accessed with  H 20 needle. Good blood return present. Portacath flushed with 83m NS and 500U/530mHeparin and needle removed intact. Procedure without incident. Patient tolerated procedure well.

## 2015-08-18 NOTE — Patient Instructions (Signed)
[place] at [facility] Discharge Instructions  RECOMMENDATIONS MADE BY THE CONSULTANT AND ANY TEST RESULTS WILL BE SENT TO YOUR REFERRING PHYSICIAN.  Exam done and seen today by Kirby Crigler Treatment deferred today for three weeks till after surgery We refilled your ambien, dilaudid, fentanyl Return to see the Doctor in 3-4 weeks for chemo and follow up Call the clinic for any concerns or questions.  Thank you for choosing [place] at [facility] to provide your oncology and hematology care.  To afford each patient quality time with our provider, please arrive at least 15 minutes before your scheduled appointment time.   Beginning January 23rd 2017 lab work for the [facility] will be done in the  Main lab at [facility] on 1st floor. If you have a lab appointment with the [facility] please come in thru the  Main Entrance and check in at the main information desk  You need to re-schedule your appointment should you arrive 10 or more minutes late.  We strive to give you quality time with our providers, and arriving late affects you and other patients whose appointments are after yours.  Also, if you no show three or more times for appointments you may be dismissed from the clinic at the providers discretion.     Again, thank you for choosing [facility].  Our hope is that these requests will decrease the amount of time that you wait before being seen by our physicians.       _____________________________________________________________  Should you have questions after your visit to [facility], please contact our office at [phone] between the hours of 8:30 a.m. and 4:30 p.m.  Voicemails left after 4:30 p.m. will not be returned until the following business day.  For prescription refill requests, have your pharmacy contact our office.         Resources For Cancer Patients and their  Caregivers ? American Cancer Society: Can assist with transportation, wigs, general needs, runs Look Good Feel Better.        514-122-7278 ? Cancer Care: Provides financial assistance, online support groups, medication/co-pay assistance.  1-800-813-HOPE 4383779974) ? [place] Assists [place] cancer patients and their families through emotional , educational and financial support.  (847)197-7487 ? [place] DSS Where to apply for food stamps, Medicaid and utility assistance. 848 776 1718 ? [facility]: Transportation to medical appointments. (919) 421-2873 ? Social Security Administration: May apply for disability if have a Stage IV cancer. 906 081 1417 910-784-4095 ? [employer], Disability and Transit Services: Assists with nutrition, care and transit needs. (514)248-0050

## 2015-08-18 NOTE — Progress Notes (Signed)
[place] Clinical Social Work  Clinical Social Work was referred by [place] rounding for re-assessment of psychosocial needs. Clinical Education officer, museum met briefly with patient  [facility] to offer support and assess for needs. Pt eager for his surgery next week and seemed in good spirits today. Pt denied other needs, but would love for staff to come visit him next week while he is in the hospital. CSW to check in next week with pt.      Clinical Social Work interventions: Check in  [place], [place] Tuesdays   [phone]

## 2015-08-19 LAB — CEA: CEA: 3.2 ng/mL (ref 0.0–4.7)

## 2015-08-20 ENCOUNTER — Encounter (HOSPITAL_COMMUNITY): Payer: Medicaid Other

## 2015-08-21 NOTE — H&P (Signed)
  NTS SOAP Note  Vital Signs:  Vitals as of: 1/59/5396: Systolic 728: Diastolic 85: Heart Rate 93: Temp 98.47F: Height 59f 6in: Weight 199Lbs 0 Ounces: Pain Level 2: BMI 32.12  BMI : 32.12 kg/m2  Subjective: This 50year old male presents for of rectal cancer.  Has been undergoing chemotherapy as well as pelvic radiation therapy. He is status post colostomy formation in 2016. He does have lung metastases. Due to ongoing rectal pain, the patient now presents for a proctectomy. He did have a preoperative pulmonary clearance by Dr. EVelvet Bathewho stated his pulmonary function was within normal limits. Review of Symptoms:  Constitutional:unremarkable   Head:unremarkable Eyes:unremarkable   Nose/Mouth/Throat:unremarkable Cardiovascular:  unremarkable Respiratory:unremarkable Gastrointestinabdominal pain Genitourinary:unremarkable   Musculoskeletal:unremarkable Skin:unremarkable Hematolgic/Lymphatic:unremarkable   Allergic/Immunologic:unremarkable   Past Medical History:  Reviewed  Past Medical History  Surgical History: partial nephrectomy, knee surgery, colostomy formation Medical Problems: hypospadius, renal cancer, rectal cancer, depression, lung mets Allergies: nkda Medications: morphine, dilaudid, colace, avastin, leucovorin, oxaliplatin, elavil, Duragesic patch   Social History:Reviewed  Social History  Preferred Language: English Race:  White Ethnicity: Not Hispanic / Latino Age: [age]year Marital Status:  S Alcohol: socially   Smoking Status: Light tobacco smoker reviewed on 08/21/2015 Started Date:  Packs per week:  Functional Status reviewed on 08/21/2015 ------------------------------------------------ Bathing: Normal Cooking: Normal Dressing: Normal Driving: Normal Eating: Normal Managing Meds: Normal Oral Care: Normal Shopping: Normal Toileting: Normal Transferring: Normal Walking: Normal Cognitive Status reviewed on  08/21/2015 ------------------------------------------------ Attention: Normal Decision Making: Normal Language: Normal Memory: Normal Motor: Normal Perception: Normal Problem Solving: Normal Visual and Spatial: Normal   Family History:Reviewed  Family Health History Family History is Unknown    Objective Information: General:Well appearing, well nourished in no distress. Heart:RRR, no murmur or gallop.  Normal S1, S2.  No S3, S4.  Lungs:  CTA bilaterally, no significant wheezes, rhonchi, rales.  Breathing unlabored. 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 Inflamed anus, exam limited secondary to pain Records from Oncology reviewed Assessment:Rectal carcinoma with mets to lungs, currently undergoing neoadjuvant therapy  Diagnoses: 154.8  C20 Malignant tumor of rectum (Malignant neoplasm of rectum)  Procedures: 997915- OFFICE OUTPATIENT NEW 30 MINUTES    Plan:  Scheduled for proctectomy on 08/26/2015. The risks and benefits of the procedure including bleeding, infection, cardiopulmonary difficulties, and the possibility of a blood transfusion were fully explained to the patient, who gave informed consent.  Patient Education:Alternative treatments to surgery were discussed with patient (and family).  Risks and benefits  of procedure were fully explained to the patient (and family) who gave informed consent. Patient/family questions were addressed.  Follow-up:Pending Test Results

## 2015-08-21 NOTE — Patient Instructions (Signed)
      Andrew Kline  08/21/2015     '@PREFPERIOPPHARMACY'$ @   Your procedure is scheduled on  08/26/2015   Report to [facility] at  46  A.M.  Call this number if you have problems the morning of surgery:  [phone]   Remember:  Do not eat food or drink liquids after midnight.  Take these medicines the morning of surgery with A SIP OF WATER  Celexa, cymbalta, dilaudid, lyrica, zofran or compazine.   Do not wear jewelry, make-up or nail polish.  Do not wear lotions, powders, or perfumes.  You may wear deodorant.  Do not shave 48 hours prior to surgery.  Men may shave face and neck.  Do not bring valuables to the hospital.  [facility] is not responsible for any belongings or valuables.  Contacts, dentures or bridgework may not be worn into surgery.  Leave your suitcase in the car.  After surgery it may be brought to your room.  For patients admitted to the hospital, discharge time will be determined by your treatment team.  Patients discharged the day of surgery will not be allowed to drive home.   Name and phone number of your driver:   family Special instructions:  none  Please read over the following fact sheets that you were given. Coughing and Deep Breathing, MRSA Information, Surgical Site Infection Prevention and Anesthesia Post-op Instructions      PATIENT INSTRUCTIONS POST-ANESTHESIA  IMMEDIATELY FOLLOWING SURGERY:  Do not drive or operate machinery for the first twenty four hours after surgery.  Do not make any important decisions for twenty four hours after surgery or while taking narcotic pain medications or sedatives.  If you develop intractable nausea and vomiting or a severe headache please notify your doctor immediately.  FOLLOW-UP:  Please make an appointment with your surgeon as instructed. You do not need to follow up with anesthesia unless specifically instructed to do so.  WOUND CARE INSTRUCTIONS (if applicable):  Keep a dry clean dressing on the  anesthesia/puncture wound site if there is drainage.  Once the wound has quit draining you may leave it open to air.  Generally you should leave the bandage intact for twenty four hours unless there is drainage.  If the epidural site drains for more than 36-48 hours please call the anesthesia department.  QUESTIONS?:  Please feel free to call your physician or the hospital operator if you have any questions, and they will be happy to assist you.

## 2015-08-24 ENCOUNTER — Encounter (HOSPITAL_COMMUNITY)
Admission: RE | Admit: 2015-08-24 | Discharge: 2015-08-24 | Disposition: A | Discharge status: Home / Self Care | Payer: Medicaid Other | Source: Ambulatory Visit | Attending: General Surgery | Admitting: General Surgery

## 2015-08-24 ENCOUNTER — Encounter (HOSPITAL_COMMUNITY): Payer: Self-pay

## 2015-08-24 ENCOUNTER — Other Ambulatory Visit: Payer: Self-pay

## 2015-08-24 DIAGNOSIS — C20 Malignant neoplasm of rectum: Secondary | ICD-10-CM | POA: Diagnosis not present

## 2015-08-24 DIAGNOSIS — Z01812 Encounter for preprocedural laboratory examination: Secondary | ICD-10-CM | POA: Diagnosis not present

## 2015-08-24 DIAGNOSIS — Z0183 Encounter for blood typing: Secondary | ICD-10-CM | POA: Insufficient documentation

## 2015-08-24 DIAGNOSIS — Z01818 Encounter for other preprocedural examination: Secondary | ICD-10-CM | POA: Insufficient documentation

## 2015-08-24 LAB — CBC WITH DIFFERENTIAL/PLATELET
Basophils Absolute: 0 10*3/uL (ref 0.0–0.1)
Basophils Relative: 0 %
EOS ABS: 0.3 10*3/uL (ref 0.0–0.7)
Eosinophils Relative: 4 %
HEMATOCRIT: 38.7 % — AB (ref 39.0–52.0)
Hemoglobin: 12.6 g/dL — ABNORMAL LOW (ref 13.0–17.0)
LYMPHS ABS: 0.8 10*3/uL (ref 0.7–4.0)
Lymphocytes Relative: 11 %
MCH: 31.6 pg (ref 26.0–34.0)
MCHC: 32.6 g/dL (ref 30.0–36.0)
MCV: 97 fL (ref 78.0–100.0)
MONOS PCT: 11 %
Monocytes Absolute: 0.9 10*3/uL (ref 0.1–1.0)
NEUTROS PCT: 74 %
Neutro Abs: 5.5 10*3/uL (ref 1.7–7.7)
Platelets: 275 10*3/uL (ref 150–400)
RBC: 3.99 MIL/uL — ABNORMAL LOW (ref 4.22–5.81)
RDW: 16.8 % — ABNORMAL HIGH (ref 11.5–15.5)
WBC: 7.5 10*3/uL (ref 4.0–10.5)

## 2015-08-24 LAB — BASIC METABOLIC PANEL
Anion gap: 9 (ref 5–15)
BUN: 5 mg/dL — ABNORMAL LOW (ref 6–20)
CHLORIDE: 104 mmol/L (ref 101–111)
CO2: 26 mmol/L (ref 22–32)
CREATININE: 0.66 mg/dL (ref 0.61–1.24)
Calcium: 8.6 mg/dL — ABNORMAL LOW (ref 8.9–10.3)
GFR calc Af Amer: >60 mL/min (ref >60)
GFR calc non Af Amer: >60 mL/min (ref >60)
Glucose, Bld: 108 mg/dL — ABNORMAL HIGH (ref 65–99)
Potassium: 3.8 mmol/L (ref 3.5–5.1)
SODIUM: 139 mmol/L (ref 135–145)

## 2015-08-24 LAB — PREPARE RBC (CROSSMATCH)

## 2015-08-26 ENCOUNTER — Ambulatory Visit (HOSPITAL_COMMUNITY): Payer: Medicaid Other | Admitting: Anesthesiology

## 2015-08-26 ENCOUNTER — Encounter (HOSPITAL_COMMUNITY): Payer: Self-pay | Admitting: *Deleted

## 2015-08-26 ENCOUNTER — Inpatient Hospital Stay (HOSPITAL_COMMUNITY)
Admission: RE | Admit: 2015-08-26 | Discharge: 2015-08-31 | DRG: 330 | Disposition: A | Discharge status: Home Health | Payer: Medicaid Other | Source: Ambulatory Visit | Attending: General Surgery | Admitting: General Surgery

## 2015-08-26 ENCOUNTER — Encounter (HOSPITAL_COMMUNITY)
Admission: RE | Disposition: A | Discharge status: Home Health | Payer: Self-pay | Source: Ambulatory Visit | Attending: General Surgery

## 2015-08-26 DIAGNOSIS — F329 Major depressive disorder, single episode, unspecified: Secondary | ICD-10-CM | POA: Diagnosis present

## 2015-08-26 DIAGNOSIS — C787 Secondary malignant neoplasm of liver and intrahepatic bile duct: Secondary | ICD-10-CM | POA: Diagnosis present

## 2015-08-26 DIAGNOSIS — J449 Chronic obstructive pulmonary disease, unspecified: Secondary | ICD-10-CM | POA: Diagnosis present

## 2015-08-26 DIAGNOSIS — Z9221 Personal history of antineoplastic chemotherapy: Secondary | ICD-10-CM | POA: Diagnosis not present

## 2015-08-26 DIAGNOSIS — Q549 Hypospadias, unspecified: Secondary | ICD-10-CM | POA: Diagnosis not present

## 2015-08-26 DIAGNOSIS — F419 Anxiety disorder, unspecified: Secondary | ICD-10-CM | POA: Diagnosis present

## 2015-08-26 DIAGNOSIS — C7800 Secondary malignant neoplasm of unspecified lung: Secondary | ICD-10-CM | POA: Diagnosis present

## 2015-08-26 DIAGNOSIS — Z66 Do not resuscitate: Secondary | ICD-10-CM | POA: Diagnosis present

## 2015-08-26 DIAGNOSIS — C20 Malignant neoplasm of rectum: Secondary | ICD-10-CM | POA: Diagnosis present

## 2015-08-26 DIAGNOSIS — Z923 Personal history of irradiation: Secondary | ICD-10-CM | POA: Diagnosis not present

## 2015-08-26 DIAGNOSIS — K6289 Other specified diseases of anus and rectum: Secondary | ICD-10-CM

## 2015-08-26 HISTORY — PX: PERINEAL PROCTECTOMY: SHX6398

## 2015-08-26 LAB — PREPARE RBC (CROSSMATCH)

## 2015-08-26 LAB — HEMOGLOBIN AND HEMATOCRIT, BLOOD
HEMATOCRIT: 39.9 % (ref 39.0–52.0)
HEMOGLOBIN: 13.1 g/dL (ref 13.0–17.0)

## 2015-08-26 SURGERY — PROCTECTOMY, PERINEAL APPROACH
Anesthesia: General | Site: Anus

## 2015-08-26 MED ORDER — GELATIN ABSORBABLE 12-7 MM EX MISC
CUTANEOUS | Status: DC | PRN
  Administered 2015-08-26: 1

## 2015-08-26 MED ORDER — FENTANYL CITRATE (PF) 100 MCG/2ML IJ SOLN
INTRAMUSCULAR | Status: DC | PRN
  Administered 2015-08-26: 150 ug via INTRAVENOUS
  Administered 2015-08-26 (×4): 100 ug via INTRAVENOUS
  Administered 2015-08-26 (×2): 50 ug via INTRAVENOUS
  Administered 2015-08-26: 100 ug via INTRAVENOUS

## 2015-08-26 MED ORDER — 0.9 % SODIUM CHLORIDE (POUR BTL) OPTIME
TOPICAL | Status: DC | PRN
  Administered 2015-08-26: 2000 mL

## 2015-08-26 MED ORDER — ONDANSETRON HCL 4 MG/2ML IJ SOLN: 4.0000 mg | Freq: Four times a day (QID) | INTRAMUSCULAR | Status: DC | PRN

## 2015-08-26 MED ORDER — ENOXAPARIN SODIUM 40 MG/0.4ML ~~LOC~~ SOLN
40.0000 mg | SUBCUTANEOUS | Status: DC
Start: 2015-08-27 — End: 2015-08-31
  Administered 2015-08-27 – 2015-08-31 (×5): 40 mg via SUBCUTANEOUS
  Filled 2015-08-26 (×5): qty 0.4

## 2015-08-26 MED ORDER — HYDROMORPHONE HCL 1 MG/ML IJ SOLN
1.0000 mg | INTRAMUSCULAR | Status: DC | PRN
  Administered 2015-08-26: 1 mg via INTRAVENOUS
  Filled 2015-08-26: qty 1

## 2015-08-26 MED ORDER — LACTATED RINGERS IV SOLN
INTRAVENOUS | Status: DC
  Administered 2015-08-26 (×3): via INTRAVENOUS

## 2015-08-26 MED ORDER — SIMETHICONE 80 MG PO CHEW: 40.0000 mg | CHEWABLE_TABLET | Freq: Four times a day (QID) | ORAL | Status: DC | PRN

## 2015-08-26 MED ORDER — GLYCOPYRROLATE 0.2 MG/ML IJ SOLN
INTRAMUSCULAR | Status: DC | PRN
Start: 2015-08-26 — End: 2015-08-26
  Administered 2015-08-26: 0.6 mg via INTRAVENOUS

## 2015-08-26 MED ORDER — HYDROMORPHONE HCL 1 MG/ML IJ SOLN
0.2500 mg | INTRAMUSCULAR | Status: DC | PRN
Start: 2015-08-26 — End: 2015-08-31

## 2015-08-26 MED ORDER — ONDANSETRON HCL 4 MG/2ML IJ SOLN
INTRAMUSCULAR | Status: DC | PRN
  Administered 2015-08-26: 4 mg via INTRAVENOUS

## 2015-08-26 MED ORDER — ONDANSETRON HCL 4 MG/2ML IJ SOLN
INTRAMUSCULAR | Status: AC
Start: 2015-08-26 — End: 2015-08-26
  Filled 2015-08-26: qty 2

## 2015-08-26 MED ORDER — SUCCINYLCHOLINE CHLORIDE 20 MG/ML IJ SOLN
INTRAMUSCULAR | Status: DC | PRN
  Administered 2015-08-26: 180 mg via INTRAVENOUS

## 2015-08-26 MED ORDER — DEXAMETHASONE SODIUM PHOSPHATE 4 MG/ML IJ SOLN
INTRAMUSCULAR | Status: DC | PRN
  Administered 2015-08-26: 4 mg via INTRAVENOUS

## 2015-08-26 MED ORDER — LACTATED RINGERS IV SOLN: INTRAVENOUS | Status: DC

## 2015-08-26 MED ORDER — ZOLPIDEM TARTRATE 5 MG PO TABS
10.0000 mg | ORAL_TABLET | Freq: Every day | ORAL | Status: DC
  Administered 2015-08-26 – 2015-08-30 (×5): 10 mg via ORAL
  Filled 2015-08-26 (×5): qty 2

## 2015-08-26 MED ORDER — FENTANYL CITRATE (PF) 100 MCG/2ML IJ SOLN
INTRAMUSCULAR | Status: AC
  Filled 2015-08-26: qty 2

## 2015-08-26 MED ORDER — ACETAMINOPHEN 325 MG PO TABS: 650.0000 mg | ORAL_TABLET | Freq: Four times a day (QID) | ORAL | Status: DC | PRN

## 2015-08-26 MED ORDER — PROPOFOL 10 MG/ML IV BOLUS
INTRAVENOUS | Status: AC
  Filled 2015-08-26: qty 20

## 2015-08-26 MED ORDER — ONDANSETRON 4 MG PO TBDP
4.0000 mg | ORAL_TABLET | Freq: Four times a day (QID) | ORAL | Status: DC | PRN
  Filled 2015-08-26: qty 1

## 2015-08-26 MED ORDER — FENTANYL CITRATE (PF) 250 MCG/5ML IJ SOLN
INTRAMUSCULAR | Status: AC
  Filled 2015-08-26: qty 5

## 2015-08-26 MED ORDER — NEOSTIGMINE METHYLSULFATE 10 MG/10ML IV SOLN
INTRAVENOUS | Status: DC | PRN
  Administered 2015-08-26: 4 mg via INTRAVENOUS

## 2015-08-26 MED ORDER — CHLORHEXIDINE GLUCONATE 4 % EX LIQD: 1.0000 "application " | Freq: Once | CUTANEOUS | Status: DC

## 2015-08-26 MED ORDER — HYDROMORPHONE HCL 1 MG/ML IJ SOLN
2.0000 mg | INTRAMUSCULAR | Status: DC | PRN
  Administered 2015-08-27 – 2015-08-31 (×15): 2 mg via INTRAVENOUS
  Filled 2015-08-26 (×15): qty 2

## 2015-08-26 MED ORDER — FENTANYL CITRATE (PF) 100 MCG/2ML IJ SOLN: 25.0000 ug | INTRAMUSCULAR | Status: DC | PRN

## 2015-08-26 MED ORDER — NEOSTIGMINE METHYLSULFATE 10 MG/10ML IV SOLN
INTRAVENOUS | Status: AC
  Filled 2015-08-26: qty 1

## 2015-08-26 MED ORDER — POVIDONE-IODINE 10 % OINT PACKET
TOPICAL_OINTMENT | CUTANEOUS | Status: DC | PRN
  Administered 2015-08-26 (×2): 1 via TOPICAL

## 2015-08-26 MED ORDER — LORAZEPAM 2 MG/ML IJ SOLN
1.0000 mg | INTRAMUSCULAR | Status: DC | PRN
Start: 2015-08-26 — End: 2015-08-31
  Administered 2015-08-26 – 2015-08-31 (×3): 1 mg via INTRAVENOUS
  Filled 2015-08-26 (×3): qty 1

## 2015-08-26 MED ORDER — AMITRIPTYLINE HCL 25 MG PO TABS
50.0000 mg | ORAL_TABLET | Freq: Every day | ORAL | Status: DC
  Administered 2015-08-26 – 2015-08-30 (×5): 50 mg via ORAL
  Filled 2015-08-26 (×4): qty 2
  Filled 2015-08-26: qty 1
  Filled 2015-08-26: qty 2
  Filled 2015-08-26: qty 1
  Filled 2015-08-26 (×3): qty 2

## 2015-08-26 MED ORDER — POVIDONE-IODINE 10 % EX OINT
TOPICAL_OINTMENT | CUTANEOUS | Status: AC
  Filled 2015-08-26: qty 2

## 2015-08-26 MED ORDER — CEFOTETAN DISODIUM-DEXTROSE 2-2.08 GM-% IV SOLR
2.0000 g | Freq: Two times a day (BID) | INTRAVENOUS | Status: DC
  Administered 2015-08-26 – 2015-08-31 (×10): 2 g via INTRAVENOUS
  Filled 2015-08-26 (×13): qty 50

## 2015-08-26 MED ORDER — FENTANYL CITRATE (PF) 100 MCG/2ML IJ SOLN
25.0000 ug | INTRAMUSCULAR | Status: DC
  Administered 2015-08-26: 25 ug via INTRAVENOUS

## 2015-08-26 MED ORDER — PREGABALIN 50 MG PO CAPS: 75.0000 mg | ORAL_CAPSULE | Freq: Two times a day (BID) | ORAL | Status: DC

## 2015-08-26 MED ORDER — DIPHENHYDRAMINE HCL 50 MG/ML IJ SOLN
25.0000 mg | Freq: Four times a day (QID) | INTRAMUSCULAR | Status: DC | PRN
  Administered 2015-08-29: 25 mg via INTRAVENOUS
  Filled 2015-08-26: qty 1

## 2015-08-26 MED ORDER — DEXAMETHASONE SODIUM PHOSPHATE 4 MG/ML IJ SOLN
INTRAMUSCULAR | Status: AC
  Filled 2015-08-26: qty 1

## 2015-08-26 MED ORDER — ROCURONIUM BROMIDE 100 MG/10ML IV SOLN
INTRAVENOUS | Status: DC | PRN
  Administered 2015-08-26: 10 mg via INTRAVENOUS
  Administered 2015-08-26 (×2): 20 mg via INTRAVENOUS

## 2015-08-26 MED ORDER — HEMOSTATIC AGENTS (NO CHARGE) OPTIME
TOPICAL | Status: DC | PRN
  Administered 2015-08-26: 1 via TOPICAL

## 2015-08-26 MED ORDER — EPHEDRINE SULFATE 50 MG/ML IJ SOLN
INTRAMUSCULAR | Status: DC | PRN
Start: 2015-08-26 — End: 2015-08-26
  Administered 2015-08-26 (×5): 10 mg via INTRAVENOUS

## 2015-08-26 MED ORDER — ALBUTEROL SULFATE HFA 108 (90 BASE) MCG/ACT IN AERS
INHALATION_SPRAY | RESPIRATORY_TRACT | Status: DC | PRN
  Administered 2015-08-26 (×2): 2 via RESPIRATORY_TRACT

## 2015-08-26 MED ORDER — ACETAMINOPHEN 650 MG RE SUPP: 650.0000 mg | Freq: Four times a day (QID) | RECTAL | Status: DC | PRN

## 2015-08-26 MED ORDER — HYDROMORPHONE HCL 1 MG/ML IJ SOLN
1.0000 mg | INTRAMUSCULAR | Status: AC
  Administered 2015-08-26: 1 mg via INTRAVENOUS
  Filled 2015-08-26: qty 1

## 2015-08-26 MED ORDER — PROPOFOL 10 MG/ML IV BOLUS
INTRAVENOUS | Status: DC | PRN
  Administered 2015-08-26: 180 mg via INTRAVENOUS

## 2015-08-26 MED ORDER — MIDAZOLAM HCL 2 MG/2ML IJ SOLN
INTRAMUSCULAR | Status: AC
  Filled 2015-08-26: qty 2

## 2015-08-26 MED ORDER — DIPHENHYDRAMINE HCL 25 MG PO CAPS
25.0000 mg | ORAL_CAPSULE | Freq: Four times a day (QID) | ORAL | Status: DC | PRN
  Administered 2015-08-30: 25 mg via ORAL
  Filled 2015-08-26: qty 1

## 2015-08-26 MED ORDER — PANTOPRAZOLE SODIUM 40 MG IV SOLR
40.0000 mg | Freq: Every day | INTRAVENOUS | Status: DC
  Administered 2015-08-26 – 2015-08-27 (×2): 40 mg via INTRAVENOUS
  Filled 2015-08-26 (×2): qty 40

## 2015-08-26 MED ORDER — CEFOTETAN DISODIUM-DEXTROSE 2-2.08 GM-% IV SOLR
2.0000 g | INTRAVENOUS | Status: AC
  Administered 2015-08-26: 2 g via INTRAVENOUS

## 2015-08-26 MED ORDER — ROCURONIUM BROMIDE 50 MG/5ML IV SOLN
INTRAVENOUS | Status: AC
  Filled 2015-08-26: qty 1

## 2015-08-26 MED ORDER — CEFOTETAN DISODIUM-DEXTROSE 2-2.08 GM-% IV SOLR
INTRAVENOUS | Status: AC
  Filled 2015-08-26: qty 50

## 2015-08-26 MED ORDER — MIDAZOLAM HCL 2 MG/2ML IJ SOLN
1.0000 mg | INTRAMUSCULAR | Status: DC | PRN
  Administered 2015-08-26: 2 mg via INTRAVENOUS

## 2015-08-26 MED ORDER — ALBUTEROL SULFATE HFA 108 (90 BASE) MCG/ACT IN AERS
INHALATION_SPRAY | RESPIRATORY_TRACT | Status: AC
  Filled 2015-08-26: qty 6.7

## 2015-08-26 MED ORDER — MIDAZOLAM HCL 5 MG/5ML IJ SOLN
INTRAMUSCULAR | Status: DC | PRN
  Administered 2015-08-26: 2 mg via INTRAVENOUS

## 2015-08-26 MED ORDER — ENOXAPARIN SODIUM 40 MG/0.4ML ~~LOC~~ SOLN
40.0000 mg | Freq: Once | SUBCUTANEOUS | Status: AC
  Administered 2015-08-26: 40 mg via SUBCUTANEOUS

## 2015-08-26 MED ORDER — PREGABALIN 75 MG PO CAPS
75.0000 mg | ORAL_CAPSULE | Freq: Two times a day (BID) | ORAL | Status: DC
  Administered 2015-08-26 – 2015-08-31 (×10): 75 mg via ORAL
  Filled 2015-08-26 (×8): qty 1
  Filled 2015-08-26: qty 3
  Filled 2015-08-26: qty 1

## 2015-08-26 MED ORDER — ENOXAPARIN SODIUM 40 MG/0.4ML ~~LOC~~ SOLN
SUBCUTANEOUS | Status: AC
  Filled 2015-08-26: qty 0.4

## 2015-08-26 MED ORDER — CITALOPRAM HYDROBROMIDE 20 MG PO TABS
40.0000 mg | ORAL_TABLET | Freq: Every day | ORAL | Status: DC
  Administered 2015-08-27 – 2015-08-31 (×5): 40 mg via ORAL
  Filled 2015-08-26 (×5): qty 2

## 2015-08-26 MED ORDER — GLYCOPYRROLATE 0.2 MG/ML IJ SOLN
INTRAMUSCULAR | Status: AC
  Filled 2015-08-26: qty 3

## 2015-08-26 MED ORDER — FENTANYL 75 MCG/HR TD PT72
150.0000 ug | MEDICATED_PATCH | TRANSDERMAL | Status: DC
  Administered 2015-08-26 – 2015-08-29 (×2): 150 ug via TRANSDERMAL
  Filled 2015-08-26 (×2): qty 2

## 2015-08-26 MED ORDER — ONDANSETRON HCL 4 MG/2ML IJ SOLN: 4.0000 mg | Freq: Once | INTRAMUSCULAR | Status: DC | PRN

## 2015-08-26 MED ORDER — FENTANYL CITRATE (PF) 250 MCG/5ML IJ SOLN
INTRAMUSCULAR | Status: AC
Start: 2015-08-26 — End: 2015-08-26
  Filled 2015-08-26: qty 5

## 2015-08-26 MED ORDER — KETOROLAC TROMETHAMINE 30 MG/ML IJ SOLN: 30.0000 mg | Freq: Once | INTRAMUSCULAR | Status: DC

## 2015-08-26 MED ORDER — SUCCINYLCHOLINE CHLORIDE 20 MG/ML IJ SOLN
INTRAMUSCULAR | Status: AC
  Filled 2015-08-26: qty 1

## 2015-08-26 SURGICAL SUPPLY — 45 items
BAG HAMPER (MISCELLANEOUS) ×3 IMPLANT
CATH FOLEY 2WAY SLVR  5CC 12FR (CATHETERS) ×2
CATH FOLEY 2WAY SLVR 5CC 12FR (CATHETERS) ×1 IMPLANT
CLOTH BEACON ORANGE TIMEOUT ST (SAFETY) ×3 IMPLANT
COVER LIGHT HANDLE STERIS (MISCELLANEOUS) ×6 IMPLANT
DRAPE INCISE IOBAN 66X45 STRL (DRAPES) ×3 IMPLANT
DRAPE PROXIMA HALF (DRAPES) ×6 IMPLANT
DRAPE WARM FLUID 44X44 (DRAPE) ×3 IMPLANT
DRSG OPSITE POSTOP 4X10 (GAUZE/BANDAGES/DRESSINGS) ×3 IMPLANT
ELECT BLADE 6 FLAT ULTRCLN (ELECTRODE) ×6 IMPLANT
ELECT REM PT RETURN 9FT ADLT (ELECTROSURGICAL) ×3
ELECTRODE REM PT RTRN 9FT ADLT (ELECTROSURGICAL) ×1 IMPLANT
GAUZE SPONGE 4X4 12PLY STRL LF (GAUZE/BANDAGES/DRESSINGS) ×3 IMPLANT
GLOVE BIOGEL PI IND STRL 7.0 (GLOVE) ×3 IMPLANT
GLOVE BIOGEL PI INDICATOR 7.0 (GLOVE) ×6
GLOVE SURG SS PI 7.5 STRL IVOR (GLOVE) ×3 IMPLANT
GOWN STRL REUS W/TWL LRG LVL3 (GOWN DISPOSABLE) ×9 IMPLANT
HEMOSTAT SURGICEL 4X8 (HEMOSTASIS) ×3 IMPLANT
INST SET MAJOR GENERAL (KITS) ×3 IMPLANT
KIT ROOM TURNOVER APOR (KITS) ×3 IMPLANT
LIGASURE IMPACT 36 18CM CVD LR (INSTRUMENTS) ×3 IMPLANT
MANIFOLD NEPTUNE II (INSTRUMENTS) ×3 IMPLANT
NS IRRIG 1000ML POUR BTL (IV SOLUTION) ×6 IMPLANT
PACK PERI GYN (CUSTOM PROCEDURE TRAY) ×3 IMPLANT
PAD ABD 8X10 STRL (GAUZE/BANDAGES/DRESSINGS) ×3 IMPLANT
PAD ARMBOARD 7.5X6 YLW CONV (MISCELLANEOUS) ×6 IMPLANT
PENCIL HANDSWITCHING (ELECTRODE) ×6 IMPLANT
RETRACTOR WND ALEXIS 25 LRG (MISCELLANEOUS) ×1 IMPLANT
RTRCTR WOUND ALEXIS 25CM LRG (MISCELLANEOUS) ×3
SET BASIN LINEN APH (SET/KITS/TRAYS/PACK) ×3 IMPLANT
SPONGE DRAIN TRACH 4X4 STRL 2S (GAUZE/BANDAGES/DRESSINGS) ×3 IMPLANT
SPONGE LAP 18X18 X RAY DECT (DISPOSABLE) ×3 IMPLANT
SPONGE SURGIFOAM ABS GEL 100 (HEMOSTASIS) ×3 IMPLANT
STAPLER VISISTAT (STAPLE) ×6 IMPLANT
SUT CHROMIC 0 CTX 36 (SUTURE) ×3 IMPLANT
SUT CHROMIC 3 0 SH 27 (SUTURE) ×6 IMPLANT
SUT ETHILON 3 0 FSL (SUTURE) ×3 IMPLANT
SUT NOVA NAB GS-26 0 60 (SUTURE) ×6 IMPLANT
SUT PROLENE 2 0 FS (SUTURE) ×3 IMPLANT
SUT PROLENE 3 0 PS 2 (SUTURE) ×9 IMPLANT
SUT SILK 0 FSL (SUTURE) ×9 IMPLANT
SUT SILK 3 0 SH CR/8 (SUTURE) ×3 IMPLANT
SUT VIC AB 3-0 SH 27 (SUTURE) ×4
SUT VIC AB 3-0 SH 27X BRD (SUTURE) ×2 IMPLANT
TAPE CLOTH SURG 4X10 WHT LF (GAUZE/BANDAGES/DRESSINGS) ×3 IMPLANT

## 2015-08-26 NOTE — Op Note (Signed)
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  Anes:  Gen. endotracheal  Indications:  Patient is a 50 year old white male status post diverting colostomy in 2016 for rectal carcinoma who has known liver metastasis. He has been undergoing treatment for this. He has undergone radiation therapy for rectal cancer. He is having significant rectal pain, thus he is referred for an abdominal perineal resection for palliation of his rectal pain. The risks and benefits of the procedure including bleeding, infection, cardiopulmonary difficulties, and the possibility of needing a blood transfusion were fully explained to the patient, who gave informed consent.  Procedure note:  The patient was placed in the lithotomy position after induction of general endotracheal anesthesia. The abdomen and perineum were prepped and draped using the usual sterile technique with ChloraPrep and Betadine.  Surgical site confirmation was performed.  A midline incision was made at the level of the umbilicus through the previous midline surgical site. The peritoneal cavity was entered into without difficulty. The remaining distal sigmoid colon and rectum were easily identified. The colon and rectum were mobilized along its peritoneal reflection. Care was taken to avoid both ureters. The dissection was also taken down past the peritoneal reflection pelvis. Anteriorly. There was extensive hardness in this region from his previous radiation therapy to the cancer. The mesentery of the rectum was divided using the LigaSure. Once this was completed anteriorly, the dissection around the anus was performed. An incision was made elliptical around the anus. The levator muscles and attachments were divided using the Bovie. Once the posterior connection was made  into the pelvis through the perineum. The sigmoid colon was brought out through the perineal incision. While removing the rectum anteriorly, a small rent in the urethra was present. The patient was noted to have hypospadias requiring a 12 French catheter placement. The partial opening was approximately 3-5 mm in its greatest length. This was oversewn using both 3-0 chromic gut and 3-0 Vicryl sutures.  Dr. Alyson Ingles of Urology provided intraoperative consultation and stated he would see the patient in follow-up. The remaining rectum was removed and sent to pathology further examination. A bleeding was controlled using Bovie electrocautery and LigaSure. The wound was copiously irrigated normal saline. Changing gowns and gloves, the abdomen was then closed anteriorly. Surgicel and Gelfoam were placed along the sacrum. The pelvis was reperitonealized using a 2-0 chromic gut running suture. A #10 flat chest was placed into the pelvis and brought through separate stab wound to the right of midline incision. It was secured at the skin level using 3-0 nylon interrupted suture. The midline fascia was reapproximated using an 0 Novafil running suture. Subcutaneous layer was irrigated normal saline and skin was closed using staples. Betadine ointment and dry sterile dressing were applied.  The peritoneum was closed in layers using 0 Vicryl interrupted sutures. The skin was closed using a 3-0 Prolene interrupted suture. Betadine ointment and dry sterile dressing were applied.  All tape and needle counts were correct the end of the procedure. Patient was extubated in the operating room and transferred to PACU in guarded but stable condition.  Complications:  Injury to urethra  EBL:  500 mL  Specimen:  Rectum and anus  Blood: Transfused 2 units packed red blood cells  Drains: JP drain to pelvis

## 2015-08-26 NOTE — Interval H&P Note (Signed)
History and Physical Interval Note:  08/26/2015 10:46 AM  Andrew Kline  has presented today for surgery, with the diagnosis of rectal cancer  The various methods of treatment have been discussed with the patient and family. After consideration of risks, benefits and other options for treatment, the patient has consented to  Procedure(s) with comments: PERINEAL PROCTECTOMY (N/A) - Pt notified to arrive at 7:25am KF as a surgical intervention .  The patient's history has been reviewed, patient examined, no change in status, stable for surgery.  I have reviewed the patient's chart and labs.  Questions were answered to the patient's satisfaction.     Aviva Signs A

## 2015-08-26 NOTE — Transfer of Care (Signed)
Immediate Anesthesia Transfer of Care Note  Patient: Andrew Kline  Procedure(s) Performed: Procedure(s):  PROCTECTOMY (N/A)  Patient Location: PACU  Anesthesia Type:General  Level of Consciousness: awake, alert , oriented and patient cooperative  Airway & Oxygen Therapy: Patient Spontanous Breathing and Patient connected to face mask oxygen  Post-op Assessment: Report given to RN, Post -op Vital signs reviewed and stable and Patient moving all extremities X 4  Post vital signs: Reviewed and stable  Last Vitals:  Filed Vitals:   08/26/15 1115 08/26/15 1120  BP: 105/63 111/61  Temp:    Resp: 12 11    Last Pain: There were no vitals filed for this visit.    Patients Stated Pain Goal: 10 (33/00/76 2263)  Complications: No apparent anesthesia complications

## 2015-08-26 NOTE — Anesthesia Procedure Notes (Signed)
Procedure Name: Intubation Date/Time: 08/26/2015 11:57 AM Performed by: Gilmer Mor R Pre-anesthesia Checklist: Patient identified, Patient being monitored, Timeout performed, Emergency Drugs available and Suction available Patient Re-evaluated:Patient Re-evaluated prior to inductionOxygen Delivery Method: Circle System Utilized Preoxygenation: Pre-oxygenation with 100% oxygen Intubation Type: IV induction Ventilation: Mask ventilation without difficulty Laryngoscope Size: Mac and 3 Grade View: Grade II Tube type: Oral Tube size: 8.0 mm Number of attempts: 1 Airway Equipment and Method: stylet and Stylet Placement Confirmation: ETT inserted through vocal cords under direct vision,  positive ETCO2 and breath sounds checked- equal and bilateral Secured at: 21 cm Tube secured with: Tape Dental Injury: Teeth and Oropharynx as per pre-operative assessment

## 2015-08-26 NOTE — Anesthesia Postprocedure Evaluation (Signed)
Anesthesia Post Note  Patient: Beverly Suriano  Procedure(s) Performed: Procedure(s) (LRB):  PROCTECTOMY (N/A)  Patient location during evaluation: PACU Anesthesia Type: General Level of consciousness: awake and alert Pain management: pain level controlled Vital Signs Assessment: post-procedure vital signs reviewed and stable Respiratory status: spontaneous breathing, nonlabored ventilation, respiratory function stable and patient connected to face mask oxygen Cardiovascular status: blood pressure returned to baseline and stable Postop Assessment: no signs of nausea or vomiting Anesthetic complications: no    Last Vitals:  Filed Vitals:   08/26/15 1120 08/26/15 1545  BP: 111/61 136/85  Pulse:  96  Temp:  36.6 C  Resp: 11 20    Last Pain:  Filed Vitals:   08/26/15 1557  PainSc: Asleep                 Zayquan Bogard

## 2015-08-26 NOTE — Anesthesia Preprocedure Evaluation (Signed)
Anesthesia Evaluation  Patient identified by MRN, date of birth, ID band Patient awake    Reviewed: Allergy & Precautions, NPO status , Patient's Chart, lab work & pertinent test results  Airway Mallampati: II  TM Distance: >3 FB     Dental  (+) Edentulous Upper, Poor Dentition, Chipped, Missing, Dental Advisory Given   Pulmonary shortness of breath and with exertion, Current Smoker,  Lung nodules on CT, occasional hemoptysis   breath sounds clear to auscultation       Cardiovascular negative cardio ROS   Rhythm:Regular Rate:Normal     Neuro/Psych PSYCHIATRIC DISORDERS Anxiety Depression    GI/Hepatic Colon  Cancer with lung mets    Endo/Other    Renal/GU Renal disease (partial neph for renal cell CA)     Musculoskeletal   Abdominal   Peds  Hematology   Anesthesia Other Findings   Reproductive/Obstetrics                             Anesthesia Physical Anesthesia Plan  ASA: IV  Anesthesia Plan: General   Post-op Pain Management:    Induction: Intravenous  Airway Management Planned: Oral ETT  Additional Equipment:   Intra-op Plan:   Post-operative Plan: Extubation in OR  Informed Consent: I have reviewed the patients History and Physical, chart, labs and discussed the procedure including the risks, benefits and alternatives for the proposed anesthesia with the patient or authorized representative who has indicated his/her understanding and acceptance.     Plan Discussed with:   Anesthesia Plan Comments:         Anesthesia Quick Evaluation

## 2015-08-27 ENCOUNTER — Encounter (HOSPITAL_COMMUNITY): Payer: Self-pay | Admitting: General Surgery

## 2015-08-27 LAB — BASIC METABOLIC PANEL
ANION GAP: 8 (ref 5–15)
BUN: 9 mg/dL (ref 6–20)
CHLORIDE: 102 mmol/L (ref 101–111)
CO2: 25 mmol/L (ref 22–32)
Calcium: 8.1 mg/dL — ABNORMAL LOW (ref 8.9–10.3)
Creatinine, Ser: 0.63 mg/dL (ref 0.61–1.24)
GFR calc Af Amer: >60 mL/min (ref >60)
GLUCOSE: 139 mg/dL — AB (ref 65–99)
POTASSIUM: 4.4 mmol/L (ref 3.5–5.1)
Sodium: 135 mmol/L (ref 135–145)

## 2015-08-27 LAB — PHOSPHORUS: Phosphorus: 3.9 mg/dL (ref 2.5–4.6)

## 2015-08-27 LAB — CBC
HCT: 37.6 % — ABNORMAL LOW (ref 39.0–52.0)
HEMOGLOBIN: 12.4 g/dL — AB (ref 13.0–17.0)
MCH: 31.1 pg (ref 26.0–34.0)
MCHC: 33 g/dL (ref 30.0–36.0)
MCV: 94.2 fL (ref 78.0–100.0)
PLATELETS: 244 10*3/uL (ref 150–400)
RBC: 3.99 MIL/uL — AB (ref 4.22–5.81)
RDW: 18.4 % — ABNORMAL HIGH (ref 11.5–15.5)
WBC: 13.4 10*3/uL — AB (ref 4.0–10.5)

## 2015-08-27 LAB — MAGNESIUM: Magnesium: 1.8 mg/dL (ref 1.7–2.4)

## 2015-08-27 MED ORDER — ALBUTEROL SULFATE (2.5 MG/3ML) 0.083% IN NEBU: 2.5000 mg | INHALATION_SOLUTION | RESPIRATORY_TRACT | Status: DC | PRN

## 2015-08-27 MED ORDER — KCL IN DEXTROSE-NACL 20-5-0.45 MEQ/L-%-% IV SOLN
INTRAVENOUS | Status: DC
  Administered 2015-08-27 – 2015-08-30 (×4): via INTRAVENOUS

## 2015-08-27 NOTE — Anesthesia Postprocedure Evaluation (Signed)
Anesthesia Post Note  Patient: Andrew Kline  Procedure(s) Performed: Procedure(s) (LRB):  PROCTECTOMY (N/A)  Patient location during evaluation: ICU Anesthesia Type: General Level of consciousness: awake and alert, oriented and patient cooperative Pain management: pain level controlled Vital Signs Assessment: post-procedure vital signs reviewed and stable Respiratory status: spontaneous breathing, nonlabored ventilation, respiratory function stable and patient connected to nasal cannula oxygen Cardiovascular status: blood pressure returned to baseline Postop Assessment: no signs of nausea or vomiting Anesthetic complications: no    Last Vitals:  Filed Vitals:   08/27/15 0600 08/27/15 1120  BP: 116/58   Pulse: 77   Temp:  36.9 C  Resp: 9     Last Pain:  Filed Vitals:   08/27/15 1313  PainSc: 6                  Earleen Aoun J

## 2015-08-27 NOTE — Addendum Note (Signed)
Addendum  created 08/27/15 1353 by Charmaine Downs, CRNA   Modules edited: Clinical Notes   Clinical Notes:  File: [id number]

## 2015-08-27 NOTE — Care Management Note (Signed)
Case Management Note  Patient Details  Name: Andrew Kline MRN: [id number] Date of Birth: October 31, 1965  Subjective/Objective:                  Pt admitted s/p colon surgery. Pt is from home, lives with wife and is ind with ADL's. Pt had colostomy PTA and is comfortable caring for it. Pt has used [facility] in the past but is not active. Pt currently in pain and bowel function has no returned. Pt plans to return home with self care at DC. Pt would want to use [facility] for [facility] or DME if needed.   Action/Plan: No CM needs anticipated, will cont to follow.   Expected Discharge Date:     08/30/2015             Expected Discharge Plan:  Home/Self Care  In-House Referral:  NA  Discharge planning Services  CM Consult  Post Acute Care Choice:  NA Choice offered to:  NA  DME Arranged:    DME Agency:     HH Arranged:    HH Agency:     Status of Service:  In process, will continue to follow  Medicare Important Message Given:    Date Medicare IM Given:    Medicare IM give by:    Date Additional Medicare IM Given:    Additional Medicare Important Message give by:     If discussed at [place] of Stay Meetings, dates discussed:    Additional Comments:  Sherald Barge, RN 08/27/2015, 2:06 PM

## 2015-08-27 NOTE — Progress Notes (Signed)
1 Day Post-Op  Subjective: Patient does have incisional pain and his medications were adjusted yesterday evening.  Objective: Vital signs in last 24 hours: Temp:  [97.8 F (36.6 C)-98.3 F (36.8 C)] 98.3 F (36.8 C) (04/27 0000) Pulse Rate:  [72-100] 77 (04/27 0600) Resp:  [7-25] 9 (04/27 0600) BP: (103-139)/(47-85) 116/58 mmHg (04/27 0600) SpO2:  [88 %-100 %] 94 % (04/27 0600) FiO2 (%):  [28 %] 28 % (04/26 2000)    Intake/Output from previous day: 04/26 0701 - 04/27 0700 In: 5501.3 [I.V.:4806.3; Blood:670] Out: 2925 [Urine:2260; Drains:115; Stool:50; Blood:500] Intake/Output this shift:    General appearance: alert, cooperative and no distress Resp: Occasional expiratory wheezing bilaterally. Intermittent in nature. Cardio: regular rate and rhythm, S1, S2 normal, no murmur, click, rub or gallop GI: Soft, colostomy without output yet. Incision healing well. Perineal wound with dressing intact. JP drainage serosanguineous in nature.  Lab Results:   Recent Labs  08/24/15 1445 08/26/15 1550 08/27/15 0431  WBC 7.5  --  13.4*  HGB 12.6* 13.1 12.4*  HCT 38.7* 39.9 37.6*  PLT 275  --  244   BMET  Recent Labs  08/24/15 1445 08/27/15 0431  NA 139 135  K 3.8 4.4  CL 104 102  CO2 26 25  GLUCOSE 108* 139*  BUN 5* 9  CREATININE 0.66 0.63  CALCIUM 8.6* 8.1*   PT/INR No results for input(s): LABPROT, INR in the last 72 hours.  Studies/Results: No results found.  Anti-infectives: Anti-infectives    Start     Dose/Rate Route Frequency Ordered Stop   08/26/15 2200  cefoTEtan in Dextrose 5% (CEFOTAN) IVPB 2 g     2 g Intravenous Every 12 hours 08/26/15 1605     08/26/15 0815  cefoTEtan in Dextrose 5% (CEFOTAN) IVPB 2 g     2 g Intravenous On call to O.R. 08/26/15 0730 08/26/15 1134      Assessment/Plan: s/p Procedure(s):  PROCTECTOMY Impression: Stable on postoperative day 1. Pain control will be initiated due to patient's previous narcotic dependence. Patient  does request that his DO NOT RESUSCITATE be reinstated. Continue ICU monitoring for now.  LOS: 1 day    Andrew Kline A 08/27/2015

## 2015-08-28 LAB — CBC
HEMATOCRIT: 33.5 % — AB (ref 39.0–52.0)
HEMOGLOBIN: 11 g/dL — AB (ref 13.0–17.0)
MCH: 31.3 pg (ref 26.0–34.0)
MCHC: 32.8 g/dL (ref 30.0–36.0)
MCV: 95.2 fL (ref 78.0–100.0)
Platelets: 231 10*3/uL (ref 150–400)
RBC: 3.52 MIL/uL — AB (ref 4.22–5.81)
RDW: 18.3 % — ABNORMAL HIGH (ref 11.5–15.5)
WBC: 10.3 10*3/uL (ref 4.0–10.5)

## 2015-08-28 LAB — BASIC METABOLIC PANEL
ANION GAP: 7 (ref 5–15)
BUN: 7 mg/dL (ref 6–20)
CALCIUM: 8.1 mg/dL — AB (ref 8.9–10.3)
CO2: 27 mmol/L (ref 22–32)
Chloride: 100 mmol/L — ABNORMAL LOW (ref 101–111)
Creatinine, Ser: 0.59 mg/dL — ABNORMAL LOW (ref 0.61–1.24)
GFR calc Af Amer: >60 mL/min (ref >60)
GFR calc non Af Amer: >60 mL/min (ref >60)
GLUCOSE: 115 mg/dL — AB (ref 65–99)
POTASSIUM: 3.8 mmol/L (ref 3.5–5.1)
SODIUM: 134 mmol/L — AB (ref 135–145)

## 2015-08-28 LAB — MAGNESIUM: MAGNESIUM: 1.8 mg/dL (ref 1.7–2.4)

## 2015-08-28 LAB — PHOSPHORUS: Phosphorus: 2.8 mg/dL (ref 2.5–4.6)

## 2015-08-28 MED ORDER — PANTOPRAZOLE SODIUM 40 MG PO TBEC
40.0000 mg | DELAYED_RELEASE_TABLET | Freq: Every day | ORAL | Status: DC
  Administered 2015-08-28 – 2015-08-31 (×4): 40 mg via ORAL
  Filled 2015-08-28 (×4): qty 1

## 2015-08-28 NOTE — Progress Notes (Signed)
Patient sitting up in recliner chair tolerated well. Report given to RN on unit 300. Patient being transferred to room 316 via wheelchair.

## 2015-08-28 NOTE — Progress Notes (Signed)
2 Days Post-Op  Subjective: Patient's pain seems under better control. Is having some ostomy output. Is tolerating full liquid diet well. Has no complaints of shortness of breath.  Objective: Vital signs in last 24 hours: Temp:  [97.6 F (36.4 C)-98.6 F (37 C)] 98.6 F (37 C) (04/28 0800) Pulse Rate:  [65-100] 77 (04/28 1000) Resp:  [9-19] 19 (04/28 1000) BP: (95-129)/(54-77) 120/63 mmHg (04/28 0900) SpO2:  [93 %-100 %] 93 % (04/28 1000) FiO2 (%):  [32 %-94 %] 94 % (04/27 2139) Last BM Date: 08/26/15  Intake/Output from previous day: 04/27 0701 - 04/28 0700 In: 1572.5 [I.V.:1572.5] Out: 2170 [Urine:2000; Drains:100; Stool:70] Intake/Output this shift: Total I/O In: 240 [P.O.:240] Out: 900 [Urine:900]  General appearance: alert, cooperative and no distress Resp: clear to auscultation bilaterally Cardio: regular rate and rhythm, S1, S2 normal, no murmur, click, rub or gallop GI: Soft, incision healing well. Ostomy pink and patent. Bowel sounds appreciated. Perineal wound healing well.  Lab Results:   Recent Labs  08/27/15 0431 08/28/15 0419  WBC 13.4* 10.3  HGB 12.4* 11.0*  HCT 37.6* 33.5*  PLT 244 231   BMET  Recent Labs  08/27/15 0431 08/28/15 0419  NA 135 134*  K 4.4 3.8  CL 102 100*  CO2 25 27  GLUCOSE 139* 115*  BUN 9 7  CREATININE 0.63 0.59*  CALCIUM 8.1* 8.1*   PT/INR No results for input(s): LABPROT, INR in the last 72 hours.  Studies/Results: No results found.  Anti-infectives: Anti-infectives    Start     Dose/Rate Route Frequency Ordered Stop   08/26/15 2200  cefoTEtan in Dextrose 5% (CEFOTAN) IVPB 2 g     2 g Intravenous Every 12 hours 08/26/15 1605     08/26/15 0815  cefoTEtan in Dextrose 5% (CEFOTAN) IVPB 2 g     2 g Intravenous On call to O.R. 08/26/15 0730 08/26/15 1134      Assessment/Plan: s/p Procedure(s):  PROCTECTOMY Impression: Stable on postoperative day 2. Advancing diet as tolerated. Will get patient up to chair.  Adjust IV fluids. We'll transfer to regular floor.  LOS: 2 days    Orva Gwaltney A 08/28/2015

## 2015-08-29 NOTE — Progress Notes (Signed)
3 Days Post-Op  Subjective: Mild incisional pain. Is on full liquid diet. States his breathing is fair.  Objective: Vital signs in last 24 hours: Temp:  [98.2 F (36.8 C)-98.6 F (37 C)] 98.5 F (36.9 C) (04/29 0511) Pulse Rate:  [73-102] 102 (04/29 0511) Resp:  [14-19] 16 (04/29 0511) BP: (115-137)/(56-74) 115/56 mmHg (04/29 0511) SpO2:  [89 %-100 %] 89 % (04/29 0511) Last BM Date: 08/26/15  Intake/Output from previous day: 04/28 0701 - 04/29 0700 In: 1049.2 [P.O.:600; I.V.:349.2; IV Piggyback:100] Out: 4200 [Urine:4150; Drains:50] Intake/Output this shift:    General appearance: alert, cooperative and no distress Resp: clear to auscultation bilaterally Cardio: regular rate and rhythm, S1, S2 normal, no murmur, click, rub or gallop GI: Soft, incision healing well. Ostomy pink and patent. Peroneal wound healing well.  Lab Results:   Recent Labs  08/27/15 0431 08/28/15 0419  WBC 13.4* 10.3  HGB 12.4* 11.0*  HCT 37.6* 33.5*  PLT 244 231   BMET  Recent Labs  08/27/15 0431 08/28/15 0419  NA 135 134*  K 4.4 3.8  CL 102 100*  CO2 25 27  GLUCOSE 139* 115*  BUN 9 7  CREATININE 0.63 0.59*  CALCIUM 8.1* 8.1*   PT/INR No results for input(s): LABPROT, INR in the last 72 hours.  Studies/Results: No results found.  Anti-infectives: Anti-infectives    Start     Dose/Rate Route Frequency Ordered Stop   08/26/15 2200  cefoTEtan in Dextrose 5% (CEFOTAN) IVPB 2 g     2 g Intravenous Every 12 hours 08/26/15 1605     08/26/15 0815  cefoTEtan in Dextrose 5% (CEFOTAN) IVPB 2 g     2 g Intravenous On call to O.R. 08/26/15 0730 08/26/15 1134      Assessment/Plan: s/p Procedure(s):  PROCTECTOMY Impression: Stable on postoperative day 3. Bowel function returning. We'll advance to regular diet. Patient has not ambulated yet and she will try today. Pain control is still of concern as the patient was on narcotics preoperatively.  LOS: 3 days    Andrew Kline  A 08/29/2015

## 2015-08-29 NOTE — Progress Notes (Signed)
Attempted to ambulate pt, pt states he wants to walk in the a.m.

## 2015-08-30 LAB — BASIC METABOLIC PANEL
ANION GAP: 6 (ref 5–15)
BUN: 7 mg/dL (ref 6–20)
CALCIUM: 8.4 mg/dL — AB (ref 8.9–10.3)
CO2: 30 mmol/L (ref 22–32)
Chloride: 102 mmol/L (ref 101–111)
Creatinine, Ser: 0.75 mg/dL (ref 0.61–1.24)
Glucose, Bld: 112 mg/dL — ABNORMAL HIGH (ref 65–99)
POTASSIUM: 4 mmol/L (ref 3.5–5.1)
Sodium: 138 mmol/L (ref 135–145)

## 2015-08-30 LAB — TYPE AND SCREEN
ABO/RH(D): O POS
ANTIBODY SCREEN: NEGATIVE
Unit division: 0
Unit division: 0
Unit division: 0
Unit division: 0

## 2015-08-30 LAB — MAGNESIUM: MAGNESIUM: 2.1 mg/dL (ref 1.7–2.4)

## 2015-08-30 LAB — CBC
HCT: 34 % — ABNORMAL LOW (ref 39.0–52.0)
Hemoglobin: 11 g/dL — ABNORMAL LOW (ref 13.0–17.0)
MCH: 30.9 pg (ref 26.0–34.0)
MCHC: 32.4 g/dL (ref 30.0–36.0)
MCV: 95.5 fL (ref 78.0–100.0)
PLATELETS: 279 10*3/uL (ref 150–400)
RBC: 3.56 MIL/uL — AB (ref 4.22–5.81)
RDW: 17.5 % — AB (ref 11.5–15.5)
WBC: 7.5 10*3/uL (ref 4.0–10.5)

## 2015-08-30 LAB — PHOSPHORUS: PHOSPHORUS: 4.5 mg/dL (ref 2.5–4.6)

## 2015-08-30 MED ORDER — OXYCODONE-ACETAMINOPHEN 7.5-325 MG PO TABS: 1.0000 | ORAL_TABLET | ORAL | Status: DC | PRN

## 2015-08-30 NOTE — Plan of Care (Signed)
Problem: Fluid Volume: Goal: Ability to achieve a balanced intake and output will improve Outcome: Progressing Pt has had adequate output.  Problem: Nutritional: Goal: Ability to attain and maintain optimal nutritional status will improve Outcome: Progressing PT ate 100% of meal at 21:00 today.  Problem: Physical Regulation: Goal: Postoperative complications will be avoided or minimized Outcome: Progressing See doc flowsheet.  Problem: Respiratory: Goal: Respiratory status will improve Outcome: Progressing Pt can ambulate without SOB.  Problem: Pain Management: Goal: Pain level will decrease Outcome: Progressing Pain is controlled with medication.  Problem: Skin Integrity: Goal: Signs of wound healing will improve Outcome: Progressing Surgical area cleansed and ABD pads and dressing changed.  Problem: Urinary Elimination: Goal: Ability to achieve and maintain adequate urine output will improve Outcome: Progressing Pt has foley. Goal: Will remain free from infection Outcome: Progressing Pt receiving antibiotics.

## 2015-08-30 NOTE — Plan of Care (Signed)
Problem: Activity: Goal: Ability to tolerate increased activity will improve Outcome: Progressing Pt did get up tonight and walked to sink to bathe and change dressing. Pt states he will walk halls tomorrow.   Problem: Bowel/Gastric: Goal: Gastrointestinal status for postoperative course will improve Outcome: Progressing Pt denies nausea and vomiting. Last BM was 08/27/2015.  Problem: Respiratory: Goal: Respiratory status will improve Outcome: Progressing Pt using incentive spirometer  Problem: Urinary Elimination: Goal: Ability to achieve and maintain adequate urine output will improve Outcome: Progressing Pt has foley in place.

## 2015-08-30 NOTE — Progress Notes (Signed)
4 Days Post-Op  Subjective: Pain control much improved. Has started ambulating. Has no shortness of breath with oxygen off.  Objective: Vital signs in last 24 hours: Temp:  [98.2 F (36.8 C)-98.4 F (36.9 C)] 98.4 F (36.9 C) (04/30 0618) Pulse Rate:  [63-79] 63 (04/30 0618) Resp:  [16] 16 (04/30 0618) BP: (117-155)/(62-88) 117/62 mmHg (04/30 0618) SpO2:  [97 %-99 %] 99 % (04/30 0618) Last BM Date: 08/27/15  Intake/Output from previous day: 04/29 0701 - 04/30 0700 In: 720 [P.O.:720] Out: 2320 [Urine:2300; Drains:20] Intake/Output this shift:    General appearance: alert, cooperative and no distress Resp: clear to auscultation bilaterally Cardio: regular rate and rhythm, S1, S2 normal, no murmur, click, rub or gallop GI: Soft, incision healing well. Ostomy pink and patent.  Lab Results:   Recent Labs  08/28/15 0419 08/30/15 0549  WBC 10.3 7.5  HGB 11.0* 11.0*  HCT 33.5* 34.0*  PLT 231 279   BMET  Recent Labs  08/28/15 0419 08/30/15 0549  NA 134* 138  K 3.8 4.0  CL 100* 102  CO2 27 30  GLUCOSE 115* 112*  BUN 7 7  CREATININE 0.59* 0.75  CALCIUM 8.1* 8.4*   PT/INR No results for input(s): LABPROT, INR in the last 72 hours.  Studies/Results: No results found.  Anti-infectives: Anti-infectives    Start     Dose/Rate Route Frequency Ordered Stop   08/26/15 2200  cefoTEtan in Dextrose 5% (CEFOTAN) IVPB 2 g     2 g Intravenous Every 12 hours 08/26/15 1605     08/26/15 0815  cefoTEtan in Dextrose 5% (CEFOTAN) IVPB 2 g     2 g Intravenous On call to O.R. 08/26/15 0730 08/26/15 1134      Assessment/Plan: s/p Procedure(s):  PROCTECTOMY Impression: Stable on postoperative day 4. Is starting to ambulate well. Pain is under better control. Will start Percocet. Anticipate discharge in next 24-48 hours.  LOS: 4 days    Marisela Line A 08/30/2015

## 2015-08-31 MED ORDER — HYDROMORPHONE HCL 4 MG PO TABS: 4.0000 mg | ORAL_TABLET | ORAL | Status: DC | PRN

## 2015-08-31 NOTE — Progress Notes (Signed)
Discharge instructions given on medications,and follow up visits,patient verbalized understanding .Prescription sent with patient. Vital signs stable. Staff to accompany patient to awaiting vehicle.

## 2015-08-31 NOTE — Discharge Summary (Signed)
Physician Discharge Summary  Patient ID: Andrew Kline MRN: [id number] DOB/AGE: 08-18-1965 50 y.o.  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 It was becoming debilitating to him, thus he presented for a proctectomy for palliation. This was performed on 08/26/2015. During the surgery, there was a small tear of the urethra during the dissection and this was repaired. Intraoperative consultation with urology was performed. He did receive 2 units of packed red blood cells during the surgery. He otherwise tolerated procedure well. His postoperative course was remarkable only for mild hypomagnesemia. He was able to be weaned off the oxygen difficulty. His diet was advanced without difficulty. Final pathology is still pending. He is being discharged home in good and improving condition. He has to leave his Foley catheter in place for 2 weeks. He will follow neurology.  Consults: urology  Treatments: surgery: Proctectomy on 08/26/2015, palliation  Discharge Exam: Blood pressure 135/73, pulse 78, temperature 98.4 F (36.9 C), temperature source Oral, resp. rate 18, height _0  (1.651 m), weight 88.905 kg (196 lb), SpO2 93 %. General appearance: alert, cooperative and no distress Resp: clear to auscultation bilaterally Cardio: regular rate and rhythm, S1, S2 normal, no murmur, click, rub or gallop GI: Soft, incisions including abdominal and perineal healing well, ostomy pink and patent, JP drain with serous drainage present.  Disposition: 01-Home or Self Care     Medication  List    TAKE these medications        albuterol 108 (90 Base) MCG/ACT inhaler  Commonly known as:  PROVENTIL HFA;VENTOLIN HFA  Inhale 2 puffs into the lungs every 6 (six) hours as needed for wheezing or shortness of breath.     amitriptyline 50 MG tablet  Commonly known as:  ELAVIL  Take 1 tablet (50 mg total) by mouth at bedtime.     AVASTIN IV  Inject into the vein every 14 (fourteen) days. To start November 19, 2014     citalopram 40 MG tablet  Commonly known as:  CELEXA  Take 1 tablet (40 mg total) by mouth daily.     dextrose 5 % SOLN 1,000 mL with fluorouracil 5 GM/100ML SOLN  Inject into the vein every 14 (fourteen) days. To start November 05, 2014. To infuse over 46 hours via pump.     docusate sodium 100 MG capsule  Commonly known as:  COLACE  Take 1 capsule (100 mg total) by mouth every 12 (twelve) hours.     DULoxetine 30 MG capsule  Commonly known as:  CYMBALTA  Take one capsule daily for 5 days then 2 daily thereafter     fentaNYL 75 MCG/HR  Commonly known as:  DURAGESIC - dosed mcg/hr  Place 2 patches (150 mcg total) onto the skin every 3 (three) days.     HYDROmorphone 4 MG tablet  Commonly known as:  DILAUDID  Take 1 tablet (4 mg total) by mouth every 4 (four) hours as needed for severe pain.     IRINOTECAN HCL IV  Inject into the vein. Reported on 06/09/2015     leucovorin 50 MG injection  Inject into the vein every 14 (  fourteen) days. To start November 05, 2014     lidocaine-hydrocortisone 3-1 % Kit  Commonly known as:  ANAMANTLE  Place 1 application rectally 2 (two) times daily. For two weeks, alternate with Rectiv.     lidocaine-prilocaine cream  Commonly known as:  EMLA  Apply a quarter size amount to port site 1 hour prior to chemo. Do not rub in. Cover with plastic wrap.     LYRICA 75 MG capsule  Generic drug:  pregabalin  TAKE 2 CAPSULES BY MOUTH TWICE DAILY.     nitroGLYCERIN 2 % ointment  Commonly known as:  NITROGLYN  Apply 0.5 inches topically  daily. Reported on 08/18/2015     ondansetron 4 MG disintegrating tablet  Commonly known as:  ZOFRAN ODT  34m ODT q4 hours prn nausea/vomit     prochlorperazine 10 MG tablet  Commonly known as:  COMPAZINE  Take 1 tablet (10 mg total) by mouth every 6 (six) hours as needed for nausea or vomiting.     zolpidem 10 MG tablet  Commonly known as:  AMBIEN  Take 1 tablet (10 mg total) by mouth at bedtime.           Follow-up Information    Follow up with JJamesetta So MD. Schedule an appointment as soon as possible for a visit on 09/03/2015.   Specialty:  General Surgery   Contact information:   [phone]       Signed: JAviva SignsA 08/31/2015, 9:17 AM

## 2015-08-31 NOTE — Discharge Instructions (Signed)
Foley Catheter Care, Adult °A Foley catheter is a soft, flexible tube that is placed into the bladder to drain urine. A Foley catheter may be inserted if: °· You leak urine or are not able to control when you urinate (urinary incontinence). °· You are not able to urinate when you need to (urinary retention). °· You had prostate surgery or surgery on the genitals. °· You have certain medical conditions, such as multiple sclerosis, dementia, or a spinal cord injury. °If you are going home with a Foley catheter in place, follow the instructions below. °TAKING CARE OF THE CATHETER °1. Wash your hands with soap and water. °2. Using mild soap and warm water on a clean washcloth: °¨ Clean the area on your body closest to the catheter insertion site using a circular motion, moving away from the catheter. Never wipe toward the catheter because this could sweep bacteria up into the urethra and cause infection. °¨ Remove all traces of soap. Pat the area dry with a clean towel. For males, reposition the foreskin. °3. Attach the catheter to your leg so there is no tension on the catheter. Use adhesive tape or a leg strap. If you are using adhesive tape, remove any sticky residue left behind by the previous tape you used. °4. Keep the drainage bag below the level of the bladder, but keep it off the floor. °5. Check throughout the day to be sure the catheter is working and urine is draining freely. Make sure the tubing does not become kinked. °6. Do not pull on the catheter or try to remove it. Pulling could damage internal tissues. °TAKING CARE OF THE DRAINAGE BAGS °You will be given two drainage bags to take home. One is a large overnight drainage bag, and the other is a smaller leg bag that fits underneath clothing. You may wear the overnight bag at any time, but you should never wear the smaller leg bag at night. Follow the instructions below for how to empty, change, and clean your drainage bags. °Emptying the Drainage  Bag °You must empty your drainage bag when it is  -½ full or at least 2-3 times a day. °1. Wash your hands with soap and water. °2. Keep the drainage bag below your hips, below the level of your bladder. This stops urine from going back into the tubing and into your bladder. °3. Hold the dirty bag over the toilet or a clean container. °4. Open the pour spout at the bottom of the bag and empty the urine into the toilet or container. Do not let the pour spout touch the toilet, container, or any other surface. Doing so can place bacteria on the bag, which can cause an infection. °5. Clean the pour spout with a gauze pad or cotton ball that has rubbing alcohol on it. °6. Close the pour spout. °7. Attach the bag to your leg with adhesive tape or a leg strap. °8. Wash your hands well. °Changing the Drainage Bag °Change your drainage bag once a month or sooner if it starts to smell bad or look dirty. Below are steps to follow when changing the drainage bag. °1. Wash your hands with soap and water. °2. Pinch off the rubber catheter so that urine does not spill out. °3. Disconnect the catheter tube from the drainage tube at the connection valve. Do not let the tubes touch any surface. °4. Clean the end of the catheter tube with an alcohol wipe. Use a different alcohol wipe to clean   the end of the drainage tube. 5. Connect the catheter tube to the drainage tube of the clean drainage bag. 6. Attach the new bag to the leg with adhesive tape or a leg strap. Avoid attaching the new bag too tightly. 7. Wash your hands well. Cleaning the Drainage Bag 1. Wash your hands with soap and water. 2. Wash the bag in warm, soapy water. 3. Rinse the bag thoroughly with warm water. 4. Fill the bag with a solution of white vinegar and water (1 cup vinegar to 1 qt warm water [.2 L vinegar to 1 L warm water]). Close the bag and soak it for 30 minutes in the solution. 5. Rinse the bag with warm water. 6. Hang the bag to dry with the  pour spout open and hanging downward. 7. Store the clean bag (once it is dry) in a clean plastic bag. 8. Wash your hands well. PREVENTING INFECTION  Wash your hands before and after handling your catheter.  Take showers daily and wash the area where the catheter enters your body. Do not take baths. Replace wet leg straps with dry ones, if this applies.  Do not use powders, sprays, or lotions on the genital area. Only use creams, lotions, or ointments as directed by your caregiver.  For females, wipe from front to back after each bowel movement.  Drink enough fluids to keep your urine clear or pale yellow unless you have a fluid restriction.  Do not let the drainage bag or tubing touch or lie on the floor.  Wear cotton underwear to absorb moisture and to keep your skin drier. SEEK MEDICAL CARE IF:   Your urine is cloudy or smells unusually bad.  Your catheter becomes clogged.  You are not draining urine into the bag or your bladder feels full.  Your catheter starts to leak. SEEK IMMEDIATE MEDICAL CARE IF:   You have pain, swelling, redness, or pus where the catheter enters the body.  You have pain in the abdomen, legs, lower back, or bladder.  You have a fever.  You see blood fill the catheter, or your urine is pink or red.  You have nausea, vomiting, or chills.  Your catheter gets pulled out. MAKE SURE YOU:   Understand these instructions.  Will watch your condition.  Will get help right away if you are not doing well or get worse.   This information is not intended to replace advice given to you by your health care provider. Make sure you discuss any questions you have with your health care provider.   Document Released: 04/18/2005 Document Revised: 09/02/2013 Document Reviewed: 04/09/2012 Elsevier Interactive Patient Education 2016 [name] A bulb drain consists of a thin rubber tube and a soft, round bulb that creates a gentle  suction. The rubber tube is placed in the area where you had surgery. A bulb is attached to the end of the tube that is outside the body. The bulb drain removes excess fluid that normally builds up in a surgical wound after surgery. The color and amount of fluid will vary. Immediately after surgery, the fluid is bright red and is a little thicker than water. It may gradually change to a yellow or pink color and become more thin and water-like. When the amount decreases to about 1 or 2 tbsp in 24 hours, your health care provider will usually remove it. DAILY CARE  Keep the bulb flat (compressed) at all times, except while emptying it. The  flatness creates suction. You can flatten the bulb by squeezing it firmly in the middle and then closing the cap.  Keep sites where the tube enters the skin dry and covered with a bandage (dressing).  Secure the tube 1-2 in (2.5-5.1 cm) below the insertion sites to keep it from pulling on your stitches. The tube is stitched in place and will not slip out.  Secure the bulb as directed by your health care provider.  For the first 3 days after surgery, there usually is more fluid in the bulb. Empty the bulb whenever it becomes half full because the bulb does not create enough suction if it is too full. The bulb could also overflow. Write down how much fluid you remove each time you empty your drain. Add up the amount removed in 24 hours.  Empty the bulb at the same time every day once the amount of fluid decreases and you only need to empty it once a day. Write down the amounts and the 24-hour totals to give to your health care provider. This helps your health care provider know when the tubes can be removed. EMPTYING THE BULB DRAIN Before emptying the bulb, get a measuring cup, a piece of paper and a pen, and wash your hands. 7. Gently run your fingers down the tube (stripping) to empty any drainage from the tubing into the bulb. This may need to be done several times  a day to clear the tubing of clots and tissue. 8. Open the bulb cap to release suction, which causes it to inflate. Do not touch the inside of the cap. 9. Gently run your fingers down the tube (stripping) to empty any drainage from the tubing into the bulb. 10. Hold the cap out of the way, and pour fluid into the measuring cup.  11. Squeeze the bulb to provide suction. 12. Replace the cap.  13. Check the tape that holds the tube to your skin. If it is becoming loose, you can remove the loose piece of tape and apply a new one. Then, pin the bulb to your shirt.  14. Write down the amount of fluid you emptied out. Write down the date and each time you emptied your bulb drain. (If there are 2 bulbs, note the amount of drainage from each bulb and keep the totals separate. Your health care provider will want to know the total amounts for each drain and which tube is draining more.)  15. Flush the fluid down the toilet and wash your hands.  16. Call your health care provider once you have less than 2 tbsp of fluid collecting in the bulb drain every 24 hours. If there is drainage around the tube site, change dressings and keep the area dry. Cleanse around tube with sterile saline and place dry gauze around site. This gauze should be changed when it is soiled. If it stays clean and unsoiled, it should still be changed daily.  SEEK MEDICAL CARE IF: 9. Your drainage has a bad smell or is cloudy.  10. You have a fever.  11. Your drainage is increasing instead of decreasing.  12. Your tube fell out.  13. You have redness or swelling around the tube site.  14. You have drainage from a surgical wound.  15. Your bulb drain will not stay flat after you empty it.  MAKE SURE YOU:  8. Understand these instructions. 9. Will watch your condition. 10. Will get help right away if you are not doing well  or get worse.   This information is not intended to replace advice given to you by your health care  provider. Make sure you discuss any questions you have with your health care provider.   Document Released: 04/15/2000 Document Revised: 05/09/2014 Document Reviewed: 11/05/2014 Elsevier Interactive Patient Education [employer].

## 2015-08-31 NOTE — Care Management Note (Signed)
Case Management Note  Patient Details  Name: Andrew Kline MRN: [id number] Date of Birth: 1965-08-01  Expected Discharge Date:     08/31/2015             Expected Discharge Plan:  [place]  In-House Referral:  NA  Discharge planning Services  CM Consult  Post Acute Care Choice:  Home Health Choice offered to:  Patient  DME Arranged:    DME Agency:     HH Arranged:  RN [place] Agency:  [facility]  Status of Service:  Completed, signed off  Medicare Important Message Given:    Date Medicare IM Given:    Medicare IM give by:    Date Additional Medicare IM Given:    Additional Medicare Important Message give by:     If discussed at [place] of Stay Meetings, dates discussed:    Additional Comments: Pt discharging home today with [facility] nursing services through [facility] Pt is aware HH has 48 hours to make first visit. Romualdo Bolk, of [facility], made aware of referral and will obtain pt info from chart chart.   Sherald Barge, RN 08/31/2015, 10:58 AM

## 2015-09-01 ENCOUNTER — Emergency Department (HOSPITAL_COMMUNITY): Payer: Medicaid Other

## 2015-09-01 ENCOUNTER — Emergency Department (HOSPITAL_COMMUNITY)
Admission: EM | Admit: 2015-09-01 | Discharge: 2015-09-02 | Disposition: A | Discharge status: Home / Self Care | Payer: Medicaid Other | Attending: Emergency Medicine | Admitting: Emergency Medicine

## 2015-09-01 ENCOUNTER — Encounter (HOSPITAL_COMMUNITY): Payer: Self-pay | Admitting: Emergency Medicine

## 2015-09-01 DIAGNOSIS — K625 Hemorrhage of anus and rectum: Secondary | ICD-10-CM | POA: Diagnosis present

## 2015-09-01 DIAGNOSIS — Z79899 Other long term (current) drug therapy: Secondary | ICD-10-CM | POA: Insufficient documentation

## 2015-09-01 DIAGNOSIS — F329 Major depressive disorder, single episode, unspecified: Secondary | ICD-10-CM | POA: Insufficient documentation

## 2015-09-01 DIAGNOSIS — Z9049 Acquired absence of other specified parts of digestive tract: Secondary | ICD-10-CM | POA: Insufficient documentation

## 2015-09-01 DIAGNOSIS — Z85528 Personal history of other malignant neoplasm of kidney: Secondary | ICD-10-CM | POA: Diagnosis not present

## 2015-09-01 DIAGNOSIS — K91840 Postprocedural hemorrhage and hematoma of a digestive system organ or structure following a digestive system procedure: Secondary | ICD-10-CM | POA: Diagnosis not present

## 2015-09-01 DIAGNOSIS — Z85048 Personal history of other malignant neoplasm of rectum, rectosigmoid junction, and anus: Secondary | ICD-10-CM | POA: Insufficient documentation

## 2015-09-01 DIAGNOSIS — Z87891 Personal history of nicotine dependence: Secondary | ICD-10-CM | POA: Insufficient documentation

## 2015-09-01 DIAGNOSIS — Z5189 Encounter for other specified aftercare: Secondary | ICD-10-CM

## 2015-09-01 LAB — CBC WITH DIFFERENTIAL/PLATELET
BASOS PCT: 0 %
Basophils Absolute: 0 10*3/uL (ref 0.0–0.1)
Eosinophils Absolute: 0.3 10*3/uL (ref 0.0–0.7)
Eosinophils Relative: 2 %
HEMATOCRIT: 38.4 % — AB (ref 39.0–52.0)
HEMOGLOBIN: 13.1 g/dL (ref 13.0–17.0)
LYMPHS ABS: 0.8 10*3/uL (ref 0.7–4.0)
LYMPHS PCT: 5 %
MCH: 31.1 pg (ref 26.0–34.0)
MCHC: 34.1 g/dL (ref 30.0–36.0)
MCV: 91.2 fL (ref 78.0–100.0)
MONO ABS: 1.7 10*3/uL — AB (ref 0.1–1.0)
MONOS PCT: 11 %
NEUTROS ABS: 12.2 10*3/uL — AB (ref 1.7–7.7)
NEUTROS PCT: 82 %
Platelets: 359 10*3/uL (ref 150–400)
RBC: 4.21 MIL/uL — ABNORMAL LOW (ref 4.22–5.81)
RDW: 16.5 % — AB (ref 11.5–15.5)
WBC: 15 10*3/uL — ABNORMAL HIGH (ref 4.0–10.5)

## 2015-09-01 LAB — BASIC METABOLIC PANEL
ANION GAP: 11 (ref 5–15)
BUN: 11 mg/dL (ref 6–20)
CHLORIDE: 99 mmol/L — AB (ref 101–111)
CO2: 24 mmol/L (ref 22–32)
Calcium: 8.9 mg/dL (ref 8.9–10.3)
Creatinine, Ser: 0.73 mg/dL (ref 0.61–1.24)
GFR calc non Af Amer: >60 mL/min (ref >60)
GLUCOSE: 130 mg/dL — AB (ref 65–99)
POTASSIUM: 3.9 mmol/L (ref 3.5–5.1)
Sodium: 134 mmol/L — ABNORMAL LOW (ref 135–145)

## 2015-09-01 LAB — SAMPLE TO BLOOD BANK

## 2015-09-01 MED ORDER — DIATRIZOATE MEGLUMINE & SODIUM 66-10 % PO SOLN
ORAL | Status: AC
  Filled 2015-09-01: qty 30

## 2015-09-01 MED ORDER — ONDANSETRON HCL 4 MG/2ML IJ SOLN
4.0000 mg | Freq: Once | INTRAMUSCULAR | Status: AC
  Administered 2015-09-01: 4 mg via INTRAVENOUS
  Filled 2015-09-01: qty 2

## 2015-09-01 MED ORDER — IOPAMIDOL (ISOVUE-300) INJECTION 61%: 100.0000 mL | Freq: Once | INTRAVENOUS | Status: DC | PRN

## 2015-09-01 MED ORDER — SODIUM CHLORIDE 0.9 % IV BOLUS (SEPSIS)
1000.0000 mL | Freq: Once | INTRAVENOUS | Status: AC
  Administered 2015-09-01: 1000 mL via INTRAVENOUS

## 2015-09-01 MED ORDER — HYDROMORPHONE HCL 1 MG/ML IJ SOLN
1.0000 mg | Freq: Once | INTRAMUSCULAR | Status: AC
  Administered 2015-09-01: 1 mg via INTRAVENOUS
  Filled 2015-09-01: qty 1

## 2015-09-01 NOTE — ED Provider Notes (Signed)
CSN: [id number]     Arrival date & time 09/01/15  2121 History  By signing my name below, I, Nicole Kindred, attest that this documentation has been prepared under the direction and in the presence of Daleen Bo, MD.   Electronically Signed: Nicole Kindred, ED Scribe. 09/01/2015. 10:17 PM   Chief Complaint  Patient presents with  . Rectal Bleeding    The history is provided by the patient. No language interpreter was used.   HPI Comments: Andrew Kline is a 50 y.o. male with PSHx of perineal proctectomy who presents to the Emergency Department complaining of gradual onset, intermittent rectal bleeding, onset this morning when he woke up. He was seen on 08/27/2015 to complete the removal of his rectum and discharged on 08/30/2015. Pt reports associated rectal pain, intermittent diaphoresis, and non-productive cough. Wife states that she was never informed how to take care of him post-surgery. He thinks that he was "released too quickly from the hospital". No other associated symptoms noted. Coughing and talking make the bleeding worse. No other worsening or alleviating factors noted. Pt denies fever, chills, vomiting, or any other pertinent symptoms.    Past Medical History  Diagnosis Date  . Rectal pain   . Arthritis   . Shortness of breath dyspnea   . Anxiety   . Depression   . Kidney stone 03/22/15    left  . Renal cell cancer ([place])     2012.   [name] Rectal cancer [facility])    Past Surgical History  Procedure Laterality Date  . Hypospadias correction    . Laparoscopic partial nephrectomy  2012    right side  . Knee surgery Left     arthroscopy  . Flexible sigmoidoscopy N/A 09/30/2014    Procedure: FLEXIBLE SIGMOIDOSCOPY;  Surgeon: Danie Binder, MD;  Location: [facility] ORS;  Service: Endoscopy;  Laterality: N/A;  . Biopsy N/A 09/30/2014    Procedure: BIOPSY;  Surgeon: Danie Binder, MD;  Location: [facility] ORS;  Service: Endoscopy;  Laterality: N/A;  Anal Canal  . Flexible sigmoidoscopy N/A  10/17/2014    Procedure: FLEXIBLE SIGMOIDOSCOPY;  Surgeon: Danie Binder, MD;  Location: [facility] ENDO SUITE;  Service: Endoscopy;  Laterality: N/A;  1325  . Lung biopsy Right   . Colostomy N/A 02/11/2015    Procedure: COLOSTOMY;  Surgeon: Aviva Signs, MD;  Location: [facility] ORS;  Service: General;  Laterality: N/A;  . Perineal proctectomy N/A 08/26/2015    Procedure:  PROCTECTOMY;  Surgeon: Aviva Signs, MD;  Location: [facility] ORS;  Service: General;  Laterality: N/A;   Family History  Problem Relation Age of Onset  . Lung cancer Maternal Grandfather   . Colon cancer Maternal Uncle     age 83  . Diabetes Other   . Healthy Mother    Social History  Substance Use Topics  . Smoking status: Former Smoker -- 0.50 packs/day for 30 years    Types: Cigarettes  . Smokeless tobacco: None     Comment: a little over a pack daily  . Alcohol Use: 0.0 oz/week    0 Standard drinks or equivalent per week     Comment: Occ    Review of Systems  Constitutional: Positive for diaphoresis. Negative for fever and chills.  Gastrointestinal: Positive for anal bleeding and rectal pain. Negative for vomiting.  All other systems reviewed and are negative.   Allergies  Oxaliplatin  Home Medications   Prior to Admission medications   Medication Sig Start Date End Date Taking?  Authorizing Provider  albuterol (PROVENTIL HFA;VENTOLIN HFA) 108 (90 Base) MCG/ACT inhaler Inhale 2 puffs into the lungs every 6 (six) hours as needed for wheezing or shortness of breath. 05/26/15   Patrici Ranks, MD  amitriptyline (ELAVIL) 50 MG tablet Take 1 tablet (50 mg total) by mouth at bedtime. 06/23/15   Baird Cancer, PA-C  Bevacizumab (AVASTIN IV) Inject into the vein every 14 (fourteen) days. To start November 19, 2014    Historical Provider, MD  citalopram (CELEXA) 40 MG tablet Take 1 tablet (40 mg total) by mouth daily. 06/23/15   Baird Cancer, PA-C  dextrose 5 % SOLN 1,000 mL with fluorouracil 5 GM/100ML SOLN Inject into the  vein every 14 (fourteen) days. To start November 05, 2014. To infuse over 46 hours via pump.    Historical Provider, MD  docusate sodium (COLACE) 100 MG capsule Take 1 capsule (100 mg total) by mouth every 12 (twelve) hours. 09/25/14   Dalia Heading, PA-C  DULoxetine (CYMBALTA) 30 MG capsule Take one capsule daily for 5 days then 2 daily thereafter Patient taking differently: Take 30 mg by mouth daily.  02/19/15   Patrici Ranks, MD  fentaNYL (DURAGESIC - DOSED MCG/HR) 75 MCG/HR Place 2 patches (150 mcg total) onto the skin every 3 (three) days. 08/18/15   Baird Cancer, PA-C  HYDROmorphone (DILAUDID) 4 MG tablet Take 1 tablet (4 mg total) by mouth every 4 (four) hours as needed for severe pain. 08/31/15   Aviva Signs, MD  IRINOTECAN HCL IV Inject into the vein. Reported on 06/09/2015    Historical Provider, MD  leucovorin 50 MG injection Inject into the vein every 14 (fourteen) days. To start November 05, 2014    Historical Provider, MD  lidocaine-hydrocortisone (ANAMANTLE) 3-1 % KIT Place 1 application rectally 2 (two) times daily. For two weeks, alternate with Rectiv. 09/26/14   Mahala Menghini, PA-C  lidocaine-prilocaine (EMLA) cream Apply a quarter size amount to port site 1 hour prior to chemo. Do not rub in. Cover with plastic wrap. 11/02/14   Patrici Ranks, MD  LYRICA 75 MG capsule TAKE 2 CAPSULES BY MOUTH TWICE DAILY. 08/10/15   Baird Cancer, PA-C  nitroGLYCERIN (NITROGLYN) 2 % ointment Apply 0.5 inches topically daily. Reported on 08/18/2015    Historical Provider, MD  ondansetron (ZOFRAN ODT) 4 MG disintegrating tablet 77m ODT q4 hours prn nausea/vomit 03/22/15   JMerrily Pew MD  prochlorperazine (COMPAZINE) 10 MG tablet Take 1 tablet (10 mg total) by mouth every 6 (six) hours as needed for nausea or vomiting. 11/02/14   SPatrici Ranks MD  zolpidem (AMBIEN) 10 MG tablet Take 1 tablet (10 mg total) by mouth at bedtime. 08/18/15 09/17/15  TManon HildingKefalas, PA-C   BP 133/70 mmHg  Pulse 110   Temp(Src) 98.8 F (37.1 C)  Resp 22  Ht '5\' 5"'  (1.651 m)  Wt 194 lb (87.998 kg)  BMI 32.28 kg/m2  SpO2 96% Physical Exam  Constitutional: He is oriented to person, place, and time. He appears well-developed and well-nourished. He appears distressed (He is uncomfortable).  HENT:  Head: Normocephalic and atraumatic.  Right Ear: External ear normal.  Left Ear: External ear normal.  Eyes: Conjunctivae and EOM are normal. Pupils are equal, round, and reactive to light.  Neck: Normal range of motion and phonation normal. Neck supple.  Cardiovascular: Normal rate, regular rhythm and normal heart sounds.   Pulmonary/Chest: Effort normal and breath sounds normal. No respiratory distress.  He exhibits no bony tenderness.  Abdominal: Soft. There is no tenderness.  Midline lower abdominal surgical scar with staples intact, wound edges approximated mild erythema medially, but no chills, or drainage from the wound. Right lower quadrant. Abdomen with drain tubing, sutured to the skin. Ostomy left lower quadrant, is draining normal-appearing stool. Ostomy site itself appears normal.   Genitourinary:  Foley catheter in place, through the penis which has hypospadias. It is draining clear urine. Penis, scrotum and scrotal contents appear normal. Perineal area is difficult to assess, secondary to pain when the buttocks are distracted. I was able to see a portion of the site of venous removal, sutures are intact, but the wound edges are emitting a thin, slightly red colored drainage. The drainage amount is moderate, and worsens when the buttocks is elevated. There is moderate tenderness of the perineal area surgical wound site.  Musculoskeletal: Normal range of motion.  Neurological: He is alert and oriented to person, place, and time. No cranial nerve deficit or sensory deficit. He exhibits normal muscle tone. Coordination normal.  Skin: Skin is warm, dry and intact.  Psychiatric: He has a normal mood and affect.  His behavior is normal. Judgment and thought content normal.  Nursing note and vitals reviewed.   ED Course  Procedures (including critical care time)  Initial clinical impression- perineal wound drainage, likely indicating significant intra-abdominal/pelvic fluid collection. No clear evidence for intra-abdominal, or perineal infection. Patient to be evaluated. CT imaging to assess for fluid amount, and he indicated for abscess or other postoperative complication. SIRS negative.  Medications  diatrizoate meglumine-sodium (GASTROGRAFIN) 66-10 % solution (not administered)  sodium chloride 0.9 % bolus 1,000 mL (1,000 mLs Intravenous New Bag/Given 09/01/15 2253)  HYDROmorphone (DILAUDID) injection 1 mg (1 mg Intravenous Given 09/01/15 2250)  ondansetron (ZOFRAN) injection 4 mg (4 mg Intravenous Given 09/01/15 2250)    Patient Vitals for the past 24 hrs:  BP Temp Pulse Resp SpO2 Height Weight  09/01/15 2256 121/73 mmHg - 86 20 94 % - -  09/01/15 2132 133/70 mmHg 98.8 F (37.1 C) 110 22 96 % '5\' 5"'  (1.651 m) 194 lb (87.998 kg)    23:40- case discussed with general surgery, Dr. Arnoldo Morale, who was the patient's surgeon, when he is hospitalized and treated. After Arnoldo Morale feels that the patient has a fluid pocket, in the new region, which is draining noninfected fluid. He feels like the drain tube, exiting through the abdomen is in the pelvic region. Therefore, the patient is stable for discharge without intervention, and does not meet CT imaging, this time. Patient has a follow-up scheduled to see Dr. Arnoldo Morale, less than 36 hours, from now.  12:21 AM Reevaluation with update and discussion. After initial assessment and treatment, an updated evaluation reveals patient is fairly comfortable. The patient is comfortable going home at this time. Findings discussed with patient, all questions answered.Daleen Bo L    DIAGNOSTIC STUDIES: Oxygen Saturation is 96% on RA, normal by my interpretation.     COORDINATION OF CARE: 10:32 PM Discussed treatment plan which includes BMP and CBC with differential with pt at bedside and pt agreed to plan.  Labs Review Labs Reviewed - No data to display  Imaging Review No results found. I have personally reviewed and evaluated these images and lab results as part of my medical decision-making.   EKG Interpretation None      MDM   Final diagnoses:  Visit for wound check   Postoperative, wound drainage, without clinical or  laboratory evidence for surgical wound infection. Patient's wounds were cleansed and dressed. He feels more comfortable the time of discharge.   Nursing Notes Reviewed/ Care Coordinated Applicable Imaging Reviewed Interpretation of Laboratory Data incorporated into ED treatment  The patient appears reasonably screened and/or stabilized for discharge and I doubt any other medical condition or other [facility] requiring further screening, evaluation, or treatment in the ED at this time prior to discharge.  Plan: Home Medications- usual; Home Treatments- rest, wound care; return here if the recommended treatment, does not improve the symptoms; Recommended follow up- Surgery on 09/03/15 as scheduled   I personally performed the services described in this documentation, which was scribed in my presence. The recorded information has been reviewed and is accurate.       Daleen Bo, MD 09/02/15 [phone]

## 2015-09-01 NOTE — ED Notes (Signed)
Pt was discharged from hospital Monday. Pt states he started having rectal bleeding today. Wife states she was told nothing about how to take care of him.

## 2015-09-02 ENCOUNTER — Other Ambulatory Visit (HOSPITAL_COMMUNITY): Payer: Self-pay | Admitting: Pulmonary Disease

## 2015-09-02 DIAGNOSIS — R498 Other voice and resonance disorders: Secondary | ICD-10-CM

## 2015-09-02 DIAGNOSIS — J984 Other disorders of lung: Secondary | ICD-10-CM

## 2015-09-02 NOTE — Discharge Instructions (Signed)
Change the dressing, and her buttocks wound, as needed for control of the drainage.  You can use a heating pad on your abdomen to help the discomfort.  Watch for fever, and call your doctor if needed, for problems.

## 2015-09-02 NOTE — ED Notes (Signed)
Clean patient's surgical incision with sterile water and wound cleanser, applied ABD pads to rectum incision and applied disposable underwear.

## 2015-09-04 IMAGING — US IR FLUORO GUIDE CV LINE*R*
1 series · 1 of 1 positions shown · non-contrast
Comparison: none

CLINICAL DATA: Rectal carcinoma, needs venous access for
chemotherapy.
TECHNIQUE: The procedure, risks, benefits, and alternatives were explained to
the patient. Questions regarding the procedure were encouraged and
answered. The patient understands and consents to the procedure. As
antibiotic prophylaxis, cefazolin 1 g was ordered pre-procedure and
administered intravenously within one hour of incision. Patency of
the right IJ vein was confirmed with ultrasound with image
documentation. An appropriate skin site was determined. Skin site
was marked. Region was prepped using maximum barrier technique
including cap and mask, sterile gown, sterile gloves, large sterile
sheet, and Chlorhexidine as cutaneous antisepsis. The region was
infiltrated locally with 1% lidocaine. Under real-time ultrasound
guidance, the right IJ vein was accessed with a 21 gauge
micropuncture needle; the needle tip within the vein was confirmed
with ultrasound image documentation. Needle was exchanged over a 018
guidewire for transitional dilator which allowed passage of the
Benson wire into the IVC. Over this, the transitional dilator was
exchanged for a 5 French MPA catheter. A small incision was made on
the right anterior chest wall and a subcutaneous pocket fashioned.
The power-injectable port was positioned and its catheter tunneled
to the right IJ dermatotomy site. The MPA catheter was exchanged
over an Amplatz wire for a peel-away sheath, through which the port
catheter, which had been trimmed to the appropriate length, was
advanced and positioned under fluoroscopy with its tip at the
cavoatrial junction. Spot chest radiograph confirms good catheter
position and no pneumothorax. The pocket was closed with deep
interrupted and subcuticular continuous 3-0 Monocryl sutures. The
port was flushed per protocol. The incisions were covered with
Dermabond then covered with a sterile dressing.

COMPLICATIONS:
COMPLICATIONS
None immediate

[Series 1: ir fluoro/shunt/fist · 1 of 1 slices shown]
[im 1/1]
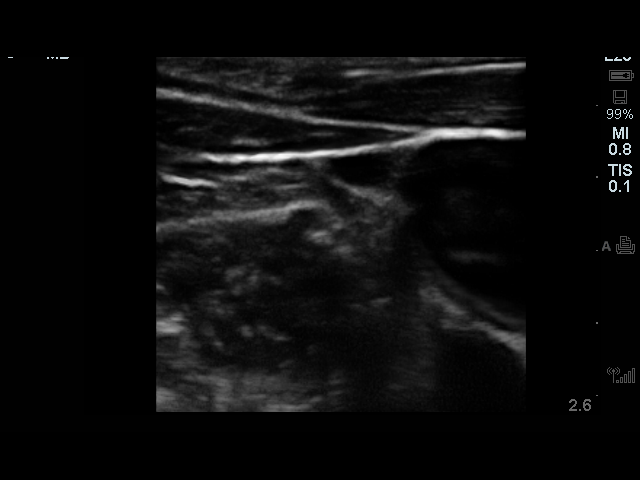

[1 of 1 positions shown; findings below may reference images not displayed]

EXAM:
TUNNELED PORT CATHETER PLACEMENT WITH ULTRASOUND AND FLUOROSCOPIC
GUIDANCE

FLUOROSCOPY TIME:  12 seconds, 6.5 mGy

ANESTHESIA/SEDATION:
Intravenous Fentanyl and Versed were administered as conscious
sedation during continuous cardiorespiratory monitoring by the
radiology RN, with a total moderate sedation time of less than 30
minutes.
IMPRESSION: Technically successful right IJ power-injectable port catheter
placement. Ready for routine use.

## 2015-09-04 IMAGING — CT CT BIOPSY
1 of 2 series · 14 of 32 positions shown, 19 images · non-contrast
Comparison: none

CLINICAL DATA: History of renal carcinoma post nephrectomy. Recent
diagnosis of rectal carcinoma. Multiple pulmonary nodules.

[Series 2: i-spiral 5.0 b40f · axial · 0.92mm/px · z∈[-380,-177]mm · 14 of 68 slices shown, 19 images]
[im 5/68  soft-tissue]
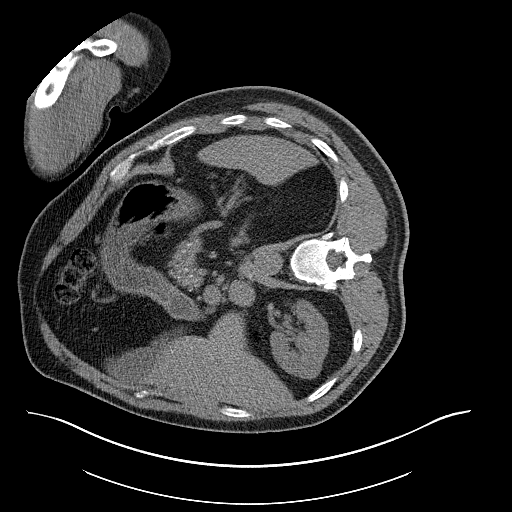
[im 5/68  bone]
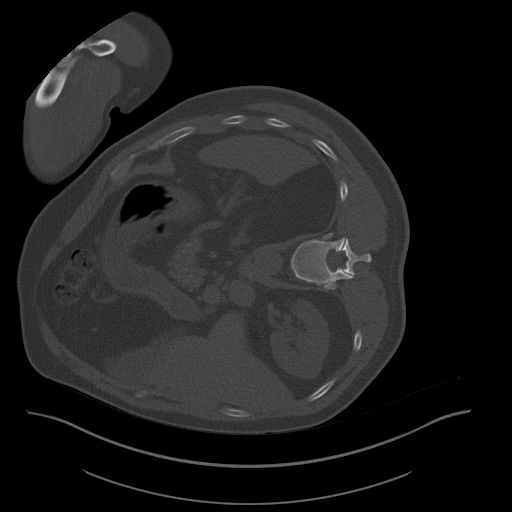
[im 9/68  soft-tissue]
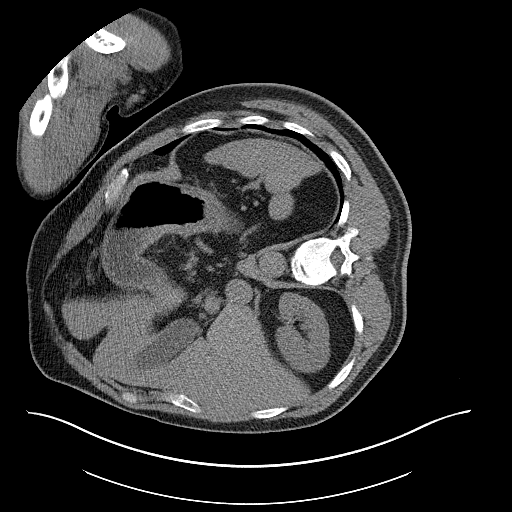
[im 13/68  soft-tissue]
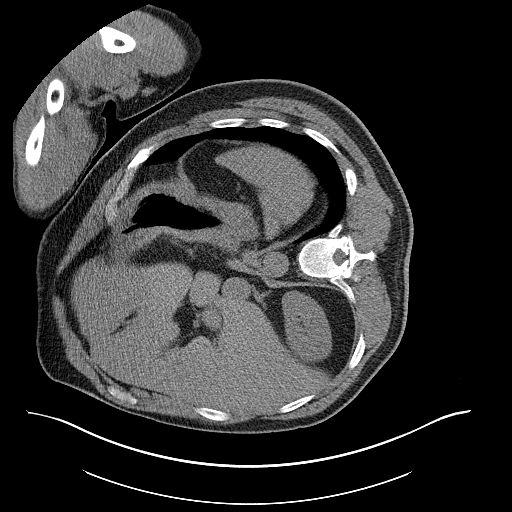
[im 21/68  soft-tissue]
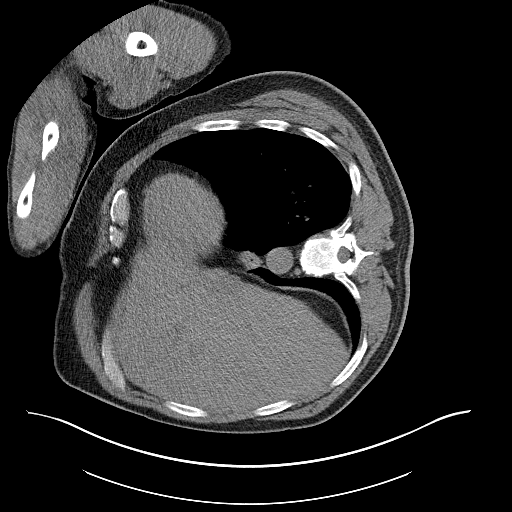
[im 26/68  soft-tissue]
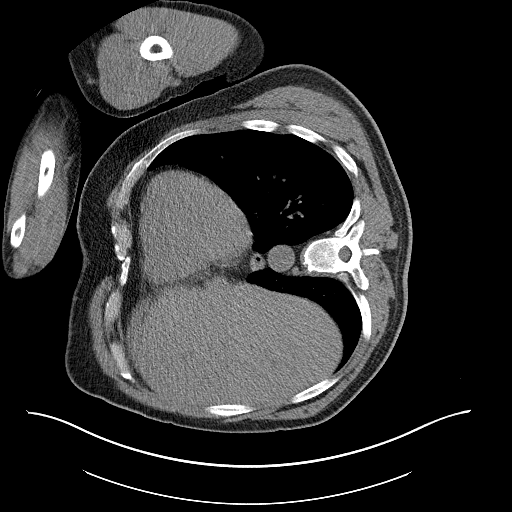
[im 30/68  soft-tissue]
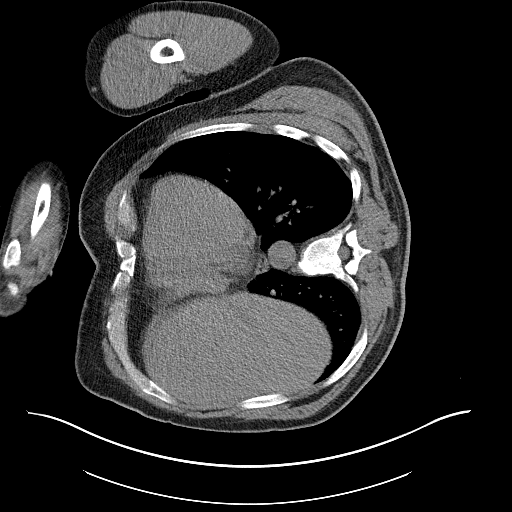
[im 34/68  soft-tissue]
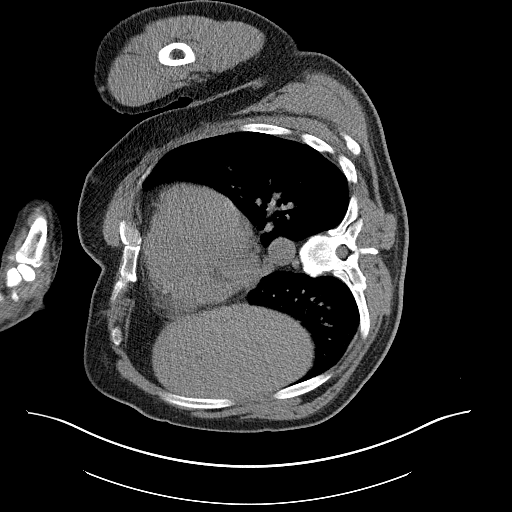
[im 38/68  soft-tissue]
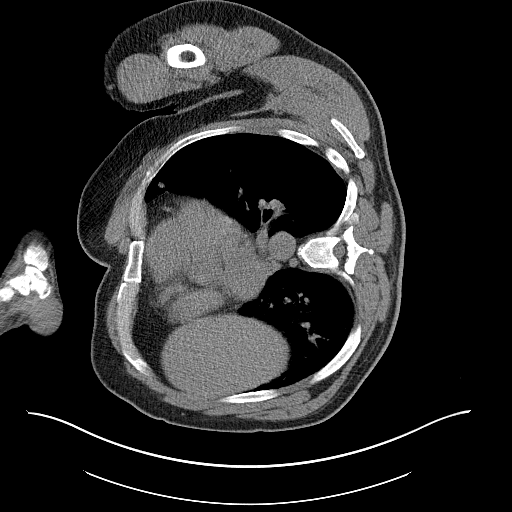
[im 42/68  soft-tissue]
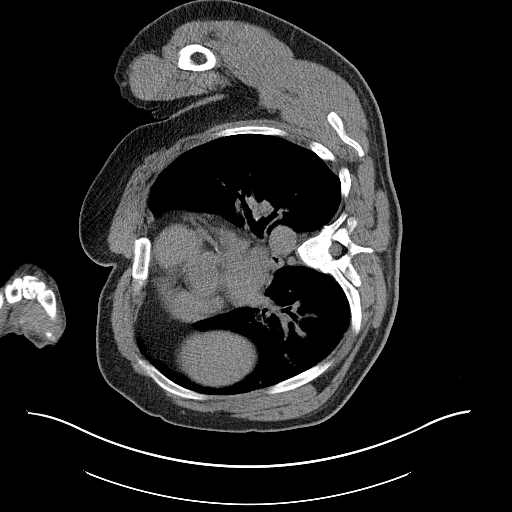
[im 42/68  bone]
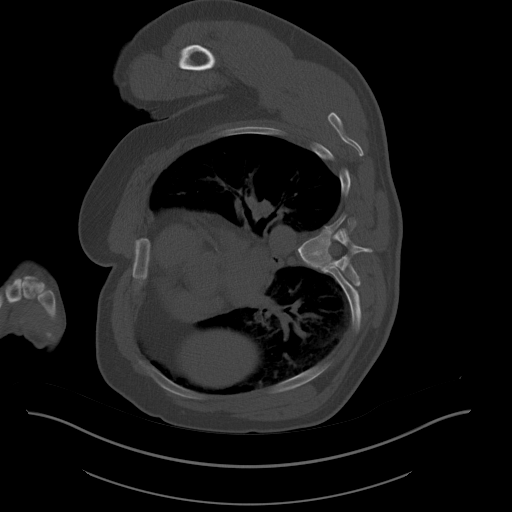
[im 47/68  soft-tissue]
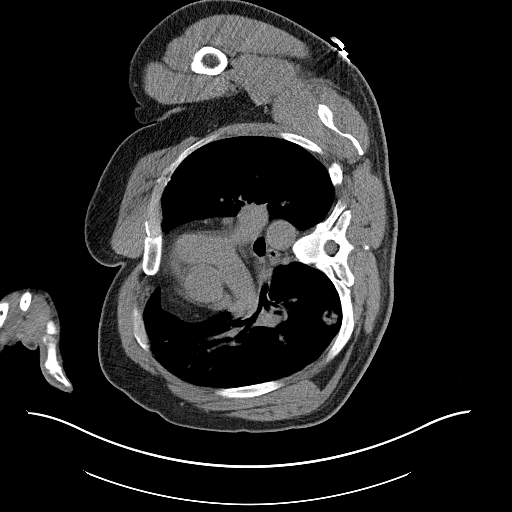
[im 51/68  lung]
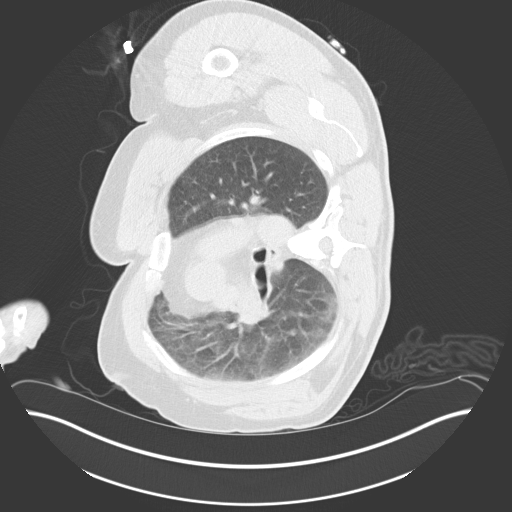
[im 55/68  soft-tissue]
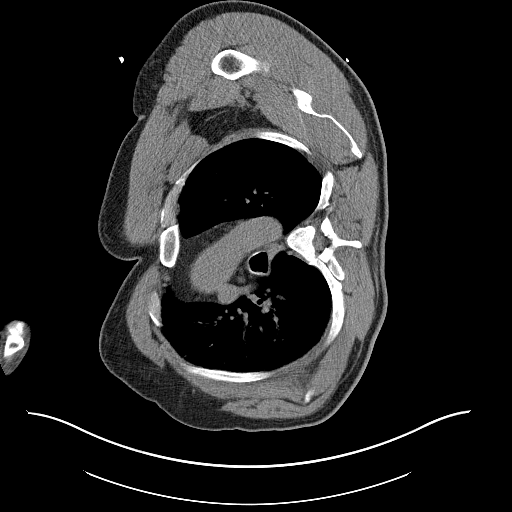
[im 55/68  lung]
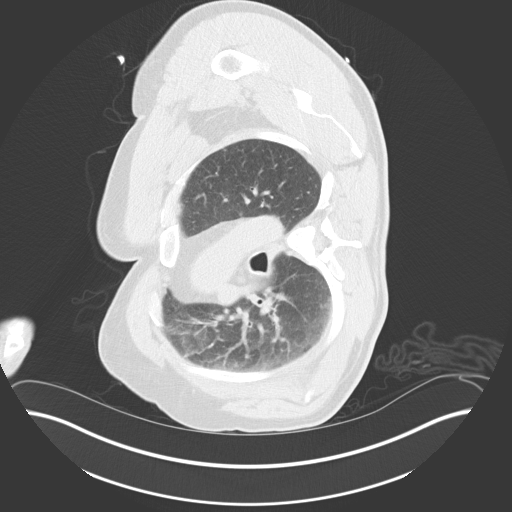
[im 59/68  soft-tissue]
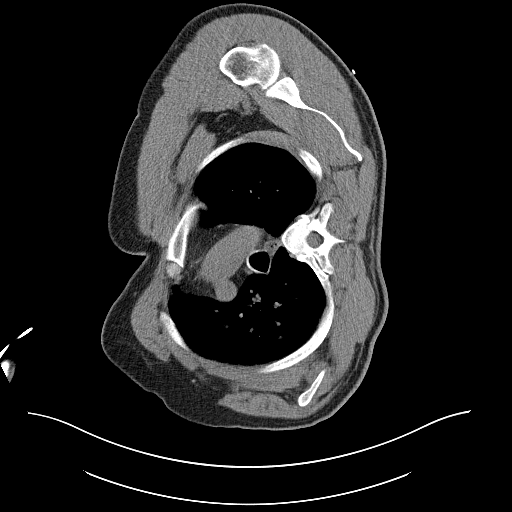
[im 59/68  lung]
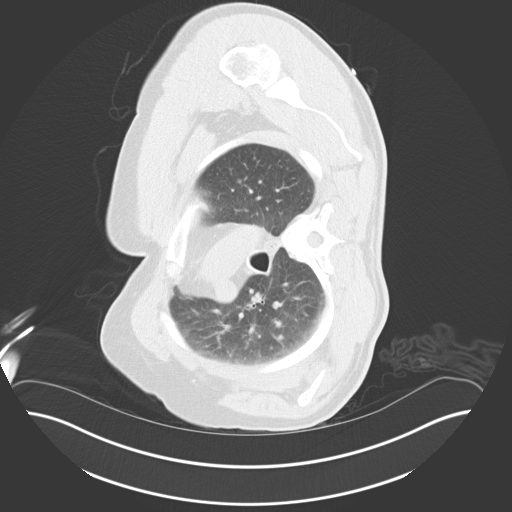
[im 63/68  soft-tissue]
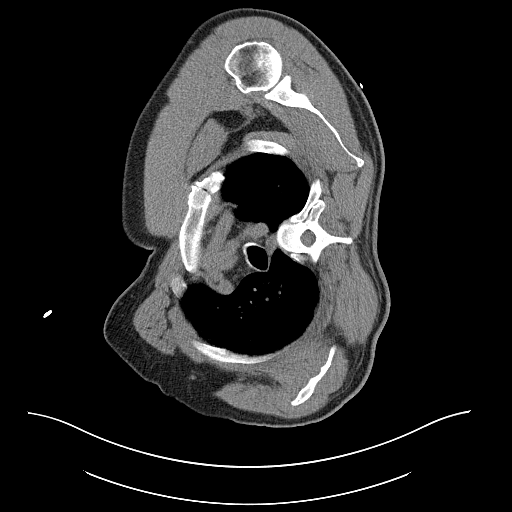
[im 63/68  lung]
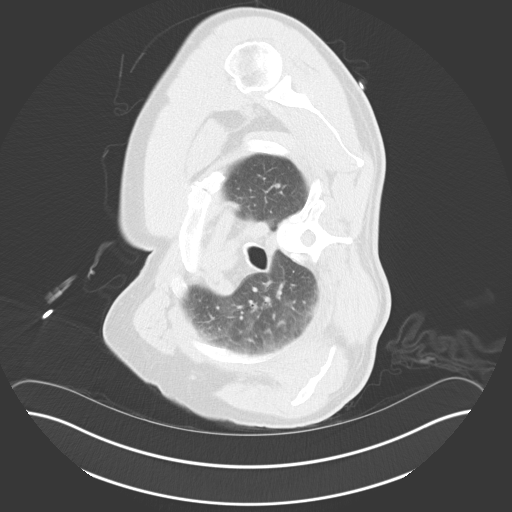

[14 of 32 positions shown; findings below may reference images not displayed]

EXAM:
CT GUIDED CORE BIOPSY OF RIGHT LOWER LOBE LUNG NODULE

ANESTHESIA/SEDATION:
Intravenous Fentanyl and Versed were administered as conscious
sedation during continuous cardiorespiratory monitoring by the
radiology RN, with a total moderate sedation time of 20 minutes.

PROCEDURE:
The procedure risks, benefits, and alternatives were explained to
the patient. Questions regarding the procedure were encouraged and
answered. The patient understands and consents to the procedure.

Patient placed in right lateral decubitus position. Select axial
scans through the thorax were obtained. The target right lower lobe
lesion was localized and an appropriate skin entry site determined
and marked.

The operative field was prepped with Betadinein a sterile fashion,
and a sterile drape was applied covering the operative field. A
sterile gown and sterile gloves were used for the procedure. Local
anesthesia was provided with 1% Lidocaine.

Under CT fluoroscopic guidance, a 17 gauge trocar needle was
advanced to the margin of the lesion. Once needle tip position was
confirmed, coaxial 18-gauge core biopsy samples were obtained,
submitted in formalin to surgical pathology. The guide needle was
removed. Postprocedure scans demonstrate regional alveolar
hemorrhage. There is transient self-limited hemoptysis.

COMPLICATIONS:
Mild hemoptysis, self-limited. SIR level A: No therapy, no
consequence.
FINDINGS: Limited scanning again confirms Bilateral pulmonary nodules. The
dominant cavitary posterior right lower lobe nodule was identified
and percutaneous core biopsy samples obtained.
IMPRESSION: 1. Technically successful CT-guided core biopsy of right lower lobe
lung nodule. Follow-up chest radiography and observation is
scheduled.

## 2015-09-08 ENCOUNTER — Encounter (HOSPITAL_COMMUNITY): Payer: Self-pay | Admitting: Hematology & Oncology

## 2015-09-08 ENCOUNTER — Other Ambulatory Visit (HOSPITAL_COMMUNITY): Payer: Self-pay | Admitting: Oncology

## 2015-09-08 ENCOUNTER — Encounter (HOSPITAL_COMMUNITY): Payer: Medicaid Other | Admitting: Hematology & Oncology

## 2015-09-08 ENCOUNTER — Encounter (HOSPITAL_COMMUNITY): Payer: Medicaid Other | Attending: Hematology & Oncology

## 2015-09-08 VITALS — BP 143/77 | HR 103 | Temp 97.5°F | Resp 20 | Wt 172.2 lb

## 2015-09-08 DIAGNOSIS — C20 Malignant neoplasm of rectum: Secondary | ICD-10-CM | POA: Diagnosis not present

## 2015-09-08 DIAGNOSIS — R918 Other nonspecific abnormal finding of lung field: Secondary | ICD-10-CM | POA: Insufficient documentation

## 2015-09-08 DIAGNOSIS — E876 Hypokalemia: Secondary | ICD-10-CM

## 2015-09-08 DIAGNOSIS — C7800 Secondary malignant neoplasm of unspecified lung: Secondary | ICD-10-CM

## 2015-09-08 LAB — CBC WITH DIFFERENTIAL/PLATELET
BASOS ABS: 0 10*3/uL (ref 0.0–0.1)
Basophils Relative: 0 %
Eosinophils Absolute: 0.1 10*3/uL (ref 0.0–0.7)
Eosinophils Relative: 1 %
HEMATOCRIT: 37.8 % — AB (ref 39.0–52.0)
Hemoglobin: 12.8 g/dL — ABNORMAL LOW (ref 13.0–17.0)
LYMPHS PCT: 8 %
Lymphs Abs: 1.4 10*3/uL (ref 0.7–4.0)
MCH: 30.6 pg (ref 26.0–34.0)
MCHC: 33.9 g/dL (ref 30.0–36.0)
MCV: 90.4 fL (ref 78.0–100.0)
MONO ABS: 1 10*3/uL (ref 0.1–1.0)
Monocytes Relative: 6 %
NEUTROS ABS: 14.3 10*3/uL — AB (ref 1.7–7.7)
NEUTROS PCT: 85 %
PLATELETS: 565 10*3/uL — AB (ref 150–400)
RBC: 4.18 MIL/uL — AB (ref 4.22–5.81)
RDW: 17.1 % — ABNORMAL HIGH (ref 11.5–15.5)
WBC: 16.8 10*3/uL — ABNORMAL HIGH (ref 4.0–10.5)

## 2015-09-08 LAB — COMPREHENSIVE METABOLIC PANEL
ALT: 35 U/L (ref 17–63)
AST: 31 U/L (ref 15–41)
Albumin: 2.8 g/dL — ABNORMAL LOW (ref 3.5–5.0)
Alkaline Phosphatase: 183 U/L — ABNORMAL HIGH (ref 38–126)
Anion gap: 13 (ref 5–15)
BILIRUBIN TOTAL: 0.4 mg/dL (ref 0.3–1.2)
BUN: 7 mg/dL (ref 6–20)
CHLORIDE: 97 mmol/L — AB (ref 101–111)
CO2: 26 mmol/L (ref 22–32)
CREATININE: 0.75 mg/dL (ref 0.61–1.24)
Calcium: 8.8 mg/dL — ABNORMAL LOW (ref 8.9–10.3)
GFR calc Af Amer: >60 mL/min (ref >60)
GLUCOSE: 142 mg/dL — AB (ref 65–99)
Potassium: 3.2 mmol/L — ABNORMAL LOW (ref 3.5–5.1)
Sodium: 136 mmol/L (ref 135–145)
Total Protein: 7.8 g/dL (ref 6.5–8.1)

## 2015-09-08 LAB — URINE MICROSCOPIC-ADD ON: SQUAMOUS EPITHELIAL / LPF: NONE SEEN

## 2015-09-08 LAB — URINALYSIS, ROUTINE W REFLEX MICROSCOPIC
GLUCOSE, UA: NEGATIVE mg/dL
Ketones, ur: NEGATIVE mg/dL
Leukocytes, UA: NEGATIVE
Nitrite: NEGATIVE
PH: 6.5 (ref 5.0–8.0)
Protein, ur: 100 mg/dL — AB
SPECIFIC GRAVITY, URINE: 1.025 (ref 1.005–1.030)

## 2015-09-08 MED ORDER — HEPARIN SOD (PORK) LOCK FLUSH 100 UNIT/ML IV SOLN
500.0000 [IU] | Freq: Once | INTRAVENOUS | Status: AC
  Administered 2015-09-08: 500 [IU] via INTRAVENOUS

## 2015-09-08 MED ORDER — SODIUM CHLORIDE 0.9% FLUSH
10.0000 mL | INTRAVENOUS | Status: DC | PRN
  Administered 2015-09-08: 10 mL via INTRAVENOUS
  Filled 2015-09-08: qty 10

## 2015-09-08 MED ORDER — HEPARIN SOD (PORK) LOCK FLUSH 100 UNIT/ML IV SOLN
INTRAVENOUS | Status: AC
  Filled 2015-09-08: qty 5

## 2015-09-08 MED ORDER — POTASSIUM CHLORIDE CRYS ER 20 MEQ PO TBCR: 20.0000 meq | EXTENDED_RELEASE_TABLET | Freq: Two times a day (BID) | ORAL | Status: DC

## 2015-09-08 NOTE — Patient Instructions (Signed)
No treatment today Return tomorrow to see the doctor

## 2015-09-08 NOTE — Progress Notes (Signed)
Discussed patient pain, rectal discharge and foul odor with Dr. [name]. Patient has not followed up with surgeon post op. He was instructed to see Dr. Arnoldo Morale this am and return 5/10 for possible chemo and to see Dr. [name]. Of note the patient still has foley catheter in place and has dark, cloudy urine. U/A obtained today.

## 2015-09-08 NOTE — Progress Notes (Signed)
Andrew Kline presented for Portacath access and flush.  Proper placement of portacath confirmed by CXR.  Portacath located right  chest wall accessed with  H 20 needle.  Good blood return present. Portacath flushed with 69m NS and 500U/58mHeparin and needle removed intact.  Procedure tolerated well and without incident.

## 2015-09-08 NOTE — Patient Instructions (Signed)
This office note has been dictated.

## 2015-09-09 ENCOUNTER — Inpatient Hospital Stay (HOSPITAL_COMMUNITY): Payer: Medicaid Other

## 2015-09-09 ENCOUNTER — Ambulatory Visit (HOSPITAL_COMMUNITY): Payer: Medicaid Other | Admitting: Hematology & Oncology

## 2015-09-09 LAB — CEA: CEA: 2.1 ng/mL (ref 0.0–4.7)

## 2015-09-09 NOTE — Progress Notes (Signed)
This encounter was created in error - please disregard.

## 2015-09-10 ENCOUNTER — Other Ambulatory Visit (HOSPITAL_COMMUNITY): Payer: Self-pay | Admitting: *Deleted

## 2015-09-10 ENCOUNTER — Encounter (HOSPITAL_COMMUNITY): Payer: Medicaid Other

## 2015-09-11 ENCOUNTER — Encounter (HOSPITAL_COMMUNITY): Payer: Medicaid Other

## 2015-09-15 NOTE — Progress Notes (Signed)
This encounter was created in error - please disregard.

## 2015-09-18 ENCOUNTER — Ambulatory Visit: Payer: Medicaid Other | Admitting: Urology

## 2015-09-22 ENCOUNTER — Ambulatory Visit (INDEPENDENT_AMBULATORY_CARE_PROVIDER_SITE_OTHER): Payer: Medicaid Other | Admitting: Urology

## 2015-09-22 ENCOUNTER — Other Ambulatory Visit: Payer: Self-pay | Admitting: Urology

## 2015-09-22 DIAGNOSIS — N360 Urethral fistula: Secondary | ICD-10-CM

## 2015-09-23 ENCOUNTER — Encounter (HOSPITAL_COMMUNITY): Payer: Self-pay

## 2015-09-23 ENCOUNTER — Other Ambulatory Visit: Payer: Self-pay | Admitting: Urology

## 2015-09-23 ENCOUNTER — Other Ambulatory Visit: Payer: Self-pay | Admitting: General Surgery

## 2015-09-23 ENCOUNTER — Ambulatory Visit (HOSPITAL_COMMUNITY)
Admission: RE | Admit: 2015-09-23 | Discharge: 2015-09-23 | Disposition: A | Discharge status: Home / Self Care | Payer: Medicaid Other | Source: Ambulatory Visit | Attending: Urology | Admitting: Urology

## 2015-09-23 ENCOUNTER — Other Ambulatory Visit (HOSPITAL_COMMUNITY): Payer: Self-pay | Admitting: Urology

## 2015-09-23 DIAGNOSIS — Z85048 Personal history of other malignant neoplasm of rectum, rectosigmoid junction, and anus: Secondary | ICD-10-CM | POA: Diagnosis not present

## 2015-09-23 DIAGNOSIS — R339 Retention of urine, unspecified: Secondary | ICD-10-CM

## 2015-09-23 DIAGNOSIS — Z79899 Other long term (current) drug therapy: Secondary | ICD-10-CM | POA: Insufficient documentation

## 2015-09-23 DIAGNOSIS — Z888 Allergy status to other drugs, medicaments and biological substances status: Secondary | ICD-10-CM | POA: Insufficient documentation

## 2015-09-23 DIAGNOSIS — F419 Anxiety disorder, unspecified: Secondary | ICD-10-CM | POA: Diagnosis not present

## 2015-09-23 DIAGNOSIS — Z87891 Personal history of nicotine dependence: Secondary | ICD-10-CM | POA: Diagnosis not present

## 2015-09-23 DIAGNOSIS — F329 Major depressive disorder, single episode, unspecified: Secondary | ICD-10-CM | POA: Diagnosis not present

## 2015-09-23 DIAGNOSIS — N360 Urethral fistula: Secondary | ICD-10-CM

## 2015-09-23 DIAGNOSIS — Z85528 Personal history of other malignant neoplasm of kidney: Secondary | ICD-10-CM | POA: Insufficient documentation

## 2015-09-23 LAB — CBC
HCT: 38.7 % — ABNORMAL LOW (ref 39.0–52.0)
Hemoglobin: 12.4 g/dL — ABNORMAL LOW (ref 13.0–17.0)
MCH: 28.3 pg (ref 26.0–34.0)
MCHC: 32 g/dL (ref 30.0–36.0)
MCV: 88.4 fL (ref 78.0–100.0)
PLATELETS: 495 10*3/uL — AB (ref 150–400)
RBC: 4.38 MIL/uL (ref 4.22–5.81)
RDW: 17 % — AB (ref 11.5–15.5)
WBC: 9.4 10*3/uL (ref 4.0–10.5)

## 2015-09-23 LAB — APTT: APTT: 38 s — AB (ref 24–37)

## 2015-09-23 LAB — PROTIME-INR
INR: 1.15 (ref 0.00–1.49)
PROTHROMBIN TIME: 14.8 s (ref 11.6–15.2)

## 2015-09-23 MED ORDER — FENTANYL CITRATE (PF) 100 MCG/2ML IJ SOLN
INTRAMUSCULAR | Status: AC | PRN
  Administered 2015-09-23: 25 ug via INTRAVENOUS
  Administered 2015-09-23: 50 ug via INTRAVENOUS

## 2015-09-23 MED ORDER — MIDAZOLAM HCL 2 MG/2ML IJ SOLN
INTRAMUSCULAR | Status: AC
  Filled 2015-09-23: qty 4

## 2015-09-23 MED ORDER — FENTANYL CITRATE (PF) 100 MCG/2ML IJ SOLN
INTRAMUSCULAR | Status: AC
  Filled 2015-09-23: qty 4

## 2015-09-23 MED ORDER — SODIUM CHLORIDE 0.9 % IV SOLN
INTRAVENOUS | Status: AC | PRN
  Administered 2015-09-23: 10 mL/h via INTRAVENOUS

## 2015-09-23 MED ORDER — CIPROFLOXACIN IN D5W 400 MG/200ML IV SOLN
400.0000 mg | INTRAVENOUS | Status: AC
  Administered 2015-09-23: 400 mg via INTRAVENOUS
  Filled 2015-09-23: qty 200

## 2015-09-23 MED ORDER — MIDAZOLAM HCL 2 MG/2ML IJ SOLN
INTRAMUSCULAR | Status: AC | PRN
  Administered 2015-09-23: 1 mg via INTRAVENOUS
  Administered 2015-09-23: 0.5 mg via INTRAVENOUS

## 2015-09-23 MED ORDER — SODIUM CHLORIDE 0.9 % IV SOLN: INTRAVENOUS | Status: DC

## 2015-09-23 NOTE — H&P (Signed)
Chief Complaint: urinary retention  Referring Physician:Dr. Franchot Gallo  Supervising Physician: Sandi Mariscal  Patient Status: Out-pt  HPI: Andrew Kline is an 50 y.o. male with a history of Stage 4 rectal carcinoma who underwent an end colostomy last year.  He was having debilitating rectal pain persist.  Therefore, Dr. Arnoldo Morale did an APR in April of 2017.  During this he made a small knick in the urethra.  Several sutures were placed in this, but the patient has continued to have problems since then.  Urology's notes are not in epic, but according to the patient he had a cystoscopy yesterday and it was determined that he would need a suprapubic catheter. Therefore, he presents today for this procedure.  Past Medical History:  Past Medical History  Diagnosis Date  . Rectal pain   . Arthritis   . Shortness of breath dyspnea   . Anxiety   . Depression   . Kidney stone 03/22/15    left  . Renal cell cancer ([place])     2012.   [name] Rectal cancer [facility])     Past Surgical History:  Past Surgical History  Procedure Laterality Date  . Hypospadias correction    . Laparoscopic partial nephrectomy  2012    right side  . Knee surgery Left     arthroscopy  . Flexible sigmoidoscopy N/A 09/30/2014    Procedure: FLEXIBLE SIGMOIDOSCOPY;  Surgeon: Danie Binder, MD;  Location: [facility];  Service: Endoscopy;  Laterality: N/A;  . Biopsy N/A 09/30/2014    Procedure: BIOPSY;  Surgeon: Danie Binder, MD;  Location: [facility];  Service: Endoscopy;  Laterality: N/A;  Anal Canal  . Flexible sigmoidoscopy N/A 10/17/2014    Procedure: FLEXIBLE SIGMOIDOSCOPY;  Surgeon: Danie Binder, MD;  Location: [facility];  Service: Endoscopy;  Laterality: N/A;  1325  . Lung biopsy Right   . Colostomy N/A 02/11/2015    Procedure: COLOSTOMY;  Surgeon: Aviva Signs, MD;  Location: [facility];  Service: General;  Laterality: N/A;  . Perineal proctectomy N/A 08/26/2015    Procedure:  PROCTECTOMY;  Surgeon: Aviva Signs,  MD;  Location: [facility];  Service: General;  Laterality: N/A;  . Rectal surgery      Family History:  Family History  Problem Relation Age of Onset  . Lung cancer Maternal Grandfather   . Colon cancer Maternal Uncle     age 25  . Diabetes Other   . Healthy Mother     Social History:  reports that he has quit smoking. His smoking use included Cigarettes. He has a 15 pack-year smoking history. He does not have any smokeless tobacco history on file. He reports that he drinks alcohol. He reports that he does not use illicit drugs.  Allergies:  Allergies  Allergen Reactions  . Oxaliplatin Itching    Medications:   Medication List    ASK your doctor about these medications        albuterol 108 (90 Base) MCG/ACT inhaler  Commonly known as:  PROVENTIL HFA;VENTOLIN HFA  Inhale 2 puffs into the lungs every 6 (six) hours as needed for wheezing or shortness of breath.     amitriptyline 50 MG tablet  Commonly known as:  ELAVIL  Take 1 tablet (50 mg total) by mouth at bedtime.     amoxicillin 500 MG tablet  Commonly known as:  AMOXIL  Take 500 mg by mouth 2 (two) times daily.     AVASTIN IV  Inject  into the vein every 14 (fourteen) days. Reported on 09/22/2015     citalopram 40 MG tablet  Commonly known as:  CELEXA  Take 1 tablet (40 mg total) by mouth daily.     dextrose 5 % SOLN 1,000 mL with fluorouracil 5 GM/100ML SOLN  Inject into the vein every 14 (fourteen) days. Reported on 09/22/2015     docusate sodium 100 MG capsule  Commonly known as:  COLACE  Take 1 capsule (100 mg total) by mouth every 12 (twelve) hours.     DULoxetine 30 MG capsule  Commonly known as:  CYMBALTA  Take one capsule daily for 5 days then 2 daily thereafter     fentaNYL 75 MCG/HR  Commonly known as:  DURAGESIC - dosed mcg/hr  Place 2 patches (150 mcg total) onto the skin every 3 (three) days.     HYDROmorphone 4 MG tablet  Commonly known as:  DILAUDID  Take 1 tablet (4 mg total) by mouth every  4 (four) hours as needed for severe pain.     IRINOTECAN HCL IV  Inject into the vein every 14 (fourteen) days. Reported on 06/09/2015     leucovorin 50 MG injection  Inject 50 mg into the vein every 14 (fourteen) days. To start November 05, 2014     lidocaine-prilocaine cream  Commonly known as:  EMLA  Apply a quarter size amount to port site 1 hour prior to chemo. Do not rub in. Cover with plastic wrap.     LYRICA 75 MG capsule  Generic drug:  pregabalin  TAKE 2 CAPSULES BY MOUTH TWICE DAILY.     ondansetron 4 MG disintegrating tablet  Commonly known as:  ZOFRAN ODT  '4mg'$  ODT q4 hours prn nausea/vomit     potassium chloride SA 20 MEQ tablet  Commonly known as:  K-DUR,KLOR-CON  Take 1 tablet (20 mEq total) by mouth 2 (two) times daily.     prochlorperazine 10 MG tablet  Commonly known as:  COMPAZINE  Take 1 tablet (10 mg total) by mouth every 6 (six) hours as needed for nausea or vomiting.     zolpidem 10 MG tablet  Commonly known as:  AMBIEN  Take 1 tablet (10 mg total) by mouth at bedtime.        Please HPI for pertinent positives, otherwise complete 10 system ROS negative.  Mallampati Score: MD Evaluation Airway: WNL Heart: WNL Abdomen: Other (comments) Abdomen comments: LLQ colostomy Chest/ Lungs: WNL ASA  Classification: 3 Mallampati/Airway Score: One  Physical Exam: BP 138/97 mmHg  Temp(Src) 98.4 F (36.9 C) (Oral)  Resp 22  Ht '5\' 5"'$  (1.651 m)  Wt 172 lb (78.019 kg)  BMI 28.62 kg/m2  SpO2 99% Body mass index is 28.62 kg/(m^2). General: pleasant, WD, WN white male who is laying in bed in NAD HEENT: head is normocephalic, atraumatic.  Sclera are noninjected.  PERRL.  Ears and nose without any masses or lesions.  Mouth is pink, but dry with very poor dentition Heart: regular, rate, and rhythm.  Normal s1,s2. No obvious murmurs, gallops, or rubs noted.  Palpable radial and pedal pulses bilaterally Lungs: CTAB, no wheezes, rhonchi, or rales noted.  Respiratory  effort nonlabored Abd: soft, NT, ND, +BS, LLQ colostomy in place, midline sacr from recent ex lap. no masses, hernias, or organomegaly MS: all 4 extremities are symmetrical with no cyanosis, clubbing, or edema. Psych: A&Ox3 with an appropriate affect.   Labs: Results for orders placed or performed during the hospital encounter  of 09/23/15 (from the past 48 hour(s))  APTT     Status: Abnormal   Collection Time: 09/23/15  9:00 AM  Result Value Ref Range   aPTT 38 (H) 24 - 37 seconds    Comment:        IF BASELINE aPTT IS ELEVATED, SUGGEST PATIENT RISK ASSESSMENT BE USED TO DETERMINE APPROPRIATE ANTICOAGULANT THERAPY.   CBC     Status: Abnormal   Collection Time: 09/23/15  9:00 AM  Result Value Ref Range   WBC 9.4 4.0 - 10.5 K/uL   RBC 4.38 4.22 - 5.81 MIL/uL   Hemoglobin 12.4 (L) 13.0 - 17.0 g/dL   HCT 38.7 (L) 39.0 - 52.0 %   MCV 88.4 78.0 - 100.0 fL   MCH 28.3 26.0 - 34.0 pg   MCHC 32.0 30.0 - 36.0 g/dL   RDW 17.0 (H) 11.5 - 15.5 %   Platelets 495 (H) 150 - 400 K/uL  Protime-INR     Status: None   Collection Time: 09/23/15  9:00 AM  Result Value Ref Range   Prothrombin Time 14.8 11.6 - 15.2 seconds   INR 1.15 0.00 - 1.49    Imaging: No results found.  Assessment/Plan 1. Ureteral injury -we will plan to do a CT-guided suprapubic catheter placement today -he has been NPO -labs and vitals reviewed -Risks and Benefits discussed with the patient including bleeding, infection, damage to adjacent structures, and sepsis. All of the patient's questions were answered, patient is agreeable to proceed. Consent signed and in chart.   Thank you for this interesting consult.  I greatly enjoyed meeting Jamiel Goncalves and look forward to participating in their care.  A copy of this report was sent to the requesting provider on this date.  Electronically Signed: Henreitta Cea 09/23/2015, 10:01 AM   I spent a total of  30 Minutes   in face to face in clinical consultation,  greater than 50% of which was counseling/coordinating care for ureteral injury needing suprapubic catheter

## 2015-09-23 NOTE — Procedures (Signed)
Technically successful CT guided placed of a 12 Fr drainage catheter placement into the urinary bladder yielding return of clear, non-foul smelling urine.   EBL: None No immediate post procedural complications.   Ronny Bacon, MD Pager #: [phone]

## 2015-09-23 NOTE — Discharge Instructions (Signed)
Suprapubic Catheter Replacement, Care After Refer to this sheet in the next few weeks. These instructions provide you with information on caring for yourself after changing your catheter. Your caregiver may also give you more specific instructions. Call your caregiver if you have any problems or questions after you change your catheter. HOME CARE INSTRUCTIONS   Take all medicines prescribed by your caregiver. Follow the directions carefully.  Drink 8 glasses of water every day. This produces good urine flow.  Check the skin around your catheter a few times every day. Watch for redness and swelling. Look for any fluids coming out of the opening.  Do not use powder or cream around the catheter opening.  Do not take tub baths or use pools or hot tubs.  Keep all follow-up appointments. SEEK MEDICAL CARE IF:   You leak urine.  Your skin around the catheter becomes red or sore.  Your urine flow slows down.  Your urine gets cloudy or smelly. SEEK IMMEDIATE MEDICAL CARE IF:   You have chills, nausea, or back pain.  You have trouble changing your catheter.  Your catheter comes out.  You have blood in your urine.  You have no urine flow for 1 hour.  You have a fever.   This information is not intended to replace advice given to you by your health care provider. Make sure you discuss any questions you have with your health care provider.   Document Released: 01/04/2011 Document Revised: 05/09/2014 Document Reviewed: 01/04/2011 Elsevier Interactive Patient Education [employer].

## 2015-09-24 ENCOUNTER — Encounter: Payer: Self-pay | Admitting: *Deleted

## 2015-09-24 NOTE — Progress Notes (Signed)
[place] Clinical Social Work  Clinical Social Work was referred by need for CSW follow up due to possible care needs at home and for re-assessment of needs due to patient missing appts at [facility]. Clinical Social Worker contacted patient at home to offer support and assess for needs.  Pt reports he has had complications from his surgery with Dr Arnoldo Morale. Pt reports he did not have follow up at [facility] as he was told by Dr Arnoldo Morale that things were on hold after his surgery. Pt had [facility] coming out, but this was to end shortly until complications from surgery. Pt very open to having CNA services in place through [facility]. CSW will discuss with the team and get them to place referral. Pt reports [facility] RN arrived and they will be assisting with urinary drainage complications.     Clinical Social Work interventions: Supportive listening Resource assistance  [facility] Tuesdays   [phone]

## 2015-09-29 ENCOUNTER — Other Ambulatory Visit (HOSPITAL_COMMUNITY): Payer: Self-pay | Admitting: Oncology

## 2015-09-29 ENCOUNTER — Telehealth (HOSPITAL_COMMUNITY): Payer: Self-pay | Admitting: *Deleted

## 2015-09-29 DIAGNOSIS — G4700 Insomnia, unspecified: Secondary | ICD-10-CM

## 2015-09-29 DIAGNOSIS — C7800 Secondary malignant neoplasm of unspecified lung: Secondary | ICD-10-CM

## 2015-09-29 DIAGNOSIS — K6289 Other specified diseases of anus and rectum: Secondary | ICD-10-CM

## 2015-09-29 DIAGNOSIS — C20 Malignant neoplasm of rectum: Secondary | ICD-10-CM

## 2015-09-29 MED ORDER — FENTANYL 75 MCG/HR TD PT72: 150.0000 ug | MEDICATED_PATCH | TRANSDERMAL | Status: DC

## 2015-09-29 MED ORDER — ZOLPIDEM TARTRATE 10 MG PO TABS: 10.0000 mg | ORAL_TABLET | Freq: Every day | ORAL | Status: DC

## 2015-09-29 MED ORDER — AMITRIPTYLINE HCL 50 MG PO TABS: 50.0000 mg | ORAL_TABLET | Freq: Every day | ORAL | Status: DC

## 2015-09-29 MED ORDER — DULOXETINE HCL 60 MG PO CPEP: 60.0000 mg | ORAL_CAPSULE | Freq: Every day | ORAL | Status: DC

## 2015-09-29 NOTE — Telephone Encounter (Signed)
Meds are ready for pick-up.  Some are escribed.  TK

## 2015-10-06 ENCOUNTER — Encounter (HOSPITAL_COMMUNITY): Payer: Medicaid Other | Attending: Hematology & Oncology | Admitting: Hematology & Oncology

## 2015-10-06 VITALS — BP 143/74 | HR 83 | Temp 99.0°F | Resp 20 | Wt 169.6 lb

## 2015-10-06 DIAGNOSIS — C7801 Secondary malignant neoplasm of right lung: Secondary | ICD-10-CM | POA: Diagnosis not present

## 2015-10-06 DIAGNOSIS — K6289 Other specified diseases of anus and rectum: Secondary | ICD-10-CM

## 2015-10-06 DIAGNOSIS — C20 Malignant neoplasm of rectum: Secondary | ICD-10-CM | POA: Diagnosis not present

## 2015-10-06 DIAGNOSIS — C7802 Secondary malignant neoplasm of left lung: Secondary | ICD-10-CM

## 2015-10-06 DIAGNOSIS — R918 Other nonspecific abnormal finding of lung field: Secondary | ICD-10-CM | POA: Insufficient documentation

## 2015-10-06 DIAGNOSIS — Z9359 Other cystostomy status: Secondary | ICD-10-CM

## 2015-10-06 DIAGNOSIS — Z720 Tobacco use: Secondary | ICD-10-CM

## 2015-10-06 DIAGNOSIS — C7800 Secondary malignant neoplasm of unspecified lung: Secondary | ICD-10-CM

## 2015-10-06 MED ORDER — HYDROMORPHONE HCL 4 MG PO TABS: 4.0000 mg | ORAL_TABLET | ORAL | Status: DC | PRN

## 2015-10-06 NOTE — Progress Notes (Signed)
Andrew Kline at 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    09/25/2014 Imaging CT abd/pelvis- Indeterminate nodules within the bilateral lung bases with dominant approximately 1.7 cm centrally necrotic nodule within the right lower lobe...   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   10/03/2014 Tumor Marker CEA NOT ELEVATED   10/09/2014 Imaging CT Chest- Bilateral pulmonary nodules of varying size are most consistent with pulmonary metastasis. There are approximately 6 lesions in each lung.   10/17/2014 Pathology Results Rectum, biopsy - INVASIVE ADENOCARCINOMA   10/29/2014 Pathology Results Diagnosis Lung, needle/core biopsy(ies), right lower lobe - METASTATIC ADENOCARCINOMA   10/29/2014 Pathology Results KRAS wildtype   11/04/2014 - 12/18/2014 Radiation Therapy Dr. Lisbeth Renshaw   11/05/2014 - 05/26/2015 Chemotherapy FOLFOX + Avastin   02/11/2015 Surgery Colostomy formation by Dr. Arnoldo Morale.   02/26/2015 Imaging CT CAP- Interval response to therapy with decrease in size of multiple bilateral pulmonary nodules. No new or progressive pulmonary metastasis.  No evidence for metastatic disease within the abdomen or pelvis.    03/22/2015 Imaging CT RENAL STONE- There is an obstructing 4 mm stone within the distal left ureter resulting in moderate left hydroureteronephrosis.  Re- demonstrated pulmonary nodules within the right lower lobe and lingula, compatible with known metastasis.   05/20/2015 Progression CT Scans demonstrate pulmonary progression of  disease.   05/20/2015 Imaging CT CAP- Mild progression of bilateral pulmonary metastases, including a 1.9 cm cavitary nodule in the right lower lobe with increasing solid component.   06/23/2015 - 07/21/2015 Chemotherapy FOLFIRI + Avastin   08/26/2015 Surgery Abdominoperineal resection (palliative)   08/26/2015 Pathology Results microscopic focus of adenocarcinoma of colon, tumor invades submucosa   09/23/2015 Procedure  Temporary Suprapubic catheter placement IR secondary to small knick in urethra during APR, urinary retention    HISTORY OF PRESENTING ILLNESS:  Andrew Kline 50 y.o. male is here because of stage IV rectal cancer.  He has undergone a palliative APR, unfortunately had several complications but is now on the mend.  Andrew Kline is accompanied by his mother and sister today.   His rectal pain is almost gone. Now it comes and goes as it is healing. He is now comfortable physically to sit down.   He is now eating pretty well. His weight is fairly stable. Home health has been helpful. He is still on the pain patches and takes dilaudid every four hours as needed. He plans to attend physical therapy as he admits he is not as strong as he used to be and wants to work on this. He would like to not have to use a cane to walk.   He continues to smoke. Patient reports he has cut down quite a bit. His mother reports he almost chain smokes when he is stressed or in a lot of pain.  His mother notes he looks much better than the last time she saw him and he is able to eat better. He is also staying awake and sitting up more. He is anxious to start back on chemotherapy.   He denies nausea, vomiting. No  headaches. Pain is gradually improving. Chronic cough. No CP.   MEDICAL HISTORY:  Past Medical History  Diagnosis Date  . Rectal pain   . Arthritis   . Shortness of breath dyspnea   . Anxiety   . Depression   . Kidney stone 03/22/15    left  . Renal cell cancer ([place])     2012.   [name] Rectal  cancer [facility])     SURGICAL HISTORY: Past Surgical History  Procedure Laterality Date  . Hypospadias correction    . Laparoscopic partial nephrectomy  2012    right side  . Knee surgery Left     arthroscopy  . Flexible sigmoidoscopy N/A 09/30/2014    Procedure: FLEXIBLE SIGMOIDOSCOPY;  Surgeon: Danie Binder, MD;  Location: [facility];  Service: Endoscopy;  Laterality: N/A;  . Biopsy N/A 09/30/2014    Procedure: BIOPSY;  Surgeon: Danie Binder, MD;  Location: [facility];  Service: Endoscopy;  Laterality: N/A;  Anal Canal  . Flexible sigmoidoscopy N/A 10/17/2014    Procedure: FLEXIBLE SIGMOIDOSCOPY;  Surgeon: Danie Binder, MD;  Location: [facility] SU[facility];  Service: Endoscopy;  Laterality: N/A;  1325  . Lung biopsy Right   . Colostomy N/A 02/11/2015    Procedure: COLOSTOMY;  Surgeon: Aviva Signs, MD;  Location: [facility];  Service: General;  Laterality: N/A;  . Perineal proctectomy N/A 08/26/2015    Procedure:  PROCTECTOMY;  Surgeon: Aviva Signs, MD;  Location: [facility];  Service: General;  Laterality: N/A;  . Rectal surgery      SOCIAL HISTORY: Social History   Social History  . Marital Status: Divorced    Spouse Name: N/A  . Number of Children: 2  . Years of Education: N/A   Occupational History  . retail    Social History Main Topics  . Smoking status: Former Smoker -- 0.50 packs/day for 30 years    Types: Cigarettes  . Smokeless tobacco: Not on file     Comment: a little over a pack daily  . Alcohol Use: 0.0 oz/week    0 Standard drinks or equivalent per week     Comment: Occ  . Drug Use: No  . Sexual Activity: Yes    Birth Control/ Protection: None   Other Topics Concern  . Not on file   Social History Narrative  He is from [place], [place]. Moved here in March. Smoker, 1 ppd. Was 1.5 ppd. ETOH, none. 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 He is divorced. Lives with  fiance. 2 children. 20 and 107 yo. No grandchildren.  FAMILY HISTORY: Family History  Problem Relation Age of Onset  . Lung cancer Maternal Grandfather   . Colon cancer Maternal Uncle     age 58  . Diabetes Other   . Healthy Mother    indicated that his mother is alive. He indicated that his father is alive.   Mother living, 63 yo. Still working as a Audiological scientist. Father living, 77-70 yo. 1 brother, 1 sister, 1 step sister. Maternal uncle has colon cancer. He is married to a GI nurse. So he has people to go to with questions and is informed on what is happening. History of diabetes in the family. No history of heart disease in the family.  ALLERGIES:  is allergic to oxaliplatin.  MEDICATIONS:  Current Outpatient Prescriptions  Medication Sig Dispense Refill  . albuterol (PROVENTIL HFA;VENTOLIN HFA)  108 (90 Base) MCG/ACT inhaler Inhale 2 puffs into the lungs every 6 (six) hours as needed for wheezing or shortness of breath. 1 Inhaler 2  . amitriptyline (ELAVIL) 50 MG tablet Take 1 tablet (50 mg total) by mouth at bedtime. 30 tablet 2  . docusate sodium (COLACE) 100 MG capsule Take 1 capsule (100 mg total) by mouth every 12 (twelve) hours. 60 capsule 0  . DULoxetine (CYMBALTA) 60 MG capsule Take 1 capsule (60 mg total) by mouth daily. 30 capsule 3  . fentaNYL (DURAGESIC - DOSED MCG/HR) 75 MCG/HR Place 2 patches (150 mcg total) onto the skin every 3 (three) days. 20 patch 0  . HYDROmorphone (DILAUDID) 4 MG tablet Take 1 tablet (4 mg total) by mouth every 4 (four) hours as needed for severe pain. 60 tablet 0  . lidocaine-prilocaine (EMLA) cream Apply a quarter size amount to port site 1 hour prior to chemo. Do not rub in. Cover with plastic wrap. (Patient taking differently: Apply 1 application topically once. Apply a quarter size amount to port site 1 hour prior to chemo. Do not rub in. Cover with plastic wrap.) 30 g 3  . LYRICA 75 MG capsule TAKE 2 CAPSULES BY MOUTH TWICE DAILY. 120  capsule 3  . zolpidem (AMBIEN) 10 MG tablet Take 1 tablet (10 mg total) by mouth at bedtime. 30 tablet 2  . ondansetron (ZOFRAN ODT) 4 MG disintegrating tablet 68m ODT q4 hours prn nausea/vomit (Patient not taking: Reported on 10/06/2015) 20 tablet 0  . prochlorperazine (COMPAZINE) 10 MG tablet Take 1 tablet (10 mg total) by mouth every 6 (six) hours as needed for nausea or vomiting. (Patient not taking: Reported on 10/06/2015) 60 tablet 0   No current facility-administered medications for this visit.   Facility-Administered Medications Ordered in Other Visits  Medication Dose Route Frequency Provider Last Rate Last Dose  . 0.9 %  sodium chloride infusion   Intravenous Continuous SPatrici Ranks MD   Stopped at 08/26/15 1435    Review of Systems  Constitutional: Positive for malaise/fatigue HENT: Negative. Respiratory: Negative. Gastrointestinal: Positive for rectal pain. Negative for blood in stool.  Rectal pain much improved. Genitourinary: Negative Psychiatric/Behavioral: Positive for ANXIETY. 14 point ROS was done and is otherwise as detailed above or in HPI    PHYSICAL EXAMINATION:  ECOG PERFORMANCE STATUS: 1 - Symptomatic but completely ambulatory   Filed Vitals:   10/06/15 1418  BP: 143/74  Pulse: 83  Temp: 99 F (37.2 C)  Resp: 20   Physical Exam  Constitutional: He is oriented to person, place, and time and well-developed, well-nourished, and in no distress. Ambulates with use of cane. HENT:  Head: Normocephalic and atraumatic.  Nose: Nose normal.  Mouth/Throat: Oropharynx is clear and moist. No oropharyngeal exudate.  Dentures on top.  Eyes: Conjunctivae and EOM are normal. Pupils are equal, round, and reactive to light. Right eye exhibits no discharge. Left eye exhibits no discharge. No scleral icterus.  Neck: Normal range of motion. Neck supple. No tracheal deviation present. No thyromegaly present.  Cardiovascular: Normal rate, regular rhythm and normal heart  sounds.  Exam reveals no gallop and no friction rub.   No murmur heard. Pulmonary/Chest: Normal Abdominal: Soft. Bowel sounds are normal. He exhibits no distension and no mass. There is no tenderness. There is no rebound and no guarding. Ostomy is c/d/i  Genitourinary:   Musculoskeletal: Normal range of motion. He exhibits no edema.  Lymphadenopathy:    He has no cervical adenopathy.  Neurological: He is alert and oriented to person, place, and time. He has normal reflexes. No cranial nerve deficit. Gait normal. Coordination normal.  Skin: Skin is warm and dry. No rash noted.  Psychiatric: Mood, memory, affect and judgment normal.  Nursing note and vitals reviewed.   LABORATORY DATA:  I have reviewed the data as listed Lab Results  Component Value Date   WBC 9.4 09/23/2015   HGB 12.4* 09/23/2015   HCT 38.7* 09/23/2015   MCV 88.4 09/23/2015   PLT 495* 09/23/2015   CMP     Component Value Date/Time   NA 136 09/08/2015 0903   K 3.2* 09/08/2015 0903   CL 97* 09/08/2015 0903   CO2 26 09/08/2015 0903   GLUCOSE 142* 09/08/2015 0903   BUN 7 09/08/2015 0903   CREATININE 0.75 09/08/2015 0903   CALCIUM 8.8* 09/08/2015 0903   PROT 7.8 09/08/2015 0903   ALBUMIN 2.8* 09/08/2015 0903   AST 31 09/08/2015 0903   ALT 35 09/08/2015 0903   ALKPHOS 183* 09/08/2015 0903   BILITOT 0.4 09/08/2015 0903   GFRNONAA >60 09/08/2015 0903   GFRAA >60 09/08/2015 0903    RADIOLOGY: I have personally reviewed the radiological images as listed and agreed with the findings in the report. CLINICAL DATA: Rectal cancer with metastatic disease to lung. Cough and short of breath  EXAM: CHEST 2 VIEW  COMPARISON: Chest two views 10/31/2014. Chest CT 05/20/2015  FINDINGS: Port-A-Cath tip in the SVC unchanged. Heart size and vascularity normal. Negative for pneumonia or effusion.  Small lung nodules are best seen on the recent CT scan. Cavitary nodule right posterior lung base unchanged from  the recent CT.  IMPRESSION: Cavitary nodule right lower lobe as noted on CT. Other small nodules best seen on CT.   Electronically Signed  By: Franchot Gallo M.D.  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 Anal carcinoma, adenocarcinoma Pulmonary metastases Good PS History of partial nephrectomy 2012 at [facility] in [place] FOLFOX/AVASTIN XRT for palliation Colostomy for palliation of poor bowel control Lyrica for pain, with marked improvement in QOL and pain scores Contact Dermatitis from Fentanyl patch Weight loss Bronchitis -- resolved FOLFIRI/AVASTIN S/P palliative APR TEMP suprapubic catheter    The patient's rectal pain is much better controlled. Hopefully he will be able to move forward with additional chemotherapy in the next few weeks. I have recommended repeating staging in several weeks and regrouping post for treatment recommendations at that time.  I have refilled his dilaudid. Dr. Arnoldo Morale was notified I will resume refilling the patient's pain medication.  Suprapubic drainage catheter placed by Dr. Diona Fanti on 09/23/2015. This is temporary. I advised the patient to continue to follow closely with urology.   Plan will be to restart FOLFIRI +/- AVASTIN. Hopefully he will still respond. He is KRAS WT, will check on MSI testing as well.   This document serves as a record of services personally performed by Ancil Linsey, MD. It was created on her behalf by Arlyce Harman, a trained medical scribe. The creation of this record is based on the scribe's personal observations and the provider's statements to them. This document has been checked and approved by the attending provider.  I have reviewed the above documentation for accuracy and completeness, and I agree with the above.   This note was electronically signed.   Kelby Fam. [name], MD

## 2015-10-06 NOTE — Patient Instructions (Addendum)
[place] at [facility] Discharge Instructions  RECOMMENDATIONS MADE BY THE CONSULTANT AND ANY TEST RESULTS WILL BE SENT TO YOUR REFERRING PHYSICIAN.   CT scans in 1 month  Return to clinic after CT scans with labs  Message left with [facility] dietitian regarding another case of Ensure   Thank you for choosing [employer] at [facility] to provide your oncology and hematology care.  To afford each patient quality time with our provider, please arrive at least 15 minutes before your scheduled appointment time.   Beginning January 23rd 2017 lab work for the [facility] will be done in the  Main lab at [facility] on 1st floor. If you have a lab appointment with the [facility] please come in thru the  Main Entrance and check in at the main information desk  You need to re-schedule your appointment should you arrive 10 or more minutes late.  We strive to give you quality time with our providers, and arriving late affects you and other patients whose appointments are after yours.  Also, if you no show three or more times for appointments you may be dismissed from the clinic at the providers discretion.     Again, thank you for choosing [facility].  Our hope is that these requests will decrease the amount of time that you wait before being seen by our physicians.       _____________________________________________________________  Should you have questions after your visit to [facility], please contact our office at [phone] between the hours of 8:30 a.m. and 4:30 p.m.  Voicemails left after 4:30 p.m. will not be returned until the following business day.  For prescription refill requests, have your pharmacy contact our office.         Resources For Cancer Patients and their Caregivers ? American Cancer Society: Can assist with transportation, wigs, general needs, runs Look Good Feel Better.         772-755-9622 ? Cancer Care: Provides financial assistance, online support groups, medication/co-pay assistance.  1-800-813-HOPE 5088202212) ? [facility] Assists [place] cancer patients and their families through emotional , educational and financial support.  (581)566-9590 ? [place] DSS Where to apply for food stamps, Medicaid and utility assistance. 531-153-3136 ? [facility]: Transportation to medical appointments. 437-153-5780 ? Social Security Administration: May apply for disability if have a Stage IV cancer. 380 657 7181 979-253-2495 ? [employer], Disability and Transit Services: Assists with nutrition, care and transit needs. [place] Support Programs: '@10RELATIVEDAYS'$ @ > Cancer Support Group  2nd Tuesday of the month 1pm-2pm, Journey Room  > Creative Journey  3rd Tuesday of the month 1130am-1pm, Journey Room  > Look Good Feel Better  1st Wednesday of the month 10am-12 noon, Journey Room (Call [place] to register [phone])

## 2015-10-07 ENCOUNTER — Encounter (HOSPITAL_COMMUNITY): Payer: Self-pay | Admitting: Hematology & Oncology

## 2015-10-09 ENCOUNTER — Encounter: Payer: Self-pay | Admitting: Dietician

## 2015-10-09 NOTE — Progress Notes (Signed)
Oncology nursing had mentioned pt wanted his last Ensure case. RD called pt, but got voicemail. Left message with contract information/  Andrew Kline RD, LDN, CNSC Clinical Nutrition Pager: [phone] 10/09/2015 1:59 PM

## 2015-10-14 ENCOUNTER — Ambulatory Visit (INDEPENDENT_AMBULATORY_CARE_PROVIDER_SITE_OTHER): Payer: Medicaid Other | Admitting: Urology

## 2015-10-14 DIAGNOSIS — N360 Urethral fistula: Secondary | ICD-10-CM

## 2015-10-14 DIAGNOSIS — S3720XA Unspecified injury of bladder, initial encounter: Secondary | ICD-10-CM

## 2015-10-15 ENCOUNTER — Encounter (HOSPITAL_COMMUNITY): Payer: Self-pay | Admitting: Hematology & Oncology

## 2015-10-16 ENCOUNTER — Other Ambulatory Visit (HOSPITAL_COMMUNITY): Payer: Self-pay | Admitting: Oncology

## 2015-10-16 DIAGNOSIS — C20 Malignant neoplasm of rectum: Secondary | ICD-10-CM

## 2015-10-16 DIAGNOSIS — C7800 Secondary malignant neoplasm of unspecified lung: Secondary | ICD-10-CM

## 2015-10-16 DIAGNOSIS — K6289 Other specified diseases of anus and rectum: Secondary | ICD-10-CM

## 2015-10-16 MED ORDER — HYDROMORPHONE HCL 4 MG PO TABS: 4.0000 mg | ORAL_TABLET | ORAL | Status: DC | PRN

## 2015-10-20 ENCOUNTER — Other Ambulatory Visit: Payer: Self-pay | Admitting: Urology

## 2015-10-20 DIAGNOSIS — N360 Urethral fistula: Secondary | ICD-10-CM

## 2015-10-21 ENCOUNTER — Other Ambulatory Visit (HOSPITAL_COMMUNITY): Payer: Self-pay | Admitting: Emergency Medicine

## 2015-11-04 ENCOUNTER — Ambulatory Visit (HOSPITAL_COMMUNITY)
Admission: RE | Admit: 2015-11-04 | Discharge: 2015-11-04 | Disposition: A | Discharge status: Home / Self Care | Payer: Medicaid Other | Source: Ambulatory Visit | Attending: Hematology & Oncology | Admitting: Hematology & Oncology

## 2015-11-04 DIAGNOSIS — K6289 Other specified diseases of anus and rectum: Secondary | ICD-10-CM | POA: Diagnosis present

## 2015-11-04 DIAGNOSIS — K861 Other chronic pancreatitis: Secondary | ICD-10-CM | POA: Insufficient documentation

## 2015-11-04 DIAGNOSIS — I700 Atherosclerosis of aorta: Secondary | ICD-10-CM | POA: Diagnosis not present

## 2015-11-04 DIAGNOSIS — C7800 Secondary malignant neoplasm of unspecified lung: Secondary | ICD-10-CM | POA: Insufficient documentation

## 2015-11-04 DIAGNOSIS — I2510 Atherosclerotic heart disease of native coronary artery without angina pectoris: Secondary | ICD-10-CM | POA: Insufficient documentation

## 2015-11-04 DIAGNOSIS — C20 Malignant neoplasm of rectum: Secondary | ICD-10-CM | POA: Insufficient documentation

## 2015-11-04 LAB — POCT I-STAT CREATININE: CREATININE: 0.7 mg/dL (ref 0.61–1.24)

## 2015-11-04 MED ORDER — IOPAMIDOL (ISOVUE-300) INJECTION 61%
100.0000 mL | Freq: Once | INTRAVENOUS | Status: AC | PRN
  Administered 2015-11-04: 100 mL via INTRAVENOUS

## 2015-11-04 NOTE — Progress Notes (Signed)
CRITICAL VALUE ALERT  Critical value received:  CT results  Date of notification:  11/04/15  Time of notification:  7948  Critical value read back:Yes.    Nurse who received alert:  Donavan Foil, RN  Notified Dr. [name] of CT results called by [facility] Radiology.

## 2015-11-06 ENCOUNTER — Encounter (HOSPITAL_COMMUNITY): Payer: Self-pay | Admitting: Hematology & Oncology

## 2015-11-06 ENCOUNTER — Other Ambulatory Visit (HOSPITAL_COMMUNITY): Payer: Self-pay | Admitting: *Deleted

## 2015-11-06 ENCOUNTER — Encounter (HOSPITAL_COMMUNITY): Payer: Medicaid Other

## 2015-11-06 ENCOUNTER — Encounter (HOSPITAL_COMMUNITY): Payer: Medicaid Other | Attending: Hematology & Oncology | Admitting: Hematology & Oncology

## 2015-11-06 VITALS — BP 109/81 | HR 82 | Temp 98.7°F | Resp 18 | Wt 178.8 lb

## 2015-11-06 DIAGNOSIS — Z720 Tobacco use: Secondary | ICD-10-CM

## 2015-11-06 DIAGNOSIS — R918 Other nonspecific abnormal finding of lung field: Secondary | ICD-10-CM | POA: Insufficient documentation

## 2015-11-06 DIAGNOSIS — C7800 Secondary malignant neoplasm of unspecified lung: Principal | ICD-10-CM

## 2015-11-06 DIAGNOSIS — C7801 Secondary malignant neoplasm of right lung: Secondary | ICD-10-CM

## 2015-11-06 DIAGNOSIS — C20 Malignant neoplasm of rectum: Secondary | ICD-10-CM

## 2015-11-06 DIAGNOSIS — K6289 Other specified diseases of anus and rectum: Secondary | ICD-10-CM

## 2015-11-06 DIAGNOSIS — R188 Other ascites: Secondary | ICD-10-CM

## 2015-11-06 DIAGNOSIS — Z95828 Presence of other vascular implants and grafts: Secondary | ICD-10-CM

## 2015-11-06 DIAGNOSIS — C7802 Secondary malignant neoplasm of left lung: Secondary | ICD-10-CM | POA: Diagnosis not present

## 2015-11-06 LAB — CBC WITH DIFFERENTIAL/PLATELET
BASOS PCT: 0 %
Basophils Absolute: 0 10*3/uL (ref 0.0–0.1)
Eosinophils Absolute: 0.4 10*3/uL (ref 0.0–0.7)
Eosinophils Relative: 6 %
HEMATOCRIT: 34.5 % — AB (ref 39.0–52.0)
HEMOGLOBIN: 10.9 g/dL — AB (ref 13.0–17.0)
LYMPHS ABS: 0.8 10*3/uL (ref 0.7–4.0)
Lymphocytes Relative: 12 %
MCH: 26.7 pg (ref 26.0–34.0)
MCHC: 31.6 g/dL (ref 30.0–36.0)
MCV: 84.6 fL (ref 78.0–100.0)
MONOS PCT: 15 %
Monocytes Absolute: 1 10*3/uL (ref 0.1–1.0)
NEUTROS ABS: 4.6 10*3/uL (ref 1.7–7.7)
NEUTROS PCT: 67 %
Platelets: 442 10*3/uL — ABNORMAL HIGH (ref 150–400)
RBC: 4.08 MIL/uL — ABNORMAL LOW (ref 4.22–5.81)
RDW: 17.8 % — ABNORMAL HIGH (ref 11.5–15.5)
WBC: 6.8 10*3/uL (ref 4.0–10.5)

## 2015-11-06 LAB — COMPREHENSIVE METABOLIC PANEL
ALBUMIN: 2.9 g/dL — AB (ref 3.5–5.0)
ALK PHOS: 101 U/L (ref 38–126)
ALT: 15 U/L — ABNORMAL LOW (ref 17–63)
ANION GAP: 5 (ref 5–15)
AST: 14 U/L — ABNORMAL LOW (ref 15–41)
BILIRUBIN TOTAL: 0.5 mg/dL (ref 0.3–1.2)
CALCIUM: 8 mg/dL — AB (ref 8.9–10.3)
CO2: 28 mmol/L (ref 22–32)
CREATININE: 0.57 mg/dL — AB (ref 0.61–1.24)
Chloride: 99 mmol/L — ABNORMAL LOW (ref 101–111)
GFR calc Af Amer: >60 mL/min (ref >60)
GFR calc non Af Amer: >60 mL/min (ref >60)
GLUCOSE: 102 mg/dL — AB (ref 65–99)
Potassium: 3.6 mmol/L (ref 3.5–5.1)
Sodium: 132 mmol/L — ABNORMAL LOW (ref 135–145)
TOTAL PROTEIN: 6.6 g/dL (ref 6.5–8.1)

## 2015-11-06 MED ORDER — SODIUM CHLORIDE 0.9% FLUSH
10.0000 mL | INTRAVENOUS | Status: DC | PRN
  Administered 2015-11-06: 10 mL via INTRAVENOUS
  Filled 2015-11-06: qty 10

## 2015-11-06 MED ORDER — HEPARIN SOD (PORK) LOCK FLUSH 100 UNIT/ML IV SOLN
INTRAVENOUS | Status: AC
  Filled 2015-11-06: qty 5

## 2015-11-06 MED ORDER — HYDROMORPHONE HCL 4 MG PO TABS: ORAL_TABLET | ORAL | Status: DC

## 2015-11-06 MED ORDER — HEPARIN SOD (PORK) LOCK FLUSH 100 UNIT/ML IV SOLN
500.0000 [IU] | Freq: Once | INTRAVENOUS | Status: AC
  Administered 2015-11-06: 500 [IU] via INTRAVENOUS

## 2015-11-06 NOTE — Progress Notes (Signed)
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    09/25/2014 Imaging CT abd/pelvis- Indeterminate nodules within the bilateral lung bases with dominant approximately 1.7 cm centrally necrotic nodule within the right lower lobe...   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   10/03/2014 Tumor Marker CEA NOT ELEVATED   10/09/2014 Imaging CT Chest- Bilateral pulmonary nodules of varying size are most consistent with pulmonary metastasis. There are approximately 6 lesions in each lung.   10/17/2014 Pathology Results Rectum, biopsy - INVASIVE ADENOCARCINOMA   10/29/2014 Pathology Results Diagnosis Lung, needle/core biopsy(ies), right lower lobe - METASTATIC ADENOCARCINOMA   10/29/2014 Pathology Results KRAS wildtype   11/04/2014 - 12/18/2014 Radiation Therapy Dr. Lisbeth Renshaw   11/05/2014 - 05/26/2015 Chemotherapy FOLFOX + Avastin   02/11/2015 Surgery Colostomy formation by Dr. Arnoldo Morale.   02/26/2015 Imaging CT CAP- Interval response to therapy with decrease in size of multiple bilateral pulmonary nodules. No new or progressive pulmonary metastasis.  No evidence for metastatic disease within the abdomen or pelvis.    03/22/2015 Imaging CT RENAL STONE- There is an obstructing 4 mm stone within the distal left ureter resulting in moderate left hydroureteronephrosis.  Re- demonstrated pulmonary nodules within the right lower lobe and lingula, compatible with known metastasis.   05/20/2015 Progression CT Scans demonstrate pulmonary progression of  disease.   05/20/2015 Imaging CT CAP- Mild progression of bilateral pulmonary metastases, including a 1.9 cm cavitary nodule in the right lower lobe with increasing solid component.   06/23/2015 - 07/21/2015 Chemotherapy FOLFIRI + Avastin   08/26/2015 Surgery Abdominoperineal resection (palliative)   08/26/2015 Pathology Results microscopic focus of adenocarcinoma of colon, tumor invades submucosa   09/23/2015 Procedure  Temporary Suprapubic catheter placement IR secondary to small knick in urethra during APR, urinary retention    HISTORY OF PRESENTING ILLNESS:  Elek Holderness 50 y.o. male is here because of stage IV rectal cancer.  He has undergone a palliative APR, unfortunately had several complications but is now on the mend.  Mr. Griffie [facility] [place] unaccompanied.   He confirms that Dr. Arnoldo Morale talked to him about his scans.  He believes he's having his superpubic foley catheter changed on the 13th.   He isn't set up to have the area drained with Dr. Arnoldo Morale yet. He understands he has to have "the fluid collection drained."  He confirms that he's eating. "My appetite has not been the greatest." He confirms that he's drinking tons of fluid, but that he's having issues that he wants to talk to his urologist about. He notes that he periodically gets blood in his catheter that is "all red." He says "if I do a lot of bending over, that's when the blood happens here." He isn't sure if it's because he's bending over or what.  He confirms that he's still on the fentanyl patch; 2 every 3 days.  With regards to his pain, he notes that, in spite of the lyrica and the fentanyl, his dilaudid is what controls his rectal and penile pain.  He just received 60 dilaudid on  the 21st. He notes that he's laying down 90% of the time, but when he moves, (he says he's been trying to move around a little more since he's lost a lot of muscle tone) it hurts. He says "everything down there hurts." Even  though he takes the ambien at night, he usually ends up having to take two dilaudid at 2 or 3 in the morning. He confirms that Dr. Arnoldo Morale is writing him dilaudid.  He is currently smoking a quarter pack a day.   MEDICAL HISTORY:  Past Medical History  Diagnosis Date  . Rectal pain   . Arthritis   . Shortness of breath dyspnea   . Anxiety   . Depression   . Kidney stone 03/22/15    left  . Renal cell cancer ([place])     2012.   [name] Rectal cancer [facility])     SURGICAL HISTORY: Past Surgical History  Procedure Laterality Date  . Hypospadias correction    . Laparoscopic partial nephrectomy  2012    right side  . Knee surgery Left     arthroscopy  . Flexible sigmoidoscopy N/A 09/30/2014    Procedure: FLEXIBLE SIGMOIDOSCOPY;  Surgeon: Danie Binder, MD;  Location: [facility] ORS;  Service: Endoscopy;  Laterality: N/A;  . Biopsy N/A 09/30/2014    Procedure: BIOPSY;  Surgeon: Danie Binder, MD;  Location: [facility] ORS;  Service: Endoscopy;  Laterality: N/A;  Anal Canal  . Flexible sigmoidoscopy N/A 10/17/2014    Procedure: FLEXIBLE SIGMOIDOSCOPY;  Surgeon: Danie Binder, MD;  Location: [facility] ENDO SUITE;  Service: Endoscopy;  Laterality: N/A;  1325  . Lung biopsy Right   . Colostomy N/A 02/11/2015    Procedure: COLOSTOMY;  Surgeon: Aviva Signs, MD;  Location: [facility] ORS;  Service: General;  Laterality: N/A;  . Perineal proctectomy N/A 08/26/2015    Procedure:  PROCTECTOMY;  Surgeon: Aviva Signs, MD;  Location: [facility] ORS;  Service: General;  Laterality: N/A;  . Rectal surgery      SOCIAL HISTORY: Social History   Social History  . Marital Status: Divorced    Spouse Name: N/A  . Number of Children: 2  . Years of Education: N/A   Occupational History  . retail    Social History Main Topics  . Smoking status: Former Smoker -- 0.50 packs/day for 30 years    Types: Cigarettes  . Smokeless tobacco: Not on file     Comment: a little over a pack daily  . Alcohol Use: 0.0 oz/week    0 Standard drinks or  equivalent per week     Comment: Occ  . Drug Use: No  . Sexual Activity: Yes    Birth Control/ Protection: None   Other Topics Concern  . Not on file   Social History Narrative  He is from [place], [place]. Moved here in March. Smoker, 1 ppd. Was 1.5 ppd. ETOH, none. 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 He is divorced. Lives with fiance. 2 children. 68 and 7 yo. No grandchildren.  FAMILY HISTORY: Family History  Problem Relation Age of Onset  . Lung cancer Maternal Grandfather   . Colon cancer Maternal Uncle     age 30  . Diabetes Other   . Healthy Mother    indicated that his mother is alive. He indicated that his father is alive.   Mother living, 35 yo. Still working as a Audiological scientist.  Father living, 15-70 yo. 1 brother, 1 sister, 1 step sister. Maternal uncle has colon cancer. He is married to a GI nurse. So he has people to go to with questions and is informed on what is happening. History of diabetes in the family. No history of heart disease in the family.  ALLERGIES:  is allergic to oxaliplatin.  MEDICATIONS:  Current Outpatient Prescriptions  Medication Sig Dispense Refill  . albuterol (PROVENTIL HFA;VENTOLIN HFA) 108 (90 Base) MCG/ACT inhaler Inhale 2 puffs into the lungs every 6 (six) hours as needed for wheezing or shortness of breath. 1 Inhaler 2  . amitriptyline (ELAVIL) 50 MG tablet Take 1 tablet (50 mg total) by mouth at bedtime. 30 tablet 2  . docusate sodium (COLACE) 100 MG capsule Take 1 capsule (100 mg total) by mouth every 12 (twelve) hours. 60 capsule 0  . DULoxetine (CYMBALTA) 60 MG capsule Take 1 capsule (60 mg total) by mouth daily. 30 capsule 3  . fentaNYL (DURAGESIC - DOSED MCG/HR) 75 MCG/HR Place 2 patches (150 mcg total) onto the skin every 3 (three) days. 20 patch 0  . HYDROmorphone (DILAUDID) 4 MG tablet Take one tablet by mouth every 4 hours  for pain 60 tablet 0  . lidocaine-prilocaine (EMLA) cream Apply a quarter size amount to port site 1 hour prior to chemo. Do not rub in. Cover with plastic wrap. (Patient taking differently: Apply 1 application topically once. Apply a quarter size amount to port site 1 hour prior to chemo. Do not rub in. Cover with plastic wrap.) 30 g 3  . LYRICA 75 MG capsule TAKE 2 CAPSULES BY MOUTH TWICE DAILY. 120 capsule 3  . mirabegron ER (MYRBETRIQ) 50 MG TB24 tablet Take 50 mg by mouth daily.    . ondansetron (ZOFRAN ODT) 4 MG disintegrating tablet 36m ODT q4 hours prn nausea/vomit 20 tablet 0  . prochlorperazine (COMPAZINE) 10 MG tablet Take 1 tablet (10 mg total) by mouth every 6 (six) hours as needed for nausea or vomiting. 60 tablet 0  . zolpidem (AMBIEN) 10 MG tablet Take 1 tablet (10 mg total) by mouth at bedtime. 30 tablet 2   Current Facility-Administered Medications  Medication Dose Route Frequency Provider Last Rate Last Dose  . heparin lock flush 100 unit/mL  500 Units Intravenous Once SPatrici Ranks MD      . sodium chloride flush (NS) 0.9 % injection 10 mL  10 mL Intravenous PRN SPatrici Ranks MD       Facility-Administered Medications Ordered in Other Visits  Medication Dose Route Frequency Provider Last Rate Last Dose  . 0.9 %  sodium chloride infusion   Intravenous Continuous SPatrici Ranks MD   Stopped at 08/26/15 1435    Review of Systems  Constitutional: Positive for malaise/fatigue HENT: Negative. Respiratory: Negative. Gastrointestinal: Positive for rectal pain. Negative for blood in stool.  Genitourinary: Negative Psychiatric/Behavioral: Positive for ANXIETY. 14 point ROS was done and is otherwise as detailed above or in HPI    PHYSICAL EXAMINATION:  ECOG PERFORMANCE STATUS: 1 - Symptomatic but completely ambulatory   Filed Vitals:   11/06/15 1028  BP: 109/81  Pulse: 82  Temp: 98.7 F (37.1 C)  Resp: 18   Physical Exam  Constitutional: He is oriented  to person, place, and time and well-developed,  and in no distress. Ambulates with use of cane. HENT:  Head: Normocephalic and atraumatic.  Nose: Nose normal.  Mouth/Throat: Oropharynx is clear and moist. No oropharyngeal exudate.  Dentures  on top.  Eyes: Conjunctivae and EOM are normal. Pupils are equal, round, and reactive to light. Right eye exhibits no discharge. Left eye exhibits no discharge. No scleral icterus.  Neck: Normal range of motion. Neck supple. No tracheal deviation present. No thyromegaly present.  Cardiovascular: Normal rate, regular rhythm and normal heart sounds.  Exam reveals no gallop and no friction rub.   No murmur heard. Pulmonary/Chest: Normal Abdominal: Soft. Bowel sounds are normal. He exhibits no distension and no mass. There is no tenderness. There is no rebound and no guarding. Ostomy is c/d/i  Suprapubic catheter Genitourinary:   Musculoskeletal: Normal range of motion. He exhibits no edema.  Lymphadenopathy:    He has no cervical adenopathy.  Neurological: He is alert and oriented to person, place, and time. He has normal reflexes. No cranial nerve deficit. Gait normal. Coordination normal.  Skin: Skin is warm and dry. No rash noted.  Psychiatric: Mood, memory, affect and judgment normal.  Nursing note and vitals reviewed.   LABORATORY DATA:  I have reviewed the data as listed Results for GARRIS, MELHORN (MRN [id number])   Ref. Range 11/06/2015 12:45  Sodium Latest Ref Range: 135 - 145 mmol/L 132 (L)  Potassium Latest Ref Range: 3.5 - 5.1 mmol/L 3.6  Chloride Latest Ref Range: 101 - 111 mmol/L 99 (L)  CO2 Latest Ref Range: 22 - 32 mmol/L 28  BUN Latest Ref Range: 6 - 20 mg/dL <5 (L)  Creatinine Latest Ref Range: 0.61 - 1.24 mg/dL 0.57 (L)  Calcium Latest Ref Range: 8.9 - 10.3 mg/dL 8.0 (L)  EGFR (Non-African Amer.) Latest Ref Range: >60 mL/min >60  EGFR (African American) Latest Ref Range: >60 mL/min >60  Glucose Latest Ref Range: 65 - 99 mg/dL 102 (H)   Anion gap Latest Ref Range: 5 - 15  5  Alkaline Phosphatase Latest Ref Range: 38 - 126 U/L 101  Albumin Latest Ref Range: 3.5 - 5.0 g/dL 2.9 (L)  AST Latest Ref Range: 15 - 41 U/L 14 (L)  ALT Latest Ref Range: 17 - 63 U/L 15 (L)  Total Protein Latest Ref Range: 6.5 - 8.1 g/dL 6.6  Total Bilirubin Latest Ref Range: 0.3 - 1.2 mg/dL 0.5  WBC Latest Ref Range: 4.0 - 10.5 K/uL 6.8  RBC Latest Ref Range: 4.22 - 5.81 MIL/uL 4.08 (L)  Hemoglobin Latest Ref Range: 13.0 - 17.0 g/dL 10.9 (L)  HCT Latest Ref Range: 39.0 - 52.0 % 34.5 (L)  MCV Latest Ref Range: 78.0 - 100.0 fL 84.6  MCH Latest Ref Range: 26.0 - 34.0 pg 26.7  MCHC Latest Ref Range: 30.0 - 36.0 g/dL 31.6  RDW Latest Ref Range: 11.5 - 15.5 % 17.8 (H)  Platelets Latest Ref Range: 150 - 400 K/uL 442 (H)  Neutrophils Latest Units: % 67  Lymphocytes Latest Units: % 12  Monocytes Relative Latest Units: % 15  Eosinophil Latest Units: % 6  Basophil Latest Units: % 0  NEUT# Latest Ref Range: 1.7 - 7.7 K/uL 4.6  Lymphocyte # Latest Ref Range: 0.7 - 4.0 K/uL 0.8  Monocyte # Latest Ref Range: 0.1 - 1.0 K/uL 1.0  Eosinophils Absolute Latest Ref Range: 0.0 - 0.7 K/uL 0.4   Results for CORD, WILCZYNSKI (MRN [id number])   Ref. Range 07/21/2015 10:00 08/18/2015 08:35 09/08/2015 09:03 11/06/2015 12:45  CEA Latest Ref Range: 0.0 - 4.7 ng/mL 3.4 3.2 2.1 3.9     RADIOLOGY: I have personally reviewed the radiological images as listed and agreed with the findings  in the report.  Study Result     CLINICAL DATA: Restaging stage IV colorectal carcinoma. Ureter lacerated during surgery, status post repair.  EXAM: CT CHEST, ABDOMEN, AND PELVIS WITH CONTRAST  TECHNIQUE: Multidetector CT imaging of the chest, abdomen and pelvis was performed following the standard protocol during bolus administration of intravenous contrast.  CONTRAST: 180m ISOVUE-300 IOPAMIDOL (ISOVUE-300) INJECTION 61%  COMPARISON: CT chest abdomen pelvis  05/20/2015.  FINDINGS: CT CHEST FINDINGS  Mediastinum/Lymph Nodes: Right IJ Port-A-Cath terminates in the low SVC. Mediastinal lymph nodes are not enlarged by CT size criteria. No hilar or axillary adenopathy. Coronary artery calcification. Heart size normal. No pericardial effusion.  Lungs/Pleura: There are new and enlarging irregular nodules in the lungs. Index new nodule in the right lower lobe measures 1.5 cm (series 5, image 88). No pleural fluid. Airway is unremarkable.  Musculoskeletal: No worrisome lytic or sclerotic lesions.  CT ABDOMEN PELVIS FINDINGS  Hepatobiliary: Liver and gallbladder are unremarkable. No biliary ductal dilatation.  Pancreas: Calcifications are seen throughout pancreas.  Spleen: Negative.  Adrenals/Urinary Tract: Adrenal glands are unremarkable. Question postoperative changes along the lower pole right kidney. Kidneys are otherwise unremarkable. Ureters are decompressed.  Stomach/Bowel: Stomach, small bowel and majority of the colon are unremarkable. Abdominal perineal resection with a left lower quadrant colostomy and a parastomal hernia containing unobstructed small bowel. A collection of fluid and air is seen in the presacral space, measuring 5.0 x 5.4 cm, new from 05/20/2015. On nephrographic phase imaging, there is no increase in attenuation within the fluid collection.  Vascular/Lymphatic: Atherosclerotic calcification of the arterial vasculature without abdominal aortic aneurysm. No pathologically enlarged lymph nodes.  Reproductive: Prostate is visualized.  Other: Presacral collection of fluid and air is discussed above. Mesenteries and peritoneum are otherwise unremarkable. Small left inguinal hernia contains fat.  Musculoskeletal: No worrisome lytic or sclerotic lesions.  IMPRESSION: 1. Presacral collection of fluid and air is new from 05/20/2015 and worrisome for an abscess in this patient status post  abdominal perineal resection on 08/26/2015. Urinoma is not excluded. These results will be called to the ordering clinician or representative by the Radiologist Assistant, and communication documented in the PACS or zVision Dashboard. 2. Worsening pulmonary metastatic disease. 3. Coronary artery calcification. 4. Chronic calcific pancreatitis. 5. Aortic atherosclerosis.   Electronically Signed  By: MLorin PicketM.D.  On: 11/04/2015 14:35        ASSESSMENT & PLAN:  STAGE IV Rectal vs. Anal carcinoma, adenocarcinoma Pulmonary metastases Good PS [facility] [place]AVAST[place] for palliation Colostomy for palliation of poor bowel control Lyrica for pain, with marked improvement in QOL and pain scores Contact Dermatitis from Fentanyl patch Weight loss Bronchitis -- resolved FOLFIRI/AVASTIN S/P palliative APR TEMP suprapubic catheter  Pre-sacral fluid collection vs. abscess   He has evidence of progressive disease on imaging. Realistically not surprising given time off therapy. Unfortunately given pre-sacral fluid collection vs. Abscess this will further delay start of therapy. He is working with Dr. JArnoldo Moralein regards to IR guided drainage.   Suprapubic drainage catheter placed by Dr. DDiona Fantion 09/23/2015. This is temporary. I advised the patient to continue to follow closely with urology.   Plan will be to restart FOLFIRI +/- AVASTIN at some point.  Hopefully he will still respond. He is KRAS WT, will check on MSI testing as well.   His use of dilaudid was discussed. I advised him that he needs to get it from one  provider. In addition, moving forward this is going to have to be addressed.   We will see him back in a couple of weeks. He will hopefully begin chemotherapy at that time.  I will need him to sign a release to get Mackenzie's notes from [facility] as well.  Orders Placed This  Encounter  Procedures  . CBC with Differential    Standing Status: Future     Number of Occurrences:      Standing Expiration Date: 11/05/2016  . CEA    Standing Status: Future     Number of Occurrences:      Standing Expiration Date: 11/05/2016  . Comprehensive metabolic panel    Standing Status: Future     Number of Occurrences:      Standing Expiration Date: 11/05/2016   This document serves as a record of services personally performed by Ancil Linsey, MD. It was created on her behalf by Toni Amend, a trained medical scribe. The creation of this record is based on the scribe's personal observations and the provider's statements to them. This document has been checked and approved by the attending provider.  I have reviewed the above documentation for accuracy and completeness, and I agree with the above.   This note was electronically signed.   Kelby Fam. [name], MD

## 2015-11-06 NOTE — Patient Instructions (Signed)
[place] at [facility] Discharge Instructions  RECOMMENDATIONS MADE BY THE CONSULTANT AND ANY TEST RESULTS WILL BE SENT TO YOUR REFERRING PHYSICIAN.  You were seen by Dr.Penland today. Return to Clinic in 3 weeks and see Tom with labs and Chemo. Please call the clinic with your concerns.  Thank you for choosing [place] at [facility] to provide your oncology and hematology care.  To afford each patient quality time with our provider, please arrive at least 15 minutes before your scheduled appointment time.   Beginning January 23rd 2017 lab work for the [facility] will be done in the  Main lab at [facility] on 1st floor. If you have a lab appointment with the [place] please come in thru the  Main Entrance and check in at the main information desk  You need to re-schedule your appointment should you arrive 10 or more minutes late.  We strive to give you quality time with our providers, and arriving late affects you and other patients whose appointments are after yours.  Also, if you no show three or more times for appointments you may be dismissed from the clinic at the providers discretion.     Again, thank you for choosing [facility].  Our hope is that these requests will decrease the amount of time that you wait before being seen by our physicians.       _____________________________________________________________  Should you have questions after your visit to [facility], please contact our office at [phone] between the hours of 8:30 a.m. and 4:30 p.m.  Voicemails left after 4:30 p.m. will not be returned until the following business day.  For prescription refill requests, have your pharmacy contact our office.         Resources For Cancer Patients and their Caregivers ? American Cancer Society: Can assist with transportation, wigs, general needs, runs Look Good Feel Better.         870-754-7914 ? Cancer Care: Provides financial assistance, online support groups, medication/co-pay assistance.  1-800-813-HOPE 209-757-0569) ? [place] Assists [place] cancer patients and their families through emotional , educational and financial support.  743-098-7088 ? [place] DSS Where to apply for food stamps, Medicaid and utility assistance. (214) 163-3491 ? [facility]: Transportation to medical appointments. (289)748-2659 ? Social Security Administration: May apply for disability if have a Stage IV cancer. 707-353-8051 814 458 1274 ? [employer], Disability and Transit Services: Assists with nutrition, care and transit needs. [place] Support Programs: '@10RELATIVEDAYS'$ @ > Cancer Support Group  2nd Tuesday of the month 1pm-2pm, Journey Room  > Creative Journey  3rd Tuesday of the month 1130am-1pm, Journey Room  > Look Good Feel Better  1st Wednesday of the month 10am-12 noon, Journey Room (Call Marcus to register [phone])

## 2015-11-06 NOTE — Progress Notes (Signed)
Andrew Kline presented for Portacath access and flush. Proper placement of portacath confirmed by CXR. Portacath located in the right chest wall accessed with  H 20 needle. Clean, dry and intact Good blood return present. Portacath flushed with 73m NS and 500U/55mHeparin per protocol and needle removed intact. Procedure without incident. Patient tolerated procedure well.

## 2015-11-07 LAB — CEA: CEA: 3.9 ng/mL (ref 0.0–4.7)

## 2015-11-11 ENCOUNTER — Other Ambulatory Visit: Payer: Self-pay | Admitting: Radiology

## 2015-11-12 ENCOUNTER — Ambulatory Visit (HOSPITAL_COMMUNITY)
Admission: RE | Admit: 2015-11-12 | Discharge: 2015-11-12 | Disposition: A | Discharge status: Home / Self Care | Payer: Medicaid Other | Source: Ambulatory Visit | Attending: Urology | Admitting: Urology

## 2015-11-12 ENCOUNTER — Ambulatory Visit (HOSPITAL_COMMUNITY)
Admission: RE | Admit: 2015-11-12 | Discharge: 2015-11-12 | Disposition: A | Discharge status: Home / Self Care | Payer: Medicaid Other | Source: Ambulatory Visit | Attending: General Surgery | Admitting: General Surgery

## 2015-11-12 ENCOUNTER — Other Ambulatory Visit (HOSPITAL_COMMUNITY): Payer: Self-pay | Admitting: General Surgery

## 2015-11-12 ENCOUNTER — Encounter (HOSPITAL_COMMUNITY): Payer: Self-pay

## 2015-11-12 ENCOUNTER — Other Ambulatory Visit: Payer: Self-pay | Admitting: Radiology

## 2015-11-12 DIAGNOSIS — C19 Malignant neoplasm of rectosigmoid junction: Secondary | ICD-10-CM | POA: Diagnosis not present

## 2015-11-12 DIAGNOSIS — Z933 Colostomy status: Secondary | ICD-10-CM | POA: Diagnosis not present

## 2015-11-12 DIAGNOSIS — Z4803 Encounter for change or removal of drains: Secondary | ICD-10-CM | POA: Insufficient documentation

## 2015-11-12 DIAGNOSIS — K651 Peritoneal abscess: Secondary | ICD-10-CM | POA: Insufficient documentation

## 2015-11-12 DIAGNOSIS — C20 Malignant neoplasm of rectum: Secondary | ICD-10-CM

## 2015-11-12 DIAGNOSIS — N360 Urethral fistula: Secondary | ICD-10-CM

## 2015-11-12 DIAGNOSIS — R188 Other ascites: Secondary | ICD-10-CM

## 2015-11-12 DIAGNOSIS — C7800 Secondary malignant neoplasm of unspecified lung: Secondary | ICD-10-CM | POA: Insufficient documentation

## 2015-11-12 LAB — CBC WITH DIFFERENTIAL/PLATELET
BASOS PCT: 0 %
Basophils Absolute: 0 10*3/uL (ref 0.0–0.1)
EOS ABS: 0.2 10*3/uL (ref 0.0–0.7)
Eosinophils Relative: 2 %
HEMATOCRIT: 38 % — AB (ref 39.0–52.0)
Hemoglobin: 12.3 g/dL — ABNORMAL LOW (ref 13.0–17.0)
LYMPHS ABS: 1 10*3/uL (ref 0.7–4.0)
Lymphocytes Relative: 11 %
MCH: 26.7 pg (ref 26.0–34.0)
MCHC: 32.4 g/dL (ref 30.0–36.0)
MCV: 82.4 fL (ref 78.0–100.0)
MONO ABS: 0.9 10*3/uL (ref 0.1–1.0)
MONOS PCT: 10 %
Neutro Abs: 6.9 10*3/uL (ref 1.7–7.7)
Neutrophils Relative %: 77 %
Platelets: 399 10*3/uL (ref 150–400)
RBC: 4.61 MIL/uL (ref 4.22–5.81)
RDW: 18.3 % — AB (ref 11.5–15.5)
WBC: 9 10*3/uL (ref 4.0–10.5)

## 2015-11-12 LAB — PROTIME-INR
INR: 1.12 (ref 0.00–1.49)
PROTHROMBIN TIME: 14.2 s (ref 11.6–15.2)

## 2015-11-12 MED ORDER — MIDAZOLAM HCL 2 MG/2ML IJ SOLN
INTRAMUSCULAR | Status: AC
  Filled 2015-11-12: qty 4

## 2015-11-12 MED ORDER — FENTANYL CITRATE (PF) 100 MCG/2ML IJ SOLN
INTRAMUSCULAR | Status: AC | PRN
  Administered 2015-11-12: 25 ug via INTRAVENOUS

## 2015-11-12 MED ORDER — MIDAZOLAM HCL 2 MG/2ML IJ SOLN
INTRAMUSCULAR | Status: AC
  Filled 2015-11-12: qty 6

## 2015-11-12 MED ORDER — FENTANYL CITRATE (PF) 100 MCG/2ML IJ SOLN
INTRAMUSCULAR | Status: AC | PRN
  Administered 2015-11-12: 50 ug via INTRAVENOUS

## 2015-11-12 MED ORDER — FENTANYL CITRATE (PF) 100 MCG/2ML IJ SOLN
INTRAMUSCULAR | Status: AC
  Filled 2015-11-12: qty 4

## 2015-11-12 MED ORDER — SODIUM CHLORIDE 0.9 % IV SOLN
INTRAVENOUS | Status: DC
  Administered 2015-11-12: 10:00:00 via INTRAVENOUS

## 2015-11-12 MED ORDER — MIDAZOLAM HCL 2 MG/2ML IJ SOLN
INTRAMUSCULAR | Status: AC | PRN
  Administered 2015-11-12 (×3): 1 mg via INTRAVENOUS

## 2015-11-12 NOTE — Discharge Instructions (Signed)
Moderate Conscious Sedation, Adult °Sedation is the use of medicines to promote relaxation and relieve discomfort and anxiety. Moderate conscious sedation is a type of sedation. Under moderate conscious sedation you are less alert than normal but are still able to respond to instructions or stimulation. Moderate conscious sedation is used during short medical and dental procedures. It is milder than deep sedation or general anesthesia and allows you to return to your regular activities sooner. °LET YOUR HEALTH CARE PROVIDER KNOW ABOUT:  °· Any allergies you have. °· All medicines you are taking, including vitamins, herbs, eye drops, creams, and over-the-counter medicines. °· Use of steroids (by mouth or creams). °· Previous problems you or members of your family have had with the use of anesthetics. °· Any blood disorders you have. °· Previous surgeries you have had. °· Medical conditions you have. °· Possibility of pregnancy, if this applies. °· Use of cigarettes, alcohol, or illegal drugs. °RISKS AND COMPLICATIONS °Generally, this is a safe procedure. However, as with any procedure, problems can occur. Possible problems include: °· Oversedation. °· Trouble breathing on your own. You may need to have a breathing tube until you are awake and breathing on your own. °· Allergic reaction to any of the medicines used for the procedure. °BEFORE THE PROCEDURE °· You may have blood tests done. These tests can help show how well your kidneys and liver are working. They can also show how well your blood clots. °· A physical exam will be done.   °· Only take medicines as directed by your health care provider. You may need to stop taking medicines (such as blood thinners, aspirin, or nonsteroidal anti-inflammatory drugs) before the procedure.   °· Do not eat or drink at least 6 hours before the procedure or as directed by your health care provider. °· Arrange for a responsible adult, family member, or friend to take you home  after the procedure. He or she should stay with you for at least 24 hours after the procedure, until the medicine has worn off. °PROCEDURE  °· An intravenous (IV) catheter will be inserted into one of your veins. Medicine will be able to flow directly into your body through this catheter. You may be given medicine through this tube to help prevent pain and help you relax. °· The medical or dental procedure will be done. °AFTER THE PROCEDURE °· You will stay in a recovery area until the medicine has worn off. Your blood pressure and pulse will be checked.   °·  Depending on the procedure you had, you may be allowed to go home when you can tolerate liquids and your pain is under control. °  °This information is not intended to replace advice given to you by your health care provider. Make sure you discuss any questions you have with your health care provider. °  °Document Released: 01/11/2001 Document Revised: 05/09/2014 Document Reviewed: 12/24/2012 °Elsevier Interactive Patient Education ©2016 [employer] ° °

## 2015-11-12 NOTE — Procedures (Signed)
Successful LT TRANSGLUTEAL PRESACRAL ABSCESS DRAIN WITH CT GUIDANCE  Successful suprapubic cath exchg with CT guidance also  NO COMP STABLE FULL REPORT IN PACS

## 2015-11-12 NOTE — Progress Notes (Signed)
Rowe Robert, PA in room teaching patient to flush (58m) and change JP drain dressing daily. Patient also instructed to empty and record output. Patient verbalizes understanding.

## 2015-11-12 NOTE — Progress Notes (Signed)
Patient ID: Andrew Kline, male   DOB: Sep 06, 1965, 50 y.o.   MRN: [id number]    Referring Physician(s): McKenzie,Patrick L/Jenkins,M  Supervising Physician: Daryll Brod  Patient Status:  Outpatient  Chief Complaint: "I'm here for a catheter change and abscess drain"   Subjective: Pt familiar to IR service from  right chest wall Port-A-Cath placement in 2016, and suprapubic catheter placement on 09/23/15. 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 Due to persistent rectal pain he underwent APR in April 2017 with subsequent urethral tear. Sutures were placed but patient continued to have problems and subsequently underwent suprapubic catheter placement. He continues to have "brown sludge " draining from penis. 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 There was also worsening pulmonary metastatic disease. He presents again today for suprapubic catheter exchange and CT-guided aspiration/possible drainage of the presacral fluid collection. He currently denies fever, headache, chest pain, dyspnea, cough, abdominal pain, nausea, vomiting or abnormal bleeding.   Allergies: Oxaliplatin  Medications: Prior to Admission medications   Medication Sig Start Date End Date Taking? Authorizing Provider  amitriptyline (ELAVIL) 50 MG tablet Take 1 tablet (50 mg total) by mouth at bedtime. 09/29/15  Yes Baird Cancer, PA-C  DULoxetine (CYMBALTA) 60 MG capsule Take 1 capsule (60 mg total) by mouth daily. 09/29/15  Yes Manon Hilding Kefalas, PA-C  fentaNYL (DURAGESIC - DOSED MCG/HR) 75 MCG/HR Place 2 patches (150 mcg total) onto the skin every 3 (three) days. 09/29/15  Yes Baird Cancer, PA-C  HYDROmorphone (DILAUDID) 4 MG tablet Take one tablet by mouth every 4 hours for pain 11/06/15  Yes Patrici Ranks, MD  LYRICA 75 MG capsule TAKE 2 CAPSULES BY MOUTH TWICE DAILY. 08/10/15  Yes Manon Hilding Kefalas, PA-C    mirabegron ER (MYRBETRIQ) 50 MG TB24 tablet Take 50 mg by mouth daily.   Yes Historical Provider, MD  albuterol (PROVENTIL HFA;VENTOLIN HFA) 108 (90 Base) MCG/ACT inhaler Inhale 2 puffs into the lungs every 6 (six) hours as needed for wheezing or shortness of breath. 05/26/15   Patrici Ranks, MD  docusate sodium (COLACE) 100 MG capsule Take 1 capsule (100 mg total) by mouth every 12 (twelve) hours. 09/25/14   Dalia Heading, PA-C  lidocaine-prilocaine (EMLA) cream Apply a quarter size amount to port site 1 hour prior to chemo. Do not rub in. Cover with plastic wrap. Patient taking differently: Apply 1 application topically once. Apply a quarter size amount to port site 1 hour prior to chemo. Do not rub in. Cover with plastic wrap. 11/02/14   Patrici Ranks, MD  ondansetron (ZOFRAN ODT) 4 MG disintegrating tablet '4mg'$  ODT q4 hours prn nausea/vomit 03/22/15   Merrily Pew, MD  prochlorperazine (COMPAZINE) 10 MG tablet Take 1 tablet (10 mg total) by mouth every 6 (six) hours as needed for nausea or vomiting. 11/02/14   Patrici Ranks, MD  zolpidem (AMBIEN) 10 MG tablet Take 1 tablet (10 mg total) by mouth at bedtime. 09/29/15 11/03/15  Baird Cancer, PA-C     Vital Signs: BP 121/85 mmHg  Pulse 91  Temp(Src) 98.8 F (37.1 C) (Oral)  Resp 16  Ht '5\' 5"'$  (1.651 m)  Wt 169 lb 12.8 oz (77.021 kg)  BMI 28.26 kg/m2  SpO2 100%  Physical Exam awake, alert. Chest with slightly diminished breath sounds bases. Clean, intact right chest wall Port-A-Cath. Heart with regular rate and rhythm. Abdomen soft, positive bowel sounds,  intact left lower quadrant ostomy/suprapubic catheter draining slightly hazy yellow urine, insertion site okay; no lower extremity edema  Imaging: No results found.  Labs:  CBC:  Recent Labs  09/08/15 0903 09/23/15 0900 11/06/15 1245 11/12/15 0945  WBC 16.8* 9.4 6.8 9.0  HGB 12.8* 12.4* 10.9* 12.3*  HCT 37.8* 38.7* 34.5* 38.0*  PLT 565* 495* 442* 399     COAGS:  Recent Labs  09/23/15 0900 11/12/15 0945  INR 1.15 1.12  APTT 38*  --     BMP:  Recent Labs  08/30/15 0549 09/01/15 2245 09/08/15 0903 11/04/15 1323 11/06/15 1245  NA 138 134* 136  --  132*  K 4.0 3.9 3.2*  --  3.6  CL 102 99* 97*  --  99*  CO2 '30 24 26  '$ --  28  GLUCOSE 112* 130* 142*  --  102*  BUN '7 11 7  '$ --  <5*  CALCIUM 8.4* 8.9 8.8*  --  8.0*  CREATININE 0.75 0.73 0.75 0.70 0.57*  GFRNONAA >60 >60 >60  --  >60  GFRAA >60 >60 >60  --  >60    LIVER FUNCTION TESTS:  Recent Labs  07/21/15 1000 08/18/15 0835 09/08/15 0903 11/06/15 1245  BILITOT 0.5 0.4 0.4 0.5  AST 19 48* 31 14*  ALT 19 49 35 15*  ALKPHOS 104 121 183* 101  PROT 7.0 6.7 7.8 6.6  ALBUMIN 3.5 3.5 2.8* 2.9*    Assessment and Plan: Patient with history of stage IV colorectal cancer with prior ostomy/APR/urethral injury requiring suprapubic catheter placement. 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 Plan today is for suprapubic catheter exchange and CT-guided aspiration/possible drainage of the presacral fluid collection. Details/risks of procedure, including not limited to, internal bleeding, infection, injury to adjacent structures discussed with patient with his understanding and consent.   Electronically Signed: D. Rowe Robert 11/12/2015, 12:39 PM   I spent a total of 20 minutes at the the patient's bedside AND on the patient's hospital floor or unit, greater than 50% of which was counseling/coordinating care for suprapubic catheter exchange and CT-guided aspiration/possible drainage of presacral fluid collection

## 2015-11-12 NOTE — Discharge Instructions (Signed)
Bulb Drain Home Care A bulb drain consists of a thin rubber tube and a soft, round bulb that creates a gentle suction. The rubber tube is placed in the area where you had surgery. A bulb is attached to the end of the tube that is outside the body. The bulb drain removes excess fluid that normally builds up in a surgical wound after surgery. The color and amount of fluid will vary. Immediately after surgery, the fluid is bright red and is a little thicker than water. It may gradually change to a yellow or pink color and become more thin and water-like. When the amount decreases to about 1 or 2 tbsp in 24 hours, your health care provider will usually remove it. DAILY CARE  Keep the bulb flat (compressed) at all times, except while emptying it. The flatness creates suction. You can flatten the bulb by squeezing it firmly in the middle and then closing the cap.  Keep sites where the tube enters the skin dry and covered with a bandage (dressing).  Secure the tube 1-2 in (2.5-5.1 cm) below the insertion sites to keep it from pulling on your stitches. The tube is stitched in place and will not slip out.  Secure the bulb as directed by your health care provider.  For the first 3 days after surgery, there usually is more fluid in the bulb. Empty the bulb whenever it becomes half full because the bulb does not create enough suction if it is too full. The bulb could also overflow. Write down how much fluid you remove each time you empty your drain. Add up the amount removed in 24 hours.  Empty the bulb at the same time every day once the amount of fluid decreases and you only need to empty it once a day. Write down the amounts and the 24-hour totals to give to your health care provider. This helps your health care provider know when the tubes can be removed. EMPTYING THE BULB DRAIN Before emptying the bulb, get a measuring cup, a piece of paper and a pen, and wash your hands.  Gently run your fingers down the  tube (stripping) to empty any drainage from the tubing into the bulb. This may need to be done several times a day to clear the tubing of clots and tissue.  Open the bulb cap to release suction, which causes it to inflate. Do not touch the inside of the cap.  Gently run your fingers down the tube (stripping) to empty any drainage from the tubing into the bulb.  Hold the cap out of the way, and pour fluid into the measuring cup.   Squeeze the bulb to provide suction.  Replace the cap.   Check the tape that holds the tube to your skin. If it is becoming loose, you can remove the loose piece of tape and apply a new one. Then, pin the bulb to your shirt.   Write down the amount of fluid you emptied out. Write down the date and each time you emptied your bulb drain. (If there are 2 bulbs, note the amount of drainage from each bulb and keep the totals separate. Your health care provider will want to know the total amounts for each drain and which tube is draining more.)   Flush the fluid down the toilet and wash your hands.   Call your health care provider once you have less than 2 tbsp of fluid collecting in the bulb drain every 24 hours. If  there is drainage around the tube site, change dressings and keep the area dry. Cleanse around tube with sterile saline and place dry gauze around site. This gauze should be changed when it is soiled. If it stays clean and unsoiled, it should still be changed daily.  SEEK MEDICAL CARE IF:  Your drainage has a bad smell or is cloudy.   You have a fever.   Your drainage is increasing instead of decreasing.   Your tube fell out.   You have redness or swelling around the tube site.   You have drainage from a surgical wound.   Your bulb drain will not stay flat after you empty it.  MAKE SURE YOU:   Understand these instructions.  Will watch your condition.  Will get help right away if you are not doing well or get worse.   This  information is not intended to replace advice given to you by your health care provider. Make sure you discuss any questions you have with your health care provider.   Document Released: 04/15/2000 Document Revised: 05/09/2014 Document Reviewed: 11/05/2014 Elsevier Interactive Patient Education 2016 [place].  Suprapubic Catheter Replacement, Care After Refer to this sheet in the next few weeks. These instructions provide you with information on caring for yourself after changing your catheter. Your caregiver may also give you more specific instructions. Call your caregiver if you have any problems or questions after you change your catheter. HOME CARE INSTRUCTIONS   Take all medicines prescribed by your caregiver. Follow the directions carefully.  Drink 8 glasses of water every day. This produces good urine flow.  Check the skin around your catheter a few times every day. Watch for redness and swelling. Look for any fluids coming out of the opening.  Do not use powder or cream around the catheter opening.  Do not take tub baths or use pools or hot tubs.  Keep all follow-up appointments. SEEK MEDICAL CARE IF:   You leak urine.  Your skin around the catheter becomes red or sore.  Your urine flow slows down.  Your urine gets cloudy or smelly. SEEK IMMEDIATE MEDICAL CARE IF:   You have chills, nausea, or back pain.  You have trouble changing your catheter.  Your catheter comes out.  You have blood in your urine.  You have no urine flow for 1 hour.  You have a fever.   This information is not intended to replace advice given to you by your health care provider. Make sure you discuss any questions you have with your health care provider.   Document Released: 01/04/2011 Document Revised: 05/09/2014 Document Reviewed: 01/04/2011 Elsevier Interactive Patient Education 2016 [employer] Inc[employer]    Moderate Conscious Sedation, Adult, Care After Refer to this sheet in the next  few weeks. These instructions provide you with information on caring for yourself after your procedure. Your health care provider may also give you more specific instructions. Your treatment has been planned according to current medical practices, but problems sometimes occur. Call your health care provider if you have any problems or questions after your procedure. WHAT TO EXPECT AFTER THE PROCEDURE  After your procedure:  You may feel sleepy, clumsy, and have poor balance for several hours.  Vomiting may occur if you eat too soon after the procedure. HOME CARE INSTRUCTIONS  Do not participate in any activities where you could become injured for at least 24 hours. Do not:  Drive.  Swim.  Ride a bicycle.  Operate heavy machinery.  Cook.  Use power tools.  Climb ladders.  Work from a high place.  Do not make important decisions or sign legal documents until you are improved.  If you vomit, drink water, juice, or soup when you can drink without vomiting. Make sure you have little or no nausea before eating solid foods.  Only take over-the-counter or prescription medicines for pain, discomfort, or fever as directed by your health care provider.  Make sure you and your family fully understand everything about the medicines given to you, including what side effects may occur.  You should not drink alcohol, take sleeping pills, or take medicines that cause drowsiness for at least 24 hours.  If you smoke, do not smoke without supervision.  If you are feeling better, you may resume normal activities 24 hours after you were sedated.  Keep all appointments with your health care provider. SEEK MEDICAL CARE IF:  Your skin is pale or bluish in color.  You continue to feel nauseous or vomit.  Your pain is getting worse and is not helped by medicine.  You have bleeding or swelling.  You are still sleepy or feeling clumsy after 24 hours. SEEK IMMEDIATE MEDICAL CARE IF:  You  develop a rash.  You have difficulty breathing.  You develop any type of allergic problem.  You have a fever. MAKE SURE YOU:  Understand these instructions.  Will watch your condition.  Will get help right away if you are not doing well or get worse.   This information is not intended to replace advice given to you by your health care provider. Make sure you discuss any questions you have with your health care provider.   Document Released: 02/06/2013 Document Revised: 05/09/2014 Document Reviewed: 02/06/2013 Elsevier Interactive Patient Education [employer].

## 2015-11-16 LAB — AEROBIC/ANAEROBIC CULTURE W GRAM STAIN (SURGICAL/DEEP WOUND): Special Requests: NORMAL

## 2015-11-16 LAB — AEROBIC/ANAEROBIC CULTURE (SURGICAL/DEEP WOUND): CULTURE: NORMAL

## 2015-11-18 ENCOUNTER — Ambulatory Visit: Payer: Medicaid Other | Admitting: Urology

## 2015-11-23 ENCOUNTER — Encounter (HOSPITAL_COMMUNITY): Payer: Self-pay | Admitting: Student-PharmD

## 2015-11-23 NOTE — Progress Notes (Signed)
-  No show-  ROS

## 2015-11-23 NOTE — Assessment & Plan Note (Deleted)
Stage IV rectal cancer, treated with systemic chemotherapy consisting of FOLFOX + Avastin from 11/05/2014- 05/26/2015 with CT imaging demonstrating progression of disease in pulmonary lesions, therefore a change in therapy to FOLFIRI + Avastin began on 06/23/2015- 07/21/2015 followed by palliative abdominoperineal resection on 08/26/2015 followed by post-operative complications resulting in holding of chemotherapy since, now with CT imaging on July 2017 demonstrating progression of disease.    Oncology history is updated.

## 2015-11-24 ENCOUNTER — Ambulatory Visit (HOSPITAL_COMMUNITY): Payer: Medicaid Other

## 2015-11-24 ENCOUNTER — Ambulatory Visit (HOSPITAL_COMMUNITY): Payer: Medicaid Other | Admitting: Oncology

## 2015-11-25 ENCOUNTER — Other Ambulatory Visit (HOSPITAL_COMMUNITY): Payer: Self-pay | Admitting: Oncology

## 2015-11-25 ENCOUNTER — Other Ambulatory Visit: Payer: Self-pay | Admitting: Urology

## 2015-11-25 ENCOUNTER — Other Ambulatory Visit (HOSPITAL_COMMUNITY): Payer: Self-pay | Admitting: Hematology & Oncology

## 2015-11-25 DIAGNOSIS — N360 Urethral fistula: Secondary | ICD-10-CM

## 2015-11-25 DIAGNOSIS — Z95828 Presence of other vascular implants and grafts: Secondary | ICD-10-CM

## 2015-11-25 DIAGNOSIS — K6289 Other specified diseases of anus and rectum: Secondary | ICD-10-CM

## 2015-11-25 DIAGNOSIS — C20 Malignant neoplasm of rectum: Secondary | ICD-10-CM

## 2015-11-25 DIAGNOSIS — R188 Other ascites: Secondary | ICD-10-CM

## 2015-11-25 DIAGNOSIS — C7800 Secondary malignant neoplasm of unspecified lung: Principal | ICD-10-CM

## 2015-11-26 ENCOUNTER — Other Ambulatory Visit (HOSPITAL_COMMUNITY): Payer: Self-pay | Admitting: Oncology

## 2015-11-26 ENCOUNTER — Encounter (HOSPITAL_COMMUNITY): Payer: Medicaid Other

## 2015-11-26 DIAGNOSIS — R188 Other ascites: Secondary | ICD-10-CM

## 2015-11-26 DIAGNOSIS — K6289 Other specified diseases of anus and rectum: Secondary | ICD-10-CM

## 2015-11-26 DIAGNOSIS — C7800 Secondary malignant neoplasm of unspecified lung: Secondary | ICD-10-CM

## 2015-11-26 DIAGNOSIS — Z95828 Presence of other vascular implants and grafts: Secondary | ICD-10-CM

## 2015-11-26 DIAGNOSIS — C20 Malignant neoplasm of rectum: Secondary | ICD-10-CM

## 2015-11-26 MED ORDER — FENTANYL 75 MCG/HR TD PT72: 150.0000 ug | MEDICATED_PATCH | TRANSDERMAL | 0 refills | Status: DC

## 2015-11-26 MED ORDER — HYDROMORPHONE HCL 4 MG PO TABS: ORAL_TABLET | ORAL | 0 refills | Status: DC

## 2015-11-29 ENCOUNTER — Encounter (HOSPITAL_COMMUNITY): Payer: Self-pay | Admitting: Hematology & Oncology

## 2015-12-03 ENCOUNTER — Encounter (HOSPITAL_COMMUNITY): Payer: Self-pay | Admitting: Hematology & Oncology

## 2015-12-10 ENCOUNTER — Other Ambulatory Visit (HOSPITAL_COMMUNITY): Payer: Medicaid Other

## 2015-12-14 ENCOUNTER — Encounter (HOSPITAL_COMMUNITY): Payer: Self-pay | Admitting: Hematology & Oncology

## 2015-12-15 ENCOUNTER — Telehealth (HOSPITAL_COMMUNITY): Payer: Self-pay | Admitting: *Deleted

## 2015-12-15 NOTE — Telephone Encounter (Signed)
He has appointment 8/31 with Dr. [name]. What do you want me to tell him?

## 2015-12-17 ENCOUNTER — Other Ambulatory Visit: Payer: Self-pay | Admitting: Urology

## 2015-12-17 ENCOUNTER — Encounter (HOSPITAL_COMMUNITY): Payer: Self-pay | Admitting: Interventional Radiology

## 2015-12-17 ENCOUNTER — Ambulatory Visit (HOSPITAL_COMMUNITY)
Admission: RE | Admit: 2015-12-17 | Discharge: 2015-12-17 | Disposition: A | Discharge status: Home / Self Care | Payer: Medicaid Other | Source: Ambulatory Visit | Attending: Urology | Admitting: Urology

## 2015-12-17 DIAGNOSIS — Z436 Encounter for attention to other artificial openings of urinary tract: Secondary | ICD-10-CM | POA: Insufficient documentation

## 2015-12-17 DIAGNOSIS — N360 Urethral fistula: Secondary | ICD-10-CM

## 2015-12-17 HISTORY — PX: IR GENERIC HISTORICAL: IMG1180011

## 2015-12-17 MED ORDER — IOPAMIDOL (ISOVUE-300) INJECTION 61%
5.0000 mL | Freq: Once | INTRAVENOUS | Status: AC | PRN
  Administered 2015-12-17: 5 mL

## 2015-12-22 ENCOUNTER — Encounter (HOSPITAL_COMMUNITY): Payer: Self-pay | Admitting: Hematology & Oncology

## 2015-12-31 ENCOUNTER — Encounter (HOSPITAL_COMMUNITY): Payer: Self-pay | Admitting: Hematology & Oncology

## 2015-12-31 ENCOUNTER — Encounter (HOSPITAL_COMMUNITY): Payer: Medicaid Other | Attending: Hematology & Oncology | Admitting: Hematology & Oncology

## 2015-12-31 DIAGNOSIS — Z9359 Other cystostomy status: Secondary | ICD-10-CM

## 2015-12-31 DIAGNOSIS — G893 Neoplasm related pain (acute) (chronic): Secondary | ICD-10-CM

## 2015-12-31 DIAGNOSIS — C20 Malignant neoplasm of rectum: Secondary | ICD-10-CM | POA: Diagnosis not present

## 2015-12-31 DIAGNOSIS — G4700 Insomnia, unspecified: Secondary | ICD-10-CM | POA: Diagnosis not present

## 2015-12-31 DIAGNOSIS — Z720 Tobacco use: Secondary | ICD-10-CM | POA: Diagnosis not present

## 2015-12-31 DIAGNOSIS — F329 Major depressive disorder, single episode, unspecified: Secondary | ICD-10-CM

## 2015-12-31 DIAGNOSIS — F32A Depression, unspecified: Secondary | ICD-10-CM

## 2015-12-31 DIAGNOSIS — C7800 Secondary malignant neoplasm of unspecified lung: Secondary | ICD-10-CM

## 2015-12-31 DIAGNOSIS — R918 Other nonspecific abnormal finding of lung field: Secondary | ICD-10-CM | POA: Insufficient documentation

## 2015-12-31 DIAGNOSIS — K6289 Other specified diseases of anus and rectum: Secondary | ICD-10-CM

## 2015-12-31 MED ORDER — ZOLPIDEM TARTRATE ER 12.5 MG PO TBCR: 12.5000 mg | EXTENDED_RELEASE_TABLET | Freq: Every evening | ORAL | 0 refills | Status: DC | PRN

## 2015-12-31 MED ORDER — DULOXETINE HCL 60 MG PO CPEP: 60.0000 mg | ORAL_CAPSULE | Freq: Every day | ORAL | 3 refills | Status: DC

## 2015-12-31 MED ORDER — PREGABALIN 75 MG PO CAPS: 150.0000 mg | ORAL_CAPSULE | Freq: Two times a day (BID) | ORAL | 3 refills | Status: DC

## 2015-12-31 MED ORDER — FENTANYL 75 MCG/HR TD PT72: 150.0000 ug | MEDICATED_PATCH | TRANSDERMAL | 0 refills | Status: DC

## 2015-12-31 MED ORDER — AMITRIPTYLINE HCL 50 MG PO TABS: 50.0000 mg | ORAL_TABLET | Freq: Every day | ORAL | 3 refills | Status: DC

## 2015-12-31 NOTE — Progress Notes (Signed)
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       09/25/2014 Imaging    CT abd/pelvis- Indeterminate nodules within the bilateral lung bases with dominant approximately 1.7 cm centrally necrotic nodule within the right lower lobe...      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      10/03/2014 Tumor Marker    CEA NOT ELEVATED      10/09/2014 Imaging    CT Chest- Bilateral pulmonary nodules of varying size are most consistent with pulmonary metastasis. There are approximately 6 lesions in each lung.      10/17/2014 Pathology Results    Rectum, biopsy - INVASIVE ADENOCARCINOMA      10/29/2014 Pathology Results    Diagnosis Lung, needle/core biopsy(ies), right lower lobe - METASTATIC ADENOCARCINOMA      10/29/2014 Pathology Results    KRAS wildtype      11/04/2014 - 12/18/2014 Radiation Therapy    Dr. Lisbeth Renshaw      11/05/2014 - 05/26/2015 Chemotherapy    FOLFOX + Avastin      02/11/2015 Surgery    Colostomy formation by Dr. Arnoldo Morale.      02/26/2015 Imaging    CT CAP- Interval response to therapy with decrease in size of multiple bilateral pulmonary nodules. No new or progressive pulmonary metastasis.  No evidence for metastatic disease within the abdomen or pelvis.       03/22/2015 Imaging    CT RENAL STONE- There is an obstructing 4 mm stone within the distal left ureter resulting in moderate left hydroureteronephrosis.  Re- demonstrated pulmonary nodules within the right lower  lobe and lingula, compatible with known metastasis.      05/20/2015 Progression    CT Scans demonstrate pulmonary progression of disease.      05/20/2015 Imaging    CT CAP- Mild progression of bilateral pulmonary metastases, including a 1.9 cm cavitary nodule in the right lower lobe with increasing solid component.      06/23/2015 - 07/21/2015 Chemotherapy    FOLFIRI + Avastin      08/26/2015 Surgery    Abdominoperineal resection (palliative)      08/26/2015 Pathology Results    microscopic focus of adenocarcinoma of colon, tumor invades submucosa      09/23/2015 Procedure     Temporary Suprapubic catheter placement IR secondary to small knick in urethra during APR, urinary retention      11/04/2015 Imaging    CT CAP- Presacral collection of fluid and air is new from 05/20/2015 and worrisome for an abscess in this patient status post abdominal perineal resection on 08/26/2015.  Worsening pulmonary metastatic disease. Chronic calcific pancreatitis.      11/04/2015 Progression    CT imaging demonstrating progression of pulmonary nodules      11/04/2015 Imaging    CT C/A/P with presacral collection of fluid and air new worrisome for abscess, worsening pulmonary metastatic disease, chronic calcific pancreatitis      11/12/2015 Procedure    Successful CT-guided left  trans gluteal pelvic abscess drain insertion and successful CT-guided 12 French suprapubic catheter exchange by IR       HISTORY OF PRESENTING ILLNESS:  Andrew Kline 50 y.o. male is here because of stage IV rectal cancer.  He has undergone a palliative APR, unfortunately had several complications but is now on the mend.  Patient is accompanied by girlfriend.   He is frustrated about having to wait until Sept. 12 to be seen for follow up with urology.  He still is wearing catheter. He doesn't know if he has infection because he still has penile discharge he describes as brown discharge mixed with urine. He is still  wearing an adult diaper.   He states he is not sure that he can do chemo because of infection in his pelvis and this concerns him. He wants to restart chemotherapy.   He saw Dr. Arnoldo Morale a month ago but Dr. Arnoldo Morale hasn't said anything about whether or not patient is released.  He got a bigger tube (suprapubic catheter) put in at [facility] in March. He is experiencing pain in his abdomen from the catheter and can't move around much because it is uncomfortable. Interventional radiology is changing the catheter. He states that he has an appointment on November 9 to get another one put in. He said the hospital told him it would be over a year before catheter comes out.   Patient says he feels discouraged.   He has difficulty sleeping at night and says the Lorrin Mais is no longer working.   He walks up and down the road as far as he can go everyday. His girlfriend says she has to push him a lot more during the day to  keep him moving.    He smokes a pack in a half day if he is really down but it just depends on his mood. There are days where he won't even go through half a pack.  Weight has been stable since May.   MEDICAL HISTORY:  Past Medical History:  Diagnosis Date  . Anxiety   . Arthritis   . Depression   . Kidney stone 03/22/15   left  . Rectal cancer ([place])   . Rectal pain   . Renal cell cancer ([place])    2012.   [name] Shortness of breath dyspnea     SURGICAL HISTORY: Past Surgical History:  Procedure Laterality Date  . BIOPSY N/A 09/30/2014   Procedure: BIOPSY;  Surgeon: Danie Binder, MD;  Location: [facility] ORS;  Service: Endoscopy;  Laterality: N/A;  Anal Canal  . COLOSTOMY N/A 02/11/2015   Procedure: COLOSTOMY;  Surgeon: Aviva Signs, MD;  Location: [facility] ORS;  Service: General;  Laterality: N/A;  . FLEXIBLE SIGMOIDOSCOPY N/A 09/30/2014   Procedure: FLEXIBLE SIGMOIDOSCOPY;  Surgeon: Danie Binder, MD;  Location: [facility] ORS;  Service: Endoscopy;  Laterality: N/A;  . FLEXIBLE SIGMOIDOSCOPY  N/A 10/17/2014   Procedure: FLEXIBLE SIGMOIDOSCOPY;  Surgeon: Danie Binder, MD;  Location: [facility] ENDO SUITE;  Service: Endoscopy;  Laterality: N/A;  1325  . HYPOSPADIAS CORRECTION    . IR GENERIC HISTORICAL  12/17/2015   IR CATHETER TUBE CHANGE 12/17/2015 [facility]-INTERV RAD  . KNEE SURGERY Left    arthroscopy  . LAPAROSCOPIC PARTIAL NEPHRECTOMY  2012   right side  . LUNG BIOPSY Right   . PERINEAL PROCTECTOMY N/A 08/26/2015   Procedure:  PROCTECTOMY;  Surgeon: Aviva Signs, MD;  Location: [facility] ORS;  Service: General;  Laterality: N/A;  . RECTAL SURGERY  SOCIAL HISTORY: Social History   Social History  . Marital status: Divorced    Spouse name: N/A  . Number of children: 2  . Years of education: N/A   Occupational History  . retail    Social History Main Topics  . Smoking status: Former Smoker    Packs/day: 0.50    Years: 30.00    Types: Cigarettes  . Smokeless tobacco: Never Used     Comment: a little over a pack daily  . Alcohol use 0.0 oz/week     Comment: Occ  . Drug use: No  . Sexual activity: Yes    Birth control/ protection: None   Other Topics Concern  . Not on file   Social History Narrative  . No narrative on file  He is from [place], [place]. Moved here in March. Smoker, 1 ppd. Was 1.5 ppd. ETOH, none. 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 He is divorced. Lives with fiance. 2 children. 63 and 92 yo. No grandchildren.  FAMILY HISTORY: Family History  Problem Relation Age of Onset  . Healthy Mother   . Lung cancer Maternal Grandfather   . Colon cancer Maternal Uncle     age 74  . Diabetes Other    indicated that his mother is alive. He indicated that his father is alive. He indicated that the status of his maternal grandfather is unknown. He indicated that the status of his maternal uncle is unknown. He indicated that the status of his other is unknown.      Mother living, 66 yo. Still working as a Audiological scientist. Father living, 71-70 yo. 1 brother, 1 sister, 1 step sister. Maternal uncle has colon cancer. He is married to a GI nurse. So he has people to go to with questions and is informed on what is happening. History of diabetes in the family. No history of heart disease in the family.  ALLERGIES:  is allergic to oxaliplatin.  MEDICATIONS:  Current Outpatient Prescriptions  Medication Sig Dispense Refill  . albuterol (PROVENTIL HFA;VENTOLIN HFA) 108 (90 Base) MCG/ACT inhaler Inhale 2 puffs into the lungs every 6 (six) hours as needed for wheezing or shortness of breath. 1 Inhaler 2  . amitriptyline (ELAVIL) 50 MG tablet Take 1 tablet (50 mg total) by mouth at bedtime. 30 tablet 3  . docusate sodium (COLACE) 100 MG capsule Take 1 capsule (100 mg total) by mouth every 12 (twelve) hours. 60 capsule 0  . DULoxetine (CYMBALTA) 60 MG capsule Take 1 capsule (60 mg total) by mouth daily. 30 capsule 3  . fentaNYL (DURAGESIC - DOSED MCG/HR) 75 MCG/HR Place 2 patches (150 mcg total) onto the skin every 3 (three) days. 20 patch 0  . HYDROmorphone (DILAUDID) 4 MG tablet Take one tablet by mouth every 4 hours for pain 60 tablet 0  . lidocaine-prilocaine (EMLA) cream Apply a quarter size amount to port site 1 hour prior to chemo. Do not rub in. Cover with plastic wrap. (Patient taking differently: Apply 1 application topically once. Apply a quarter size amount to port site 1 hour prior to chemo. Do not rub in. Cover with plastic wrap.) 30 g 3  . mirabegron ER (MYRBETRIQ) 50 MG TB24 tablet Take 50 mg by mouth daily.    . ondansetron (ZOFRAN ODT) 4 MG disintegrating tablet 44m ODT q4 hours prn nausea/vomit 20 tablet 0  . pregabalin (LYRICA) 75 MG capsule Take  2 capsules (150 mg total) by mouth 2 (two) times daily. 120 capsule 3  . prochlorperazine (COMPAZINE) 10 MG tablet Take 1 tablet (10 mg total) by mouth every 6 (six) hours as needed for nausea or vomiting. 60  tablet 0  . zolpidem (AMBIEN) 10 MG tablet Take 1 tablet (10 mg total) by mouth at bedtime. 30 tablet 2  . zolpidem (AMBIEN CR) 12.5 MG CR tablet Take 1 tablet (12.5 mg total) by mouth at bedtime as needed for sleep. 30 tablet 0   No current facility-administered medications for this visit.    Facility-Administered Medications Ordered in Other Visits  Medication Dose Route Frequency Provider Last Rate Last Dose  . 0.9 %  sodium chloride infusion   Intravenous Continuous Patrici Ranks, MD   Stopped at 08/26/15 1435    Review of Systems  Constitutional: Positive for malaise/fatigue HENT: Negative. Respiratory: Negative. Gastrointestinal:  Negative for blood in stool.  Genitourinary: Positive for groin pain  Psychiatric/Behavioral: Positive for ANXIETY. 14 point ROS was done and is otherwise as detailed above or in HPI   PHYSICAL EXAMINATION:  ECOG PERFORMANCE STATUS: 1 - Symptomatic but completely ambulatory    Physical Exam  Constitutional: He is oriented to person, place, and time and well-developed,  and in no distress. Ambulates with use of cane. Actually appears comfortable today HENT:  Head: Normocephalic and atraumatic.  Nose: Nose normal.  Mouth/Throat: Oropharynx is clear and moist. No oropharyngeal exudate.  Dentures on top.  Eyes: Conjunctivae and EOM are normal. Pupils are equal, round, and reactive to light. Right eye exhibits no discharge. Left eye exhibits no discharge. No scleral icterus.  Neck: Normal range of motion. Neck supple. No tracheal deviation present. No thyromegaly present.  Cardiovascular: Normal rate, regular rhythm and normal heart sounds.  Exam reveals no gallop and no friction rub.   No murmur heard. Pulmonary/Chest: Normal Abdominal: Soft. Bowel sounds are normal. He exhibits no distension and no mass. There is no tenderness. There is no rebound and no guarding. Ostomy is c/d/i  Suprapubic catheter Genitourinary:   Musculoskeletal: Normal  range of motion. He exhibits no edema.  Lymphadenopathy:    He has no cervical adenopathy.  Neurological: He is alert and oriented to person, place, and time. He has normal reflexes. No cranial nerve deficit. Gait normal. Coordination normal.  Skin: Skin is warm and dry. No rash noted.  Psychiatric: Mood, memory, affect and judgment normal.  Nursing note and vitals reviewed.   LABORATORY DATA:  I have reviewed the data as listed Results for Andrew Kline, Andrew Kline (MRN [id number])   Results for Andrew Kline, Andrew Kline (MRN [id number]) as of 12/31/2015 16:55  Ref. Range 11/12/2015 09:45  WBC Latest Ref Range: 4.0 - 10.5 K/uL 9.0  RBC Latest Ref Range: 4.22 - 5.81 MIL/uL 4.61  Hemoglobin Latest Ref Range: 13.0 - 17.0 g/dL 12.3 (L)  HCT Latest Ref Range: 39.0 - 52.0 % 38.0 (L)  MCV Latest Ref Range: 78.0 - 100.0 fL 82.4  MCH Latest Ref Range: 26.0 - 34.0 pg 26.7  MCHC Latest Ref Range: 30.0 - 36.0 g/dL 32.4  RDW Latest Ref Range: 11.5 - 15.5 % 18.3 (H)  Platelets Latest Ref Range: 150 - 400 K/uL 399  Neutrophils Latest Units: % 77  Lymphocytes Latest Units: % 11  Monocytes Relative Latest Units: % 10  Eosinophil Latest Units: % 2  Basophil Latest Units: % 0  NEUT# Latest Ref Range: 1.7 - 7.7 K/uL 6.9  Lymphocyte # Latest Ref Range:  0.7 - 4.0 K/uL 1.0  Monocyte # Latest Ref Range: 0.1 - 1.0 K/uL 0.9  Eosinophils Absolute Latest Ref Range: 0.0 - 0.7 K/uL 0.2  Basophils Absolute Latest Ref Range: 0.0 - 0.1 K/uL 0.0  Prothrombin Time Latest Ref Range: 11.6 - 15.2 seconds 14.2  INR Latest Ref Range: 0.00 - 1.49  1.12     RADIOLOGY: I have personally reviewed the radiological images as listed and agreed with the findings in the report.  Study Result     CLINICAL DATA: Restaging stage IV colorectal carcinoma. Ureter lacerated during surgery, status post repair.  EXAM: CT CHEST, ABDOMEN, AND PELVIS WITH CONTRAST  TECHNIQUE: Multidetector CT imaging of the chest, abdomen and pelvis  was performed following the standard protocol during bolus administration of intravenous contrast.  CONTRAST: 126m ISOVUE-300 IOPAMIDOL (ISOVUE-300) INJECTION 61%  COMPARISON: CT chest abdomen pelvis 05/20/2015.  FINDINGS: CT CHEST FINDINGS  Mediastinum/Lymph Nodes: Right IJ Port-A-Cath terminates in the low SVC. Mediastinal lymph nodes are not enlarged by CT size criteria. No hilar or axillary adenopathy. Coronary artery calcification. Heart size normal. No pericardial effusion.  Lungs/Pleura: There are new and enlarging irregular nodules in the lungs. Index new nodule in the right lower lobe measures 1.5 cm (series 5, image 88). No pleural fluid. Airway is unremarkable.  Musculoskeletal: No worrisome lytic or sclerotic lesions.  CT ABDOMEN PELVIS FINDINGS  Hepatobiliary: Liver and gallbladder are unremarkable. No biliary ductal dilatation.  Pancreas: Calcifications are seen throughout pancreas.  Spleen: Negative.  Adrenals/Urinary Tract: Adrenal glands are unremarkable. Question postoperative changes along the lower pole right kidney. Kidneys are otherwise unremarkable. Ureters are decompressed.  Stomach/Bowel: Stomach, small bowel and majority of the colon are unremarkable. Abdominal perineal resection with a left lower quadrant colostomy and a parastomal hernia containing unobstructed small bowel. A collection of fluid and air is seen in the presacral space, measuring 5.0 x 5.4 cm, new from 05/20/2015. On nephrographic phase imaging, there is no increase in attenuation within the fluid collection.  Vascular/Lymphatic: Atherosclerotic calcification of the arterial vasculature without abdominal aortic aneurysm. No pathologically enlarged lymph nodes.  Reproductive: Prostate is visualized.  Other: Presacral collection of fluid and air is discussed above. Mesenteries and peritoneum are otherwise unremarkable. Small left inguinal hernia contains  fat.  Musculoskeletal: No worrisome lytic or sclerotic lesions.  IMPRESSION: 1. Presacral collection of fluid and air is new from 05/20/2015 and worrisome for an abscess in this patient status post abdominal perineal resection on 08/26/2015. Urinoma is not excluded. These results will be called to the ordering clinician or representative by the Radiologist Assistant, and communication documented in the PACS or zVision Dashboard. 2. Worsening pulmonary metastatic disease. 3. Coronary artery calcification. 4. Chronic calcific pancreatitis. 5. Aortic atherosclerosis.   Electronically Signed  By: MLorin PicketM.D.  On: 11/04/2015 14:35        ASSESSMENT & PLAN:  STAGE IV Rectal vs. Anal carcinoma, adenocarcinoma Pulmonary metastases Good PS [facility] Colostomy for palliation of poor bowel control Lyrica for pain, with marked improvement in QOL and pain scores Contact Dermatitis from Fentanyl patch Weight loss Bronchitis -- resolved FOLFIRI/AVASTIN S/P palliative APR TEMP suprapubic catheter  Pre-sacral fluid collection vs. abscess   He needs restaging. Last scans were several months ago. He is agreeable. Will request copies of last urology and surgical notes.    Plan will be to restart FOLFIRI +/- AVASTIN at some point.  Hopefully  he will still respond. He is KRAS WT, will check on MSI testing as well.   I have refilled lyrica, amitriptyline, fentanyl patch, and cymbalta.   I have switch him to ambien CR 12.5 mg for difficulty sleeping. Ambien no longer works to keep him asleep.   Recommended that patient gets involved to assist with his depression. If that does not work, we can discuss trying a new anti-depressant. I anticipate getting him back on therapy will help. I have also encouraged support groups. He is encouraged to continue with daily activity.    Follow up in a week   Orders Placed This Encounter  Procedures  . CT Chest W Contrast    Standing Status:   Future    Standing Expiration Date:   12/30/2016    Order Specific Question:   If indicated for the ordered procedure, I authorize the administration of contrast media per Radiology protocol    Answer:   Yes    Order Specific Question:   Reason for Exam (SYMPTOM  OR DIAGNOSIS REQUIRED)    Answer:   restaging stage IV rectal carcinoma    Order Specific Question:   Preferred imaging location?    Answer:   [facility]  . CT Abdomen Pelvis W Contrast    Standing Status:   Future    Standing Expiration Date:   12/30/2016    Order Specific Question:   If indicated for the ordered procedure, I authorize the administration of contrast media per Radiology protocol    Answer:   Yes    Order Specific Question:   Reason for Exam (SYMPTOM  OR DIAGNOSIS REQUIRED)    Answer:   restaging stage IV rectal carcinoma    Order Specific Question:   Preferred imaging location?    Answer:   [facility]   This document serves as a record of services personally performed by Ancil Linsey, MD. It was created on her behalf by Elmyra Ricks, a trained medical scribe. The creation of this record is based on the scribe's personal observations and the provider's statements to them. This document has been checked and approved by the attending provider.  I have reviewed the above documentation for accuracy and completeness, and I agree with the above.   This note was electronically signed.   Kelby Fam. [name], MD

## 2015-12-31 NOTE — Patient Instructions (Addendum)
[place] at [facility] Discharge Instructions  RECOMMENDATIONS MADE BY THE CONSULTANT AND ANY TEST RESULTS WILL BE SENT TO YOUR REFERRING PHYSICIAN.  You saw Dr. [name] today. You will be scheduled for CT scans. Follow up at cancer center after scans.  Thank you for choosing [place] at [facility] to provide your oncology and hematology care.  To afford each patient quality time with our provider, please arrive at least 15 minutes before your scheduled appointment time.   Beginning January 23rd 2017 lab work for the [facility] will be done in the  Main lab at [facility] on 1st floor. If you have a lab appointment with the [facility] please come in thru the  Main Entrance and check in at the main information desk  You need to re-schedule your appointment should you arrive 10 or more minutes late.  We strive to give you quality time with our providers, and arriving late affects you and other patients whose appointments are after yours.  Also, if you no show three or more times for appointments you may be dismissed from the clinic at the providers discretion.     Again, thank you for choosing [facility].  Our hope is that these requests will decrease the amount of time that you wait before being seen by our physicians.       _____________________________________________________________  Should you have questions after your visit to [facility], please contact our office at [phone] between the hours of 8:30 a.m. and 4:30 p.m.  Voicemails left after 4:30 p.m. will not be returned until the following business day.  For prescription refill requests, have your pharmacy contact our office.         Resources For Cancer Patients and their Caregivers ? American Cancer Society: Can assist with transportation, wigs, general needs, runs Look Good Feel Better.        681-834-7894 ? Cancer Care: Provides  financial assistance, online support groups, medication/co-pay assistance.  1-800-813-HOPE 380-559-4970) ? [place] Assists [place] cancer patients and their families through emotional , educational and financial support.  725-263-4441 ? [place] DSS Where to apply for food stamps, Medicaid and utility assistance. (503) 144-2801 ? [facility]: Transportation to medical appointments. 9345459523 ? Social Security Administration: May apply for disability if have a Stage IV cancer. 518-057-7207 (989) 171-0852 ? [employer], Disability and Transit Services: Assists with nutrition, care and transit needs. [place] Support Programs: '@10RELATIVEDAYS'$ @ > Cancer Support Group  2nd Tuesday of the month 1pm-2pm, Journey Room  > Creative Journey  3rd Tuesday of the month 1130am-1pm, Journey Room  > Look Good Feel Better  1st Wednesday of the month 10am-12 noon, Journey Room (Call [place] to register [phone])

## 2016-01-12 ENCOUNTER — Ambulatory Visit (HOSPITAL_COMMUNITY)
Admission: RE | Admit: 2016-01-12 | Discharge: 2016-01-12 | Disposition: A | Discharge status: Home / Self Care | Payer: Medicaid Other | Source: Ambulatory Visit | Attending: Hematology & Oncology | Admitting: Hematology & Oncology

## 2016-01-12 ENCOUNTER — Ambulatory Visit (INDEPENDENT_AMBULATORY_CARE_PROVIDER_SITE_OTHER): Payer: Medicaid Other | Admitting: Urology

## 2016-01-12 DIAGNOSIS — K435 Parastomal hernia without obstruction or  gangrene: Secondary | ICD-10-CM | POA: Diagnosis not present

## 2016-01-12 DIAGNOSIS — R918 Other nonspecific abnormal finding of lung field: Secondary | ICD-10-CM | POA: Insufficient documentation

## 2016-01-12 DIAGNOSIS — Z85048 Personal history of other malignant neoplasm of rectum, rectosigmoid junction, and anus: Secondary | ICD-10-CM

## 2016-01-12 DIAGNOSIS — C7800 Secondary malignant neoplasm of unspecified lung: Secondary | ICD-10-CM | POA: Diagnosis not present

## 2016-01-12 DIAGNOSIS — N360 Urethral fistula: Secondary | ICD-10-CM | POA: Diagnosis not present

## 2016-01-12 DIAGNOSIS — N1330 Unspecified hydronephrosis: Secondary | ICD-10-CM | POA: Insufficient documentation

## 2016-01-12 DIAGNOSIS — C20 Malignant neoplasm of rectum: Secondary | ICD-10-CM | POA: Diagnosis not present

## 2016-01-12 DIAGNOSIS — K6289 Other specified diseases of anus and rectum: Secondary | ICD-10-CM | POA: Diagnosis not present

## 2016-01-12 DIAGNOSIS — S3720XA Unspecified injury of bladder, initial encounter: Secondary | ICD-10-CM

## 2016-01-12 DIAGNOSIS — Z933 Colostomy status: Secondary | ICD-10-CM | POA: Insufficient documentation

## 2016-01-12 MED ORDER — IOPAMIDOL (ISOVUE-300) INJECTION 61%
100.0000 mL | Freq: Once | INTRAVENOUS | Status: AC | PRN
  Administered 2016-01-12: 100 mL via INTRAVENOUS

## 2016-01-19 ENCOUNTER — Encounter (HOSPITAL_COMMUNITY): Payer: Self-pay | Admitting: Oncology

## 2016-01-19 ENCOUNTER — Encounter (HOSPITAL_COMMUNITY): Payer: Medicaid Other | Attending: Hematology & Oncology | Admitting: Oncology

## 2016-01-19 VITALS — BP 147/80 | HR 78 | Temp 98.7°F | Resp 16 | Ht 66.0 in | Wt 193.5 lb

## 2016-01-19 DIAGNOSIS — C7800 Secondary malignant neoplasm of unspecified lung: Secondary | ICD-10-CM | POA: Diagnosis not present

## 2016-01-19 DIAGNOSIS — C20 Malignant neoplasm of rectum: Secondary | ICD-10-CM | POA: Insufficient documentation

## 2016-01-19 DIAGNOSIS — R058 Other specified cough: Secondary | ICD-10-CM

## 2016-01-19 DIAGNOSIS — R05 Cough: Secondary | ICD-10-CM

## 2016-01-19 DIAGNOSIS — R918 Other nonspecific abnormal finding of lung field: Secondary | ICD-10-CM | POA: Insufficient documentation

## 2016-01-19 MED ORDER — PREDNISONE 10 MG (21) PO TBPK: 10.0000 mg | ORAL_TABLET | Freq: Every day | ORAL | 0 refills | Status: DC

## 2016-01-19 NOTE — Progress Notes (Addendum)
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 male returns for followup of STAGE IV Rectal carcinoma, adenocarcinoma with pulmonary metastases.  History of partial nephrectomy in 2012 at [facility] in [place].  Treated with FOLFOX/AVASTIN and XRT for palliation.  Change in therapy to FOLFIRI/Avastin due to progression of pulmonary metastases.  He is S/P colostomy for palliation of poor bowel control and more recently a palliative APR by Dr. Arnoldo Morale.    Rectal cancer metastasized to lung ([place])   09/20/2014 Imaging    CT pelvis- Questionable asymmetric wall thickening or mass along the right wall of the anus/ rectum.       09/25/2014 Imaging    CT abd/pelvis- Indeterminate nodules within the bilateral lung bases with dominant approximately 1.7 cm centrally necrotic nodule within the right lower lobe...      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      10/03/2014 Tumor Marker    CEA NOT ELEVATED      10/09/2014 Imaging    CT Chest- Bilateral pulmonary nodules of varying size are most consistent with pulmonary metastasis. There are approximately 6 lesions in each lung.      10/17/2014 Pathology Results    Rectum, biopsy - INVASIVE ADENOCARCINOMA      10/29/2014 Pathology Results    Diagnosis Lung, needle/core biopsy(ies), right lower lobe - METASTATIC ADENOCARCINOMA      10/29/2014 Pathology Results    KRAS wildtype      11/04/2014 - 12/18/2014 Radiation Therapy      Dr. Lisbeth Renshaw      11/05/2014 - 05/26/2015 Chemotherapy    FOLFOX + Avastin      02/11/2015 Surgery    Colostomy formation by Dr. Arnoldo Morale.      02/26/2015 Imaging    CT CAP- Interval response to therapy with decrease in size of multiple bilateral pulmonary nodules. No new or progressive pulmonary metastasis.  No evidence for metastatic disease within the abdomen or pelvis.       03/22/2015 Imaging    CT RENAL STONE- There is an obstructing 4 mm stone within the distal left ureter resulting in moderate left hydroureteronephrosis.  Re- demonstrated pulmonary nodules within the right lower lobe and lingula, compatible with known metastasis.      05/20/2015 Progression    CT Scans demonstrate pulmonary progression of disease.      05/20/2015 Imaging    CT CAP- Mild progression of bilateral pulmonary metastases, including a 1.9 cm cavitary nodule in the right lower lobe with increasing solid component.      06/23/2015 - 07/21/2015 Chemotherapy    FOLFIRI + Avastin      08/26/2015 Surgery    Abdominoperineal resection (palliative)      08/26/2015 Pathology Results    microscopic focus of adenocarcinoma of colon, tumor invades submucosa      09/23/2015 Procedure     Temporary Suprapubic catheter placement IR secondary to small knick in urethra during APR, urinary retention      11/04/2015 Imaging  CT CAP- Presacral collection of fluid and air is new from 05/20/2015 and worrisome for an abscess in this patient status post abdominal perineal resection on 08/26/2015.  Worsening pulmonary metastatic disease. Chronic calcific pancreatitis.      11/04/2015 Progression    CT imaging demonstrating progression of pulmonary nodules      11/04/2015 Imaging    CT C/A/P with presacral collection of fluid and air new worrisome for abscess, worsening pulmonary metastatic disease, chronic calcific pancreatitis      11/12/2015 Procedure    Successful CT-guided left trans gluteal pelvic abscess drain  insertion and successful CT-guided 12 French suprapubic catheter exchange by IR      01/13/2016 Imaging    CT CAP- 1. Enlarging bilateral pulmonary nodules compatible with progressive metastatic disease. Some of these nodules are cavitary. 2. Right hydronephrosis and hydroureter due to a distal stricture or regional wall thickening in the right distal ureter starting just past the iliac vessel cross over. This results in delayed excretion on the right, and considerable right hydronephrosis. In the area of involvement, the ureter does hurt the margin of the presacral soft tissue density. 3. Increase in presacral soft tissue density noted. Reduced size of the internal fluid collection within this soft tissue density, currently 27 cc. I suspect that this collection may be extending anteriorly along its lower margin and possibly connecting to the prostatic urethra based on how for forwarded extends and based on the history of a brown discharge from the penis. The prior gas in this collection is no longer present but residual infection is not entirely excluded. The presacral soft tissue density does extend to the posterior bladder margin but I do not see any posterior leak of contrast from the urinary bladder or ureters on the delayed postvoid images. A suprapubic catheter is in place. 4. Left colostomy with peristomal hernia containing several loops of small bowel without complicating feature. Mild prominence of stool in the remaining colon.      01/13/2016 Progression    CT scans demonstrate progression of pulmonary nodules.      He notes a dry cough that is non-productive.  He denies any fevers. His wife interjects that she thinks he has an infection because "I can smell it on his skin."  She notes that he can shower and the "infection smell" return within 30 minutes.  He notes that the cough wakes him at night and is worse.  Review of Systems  Constitutional: Negative.  Negative for  chills, fever and weight loss.  HENT: Negative.   Eyes: Negative.  Negative for blurred vision.  Respiratory: Positive for cough. Negative for hemoptysis, sputum production and shortness of breath.   Cardiovascular: Negative.  Negative for chest pain.  Gastrointestinal: Negative.  Negative for nausea and vomiting.  Genitourinary: Negative.   Musculoskeletal: Negative.   Skin: Negative.  Negative for itching and rash.  Neurological: Negative.  Negative for weakness and headaches.  Endo/Heme/Allergies: Negative.   Psychiatric/Behavioral: Negative.     Past Medical History:  Diagnosis Date  . Anxiety   . Arthritis   . Depression   . Kidney stone 03/22/15   left  . Rectal cancer ([place])   . Rectal pain   . Renal cell cancer ([place])    2012.   [name] Shortness of breath dyspnea     Past Surgical History:  Procedure Laterality Date  . BIOPSY N/A 09/30/2014   Procedure: BIOPSY;  Surgeon: Danie Binder, MD;  Location: [facility];  Service:  Endoscopy;  Laterality: N/A;  Anal Canal  . COLOSTOMY N/A 02/11/2015   Procedure: COLOSTOMY;  Surgeon: Aviva Signs, MD;  Location: [facility];  Service: General;  Laterality: N/A;  . FLEXIBLE SIGMOIDOSCOPY N/A 09/30/2014   Procedure: FLEXIBLE SIGMOIDOSCOPY;  Surgeon: Danie Binder, MD;  Location: [facility];  Service: Endoscopy;  Laterality: N/A;  . FLEXIBLE SIGMOIDOSCOPY N/A 10/17/2014   Procedure: FLEXIBLE SIGMOIDOSCOPY;  Surgeon: Danie Binder, MD;  Location: [facility] END[facility] SUITE;  Service: Endoscopy;  Laterality: N/A;  1325  . HYPOSPADIAS CORRECTION    . IR GENERIC HISTORICAL  12/17/2015   IR CATHETER TUBE CHANGE 12/17/2015 [facility]-INTERV RAD  . KNEE SURGERY Left    arthroscopy  . LAPAROSCOPIC PARTIAL NEPHRECTOMY  2012   right side  . LUNG BIOPSY Right   . PERINEAL PROCTECTOMY N/A 08/26/2015   Procedure:  PROCTECTOMY;  Surgeon: Aviva Signs, MD;  Location: [facility];  Service: General;  Laterality: N/A;  . RECTAL SURGERY      Family History  Problem Relation Age of Onset   . Healthy Mother   . Lung cancer Maternal Grandfather   . Colon cancer Maternal Uncle     age 1  . Diabetes Other     Social History   Social History  . Marital status: Divorced    Spouse name: N/A  . Number of children: 2  . Years of education: N/A   Occupational History  . retail    Social History Main Topics  . Smoking status: Former Smoker    Packs/day: 0.50    Years: 30.00    Types: Cigarettes  . Smokeless tobacco: Never Used     Comment: a little over a pack daily  . Alcohol use 0.0 oz/week     Comment: Occ  . Drug use: No  . Sexual activity: Yes    Birth control/ protection: None   Other Topics Concern  . None   Social History Narrative  . None     PHYSICAL EXAMINATION  ECOG PERFORMANCE STATUS: 1 - Symptomatic but completely ambulatory  Vitals:   01/19/16 0942  BP: (!) 147/80  Pulse: 78  Resp: 16  Temp: 98.7 F (37.1 C)    GENERAL:alert, no distress, well nourished, well developed, comfortable, cooperative, smiling and accompanied by his wife. SKIN: skin color, texture, turgor are normal, no rashes or significant lesions HEAD: Normocephalic, No masses, lesions, tenderness or abnormalities EYES: normal, EOMI, Conjunctiva are pink and non-injected EARS: External ears normal OROPHARYNX:lips, buccal mucosa, and tongue normal and mucous membranes are moist  NECK: supple, trachea midline LYMPH:  no palpable lymphadenopathy BREAST:not examined LUNGS: clear to auscultation and percussion HEART: regular rate & rhythm, no murmurs and no gallops ABDOMEN:abdomen soft, obese and normal bowel sounds BACK: Back symmetric, no curvature. EXTREMITIES:less then 2 second capillary refill, no joint deformities, effusion, or inflammation, no skin discoloration, no cyanosis, urine catheter bag on leg with clear yellow urine. NEURO: alert & oriented x 3 with fluent speech, no focal motor/sensory deficits, gait normal   LABORATORY DATA: CBC    Component Value  Date/Time   WBC 9.0 11/12/2015 0945   RBC 4.61 11/12/2015 0945   HGB 12.3 (L) 11/12/2015 0945   HCT 38.0 (L) 11/12/2015 0945   PLT 399 11/12/2015 0945   MCV 82.4 11/12/2015 0945   MCH 26.7 11/12/2015 0945   MCHC 32.4 11/12/2015 0945   RDW 18.3 (H) 11/12/2015 0945   LYMPHSABS 1.0 11/12/2015 0945   MONOABS 0.9 11/12/2015 0945  EOSABS 0.2 11/12/2015 0945   BASOSABS 0.0 11/12/2015 0945      Chemistry      Component Value Date/Time   NA 132 (L) 11/06/2015 1245   K 3.6 11/06/2015 1245   CL 99 (L) 11/06/2015 1245   CO2 28 11/06/2015 1245   BUN <5 (L) 11/06/2015 1245   CREATININE 0.57 (L) 11/06/2015 1245      Component Value Date/Time   CALCIUM 8.0 (L) 11/06/2015 1245   ALKPHOS 101 11/06/2015 1245   AST 14 (L) 11/06/2015 1245   ALT 15 (L) 11/06/2015 1245   BILITOT 0.5 11/06/2015 1245        PENDING LABS:   RADIOGRAPHIC STUDIES:  Ct Chest W Contrast  Result Date: 01/13/2016 CLINICAL DATA:  Rectal cancer diagnosed may 2016, rectal surgery (AP resection) in April 2017. Ureteral injury. Metastatic to lung. Discomfort and discharge. EXAM: CT CHEST, ABDOMEN, AND PELVIS WITH CONTRAST TECHNIQUE: Multidetector CT imaging of the chest, abdomen and pelvis was performed following the standard protocol during bolus administration of intravenous contrast. CONTRAST:  182m ISOVUE-300 IOPAMIDOL (ISOVUE-300) INJECTION 61% COMPARISON:  Multiple exams, including 11/04/2015 FINDINGS: CT CHEST FINDINGS Cardiovascular: Port-A-Cath tip:  Lower SVC. Mediastinum/Nodes: AP window lymph node 1.2 cm in short axis diameter, image 30/17, previously 0.9 cm. Lungs/Pleura: Scattered enlarging pulmonary nodules in the lungs, some of which are cavitary. An index thick-walled partially cavitary pulmonary nodule measures 2.3 by 2.0 cm on image 71/18, previously 2.0 by 1.6 cm. No new nodules identified although all of the pulmonary nodules appear enlarged. Musculoskeletal: Thoracic spondylosis. CT ABDOMEN PELVIS  FINDINGS Hepatobiliary: No liver metastatic lesion identified. Gallbladder unremarkable. Pancreas: Speckled calcifications throughout the pancreatic parenchyma compatible chronic calcific pancreatitis. Spleen: Unremarkable Adrenals/Urinary Tract: Adrenal glands normal. Right hydronephrosis and hydroureter extending down to just past the iliac vessel cross over where the ureter is partially encased by presacral soft tissue density and roots to normal caliber. Delayed excretion from the right kidney. Scarring along the right kidney lower pole likely related to partial nephrectomy based on morphology. Partially duplicated left collecting system. No left hydronephrosis or hydroureter. A suprapubic catheter in the urinary bladder. On the 10 minutes delayed images, there is relatively little contrast in the urinary bladder. On the 25 minutes postvoid images there is a greater amount of contrast in the urinary bladder. On the images labeled post void, which presumably is after opening of the clamp on the suprapubic catheter, bladder volume changes from initial measurement of 390 cm^3 to subsequent measurement of 110 cm^3. I do not see a definite leak in the urinary bladder. There does appear to be tethering, narrowing, or stricture of the distal right ureter just after crossing the iliac vessels. No leak of contrast from the ureters or urinary bladder. Stomach/Bowel: Left colostomy. Peristomal herniation of several loops of small bowel without strangulation or obstruction. Mild prominence of stool in the remaining colon. Appendix normal. AP resection noted. Presacral soft tissue density, with an internal 3.9 by 2.9 by 4.6 cm (volume = 27 cm^3) area of fluid density centrally in the presacral soft tissue density. This no longer demonstrates gas density internally and is smaller than on the pre drainage CT. Based on image 102 of series 11, the possibility of this fluid collection communicating anteriorly to the vicinity of  the prostatic urethra is raised ; the hypodensity of does appear to extend this far forward as several punctate calcifications that are in the central prostate gland. Reviewing the patient' s recent progress notes, the  patient does have a brown penile discharge, which would further support the possibility of this connection extending all the way to the prostatic urethra. Vascular/Lymphatic: Aortoiliac atherosclerotic vascular disease. No pathologic adenopathy identified. Reproductive: As noted above, the presacral fluid collection may be communicating with the prostate gland/prostatic urethra. The presacral fluid collection is considerably smaller than on the pre drainage CT of 11/04/2015 and no longer contains gas. The rind of soft tissue density around this fluid collection appears thicker than it did on 11/04/2015 although this still might be ascribed to radiation therapy and inflammatory related findings, but merits careful observation. Tissue planes around the prostate gland are considerably indistinct. Other: No supplemental non-categorized findings. Musculoskeletal: Degenerative disc disease at L5-S1. IMPRESSION: 1. Enlarging bilateral pulmonary nodules compatible with progressive metastatic disease. Some of these nodules are cavitary. 2. Right hydronephrosis and hydroureter due to a distal stricture or regional wall thickening in the right distal ureter starting just past the iliac vessel cross over. This results in delayed excretion on the right, and considerable right hydronephrosis. In the area of involvement, the ureter does hurt the margin of the presacral soft tissue density. 3. Increase in presacral soft tissue density noted. Reduced size of the internal fluid collection within this soft tissue density, currently 27 cc. I suspect that this collection may be extending anteriorly along its lower margin and possibly connecting to the prostatic urethra based on how for forwarded extends and based on the  history of a brown discharge from the penis. The prior gas in this collection is no longer present but residual infection is not entirely excluded. The presacral soft tissue density does extend to the posterior bladder margin but I do not see any posterior leak of contrast from the urinary bladder or ureters on the delayed postvoid images. A suprapubic catheter is in place. 4. Left colostomy with peristomal hernia containing several loops of small bowel without complicating feature. Mild prominence of stool in the remaining colon. Electronically Signed   By: Van Clines M.D.   On: 01/13/2016 08:32   Ct Abdomen Pelvis W Contrast  Result Date: 01/13/2016 CLINICAL DATA:  Rectal cancer diagnosed may 2016, rectal surgery (AP resection) in April 2017. Ureteral injury. Metastatic to lung. Discomfort and discharge. EXAM: CT CHEST, ABDOMEN, AND PELVIS WITH CONTRAST TECHNIQUE: Multidetector CT imaging of the chest, abdomen and pelvis was performed following the standard protocol during bolus administration of intravenous contrast. CONTRAST:  171m ISOVUE-300 IOPAMIDOL (ISOVUE-300) INJECTION 61% COMPARISON:  Multiple exams, including 11/04/2015 FINDINGS: CT CHEST FINDINGS Cardiovascular: Port-A-Cath tip:  Lower SVC. Mediastinum/Nodes: AP window lymph node 1.2 cm in short axis diameter, image 30/17, previously 0.9 cm. Lungs/Pleura: Scattered enlarging pulmonary nodules in the lungs, some of which are cavitary. An index thick-walled partially cavitary pulmonary nodule measures 2.3 by 2.0 cm on image 71/18, previously 2.0 by 1.6 cm. No new nodules identified although all of the pulmonary nodules appear enlarged. Musculoskeletal: Thoracic spondylosis. CT ABDOMEN PELVIS FINDINGS Hepatobiliary: No liver metastatic lesion identified. Gallbladder unremarkable. Pancreas: Speckled calcifications throughout the pancreatic parenchyma compatible chronic calcific pancreatitis. Spleen: Unremarkable Adrenals/Urinary Tract: Adrenal  glands normal. Right hydronephrosis and hydroureter extending down to just past the iliac vessel cross over where the ureter is partially encased by presacral soft tissue density and roots to normal caliber. Delayed excretion from the right kidney. Scarring along the right kidney lower pole likely related to partial nephrectomy based on morphology. Partially duplicated left collecting system. No left hydronephrosis or hydroureter. A suprapubic catheter in the  urinary bladder. On the 10 minutes delayed images, there is relatively little contrast in the urinary bladder. On the 25 minutes postvoid images there is a greater amount of contrast in the urinary bladder. On the images labeled post void, which presumably is after opening of the clamp on the suprapubic catheter, bladder volume changes from initial measurement of 390 cm^3 to subsequent measurement of 110 cm^3. I do not see a definite leak in the urinary bladder. There does appear to be tethering, narrowing, or stricture of the distal right ureter just after crossing the iliac vessels. No leak of contrast from the ureters or urinary bladder. Stomach/Bowel: Left colostomy. Peristomal herniation of several loops of small bowel without strangulation or obstruction. Mild prominence of stool in the remaining colon. Appendix normal. AP resection noted. Presacral soft tissue density, with an internal 3.9 by 2.9 by 4.6 cm (volume = 27 cm^3) area of fluid density centrally in the presacral soft tissue density. This no longer demonstrates gas density internally and is smaller than on the pre drainage CT. Based on image 102 of series 11, the possibility of this fluid collection communicating anteriorly to the vicinity of the prostatic urethra is raised ; the hypodensity of does appear to extend this far forward as several punctate calcifications that are in the central prostate gland. Reviewing the patient' s recent progress notes, the patient does have a brown penile  discharge, which would further support the possibility of this connection extending all the way to the prostatic urethra. Vascular/Lymphatic: Aortoiliac atherosclerotic vascular disease. No pathologic adenopathy identified. Reproductive: As noted above, the presacral fluid collection may be communicating with the prostate gland/prostatic urethra. The presacral fluid collection is considerably smaller than on the pre drainage CT of 11/04/2015 and no longer contains gas. The rind of soft tissue density around this fluid collection appears thicker than it did on 11/04/2015 although this still might be ascribed to radiation therapy and inflammatory related findings, but merits careful observation. Tissue planes around the prostate gland are considerably indistinct. Other: No supplemental non-categorized findings. Musculoskeletal: Degenerative disc disease at L5-S1. IMPRESSION: 1. Enlarging bilateral pulmonary nodules compatible with progressive metastatic disease. Some of these nodules are cavitary. 2. Right hydronephrosis and hydroureter due to a distal stricture or regional wall thickening in the right distal ureter starting just past the iliac vessel cross over. This results in delayed excretion on the right, and considerable right hydronephrosis. In the area of involvement, the ureter does hurt the margin of the presacral soft tissue density. 3. Increase in presacral soft tissue density noted. Reduced size of the internal fluid collection within this soft tissue density, currently 27 cc. I suspect that this collection may be extending anteriorly along its lower margin and possibly connecting to the prostatic urethra based on how for forwarded extends and based on the history of a brown discharge from the penis. The prior gas in this collection is no longer present but residual infection is not entirely excluded. The presacral soft tissue density does extend to the posterior bladder margin but I do not see any  posterior leak of contrast from the urinary bladder or ureters on the delayed postvoid images. A suprapubic catheter is in place. 4. Left colostomy with peristomal hernia containing several loops of small bowel without complicating feature. Mild prominence of stool in the remaining colon. Electronically Signed   By: Van Clines M.D.   On: 01/13/2016 08:32     PATHOLOGY:    ASSESSMENT AND PLAN:  Rectal cancer  metastasized to lung [facility]) STAGE IV Rectal carcinoma, adenocarcinoma with pulmonary metastases.  History of partial nephrectomy in 2012 at [facility] in [place].  Treated with FOLFOX/AVASTIN and XRT for palliation.  Change in therapy to FOLFIRI/Avastin due to progression of pulmonary metastases.  He is S/P colostomy for palliation of poor bowel control and more recently a palliative APR by Dr. Arnoldo Morale.  Oncology history updated.  I personally reviewed and went over radiographic studies with the patient.  The results are noted within this dictation.  CT scans demonstrates progression of disease of pulmonary metastases.  I will contact Urology regarding his abnormality on CT imaging.  I would like to confirm with Urology that they agree with planned therapy as outlined below.  I will update this dictation accordingly.  He reports brown, malodorous urine.  Exam today demonstrates clear yellow urine in catheter bag.  We will plan on restarting FOLFIRI + Avastin and hope that he still responds to this therapy.  I will call pathology and request MSI testing.  For his dry, non-productive cough, I have prescribed a Prednisone Pak.  No antibiotic at this time without any infectious findings.  Return in 1-2 weeks to start FOLFIRI/Avastin.  Pre-treatment labs are ordered: CBC diff, CMET, CEA, UA.  Return in 2-3 weeks for follow-up.  Addendum: I spoke with pathology today, Kim.  She will work on MSI by PCR testing.  I also was able to discuss the patient's case with  Dr. Diona Fanti.  He agrees and clears the patient for chemotherapy from a urologic standpoint.   ORDERS PLACED FOR THIS ENCOUNTER: Orders Placed This Encounter  Procedures  . CBC with Differential  . Comprehensive metabolic panel  . CEA  . Urinalysis, dipstick only    MEDICATIONS PRESCRIBED THIS ENCOUNTER: Meds ordered this encounter  Medications  . predniSONE (STERAPRED UNI-PAK 21 TAB) 10 MG (21) TBPK tablet    Sig: Take 1 tablet (10 mg total) by mouth daily. Take 60 mg PO day 1, 50 mg day 2, 40 mg day 3, 30 mg day 4, 20 mg day 5, 10 mg day 6, STOP    Dispense:  21 tablet    Refill:  0    Order Specific Question:   Supervising Provider    Answer:   Patrici Ranks [id number]    THERAPY PLAN:  Plan to restart systemic chemotherapy with FOLFIRI+Avastin next week.  All questions were answered. The patient knows to call the clinic with any problems, questions or concerns. We can certainly see the patient much sooner if necessary.  Patient and plan discussed with Dr. Ancil Linsey and she is in agreement with the aforementioned.   This note is electronically signed by: Robynn Pane, PA-C 01/20/2016 9:08 AM

## 2016-01-19 NOTE — Assessment & Plan Note (Addendum)
STAGE IV Rectal carcinoma, adenocarcinoma with pulmonary metastases.  History of partial nephrectomy in 2012 at [facility] in [place].  Treated with FOLFOX/AVASTIN and XRT for palliation.  Change in therapy to FOLFIRI/Avastin due to progression of pulmonary metastases.  He is S/P colostomy for palliation of poor bowel control and more recently a palliative APR by Dr. Arnoldo Morale.  Oncology history updated.  I personally reviewed and went over radiographic studies with the patient.  The results are noted within this dictation.  CT scans demonstrates progression of disease of pulmonary metastases.  I will contact Urology regarding his abnormality on CT imaging.  I would like to confirm with Urology that they agree with planned therapy as outlined below.  I will update this dictation accordingly.  He reports brown, malodorous urine.  Exam today demonstrates clear yellow urine in catheter bag.  We will plan on restarting FOLFIRI + Avastin and hope that he still responds to this therapy.  I will call pathology and request MSI testing.  For his dry, non-productive cough, I have prescribed a Prednisone Pak.  No antibiotic at this time without any infectious findings.  Return in 1-2 weeks to start FOLFIRI/Avastin.  Pre-treatment labs are ordered: CBC diff, CMET, CEA, UA.  Return in 2-3 weeks for follow-up.  Addendum: I spoke with pathology today, Kim.  She will work on MSI by PCR testing.  I also was able to discuss the patient's case with Dr. Diona Fanti.  He agrees and clears the patient for chemotherapy from a urologic standpoint.

## 2016-01-19 NOTE — Patient Instructions (Signed)
[place] at [facility] Discharge Instructions  RECOMMENDATIONS MADE BY THE CONSULTANT AND ANY TEST RESULTS WILL BE SENT TO YOUR REFERRING PHYSICIAN.  You were seen by Kirby Crigler PA-C today. You were given an Rx for prednisone. Tom will call Dr. Diona Fanti. Return next week for Chemo treatment and in 2-3 weeks for follow up.   Thank you for choosing [place] at [facility] to provide your oncology and hematology care.  To afford each patient quality time with our provider, please arrive at least 15 minutes before your scheduled appointment time.   Beginning January 23rd 2017 lab work for the [facility] will be done in the  Main lab at [facility] on 1st floor. If you have a lab appointment with the [facility] please come in thru the  Main Entrance and check in at the main information desk  You need to re-schedule your appointment should you arrive 10 or more minutes late.  We strive to give you quality time with our providers, and arriving late affects you and other patients whose appointments are after yours.  Also, if you no show three or more times for appointments you may be dismissed from the clinic at the providers discretion.     Again, thank you for choosing [facility].  Our hope is that these requests will decrease the amount of time that you wait before being seen by our physicians.       _____________________________________________________________  Should you have questions after your visit to [facility], please contact our office at [phone] between the hours of 8:30 a.m. and 4:30 p.m.  Voicemails left after 4:30 p.m. will not be returned until the following business day.  For prescription refill requests, have your pharmacy contact our office.         Resources For Cancer Patients and their Caregivers ? American Cancer Society: Can assist with transportation, wigs, general needs, runs  Look Good Feel Better.        929-828-2528 ? Cancer Care: Provides financial assistance, online support groups, medication/co-pay assistance.  1-800-813-HOPE 507-037-6740) ? [place] Assists [place] cancer patients and their families through emotional , educational and financial support.  774-658-6064 ? [place] DSS Where to apply for food stamps, Medicaid and utility assistance. 406-354-8173 ? [facility]: Transportation to medical appointments. (856) 504-5294 ? Social Security Administration: May apply for disability if have a Stage IV cancer. 639-413-8637 304-887-3194 ? [employer], Disability and Transit Services: Assists with nutrition, care and transit needs. [place] Support Programs: '@10RELATIVEDAYS'$ @ > Cancer Support Group  2nd Tuesday of the month 1pm-2pm, Journey Room  > Creative Journey  3rd Tuesday of the month 1130am-1pm, Journey Room  > Look Good Feel Better  1st Wednesday of the month 10am-12 noon, Journey Room (Call [place] to register [phone])

## 2016-01-22 ENCOUNTER — Encounter (HOSPITAL_COMMUNITY): Payer: Self-pay | Admitting: Hematology & Oncology

## 2016-01-25 ENCOUNTER — Encounter (HOSPITAL_COMMUNITY): Payer: Self-pay | Admitting: Hematology & Oncology

## 2016-01-25 ENCOUNTER — Emergency Department (HOSPITAL_COMMUNITY): Payer: Medicaid Other

## 2016-01-25 ENCOUNTER — Encounter (HOSPITAL_COMMUNITY): Payer: Self-pay | Admitting: Emergency Medicine

## 2016-01-25 ENCOUNTER — Inpatient Hospital Stay (HOSPITAL_COMMUNITY)
Admission: EM | Admit: 2016-01-25 | Discharge: 2016-01-31 | DRG: 690 | Disposition: A | Discharge status: Home / Self Care | Payer: Medicaid Other | Attending: Internal Medicine | Admitting: Internal Medicine

## 2016-01-25 DIAGNOSIS — A419 Sepsis, unspecified organism: Secondary | ICD-10-CM

## 2016-01-25 DIAGNOSIS — Z85038 Personal history of other malignant neoplasm of large intestine: Secondary | ICD-10-CM

## 2016-01-25 DIAGNOSIS — N390 Urinary tract infection, site not specified: Secondary | ICD-10-CM | POA: Diagnosis not present

## 2016-01-25 DIAGNOSIS — Z79899 Other long term (current) drug therapy: Secondary | ICD-10-CM

## 2016-01-25 DIAGNOSIS — Z87891 Personal history of nicotine dependence: Secondary | ICD-10-CM

## 2016-01-25 DIAGNOSIS — N1330 Unspecified hydronephrosis: Secondary | ICD-10-CM

## 2016-01-25 DIAGNOSIS — Z79891 Long term (current) use of opiate analgesic: Secondary | ICD-10-CM

## 2016-01-25 DIAGNOSIS — C20 Malignant neoplasm of rectum: Secondary | ICD-10-CM | POA: Diagnosis present

## 2016-01-25 DIAGNOSIS — M1990 Unspecified osteoarthritis, unspecified site: Secondary | ICD-10-CM | POA: Diagnosis present

## 2016-01-25 DIAGNOSIS — Z933 Colostomy status: Secondary | ICD-10-CM

## 2016-01-25 DIAGNOSIS — G4700 Insomnia, unspecified: Secondary | ICD-10-CM | POA: Diagnosis present

## 2016-01-25 DIAGNOSIS — F419 Anxiety disorder, unspecified: Secondary | ICD-10-CM | POA: Diagnosis present

## 2016-01-25 DIAGNOSIS — F329 Major depressive disorder, single episode, unspecified: Secondary | ICD-10-CM | POA: Diagnosis present

## 2016-01-25 DIAGNOSIS — Z85048 Personal history of other malignant neoplasm of rectum, rectosigmoid junction, and anus: Secondary | ICD-10-CM

## 2016-01-25 DIAGNOSIS — D72829 Elevated white blood cell count, unspecified: Secondary | ICD-10-CM

## 2016-01-25 DIAGNOSIS — Z905 Acquired absence of kidney: Secondary | ICD-10-CM

## 2016-01-25 DIAGNOSIS — N360 Urethral fistula: Secondary | ICD-10-CM

## 2016-01-25 LAB — URINALYSIS, ROUTINE W REFLEX MICROSCOPIC
Bilirubin Urine: NEGATIVE
GLUCOSE, UA: NEGATIVE mg/dL
KETONES UR: NEGATIVE mg/dL
Nitrite: POSITIVE — AB
PROTEIN: 30 mg/dL — AB
Specific Gravity, Urine: <1.005 — ABNORMAL LOW (ref 1.005–1.030)
pH: 6 (ref 5.0–8.0)

## 2016-01-25 LAB — HEPATIC FUNCTION PANEL
ALBUMIN: 3.6 g/dL (ref 3.5–5.0)
ALK PHOS: 117 U/L (ref 38–126)
ALT: 19 U/L (ref 17–63)
AST: 19 U/L (ref 15–41)
Bilirubin, Direct: <0.1 mg/dL — ABNORMAL LOW (ref 0.1–0.5)
TOTAL PROTEIN: 7.7 g/dL (ref 6.5–8.1)
Total Bilirubin: 0.5 mg/dL (ref 0.3–1.2)

## 2016-01-25 LAB — URINE MICROSCOPIC-ADD ON

## 2016-01-25 LAB — CBC
HCT: 42.8 % (ref 39.0–52.0)
HEMOGLOBIN: 13.7 g/dL (ref 13.0–17.0)
MCH: 26.7 pg (ref 26.0–34.0)
MCHC: 32 g/dL (ref 30.0–36.0)
MCV: 83.4 fL (ref 78.0–100.0)
Platelets: 440 10*3/uL — ABNORMAL HIGH (ref 150–400)
RBC: 5.13 MIL/uL (ref 4.22–5.81)
RDW: 20.6 % — AB (ref 11.5–15.5)
WBC: 13.4 10*3/uL — ABNORMAL HIGH (ref 4.0–10.5)

## 2016-01-25 LAB — LACTIC ACID, PLASMA
Lactic Acid, Venous: 1.8 mmol/L (ref 0.5–1.9)
Lactic Acid, Venous: 1.1 mmol/L (ref 0.5–1.9)

## 2016-01-25 LAB — BASIC METABOLIC PANEL
Anion gap: 9 (ref 5–15)
BUN: 9 mg/dL (ref 6–20)
CALCIUM: 8.7 mg/dL — AB (ref 8.9–10.3)
CHLORIDE: 103 mmol/L (ref 101–111)
CO2: 24 mmol/L (ref 22–32)
CREATININE: 0.99 mg/dL (ref 0.61–1.24)
GFR calc non Af Amer: >60 mL/min (ref >60)
Glucose, Bld: 124 mg/dL — ABNORMAL HIGH (ref 65–99)
Potassium: 4 mmol/L (ref 3.5–5.1)
SODIUM: 136 mmol/L (ref 135–145)

## 2016-01-25 LAB — LIPASE, BLOOD: LIPASE: 20 U/L (ref 11–51)

## 2016-01-25 MED ORDER — AMITRIPTYLINE HCL 25 MG PO TABS
50.0000 mg | ORAL_TABLET | Freq: Every day | ORAL | Status: DC
  Administered 2016-01-26 – 2016-01-30 (×6): 50 mg via ORAL
  Filled 2016-01-25 (×6): qty 2

## 2016-01-25 MED ORDER — DEXTROSE 5 % IV SOLN
1.0000 g | Freq: Three times a day (TID) | INTRAVENOUS | Status: DC
  Administered 2016-01-26 – 2016-01-29 (×10): 1 g via INTRAVENOUS
  Filled 2016-01-25 (×12): qty 1

## 2016-01-25 MED ORDER — HYDROMORPHONE HCL 1 MG/ML IJ SOLN
1.0000 mg | Freq: Once | INTRAMUSCULAR | Status: DC
  Filled 2016-01-25: qty 1

## 2016-01-25 MED ORDER — HYDROMORPHONE HCL 1 MG/ML IJ SOLN
1.0000 mg | INTRAMUSCULAR | Status: DC | PRN
  Administered 2016-01-26 – 2016-01-29 (×16): 1 mg via INTRAVENOUS
  Filled 2016-01-25 (×17): qty 1

## 2016-01-25 MED ORDER — SODIUM CHLORIDE 0.9 % IV BOLUS (SEPSIS)
1000.0000 mL | Freq: Once | INTRAVENOUS | Status: AC
  Administered 2016-01-25: 1000 mL via INTRAVENOUS

## 2016-01-25 MED ORDER — DEXTROSE 5 % IV SOLN
2.0000 g | Freq: Once | INTRAVENOUS | Status: AC
  Administered 2016-01-25: 2 g via INTRAVENOUS
  Filled 2016-01-25: qty 2

## 2016-01-25 MED ORDER — PREGABALIN 75 MG PO CAPS
150.0000 mg | ORAL_CAPSULE | Freq: Two times a day (BID) | ORAL | Status: DC
  Administered 2016-01-26 – 2016-01-31 (×12): 150 mg via ORAL
  Filled 2016-01-25 (×12): qty 2

## 2016-01-25 MED ORDER — SODIUM CHLORIDE 0.9 % IV SOLN
INTRAVENOUS | Status: DC
  Administered 2016-01-26 – 2016-01-28 (×4): via INTRAVENOUS

## 2016-01-25 MED ORDER — LACTATED RINGERS IV BOLUS (SEPSIS)
1000.0000 mL | Freq: Once | INTRAVENOUS | Status: AC
  Administered 2016-01-25: 1000 mL via INTRAVENOUS

## 2016-01-25 MED ORDER — ENOXAPARIN SODIUM 40 MG/0.4ML ~~LOC~~ SOLN
40.0000 mg | SUBCUTANEOUS | Status: DC
  Administered 2016-01-26: 40 mg via SUBCUTANEOUS
  Filled 2016-01-25: qty 0.4

## 2016-01-25 MED ORDER — ONDANSETRON HCL 4 MG/2ML IJ SOLN: 4.0000 mg | Freq: Four times a day (QID) | INTRAMUSCULAR | Status: DC | PRN

## 2016-01-25 MED ORDER — ZOLPIDEM TARTRATE 5 MG PO TABS
10.0000 mg | ORAL_TABLET | Freq: Every day | ORAL | Status: DC
  Administered 2016-01-26 – 2016-01-30 (×6): 10 mg via ORAL
  Filled 2016-01-25 (×6): qty 2

## 2016-01-25 MED ORDER — HYDROMORPHONE HCL 1 MG/ML IJ SOLN
1.0000 mg | Freq: Once | INTRAMUSCULAR | Status: AC
  Administered 2016-01-25: 1 mg via INTRAVENOUS
  Filled 2016-01-25: qty 1

## 2016-01-25 MED ORDER — DULOXETINE HCL 60 MG PO CPEP
60.0000 mg | ORAL_CAPSULE | Freq: Every day | ORAL | Status: DC
  Administered 2016-01-26 – 2016-01-31 (×6): 60 mg via ORAL
  Filled 2016-01-25 (×6): qty 1

## 2016-01-25 MED ORDER — ONDANSETRON HCL 4 MG PO TABS: 4.0000 mg | ORAL_TABLET | Freq: Four times a day (QID) | ORAL | Status: DC | PRN

## 2016-01-25 MED ORDER — ONDANSETRON HCL 4 MG/2ML IJ SOLN
4.0000 mg | Freq: Once | INTRAMUSCULAR | Status: AC
  Administered 2016-01-25: 4 mg via INTRAVENOUS
  Filled 2016-01-25: qty 2

## 2016-01-25 NOTE — ED Notes (Signed)
MD at bedside. 

## 2016-01-25 NOTE — ED Triage Notes (Signed)
PT states bilateral flank pain starting x2 days ago. PT has suprapubic catheter in place and was last changed at [facility] per patient. PT temp 100.2 today.

## 2016-01-25 NOTE — Progress Notes (Signed)
Pharmacy Antibiotic Note  Andrew Kline is a 50 y.o. male admitted on 01/25/2016 with UTI.  Pharmacy has been consulted for Cefepime dosing.  Initial 2gm IV x 1 dose ordered by EDP.  Plan: Cefepime 1gm IV every 8 hours. Monitor labs, micro and vitals.   Height: '5\' 6"'$  (167.6 cm) Weight: 193 lb (87.5 kg) IBW/kg (Calculated) : 63.8  Temp (24hrs), Avg:100.2 F (37.9 C), Min:100.2 F (37.9 C), Max:100.2 F (37.9 C)   Recent Labs Lab 01/25/16 1805  WBC 13.4*  CREATININE 0.99  LATICACIDVEN 1.8    Estimated Creatinine Clearance: 90.5 mL/min (by C-G formula based on SCr of 0.99 mg/dL).    Allergies  Allergen Reactions  . Oxaliplatin Itching   Antimicrobials this admission:  Cefepime 9/25 >>   Dose adjustments this admission:  n/a   Microbiology results:  Thank you for allowing pharmacy to be a part of this patient's care.  Pricilla Larsson 01/25/2016 8:14 PM

## 2016-01-25 NOTE — H&P (Signed)
TRH H&P    Patient Demographics:    Andrew Kline, is a 50 y.o. male  MRN: [id number]  DOB - 11-02-1965  Admit Date - 01/25/2016  Referring MD/NP/PA: Dr Myrene Buddy  Outpatient Primary MD for the patient is [name], MD  Patient coming from: Home  Chief Complaint  Patient presents with  . Flank Pain      HPI:    Andrew Kline  is a 50 y.o. male, With history of metastatic rectal cancer, renal cell carcinoma right kidney status post partial nephrectomy, Kim date November pain radiating to groin bilaterally. Patient has a suprapubic catheter in place, he has also noticed increased sediment in the urine with some foul-smelling urine. Patient apparently had a CT scan abdomen and pelvis done on 01/13/16, which showed chronic changes. He recently saw urologist Dr. Dorina Hoyer, as outpatient. Patient described pain as 10/10 in intensity, he says that this pain is new and note had discussed been before. Patient also has colostomy bag in place.  In the ED lab work showed WBC 13.4, lactate 1.8, UA showed positive nitrite.  He denies nausea and vomiting. Also has been having more stool output from colostomy. He denies chest pain, no shortness of breath. No fever or chills. Temperature in the ED was 100.2 .   Review of systems:     A full 10 point Review of Systems was done, except as stated above, all other Review of Systems were negative.   With Past History of the following :    Past Medical History:  Diagnosis Date  . Anxiety   . Arthritis   . Depression   . Kidney stone 03/22/15   left  . Rectal cancer ([place])   . Rectal pain   . Renal cell cancer ([place])    2012.   [name] Shortness of breath dyspnea       Past Surgical History:  Procedure Laterality Date  . BIOPSY N/A 09/30/2014   Procedure: BIOPSY;  Surgeon: Danie Binder, MD;  Location: [facility];  Service: Endoscopy;  Laterality: N/A;  Anal  Canal  . COLOSTOMY N/A 02/11/2015   Procedure: COLOSTOMY;  Surgeon: Aviva Signs, MD;  Location: [facility] ORS;  Service: General;  Laterality: N/A;  . FLEXIBLE SIGMOIDOSCOPY N/A 09/30/2014   Procedure: FLEXIBLE SIGMOIDOSCOPY;  Surgeon: Danie Binder, MD;  Location: [facility] ORS;  Service: Endoscopy;  Laterality: N/A;  . FLEXIBLE SIGMOIDOSCOPY N/A 10/17/2014   Procedure: FLEXIBLE SIGMOIDOSCOPY;  Surgeon: Danie Binder, MD;  Location: [facility] ENDO SUITE;  Service: Endoscopy;  Laterality: N/A;  1325  . HYPOSPADIAS CORRECTION    . IR GENERIC HISTORICAL  12/17/2015   IR CATHETER TUBE CHANGE 12/17/2015 [facility]-INTERV RAD  . KNEE SURGERY Left    arthroscopy  . LAPAROSCOPIC PARTIAL NEPHRECTOMY  2012   right side  . LUNG BIOPSY Right   . PERINEAL PROCTECTOMY N/A 08/26/2015   Procedure:  PROCTECTOMY;  Surgeon: Aviva Signs, MD;  Location: [facility] ORS;  Service: General;  Laterality: N/A;  . RECTAL SURGERY  Social History:      Social History  Substance Use Topics  . Smoking status: Former Smoker    Packs/day: 0.50    Years: 30.00    Types: Cigarettes  . Smokeless tobacco: Never Used     Comment: a little over a pack daily  . Alcohol use 0.0 oz/week     Comment: Occ       Family History :     Family History  Problem Relation Age of Onset  . Healthy Mother   . Lung cancer Maternal Grandfather   . Colon cancer Maternal Uncle     age 26  . Diabetes Other      Home Medications:   Prior to Admission medications   Medication Sig Start Date End Date Taking? Authorizing Provider  albuterol (PROVENTIL HFA;VENTOLIN HFA) 108 (90 Base) MCG/ACT inhaler Inhale 2 puffs into the lungs every 6 (six) hours as needed for wheezing or shortness of breath. 05/26/15  Yes Patrici Ranks, MD  amitriptyline (ELAVIL) 50 MG tablet Take 1 tablet (50 mg total) by mouth at bedtime. 12/31/15  Yes Patrici Ranks, MD  docusate sodium (COLACE) 100 MG capsule Take 1 capsule (100 mg total) by mouth every 12 (twelve)  hours. Patient taking differently: Take 100 mg by mouth daily as needed for mild constipation or moderate constipation.  09/25/14  Yes Christopher Lawyer, PA-C  Doxylamine Succinate, Sleep, (SLEEP AID PO) Take 1-2 tablets by mouth at bedtime. Includes Melatonin and Valerian Root   Yes Historical Provider, MD  DULoxetine (CYMBALTA) 60 MG capsule Take 1 capsule (60 mg total) by mouth daily. 12/31/15  Yes Patrici Ranks, MD  fentaNYL (DURAGESIC - DOSED MCG/HR) 75 MCG/HR Place 2 patches (150 mcg total) onto the skin every 3 (three) days. 12/31/15  Yes Patrici Ranks, MD  ondansetron (ZOFRAN ODT) 4 MG disintegrating tablet '4mg'$  ODT q4 hours prn nausea/vomit Patient taking differently: Take 4 mg by mouth every 4 (four) hours as needed for nausea or vomiting.  03/22/15  Yes Merrily Pew, MD  pregabalin (LYRICA) 75 MG capsule Take 2 capsules (150 mg total) by mouth 2 (two) times daily. 12/31/15  Yes Patrici Ranks, MD  prochlorperazine (COMPAZINE) 10 MG tablet Take 1 tablet (10 mg total) by mouth every 6 (six) hours as needed for nausea or vomiting. 11/02/14  Yes Patrici Ranks, MD  HYDROmorphone (DILAUDID) 4 MG tablet Take one tablet by mouth every 4 hours for pain Patient not taking: Reported on 01/25/2016 11/26/15   [facility], PA-C  lidocaine-prilocaine (EMLA) cream Apply a quarter size amount to port site 1 hour prior to chemo. Do not rub in. Cover with plastic wrap. Patient not taking: Reported on 01/25/2016 11/02/14   Patrici Ranks, MD  predniSONE (STERAPRED UNI-PAK 21 TAB) 10 MG (21) TBPK tablet Take 1 tablet (10 mg total) by mouth daily. Take 60 mg PO day 1, 50 mg day 2, 40 mg day 3, 30 mg day 4, 20 mg day 5, 10 mg day 6, STOP 01/19/16 02/18/16  [facility], PA-C  zolpidem (AMBIEN CR) 12.5 MG CR tablet Take 1 tablet (12.5 mg total) by mouth at bedtime as needed for sleep. 12/31/15   Patrici Ranks, MD  zolpidem (AMBIEN) 10 MG tablet Take 1 tablet (10 mg total) by mouth at  bedtime. Patient not taking: Reported on 01/25/2016 09/29/15 12/31/15  [facility], PA-C     Allergies:     Allergies  Allergen  Reactions  . Oxaliplatin Itching     Physical Exam:   Vitals  Blood pressure 130/80, pulse 82, temperature 98.6 F (37 C), temperature source Oral, resp. rate 18, height '5\' 6"'$  (1.676 m), weight 87.5 kg (192 lb 12.8 oz), SpO2 98 %.  1.  General: Appears in no acute distress  2. Psychiatric:  Intact judgement and  insight, awake alert, oriented x 3.  3. Neurologic: No focal neurological deficits, all cranial nerves intact.Strength 5/5 all 4 extremities, sensation intact all 4 extremities, plantars down going.  4. Eyes :  anicteric sclerae, moist conjunctivae with no lid lag. PERRLA.  5. ENMT:  Oropharynx clear with moist mucous membranes and good dentition  6. Neck:  supple, no cervical lymphadenopathy appriciated, No thyromegaly  7. Respiratory : Normal respiratory effort, good air movement bilaterally,clear to  auscultation bilaterally  8. Cardiovascular : RRR, no gallops, rubs or murmurs, no leg edema  9. Gastrointestinal:  Colostomy bag in place, suprapubic catheter in place, no erythema or discharge noted around the catheter site, large ventral hernia noted in left lower quadrant. Positive tenderness noted in the lower back, paraspinal muscles, sacrum. Patient also has persistent yellowish discharge from the urethra.  10. Skin:  No cyanosis, normal texture and turgor, no rash, lesions or ulcers  11.Musculoskeletal:  Good muscle tone,  joints appear normal , no effusions,  normal range of motion    Data Review:    CBC  Recent Labs Lab 01/25/16 1805  WBC 13.4*  HGB 13.7  HCT 42.8  PLT 440*  MCV 83.4  MCH 26.7  MCHC 32.0  RDW 20.6*   ------------------------------------------------------------------------------------------------------------------  Chemistries   Recent Labs Lab 01/25/16 1805  NA 136  K 4.0  CL  103  CO2 24  GLUCOSE 124*  BUN 9  CREATININE 0.99  CALCIUM 8.7*  AST 19  ALT 19  ALKPHOS 117  BILITOT 0.5   ------------------------------------------------------------------------------------------------------------------  ------------------------------------------------------------------------------------------------------------------ GFR: Estimated Creatinine Clearance: 90.5 mL/min (by C-G formula based on SCr of 0.99 mg/dL). Liver Function Tests:  Recent Labs Lab 01/25/16 1805  AST 19  ALT 19  ALKPHOS 117  BILITOT 0.5  PROT 7.7  ALBUMIN 3.6    Recent Labs Lab 01/25/16 1805  LIPASE 20    --------------------------------------------------------------------------------------------------------------- Urine analysis:    Component Value Date/Time   COLORURINE YELLOW 01/25/2016 1725   APPEARANCEUR HAZY (A) 01/25/2016 1725   LABSPEC <1.005 (L) 01/25/2016 1725   PHURINE 6.0 01/25/2016 1725   GLUCOSEU NEGATIVE 01/25/2016 1725   HGBUR MODERATE (A) 01/25/2016 1725   BILIRUBINUR NEGATIVE 01/25/2016 1725   KETONESUR NEGATIVE 01/25/2016 1725   PROTEINUR 30 (A) 01/25/2016 1725   UROBILINOGEN 0.2 03/17/2015 0910   NITRITE POSITIVE (A) 01/25/2016 1725   LEUKOCYTESUR LARGE (A) 01/25/2016 1725      Imaging Results:    Dg Chest Portable 1 View  Result Date: 01/25/2016 CLINICAL DATA:  Acute onset of bilateral flank pain. Low-grade fever. Initial encounter. EXAM: PORTABLE CHEST 1 VIEW COMPARISON:  CT of the chest performed 01/12/2016 FINDINGS: Vascular congestion is noted. Increased interstitial markings raise concern for mild interstitial edema. No pleural effusion or pneumothorax is seen. The cardiomediastinal silhouette is normal in size. No acute osseous abnormalities are seen. A right-sided chest port is noted ending about the mid SVC. IMPRESSION: Vascular congestion noted. Increased interstitial markings raise concern for mild interstitial edema. Electronically Signed    By: Garald Balding M.D.   On: 01/25/2016 21:30    My personal review of  EKG: Rhythm NSR   Assessment & Plan:    Active Problems:   UTI (lower urinary tract infection)   Complicated UTI (urinary tract infection)   1. Complicated UTI- patient started on cefepime per pharmacy consultation, follow urine culture results. Will consult urology in a.m. 2. Metastatic rectal cancer- patient status post surgery, followed by oncology as outpatient. 3. Back /abdominal pain- patient has no back pain surrounding sacrum, he does not want to repeat a CT scan at this time since he had CT scan on 01/13/16, start Dilaudid when necessary. If back pain does not improve with antibiotics, consider CT scan abdomen and pelvis with contrast. Continue amitriptyline, duloxetine. 4. Insomnia- continue Ambien    DVT Prophylaxis-   Lovenox   AM Labs Ordered, also please review Full Orders  Family Communication: No family at bedside  Code Status:  Full code  Admission status: Observation    Time spent in minutes : 60 minutes   Shuan Statzer S M.D on 01/25/2016 at 11:45 PM  Between 7am to 7pm - Pager - [phone]. After 7pm go to [email or web address] - password [facility]  [facility] - Office  [phone]

## 2016-01-25 NOTE — ED Provider Notes (Signed)
[facility] DEPT Provider Note   CSN: [id number] Arrival date & time: 01/25/16  1710     History   Chief Complaint Chief Complaint  Patient presents with  . Flank Pain    HPI Andrew Kline is a 50 y.o. male.  HPI 50 year old male with complex past medical history including metastatic rectal cancer and renal cell carcinoma of right kidney, status post partial nephrectomy, presents with suprapubic and bilateral flank pain. Patient currently has a suprapubic catheter in due to urinary retention. He states that over the last several days he has noticed increased sediment and foul smell in his urine draining into his suprapubic catheter. He has had to flush his catheter several times due to increased sediment. Over the last several days he has had progressively worsening aching, gnawing and cramping, suprapubic and bilateral flank pain. This is been associated with nausea, vomiting and severe flank pain. He states his pain is now 10 out of 10. He has been unable to eat or drink over the last 24 hours due to this pain. He also feels that he has had fevers at home with chills and night sweats.  Past Medical History:  Diagnosis Date  . Anxiety   . Arthritis   . Depression   . Kidney stone 03/22/15   left  . Rectal cancer ([place])   . Rectal pain   . Renal cell cancer ([place])    2012.   [name] Shortness of breath dyspnea     Patient Active Problem List   Diagnosis Date Noted  . UTI (lower urinary tract infection) 01/25/2016  . Complicated UTI (urinary tract infection) 01/25/2016  . Pelvic fluid collection 11/06/2015  . Physical deconditioning 07/07/2015  . Muscular deconditioning 07/07/2015  . Malnutrition of moderate degree ([place]) 02/13/2015  . Rectal carcinoma ([place]) 02/11/2015  . Rectal cancer metastasized to lung ([place]) 10/30/2014  . Rectal mass   . Rectal pain 09/26/2014  . Multiple lung nodules on CT 09/26/2014    Past Surgical History:  Procedure Laterality Date  . BIOPSY N/A  09/30/2014   Procedure: BIOPSY;  Surgeon: Danie Binder, MD;  Location: [facility];  Service: Endoscopy;  Laterality: N/A;  Anal Canal  . COLOSTOMY N/A 02/11/2015   Procedure: COLOSTOMY;  Surgeon: Aviva Signs, MD;  Location: [facility];  Service: General;  Laterality: N/A;  . FLEXIBLE SIGMOIDOSCOPY N/A 09/30/2014   Procedure: FLEXIBLE SIGMOIDOSCOPY;  Surgeon: Danie Binder, MD;  Location: [facility];  Service: Endoscopy;  Laterality: N/A;  . FLEXIBLE SIGMOIDOSCOPY N/A 10/17/2014   Procedure: FLEXIBLE SIGMOIDOSCOPY;  Surgeon: Danie Binder, MD;  Location: [facility] SUITE;  Service: Endoscopy;  Laterality: N/A;  1325  . HYPOSPADIAS CORRECTION    . IR GENERIC HISTORICAL  12/17/2015   IR CATHETER TUBE CHANGE 12/17/2015 [facility]-INTERV RAD  . KNEE SURGERY Left    arthroscopy  . LAPAROSCOPIC PARTIAL NEPHRECTOMY  2012   right side  . LUNG BIOPSY Right   . PERINEAL PROCTECTOMY N/A 08/26/2015   Procedure:  PROCTECTOMY;  Surgeon: Aviva Signs, MD;  Location: [facility];  Service: General;  Laterality: N/A;  . RECTAL SURGERY         Home Medications    Prior to Admission medications   Medication Sig Start Date End Date Taking? Authorizing Provider  albuterol (PROVENTIL HFA;VENTOLIN HFA) 108 (90 Base) MCG/ACT inhaler Inhale 2 puffs into the lungs every 6 (six) hours as needed for wheezing or shortness of breath. 05/26/15  Yes Patrici Ranks, MD  amitriptyline (  ELAVIL) 50 MG tablet Take 1 tablet (50 mg total) by mouth at bedtime. 12/31/15  Yes Patrici Ranks, MD  docusate sodium (COLACE) 100 MG capsule Take 1 capsule (100 mg total) by mouth every 12 (twelve) hours. Patient taking differently: Take 100 mg by mouth daily as needed for mild constipation or moderate constipation.  09/25/14  Yes Christopher Lawyer, PA-C  Doxylamine Succinate, Sleep, (SLEEP AID PO) Take 1-2 tablets by mouth at bedtime. Includes Melatonin and Valerian Root   Yes Historical Provider, MD  DULoxetine (CYMBALTA) 60 MG capsule Take 1 capsule (60  mg total) by mouth daily. 12/31/15  Yes Patrici Ranks, MD  fentaNYL (DURAGESIC - DOSED MCG/HR) 75 MCG/HR Place 2 patches (150 mcg total) onto the skin every 3 (three) days. 12/31/15  Yes Patrici Ranks, MD  ondansetron (ZOFRAN ODT) 4 MG disintegrating tablet '4mg'$  ODT q4 hours prn nausea/vomit Patient taking differently: Take 4 mg by mouth every 4 (four) hours as needed for nausea or vomiting.  03/22/15  Yes Merrily Pew, MD  pregabalin (LYRICA) 75 MG capsule Take 2 capsules (150 mg total) by mouth 2 (two) times daily. 12/31/15  Yes Patrici Ranks, MD  prochlorperazine (COMPAZINE) 10 MG tablet Take 1 tablet (10 mg total) by mouth every 6 (six) hours as needed for nausea or vomiting. 11/02/14  Yes Patrici Ranks, MD  HYDROmorphone (DILAUDID) 4 MG tablet Take one tablet by mouth every 4 hours for pain Patient not taking: Reported on 01/25/2016 11/26/15   Baird Cancer, PA-C  lidocaine-prilocaine (EMLA) cream Apply a quarter size amount to port site 1 hour prior to chemo. Do not rub in. Cover with plastic wrap. Patient not taking: Reported on 01/25/2016 11/02/14   Patrici Ranks, MD  predniSONE (STERAPRED UNI-PAK 21 TAB) 10 MG (21) TBPK tablet Take 1 tablet (10 mg total) by mouth daily. Take 60 mg PO day 1, 50 mg day 2, 40 mg day 3, 30 mg day 4, 20 mg day 5, 10 mg day 6, STOP 01/19/16 02/18/16  Baird Cancer, PA-C  zolpidem (AMBIEN CR) 12.5 MG CR tablet Take 1 tablet (12.5 mg total) by mouth at bedtime as needed for sleep. 12/31/15   Patrici Ranks, MD  zolpidem (AMBIEN) 10 MG tablet Take 1 tablet (10 mg total) by mouth at bedtime. Patient not taking: Reported on 01/25/2016 09/29/15 12/31/15  Baird Cancer, PA-C    Family History Family History  Problem Relation Age of Onset  . Healthy Mother   . Lung cancer Maternal Grandfather   . Colon cancer Maternal Uncle     age 79  . Diabetes Other     Social History Social History  Substance Use Topics  . Smoking status: Former Smoker     Packs/day: 0.50    Years: 30.00    Types: Cigarettes  . Smokeless tobacco: Never Used     Comment: a little over a pack daily  . Alcohol use 0.0 oz/week     Comment: Occ     Allergies   Oxaliplatin   Review of Systems Review of Systems  Constitutional: Positive for chills, fatigue and fever.  HENT: Negative for congestion and rhinorrhea.   Eyes: Negative for visual disturbance.  Respiratory: Negative for cough, shortness of breath and wheezing.   Cardiovascular: Negative for chest pain and leg swelling.  Gastrointestinal: Positive for nausea and vomiting. Negative for abdominal pain and diarrhea.  Genitourinary: Positive for discharge, dysuria and flank pain. Negative for  penile swelling.  Musculoskeletal: Negative for neck pain and neck stiffness.  Skin: Negative for rash and wound.  Allergic/Immunologic: Negative for immunocompromised state.  Neurological: Negative for syncope, weakness and headaches.  All other systems reviewed and are negative.    Physical Exam Updated Vital Signs BP 130/80 (BP Location: Right Arm)   Pulse 82   Temp 98.6 F (37 C) (Oral)   Resp 18   Ht '5\' 6"'$  (1.676 m)   Wt 192 lb 12.8 oz (87.5 kg)   SpO2 98%   BMI 31.12 kg/m   Physical Exam  Constitutional: He is oriented to person, place, and time. He appears well-developed and well-nourished. He appears distressed.  HENT:  Head: Normocephalic and atraumatic.  Dry mucous membranes  Eyes: Conjunctivae are normal.  Neck: Neck supple.  Cardiovascular: Normal rate, regular rhythm and normal heart sounds.  Exam reveals no friction rub.   No murmur heard. Pulmonary/Chest: Effort normal and breath sounds normal. No respiratory distress. He has no wheezes. He has no rales.  Abdominal: Soft. Bowel sounds are normal. He exhibits distension (Moderate suprapubic and bilateral flank tenderness). There is no tenderness. There is no rebound and no guarding.  Genitourinary:  Genitourinary Comments: Small  amount of yellow-green penile discharge.  Musculoskeletal: He exhibits no edema.  Neurological: He is alert and oriented to person, place, and time. He exhibits normal muscle tone.  Skin: Skin is warm. Capillary refill takes less than 2 seconds.  Psychiatric: He has a normal mood and affect.  Nursing note and vitals reviewed.    ED Treatments / Results  Labs (all labs ordered are listed, but only abnormal results are displayed) Labs Reviewed  URINALYSIS, ROUTINE W REFLEX MICROSCOPIC (NOT AT [facility]) - Abnormal; Notable for the following:       Result Value   APPearance HAZY (*)    Specific Gravity, Urine <1.005 (*)    Hgb urine dipstick MODERATE (*)    Protein, ur 30 (*)    Nitrite POSITIVE (*)    Leukocytes, UA LARGE (*)    All other components within normal limits  CBC - Abnormal; Notable for the following:    WBC 13.4 (*)    RDW 20.6 (*)    Platelets 440 (*)    All other components within normal limits  BASIC METABOLIC PANEL - Abnormal; Notable for the following:    Glucose, Bld 124 (*)    Calcium 8.7 (*)    All other components within normal limits  HEPATIC FUNCTION PANEL - Abnormal; Notable for the following:    Bilirubin, Direct <0.1 (*)    All other components within normal limits  URINE MICROSCOPIC-ADD ON - Abnormal; Notable for the following:    Squamous Epithelial / LPF 0-5 (*)    Bacteria, UA FEW (*)    All other components within normal limits  CULTURE, BLOOD (ROUTINE X 2)  CULTURE, BLOOD (ROUTINE X 2)  URINE CULTURE  LACTIC ACID, PLASMA  LACTIC ACID, PLASMA  LIPASE, BLOOD  CBC  COMPREHENSIVE METABOLIC PANEL    EKG  EKG Interpretation None       Radiology Dg Chest Portable 1 View  Result Date: 01/25/2016 CLINICAL DATA:  Acute onset of bilateral flank pain. Low-grade fever. Initial encounter. EXAM: PORTABLE CHEST 1 VIEW COMPARISON:  CT of the chest performed 01/12/2016 FINDINGS: Vascular congestion is noted. Increased interstitial markings raise  concern for mild interstitial edema. No pleural effusion or pneumothorax is seen. The cardiomediastinal silhouette is normal in size. No  acute osseous abnormalities are seen. A right-sided chest port is noted ending about the mid SVC. IMPRESSION: Vascular congestion noted. Increased interstitial markings raise concern for mild interstitial edema. Electronically Signed   By: Garald Balding M.D.   On: 01/25/2016 21:30    Procedures Procedures (including critical care time)  Medications Ordered in ED Medications  ceFEPIme (MAXIPIME) 1 g in dextrose 5 % 50 mL IVPB (not administered)  zolpidem (AMBIEN) tablet 10 mg (10 mg Oral Given 01/26/16 0015)  DULoxetine (CYMBALTA) DR capsule 60 mg (not administered)  amitriptyline (ELAVIL) tablet 50 mg (50 mg Oral Given 01/26/16 0015)  pregabalin (LYRICA) capsule 150 mg (150 mg Oral Given 01/26/16 0015)  enoxaparin (LOVENOX) injection 40 mg (40 mg Subcutaneous Given 01/26/16 0016)  0.9 %  sodium chloride infusion ( Intravenous New Bag/Given 01/26/16 0021)  ondansetron (ZOFRAN) tablet 4 mg (not administered)    Or  ondansetron (ZOFRAN) injection 4 mg (not administered)  HYDROmorphone (DILAUDID) injection 1 mg (1 mg Intravenous Given 01/26/16 0016)  sodium chloride 0.9 % bolus 1,000 mL (0 mLs Intravenous Stopped 01/25/16 2252)  HYDROmorphone (DILAUDID) injection 1 mg (1 mg Intravenous Given 01/25/16 2034)  ondansetron (ZOFRAN) injection 4 mg (4 mg Intravenous Given 01/25/16 2034)  ceFEPIme (MAXIPIME) 2 g in dextrose 5 % 50 mL IVPB (0 g Intravenous Stopped 01/25/16 2121)  sodium chloride 0.9 % bolus 1,000 mL (0 mLs Intravenous Stopped 01/25/16 2252)  lactated ringers bolus 1,000 mL (0 mLs Intravenous Stopped 01/25/16 2252)     Initial Impression / Assessment and Plan / ED Course  I have reviewed the triage vital signs and the nursing notes.  Pertinent labs & imaging results that were available during my care of the patient were reviewed by me and considered in my  medical decision making (see chart for details).  Clinical Course   50 year old male with complex past medical history including metastatic rectal cancer and renal cell carcinoma with chronic indwelling suprapubic catheter who presents with suprapubic and bilateral flank pain. On arrival, patient febrile, tachycardic, without hypotension. He does appear in moderate distress due to pain. Exam, history is most concerning for, KUT I will developing pyelonephritis. Abdomen is otherwise without peritonitis. He has normal ostomy output with no signs of obstruction. Will obtain urinalysis and start code sepsis protocol given tachycardia and suspected infection.  Labs and imaging reviewed as above. CBC shows leukocytosis of 13.4. Urinalysis consistent with UTI with too numerous to count white blood cells. Renal function is at baseline. Cefepime given for healthcare associated UTI and will admit for IV fluids and monitoring.  Final Clinical Impressions(s) / ED Diagnoses   Final diagnoses:  UTI (lower urinary tract infection)  Sepsis, due to unspecified organism [facility])  Leukocytosis    New Prescriptions Current Discharge Medication List       Duffy Bruce, MD 01/26/16 [phone]

## 2016-01-26 ENCOUNTER — Other Ambulatory Visit (HOSPITAL_COMMUNITY)
Admission: RE | Admit: 2016-01-26 | Discharge: 2016-01-26 | Disposition: A | Discharge status: Home / Self Care | Payer: Medicaid Other | Source: Ambulatory Visit | Attending: Oncology | Admitting: Oncology

## 2016-01-26 ENCOUNTER — Observation Stay (HOSPITAL_COMMUNITY): Payer: Medicaid Other

## 2016-01-26 ENCOUNTER — Ambulatory Visit (HOSPITAL_COMMUNITY): Payer: Medicaid Other

## 2016-01-26 ENCOUNTER — Other Ambulatory Visit (HOSPITAL_COMMUNITY): Admission: RE | Admit: 2016-01-26 | Payer: Medicaid Other | Source: Ambulatory Visit

## 2016-01-26 ENCOUNTER — Other Ambulatory Visit (HOSPITAL_COMMUNITY): Payer: Self-pay | Admitting: Pharmacist

## 2016-01-26 DIAGNOSIS — Z905 Acquired absence of kidney: Secondary | ICD-10-CM | POA: Diagnosis not present

## 2016-01-26 DIAGNOSIS — Z79891 Long term (current) use of opiate analgesic: Secondary | ICD-10-CM | POA: Diagnosis not present

## 2016-01-26 DIAGNOSIS — C7800 Secondary malignant neoplasm of unspecified lung: Secondary | ICD-10-CM | POA: Diagnosis not present

## 2016-01-26 DIAGNOSIS — R1909 Other intra-abdominal and pelvic swelling, mass and lump: Secondary | ICD-10-CM | POA: Diagnosis not present

## 2016-01-26 DIAGNOSIS — N390 Urinary tract infection, site not specified: Secondary | ICD-10-CM | POA: Diagnosis present

## 2016-01-26 DIAGNOSIS — Z933 Colostomy status: Secondary | ICD-10-CM | POA: Diagnosis not present

## 2016-01-26 DIAGNOSIS — M1990 Unspecified osteoarthritis, unspecified site: Secondary | ICD-10-CM | POA: Diagnosis present

## 2016-01-26 DIAGNOSIS — Z87891 Personal history of nicotine dependence: Secondary | ICD-10-CM | POA: Diagnosis not present

## 2016-01-26 DIAGNOSIS — C20 Malignant neoplasm of rectum: Secondary | ICD-10-CM | POA: Diagnosis present

## 2016-01-26 DIAGNOSIS — Z85038 Personal history of other malignant neoplasm of large intestine: Secondary | ICD-10-CM | POA: Diagnosis not present

## 2016-01-26 DIAGNOSIS — F329 Major depressive disorder, single episode, unspecified: Secondary | ICD-10-CM | POA: Diagnosis present

## 2016-01-26 DIAGNOSIS — Z85048 Personal history of other malignant neoplasm of rectum, rectosigmoid junction, and anus: Secondary | ICD-10-CM | POA: Diagnosis not present

## 2016-01-26 DIAGNOSIS — R509 Fever, unspecified: Secondary | ICD-10-CM

## 2016-01-26 DIAGNOSIS — Z79899 Other long term (current) drug therapy: Secondary | ICD-10-CM | POA: Diagnosis not present

## 2016-01-26 DIAGNOSIS — G4700 Insomnia, unspecified: Secondary | ICD-10-CM | POA: Diagnosis present

## 2016-01-26 DIAGNOSIS — F419 Anxiety disorder, unspecified: Secondary | ICD-10-CM | POA: Diagnosis present

## 2016-01-26 LAB — COMPREHENSIVE METABOLIC PANEL
ALT: 14 U/L — AB (ref 17–63)
ANION GAP: 6 (ref 5–15)
AST: 14 U/L — ABNORMAL LOW (ref 15–41)
Albumin: 2.8 g/dL — ABNORMAL LOW (ref 3.5–5.0)
Alkaline Phosphatase: 89 U/L (ref 38–126)
BUN: 9 mg/dL (ref 6–20)
CHLORIDE: 105 mmol/L (ref 101–111)
CO2: 27 mmol/L (ref 22–32)
Calcium: 7.9 mg/dL — ABNORMAL LOW (ref 8.9–10.3)
Creatinine, Ser: 0.98 mg/dL (ref 0.61–1.24)
GFR calc non Af Amer: >60 mL/min (ref >60)
Glucose, Bld: 133 mg/dL — ABNORMAL HIGH (ref 65–99)
POTASSIUM: 3.8 mmol/L (ref 3.5–5.1)
SODIUM: 138 mmol/L (ref 135–145)
Total Bilirubin: 0.4 mg/dL (ref 0.3–1.2)
Total Protein: 6.2 g/dL — ABNORMAL LOW (ref 6.5–8.1)

## 2016-01-26 LAB — CBC
HCT: 36.2 % — ABNORMAL LOW (ref 39.0–52.0)
Hemoglobin: 11 g/dL — ABNORMAL LOW (ref 13.0–17.0)
MCH: 26.1 pg (ref 26.0–34.0)
MCHC: 30.4 g/dL (ref 30.0–36.0)
MCV: 85.8 fL (ref 78.0–100.0)
PLATELETS: 357 10*3/uL (ref 150–400)
RBC: 4.22 MIL/uL (ref 4.22–5.81)
RDW: 20.9 % — AB (ref 11.5–15.5)
WBC: 6.2 10*3/uL (ref 4.0–10.5)

## 2016-01-26 LAB — PROTIME-INR
INR: 1
PROTHROMBIN TIME: 13.2 s (ref 11.4–15.2)

## 2016-01-26 MED ORDER — CEFEPIME HCL 1 G IJ SOLR
INTRAMUSCULAR | Status: AC
  Filled 2016-01-26: qty 1

## 2016-01-26 MED ORDER — HEPARIN SODIUM (PORCINE) 5000 UNIT/ML IJ SOLN
5000.0000 [IU] | Freq: Three times a day (TID) | INTRAMUSCULAR | Status: DC
  Administered 2016-01-27 – 2016-01-31 (×11): 5000 [IU] via SUBCUTANEOUS
  Filled 2016-01-26 (×12): qty 1

## 2016-01-26 MED ORDER — ENOXAPARIN SODIUM 40 MG/0.4ML ~~LOC~~ SOLN: 40.0000 mg | SUBCUTANEOUS | Status: DC

## 2016-01-26 NOTE — Progress Notes (Signed)
PROGRESS NOTE    Andrew Kline  YWV:[id number] DOB: [birth year] DOA: 01/25/2016 PCP: [name], MD     Brief Narrative:  50 y/o man admitted from home on 9/25 with abdominal and back pain. Has a complicated GU/rectal anatomy due to h/o rectal cancer with damage to urethra during surgery. He has developed a leak with probable extravasation into the pelvic cavity. Has a suprapubic catheter at present. Believed to have a complicated UTI   Assessment & Plan:   Active Problems:   Rectal carcinoma ([facility])   UTI (lower urinary tract infection)   Complicated UTI (urinary tract infection)   Complicated UTI -Agree with broad spectrum abx pending cx data. -GU consultation requested; may need diverting nephrostomy placement.  Metastatic Rectal Cancer -s/p surgery. -Follow up with oncology as an OP.   DVT prophylaxis: lovenox Code Status: full code Family Communication: patient only Disposition Plan: to be determined  Consultants:   Urology  Procedures:   None  Antimicrobials:   Cefepime 9/25-->    Subjective: Still c/o back pain.  Objective: Vitals:   01/25/16 2130 01/25/16 2230 01/25/16 2344 01/26/16 0435  BP: 115/80 136/78 130/80 111/62  Pulse:   82 79  Resp:   18 18  Temp:   98.6 F (37 C) 97.7 F (36.5 C)  TempSrc:   Oral Oral  SpO2:   98% 94%  Weight:   87.5 kg (192 lb 12.8 oz)   Height:   '5\' 6"'$  (1.676 m)     Intake/Output Summary (Last 24 hours) at 01/26/16 1342 Last data filed at 01/26/16 1301  Gross per 24 hour  Intake              480 ml  Output             1900 ml  Net            -1420 ml   Filed Weights   01/25/16 1722 01/25/16 2344  Weight: 87.5 kg (193 lb) 87.5 kg (192 lb 12.8 oz)    Examination:  General exam: Alert, awake, oriented x 3 Respiratory system: Clear to auscultation. Respiratory effort normal. Cardiovascular system:RRR. No murmurs, rubs, gallops. Gastrointestinal system: Abdomen is nondistended, soft and  nontender. No organomegaly or masses felt. Normal bowel sounds heard. Central nervous system: Alert and oriented. No focal neurological deficits. Extremities: No C/C/E, +pedal pulses Skin: No rashes, lesions or ulcers Psychiatry: Judgement and insight appear normal. Mood & affect appropriate.     Data Reviewed: I have personally reviewed following labs and imaging studies  CBC:  Recent Labs Lab 01/25/16 1805 01/26/16 0546  WBC 13.4* 6.2  HGB 13.7 11.0*  HCT 42.8 36.2*  MCV 83.4 85.8  PLT 440* 854   Basic Metabolic Panel:  Recent Labs Lab 01/25/16 1805 01/26/16 0546  NA 136 138  K 4.0 3.8  CL 103 105  CO2 24 27  GLUCOSE 124* 133*  BUN 9 9  CREATININE 0.99 0.98  CALCIUM 8.7* 7.9*   GFR: Estimated Creatinine Clearance: 91.4 mL/min (by C-G formula based on SCr of 0.98 mg/dL). Liver Function Tests:  Recent Labs Lab 01/25/16 1805 01/26/16 0546  AST 19 14*  ALT 19 14*  ALKPHOS 117 89  BILITOT 0.5 0.4  PROT 7.7 6.2*  ALBUMIN 3.6 2.8*    Recent Labs Lab 01/25/16 1805  LIPASE 20   No results for input(s): AMMONIA in the last 168 hours. Coagulation Profile: No results for input(s): INR, PROTIME in the last 168 hours.  Cardiac Enzymes: No results for input(s): CKTOTAL, CKMB, CKMBINDEX, TROPONINI in the last 168 hours. BNP (last 3 results) No results for input(s): PROBNP in the last 8760 hours. HbA1C: No results for input(s): HGBA1C in the last 72 hours. CBG: No results for input(s): GLUCAP in the last 168 hours. Lipid Profile: No results for input(s): CHOL, HDL, LDLCALC, TRIG, CHOLHDL, LDLDIRECT in the last 72 hours. Thyroid Function Tests: No results for input(s): TSH, T4TOTAL, FREET4, T3FREE, THYROIDAB in the last 72 hours. Anemia Panel: No results for input(s): VITAMINB12, FOLATE, FERRITIN, TIBC, IRON, RETICCTPCT in the last 72 hours. Urine analysis:    Component Value Date/Time   COLORURINE YELLOW 01/25/2016 1725   APPEARANCEUR HAZY (A) 01/25/2016  1725   LABSPEC <1.005 (L) 01/25/2016 1725   PHURINE 6.0 01/25/2016 1725   GLUCOSEU NEGATIVE 01/25/2016 1725   HGBUR MODERATE (A) 01/25/2016 1725   BILIRUBINUR NEGATIVE 01/25/2016 1725   KETONESUR NEGATIVE 01/25/2016 1725   PROTEINUR 30 (A) 01/25/2016 1725   UROBILINOGEN 0.2 03/17/2015 0910   NITRITE POSITIVE (A) 01/25/2016 1725   LEUKOCYTESUR LARGE (A) 01/25/2016 1725   Sepsis Labs: '@LABRCNTIP'$ (procalcitonin:4,lacticidven:4)  ) Recent Results (from the past 240 hour(s))  Urine culture     Status: None (Preliminary result)   Collection Time: 01/25/16  7:49 PM  Result Value Ref Range Status   Specimen Description URINE, SUPRAPUBIC  Final   Special Requests NONE  Final   Culture PENDING  Incomplete   Report Status PENDING  Incomplete  Blood culture (routine x 2)     Status: None (Preliminary result)   Collection Time: 01/25/16  8:20 PM  Result Value Ref Range Status   Specimen Description RIGHT ANTECUBITAL  Final   Special Requests BOTTLES DRAWN AEROBIC AND ANAEROBIC 6CC  Final   Culture NO GROWTH < 12 HOURS  Final   Report Status PENDING  Incomplete  Blood culture (routine x 2)     Status: None (Preliminary result)   Collection Time: 01/25/16  8:38 PM  Result Value Ref Range Status   Specimen Description LEFT ANTECUBITAL  Final   Special Requests BOTTLES DRAWN AEROBIC AND ANAEROBIC [facility]  Final   Culture PENDING  Incomplete   Report Status PENDING  Incomplete         Radiology Studies: Dg Chest Portable 1 View  Result Date: 01/25/2016 CLINICAL DATA:  Acute onset of bilateral flank pain. Low-grade fever. Initial encounter. EXAM: PORTABLE CHEST 1 VIEW COMPARISON:  CT of the chest performed 01/12/2016 FINDINGS: Vascular congestion is noted. Increased interstitial markings raise concern for mild interstitial edema. No pleural effusion or pneumothorax is seen. The cardiomediastinal silhouette is normal in size. No acute osseous abnormalities are seen. A right-sided chest port is  noted ending about the mid SVC. IMPRESSION: Vascular congestion noted. Increased interstitial markings raise concern for mild interstitial edema. Electronically Signed   By: Garald Balding M.D.   On: 01/25/2016 21:30        Scheduled Meds: . amitriptyline  50 mg Oral QHS  . ceFEPime (MAXIPIME) IV  1 g Intravenous Q8H  . DULoxetine  60 mg Oral Daily  . enoxaparin (LOVENOX) injection  40 mg Subcutaneous Q24H  . pregabalin  150 mg Oral BID  . zolpidem  10 mg Oral QHS   Continuous Infusions: . sodium chloride 75 mL/hr at 01/26/16 0021     LOS: 0 days    Time spent: 25 minutes. Greater than 50% of this time was spent in direct contact with the patient  coordinating care.     Lelon Frohlich, MD [facility] Pager [phone]  If 7PM-7AM, please contact night-coverage [email or web address] Password [email or web address] 01/26/2016, 1:42 PM

## 2016-01-26 NOTE — Consult Note (Signed)
Urology Consult  Consulting SW:FUXNATFTD ACOSTA  CC: fever, pelvic fluid collection  HPI: This is a 50 year old male with a history of progressive rectal/colon adenocarcinoma.  He is status post radiotherapy an  At the time of his APR, there was an unintended entry into the lower urinary tract.  The patient developed a leak, and has probable extravasation of urine into the pelvic cavity.  He has a suprapubic tube draining his bladder.  He was admitted to the hospital because of fever and low back pain.  He has had intermittent but persistent purulent drainage out of his urethra  He passed some urine as well as purulent drainage, and this fluid drained out.  PMH: Past Medical History:  Diagnosis Date  . Anxiety   . Arthritis   . Depression   . Kidney stone 03/22/15   left  . Rectal cancer ([place])   . Rectal pain   . Renal cell cancer ([place])    2012.   [name] Shortness of breath dyspnea     PSH: Past Surgical History:  Procedure Laterality Date  . BIOPSY N/A 09/30/2014   Procedure: BIOPSY;  Surgeon: Danie Binder, MD;  Location: [facility];  Service: Endoscopy;  Laterality: N/A;  Anal Canal  . COLOSTOMY N/A 02/11/2015   Procedure: COLOSTOMY;  Surgeon: Aviva Signs, MD;  Location: [facility];  Service: General;  Laterality: N/A;  . FLEXIBLE SIGMOIDOSCOPY N/A 09/30/2014   Procedure: FLEXIBLE SIGMOIDOSCOPY;  Surgeon: Danie Binder, MD;  Location: [facility];  Service: Endoscopy;  Laterality: N/A;  . FLEXIBLE SIGMOIDOSCOPY N/A 10/17/2014   Procedure: FLEXIBLE SIGMOIDOSCOPY;  Surgeon: Danie Binder, MD;  Location: [facility];  Service: Endoscopy;  Laterality: N/A;  1325  . HYPOSPADIAS CORRECTION    . IR GENERIC HISTORICAL  12/17/2015   IR CATHETER TUBE CHANGE 12/17/2015 [facility]-INTERV RAD  . KNEE SURGERY Left    arthroscopy  . LAPAROSCOPIC PARTIAL NEPHRECTOMY  2012   right side  . LUNG BIOPSY Right   . PERINEAL PROCTECTOMY N/A 08/26/2015   Procedure:  PROCTECTOMY;  Surgeon: Aviva Signs, MD;  Location:  [facility];  Service: General;  Laterality: N/A;  . RECTAL SURGERY      Allergies: Allergies  Allergen Reactions  . Oxaliplatin Itching    Medications: Prescriptions Prior to Admission  Medication Sig Dispense Refill Last Dose  . albuterol (PROVENTIL HFA;VENTOLIN HFA) 108 (90 Base) MCG/ACT inhaler Inhale 2 puffs into the lungs every 6 (six) hours as needed for wheezing or shortness of breath. 1 Inhaler 2 01/24/2016 at Unknown time  . amitriptyline (ELAVIL) 50 MG tablet Take 1 tablet (50 mg total) by mouth at bedtime. 30 tablet 3 01/24/2016 at Unknown time  . docusate sodium (COLACE) 100 MG capsule Take 1 capsule (100 mg total) by mouth every 12 (twelve) hours. (Patient taking differently: Take 100 mg by mouth daily as needed for mild constipation or moderate constipation. ) 60 capsule 0 unknown  . Doxylamine Succinate, Sleep, (SLEEP AID PO) Take 1-2 tablets by mouth at bedtime. Includes Melatonin and Valerian Root   01/24/2016 at Unknown time  . DULoxetine (CYMBALTA) 60 MG capsule Take 1 capsule (60 mg total) by mouth daily. 30 capsule 3 01/25/2016 at Unknown time  . fentaNYL (DURAGESIC - DOSED MCG/HR) 75 MCG/HR Place 2 patches (150 mcg total) onto the skin every 3 (three) days. 20 patch 0 Past Week at Unknown time  . ondansetron (ZOFRAN ODT) 4 MG disintegrating tablet '4mg'$  ODT q4 hours prn nausea/vomit (  Patient taking differently: Take 4 mg by mouth every 4 (four) hours as needed for nausea or vomiting. ) 20 tablet 0 unknown  . pregabalin (LYRICA) 75 MG capsule Take 2 capsules (150 mg total) by mouth 2 (two) times daily. 120 capsule 3 01/25/2016 at morning  . prochlorperazine (COMPAZINE) 10 MG tablet Take 1 tablet (10 mg total) by mouth every 6 (six) hours as needed for nausea or vomiting. 60 tablet 0 unknown  . HYDROmorphone (DILAUDID) 4 MG tablet Take one tablet by mouth every 4 hours for pain (Patient not taking: Reported on 01/25/2016) 60 tablet 0 Not Taking at Unknown time  . lidocaine-prilocaine  (EMLA) cream Apply a quarter size amount to port site 1 hour prior to chemo. Do not rub in. Cover with plastic wrap. (Patient not taking: Reported on 01/25/2016) 30 g 3 Not Taking at Unknown time  . predniSONE (STERAPRED UNI-PAK 21 TAB) 10 MG (21) TBPK tablet Take 1 tablet (10 mg total) by mouth daily. Take 60 mg PO day 1, 50 mg day 2, 40 mg day 3, 30 mg day 4, 20 mg day 5, 10 mg day 6, STOP 21 tablet 0   . zolpidem (AMBIEN CR) 12.5 MG CR tablet Take 1 tablet (12.5 mg total) by mouth at bedtime as needed for sleep. 30 tablet 0 Taking  . zolpidem (AMBIEN) 10 MG tablet Take 1 tablet (10 mg total) by mouth at bedtime. (Patient not taking: Reported on 01/25/2016) 30 tablet 2 Not Taking at Unknown time     Social History: Social History   Social History  . Marital status: Divorced    Spouse name: N/A  . Number of children: 2  . Years of education: N/A   Occupational History  . retail    Social History Main Topics  . Smoking status: Former Smoker    Packs/day: 0.50    Years: 30.00    Types: Cigarettes  . Smokeless tobacco: Never Used     Comment: a little over a pack daily  . Alcohol use 0.0 oz/week     Comment: Occ  . Drug use: No  . Sexual activity: Yes    Birth control/ protection: None   Other Topics Concern  . Not on file   Social History Narrative  . No narrative on file    Family History: Family History  Problem Relation Age of Onset  . Healthy Mother   . Lung cancer Maternal Grandfather   . Colon cancer Maternal Uncle     age 40  . Diabetes Other     Review of Systems: Positive: fever, lower abdominal, low back pain.  Purulent drainage from his uret Negative:  A further 10 point review of systems was negative except what is listed in the HPI.  Physical Exam: '@VITALS2'$ @ General: No acute distress.  Awake. Head:  Normocephalic.  Atraumatic. ENT:  EOMI.  Mucous membranes moist Neck:  Supple.  No lymphadenopathy. CV:  S1 present. S2 present. Regular  rate. Pulmonary: Equal effort bilaterally.  Clear to auscultation bilaterally. Abdomen: Soft.  Colostomy and lower quadrant. Skin:  Normal turgor.  No visible rash. Extremity: No gross deformity of bilateral upper extremities.  No gross deformity of    bilateral lower extremities. Neurologic: Alert. Appropriate mood.  Penis:           circumcised.  No lesions. Urethra:   Orthotopic meatus.mild degree of purulent discharge Scrotum: No lesions.  No ecchymosis.  No erythema. Testicles: Descended bilaterally.  No masses bilaterally.  Epididymis: Palpable bilaterally.  Non Tender to palpation.  Studies:  Recent Labs     01/25/16  1805  01/26/16  0546  HGB  13.7  11.0*  WBC  13.4*  6.2  PLT  440*  357    Recent Labs     01/25/16  1805  01/26/16  0546  NA  136  138  K  4.0  3.8  CL  103  105  CO2  24  27  BUN  9  9  CREATININE  0.99  0.98  CALCIUM  8.7*  7.9*  GFRNONAA  >60  >60  GFRAA  >60  >60     No results for input(s): INR, APTT in the last 72 hours.  Invalid input(s): PT   Invalid input(s): ABG    Assessment:  History of rectal cancer, status post radiotherapy as well as abdominoperineal resection.  There was an unintendedinjury to his bladder neck/prostatic urethra at the time of surgery.  He now has a suprapubic tube draining his bladder, but has a pelvic fluid collection that is probably a combination of  Urine and pus.  Plan: I have spoken with the patient as well as his wife.  This is a difficult situation.  At this point, it may be worthwhile to try to divert all of his urine from his bladder with bilateral percutaneous nephrostomy tubes.  This would hopefully limit the amount of fluid going from his bladder neck into this pelvic cavity.  This would hopefully help this area heal/drain appropriately.  I will order nephrostomy tubes to be placed, and at a later time today speak with interventional radiology in [facility] about performing this procedure.  If this  does not clear the patient's infection and pain, I would recommend rescanning him, and if necessary, drain any pelvic fluid collection through percutaneous means.    [phone]

## 2016-01-26 NOTE — Care Management Note (Signed)
Case Management Note  Patient Details  Name: Andrew Kline MRN: [id number] Date of Birth: April 04, 1966  Subjective/Objective:                  Pt admitted with UTI. He is from home, lives with his wife and is ind with ADL's. He has PCP, transportation and no difficulty affording medications. He has no HH services or DME needs PTA. He plans on returning home with self care at DC.   Action/Plan: No CM needs anticipated.   Expected Discharge Date:  01/27/16               Expected Discharge Plan:  Home/Self Care  In-House Referral:  NA  Discharge planning Services  CM Consult  Post Acute Care Choice:  NA Choice offered to:  NA  DME Arranged:    DME Agency:     HH Arranged:    HH Agency:     Status of Service:  Completed, signed off  If discussed at [employer] of Stay Meetings, dates discussed:    Additional Comments:  Sherald Barge, RN 01/26/2016, 2:09 PM

## 2016-01-27 ENCOUNTER — Ambulatory Visit (HOSPITAL_COMMUNITY)
Admit: 2016-01-27 | Discharge: 2016-01-27 | Disposition: A | Discharge status: Home / Self Care | Payer: Medicaid Other | Source: Ambulatory Visit | Attending: Urology | Admitting: Urology

## 2016-01-27 ENCOUNTER — Encounter (HOSPITAL_COMMUNITY): Payer: Self-pay

## 2016-01-27 DIAGNOSIS — Z7952 Long term (current) use of systemic steroids: Secondary | ICD-10-CM | POA: Insufficient documentation

## 2016-01-27 DIAGNOSIS — Z87891 Personal history of nicotine dependence: Secondary | ICD-10-CM | POA: Insufficient documentation

## 2016-01-27 DIAGNOSIS — Z905 Acquired absence of kidney: Secondary | ICD-10-CM

## 2016-01-27 DIAGNOSIS — C20 Malignant neoplasm of rectum: Secondary | ICD-10-CM

## 2016-01-27 DIAGNOSIS — T83038A Leakage of other indwelling urethral catheter, initial encounter: Secondary | ICD-10-CM

## 2016-01-27 DIAGNOSIS — F329 Major depressive disorder, single episode, unspecified: Secondary | ICD-10-CM

## 2016-01-27 DIAGNOSIS — F419 Anxiety disorder, unspecified: Secondary | ICD-10-CM

## 2016-01-27 DIAGNOSIS — Z801 Family history of malignant neoplasm of trachea, bronchus and lung: Secondary | ICD-10-CM | POA: Insufficient documentation

## 2016-01-27 DIAGNOSIS — N390 Urinary tract infection, site not specified: Principal | ICD-10-CM

## 2016-01-27 DIAGNOSIS — Z85528 Personal history of other malignant neoplasm of kidney: Secondary | ICD-10-CM

## 2016-01-27 DIAGNOSIS — Z800 Family history of malignant neoplasm of digestive organs: Secondary | ICD-10-CM | POA: Insufficient documentation

## 2016-01-27 DIAGNOSIS — Z87442 Personal history of urinary calculi: Secondary | ICD-10-CM | POA: Insufficient documentation

## 2016-01-27 DIAGNOSIS — Y733 Surgical instruments, materials and gastroenterology and urology devices (including sutures) associated with adverse incidents: Secondary | ICD-10-CM

## 2016-01-27 DIAGNOSIS — Z833 Family history of diabetes mellitus: Secondary | ICD-10-CM | POA: Insufficient documentation

## 2016-01-27 DIAGNOSIS — Z85048 Personal history of other malignant neoplasm of rectum, rectosigmoid junction, and anus: Secondary | ICD-10-CM

## 2016-01-27 HISTORY — PX: IR GENERIC HISTORICAL: IMG1180011

## 2016-01-27 LAB — BASIC METABOLIC PANEL
Anion gap: 5 (ref 5–15)
BUN: 10 mg/dL (ref 6–20)
CHLORIDE: 103 mmol/L (ref 101–111)
CO2: 29 mmol/L (ref 22–32)
Calcium: 8.3 mg/dL — ABNORMAL LOW (ref 8.9–10.3)
Creatinine, Ser: 0.83 mg/dL (ref 0.61–1.24)
GFR calc non Af Amer: >60 mL/min (ref >60)
Glucose, Bld: 105 mg/dL — ABNORMAL HIGH (ref 65–99)
POTASSIUM: 3.7 mmol/L (ref 3.5–5.1)
SODIUM: 137 mmol/L (ref 135–145)

## 2016-01-27 LAB — CBC
HEMATOCRIT: 38.6 % — AB (ref 39.0–52.0)
Hemoglobin: 12.1 g/dL — ABNORMAL LOW (ref 13.0–17.0)
MCH: 26.8 pg (ref 26.0–34.0)
MCHC: 31.3 g/dL (ref 30.0–36.0)
MCV: 85.6 fL (ref 78.0–100.0)
PLATELETS: 343 10*3/uL (ref 150–400)
RBC: 4.51 MIL/uL (ref 4.22–5.81)
RDW: 20.5 % — ABNORMAL HIGH (ref 11.5–15.5)
WBC: 5.8 10*3/uL (ref 4.0–10.5)

## 2016-01-27 MED ORDER — MIDAZOLAM HCL 2 MG/2ML IJ SOLN
INTRAMUSCULAR | Status: AC | PRN
  Administered 2016-01-27: 1 mg via INTRAVENOUS
  Administered 2016-01-27: 0.5 mg via INTRAVENOUS
  Administered 2016-01-27 (×2): 1 mg via INTRAVENOUS

## 2016-01-27 MED ORDER — IOPAMIDOL (ISOVUE-300) INJECTION 61%
INTRAVENOUS | Status: AC
  Administered 2016-01-27: 30 mL
  Filled 2016-01-27: qty 50

## 2016-01-27 MED ORDER — LIDOCAINE HCL 1 % IJ SOLN
INTRAMUSCULAR | Status: AC
Start: 2016-01-27 — End: 2016-01-27
  Filled 2016-01-27: qty 20

## 2016-01-27 MED ORDER — LIDOCAINE HCL 1 % IJ SOLN
INTRAMUSCULAR | Status: AC
Start: 2016-01-27 — End: 2016-01-27
  Administered 2016-01-27: 20 mL
  Filled 2016-01-27: qty 20

## 2016-01-27 MED ORDER — MIDAZOLAM HCL 2 MG/2ML IJ SOLN
INTRAMUSCULAR | Status: AC
Start: 2016-01-27 — End: 2016-01-27
  Filled 2016-01-27: qty 4

## 2016-01-27 MED ORDER — FENTANYL CITRATE (PF) 100 MCG/2ML IJ SOLN
INTRAMUSCULAR | Status: AC
  Filled 2016-01-27: qty 2

## 2016-01-27 MED ORDER — FENTANYL CITRATE (PF) 100 MCG/2ML IJ SOLN
INTRAMUSCULAR | Status: AC | PRN
  Administered 2016-01-27: 50 ug via INTRAVENOUS
  Administered 2016-01-27: 25 ug via INTRAVENOUS
  Administered 2016-01-27: 50 ug via INTRAVENOUS
  Administered 2016-01-27: 25 ug via INTRAVENOUS
  Administered 2016-01-27: 50 ug via INTRAVENOUS

## 2016-01-27 MED ORDER — FENTANYL CITRATE (PF) 100 MCG/2ML IJ SOLN
INTRAMUSCULAR | Status: AC
  Filled 2016-01-27: qty 4

## 2016-01-27 NOTE — Progress Notes (Signed)
PROGRESS NOTE    Andrew Kline  IRW:[id number] DOB: [birth year] DOA: 01/25/2016 PCP: [name], MD    Brief Narrative:  50 y/o man admitted from home on 9/25 with abdominal and back pain. Has a complicated GU/rectal anatomy due to h/o rectal cancer with damage to urethra during surgery. He has developed a leak with probable extravasation into the pelvic cavity. Has a suprapubic catheter at present. Believed to have a complicated UTI.  He has been on Cefepime, and urology has seen him.  He is being transfer to IR at [facility] for placement of bilateral diverting nephrostomy tubes.  He is alert, orient, conversing with stable hemodynamics.    Assessment & Plan:   Active Problems:   Rectal carcinoma (HCC)   UTI (lower urinary tract infection)   Complicated UTI (urinary tract infection)  Complicated UTI -Agree with broad spectrum abx pending cx data. -GU following. -Getting bilateral nephrostomy tube today at IR [facility], then return to [facility].   Metastatic Rectal Cancer -s/p surgery. -Follow up with oncology as an OP.   DVT prophylaxis:  Lovenox Code Status: FULL CODE (reconfirmed by me today) Family Communication: None. Disposition Plan: likely to home when appropriate.   Consultants:   Urology.   Procedures:   Diverting nephrostomy tube bilaterally.   Antimicrobials: Anti-infectives    Start     Dose/Rate Route Frequency Ordered Stop   01/26/16 0600  ceFEPIme (MAXIPIME) 1 g in dextrose 5 % 50 mL IVPB     1 g 100 mL/hr over 30 Minutes Intravenous Every 8 hours 01/25/16 2017     01/25/16 2000  ceFEPIme (MAXIPIME) 2 g in dextrose 5 % 50 mL IVPB     2 g 100 mL/hr over 30 Minutes Intravenous  Once 01/25/16 1952 01/25/16 2121       Subjective:  Doing better.  Pain is adequately controlled.    Objective: Vitals:   01/26/16 0435 01/26/16 1416 01/26/16 2028 01/27/16 0509  BP: 111/62 120/66 119/67 140/77  Pulse: 79 74 75 86  Resp: '18 18 18 18  '$ Temp: 97.7 F  (36.5 C) 98.6 F (37 C) 98.5 F (36.9 C) 98.8 F (37.1 C)  TempSrc: Oral Oral Oral Oral  SpO2: 94% 97% 98% 99%  Weight:      Height:        Intake/Output Summary (Last 24 hours) at 01/27/16 0735 Last data filed at 01/27/16 0500  Gross per 24 hour  Intake             1825 ml  Output             5450 ml  Net            -3625 ml   Filed Weights   01/25/16 1722 01/25/16 2344  Weight: 87.5 kg (193 lb) 87.5 kg (192 lb 12.8 oz)    Examination:  General exam: Appears calm and comfortable  Respiratory system: Clear to auscultation. Respiratory effort normal. Cardiovascular system: S1 & S2 heard, RRR. No JVD, murmurs, rubs, gallops or clicks. No pedal edema. Gastrointestinal system: Abdomen is nondistended, soft and nontender. No organomegaly or masses felt. Normal bowel sounds heard. Ostomy bag on the LLQ. Central nervous system: Alert and oriented. No focal neurological deficits. Extremities: Symmetric 5 x 5 power. Skin: No rashes, lesions or ulcers Psychiatry: Judgement and insight appear normal. Mood & affect appropriate.   Data Reviewed: I have personally reviewed following labs and imaging studies  CBC:  Recent Labs Lab 01/25/16 1805  01/26/16 0546 01/27/16 0515  WBC 13.4* 6.2 5.8  HGB 13.7 11.0* 12.1*  HCT 42.8 36.2* 38.6*  MCV 83.4 85.8 85.6  PLT 440* 357 627   Basic Metabolic Panel:  Recent Labs Lab 01/25/16 1805 01/26/16 0546 01/27/16 0515  NA 136 138 137  K 4.0 3.8 3.7  CL 103 105 103  CO2 '24 27 29  '$ GLUCOSE 124* 133* 105*  BUN '9 9 10  '$ CREATININE 0.99 0.98 0.83  CALCIUM 8.7* 7.9* 8.3*   GFR: Estimated Creatinine Clearance: 107.9 mL/min (by C-G formula based on SCr of 0.83 mg/dL). Liver Function Tests:  Recent Labs Lab 01/25/16 1805 01/26/16 0546  AST 19 14*  ALT 19 14*  ALKPHOS 117 89  BILITOT 0.5 0.4  PROT 7.7 6.2*  ALBUMIN 3.6 2.8*    Recent Labs Lab 01/25/16 1805  LIPASE 20   Coagulation Profile:  Recent Labs Lab  01/26/16 1540  INR 1.00   Sepsis Labs:  Recent Labs Lab 01/25/16 1805 01/25/16 2038  LATICACIDVEN 1.8 1.1    Recent Results (from the past 240 hour(s))  Urine culture     Status: None (Preliminary result)   Collection Time: 01/25/16  7:49 PM  Result Value Ref Range Status   Specimen Description URINE, SUPRAPUBIC  Final   Special Requests NONE  Final   Culture PENDING  Incomplete   Report Status PENDING  Incomplete  Blood culture (routine x 2)     Status: None (Preliminary result)   Collection Time: 01/25/16  8:20 PM  Result Value Ref Range Status   Specimen Description RIGHT ANTECUBITAL  Final   Special Requests BOTTLES DRAWN AEROBIC AND ANAEROBIC 6CC  Final   Culture NO GROWTH < 12 HOURS  Final   Report Status PENDING  Incomplete  Blood culture (routine x 2)     Status: None (Preliminary result)   Collection Time: 01/25/16  8:38 PM  Result Value Ref Range Status   Specimen Description LEFT ANTECUBITAL  Final   Special Requests BOTTLES DRAWN AEROBIC AND ANAEROBIC [facility]  Final   Culture PENDING  Incomplete   Report Status PENDING  Incomplete     Radiology Studies: Ct Abdomen Pelvis Wo Contrast  Result Date: 01/26/2016 CLINICAL DATA:  Bilateral flank pain for 3 days. Suprapubic catheter. Rectal cancer. Lung metastasis. EXAM: CT ABDOMEN AND PELVIS WITHOUT CONTRAST TECHNIQUE: Multidetector CT imaging of the abdomen and pelvis was performed following the standard protocol without IV contrast. COMPARISON:  CT 01/12/2016 FINDINGS: Lower chest: Bilateral lower lobe pulmonary nodules again demonstrated. Hepatobiliary: No focal hepatic lesions noncontrast exam. Normal gallbladder. Pancreas: Multiple splenic calcifications within the pancreatic parenchyma. No duct dilatation Spleen: Normal spleen. Adrenals/Urinary Tract: Adrenal glands normal. Dilatation of the RIGHT renal pelvis and mild RIGHT hydroureter. No obstructing lesion identified. LEFT ureter is normal. Suprapubic pubic catheter  within the lumen of the bladder. Bladder is collapsed. In the presacral space, there is a rounded low-density fluid collection measuring 3.4 by 3.6 cm and not changed from comparison CT. Stomach/Bowel: Stomach, small bowel appendix cecum normal. The long segment of small bowel enters a LEFT peristomal hernia without obstruction. This appears to be an abandoned ostomy. The colon and rectosigmoid colon are normal prior proctectomy. Vascular/Lymphatic: Abdominal aorta is normal caliber with atherosclerotic calcification. There is no retroperitoneal or periportal lymphadenopathy. No pelvic lymphadenopathy. Reproductive: Prostatectomy Other: No free fluid or free air Musculoskeletal: No aggressive osseous lesion. IMPRESSION: 1. No clear explanation for RIGHT-sided pain. No significant change from CT 2 weeks  prior. 2. Mild RIGHT hydroureter and hydronephrosis is similar to comparison exam. 3. Suprapubic catheter within a collapsed bladder. 4. Stable rounded fluid collection in the presacral space. 5. Long segment of small bowel enters a LEFT lower quadrant peristomal hernia. No evidence of bowel obstruction. 6. Pulmonary metastasis. Electronically Signed   By: Suzy Bouchard M.D.   On: 01/26/2016 16:54   Dg Chest Portable 1 View  Result Date: 01/25/2016 CLINICAL DATA:  Acute onset of bilateral flank pain. Low-grade fever. Initial encounter. EXAM: PORTABLE CHEST 1 VIEW COMPARISON:  CT of the chest performed 01/12/2016 FINDINGS: Vascular congestion is noted. Increased interstitial markings raise concern for mild interstitial edema. No pleural effusion or pneumothorax is seen. The cardiomediastinal silhouette is normal in size. No acute osseous abnormalities are seen. A right-sided chest port is noted ending about the mid SVC. IMPRESSION: Vascular congestion noted. Increased interstitial markings raise concern for mild interstitial edema. Electronically Signed   By: Garald Balding M.D.   On: 01/25/2016 21:30     Scheduled Meds: . amitriptyline  50 mg Oral QHS  . ceFEPime (MAXIPIME) IV  1 g Intravenous Q8H  . DULoxetine  60 mg Oral Daily  . heparin subcutaneous  5,000 Units Subcutaneous Q8H  . pregabalin  150 mg Oral BID  . zolpidem  10 mg Oral QHS   Continuous Infusions: . sodium chloride 75 mL/hr at 01/26/16 0021     LOS: 1 day   Richardine Peppers, MD FACP Hospitalist.   If 7PM-7AM, please contact night-coverage [email or web address] Password [facility] 01/27/2016, 7:35 AM

## 2016-01-27 NOTE — Sedation Documentation (Signed)
Patient is resting comfortably. 

## 2016-01-27 NOTE — H&P (Signed)
Chief Complaint: Patient was seen in consultation today for Bilateral percutaneous nephrostomy tube placement at the request of [place]  Referring Physician(s): [place]  Supervising Physician: Sandi Mariscal  Patient Status: In patient  History of Present Illness: Andrew Kline is a 50 y.o. male   Admitted to [facility] 9/25 with abdominal and back pain Hx renal cell Ca (partial Rt nephrectomy 2012) Hx Rectal Ca 2016 Colostomy and now hernia Surgical procedure for rectal ca with damage to urethra Has developed a leak with extravasation to pelvic cavity. Request for Bilat percutaneous nephrostomy tube placement for urinary diversion --Per Dr Diona Fanti Imaging reviewed with Dr Lanell Persons to move ahead   Past Medical History:  Diagnosis Date  . Anxiety   . Arthritis   . Depression   . Kidney stone 03/22/15   left  . Rectal cancer ([place])   . Rectal pain   . Renal cell cancer ([place])    2012.   [name] Shortness of breath dyspnea     Past Surgical History:  Procedure Laterality Date  . BIOPSY N/A 09/30/2014   Procedure: BIOPSY;  Surgeon: Danie Binder, MD;  Location: [facility];  Service: Endoscopy;  Laterality: N/A;  Anal Canal  . COLOSTOMY N/A 02/11/2015   Procedure: COLOSTOMY;  Surgeon: Aviva Signs, MD;  Location: [facility];  Service: General;  Laterality: N/A;  . FLEXIBLE SIGMOIDOSCOPY N/A 09/30/2014   Procedure: FLEXIBLE SIGMOIDOSCOPY;  Surgeon: Danie Binder, MD;  Location: [facility];  Service: Endoscopy;  Laterality: N/A;  . FLEXIBLE SIGMOIDOSCOPY N/A 10/17/2014   Procedure: FLEXIBLE SIGMOIDOSCOPY;  Surgeon: Danie Binder, MD;  Location: [facility] END[facility] SUITE;  Service: Endoscopy;  Laterality: N/A;  1325  . HYPOSPADIAS CORRECTION    . IR GENERIC HISTORICAL  12/17/2015   IR CATHETER TUBE CHANGE 12/17/2015 [facility]-INTERV RAD  . KNEE SURGERY Left    arthroscopy  . LAPAROSCOPIC PARTIAL NEPHRECTOMY  2012   right side  . LUNG BIOPSY Right   . PERINEAL PROCTECTOMY N/A  08/26/2015   Procedure:  PROCTECTOMY;  Surgeon: Aviva Signs, MD;  Location: [facility];  Service: General;  Laterality: N/A;  . RECTAL SURGERY      Allergies: Oxaliplatin  Medications: Prior to Admission medications   Medication Sig Start Date End Date Taking? Authorizing Provider  albuterol (PROVENTIL HFA;VENTOLIN HFA) 108 (90 Base) MCG/ACT inhaler Inhale 2 puffs into the lungs every 6 (six) hours as needed for wheezing or shortness of breath. 05/26/15   Patrici Ranks, MD  amitriptyline (ELAVIL) 50 MG tablet Take 1 tablet (50 mg total) by mouth at bedtime. 12/31/15   Patrici Ranks, MD  docusate sodium (COLACE) 100 MG capsule Take 1 capsule (100 mg total) by mouth every 12 (twelve) hours. Patient taking differently: Take 100 mg by mouth daily as needed for mild constipation or moderate constipation.  09/25/14   Dalia Heading, PA-C  Doxylamine Succinate, Sleep, (SLEEP AID PO) Take 1-2 tablets by mouth at bedtime. Includes Melatonin and Valerian Root    Historical Provider, MD  DULoxetine (CYMBALTA) 60 MG capsule Take 1 capsule (60 mg total) by mouth daily. 12/31/15   Patrici Ranks, MD  fentaNYL (DURAGESIC - DOSED MCG/HR) 75 MCG/HR Place 2 patches (150 mcg total) onto the skin every 3 (three) days. 12/31/15   Patrici Ranks, MD  HYDROmorphone (DILAUDID) 4 MG tablet Take one tablet by mouth every 4 hours for pain Patient not taking: Reported on 01/25/2016 11/26/15   Baird Cancer, PA-C  lidocaine-prilocaine (  EMLA) cream Apply a quarter size amount to port site 1 hour prior to chemo. Do not rub in. Cover with plastic wrap. Patient not taking: Reported on 01/25/2016 11/02/14   Patrici Ranks, MD  ondansetron (ZOFRAN ODT) 4 MG disintegrating tablet '4mg'$  ODT q4 hours prn nausea/vomit Patient taking differently: Take 4 mg by mouth every 4 (four) hours as needed for nausea or vomiting.  03/22/15   Merrily Pew, MD  predniSONE (STERAPRED UNI-PAK 21 TAB) 10 MG (21) TBPK tablet Take 1 tablet  (10 mg total) by mouth daily. Take 60 mg PO day 1, 50 mg day 2, 40 mg day 3, 30 mg day 4, 20 mg day 5, 10 mg day 6, STOP 01/19/16 02/18/16  Baird Cancer, PA-C  pregabalin (LYRICA) 75 MG capsule Take 2 capsules (150 mg total) by mouth 2 (two) times daily. 12/31/15   Patrici Ranks, MD  prochlorperazine (COMPAZINE) 10 MG tablet Take 1 tablet (10 mg total) by mouth every 6 (six) hours as needed for nausea or vomiting. 11/02/14   Patrici Ranks, MD  zolpidem (AMBIEN CR) 12.5 MG CR tablet Take 1 tablet (12.5 mg total) by mouth at bedtime as needed for sleep. 12/31/15   Patrici Ranks, MD  zolpidem (AMBIEN) 10 MG tablet Take 1 tablet (10 mg total) by mouth at bedtime. Patient not taking: Reported on 01/25/2016 09/29/15 12/31/15  Baird Cancer, PA-C     Family History  Problem Relation Age of Onset  . Healthy Mother   . Lung cancer Maternal Grandfather   . Colon cancer Maternal Uncle     age 35  . Diabetes Other     Social History   Social History  . Marital status: Divorced    Spouse name: N/A  . Number of children: 2  . Years of education: N/A   Occupational History  . retail    Social History Main Topics  . Smoking status: Former Smoker    Packs/day: 0.50    Years: 30.00    Types: Cigarettes  . Smokeless tobacco: Never Used     Comment: a little over a pack daily  . Alcohol use 0.0 oz/week     Comment: Occ  . Drug use: No  . Sexual activity: Yes    Birth control/ protection: None   Other Topics Concern  . None   Social History Narrative  . None     Review of Systems: A 12 point ROS discussed and pertinent positives are indicated in the HPI above.  All other systems are negative.  Review of Systems  Constitutional: Positive for activity change. Negative for appetite change and fatigue.  Respiratory: Negative for shortness of breath.   Gastrointestinal: Positive for abdominal pain.  Neurological: Negative for weakness.  Psychiatric/Behavioral: Negative for  behavioral problems and confusion.    Vital Signs: BP 124/77 (BP Location: Left Arm)   SpO2 97%   Physical Exam  Constitutional: He is oriented to person, place, and time.  Cardiovascular: Normal rate, regular rhythm and normal heart sounds.   Pulmonary/Chest: Effort normal and breath sounds normal.  Abdominal: Soft. Bowel sounds are normal. There is tenderness.  Left side colostomy ++ hernia at colostomy site Suprapubic catheter in place  Musculoskeletal: Normal range of motion.  Neurological: He is alert and oriented to person, place, and time.  Skin: Skin is warm and dry.  Psychiatric: He has a normal mood and affect. His behavior is normal. Judgment and thought content normal.  Nursing note and vitals reviewed.   Mallampati Score:  MD Evaluation Airway: WNL Heart: WNL Abdomen: WNL Chest/ Lungs: WNL ASA  Classification: 3 Mallampati/Airway Score: One  Imaging: Ct Abdomen Pelvis Wo Contrast  Result Date: 01/26/2016 CLINICAL DATA:  Bilateral flank pain for 3 days. Suprapubic catheter. Rectal cancer. Lung metastasis. EXAM: CT ABDOMEN AND PELVIS WITHOUT CONTRAST TECHNIQUE: Multidetector CT imaging of the abdomen and pelvis was performed following the standard protocol without IV contrast. COMPARISON:  CT 01/12/2016 FINDINGS: Lower chest: Bilateral lower lobe pulmonary nodules again demonstrated. Hepatobiliary: No focal hepatic lesions noncontrast exam. Normal gallbladder. Pancreas: Multiple splenic calcifications within the pancreatic parenchyma. No duct dilatation Spleen: Normal spleen. Adrenals/Urinary Tract: Adrenal glands normal. Dilatation of the RIGHT renal pelvis and mild RIGHT hydroureter. No obstructing lesion identified. LEFT ureter is normal. Suprapubic pubic catheter within the lumen of the bladder. Bladder is collapsed. In the presacral space, there is a rounded low-density fluid collection measuring 3.4 by 3.6 cm and not changed from comparison CT. Stomach/Bowel:  Stomach, small bowel appendix cecum normal. The long segment of small bowel enters a LEFT peristomal hernia without obstruction. This appears to be an abandoned ostomy. The colon and rectosigmoid colon are normal prior proctectomy. Vascular/Lymphatic: Abdominal aorta is normal caliber with atherosclerotic calcification. There is no retroperitoneal or periportal lymphadenopathy. No pelvic lymphadenopathy. Reproductive: Prostatectomy Other: No free fluid or free air Musculoskeletal: No aggressive osseous lesion. IMPRESSION: 1. No clear explanation for RIGHT-sided pain. No significant change from CT 2 weeks prior. 2. Mild RIGHT hydroureter and hydronephrosis is similar to comparison exam. 3. Suprapubic catheter within a collapsed bladder. 4. Stable rounded fluid collection in the presacral space. 5. Long segment of small bowel enters a LEFT lower quadrant peristomal hernia. No evidence of bowel obstruction. 6. Pulmonary metastasis. Electronically Signed   By: Suzy Bouchard M.D.   On: 01/26/2016 16:54   Ct Chest W Contrast  Result Date: 01/13/2016 CLINICAL DATA:  Rectal cancer diagnosed may 2016, rectal surgery (AP resection) in April 2017. Ureteral injury. Metastatic to lung. Discomfort and discharge. EXAM: CT CHEST, ABDOMEN, AND PELVIS WITH CONTRAST TECHNIQUE: Multidetector CT imaging of the chest, abdomen and pelvis was performed following the standard protocol during bolus administration of intravenous contrast. CONTRAST:  160m ISOVUE-300 IOPAMIDOL (ISOVUE-300) INJECTION 61% COMPARISON:  Multiple exams, including 11/04/2015 FINDINGS: CT CHEST FINDINGS Cardiovascular: Port-A-Cath tip:  Lower SVC. Mediastinum/Nodes: AP window lymph node 1.2 cm in short axis diameter, image 30/17, previously 0.9 cm. Lungs/Pleura: Scattered enlarging pulmonary nodules in the lungs, some of which are cavitary. An index thick-walled partially cavitary pulmonary nodule measures 2.3 by 2.0 cm on image 71/18, previously 2.0 by 1.6  cm. No new nodules identified although all of the pulmonary nodules appear enlarged. Musculoskeletal: Thoracic spondylosis. CT ABDOMEN PELVIS FINDINGS Hepatobiliary: No liver metastatic lesion identified. Gallbladder unremarkable. Pancreas: Speckled calcifications throughout the pancreatic parenchyma compatible chronic calcific pancreatitis. Spleen: Unremarkable Adrenals/Urinary Tract: Adrenal glands normal. Right hydronephrosis and hydroureter extending down to just past the iliac vessel cross over where the ureter is partially encased by presacral soft tissue density and roots to normal caliber. Delayed excretion from the right kidney. Scarring along the right kidney lower pole likely related to partial nephrectomy based on morphology. Partially duplicated left collecting system. No left hydronephrosis or hydroureter. A suprapubic catheter in the urinary bladder. On the 10 minutes delayed images, there is relatively little contrast in the urinary bladder. On the 25 minutes postvoid images there is a greater amount of contrast in  the urinary bladder. On the images labeled post void, which presumably is after opening of the clamp on the suprapubic catheter, bladder volume changes from initial measurement of 390 cm^3 to subsequent measurement of 110 cm^3. I do not see a definite leak in the urinary bladder. There does appear to be tethering, narrowing, or stricture of the distal right ureter just after crossing the iliac vessels. No leak of contrast from the ureters or urinary bladder. Stomach/Bowel: Left colostomy. Peristomal herniation of several loops of small bowel without strangulation or obstruction. Mild prominence of stool in the remaining colon. Appendix normal. AP resection noted. Presacral soft tissue density, with an internal 3.9 by 2.9 by 4.6 cm (volume = 27 cm^3) area of fluid density centrally in the presacral soft tissue density. This no longer demonstrates gas density internally and is smaller than  on the pre drainage CT. Based on image 102 of series 11, the possibility of this fluid collection communicating anteriorly to the vicinity of the prostatic urethra is raised ; the hypodensity of does appear to extend this far forward as several punctate calcifications that are in the central prostate gland. Reviewing the patient' s recent progress notes, the patient does have a brown penile discharge, which would further support the possibility of this connection extending all the way to the prostatic urethra. Vascular/Lymphatic: Aortoiliac atherosclerotic vascular disease. No pathologic adenopathy identified. Reproductive: As noted above, the presacral fluid collection may be communicating with the prostate gland/prostatic urethra. The presacral fluid collection is considerably smaller than on the pre drainage CT of 11/04/2015 and no longer contains gas. The rind of soft tissue density around this fluid collection appears thicker than it did on 11/04/2015 although this still might be ascribed to radiation therapy and inflammatory related findings, but merits careful observation. Tissue planes around the prostate gland are considerably indistinct. Other: No supplemental non-categorized findings. Musculoskeletal: Degenerative disc disease at L5-S1. IMPRESSION: 1. Enlarging bilateral pulmonary nodules compatible with progressive metastatic disease. Some of these nodules are cavitary. 2. Right hydronephrosis and hydroureter due to a distal stricture or regional wall thickening in the right distal ureter starting just past the iliac vessel cross over. This results in delayed excretion on the right, and considerable right hydronephrosis. In the area of involvement, the ureter does hurt the margin of the presacral soft tissue density. 3. Increase in presacral soft tissue density noted. Reduced size of the internal fluid collection within this soft tissue density, currently 27 cc. I suspect that this collection may be  extending anteriorly along its lower margin and possibly connecting to the prostatic urethra based on how for forwarded extends and based on the history of a brown discharge from the penis. The prior gas in this collection is no longer present but residual infection is not entirely excluded. The presacral soft tissue density does extend to the posterior bladder margin but I do not see any posterior leak of contrast from the urinary bladder or ureters on the delayed postvoid images. A suprapubic catheter is in place. 4. Left colostomy with peristomal hernia containing several loops of small bowel without complicating feature. Mild prominence of stool in the remaining colon. Electronically Signed   By: Van Clines M.D.   On: 01/13/2016 08:32   Ct Abdomen Pelvis W Contrast  Result Date: 01/13/2016 CLINICAL DATA:  Rectal cancer diagnosed may 2016, rectal surgery (AP resection) in April 2017. Ureteral injury. Metastatic to lung. Discomfort and discharge. EXAM: CT CHEST, ABDOMEN, AND PELVIS WITH CONTRAST TECHNIQUE: Multidetector CT  imaging of the chest, abdomen and pelvis was performed following the standard protocol during bolus administration of intravenous contrast. CONTRAST:  185m ISOVUE-300 IOPAMIDOL (ISOVUE-300) INJECTION 61% COMPARISON:  Multiple exams, including 11/04/2015 FINDINGS: CT CHEST FINDINGS Cardiovascular: Port-A-Cath tip:  Lower SVC. Mediastinum/Nodes: AP window lymph node 1.2 cm in short axis diameter, image 30/17, previously 0.9 cm. Lungs/Pleura: Scattered enlarging pulmonary nodules in the lungs, some of which are cavitary. An index thick-walled partially cavitary pulmonary nodule measures 2.3 by 2.0 cm on image 71/18, previously 2.0 by 1.6 cm. No new nodules identified although all of the pulmonary nodules appear enlarged. Musculoskeletal: Thoracic spondylosis. CT ABDOMEN PELVIS FINDINGS Hepatobiliary: No liver metastatic lesion identified. Gallbladder unremarkable. Pancreas: Speckled  calcifications throughout the pancreatic parenchyma compatible chronic calcific pancreatitis. Spleen: Unremarkable Adrenals/Urinary Tract: Adrenal glands normal. Right hydronephrosis and hydroureter extending down to just past the iliac vessel cross over where the ureter is partially encased by presacral soft tissue density and roots to normal caliber. Delayed excretion from the right kidney. Scarring along the right kidney lower pole likely related to partial nephrectomy based on morphology. Partially duplicated left collecting system. No left hydronephrosis or hydroureter. A suprapubic catheter in the urinary bladder. On the 10 minutes delayed images, there is relatively little contrast in the urinary bladder. On the 25 minutes postvoid images there is a greater amount of contrast in the urinary bladder. On the images labeled post void, which presumably is after opening of the clamp on the suprapubic catheter, bladder volume changes from initial measurement of 390 cm^3 to subsequent measurement of 110 cm^3. I do not see a definite leak in the urinary bladder. There does appear to be tethering, narrowing, or stricture of the distal right ureter just after crossing the iliac vessels. No leak of contrast from the ureters or urinary bladder. Stomach/Bowel: Left colostomy. Peristomal herniation of several loops of small bowel without strangulation or obstruction. Mild prominence of stool in the remaining colon. Appendix normal. AP resection noted. Presacral soft tissue density, with an internal 3.9 by 2.9 by 4.6 cm (volume = 27 cm^3) area of fluid density centrally in the presacral soft tissue density. This no longer demonstrates gas density internally and is smaller than on the pre drainage CT. Based on image 102 of series 11, the possibility of this fluid collection communicating anteriorly to the vicinity of the prostatic urethra is raised ; the hypodensity of does appear to extend this far forward as several  punctate calcifications that are in the central prostate gland. Reviewing the patient' s recent progress notes, the patient does have a brown penile discharge, which would further support the possibility of this connection extending all the way to the prostatic urethra. Vascular/Lymphatic: Aortoiliac atherosclerotic vascular disease. No pathologic adenopathy identified. Reproductive: As noted above, the presacral fluid collection may be communicating with the prostate gland/prostatic urethra. The presacral fluid collection is considerably smaller than on the pre drainage CT of 11/04/2015 and no longer contains gas. The rind of soft tissue density around this fluid collection appears thicker than it did on 11/04/2015 although this still might be ascribed to radiation therapy and inflammatory related findings, but merits careful observation. Tissue planes around the prostate gland are considerably indistinct. Other: No supplemental non-categorized findings. Musculoskeletal: Degenerative disc disease at L5-S1. IMPRESSION: 1. Enlarging bilateral pulmonary nodules compatible with progressive metastatic disease. Some of these nodules are cavitary. 2. Right hydronephrosis and hydroureter due to a distal stricture or regional wall thickening in the right distal ureter starting just  past the iliac vessel cross over. This results in delayed excretion on the right, and considerable right hydronephrosis. In the area of involvement, the ureter does hurt the margin of the presacral soft tissue density. 3. Increase in presacral soft tissue density noted. Reduced size of the internal fluid collection within this soft tissue density, currently 27 cc. I suspect that this collection may be extending anteriorly along its lower margin and possibly connecting to the prostatic urethra based on how for forwarded extends and based on the history of a brown discharge from the penis. The prior gas in this collection is no longer present but  residual infection is not entirely excluded. The presacral soft tissue density does extend to the posterior bladder margin but I do not see any posterior leak of contrast from the urinary bladder or ureters on the delayed postvoid images. A suprapubic catheter is in place. 4. Left colostomy with peristomal hernia containing several loops of small bowel without complicating feature. Mild prominence of stool in the remaining colon. Electronically Signed   By: Van Clines M.D.   On: 01/13/2016 08:32   Dg Chest Portable 1 View  Result Date: 01/25/2016 CLINICAL DATA:  Acute onset of bilateral flank pain. Low-grade fever. Initial encounter. EXAM: PORTABLE CHEST 1 VIEW COMPARISON:  CT of the chest performed 01/12/2016 FINDINGS: Vascular congestion is noted. Increased interstitial markings raise concern for mild interstitial edema. No pleural effusion or pneumothorax is seen. The cardiomediastinal silhouette is normal in size. No acute osseous abnormalities are seen. A right-sided chest port is noted ending about the mid SVC. IMPRESSION: Vascular congestion noted. Increased interstitial markings raise concern for mild interstitial edema. Electronically Signed   By: Garald Balding M.D.   On: 01/25/2016 21:30    Labs:  CBC:  Recent Labs  11/12/15 0945 01/25/16 1805 01/26/16 0546 01/27/16 0515  WBC 9.0 13.4* 6.2 5.8  HGB 12.3* 13.7 11.0* 12.1*  HCT 38.0* 42.8 36.2* 38.6*  PLT 399 440* 357 343    COAGS:  Recent Labs  09/23/15 0900 11/12/15 0945 01/26/16 1540  INR 1.15 1.12 1.00  APTT 38*  --   --     BMP:  Recent Labs  11/06/15 1245 01/25/16 1805 01/26/16 0546 01/27/16 0515  NA 132* 136 138 137  K 3.6 4.0 3.8 3.7  CL 99* 103 105 103  CO2 '28 24 27 29  '$ GLUCOSE 102* 124* 133* 105*  BUN <5* '9 9 10  '$ CALCIUM 8.0* 8.7* 7.9* 8.3*  CREATININE 0.57* 0.99 0.98 0.83  GFRNONAA >60 >60 >60 >60  GFRAA >60 >60 >60 >60    LIVER FUNCTION TESTS:  Recent Labs  09/08/15 0903  11/06/15 1245 01/25/16 1805 01/26/16 0546  BILITOT 0.4 0.5 0.5 0.4  AST 31 14* 19 14*  ALT 35 15* 19 14*  ALKPHOS 183* 101 117 89  PROT 7.8 6.6 7.7 6.2*  ALBUMIN 2.8* 2.9* 3.6 2.8*    TUMOR MARKERS:  Recent Labs  07/21/15 1000 08/18/15 0835 09/08/15 0903 11/06/15 1245  CEA 3.4 3.2 2.1 3.9    Assessment and Plan:  Urinary leak post rectal ca surgery---damage to urethra Now with pelvic fluid collection Request for diversion percutaneous nephrostomies Risks and Benefits discussed with the patient including, but not limited to infection, bleeding, significant bleeding causing loss or decrease in renal function or damage to adjacent structures.  All of the patient's questions were answered, patient is agreeable to proceed. Consent signed and in chart.  Thank you for this interesting  consult.  I greatly enjoyed meeting Keevin Panebianco and look forward to participating in their care.  A copy of this report was sent to the requesting provider on this date.  Electronically Signed: Monia Sabal A 01/27/2016, 9:18 AM   I spent a total of 40 Minutes    in face to face in clinical consultation, greater than 50% of which was counseling/coordinating care for B PCN

## 2016-01-27 NOTE — Sedation Documentation (Addendum)
Patient is resting comfortably. 

## 2016-01-27 NOTE — Sedation Documentation (Signed)
Is moaning withe Dr Pascal Lux use of pressure with ultrasound

## 2016-01-27 NOTE — Procedures (Signed)
Successful [place] and fluoroscopic guided placement of bilateral PCNs with end coiled and locked within the renal pelvis. PCNs connected to gravity bag. EBL: None No immediate post procedural complications.   Ronny Bacon, MD Pager #: [phone]

## 2016-01-27 NOTE — Sedation Documentation (Addendum)
Spoke to Dr Pascal Lux re patient pain. Is on [facility] stretcher ready to go back to [facility]. See orders. Report was given to [facility] providers. Dr Pascal Lux was in to talk to patient.

## 2016-01-28 ENCOUNTER — Encounter (HOSPITAL_COMMUNITY): Payer: Medicaid Other

## 2016-01-28 NOTE — Care Management Obs Status (Deleted)
[place] NOTIFICATION   Patient Details  Name: Broox Lonigro MRN: [id number] Date of Birth: [birth year]   Medicare Observation Status Notification Given:  Yes    Sherald Barge, RN 01/28/2016, 10:19 AM

## 2016-01-28 NOTE — Progress Notes (Signed)
PROGRESS NOTE    Andrew Kline  TSV:[id number] DOB: 10-27-1965 DOA: 01/25/2016 PCP: [name], MD    Brief Narrative: 50 y/o man admitted from home on 9/25 with abdominal and back pain. Has a complicated GU/rectal anatomy due to h/o rectal cancer with damage to urethra during surgery. He has developed a leak with probable extravasation into the pelvic cavity. Has a suprapubic catheter at present. Believed to have a complicated UTI.  He has been on Cefepime, and urology has seen him. He had bilateral PCN placed on Sept 27, 2017.  He is on Cefepime as his UA showed TNTC WBC, but culture was negative.   Assessment & Plan:   Active Problems:   Rectal carcinoma (HCC)   UTI (lower urinary tract infection)   Complicated UTI (urinary tract infection)  Complicated UTI -Agree with broad spectrum abx pending cx data. -GU following. S/p bilateral PCN Sept 27. Urine culture NG.  IV Cefepime day 3.   Metastatic Rectal Cancer -s/p surgery. -Follow up with oncology as an OP.  DVT prophylaxis:  Lovenox Code Status: FULL CODE (reconfirmed by me today) Family Communication: None. Disposition Plan: likely to home when appropriate.   Consultants:   Urology.   Procedures:   Diverting nephrostomy tube bilaterally.     Antimicrobials: Anti-infectives    Start     Dose/Rate Route Frequency Ordered Stop   01/26/16 0600  ceFEPIme (MAXIPIME) 1 g in dextrose 5 % 50 mL IVPB     1 g 100 mL/hr over 30 Minutes Intravenous Every 8 hours 01/25/16 2017     01/25/16 2000  ceFEPIme (MAXIPIME) 2 g in dextrose 5 % 50 mL IVPB     2 g 100 mL/hr over 30 Minutes Intravenous  Once 01/25/16 1952 01/25/16 2121       Subjective:  Doing well.  No complaints.  Back pain improved.   Objective: Vitals:   01/27/16 1342 01/27/16 2057 01/27/16 2109 01/28/16 0557  BP: 122/74  127/68 120/70  Pulse: 87  78 84  Resp: '12  14 14  '$ Temp: 98.3 F (36.8 C)  97.9 F (36.6 C) 98.8 F (37.1 C)    TempSrc: Oral  Oral Oral  SpO2: 97% 92% 93% 94%  Weight:      Height:        Intake/Output Summary (Last 24 hours) at 01/28/16 0825 Last data filed at 01/28/16 0600  Gross per 24 hour  Intake          3278.75 ml  Output             3580 ml  Net          -301.25 ml   Filed Weights   01/25/16 1722 01/25/16 2344  Weight: 87.5 kg (193 lb) 87.5 kg (192 lb 12.8 oz)    Examination:  General exam: Appears calm and comfortable  Respiratory system: Clear to auscultation. Respiratory effort normal. Cardiovascular system: S1 & S2 heard, RRR. No JVD, murmurs, rubs, gallops or clicks. No pedal edema. Gastrointestinal system: Abdomen is nondistended, soft and nontender. No organomegaly or masses felt. Normal bowel sounds heard. Central nervous system: Alert and oriented. No focal neurological deficits. Extremities: Symmetric 5 x 5 power. Skin: No rashes, lesions or ulcers Psychiatry: Judgement and insight appear normal. Mood & affect appropriate.   Data Reviewed: I have personally reviewed following labs and imaging studies  CBC:  Recent Labs Lab 01/25/16 1805 01/26/16 0546 01/27/16 0515  WBC 13.4* 6.2 5.8  HGB 13.7 11.0*  12.1*  HCT 42.8 36.2* 38.6*  MCV 83.4 85.8 85.6  PLT 440* 357 638   Basic Metabolic Panel:  Recent Labs Lab 01/25/16 1805 01/26/16 0546 01/27/16 0515  NA 136 138 137  K 4.0 3.8 3.7  CL 103 105 103  CO2 '24 27 29  '$ GLUCOSE 124* 133* 105*  BUN '9 9 10  '$ CREATININE 0.99 0.98 0.83  CALCIUM 8.7* 7.9* 8.3*   GFR: Estimated Creatinine Clearance: 107.9 mL/min (by C-G formula based on SCr of 0.83 mg/dL). Liver Function Tests:  Recent Labs Lab 01/25/16 1805 01/26/16 0546  AST 19 14*  ALT 19 14*  ALKPHOS 117 89  BILITOT 0.5 0.4  PROT 7.7 6.2*  ALBUMIN 3.6 2.8*    Recent Labs Lab 01/25/16 1805  LIPASE 20   No results for input(s): AMMONIA in the last 168 hours. Coagulation Profile:  Recent Labs Lab 01/26/16 1540  INR 1.00    Recent  Labs Lab 01/25/16 1805 01/25/16 2038  LATICACIDVEN 1.8 1.1    Recent Results (from the past 240 hour(s))  Urine culture     Status: None (Preliminary result)   Collection Time: 01/25/16  7:49 PM  Result Value Ref Range Status   Specimen Description URINE, SUPRAPUBIC  Final   Special Requests NONE  Final   Culture   Final    CULTURE REINCUBATED FOR BETTER GROWTH Performed at [facility]    Report Status PENDING  Incomplete  Blood culture (routine x 2)     Status: None (Preliminary result)   Collection Time: 01/25/16  8:20 PM  Result Value Ref Range Status   Specimen Description BLOOD RIGHT ANTECUBITAL DRAWN BY RN  Final   Special Requests BOTTLES DRAWN AEROBIC AND ANAEROBIC 6CC  Final   Culture NO GROWTH 2 DAYS  Final   Report Status PENDING  Incomplete  Blood culture (routine x 2)     Status: None (Preliminary result)   Collection Time: 01/25/16  8:38 PM  Result Value Ref Range Status   Specimen Description BLOOD LEFT ANTECUBITAL  Final   Special Requests BOTTLES DRAWN AEROBIC AND ANAEROBIC 6CC  Final   Culture NO GROWTH 2 DAYS  Final   Report Status PENDING  Incomplete     Radiology Studies: Ct Abdomen Pelvis Wo Contrast  Result Date: 01/26/2016 CLINICAL DATA:  Bilateral flank pain for 3 days. Suprapubic catheter. Rectal cancer. Lung metastasis. EXAM: CT ABDOMEN AND PELVIS WITHOUT CONTRAST TECHNIQUE: Multidetector CT imaging of the abdomen and pelvis was performed following the standard protocol without IV contrast. COMPARISON:  CT 01/12/2016 FINDINGS: Lower chest: Bilateral lower lobe pulmonary nodules again demonstrated. Hepatobiliary: No focal hepatic lesions noncontrast exam. Normal gallbladder. Pancreas: Multiple splenic calcifications within the pancreatic parenchyma. No duct dilatation Spleen: Normal spleen. Adrenals/Urinary Tract: Adrenal glands normal. Dilatation of the RIGHT renal pelvis and mild RIGHT hydroureter. No obstructing lesion identified. LEFT ureter  is normal. Suprapubic pubic catheter within the lumen of the bladder. Bladder is collapsed. In the presacral space, there is a rounded low-density fluid collection measuring 3.4 by 3.6 cm and not changed from comparison CT. Stomach/Bowel: Stomach, small bowel appendix cecum normal. The long segment of small bowel enters a LEFT peristomal hernia without obstruction. This appears to be an abandoned ostomy. The colon and rectosigmoid colon are normal prior proctectomy. Vascular/Lymphatic: Abdominal aorta is normal caliber with atherosclerotic calcification. There is no retroperitoneal or periportal lymphadenopathy. No pelvic lymphadenopathy. Reproductive: Prostatectomy Other: No free fluid or free air Musculoskeletal: No aggressive osseous  lesion. IMPRESSION: 1. No clear explanation for RIGHT-sided pain. No significant change from CT 2 weeks prior. 2. Mild RIGHT hydroureter and hydronephrosis is similar to comparison exam. 3. Suprapubic catheter within a collapsed bladder. 4. Stable rounded fluid collection in the presacral space. 5. Long segment of small bowel enters a LEFT lower quadrant peristomal hernia. No evidence of bowel obstruction. 6. Pulmonary metastasis. Electronically Signed   By: Suzy Bouchard M.D.   On: 01/26/2016 16:54   Ir Nephrostomy Placement Left  Result Date: 01/27/2016 INDICATION: History of rectal cancer, post APR with urinary bladder injury, post suprapubic catheter placement. Concern for persistent urinary leak and as such request made for placement of bilateral percutaneous nephrostomy catheters for urinary diversion purposes. EXAM: 1. ULTRASOUND GUIDANCE FOR PUNCTURE OF THE LEFT/RIGHT RENAL COLLECTING SYSTEM 2. LEFT/RIGHT PERCUTANEOUS NEPHROSTOMY TUBE PLACEMENT. COMPARISON:  CT the abdomen pelvis - 01/26/2016; 01/12/2016; ultrasound and CT-guided suprapubic catheter placement - 09/23/2015 MEDICATIONS: The patient currently admitted to the hospital and receiving intravenous  antibiotics;The antibiotic was administered in an appropriate time frame prior to skin puncture. ANESTHESIA/SEDATION: Fentanyl 150 mcg IV; Versed 3.5 mg IV Total Moderate Sedation Time 35 minutes. CONTRAST:  A total of 20 mL Isovue 300 administered into the bilateral collecting systems FLUOROSCOPY TIME:  3 minutes 18 seconds (604 mGy) COMPLICATIONS: None immediate. PROCEDURE: The procedure, risks, benefits, and alternatives were explained to the patient. Questions regarding the procedure were encouraged and answered. The patient understands and consents to the procedure. A timeout was performed prior to the initiation of the procedure. The bilateral flanks were prepped with Betadine in a sterile fashion, and a sterile drape was applied covering the operative field. A sterile gown and sterile gloves were used for the procedure. Local anesthesia was provided with 1% Lidocaine with epinephrine. Ultrasound was used to localize the right kidney. Under direct ultrasound guidance, a 21 gauge needle was advanced into the renal collecting system. An ultrasound image documentation was performed. Access within the collecting system was confirmed with the efflux of urine followed by contrast injection. Over a Nitrex wire, the inner three [place] catheter of an Accustick set was advanced into the renal collecting system. Contrast injection was injected into the collecting system as several spot radiographs were obtained in various obliquities confirming puncture within a posterior inferior calix. As such, the tract was dilated with an Accustick stent. Over a guide wire, a 10-French percutaneous nephrostomy catheter was advanced into the collecting system where the coil was formed and locked. Contrast was injected and several sport radiographs were obtained in various obliquities confirming access. The catheter was secured at the skin with a Prolene retention suture and a gravity bag was placed. Attention was now paid towards  placement of a left-sided percutaneous nephrostomy catheter. Local anesthesia was provided with 1% Lidocaine with epinephrine. Ultrasound was used to localize the left kidney. Under direct ultrasound guidance, a 21 gauge needle was advanced into the renal collecting system. An ultrasound image documentation was performed. Access within the collecting system was confirmed with the efflux of urine followed by contrast injection. Over a Nitrex wire, the inner three [place] catheter of an Accustick set was advanced into the renal collecting system. Contrast injection was injected into the collecting system as several spot radiographs were obtained in various obliquities confirming puncture within a posterior inferior calix. As such, the tract was dilated with an Accustick stent. Over a guide wire, a 10-French percutaneous nephrostomy catheter was advanced into the collecting system where the coil was  formed and locked. Contrast was injected and several sport radiographs were obtained in various obliquities confirming access. The catheter was secured at the skin with a Prolene retention suture and a gravity bag was placed. A dressing was placed. The patient tolerated procedure well without immediate postprocedural complication. FINDINGS: Ultrasound scanning demonstrates a mildly dilated right-sided renal collecting system with no significant dilatation of the left renal collecting system compatible the findings seen on preceding noncontrast abdominal CT. Under direct ultrasound guidance, a posterior inferior calix was targeted bilaterally allowing advancement of a 10-French percutaneous nephrostomy catheters bilaterally under intermittent fluoroscopic guidance. Contrast injection confirmed appropriate positioning. 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 IMPRESSION: 1. Successful ultrasound  fluoroscopic guided placement of bilateral 10 French percutaneous nephrostomy catheters. 2. 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 PLAN: If patient is still having a significant output from his suprapubic catheter, this may be secondary to the superior moiety of the left renal collecting system and as such, the left-sided percutaneous nephrostomy catheter may be manipulated into the superior aspect of the left ureter to better drain both left-sided renal moieties. Electronically Signed   By: Sandi Mariscal M.D.   On: 01/27/2016 14:15   Ir Nephrostomy Placement Right  Result Date: 01/27/2016 INDICATION: History of rectal cancer, post APR with urinary bladder injury, post suprapubic catheter placement. Concern for persistent urinary leak and as such request made for placement of bilateral percutaneous nephrostomy catheters for urinary diversion purposes. EXAM: 1. ULTRASOUND GUIDANCE FOR PUNCTURE OF THE LEFT/RIGHT RENAL COLLECTING SYSTEM 2. LEFT/RIGHT PERCUTANEOUS NEPHROSTOMY TUBE PLACEMENT. COMPARISON:  CT the abdomen pelvis - 01/26/2016; 01/12/2016; ultrasound and CT-guided suprapubic catheter placement - 09/23/2015 MEDICATIONS: The patient currently admitted to the hospital and receiving intravenous antibiotics;The antibiotic was administered in an appropriate time frame prior to skin puncture. ANESTHESIA/SEDATION: Fentanyl 150 mcg IV; Versed 3.5 mg IV Total Moderate Sedation Time 35 minutes. CONTRAST:  A total of 20 mL Isovue 300 administered into the bilateral collecting systems FLUOROSCOPY TIME:  3 minutes 18 seconds (119 mGy) COMPLICATIONS: None immediate. PROCEDURE: The procedure, risks, benefits, and alternatives were explained to the patient. Questions regarding the procedure were encouraged and answered. The patient understands and consents to the procedure. A timeout was performed prior to the initiation of the procedure. The bilateral flanks were prepped with Betadine in  a sterile fashion, and a sterile drape was applied covering the operative field. A sterile gown and sterile gloves were used for the procedure. Local anesthesia was provided with 1% Lidocaine with epinephrine. Ultrasound was used to localize the right kidney. Under direct ultrasound guidance, a 21 gauge needle was advanced into the renal collecting system. An ultrasound image documentation was performed. Access within the collecting system was confirmed with the efflux of urine followed by contrast injection. Over a Nitrex wire, the inner three [place] catheter of an Accustick set was advanced into the renal collecting system. Contrast injection was injected into the collecting system as several spot radiographs were obtained in various obliquities confirming puncture within a posterior inferior calix. As such, the tract was dilated with an Accustick stent. Over a guide wire, a 10-French percutaneous nephrostomy catheter was advanced into the collecting system where the coil was formed and locked. Contrast was injected and several sport radiographs were obtained in various obliquities confirming access. The catheter was secured at the skin with a Prolene retention suture and  a gravity bag was placed. Attention was now paid towards placement of a left-sided percutaneous nephrostomy catheter. Local anesthesia was provided with 1% Lidocaine with epinephrine. Ultrasound was used to localize the left kidney. Under direct ultrasound guidance, a 21 gauge needle was advanced into the renal collecting system. An ultrasound image documentation was performed. Access within the collecting system was confirmed with the efflux of urine followed by contrast injection. Over a Nitrex wire, the inner three [place] catheter of an Accustick set was advanced into the renal collecting system. Contrast injection was injected into the collecting system as several spot radiographs were obtained in various obliquities confirming puncture  within a posterior inferior calix. As such, the tract was dilated with an Accustick stent. Over a guide wire, a 10-French percutaneous nephrostomy catheter was advanced into the collecting system where the coil was formed and locked. Contrast was injected and several sport radiographs were obtained in various obliquities confirming access. The catheter was secured at the skin with a Prolene retention suture and a gravity bag was placed. A dressing was placed. The patient tolerated procedure well without immediate postprocedural complication. FINDINGS: Ultrasound scanning demonstrates a mildly dilated right-sided renal collecting system with no significant dilatation of the left renal collecting system compatible the findings seen on preceding noncontrast abdominal CT. Under direct ultrasound guidance, a posterior inferior calix was targeted bilaterally allowing advancement of a 10-French percutaneous nephrostomy catheters bilaterally under intermittent fluoroscopic guidance. Contrast injection confirmed appropriate positioning. 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 IMPRESSION: 1. Successful ultrasound fluoroscopic guided placement of bilateral 10 French percutaneous nephrostomy catheters. 2. 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 PLAN: If patient is still having a significant output from his suprapubic catheter, this may be secondary to the superior moiety of the left renal collecting system and as such, the left-sided percutaneous nephrostomy catheter may be manipulated into the superior aspect of the left ureter to better drain both left-sided renal moieties. Electronically Signed   By: Sandi Mariscal M.D.   On: 01/27/2016 14:15    Scheduled Meds: . amitriptyline  50 mg Oral QHS  . ceFEPime (MAXIPIME) IV  1 g Intravenous Q8H  . DULoxetine  60  mg Oral Daily  . heparin subcutaneous  5,000 Units Subcutaneous Q8H  . pregabalin  150 mg Oral BID  . zolpidem  10 mg Oral QHS   Continuous Infusions: . sodium chloride 75 mL/hr at 01/27/16 2339     LOS: 2 days   Raygan Skarda, MD [facility].   If 7PM-7AM, please contact night-coverage [email or web address] Password [email or web address] 01/28/2016, 8:25 AM

## 2016-01-28 NOTE — Progress Notes (Signed)
Pharmacy Antibiotic Note  Andrew Kline is a 50 y.o. male admitted on 01/25/2016 with UTI.  Pharmacy has been consulted for Cefepime dosing. GNR in urine culture  Plan: Cefepime 1gm IV every 8 hours. Monitor labs, micro and vitals.   Height: '5\' 6"'$  (167.6 cm) Weight: 192 lb 12.8 oz (87.5 kg) IBW/kg (Calculated) : 63.8  Temp (24hrs), Avg:98.2 F (36.8 C), Min:97.6 F (36.4 C), Max:98.8 F (37.1 C)   Recent Labs Lab 01/25/16 1805 01/25/16 2038 01/26/16 0546 01/27/16 0515  WBC 13.4*  --  6.2 5.8  CREATININE 0.99  --  0.98 0.83  LATICACIDVEN 1.8 1.1  --   --     Estimated Creatinine Clearance: 107.9 mL/min (by C-G formula based on SCr of 0.83 mg/dL).    Allergies  Allergen Reactions  . Oxaliplatin Itching   Antimicrobials this admission:  Cefepime 9/25 >>   Dose adjustments this admission:  n/a   Microbiology results: 9/25  UC:  gnr 9/25  BC: ngtd  Thank you for allowing pharmacy to be a part of this patient's care.  Excell Seltzer Poteet 01/28/2016 10:32 AM

## 2016-01-29 ENCOUNTER — Telehealth (HOSPITAL_COMMUNITY): Payer: Self-pay | Admitting: *Deleted

## 2016-01-29 LAB — URINE CULTURE

## 2016-01-29 MED ORDER — CIPROFLOXACIN HCL 250 MG PO TABS
500.0000 mg | ORAL_TABLET | Freq: Two times a day (BID) | ORAL | Status: DC
Start: 2016-01-29 — End: 2016-01-31
  Administered 2016-01-29 – 2016-01-31 (×5): 500 mg via ORAL
  Filled 2016-01-29 (×5): qty 2

## 2016-01-29 MED ORDER — HYDROMORPHONE HCL 2 MG PO TABS
2.0000 mg | ORAL_TABLET | ORAL | Status: DC | PRN
  Administered 2016-01-29 – 2016-01-31 (×10): 2 mg via ORAL
  Filled 2016-01-29 (×11): qty 1

## 2016-01-29 NOTE — Telephone Encounter (Signed)
Called patient her in his room, iformed him that he has a chemo appointment Tuesday and we would do labs and then see if we can proceed.

## 2016-01-29 NOTE — Progress Notes (Signed)
Notified Telemetry, MD ordered to discontinue telemetry. Removed pt's heart monitor.

## 2016-01-29 NOTE — Care Management Note (Signed)
Case Management Note  Patient Details  Name: Andrew Kline MRN: [id number] Date of Birth: 1965-10-19   Expected Discharge Date:  01/27/16               Expected Discharge Plan:  Home/Self Care  In-House Referral:  NA  Discharge planning Services  CM Consult  Post Acute Care Choice:  NA Choice offered to:  NA  DME Arranged:    DME Agency:     HH Arranged:    [place] Agency:     Status of Service:  Completed, signed off  If discussed at [employer] of Stay Meetings, dates discussed:    Additional Comments: Pt again offered [facility] nursing for drain care and pt is not interested, he feels comfortable caring for drains at home himself.   Sherald Barge, RN 01/29/2016, 1:08 PM

## 2016-01-29 NOTE — Progress Notes (Signed)
PROGRESS NOTE    Andrew Kline  NUU:[id number] DOB: 1965/11/12 DOA: 01/25/2016 PCP: [name], MD    Brief Narrative: 50 y/o man admitted from home on 9/25 with abdominal and back pain. Has a complicated GU/rectal anatomy due to h/o rectal cancer with damage to urethra during surgery. He has developed a leak with probable extravasation into the pelvic cavity. Has a suprapubic catheter at present. Believed to have a complicated UTI. He has been on Cefepime, and urology has seen him. He had bilateral PCN placed on Sept 27, 2017.  He is on Cefepime as his UA showed TNTC WBC, but culture was negative.   Assessment & Plan:   Active Problems:   Rectal carcinoma (HCC)   UTI (lower urinary tract infection)   Complicated UTI (urinary tract infection)  Complicated UTI -Agree with broad spectrum abx pending cx data. -GU following. S/p bilateral PCN Sept 27. Urine culture NG.  IV Cefepime day 4.  Will change to oral Cipro today. Plan d/c tomorrow if OK with urology.   Metastatic Rectal Cancer -s/p surgery. -Follow up with oncology as an OP.  DVT prophylaxis: Lovenox Code Status:FULL CODE (reconfirmed by me today) Family Communication:None. Disposition Plan:likely to home when appropriate.   Consultants:  Urology.     Antimicrobials: Anti-infectives    Start     Dose/Rate Route Frequency Ordered Stop   01/29/16 0845  ciprofloxacin (CIPRO) tablet 500 mg     500 mg Oral 2 times daily 01/29/16 0834     01/26/16 0600  ceFEPIme (MAXIPIME) 1 g in dextrose 5 % 50 mL IVPB     1 g 100 mL/hr over 30 Minutes Intravenous Every 8 hours 01/25/16 2017     01/25/16 2000  ceFEPIme (MAXIPIME) 2 g in dextrose 5 % 50 mL IVPB     2 g 100 mL/hr over 30 Minutes Intravenous  Once 01/25/16 1952 01/25/16 2121       Subjective:  Feeling better.  Requesting more narcotics.    Objective: Vitals:   01/28/16 1327 01/28/16 1932 01/28/16 2002 01/29/16 0658  BP: 121/60  131/75  135/88  Pulse: 74  76 71  Resp: '20  19 18  '$ Temp: 98.8 F (37.1 C)  98.4 F (36.9 C) 98 F (36.7 C)  TempSrc: Oral  Oral Oral  SpO2: 94% 94% 97% 94%  Weight:      Height:        Intake/Output Summary (Last 24 hours) at 01/29/16 0847 Last data filed at 01/29/16 0700  Gross per 24 hour  Intake             2385 ml  Output             1500 ml  Net              885 ml   Filed Weights   01/25/16 1722 01/25/16 2344  Weight: 87.5 kg (193 lb) 87.5 kg (192 lb 12.8 oz)    Examination:  General exam: Appears calm and comfortable  Respiratory system: Clear to auscultation. Respiratory effort normal. Cardiovascular system: S1 & S2 heard, RRR. No JVD, murmurs, rubs, gallops or clicks. No pedal edema. Gastrointestinal system: Abdomen is nondistended, soft and nontender. No organomegaly or masses felt. Normal bowel sounds heard.  Bilateral PCN.  Central nervous system: Alert and oriented. No focal neurological deficits. Extremities: Symmetric 5 x 5 power. Skin: No rashes, lesions or ulcers Psychiatry: Judgement and insight appear normal. Mood & affect appropriate.   Data  Reviewed: I have personally reviewed following labs and imaging studies  CBC:  Recent Labs Lab 01/25/16 1805 01/26/16 0546 01/27/16 0515  WBC 13.4* 6.2 5.8  HGB 13.7 11.0* 12.1*  HCT 42.8 36.2* 38.6*  MCV 83.4 85.8 85.6  PLT 440* 357 902   Basic Metabolic Panel:  Recent Labs Lab 01/25/16 1805 01/26/16 0546 01/27/16 0515  NA 136 138 137  K 4.0 3.8 3.7  CL 103 105 103  CO2 '24 27 29  '$ GLUCOSE 124* 133* 105*  BUN '9 9 10  '$ CREATININE 0.99 0.98 0.83  CALCIUM 8.7* 7.9* 8.3*   GFR: Estimated Creatinine Clearance: 107.9 mL/min (by C-G formula based on SCr of 0.83 mg/dL). Liver Function Tests:  Recent Labs Lab 01/25/16 1805 01/26/16 0546  AST 19 14*  ALT 19 14*  ALKPHOS 117 89  BILITOT 0.5 0.4  PROT 7.7 6.2*  ALBUMIN 3.6 2.8*    Recent Labs Lab 01/25/16 1805  LIPASE 20   Coagulation  Profile:  Recent Labs Lab 01/26/16 1540  INR 1.00   Sepsis Labs:  Recent Labs Lab 01/25/16 1805 01/25/16 2038  LATICACIDVEN 1.8 1.1    Recent Results (from the past 240 hour(s))  Urine culture     Status: Abnormal (Preliminary result)   Collection Time: 01/25/16  7:49 PM  Result Value Ref Range Status   Specimen Description URINE, SUPRAPUBIC  Final   Special Requests NONE  Final   Culture (A)  Final    >=100,000 COLONIES/mL KLEBSIELLA PNEUMONIAE SUSCEPTIBILITIES TO FOLLOW Performed at [facility]    Report Status PENDING  Incomplete  Blood culture (routine x 2)     Status: None (Preliminary result)   Collection Time: 01/25/16  8:20 PM  Result Value Ref Range Status   Specimen Description BLOOD RIGHT ANTECUBITAL DRAWN BY RN  Final   Special Requests BOTTLES DRAWN AEROBIC AND ANAEROBIC 6CC  Final   Culture NO GROWTH 3 DAYS  Final   Report Status PENDING  Incomplete  Blood culture (routine x 2)     Status: None (Preliminary result)   Collection Time: 01/25/16  8:38 PM  Result Value Ref Range Status   Specimen Description BLOOD LEFT ANTECUBITAL  Final   Special Requests BOTTLES DRAWN AEROBIC AND ANAEROBIC 6CC  Final   Culture NO GROWTH 3 DAYS  Final   Report Status PENDING  Incomplete     Radiology Studies: Ir Nephrostomy Placement Left  Result Date: 01/27/2016 INDICATION: History of rectal cancer, post APR with urinary bladder injury, post suprapubic catheter placement. Concern for persistent urinary leak and as such request made for placement of bilateral percutaneous nephrostomy catheters for urinary diversion purposes. EXAM: 1. ULTRASOUND GUIDANCE FOR PUNCTURE OF THE LEFT/RIGHT RENAL COLLECTING SYSTEM 2. LEFT/RIGHT PERCUTANEOUS NEPHROSTOMY TUBE PLACEMENT. COMPARISON:  CT the abdomen pelvis - 01/26/2016; 01/12/2016; ultrasound and CT-guided suprapubic catheter placement - 09/23/2015 MEDICATIONS: The patient currently admitted to the hospital and receiving  intravenous antibiotics;The antibiotic was administered in an appropriate time frame prior to skin puncture. ANESTHESIA/SEDATION: Fentanyl 150 mcg IV; Versed 3.5 mg IV Total Moderate Sedation Time 35 minutes. CONTRAST:  A total of 20 mL Isovue 300 administered into the bilateral collecting systems FLUOROSCOPY TIME:  3 minutes 18 seconds (409 mGy) COMPLICATIONS: None immediate. PROCEDURE: The procedure, risks, benefits, and alternatives were explained to the patient. Questions regarding the procedure were encouraged and answered. The patient understands and consents to the procedure. A timeout was performed prior to the initiation of the procedure. The bilateral  flanks were prepped with Betadine in a sterile fashion, and a sterile drape was applied covering the operative field. A sterile gown and sterile gloves were used for the procedure. Local anesthesia was provided with 1% Lidocaine with epinephrine. Ultrasound was used to localize the right kidney. Under direct ultrasound guidance, a 21 gauge needle was advanced into the renal collecting system. An ultrasound image documentation was performed. Access within the collecting system was confirmed with the efflux of urine followed by contrast injection. Over a Nitrex wire, the inner three [place] catheter of an Accustick set was advanced into the renal collecting system. Contrast injection was injected into the collecting system as several spot radiographs were obtained in various obliquities confirming puncture within a posterior inferior calix. As such, the tract was dilated with an Accustick stent. Over a guide wire, a 10-French percutaneous nephrostomy catheter was advanced into the collecting system where the coil was formed and locked. Contrast was injected and several sport radiographs were obtained in various obliquities confirming access. The catheter was secured at the skin with a Prolene retention suture and a gravity bag was placed. Attention was now paid  towards placement of a left-sided percutaneous nephrostomy catheter. Local anesthesia was provided with 1% Lidocaine with epinephrine. Ultrasound was used to localize the left kidney. Under direct ultrasound guidance, a 21 gauge needle was advanced into the renal collecting system. An ultrasound image documentation was performed. Access within the collecting system was confirmed with the efflux of urine followed by contrast injection. Over a Nitrex wire, the inner three [place] catheter of an Accustick set was advanced into the renal collecting system. Contrast injection was injected into the collecting system as several spot radiographs were obtained in various obliquities confirming puncture within a posterior inferior calix. As such, the tract was dilated with an Accustick stent. Over a guide wire, a 10-French percutaneous nephrostomy catheter was advanced into the collecting system where the coil was formed and locked. Contrast was injected and several sport radiographs were obtained in various obliquities confirming access. The catheter was secured at the skin with a Prolene retention suture and a gravity bag was placed. A dressing was placed. The patient tolerated procedure well without immediate postprocedural complication. FINDINGS: Ultrasound scanning demonstrates a mildly dilated right-sided renal collecting system with no significant dilatation of the left renal collecting system compatible the findings seen on preceding noncontrast abdominal CT. Under direct ultrasound guidance, a posterior inferior calix was targeted bilaterally allowing advancement of a 10-French percutaneous nephrostomy catheters bilaterally under intermittent fluoroscopic guidance. Contrast injection confirmed appropriate positioning. 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 IMPRESSION: 1. Successful  ultrasound fluoroscopic guided placement of bilateral 10 French percutaneous nephrostomy catheters. 2. 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 PLAN: If patient is still having a significant output from his suprapubic catheter, this may be secondary to the superior moiety of the left renal collecting system and as such, the left-sided percutaneous nephrostomy catheter may be manipulated into the superior aspect of the left ureter to better drain both left-sided renal moieties. Electronically Signed   By: Sandi Mariscal M.D.   On: 01/27/2016 14:15   Ir Nephrostomy Placement Right  Result Date: 01/27/2016 INDICATION: History of rectal cancer, post APR with urinary bladder injury, post suprapubic catheter placement. Concern for persistent urinary leak and as such request made for placement of bilateral percutaneous nephrostomy catheters for urinary  diversion purposes. EXAM: 1. ULTRASOUND GUIDANCE FOR PUNCTURE OF THE LEFT/RIGHT RENAL COLLECTING SYSTEM 2. LEFT/RIGHT PERCUTANEOUS NEPHROSTOMY TUBE PLACEMENT. COMPARISON:  CT the abdomen pelvis - 01/26/2016; 01/12/2016; ultrasound and CT-guided suprapubic catheter placement - 09/23/2015 MEDICATIONS: The patient currently admitted to the hospital and receiving intravenous antibiotics;The antibiotic was administered in an appropriate time frame prior to skin puncture. ANESTHESIA/SEDATION: Fentanyl 150 mcg IV; Versed 3.5 mg IV Total Moderate Sedation Time 35 minutes. CONTRAST:  A total of 20 mL Isovue 300 administered into the bilateral collecting systems FLUOROSCOPY TIME:  3 minutes 18 seconds (762 mGy) COMPLICATIONS: None immediate. PROCEDURE: The procedure, risks, benefits, and alternatives were explained to the patient. Questions regarding the procedure were encouraged and answered. The patient understands and consents to the procedure. A timeout was performed prior to the initiation of the procedure. The bilateral flanks were prepped with  Betadine in a sterile fashion, and a sterile drape was applied covering the operative field. A sterile gown and sterile gloves were used for the procedure. Local anesthesia was provided with 1% Lidocaine with epinephrine. Ultrasound was used to localize the right kidney. Under direct ultrasound guidance, a 21 gauge needle was advanced into the renal collecting system. An ultrasound image documentation was performed. Access within the collecting system was confirmed with the efflux of urine followed by contrast injection. Over a Nitrex wire, the inner three [place] catheter of an Accustick set was advanced into the renal collecting system. Contrast injection was injected into the collecting system as several spot radiographs were obtained in various obliquities confirming puncture within a posterior inferior calix. As such, the tract was dilated with an Accustick stent. Over a guide wire, a 10-French percutaneous nephrostomy catheter was advanced into the collecting system where the coil was formed and locked. Contrast was injected and several sport radiographs were obtained in various obliquities confirming access. The catheter was secured at the skin with a Prolene retention suture and a gravity bag was placed. Attention was now paid towards placement of a left-sided percutaneous nephrostomy catheter. Local anesthesia was provided with 1% Lidocaine with epinephrine. Ultrasound was used to localize the left kidney. Under direct ultrasound guidance, a 21 gauge needle was advanced into the renal collecting system. An ultrasound image documentation was performed. Access within the collecting system was confirmed with the efflux of urine followed by contrast injection. Over a Nitrex wire, the inner three [place] catheter of an Accustick set was advanced into the renal collecting system. Contrast injection was injected into the collecting system as several spot radiographs were obtained in various obliquities confirming  puncture within a posterior inferior calix. As such, the tract was dilated with an Accustick stent. Over a guide wire, a 10-French percutaneous nephrostomy catheter was advanced into the collecting system where the coil was formed and locked. Contrast was injected and several sport radiographs were obtained in various obliquities confirming access. The catheter was secured at the skin with a Prolene retention suture and a gravity bag was placed. A dressing was placed. The patient tolerated procedure well without immediate postprocedural complication. FINDINGS: Ultrasound scanning demonstrates a mildly dilated right-sided renal collecting system with no significant dilatation of the left renal collecting system compatible the findings seen on preceding noncontrast abdominal CT. Under direct ultrasound guidance, a posterior inferior calix was targeted bilaterally allowing advancement of a 10-French percutaneous nephrostomy catheters bilaterally under intermittent fluoroscopic guidance. Contrast injection confirmed appropriate positioning. 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 IMPRESSION: 1. Successful ultrasound fluoroscopic guided placement of bilateral 10 French percutaneous nephrostomy catheters. 2. 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 PLAN: If patient is still having a significant output from his suprapubic catheter, this may be secondary to the superior moiety of the left renal collecting system and as such, the left-sided percutaneous nephrostomy catheter may be manipulated into the superior aspect of the left ureter to better drain both left-sided renal moieties. Electronically Signed   By: Sandi Mariscal M.D.   On: 01/27/2016 14:15    Scheduled Meds: . amitriptyline  50 mg Oral QHS  . ceFEPime (MAXIPIME) IV  1 g Intravenous Q8H  .  ciprofloxacin  500 mg Oral BID  . DULoxetine  60 mg Oral Daily  . heparin subcutaneous  5,000 Units Subcutaneous Q8H  . pregabalin  150 mg Oral BID  . zolpidem  10 mg Oral QHS   Continuous Infusions:    LOS: 3 days   Luiscarlos Kaczmarczyk, MD FACP Hospitalist.   If 7PM-7AM, please contact night-coverage [email or web address] Password [facility] 01/29/2016, 8:47 AM

## 2016-01-30 MED ORDER — METHOCARBAMOL 500 MG PO TABS
500.0000 mg | ORAL_TABLET | Freq: Four times a day (QID) | ORAL | Status: DC | PRN
  Administered 2016-01-30 – 2016-01-31 (×2): 500 mg via ORAL
  Filled 2016-01-30 (×2): qty 1

## 2016-01-30 NOTE — Progress Notes (Signed)
PROGRESS NOTE    Andrew Kline  XBL:[id number] DOB: 1965/10/16 DOA: 01/25/2016 PCP: Andrew Hazard, MD    Brief Narrative: Brief Narrative:50 y/o man admitted from home on 9/25 with abdominal and back pain. Has a complicated GU/rectal anatomy due to h/o rectal cancer with damage to urethra during surgery. He has developed a leak with probable extravasation into the pelvic cavity. Has a suprapubic catheter at present. Believed to have a complicated UTI. He has been on Cefepime, and urology has seen him. He had bilateral PCN placed on Sept 27, 2017. He is on Cefepime as his UA showed TNTC WBC, but culture was negative.    Assessment & Plan:   Active Problems:   Rectal carcinoma (HCC)   UTI (lower urinary tract infection)   Complicated UTI (urinary tract infection)  Complicated UTI -Agree with broad spectrum abx pending cx data. -GU following. S/p bilateral PCN Sept 27. Urine culture NG. IV Cefepime day 4, Cipro day 2.  He would like to be discharged tomorrow.   Metastatic Rectal Cancer -s/p surgery. -Follow up with oncology as an OP.  DVT prophylaxis: Lovenox Code Status:FULL CODE (reconfirmed by me today) Family Communication:None. Disposition Plan:likely to home when appropriate.   Consultants:  Urology.     Antimicrobials: Anti-infectives    Start     Dose/Rate Route Frequency Ordered Stop   01/29/16 0845  ciprofloxacin (CIPRO) tablet 500 mg     500 mg Oral 2 times daily 01/29/16 0834     01/26/16 0600  ceFEPIme (MAXIPIME) 1 g in dextrose 5 % 50 mL IVPB  Status:  Discontinued     1 g 100 mL/hr over 30 Minutes Intravenous Every 8 hours 01/25/16 2017 01/29/16 0900   01/25/16 2000  ceFEPIme (MAXIPIME) 2 g in dextrose 5 % 50 mL IVPB     2 g 100 mL/hr over 30 Minutes Intravenous  Once 01/25/16 1952 01/25/16 2121       Subjective:  Doing well.  No complaints.    Objective: Vitals:   01/29/16 1307 01/29/16 1808 01/29/16 2252 01/30/16 0642  BP:  117/63 115/70 119/64 (!) 124/46  Pulse: 83 74 77 78  Resp: '20 20 19 18  '$ Temp: 98.3 F (36.8 C) 98.3 F (36.8 C) 98 F (36.7 C) 98.4 F (36.9 C)  TempSrc: Oral Oral Oral Oral  SpO2: 95% 98% 94% 94%  Weight:      Height:        Intake/Output Summary (Last 24 hours) at 01/30/16 1037 Last data filed at 01/30/16 0600  Gross per 24 hour  Intake              240 ml  Output             3300 ml  Net            -3060 ml   Filed Weights   01/25/16 1722 01/25/16 2344  Weight: 87.5 kg (193 lb) 87.5 kg (192 lb 12.8 oz)    Examination:  General exam: Appears calm and comfortable  Respiratory system: Clear to auscultation. Respiratory effort normal. Cardiovascular system: S1 & S2 heard, RRR. No JVD, murmurs, rubs, gallops or clicks. No pedal edema. Gastrointestinal system: Abdomen is nondistended, soft and nontender. No organomegaly or masses felt. Normal bowel sounds heard. Central nervous system: Alert and oriented. No focal neurological deficits. Extremities: Symmetric 5 x 5 power. Skin: No rashes, lesions or ulcers Psychiatry: Judgement and insight appear normal. Mood & affect appropriate.   Data Reviewed:  I have personally reviewed following labs and imaging studies  CBC:  Recent Labs Lab 01/25/16 1805 01/26/16 0546 01/27/16 0515  WBC 13.4* 6.2 5.8  HGB 13.7 11.0* 12.1*  HCT 42.8 36.2* 38.6*  MCV 83.4 85.8 85.6  PLT 440* 357 191   Basic Metabolic Panel:  Recent Labs Lab 01/25/16 1805 01/26/16 0546 01/27/16 0515  NA 136 138 137  K 4.0 3.8 3.7  CL 103 105 103  CO2 '24 27 29  '$ GLUCOSE 124* 133* 105*  BUN '9 9 10  '$ CREATININE 0.99 0.98 0.83  CALCIUM 8.7* 7.9* 8.3*   GFR: Estimated Creatinine Clearance: 107.9 mL/min (by C-G formula based on SCr of 0.83 mg/dL). Liver Function Tests:  Recent Labs Lab 01/25/16 1805 01/26/16 0546  AST 19 14*  ALT 19 14*  ALKPHOS 117 89  BILITOT 0.5 0.4  PROT 7.7 6.2*  ALBUMIN 3.6 2.8*    Recent Labs Lab 01/25/16 1805    LIPASE 20   Coagulation Profile:  Recent Labs Lab 01/26/16 1540  INR 1.00   Cardiac Enzymes:  Recent Labs Lab 01/25/16 1805 01/25/16 2038  LATICACIDVEN 1.8 1.1    Recent Results (from the past 240 hour(s))  Urine culture     Status: Abnormal   Collection Time: 01/25/16  7:49 PM  Result Value Ref Range Status   Specimen Description URINE, SUPRAPUBIC  Final   Special Requests NONE  Final   Culture MULTIPLE SPECIES PRESENT, SUGGEST RECOLLECTION (A)  Final   Report Status 01/29/2016 FINAL  Final  Blood culture (routine x 2)     Status: None (Preliminary result)   Collection Time: 01/25/16  8:20 PM  Result Value Ref Range Status   Specimen Description BLOOD RIGHT ANTECUBITAL DRAWN BY RN  Final   Special Requests BOTTLES DRAWN AEROBIC AND ANAEROBIC 6CC  Final   Culture NO GROWTH 3 DAYS  Final   Report Status PENDING  Incomplete  Blood culture (routine x 2)     Status: None (Preliminary result)   Collection Time: 01/25/16  8:38 PM  Result Value Ref Range Status   Specimen Description BLOOD LEFT ANTECUBITAL  Final   Special Requests BOTTLES DRAWN AEROBIC AND ANAEROBIC 6CC  Final   Culture NO GROWTH 3 DAYS  Final   Report Status PENDING  Incomplete     Radiology Studies: No results found.  Scheduled Meds: . amitriptyline  50 mg Oral QHS  . ciprofloxacin  500 mg Oral BID  . DULoxetine  60 mg Oral Daily  . heparin subcutaneous  5,000 Units Subcutaneous Q8H  . pregabalin  150 mg Oral BID  . zolpidem  10 mg Oral QHS   Continuous Infusions:    LOS: 4 days   Marlen Koman, MD FACP Hospitalist.   If 7PM-7AM, please contact night-coverage [email or web address] Password [email or web address] 01/30/2016, 10:37 AM

## 2016-01-31 LAB — CULTURE, BLOOD (ROUTINE X 2)
CULTURE: NO GROWTH
CULTURE: NO GROWTH

## 2016-01-31 MED ORDER — CIPROFLOXACIN HCL 500 MG PO TABS: 500.0000 mg | ORAL_TABLET | Freq: Two times a day (BID) | ORAL | 0 refills | Status: DC

## 2016-01-31 NOTE — Discharge Summary (Signed)
Physician Discharge Summary  Andrew Kline MOL:[id number] DOB: [birth year] DOA: 01/25/2016  PCP: [name], MD  Admit date: 01/25/2016 Discharge date: 01/31/2016  Admitted From: Home.  Disposition:  Home.   Recommendations for Outpatient Follow-up:  1. Follow up with PCP in 1-2 weeks 2. Follow up with urology in 2-3 weeks. 3. Follow up with Dr [name] as scheduled.   Home Health: No Equipment/Devices: None.  Discharge Condition: Stable.  No evidence of infection.  Bilateral PCN functioning well.  Pain adequately controlled.  CODE STATUS: FULL CODE.  Diet recommendation: As tolerated.   Brief/Interim Summary: Patient was admtited into the hospital by [name] on Sept 25, 2017 for flank pain.  As per his H and P:  "  Andrew Kline  is a 50 y.o. male, With history of metastatic rectal cancer, renal cell carcinoma right kidney status post partial nephrectomy, [name] date November pain radiating to groin bilaterally. Patient has a suprapubic catheter in place, he has also noticed increased sediment in the urine with some foul-smelling urine. Patient apparently had a CT scan abdomen and pelvis done on 01/13/16, which showed chronic changes. He recently saw urologist Dr. Dorina Hoyer, as outpatient. Patient described pain as 10/10 in intensity, he says that this pain is new and note had discussed been before. Patient also has colostomy bag in place.  In the ED lab work showed WBC 13.4, lactate 1.8, UA showed positive nitrite.  He denies nausea and vomiting. Also has been having more stool output from colostomy. He denies chest pain, no shortness of breath. No fever or chills. Temperature in the ED was 100.2  HOSPITAL COURSE:   50 y/o man admitted from home on 9/25 with abdominal and back pain. It was felt that he has complicated GU/rectal anatomy due to h/o rectal cancer with damage to urethra during surgery. He has developed a leak with probable extravasation into the pelvic cavity. He  already had a suprapubic catheter, and a colostomy.  Urology was consulted and [name] had seen him, agreed with antibiotics, and made arrangement for bilateral percutaneous nephostomy tube to be placed.  He went to [facility], and had bilateral PCN placed on Sept 27, 2017. He was continued on Cefepime as his UA showed TNTC WBCs, but culture was negative. Subsequently, he was switched to oral Cipro.  I spoke with urology, and at this time, urology felt he can be discharged.  He will have the nephrostomy tube for at least several months.  I will continue him on Cipro to finish the Tx for UTI, but urology specifically did not feel he requires chronic suppressive antibiotics.   He will see [name] for urological follow up in 2-3 weeks, Dr [name] as scheduled for his oncological follow up and His PCP in one week.  Thank you for allowing me to particiapte in his care, and  Good Day.     Discharge Diagnoses:  Active Problems:   Rectal carcinoma (HCC)   UTI (lower urinary tract infection)   Complicated UTI (urinary tract infection)    Discharge Instructions  Discharge Instructions    Diet - low sodium heart healthy    Complete by:  As directed    Increase activity slowly    Complete by:  As directed        Medication List    STOP taking these medications   fentaNYL 75 MCG/HR Commonly known as:  DURAGESIC - dosed mcg/hr   HYDROmorphone 4 MG tablet Commonly known as:  DILAUDID   lidocaine-prilocaine cream Commonly known as:  EMLA   ondansetron 4 MG disintegrating tablet Commonly known as:  ZOFRAN ODT   predniSONE 10 MG (21) Tbpk tablet Commonly known as:  STERAPRED UNI-PAK 21 TAB   prochlorperazine 10 MG tablet Commonly known as:  COMPAZINE   SLEEP AID PO     TAKE these medications   albuterol 108 (90 Base) MCG/ACT inhaler Commonly known as:  PROVENTIL HFA;VENTOLIN HFA Inhale 2 puffs into the lungs every 6 (six) hours as needed for wheezing or shortness of  breath.   amitriptyline 50 MG tablet Commonly known as:  ELAVIL Take 1 tablet (50 mg total) by mouth at bedtime.   ciprofloxacin 500 MG tablet Commonly known as:  CIPRO Take 1 tablet (500 mg total) by mouth 2 (two) times daily.   docusate sodium 100 MG capsule Commonly known as:  COLACE Take 1 capsule (100 mg total) by mouth every 12 (twelve) hours. What changed:  when to take this  reasons to take this   DULoxetine 60 MG capsule Commonly known as:  CYMBALTA Take 1 capsule (60 mg total) by mouth daily.   pregabalin 75 MG capsule Commonly known as:  LYRICA Take 2 capsules (150 mg total) by mouth 2 (two) times daily.   zolpidem 12.5 MG CR tablet Commonly known as:  AMBIEN CR Take 1 tablet (12.5 mg total) by mouth at bedtime as needed for sleep. What changed:  Another medication with the same name was removed. Continue taking this medication, and follow the directions you see here.      Follow-up Information    [name], MD On 02/01/2016.   Specialties:  Hematology and Oncology, Oncology Why:  at 9:30 am Contact information: [place] [address] [phone]          Allergies  Allergen Reactions  . Oxaliplatin Itching    Consultations:  Urology   Procedures/Studies: Ct Abdomen Pelvis Wo Contrast  Result Date: 01/26/2016 CLINICAL DATA:  Bilateral flank pain for 3 days. Suprapubic catheter. Rectal cancer. Lung metastasis. EXAM: CT ABDOMEN AND PELVIS WITHOUT CONTRAST TECHNIQUE: Multidetector CT imaging of the abdomen and pelvis was performed following the standard protocol without IV contrast. COMPARISON:  CT 01/12/2016 FINDINGS: Lower chest: Bilateral lower lobe pulmonary nodules again demonstrated. Hepatobiliary: No focal hepatic lesions noncontrast exam. Normal gallbladder. Pancreas: Multiple splenic calcifications within the pancreatic parenchyma. No duct dilatation Spleen: Normal spleen. Adrenals/Urinary Tract: Adrenal glands  normal. Dilatation of the RIGHT renal pelvis and mild RIGHT hydroureter. No obstructing lesion identified. LEFT ureter is normal. Suprapubic pubic catheter within the lumen of the bladder. Bladder is collapsed. In the presacral space, there is a rounded low-density fluid collection measuring 3.4 by 3.6 cm and not changed from comparison CT. Stomach/Bowel: Stomach, small bowel appendix cecum normal. The long segment of small bowel enters a LEFT peristomal hernia without obstruction. This appears to be an abandoned ostomy. The colon and rectosigmoid colon are normal prior proctectomy. Vascular/Lymphatic: Abdominal aorta is normal caliber with atherosclerotic calcification. There is no retroperitoneal or periportal lymphadenopathy. No pelvic lymphadenopathy. Reproductive: Prostatectomy Other: No free fluid or free air Musculoskeletal: No aggressive osseous lesion. IMPRESSION: 1. No clear explanation for RIGHT-sided pain. No significant change from CT 2 weeks prior. 2. Mild RIGHT hydroureter and hydronephrosis is similar to comparison exam. 3. Suprapubic catheter within a collapsed bladder. 4. Stable rounded fluid collection in the presacral space. 5. Long segment of small bowel enters a LEFT lower quadrant peristomal  hernia. No evidence of bowel obstruction. 6. Pulmonary metastasis. Electronically Signed   By: Suzy Bouchard M.D.   On: 01/26/2016 16:54   Ct Chest W Contrast  Result Date: 01/13/2016 CLINICAL DATA:  Rectal cancer diagnosed may 2016, rectal surgery (AP resection) in April 2017. Ureteral injury. Metastatic to lung. Discomfort and discharge. EXAM: CT CHEST, ABDOMEN, AND PELVIS WITH CONTRAST TECHNIQUE: Multidetector CT imaging of the chest, abdomen and pelvis was performed following the standard protocol during bolus administration of intravenous contrast. CONTRAST:  166m ISOVUE-300 IOPAMIDOL (ISOVUE-300) INJECTION 61% COMPARISON:  Multiple exams, including 11/04/2015 FINDINGS: CT CHEST FINDINGS  Cardiovascular: Port-A-Cath tip:  Lower SVC. Mediastinum/Nodes: AP window lymph node 1.2 cm in short axis diameter, image 30/17, previously 0.9 cm. Lungs/Pleura: Scattered enlarging pulmonary nodules in the lungs, some of which are cavitary. An index thick-walled partially cavitary pulmonary nodule measures 2.3 by 2.0 cm on image 71/18, previously 2.0 by 1.6 cm. No new nodules identified although all of the pulmonary nodules appear enlarged. Musculoskeletal: Thoracic spondylosis. CT ABDOMEN PELVIS FINDINGS Hepatobiliary: No liver metastatic lesion identified. Gallbladder unremarkable. Pancreas: Speckled calcifications throughout the pancreatic parenchyma compatible chronic calcific pancreatitis. Spleen: Unremarkable Adrenals/Urinary Tract: Adrenal glands normal. Right hydronephrosis and hydroureter extending down to just past the iliac vessel cross over where the ureter is partially encased by presacral soft tissue density and roots to normal caliber. Delayed excretion from the right kidney. Scarring along the right kidney lower pole likely related to partial nephrectomy based on morphology. Partially duplicated left collecting system. No left hydronephrosis or hydroureter. A suprapubic catheter in the urinary bladder. On the 10 minutes delayed images, there is relatively little contrast in the urinary bladder. On the 25 minutes postvoid images there is a greater amount of contrast in the urinary bladder. On the images labeled post void, which presumably is after opening of the clamp on the suprapubic catheter, bladder volume changes from initial measurement of 390 cm^3 to subsequent measurement of 110 cm^3. I do not see a definite leak in the urinary bladder. There does appear to be tethering, narrowing, or stricture of the distal right ureter just after crossing the iliac vessels. No leak of contrast from the ureters or urinary bladder. Stomach/Bowel: Left colostomy. Peristomal herniation of several loops of small  bowel without strangulation or obstruction. Mild prominence of stool in the remaining colon. Appendix normal. AP resection noted. Presacral soft tissue density, with an internal 3.9 by 2.9 by 4.6 cm (volume = 27 cm^3) area of fluid density centrally in the presacral soft tissue density. This no longer demonstrates gas density internally and is smaller than on the pre drainage CT. Based on image 102 of series 11, the possibility of this fluid collection communicating anteriorly to the vicinity of the prostatic urethra is raised ; the hypodensity of does appear to extend this far forward as several punctate calcifications that are in the central prostate gland. Reviewing the patient' s recent progress notes, the patient does have a brown penile discharge, which would further support the possibility of this connection extending all the way to the prostatic urethra. Vascular/Lymphatic: Aortoiliac atherosclerotic vascular disease. No pathologic adenopathy identified. Reproductive: As noted above, the presacral fluid collection may be communicating with the prostate gland/prostatic urethra. The presacral fluid collection is considerably smaller than on the pre drainage CT of 11/04/2015 and no longer contains gas. The rind of soft tissue density around this fluid collection appears thicker than it did on 11/04/2015 although this still might be ascribed to radiation  therapy and inflammatory related findings, but merits careful observation. Tissue planes around the prostate gland are considerably indistinct. Other: No supplemental non-categorized findings. Musculoskeletal: Degenerative disc disease at L5-S1. IMPRESSION: 1. Enlarging bilateral pulmonary nodules compatible with progressive metastatic disease. Some of these nodules are cavitary. 2. Right hydronephrosis and hydroureter due to a distal stricture or regional wall thickening in the right distal ureter starting just past the iliac vessel cross over. This results in  delayed excretion on the right, and considerable right hydronephrosis. In the area of involvement, the ureter does hurt the margin of the presacral soft tissue density. 3. Increase in presacral soft tissue density noted. Reduced size of the internal fluid collection within this soft tissue density, currently 27 cc. I suspect that this collection may be extending anteriorly along its lower margin and possibly connecting to the prostatic urethra based on how for forwarded extends and based on the history of a brown discharge from the penis. The prior gas in this collection is no longer present but residual infection is not entirely excluded. The presacral soft tissue density does extend to the posterior bladder margin but I do not see any posterior leak of contrast from the urinary bladder or ureters on the delayed postvoid images. A suprapubic catheter is in place. 4. Left colostomy with peristomal hernia containing several loops of small bowel without complicating feature. Mild prominence of stool in the remaining colon. Electronically Signed   By: Van Clines M.D.   On: 01/13/2016 08:32   Ct Abdomen Pelvis W Contrast  Result Date: 01/13/2016 CLINICAL DATA:  Rectal cancer diagnosed may 2016, rectal surgery (AP resection) in April 2017. Ureteral injury. Metastatic to lung. Discomfort and discharge. EXAM: CT CHEST, ABDOMEN, AND PELVIS WITH CONTRAST TECHNIQUE: Multidetector CT imaging of the chest, abdomen and pelvis was performed following the standard protocol during bolus administration of intravenous contrast. CONTRAST:  161m ISOVUE-300 IOPAMIDOL (ISOVUE-300) INJECTION 61% COMPARISON:  Multiple exams, including 11/04/2015 FINDINGS: CT CHEST FINDINGS Cardiovascular: Port-A-Cath tip:  Lower SVC. Mediastinum/Nodes: AP window lymph node 1.2 cm in short axis diameter, image 30/17, previously 0.9 cm. Lungs/Pleura: Scattered enlarging pulmonary nodules in the lungs, some of which are cavitary. An index  thick-walled partially cavitary pulmonary nodule measures 2.3 by 2.0 cm on image 71/18, previously 2.0 by 1.6 cm. No new nodules identified although all of the pulmonary nodules appear enlarged. Musculoskeletal: Thoracic spondylosis. CT ABDOMEN PELVIS FINDINGS Hepatobiliary: No liver metastatic lesion identified. Gallbladder unremarkable. Pancreas: Speckled calcifications throughout the pancreatic parenchyma compatible chronic calcific pancreatitis. Spleen: Unremarkable Adrenals/Urinary Tract: Adrenal glands normal. Right hydronephrosis and hydroureter extending down to just past the iliac vessel cross over where the ureter is partially encased by presacral soft tissue density and roots to normal caliber. Delayed excretion from the right kidney. Scarring along the right kidney lower pole likely related to partial nephrectomy based on morphology. Partially duplicated left collecting system. No left hydronephrosis or hydroureter. A suprapubic catheter in the urinary bladder. On the 10 minutes delayed images, there is relatively little contrast in the urinary bladder. On the 25 minutes postvoid images there is a greater amount of contrast in the urinary bladder. On the images labeled post void, which presumably is after opening of the clamp on the suprapubic catheter, bladder volume changes from initial measurement of 390 cm^3 to subsequent measurement of 110 cm^3. I do not see a definite leak in the urinary bladder. There does appear to be tethering, narrowing, or stricture of the distal right ureter just after  crossing the iliac vessels. No leak of contrast from the ureters or urinary bladder. Stomach/Bowel: Left colostomy. Peristomal herniation of several loops of small bowel without strangulation or obstruction. Mild prominence of stool in the remaining colon. Appendix normal. AP resection noted. Presacral soft tissue density, with an internal 3.9 by 2.9 by 4.6 cm (volume = 27 cm^3) area of fluid density centrally  in the presacral soft tissue density. This no longer demonstrates gas density internally and is smaller than on the pre drainage CT. Based on image 102 of series 11, the possibility of this fluid collection communicating anteriorly to the vicinity of the prostatic urethra is raised ; the hypodensity of does appear to extend this far forward as several punctate calcifications that are in the central prostate gland. Reviewing the patient' s recent progress notes, the patient does have a brown penile discharge, which would further support the possibility of this connection extending all the way to the prostatic urethra. Vascular/Lymphatic: Aortoiliac atherosclerotic vascular disease. No pathologic adenopathy identified. Reproductive: As noted above, the presacral fluid collection may be communicating with the prostate gland/prostatic urethra. The presacral fluid collection is considerably smaller than on the pre drainage CT of 11/04/2015 and no longer contains gas. The rind of soft tissue density around this fluid collection appears thicker than it did on 11/04/2015 although this still might be ascribed to radiation therapy and inflammatory related findings, but merits careful observation. Tissue planes around the prostate gland are considerably indistinct. Other: No supplemental non-categorized findings. Musculoskeletal: Degenerative disc disease at L5-S1. IMPRESSION: 1. Enlarging bilateral pulmonary nodules compatible with progressive metastatic disease. Some of these nodules are cavitary. 2. Right hydronephrosis and hydroureter due to a distal stricture or regional wall thickening in the right distal ureter starting just past the iliac vessel cross over. This results in delayed excretion on the right, and considerable right hydronephrosis. In the area of involvement, the ureter does hurt the margin of the presacral soft tissue density. 3. Increase in presacral soft tissue density noted. Reduced size of the internal  fluid collection within this soft tissue density, currently 27 cc. I suspect that this collection may be extending anteriorly along its lower margin and possibly connecting to the prostatic urethra based on how for forwarded extends and based on the history of a brown discharge from the penis. The prior gas in this collection is no longer present but residual infection is not entirely excluded. The presacral soft tissue density does extend to the posterior bladder margin but I do not see any posterior leak of contrast from the urinary bladder or ureters on the delayed postvoid images. A suprapubic catheter is in place. 4. Left colostomy with peristomal hernia containing several loops of small bowel without complicating feature. Mild prominence of stool in the remaining colon. Electronically Signed   By: Van Clines M.D.   On: 01/13/2016 08:32   Dg Chest Portable 1 View  Result Date: 01/25/2016 CLINICAL DATA:  Acute onset of bilateral flank pain. Low-grade fever. Initial encounter. EXAM: PORTABLE CHEST 1 VIEW COMPARISON:  CT of the chest performed 01/12/2016 FINDINGS: Vascular congestion is noted. Increased interstitial markings raise concern for mild interstitial edema. No pleural effusion or pneumothorax is seen. The cardiomediastinal silhouette is normal in size. No acute osseous abnormalities are seen. A right-sided chest port is noted ending about the mid SVC. IMPRESSION: Vascular congestion noted. Increased interstitial markings raise concern for mild interstitial edema. Electronically Signed   By: Garald Balding M.D.   On: 01/25/2016  21:30   Ir Nephrostomy Placement Left  Result Date: 01/27/2016 INDICATION: History of rectal cancer, post APR with urinary bladder injury, post suprapubic catheter placement. Concern for persistent urinary leak and as such request made for placement of bilateral percutaneous nephrostomy catheters for urinary diversion purposes. EXAM: 1. ULTRASOUND GUIDANCE FOR  PUNCTURE OF THE LEFT/RIGHT RENAL COLLECTING SYSTEM 2. LEFT/RIGHT PERCUTANEOUS NEPHROSTOMY TUBE PLACEMENT. COMPARISON:  CT the abdomen pelvis - 01/26/2016; 01/12/2016; ultrasound and CT-guided suprapubic catheter placement - 09/23/2015 MEDICATIONS: The patient currently admitted to the hospital and receiving intravenous antibiotics;The antibiotic was administered in an appropriate time frame prior to skin puncture. ANESTHESIA/SEDATION: Fentanyl 150 mcg IV; Versed 3.5 mg IV Total Moderate Sedation Time 35 minutes. CONTRAST:  A total of 20 mL Isovue 300 administered into the bilateral collecting systems FLUOROSCOPY TIME:  3 minutes 18 seconds (893 mGy) COMPLICATIONS: None immediate. PROCEDURE: The procedure, risks, benefits, and alternatives were explained to the patient. Questions regarding the procedure were encouraged and answered. The patient understands and consents to the procedure. A timeout was performed prior to the initiation of the procedure. The bilateral flanks were prepped with Betadine in a sterile fashion, and a sterile drape was applied covering the operative field. A sterile gown and sterile gloves were used for the procedure. Local anesthesia was provided with 1% Lidocaine with epinephrine. Ultrasound was used to localize the right kidney. Under direct ultrasound guidance, a 21 gauge needle was advanced into the renal collecting system. An ultrasound image documentation was performed. Access within the collecting system was confirmed with the efflux of urine followed by contrast injection. Over a Nitrex wire, the inner three [place] catheter of an Accustick set was advanced into the renal collecting system. Contrast injection was injected into the collecting system as several spot radiographs were obtained in various obliquities confirming puncture within a posterior inferior calix. As such, the tract was dilated with an Accustick stent. Over a guide wire, a 10-French percutaneous nephrostomy catheter  was advanced into the collecting system where the coil was formed and locked. Contrast was injected and several sport radiographs were obtained in various obliquities confirming access. The catheter was secured at the skin with a Prolene retention suture and a gravity bag was placed. Attention was now paid towards placement of a left-sided percutaneous nephrostomy catheter. Local anesthesia was provided with 1% Lidocaine with epinephrine. Ultrasound was used to localize the left kidney. Under direct ultrasound guidance, a 21 gauge needle was advanced into the renal collecting system. An ultrasound image documentation was performed. Access within the collecting system was confirmed with the efflux of urine followed by contrast injection. Over a Nitrex wire, the inner three [place] catheter of an Accustick set was advanced into the renal collecting system. Contrast injection was injected into the collecting system as several spot radiographs were obtained in various obliquities confirming puncture within a posterior inferior calix. As such, the tract was dilated with an Accustick stent. Over a guide wire, a 10-French percutaneous nephrostomy catheter was advanced into the collecting system where the coil was formed and locked. Contrast was injected and several sport radiographs were obtained in various obliquities confirming access. The catheter was secured at the skin with a Prolene retention suture and a gravity bag was placed. A dressing was placed. The patient tolerated procedure well without immediate postprocedural complication. FINDINGS: Ultrasound scanning demonstrates a mildly dilated right-sided renal collecting system with no significant dilatation of the left renal collecting system compatible the findings seen on preceding noncontrast abdominal CT.  Under direct ultrasound guidance, a posterior inferior calix was targeted bilaterally allowing advancement of a 10-French percutaneous nephrostomy catheters  bilaterally under intermittent fluoroscopic guidance. Contrast injection confirmed appropriate positioning. 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 IMPRESSION: 1. Successful ultrasound fluoroscopic guided placement of bilateral 10 French percutaneous nephrostomy catheters. 2. 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 PLAN: If patient is still having a significant output from his suprapubic catheter, this may be secondary to the superior moiety of the left renal collecting system and as such, the left-sided percutaneous nephrostomy catheter may be manipulated into the superior aspect of the left ureter to better drain both left-sided renal moieties. Electronically Signed   By: Sandi Mariscal M.D.   On: 01/27/2016 14:15   Ir Nephrostomy Placement Right  Result Date: 01/27/2016 INDICATION: History of rectal cancer, post APR with urinary bladder injury, post suprapubic catheter placement. Concern for persistent urinary leak and as such request made for placement of bilateral percutaneous nephrostomy catheters for urinary diversion purposes. EXAM: 1. ULTRASOUND GUIDANCE FOR PUNCTURE OF THE LEFT/RIGHT RENAL COLLECTING SYSTEM 2. LEFT/RIGHT PERCUTANEOUS NEPHROSTOMY TUBE PLACEMENT. COMPARISON:  CT the abdomen pelvis - 01/26/2016; 01/12/2016; ultrasound and CT-guided suprapubic catheter placement - 09/23/2015 MEDICATIONS: The patient currently admitted to the hospital and receiving intravenous antibiotics;The antibiotic was administered in an appropriate time frame prior to skin puncture. ANESTHESIA/SEDATION: Fentanyl 150 mcg IV; Versed 3.5 mg IV Total Moderate Sedation Time 35 minutes. CONTRAST:  A total of 20 mL Isovue 300 administered into the bilateral collecting systems FLUOROSCOPY TIME:  3 minutes 18 seconds (149 mGy) COMPLICATIONS: None immediate.  PROCEDURE: The procedure, risks, benefits, and alternatives were explained to the patient. Questions regarding the procedure were encouraged and answered. The patient understands and consents to the procedure. A timeout was performed prior to the initiation of the procedure. The bilateral flanks were prepped with Betadine in a sterile fashion, and a sterile drape was applied covering the operative field. A sterile gown and sterile gloves were used for the procedure. Local anesthesia was provided with 1% Lidocaine with epinephrine. Ultrasound was used to localize the right kidney. Under direct ultrasound guidance, a 21 gauge needle was advanced into the renal collecting system. An ultrasound image documentation was performed. Access within the collecting system was confirmed with the efflux of urine followed by contrast injection. Over a Nitrex wire, the inner three [place] catheter of an Accustick set was advanced into the renal collecting system. Contrast injection was injected into the collecting system as several spot radiographs were obtained in various obliquities confirming puncture within a posterior inferior calix. As such, the tract was dilated with an Accustick stent. Over a guide wire, a 10-French percutaneous nephrostomy catheter was advanced into the collecting system where the coil was formed and locked. Contrast was injected and several sport radiographs were obtained in various obliquities confirming access. The catheter was secured at the skin with a Prolene retention suture and a gravity bag was placed. Attention was now paid towards placement of a left-sided percutaneous nephrostomy catheter. Local anesthesia was provided with 1% Lidocaine with epinephrine. Ultrasound was used to localize the left kidney. Under direct ultrasound guidance, a 21 gauge needle was advanced into the renal collecting system. An ultrasound image documentation was performed. Access within the collecting system was  confirmed with the efflux of urine followed by contrast injection. Over a Nitrex wire, the inner  three [place] catheter of an Accustick set was advanced into the renal collecting system. Contrast injection was injected into the collecting system as several spot radiographs were obtained in various obliquities confirming puncture within a posterior inferior calix. As such, the tract was dilated with an Accustick stent. Over a guide wire, a 10-French percutaneous nephrostomy catheter was advanced into the collecting system where the coil was formed and locked. Contrast was injected and several sport radiographs were obtained in various obliquities confirming access. The catheter was secured at the skin with a Prolene retention suture and a gravity bag was placed. A dressing was placed. The patient tolerated procedure well without immediate postprocedural complication. FINDINGS: Ultrasound scanning demonstrates a mildly dilated right-sided renal collecting system with no significant dilatation of the left renal collecting system compatible the findings seen on preceding noncontrast abdominal CT. Under direct ultrasound guidance, a posterior inferior calix was targeted bilaterally allowing advancement of a 10-French percutaneous nephrostomy catheters bilaterally under intermittent fluoroscopic guidance. Contrast injection confirmed appropriate positioning. 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 IMPRESSION: 1. Successful ultrasound fluoroscopic guided placement of bilateral 10 French percutaneous nephrostomy catheters. 2. 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 PLAN: If patient is still having a significant output from his suprapubic catheter, this may be secondary to the superior moiety of the left renal collecting system and as such, the left-sided  percutaneous nephrostomy catheter may be manipulated into the superior aspect of the left ureter to better drain both left-sided renal moieties. Electronically Signed   By: Sandi Mariscal M.D.   On: 01/27/2016 14:15    Subjective: Doing well.   Discharge Exam: Vitals:   01/30/16 2044 01/31/16 0513  BP: 114/62 130/63  Pulse: 72 100  Resp: 18 18  Temp: 98.1 F (36.7 C) 99 F (37.2 C)   Vitals:   01/30/16 0642 01/30/16 1430 01/30/16 2044 01/31/16 0513  BP: (!) 124/46 132/81 114/62 130/63  Pulse: 78 79 72 100  Resp: '18 18 18 18  '$ Temp: 98.4 F (36.9 C) 97.5 F (36.4 C) 98.1 F (36.7 C) 99 F (37.2 C)  TempSrc: Oral Axillary Oral Oral  SpO2: 94% 95% 96% 94%  Weight:      Height:        General: Pt is alert, awake, not in acute distress Cardiovascular: RRR, S1/S2 +, no rubs, no gallops Respiratory: CTA bilaterally, no wheezing, no rhonchi Abdominal: Soft, NT, ND, bowel sounds + Extremities: no edema, no cyanosis    The results of significant diagnostics from this hospitalization (including imaging, microbiology, ancillary and laboratory) are listed below for reference.     Microbiology: Recent Results (from the past 240 hour(s))  Urine culture     Status: Abnormal   Collection Time: 01/25/16  7:49 PM  Result Value Ref Range Status   Specimen Description URINE, SUPRAPUBIC  Final   Special Requests NONE  Final   Culture MULTIPLE SPECIES PRESENT, SUGGEST RECOLLECTION (A)  Final   Report Status 01/29/2016 FINAL  Final  Blood culture (routine x 2)     Status: None   Collection Time: 01/25/16  8:20 PM  Result Value Ref Range Status   Specimen Description BLOOD RIGHT ANTECUBITAL DRAWN BY RN  Final   Special Requests BOTTLES DRAWN AEROBIC AND ANAEROBIC 6CC  Final   Culture NO GROWTH 6 DAYS  Final   Report Status 01/31/2016 FINAL  Final  Blood culture (routine x 2)     Status: None   Collection Time: 01/25/16  8:38 PM  Result Value Ref Range Status   Specimen Description  BLOOD LEFT ANTECUBITAL  Final   Special Requests BOTTLES DRAWN AEROBIC AND ANAEROBIC [facility]  Final   Culture NO GROWTH 6 DAYS  Final   Report Status 01/31/2016 FINAL  Final     Basic Metabolic Panel:  Recent Labs Lab 01/25/16 1805 01/26/16 0546 01/27/16 0515  NA 136 138 137  K 4.0 3.8 3.7  CL 103 105 103  CO2 '24 27 29  '$ GLUCOSE 124* 133* 105*  BUN '9 9 10  '$ CREATININE 0.99 0.98 0.83  CALCIUM 8.7* 7.9* 8.3*   Liver Function Tests:  Recent Labs Lab 01/25/16 1805 01/26/16 0546  AST 19 14*  ALT 19 14*  ALKPHOS 117 89  BILITOT 0.5 0.4  PROT 7.7 6.2*  ALBUMIN 3.6 2.8*    Recent Labs Lab 01/25/16 1805  LIPASE 20   No results for input(s): AMMONIA in the last 168 hours. CBC:  Recent Labs Lab 01/25/16 1805 01/26/16 0546 01/27/16 0515  WBC 13.4* 6.2 5.8  HGB 13.7 11.0* 12.1*  HCT 42.8 36.2* 38.6*  MCV 83.4 85.8 85.6  PLT 440* 357 343   Urinalysis    Component Value Date/Time   COLORURINE YELLOW 01/25/2016 1725   APPEARANCEUR HAZY (A) 01/25/2016 1725   LABSPEC <1.005 (L) 01/25/2016 1725   PHURINE 6.0 01/25/2016 1725   GLUCOSEU NEGATIVE 01/25/2016 1725   HGBUR MODERATE (A) 01/25/2016 1725   BILIRUBINUR NEGATIVE 01/25/2016 1725   KETONESUR NEGATIVE 01/25/2016 1725   PROTEINUR 30 (A) 01/25/2016 1725   UROBILINOGEN 0.2 03/17/2015 0910   NITRITE POSITIVE (A) 01/25/2016 1725   LEUKOCYTESUR LARGE (A) 01/25/2016 1725   Sepsis Labs Invalid input(s): PROCALCITONIN,  WBC,  LACTICIDVEN Microbiology Recent Results (from the past 240 hour(s))  Urine culture     Status: Abnormal   Collection Time: 01/25/16  7:49 PM  Result Value Ref Range Status   Specimen Description URINE, SUPRAPUBIC  Final   Special Requests NONE  Final   Culture MULTIPLE SPECIES PRESENT, SUGGEST RECOLLECTION (A)  Final   Report Status 01/29/2016 FINAL  Final  Blood culture (routine x 2)     Status: None   Collection Time: 01/25/16  8:20 PM  Result Value Ref Range Status   Specimen Description  BLOOD RIGHT ANTECUBITAL DRAWN BY RN  Final   Special Requests BOTTLES DRAWN AEROBIC AND ANAEROBIC 6CC  Final   Culture NO GROWTH 6 DAYS  Final   Report Status 01/31/2016 FINAL  Final  Blood culture (routine x 2)     Status: None   Collection Time: 01/25/16  8:38 PM  Result Value Ref Range Status   Specimen Description BLOOD LEFT ANTECUBITAL  Final   Special Requests BOTTLES DRAWN AEROBIC AND ANAEROBIC 6CC  Final   Culture NO GROWTH 6 DAYS  Final   Report Status 01/31/2016 FINAL  Final     Time coordinating discharge: Over 30 minutes  SIGNED:  Orvan Falconer, MD FACP [facility] 01/31/2016, 10:14 AM   If 7PM-7AM, please contact night-coverage [email or web address] Password [email or web address]

## 2016-02-01 ENCOUNTER — Encounter (HOSPITAL_COMMUNITY): Payer: Self-pay | Admitting: Oncology

## 2016-02-01 ENCOUNTER — Ambulatory Visit (HOSPITAL_COMMUNITY): Payer: Medicaid Other | Admitting: Hematology & Oncology

## 2016-02-01 ENCOUNTER — Encounter (HOSPITAL_COMMUNITY): Payer: Medicaid Other | Attending: Oncology | Admitting: Oncology

## 2016-02-01 VITALS — BP 127/82 | HR 102 | Temp 98.5°F | Resp 18 | Wt 194.4 lb

## 2016-02-01 DIAGNOSIS — R918 Other nonspecific abnormal finding of lung field: Secondary | ICD-10-CM | POA: Insufficient documentation

## 2016-02-01 DIAGNOSIS — C20 Malignant neoplasm of rectum: Secondary | ICD-10-CM

## 2016-02-01 DIAGNOSIS — Z936 Other artificial openings of urinary tract status: Secondary | ICD-10-CM | POA: Diagnosis not present

## 2016-02-01 DIAGNOSIS — C7800 Secondary malignant neoplasm of unspecified lung: Secondary | ICD-10-CM

## 2016-02-01 DIAGNOSIS — K6289 Other specified diseases of anus and rectum: Secondary | ICD-10-CM

## 2016-02-01 MED ORDER — HYDROMORPHONE HCL 4 MG PO TABS: 4.0000 mg | ORAL_TABLET | ORAL | 0 refills | Status: DC | PRN

## 2016-02-01 MED ORDER — FENTANYL 75 MCG/HR TD PT72: 150.0000 ug | MEDICATED_PATCH | TRANSDERMAL | 0 refills | Status: DC

## 2016-02-01 NOTE — Patient Instructions (Addendum)
[place] at [facility] Discharge Instructions  RECOMMENDATIONS MADE BY THE CONSULTANT AND ANY TEST RESULTS WILL BE SENT TO YOUR REFERRING PHYSICIAN.  You saw Kirby Crigler, PA-C, today. Prescription for pain medication given. Hold chemo for 1 week. Finish antibiotics. Follow up in 1 week for treatment. Follow up and treatment in 3 weeks.  Thank you for choosing [place] at [facility] to provide your oncology and hematology care.  To afford each patient quality time with our provider, please arrive at least 15 minutes before your scheduled appointment time.   Beginning January 23rd 2017 lab work for the [facility] will be done in the  Main lab at [facility] on 1st floor. If you have a lab appointment with the [facility] please come in thru the  Main Entrance and check in at the main information desk  You need to re-schedule your appointment should you arrive 10 or more minutes late.  We strive to give you quality time with our providers, and arriving late affects you and other patients whose appointments are after yours.  Also, if you no show three or more times for appointments you may be dismissed from the clinic at the providers discretion.     Again, thank you for choosing [facility].  Our hope is that these requests will decrease the amount of time that you wait before being seen by our physicians.       _____________________________________________________________  Should you have questions after your visit to [facility], please contact our office at [phone] between the hours of 8:30 a.m. and 4:30 p.m.  Voicemails left after 4:30 p.m. will not be returned until the following business day.  For prescription refill requests, have your pharmacy contact our office.         Resources For Cancer Patients and their Caregivers ? American Cancer Society: Can assist with transportation, wigs, general  needs, runs Look Good Feel Better.        367-394-6982 ? Cancer Care: Provides financial assistance, online support groups, medication/co-pay assistance.  1-800-813-HOPE (617)499-6854) ? [place] Assists [place] cancer patients and their families through emotional , educational and financial support.  585-696-6232 ? [place] DSS Where to apply for food stamps, Medicaid and utility assistance. 2520257541 ? [facility]: Transportation to medical appointments. 219-316-7087 ? Social Security Administration: May apply for disability if have a Stage IV cancer. 260-820-8055 2297333778 ? [employer], Disability and Transit Services: Assists with nutrition, care and transit needs. [place] Support Programs: '@10RELATIVEDAYS'$ @ > Cancer Support Group  2nd Tuesday of the month 1pm-2pm, Journey Room  > Creative Journey  3rd Tuesday of the month 1130am-1pm, Journey Room  > Look Good Feel Better  1st Wednesday of the month 10am-12 noon, Journey Room (Call [place] to register [phone])

## 2016-02-01 NOTE — Assessment & Plan Note (Addendum)
STAGE IV Rectal carcinoma, adenocarcinoma with pulmonary metastases.  History of partial nephrectomy in 2012 at [facility] in [place].  Treated with FOLFOX/AVASTIN and XRT for palliation.  Change in therapy to FOLFIRI/Avastin due to progression of pulmonary metastases.  He is S/P colostomy for palliation of poor bowel control and more recently a palliative APR by Dr. Arnoldo Morale; complicated by urethra leak and infection requiring bilateral percutaneous nephrostomy tubes placed by IR on 01/27/2016.  Oncology history updated.  Pre-treatment labs as ordered: CBC diff, CMET, CEA, and UA.  I personally reviewed and went over laboratory results with the patient.  The results are noted within this dictation.  He is scheduled for chemotherapy tomorrow, but he would like to delay it x 1 week, which I think is reasonable given recent hospitalization for UTI and ongoing antibiotic treatment.  Given recent Urostomy tube placement, will HOLD Avastin for his first cycle of therapy (after conferring with Dr. [name]).  Treatment plan and antibody plan are manipulated accordingly.  Rx for Fentanyl and Dilaudid refills.  Of note, he has not been on Dilaudid (the patient reports) x 3 months.  [place] Controlled Substance Reporting System is reviewed and his last Fentanyl Rx was filled on 01/01/2016 and his last dilaudid was filled on 11/18/2015.  I have placed a call to Dr. Diona Fanti to confirm that he is agreeable to pursuing treatment as planned next week.  As of this time (5:26 PM) I have not received a return call.  I will call him back tomorrow.  Return in 1 week for chemotherapy.  Return in 3 weeks for follow-up with next cycle of chemotherapy.  Addendum: Dr. Diona Fanti call this AM (02/02/2016) to return my page.  He is agreeable to pursing systemic chemotherapy next week as planned.

## 2016-02-01 NOTE — Progress Notes (Addendum)
[name], MD [place] [address]  Rectal cancer metastasized to lung [facility]) - Plan: fentaNYL (DURAGESIC - DOSED MCG/HR) 75 MCG/HR, HYDROmorphone (DILAUDID) 4 MG tablet  Rectal pain - Plan: fentaNYL (DURAGESIC - DOSED MCG/HR) 75 MCG/HR, HYDROmorphone (DILAUDID) 4 MG tablet  CURRENT THERAPY: Planning to restart therapy next week: FOLFIRI/Avastin  INTERVAL HISTORY: Keiandre Cygan 50 y.o. male returns for followup of STAGE IV Rectal carcinoma, adenocarcinoma with pulmonary metastases.  History of partial nephrectomy in 2012 at [facility] in [place].  Treated with FOLFOX/AVASTIN and XRT for palliation.  Change in therapy to FOLFIRI/Avastin due to progression of pulmonary metastases.  He is S/P colostomy for palliation of poor bowel control and more recently a palliative APR by Dr. Arnoldo Morale; complicated by urethra leak and infection requiring bilateral percutaneous nephrostomy tubes placed by IR on 01/27/2016.    Rectal cancer metastasized to lung ([place])   09/20/2014 Imaging    CT pelvis- Questionable asymmetric wall thickening or mass along the right wall of the anus/ rectum.       09/25/2014 Imaging    CT abd/pelvis- Indeterminate nodules within the bilateral lung bases with dominant approximately 1.7 cm centrally necrotic nodule within the right lower lobe...      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      10/03/2014 Tumor Marker    CEA NOT ELEVATED      10/09/2014 Imaging    CT Chest- Bilateral pulmonary nodules of varying size are most consistent with pulmonary metastasis. There are approximately 6 lesions in each lung.      10/17/2014 Pathology Results    Rectum, biopsy - INVASIVE ADENOCARCINOMA      10/29/2014 Pathology Results    Diagnosis Lung, needle/core biopsy(ies), right lower  lobe - METASTATIC ADENOCARCINOMA      10/29/2014 Pathology Results    KRAS wildtype      11/04/2014 - 12/18/2014 Radiation Therapy    Dr. Lisbeth Renshaw      11/05/2014 - 05/26/2015 Chemotherapy    FOLFOX + Avastin      02/11/2015 Surgery    Colostomy formation by Dr. Arnoldo Morale.      02/26/2015 Imaging    CT CAP- Interval response to therapy with decrease in size of multiple bilateral pulmonary nodules. No new or progressive pulmonary metastasis.  No evidence for metastatic disease within the abdomen or pelvis.       03/22/2015 Imaging    CT RENAL STONE- There is an obstructing 4 mm stone within the distal left ureter resulting in moderate left hydroureteronephrosis.  Re- demonstrated pulmonary nodules within the right lower lobe and lingula, compatible with known metastasis.      05/20/2015 Progression    CT Scans demonstrate pulmonary progression of disease.      05/20/2015 Imaging    CT CAP- Mild progression of bilateral pulmonary metastases, including a 1.9 cm cavitary nodule in the right lower lobe with increasing solid component.      06/23/2015 - 07/21/2015 Chemotherapy    FOLFIRI + Avastin      08/26/2015 Surgery    Abdominoperineal resection (palliative)      08/26/2015 Pathology Results    microscopic focus of adenocarcinoma of colon, tumor invades submucosa      09/23/2015 Procedure     Temporary Suprapubic catheter  placement IR secondary to small knick in urethra during APR, urinary retention      11/04/2015 Imaging    CT CAP- Presacral collection of fluid and air is new from 05/20/2015 and worrisome for an abscess in this patient status post abdominal perineal resection on 08/26/2015.  Worsening pulmonary metastatic disease. Chronic calcific pancreatitis.      11/04/2015 Progression    CT imaging demonstrating progression of pulmonary nodules      11/04/2015 Imaging    CT C/A/P with presacral collection of fluid and air new worrisome for abscess, worsening pulmonary  metastatic disease, chronic calcific pancreatitis      11/12/2015 Procedure    Successful CT-guided left trans gluteal pelvic abscess drain insertion and successful CT-guided 12 French suprapubic catheter exchange by IR      01/13/2016 Imaging    CT CAP- 1. Enlarging bilateral pulmonary nodules compatible with progressive metastatic disease. Some of these nodules are cavitary. 2. Right hydronephrosis and hydroureter due to a distal stricture or regional wall thickening in the right distal ureter starting just past the iliac vessel cross over. This results in delayed excretion on the right, and considerable right hydronephrosis. In the area of involvement, the ureter does hurt the margin of the presacral soft tissue density. 3. Increase in presacral soft tissue density noted. Reduced size of the internal fluid collection within this soft tissue density, currently 27 cc. I suspect that this collection may be extending anteriorly along its lower margin and possibly connecting to the prostatic urethra based on how for forwarded extends and based on the history of a brown discharge from the penis. The prior gas in this collection is no longer present but residual infection is not entirely excluded. The presacral soft tissue density does extend to the posterior bladder margin but I do not see any posterior leak of contrast from the urinary bladder or ureters on the delayed postvoid images. A suprapubic catheter is in place. 4. Left colostomy with peristomal hernia containing several loops of small bowel without complicating feature. Mild prominence of stool in the remaining colon.      01/13/2016 Progression    CT scans demonstrate progression of pulmonary nodules.      01/25/2016 - 01/31/2016 Hospital Admission    Admit date: 01/25/2016 Admission diagnosis: UTI Additional comments: B/L percutaneous nephrostomy tubes placed.      01/26/2016 Imaging    CT abd/pelvis WO contrast- 1. No  clear explanation for RIGHT-sided pain. No significant change from CT 2 weeks prior. 2. Mild RIGHT hydroureter and hydronephrosis is similar to comparison exam. 3. Suprapubic catheter within a collapsed bladder. 4. Stable rounded fluid collection in the presacral space. 5. Long segment of small bowel enters a LEFT lower quadrant peristomal hernia. No evidence of bowel obstruction. 6. Pulmonary metastasis.      01/27/2016 Procedure    B/L percutaneous nephostomy tubes by IR       Chart is reviewed and recent hospitalization for UTI is noted and appreciated.  He is continued on Cipro as an outpatient.  He was discharged from the hospital yesterday.  He notes pain from left urostomy site.  He notes that it occurs with movement.  He requests a refill on pain medications.  He notes one episode of left sided gross hematuria that has since resolved.  Review of Systems  Constitutional: Negative.  Negative for chills and fever.  HENT: Negative.   Eyes: Negative.   Respiratory: Negative.  Negative for cough and sputum production.  Cardiovascular: Negative.  Negative for chest pain.  Gastrointestinal: Negative.   Genitourinary: Negative.   Musculoskeletal: Negative.   Skin: Negative.   Neurological: Negative.  Negative for weakness.  Endo/Heme/Allergies: Negative.   Psychiatric/Behavioral: Negative.     Past Medical History:  Diagnosis Date  . Anxiety   . Arthritis   . Depression   . Kidney stone 03/22/15   left  . Rectal cancer ([place])   . Rectal pain   . Renal cell cancer ([place])    2012.   [name] Shortness of breath dyspnea     Past Surgical History:  Procedure Laterality Date  . BIOPSY N/A 09/30/2014   Procedure: BIOPSY;  Surgeon: Danie Binder, MD;  Location: [facility];  Service: Endoscopy;  Laterality: N/A;  Anal Canal  . COLOSTOMY N/A 02/11/2015   Procedure: COLOSTOMY;  Surgeon: Aviva Signs, MD;  Location: [facility];  Service: General;  Laterality: N/A;  . FLEXIBLE  SIGMOIDOSCOPY N/A 09/30/2014   Procedure: FLEXIBLE SIGMOIDOSCOPY;  Surgeon: Danie Binder, MD;  Location: [facility];  Service: Endoscopy;  Laterality: N/A;  . FLEXIBLE SIGMOIDOSCOPY N/A 10/17/2014   Procedure: FLEXIBLE SIGMOIDOSCOPY;  Surgeon: Danie Binder, MD;  Location: [facility];  Service: Endoscopy;  Laterality: N/A;  1325  . HYPOSPADIAS CORRECTION    . IR GENERIC HISTORICAL  12/17/2015   IR CATHETER TUBE CHANGE 12/17/2015 [facility]  . IR GENERIC HISTORICAL  01/27/2016   IR NEPHROSTOMY PLACEMENT RIGHT 01/27/2016 [facility]  . IR GENERIC HISTORICAL  01/27/2016   IR NEPHROSTOMY PLACEMENT LEFT 01/27/2016 [facility]  . KNEE SURGERY Left    arthroscopy  . LAPAROSCOPIC PARTIAL NEPHRECTOMY  2012   right side  . LUNG BIOPSY Right   . PERINEAL PROCTECTOMY N/A 08/26/2015   Procedure:  PROCTECTOMY;  Surgeon: Aviva Signs, MD;  Location: [facility];  Service: General;  Laterality: N/A;  . RECTAL SURGERY      Family History  Problem Relation Age of Onset  . Healthy Mother   . Lung cancer Maternal Grandfather   . Colon cancer Maternal Uncle     age 48  . Diabetes Other     Social History   Social History  . Marital status: Divorced    Spouse name: N/A  . Number of children: 2  . Years of education: N/A   Occupational History  . retail    Social History Main Topics  . Smoking status: Former Smoker    Packs/day: 0.50    Years: 30.00    Types: Cigarettes  . Smokeless tobacco: Never Used     Comment: a little over a pack daily  . Alcohol use 0.0 oz/week     Comment: Occ  . Drug use: No  . Sexual activity: Yes    Birth control/ protection: None   Other Topics Concern  . None   Social History Narrative  . None     PHYSICAL EXAMINATION  ECOG PERFORMANCE STATUS: 1 - Symptomatic but completely ambulatory  Vitals:   02/01/16 0956  BP: 127/82  Pulse: (!) 102  Resp: 18  Temp: 98.5 F (36.9 C)    GENERAL:alert, well nourished, well developed, cooperative and  unaccompanied. SKIN: skin color, texture, turgor are normal, no rashes or significant lesions HEAD: Normocephalic, No masses, lesions, tenderness or abnormalities EYES: normal, EOMI, Conjunctiva are pink and non-injected EARS: External ears normal OROPHARYNX:lips, buccal mucosa, and tongue normal and poor dentition  NECK: supple, trachea midline LYMPH:  not examined BREAST:not examined LUNGS:  clear to auscultation  HEART: regular rate & rhythm ABDOMEN:abdomen soft, normal bowel sounds and colostomy noted in RLQ. BACK: Back symmetric, no curvature., B/L perc urostomy tubes noted. EXTREMITIES:less then 2 second capillary refill, no joint deformities, effusion, or inflammation, no skin discoloration, no cyanosis, 3 leg bags noted, two on right (right urostomy tube and suprapubic drain) and one on left (left urostomy tube)  NEURO: alert & oriented x 3 with fluent speech, no focal motor/sensory deficits, gait normal   LABORATORY DATA: CBC    Component Value Date/Time   WBC 5.8 01/27/2016 0515   RBC 4.51 01/27/2016 0515   HGB 12.1 (L) 01/27/2016 0515   HCT 38.6 (L) 01/27/2016 0515   PLT 343 01/27/2016 0515   MCV 85.6 01/27/2016 0515   MCH 26.8 01/27/2016 0515   MCHC 31.3 01/27/2016 0515   RDW 20.5 (H) 01/27/2016 0515   LYMPHSABS 1.0 11/12/2015 0945   MONOABS 0.9 11/12/2015 0945   EOSABS 0.2 11/12/2015 0945   BASOSABS 0.0 11/12/2015 0945      Chemistry      Component Value Date/Time   NA 137 01/27/2016 0515   K 3.7 01/27/2016 0515   CL 103 01/27/2016 0515   CO2 29 01/27/2016 0515   BUN 10 01/27/2016 0515   CREATININE 0.83 01/27/2016 0515      Component Value Date/Time   CALCIUM 8.3 (L) 01/27/2016 0515   ALKPHOS 89 01/26/2016 0546   AST 14 (L) 01/26/2016 0546   ALT 14 (L) 01/26/2016 0546   BILITOT 0.4 01/26/2016 0546     Lab Results  Component Value Date   CEA 3.9 11/06/2015     PENDING LABS:   RADIOGRAPHIC STUDIES:  Ct Abdomen Pelvis Wo Contrast  Result  Date: 01/26/2016 CLINICAL DATA:  Bilateral flank pain for 3 days. Suprapubic catheter. Rectal cancer. Lung metastasis. EXAM: CT ABDOMEN AND PELVIS WITHOUT CONTRAST TECHNIQUE: Multidetector CT imaging of the abdomen and pelvis was performed following the standard protocol without IV contrast. COMPARISON:  CT 01/12/2016 FINDINGS: Lower chest: Bilateral lower lobe pulmonary nodules again demonstrated. Hepatobiliary: No focal hepatic lesions noncontrast exam. Normal gallbladder. Pancreas: Multiple splenic calcifications within the pancreatic parenchyma. No duct dilatation Spleen: Normal spleen. Adrenals/Urinary Tract: Adrenal glands normal. Dilatation of the RIGHT renal pelvis and mild RIGHT hydroureter. No obstructing lesion identified. LEFT ureter is normal. Suprapubic pubic catheter within the lumen of the bladder. Bladder is collapsed. In the presacral space, there is a rounded low-density fluid collection measuring 3.4 by 3.6 cm and not changed from comparison CT. Stomach/Bowel: Stomach, small bowel appendix cecum normal. The long segment of small bowel enters a LEFT peristomal hernia without obstruction. This appears to be an abandoned ostomy. The colon and rectosigmoid colon are normal prior proctectomy. Vascular/Lymphatic: Abdominal aorta is normal caliber with atherosclerotic calcification. There is no retroperitoneal or periportal lymphadenopathy. No pelvic lymphadenopathy. Reproductive: Prostatectomy Other: No free fluid or free air Musculoskeletal: No aggressive osseous lesion. IMPRESSION: 1. No clear explanation for RIGHT-sided pain. No significant change from CT 2 weeks prior. 2. Mild RIGHT hydroureter and hydronephrosis is similar to comparison exam. 3. Suprapubic catheter within a collapsed bladder. 4. Stable rounded fluid collection in the presacral space. 5. Long segment of small bowel enters a LEFT lower quadrant peristomal hernia. No evidence of bowel obstruction. 6. Pulmonary metastasis.  Electronically Signed   By: Suzy Bouchard M.D.   On: 01/26/2016 16:54   Ct Chest W Contrast  Result Date: 01/13/2016 CLINICAL DATA:  Rectal cancer diagnosed may  2016, rectal surgery (AP resection) in April 2017. Ureteral injury. Metastatic to lung. Discomfort and discharge. EXAM: CT CHEST, ABDOMEN, AND PELVIS WITH CONTRAST TECHNIQUE: Multidetector CT imaging of the chest, abdomen and pelvis was performed following the standard protocol during bolus administration of intravenous contrast. CONTRAST:  158m ISOVUE-300 IOPAMIDOL (ISOVUE-300) INJECTION 61% COMPARISON:  Multiple exams, including 11/04/2015 FINDINGS: CT CHEST FINDINGS Cardiovascular: Port-A-Cath tip:  Lower SVC. Mediastinum/Nodes: AP window lymph node 1.2 cm in short axis diameter, image 30/17, previously 0.9 cm. Lungs/Pleura: Scattered enlarging pulmonary nodules in the lungs, some of which are cavitary. An index thick-walled partially cavitary pulmonary nodule measures 2.3 by 2.0 cm on image 71/18, previously 2.0 by 1.6 cm. No new nodules identified although all of the pulmonary nodules appear enlarged. Musculoskeletal: Thoracic spondylosis. CT ABDOMEN PELVIS FINDINGS Hepatobiliary: No liver metastatic lesion identified. Gallbladder unremarkable. Pancreas: Speckled calcifications throughout the pancreatic parenchyma compatible chronic calcific pancreatitis. Spleen: Unremarkable Adrenals/Urinary Tract: Adrenal glands normal. Right hydronephrosis and hydroureter extending down to just past the iliac vessel cross over where the ureter is partially encased by presacral soft tissue density and roots to normal caliber. Delayed excretion from the right kidney. Scarring along the right kidney lower pole likely related to partial nephrectomy based on morphology. Partially duplicated left collecting system. No left hydronephrosis or hydroureter. A suprapubic catheter in the urinary bladder. On the 10 minutes delayed images, there is relatively little  contrast in the urinary bladder. On the 25 minutes postvoid images there is a greater amount of contrast in the urinary bladder. On the images labeled post void, which presumably is after opening of the clamp on the suprapubic catheter, bladder volume changes from initial measurement of 390 cm^3 to subsequent measurement of 110 cm^3. I do not see a definite leak in the urinary bladder. There does appear to be tethering, narrowing, or stricture of the distal right ureter just after crossing the iliac vessels. No leak of contrast from the ureters or urinary bladder. Stomach/Bowel: Left colostomy. Peristomal herniation of several loops of small bowel without strangulation or obstruction. Mild prominence of stool in the remaining colon. Appendix normal. AP resection noted. Presacral soft tissue density, with an internal 3.9 by 2.9 by 4.6 cm (volume = 27 cm^3) area of fluid density centrally in the presacral soft tissue density. This no longer demonstrates gas density internally and is smaller than on the pre drainage CT. Based on image 102 of series 11, the possibility of this fluid collection communicating anteriorly to the vicinity of the prostatic urethra is raised ; the hypodensity of does appear to extend this far forward as several punctate calcifications that are in the central prostate gland. Reviewing the patient' s recent progress notes, the patient does have a brown penile discharge, which would further support the possibility of this connection extending all the way to the prostatic urethra. Vascular/Lymphatic: Aortoiliac atherosclerotic vascular disease. No pathologic adenopathy identified. Reproductive: As noted above, the presacral fluid collection may be communicating with the prostate gland/prostatic urethra. The presacral fluid collection is considerably smaller than on the pre drainage CT of 11/04/2015 and no longer contains gas. The rind of soft tissue density around this fluid collection appears  thicker than it did on 11/04/2015 although this still might be ascribed to radiation therapy and inflammatory related findings, but merits careful observation. Tissue planes around the prostate gland are considerably indistinct. Other: No supplemental non-categorized findings. Musculoskeletal: Degenerative disc disease at L5-S1. IMPRESSION: 1. Enlarging bilateral pulmonary nodules compatible with progressive metastatic disease.  Some of these nodules are cavitary. 2. Right hydronephrosis and hydroureter due to a distal stricture or regional wall thickening in the right distal ureter starting just past the iliac vessel cross over. This results in delayed excretion on the right, and considerable right hydronephrosis. In the area of involvement, the ureter does hurt the margin of the presacral soft tissue density. 3. Increase in presacral soft tissue density noted. Reduced size of the internal fluid collection within this soft tissue density, currently 27 cc. I suspect that this collection may be extending anteriorly along its lower margin and possibly connecting to the prostatic urethra based on how for forwarded extends and based on the history of a brown discharge from the penis. The prior gas in this collection is no longer present but residual infection is not entirely excluded. The presacral soft tissue density does extend to the posterior bladder margin but I do not see any posterior leak of contrast from the urinary bladder or ureters on the delayed postvoid images. A suprapubic catheter is in place. 4. Left colostomy with peristomal hernia containing several loops of small bowel without complicating feature. Mild prominence of stool in the remaining colon. Electronically Signed   By: Van Clines M.D.   On: 01/13/2016 08:32   Ct Abdomen Pelvis W Contrast  Result Date: 01/13/2016 CLINICAL DATA:  Rectal cancer diagnosed may 2016, rectal surgery (AP resection) in April 2017. Ureteral injury. Metastatic  to lung. Discomfort and discharge. EXAM: CT CHEST, ABDOMEN, AND PELVIS WITH CONTRAST TECHNIQUE: Multidetector CT imaging of the chest, abdomen and pelvis was performed following the standard protocol during bolus administration of intravenous contrast. CONTRAST:  165m ISOVUE-300 IOPAMIDOL (ISOVUE-300) INJECTION 61% COMPARISON:  Multiple exams, including 11/04/2015 FINDINGS: CT CHEST FINDINGS Cardiovascular: Port-A-Cath tip:  Lower SVC. Mediastinum/Nodes: AP window lymph node 1.2 cm in short axis diameter, image 30/17, previously 0.9 cm. Lungs/Pleura: Scattered enlarging pulmonary nodules in the lungs, some of which are cavitary. An index thick-walled partially cavitary pulmonary nodule measures 2.3 by 2.0 cm on image 71/18, previously 2.0 by 1.6 cm. No new nodules identified although all of the pulmonary nodules appear enlarged. Musculoskeletal: Thoracic spondylosis. CT ABDOMEN PELVIS FINDINGS Hepatobiliary: No liver metastatic lesion identified. Gallbladder unremarkable. Pancreas: Speckled calcifications throughout the pancreatic parenchyma compatible chronic calcific pancreatitis. Spleen: Unremarkable Adrenals/Urinary Tract: Adrenal glands normal. Right hydronephrosis and hydroureter extending down to just past the iliac vessel cross over where the ureter is partially encased by presacral soft tissue density and roots to normal caliber. Delayed excretion from the right kidney. Scarring along the right kidney lower pole likely related to partial nephrectomy based on morphology. Partially duplicated left collecting system. No left hydronephrosis or hydroureter. A suprapubic catheter in the urinary bladder. On the 10 minutes delayed images, there is relatively little contrast in the urinary bladder. On the 25 minutes postvoid images there is a greater amount of contrast in the urinary bladder. On the images labeled post void, which presumably is after opening of the clamp on the suprapubic catheter, bladder volume  changes from initial measurement of 390 cm^3 to subsequent measurement of 110 cm^3. I do not see a definite leak in the urinary bladder. There does appear to be tethering, narrowing, or stricture of the distal right ureter just after crossing the iliac vessels. No leak of contrast from the ureters or urinary bladder. Stomach/Bowel: Left colostomy. Peristomal herniation of several loops of small bowel without strangulation or obstruction. Mild prominence of stool in the remaining colon. Appendix normal. AP  resection noted. Presacral soft tissue density, with an internal 3.9 by 2.9 by 4.6 cm (volume = 27 cm^3) area of fluid density centrally in the presacral soft tissue density. This no longer demonstrates gas density internally and is smaller than on the pre drainage CT. Based on image 102 of series 11, the possibility of this fluid collection communicating anteriorly to the vicinity of the prostatic urethra is raised ; the hypodensity of does appear to extend this far forward as several punctate calcifications that are in the central prostate gland. Reviewing the patient' s recent progress notes, the patient does have a brown penile discharge, which would further support the possibility of this connection extending all the way to the prostatic urethra. Vascular/Lymphatic: Aortoiliac atherosclerotic vascular disease. No pathologic adenopathy identified. Reproductive: As noted above, the presacral fluid collection may be communicating with the prostate gland/prostatic urethra. The presacral fluid collection is considerably smaller than on the pre drainage CT of 11/04/2015 and no longer contains gas. The rind of soft tissue density around this fluid collection appears thicker than it did on 11/04/2015 although this still might be ascribed to radiation therapy and inflammatory related findings, but merits careful observation. Tissue planes around the prostate gland are considerably indistinct. Other: No supplemental  non-categorized findings. Musculoskeletal: Degenerative disc disease at L5-S1. IMPRESSION: 1. Enlarging bilateral pulmonary nodules compatible with progressive metastatic disease. Some of these nodules are cavitary. 2. Right hydronephrosis and hydroureter due to a distal stricture or regional wall thickening in the right distal ureter starting just past the iliac vessel cross over. This results in delayed excretion on the right, and considerable right hydronephrosis. In the area of involvement, the ureter does hurt the margin of the presacral soft tissue density. 3. Increase in presacral soft tissue density noted. Reduced size of the internal fluid collection within this soft tissue density, currently 27 cc. I suspect that this collection may be extending anteriorly along its lower margin and possibly connecting to the prostatic urethra based on how for forwarded extends and based on the history of a brown discharge from the penis. The prior gas in this collection is no longer present but residual infection is not entirely excluded. The presacral soft tissue density does extend to the posterior bladder margin but I do not see any posterior leak of contrast from the urinary bladder or ureters on the delayed postvoid images. A suprapubic catheter is in place. 4. Left colostomy with peristomal hernia containing several loops of small bowel without complicating feature. Mild prominence of stool in the remaining colon. Electronically Signed   By: Van Clines M.D.   On: 01/13/2016 08:32   Dg Chest Portable 1 View  Result Date: 01/25/2016 CLINICAL DATA:  Acute onset of bilateral flank pain. Low-grade fever. Initial encounter. EXAM: PORTABLE CHEST 1 VIEW COMPARISON:  CT of the chest performed 01/12/2016 FINDINGS: Vascular congestion is noted. Increased interstitial markings raise concern for mild interstitial edema. No pleural effusion or pneumothorax is seen. The cardiomediastinal silhouette is normal in size.  No acute osseous abnormalities are seen. A right-sided chest port is noted ending about the mid SVC. IMPRESSION: Vascular congestion noted. Increased interstitial markings raise concern for mild interstitial edema. Electronically Signed   By: Garald Balding M.D.   On: 01/25/2016 21:30   Ir Nephrostomy Placement Left  Result Date: 01/27/2016 INDICATION: History of rectal cancer, post APR with urinary bladder injury, post suprapubic catheter placement. Concern for persistent urinary leak and as such request made for placement of bilateral  percutaneous nephrostomy catheters for urinary diversion purposes. EXAM: 1. ULTRASOUND GUIDANCE FOR PUNCTURE OF THE LEFT/RIGHT RENAL COLLECTING SYSTEM 2. LEFT/RIGHT PERCUTANEOUS NEPHROSTOMY TUBE PLACEMENT. COMPARISON:  CT the abdomen pelvis - 01/26/2016; 01/12/2016; ultrasound and CT-guided suprapubic catheter placement - 09/23/2015 MEDICATIONS: The patient currently admitted to the hospital and receiving intravenous antibiotics;The antibiotic was administered in an appropriate time frame prior to skin puncture. ANESTHESIA/SEDATION: Fentanyl 150 mcg IV; Versed 3.5 mg IV Total Moderate Sedation Time 35 minutes. CONTRAST:  A total of 20 mL Isovue 300 administered into the bilateral collecting systems FLUOROSCOPY TIME:  3 minutes 18 seconds (672 mGy) COMPLICATIONS: None immediate. PROCEDURE: The procedure, risks, benefits, and alternatives were explained to the patient. Questions regarding the procedure were encouraged and answered. The patient understands and consents to the procedure. A timeout was performed prior to the initiation of the procedure. The bilateral flanks were prepped with Betadine in a sterile fashion, and a sterile drape was applied covering the operative field. A sterile gown and sterile gloves were used for the procedure. Local anesthesia was provided with 1% Lidocaine with epinephrine. Ultrasound was used to localize the right kidney. Under direct ultrasound  guidance, a 21 gauge needle was advanced into the renal collecting system. An ultrasound image documentation was performed. Access within the collecting system was confirmed with the efflux of urine followed by contrast injection. Over a Nitrex wire, the inner three [place] catheter of an Accustick set was advanced into the renal collecting system. Contrast injection was injected into the collecting system as several spot radiographs were obtained in various obliquities confirming puncture within a posterior inferior calix. As such, the tract was dilated with an Accustick stent. Over a guide wire, a 10-French percutaneous nephrostomy catheter was advanced into the collecting system where the coil was formed and locked. Contrast was injected and several sport radiographs were obtained in various obliquities confirming access. The catheter was secured at the skin with a Prolene retention suture and a gravity bag was placed. Attention was now paid towards placement of a left-sided percutaneous nephrostomy catheter. Local anesthesia was provided with 1% Lidocaine with epinephrine. Ultrasound was used to localize the left kidney. Under direct ultrasound guidance, a 21 gauge needle was advanced into the renal collecting system. An ultrasound image documentation was performed. Access within the collecting system was confirmed with the efflux of urine followed by contrast injection. Over a Nitrex wire, the inner three [place] catheter of an Accustick set was advanced into the renal collecting system. Contrast injection was injected into the collecting system as several spot radiographs were obtained in various obliquities confirming puncture within a posterior inferior calix. As such, the tract was dilated with an Accustick stent. Over a guide wire, a 10-French percutaneous nephrostomy catheter was advanced into the collecting system where the coil was formed and locked. Contrast was injected and several sport radiographs  were obtained in various obliquities confirming access. The catheter was secured at the skin with a Prolene retention suture and a gravity bag was placed. A dressing was placed. The patient tolerated procedure well without immediate postprocedural complication. FINDINGS: Ultrasound scanning demonstrates a mildly dilated right-sided renal collecting system with no significant dilatation of the left renal collecting system compatible the findings seen on preceding noncontrast abdominal CT. Under direct ultrasound guidance, a posterior inferior calix was targeted bilaterally allowing advancement of a 10-French percutaneous nephrostomy catheters bilaterally under intermittent fluoroscopic guidance. Contrast injection confirmed appropriate positioning. 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 IMPRESSION: 1. Successful ultrasound fluoroscopic guided placement of bilateral 10 French percutaneous nephrostomy catheters. 2. 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 PLAN: If patient is still having a significant output from his suprapubic catheter, this may be secondary to the superior moiety of the left renal collecting system and as such, the left-sided percutaneous nephrostomy catheter may be manipulated into the superior aspect of the left ureter to better drain both left-sided renal moieties. Electronically Signed   By: Sandi Mariscal M.D.   On: 01/27/2016 14:15   Ir Nephrostomy Placement Right  Result Date: 01/27/2016 INDICATION: History of rectal cancer, post APR with urinary bladder injury, post suprapubic catheter placement. Concern for persistent urinary leak and as such request made for placement of bilateral percutaneous nephrostomy catheters for urinary diversion purposes. EXAM: 1. ULTRASOUND GUIDANCE FOR PUNCTURE OF THE LEFT/RIGHT RENAL COLLECTING  SYSTEM 2. LEFT/RIGHT PERCUTANEOUS NEPHROSTOMY TUBE PLACEMENT. COMPARISON:  CT the abdomen pelvis - 01/26/2016; 01/12/2016; ultrasound and CT-guided suprapubic catheter placement - 09/23/2015 MEDICATIONS: The patient currently admitted to the hospital and receiving intravenous antibiotics;The antibiotic was administered in an appropriate time frame prior to skin puncture. ANESTHESIA/SEDATION: Fentanyl 150 mcg IV; Versed 3.5 mg IV Total Moderate Sedation Time 35 minutes. CONTRAST:  A total of 20 mL Isovue 300 administered into the bilateral collecting systems FLUOROSCOPY TIME:  3 minutes 18 seconds (562 mGy) COMPLICATIONS: None immediate. PROCEDURE: The procedure, risks, benefits, and alternatives were explained to the patient. Questions regarding the procedure were encouraged and answered. The patient understands and consents to the procedure. A timeout was performed prior to the initiation of the procedure. The bilateral flanks were prepped with Betadine in a sterile fashion, and a sterile drape was applied covering the operative field. A sterile gown and sterile gloves were used for the procedure. Local anesthesia was provided with 1% Lidocaine with epinephrine. Ultrasound was used to localize the right kidney. Under direct ultrasound guidance, a 21 gauge needle was advanced into the renal collecting system. An ultrasound image documentation was performed. Access within the collecting system was confirmed with the efflux of urine followed by contrast injection. Over a Nitrex wire, the inner three [place] catheter of an Accustick set was advanced into the renal collecting system. Contrast injection was injected into the collecting system as several spot radiographs were obtained in various obliquities confirming puncture within a posterior inferior calix. As such, the tract was dilated with an Accustick stent. Over a guide wire, a 10-French percutaneous nephrostomy catheter was advanced into the collecting system  where the coil was formed and locked. Contrast was injected and several sport radiographs were obtained in various obliquities confirming access. The catheter was secured at the skin with a Prolene retention suture and a gravity bag was placed. Attention was now paid towards placement of a left-sided percutaneous nephrostomy catheter. Local anesthesia was provided with 1% Lidocaine with epinephrine. Ultrasound was used to localize the left kidney. Under direct ultrasound guidance, a 21 gauge needle was advanced into the renal collecting system. An ultrasound image documentation was performed. Access within the collecting system was confirmed with the efflux of urine followed by contrast injection. Over a Nitrex wire, the inner three [place] catheter of an Accustick set was advanced into the renal collecting system. Contrast injection was injected into the collecting system as several spot radiographs were obtained in various obliquities confirming puncture within a posterior inferior calix. As such, the  tract was dilated with an Accustick stent. Over a guide wire, a 10-French percutaneous nephrostomy catheter was advanced into the collecting system where the coil was formed and locked. Contrast was injected and several sport radiographs were obtained in various obliquities confirming access. The catheter was secured at the skin with a Prolene retention suture and a gravity bag was placed. A dressing was placed. The patient tolerated procedure well without immediate postprocedural complication. FINDINGS: Ultrasound scanning demonstrates a mildly dilated right-sided renal collecting system with no significant dilatation of the left renal collecting system compatible the findings seen on preceding noncontrast abdominal CT. Under direct ultrasound guidance, a posterior inferior calix was targeted bilaterally allowing advancement of a 10-French percutaneous nephrostomy catheters bilaterally under intermittent fluoroscopic  guidance. Contrast injection confirmed appropriate positioning. 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 IMPRESSION: 1. Successful ultrasound fluoroscopic guided placement of bilateral 10 French percutaneous nephrostomy catheters. 2. 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 PLAN: If patient is still having a significant output from his suprapubic catheter, this may be secondary to the superior moiety of the left renal collecting system and as such, the left-sided percutaneous nephrostomy catheter may be manipulated into the superior aspect of the left ureter to better drain both left-sided renal moieties. Electronically Signed   By: Sandi Mariscal M.D.   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  History of partial nephrectomy in 2012 at [facility] in [place].  Treated with FOLFOX/AVASTIN and XRT for palliation.  Change in therapy to FOLFIRI/Avastin due to progression of pulmonary metastases.  He is S/P colostomy for palliation of poor bowel control and more recently a palliative APR by Dr. Arnoldo Morale; complicated by urethra leak and infection requiring bilateral percutaneous nephrostomy tubes placed by IR on 01/27/2016.  Oncology history updated.  Pre-treatment labs as ordered: CBC diff, CMET, CEA, and UA.  I personally reviewed and went over laboratory results with the patient.  The results are noted within this dictation.  He is scheduled for chemotherapy tomorrow, but he would like to delay it x 1 week, which I think is reasonable given recent hospitalization for UTI and ongoing antibiotic treatment.  Given recent Urostomy tube placement, will HOLD Avastin for his first cycle of therapy (after  conferring with Dr. [name]).  Treatment plan and antibody plan are manipulated accordingly.  Rx for Fentanyl and Dilaudid refills.  Of note, he has not been on Dilaudid (the patient reports) x 3 months.  [place] Controlled Substance Reporting System is reviewed and his last Fentanyl Rx was filled on 01/01/2016 and his last dilaudid was filled on 11/18/2015.  I have placed a call to Dr. Diona Fanti to confirm that he is agreeable to pursuing treatment as planned next week.  As of this time (5:26 PM) I have not received a return call.  I will call him back tomorrow.  Return in 1 week for chemotherapy.  Return in 3 weeks for follow-up with next cycle of chemotherapy.  Addendum: Dr. Diona Fanti call this AM (02/02/2016) to return my page.  He is agreeable to pursing systemic chemotherapy next week as planned.    ORDERS PLACED FOR THIS ENCOUNTER: No orders of the defined types were placed in this encounter.   MEDICATIONS PRESCRIBED THIS  ENCOUNTER: Meds ordered this encounter  Medications  . fentaNYL (DURAGESIC - DOSED MCG/HR) 75 MCG/HR    Sig: Place 2 patches (150 mcg total) onto the skin every 3 (three) days.    Dispense:  20 patch    Refill:  0    Order Specific Question:   Supervising Provider    Answer:   Patrici Ranks [id number]  . HYDROmorphone (DILAUDID) 4 MG tablet    Sig: Take 1 tablet (4 mg total) by mouth every 4 (four) hours as needed for severe pain.    Dispense:  60 tablet    Refill:  0    Order Specific Question:   Supervising Provider    Answer:   Patrici Ranks [id number]    THERAPY PLAN:  Will plan on restarting FOLFIRI/Avastin next week.  All questions were answered. The patient knows to call the clinic with any problems, questions or concerns. We can certainly see the patient much sooner if necessary.  Patient and plan discussed with Dr. Ancil Linsey and she is in agreement with the aforementioned.   This note is electronically signed by:  Doy Mince 02/02/2016 9:33 AM

## 2016-02-02 ENCOUNTER — Ambulatory Visit (HOSPITAL_COMMUNITY): Payer: Medicaid Other

## 2016-02-04 ENCOUNTER — Encounter (HOSPITAL_COMMUNITY): Payer: Medicaid Other

## 2016-02-09 ENCOUNTER — Encounter (HOSPITAL_BASED_OUTPATIENT_CLINIC_OR_DEPARTMENT_OTHER): Payer: Medicaid Other

## 2016-02-09 ENCOUNTER — Encounter (HOSPITAL_COMMUNITY): Payer: Self-pay

## 2016-02-09 ENCOUNTER — Ambulatory Visit (HOSPITAL_COMMUNITY): Payer: Medicaid Other

## 2016-02-09 VITALS — BP 144/65 | HR 90 | Temp 98.4°F | Resp 18 | Wt 198.0 lb

## 2016-02-09 DIAGNOSIS — R918 Other nonspecific abnormal finding of lung field: Secondary | ICD-10-CM | POA: Diagnosis present

## 2016-02-09 DIAGNOSIS — C20 Malignant neoplasm of rectum: Secondary | ICD-10-CM

## 2016-02-09 DIAGNOSIS — Z5111 Encounter for antineoplastic chemotherapy: Secondary | ICD-10-CM | POA: Diagnosis not present

## 2016-02-09 DIAGNOSIS — C7800 Secondary malignant neoplasm of unspecified lung: Secondary | ICD-10-CM | POA: Diagnosis not present

## 2016-02-09 LAB — CBC WITH DIFFERENTIAL/PLATELET
BASOS PCT: 0 %
Basophils Absolute: 0 10*3/uL (ref 0.0–0.1)
EOS ABS: 0.4 10*3/uL (ref 0.0–0.7)
Eosinophils Relative: 4 %
HEMATOCRIT: 42.2 % (ref 39.0–52.0)
Hemoglobin: 13.7 g/dL (ref 13.0–17.0)
LYMPHS ABS: 1.1 10*3/uL (ref 0.7–4.0)
Lymphocytes Relative: 11 %
MCH: 27.5 pg (ref 26.0–34.0)
MCHC: 32.5 g/dL (ref 30.0–36.0)
MCV: 84.7 fL (ref 78.0–100.0)
MONO ABS: 0.7 10*3/uL (ref 0.1–1.0)
MONOS PCT: 7 %
Neutro Abs: 7.4 10*3/uL (ref 1.7–7.7)
Neutrophils Relative %: 78 %
Platelets: 338 10*3/uL (ref 150–400)
RBC: 4.98 MIL/uL (ref 4.22–5.81)
RDW: 20 % — AB (ref 11.5–15.5)
WBC: 9.6 10*3/uL (ref 4.0–10.5)

## 2016-02-09 LAB — URINALYSIS, DIPSTICK ONLY
BILIRUBIN URINE: NEGATIVE
Glucose, UA: NEGATIVE mg/dL
Ketones, ur: NEGATIVE mg/dL
Nitrite: POSITIVE — AB
PROTEIN: 100 mg/dL — AB
SPECIFIC GRAVITY, URINE: 1.015 (ref 1.005–1.030)
pH: 6.5 (ref 5.0–8.0)

## 2016-02-09 LAB — COMPREHENSIVE METABOLIC PANEL
ALBUMIN: 3.5 g/dL (ref 3.5–5.0)
ALT: 13 U/L — ABNORMAL LOW (ref 17–63)
ANION GAP: 7 (ref 5–15)
AST: 18 U/L (ref 15–41)
Alkaline Phosphatase: 99 U/L (ref 38–126)
BILIRUBIN TOTAL: 0.2 mg/dL — AB (ref 0.3–1.2)
BUN: 8 mg/dL (ref 6–20)
CALCIUM: 8.8 mg/dL — AB (ref 8.9–10.3)
CO2: 24 mmol/L (ref 22–32)
Chloride: 102 mmol/L (ref 101–111)
Creatinine, Ser: 1.02 mg/dL (ref 0.61–1.24)
GFR calc Af Amer: >60 mL/min (ref >60)
GFR calc non Af Amer: >60 mL/min (ref >60)
GLUCOSE: 212 mg/dL — AB (ref 65–99)
Potassium: 3.5 mmol/L (ref 3.5–5.1)
SODIUM: 133 mmol/L — AB (ref 135–145)
TOTAL PROTEIN: 7.4 g/dL (ref 6.5–8.1)

## 2016-02-09 MED ORDER — SODIUM CHLORIDE 0.9 % IV SOLN
Freq: Once | INTRAVENOUS | Status: AC
  Administered 2016-02-09: 10:00:00 via INTRAVENOUS

## 2016-02-09 MED ORDER — LEUCOVORIN CALCIUM INJECTION 350 MG
400.0000 mg/m2 | Freq: Once | INTRAVENOUS | Status: AC
  Administered 2016-02-09: 776 mg via INTRAVENOUS
  Filled 2016-02-09: qty 38.8

## 2016-02-09 MED ORDER — IRINOTECAN HCL CHEMO INJECTION 100 MG/5ML
180.0000 mg/m2 | Freq: Once | INTRAVENOUS | Status: AC
  Administered 2016-02-09: 340 mg via INTRAVENOUS
  Filled 2016-02-09: qty 17

## 2016-02-09 MED ORDER — SODIUM CHLORIDE 0.9 % IV SOLN
2400.0000 mg/m2 | INTRAVENOUS | Status: DC
  Administered 2016-02-09: 4650 mg via INTRAVENOUS
  Filled 2016-02-09: qty 93

## 2016-02-09 MED ORDER — PALONOSETRON HCL INJECTION 0.25 MG/5ML
INTRAVENOUS | Status: AC
  Filled 2016-02-09: qty 5

## 2016-02-09 MED ORDER — ATROPINE SULFATE 1 MG/ML IJ SOLN
0.5000 mg | Freq: Once | INTRAMUSCULAR | Status: AC | PRN
  Administered 2016-02-09: 0.5 mg via INTRAVENOUS
  Filled 2016-02-09: qty 1

## 2016-02-09 MED ORDER — HEPARIN SOD (PORK) LOCK FLUSH 100 UNIT/ML IV SOLN: 500.0000 [IU] | Freq: Once | INTRAVENOUS | Status: DC | PRN

## 2016-02-09 MED ORDER — DEXAMETHASONE SODIUM PHOSPHATE 100 MG/10ML IJ SOLN
10.0000 mg | Freq: Once | INTRAMUSCULAR | Status: AC
  Administered 2016-02-09: 10 mg via INTRAVENOUS
  Filled 2016-02-09: qty 1

## 2016-02-09 MED ORDER — SODIUM CHLORIDE 0.9% FLUSH
10.0000 mL | INTRAVENOUS | Status: DC | PRN
  Administered 2016-02-09: 10 mL
  Filled 2016-02-09: qty 10

## 2016-02-09 MED ORDER — PALONOSETRON HCL INJECTION 0.25 MG/5ML
0.2500 mg | Freq: Once | INTRAVENOUS | Status: AC
  Administered 2016-02-09: 0.25 mg via INTRAVENOUS

## 2016-02-09 NOTE — Patient Instructions (Signed)
[facility] Discharge Instructions for Patients Receiving Chemotherapy   Beginning January 23rd 2017 lab work for the [facility] will be done in the  Main lab at [facility] on 1st floor. If you have a lab appointment with the [facility] please come in thru the  Main Entrance and check in at the main information desk   Today you received the following chemotherapy agents Leucovorin, Irinotecan, 5FU  To help prevent nausea and vomiting after your treatment, we encourage you to take your nausea medication     If you develop nausea and vomiting, or diarrhea that is not controlled by your medication, call the clinic.  The clinic phone number is [phone]. Office hours are Monday-Friday 8:30am-5:00pm.  BELOW ARE SYMPTOMS THAT SHOULD BE REPORTED IMMEDIATELY:  *FEVER GREATER THAN 101.0 F  *CHILLS WITH OR WITHOUT FEVER  NAUSEA AND VOMITING THAT IS NOT CONTROLLED WITH YOUR NAUSEA MEDICATION  *UNUSUAL SHORTNESS OF BREATH  *UNUSUAL BRUISING OR BLEEDING  TENDERNESS IN MOUTH AND THROAT WITH OR WITHOUT PRESENCE OF ULCERS  *URINARY PROBLEMS  *BOWEL PROBLEMS  UNUSUAL RASH Items with * indicate a potential emergency and should be followed up as soon as possible. If you have an emergency after office hours please contact your primary care physician or go to the nearest emergency department.  Please call the clinic during office hours if you have any questions or concerns.   You may also contact the Patient Navigator at [phone] should you have any questions or need assistance in obtaining follow up care.      Resources For Cancer Patients and their Caregivers ? American Cancer Society: Can assist with transportation, wigs, general needs, runs Look Good Feel Better.        808-269-2018 ? Cancer Care: Provides financial assistance, online support groups, medication/co-pay assistance.  1-800-813-HOPE 351-633-9456) ? [facility] Assists [place] cancer patients and their families through emotional , educational and financial support.  667-247-0344 ? [place] DSS Where to apply for food stamps, Medicaid and utility assistance. 406-167-0945 ? [employer]: Transportation to medical appointments. 251-555-7631 ? Social Security Administration: May apply for disability if have a Stage IV cancer. 5060781534 [phone])660-9311 ? [employer], Disability and Transit Services: Assists with nutrition, care and transit needs. 367-610-0451

## 2016-02-09 NOTE — Progress Notes (Signed)
Labs reviewed with MD. Will proceed with treatment and hold the Avastin until further notice per MD  Chemotherapy given today. Vitals stable, discharged home ambulatory from clinic with continuous 5FU pump.

## 2016-02-10 LAB — CEA: CEA: 6 ng/mL — ABNORMAL HIGH (ref 0.0–4.7)

## 2016-02-11 ENCOUNTER — Encounter (HOSPITAL_COMMUNITY): Payer: Medicaid Other

## 2016-02-11 ENCOUNTER — Encounter (HOSPITAL_COMMUNITY): Payer: Self-pay

## 2016-02-11 ENCOUNTER — Encounter (HOSPITAL_BASED_OUTPATIENT_CLINIC_OR_DEPARTMENT_OTHER): Payer: Medicaid Other

## 2016-02-11 VITALS — BP 128/63 | HR 83 | Temp 98.2°F | Resp 18 | Wt 202.4 lb

## 2016-02-11 DIAGNOSIS — C20 Malignant neoplasm of rectum: Secondary | ICD-10-CM

## 2016-02-11 DIAGNOSIS — C7800 Secondary malignant neoplasm of unspecified lung: Secondary | ICD-10-CM

## 2016-02-11 DIAGNOSIS — Z452 Encounter for adjustment and management of vascular access device: Secondary | ICD-10-CM | POA: Diagnosis not present

## 2016-02-11 MED ORDER — SODIUM CHLORIDE 0.9% FLUSH
10.0000 mL | INTRAVENOUS | Status: DC | PRN
  Administered 2016-02-11: 20 mL
  Filled 2016-02-11: qty 10

## 2016-02-11 MED ORDER — HEPARIN SOD (PORK) LOCK FLUSH 100 UNIT/ML IV SOLN
500.0000 [IU] | Freq: Once | INTRAVENOUS | Status: AC | PRN
  Administered 2016-02-11: 500 [IU]

## 2016-02-11 MED ORDER — HEPARIN SOD (PORK) LOCK FLUSH 100 UNIT/ML IV SOLN
INTRAVENOUS | Status: AC
  Filled 2016-02-11: qty 5

## 2016-02-11 NOTE — Patient Instructions (Signed)
[facility] at [facility] Discharge Instructions  RECOMMENDATIONS MADE BY THE CONSULTANT AND ANY TEST RESULTS WILL BE SENT TO YOUR REFERRING PHYSICIAN.  Continuous 5FU pump disconnected today Port flushed, follow up as scheduled.  Thank you for choosing [facility] at [facility] to provide your oncology and hematology care.  To afford each patient quality time with our provider, please arrive at least 15 minutes before your scheduled appointment time.   Beginning January 23rd 2017 lab work for the [facility] will be done in the  Main lab at [facility] on 1st floor. If you have a lab appointment with the [facility] please come in thru the  Main Entrance and check in at the main information desk  You need to re-schedule your appointment should you arrive 10 or more minutes late.  We strive to give you quality time with our providers, and arriving late affects you and other patients whose appointments are after yours.  Also, if you no show three or more times for appointments you may be dismissed from the clinic at the providers discretion.     Again, thank you for choosing [facility].  Our hope is that these requests will decrease the amount of time that you wait before being seen by our physicians.       _____________________________________________________________  Should you have questions after your visit to [facility], please contact our office at [phone] between the hours of 8:30 a.m. and 4:30 p.m.  Voicemails left after 4:30 p.m. will not be returned until the following business day.  For prescription refill requests, have your pharmacy contact our office.         Resources For Cancer Patients and their Caregivers ? American Cancer Society: Can assist with transportation, wigs, general needs, runs Look Good Feel Better.        913-085-2944 ? Cancer Care: Provides financial assistance, online  support groups, medication/co-pay assistance.  1-800-813-HOPE (941)821-4033) ? [facility] Assists [place] cancer patients and their families through emotional , educational and financial support.  878-787-9371 ? [place] DSS Where to apply for food stamps, Medicaid and utility assistance. 430-526-3763 ? [employer]: Transportation to medical appointments. 614-180-5437 ? Social Security Administration: May apply for disability if have a Stage IV cancer. 606-272-2655 (279)185-7613 ? [employer], Disability and Transit Services: Assists with nutrition, care and transit needs. [place] Support Programs: '@10RELATIVEDAYS'$ @ > Cancer Support Group  2nd Tuesday of the month 1pm-2pm, Journey Room  > Creative Journey  3rd Tuesday of the month 1130am-1pm, Journey Room  > Look Good Feel Better  1st Wednesday of the month 10am-12 noon, Journey Room (Call [place] to register [phone])

## 2016-02-11 NOTE — Progress Notes (Signed)
Andrew Kline returns today for port deaccess and flush after 46 hr continous infusion of 73f. Tolerated infusion without problems. Portacath located rightchest wall was  deaccessed and flushed with 220mNS and 500U/73m67meparin and needle removed intact.  Procedure without incident. Patient tolerated procedure well.   Vitals stable and discharged ambulatory from clinic. Follow up as scheduled.

## 2016-02-14 ENCOUNTER — Encounter (HOSPITAL_COMMUNITY): Payer: Self-pay | Admitting: Hematology & Oncology

## 2016-02-15 ENCOUNTER — Telehealth (HOSPITAL_COMMUNITY): Payer: Self-pay | Admitting: Oncology

## 2016-02-15 NOTE — Telephone Encounter (Signed)
Refaxed zolpidem ER PA to [place]. Per Virgilio Frees they have an PA on file for the zolpidem(not ER). Call ref# [id number]

## 2016-02-22 ENCOUNTER — Encounter (HOSPITAL_BASED_OUTPATIENT_CLINIC_OR_DEPARTMENT_OTHER): Payer: Medicaid Other | Admitting: Oncology

## 2016-02-22 ENCOUNTER — Encounter (HOSPITAL_COMMUNITY): Payer: Self-pay | Admitting: Oncology

## 2016-02-22 DIAGNOSIS — C20 Malignant neoplasm of rectum: Secondary | ICD-10-CM

## 2016-02-22 DIAGNOSIS — K6289 Other specified diseases of anus and rectum: Secondary | ICD-10-CM

## 2016-02-22 DIAGNOSIS — C7800 Secondary malignant neoplasm of unspecified lung: Secondary | ICD-10-CM | POA: Diagnosis not present

## 2016-02-22 MED ORDER — HYDROMORPHONE HCL 4 MG PO TABS: 4.0000 mg | ORAL_TABLET | ORAL | 0 refills | Status: DC | PRN

## 2016-02-22 NOTE — Progress Notes (Signed)
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       09/25/2014 Imaging    CT abd/pelvis- Indeterminate nodules within the bilateral lung bases with dominant approximately 1.7 cm centrally necrotic nodule within the right lower lobe...      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      10/03/2014 Tumor Marker    CEA NOT ELEVATED      10/09/2014 Imaging    CT Chest- Bilateral pulmonary nodules of varying size are most consistent with pulmonary metastasis. There are approximately 6 lesions in each lung.      10/17/2014 Pathology Results    Rectum, biopsy - INVASIVE ADENOCARCINOMA      10/29/2014 Pathology Results    Diagnosis Lung, needle/core biopsy(ies), right lower lobe - METASTATIC ADENOCARCINOMA      10/29/2014 Pathology Results    KRAS wildtype      11/04/2014 - 12/18/2014 Radiation Therapy    Dr. Lisbeth Renshaw      11/05/2014 - 05/26/2015 Chemotherapy    FOLFOX + Avastin      02/11/2015 Surgery    Colostomy formation by Dr. Arnoldo Morale.      02/26/2015 Imaging    CT CAP- Interval response to therapy with decrease in size of multiple bilateral pulmonary nodules. No new or progressive pulmonary metastasis.  No evidence for metastatic disease within the abdomen or pelvis.       03/22/2015 Imaging    CT RENAL STONE- There is an obstructing 4 mm stone within the distal left ureter resulting in moderate left hydroureteronephrosis.  Re- demonstrated pulmonary nodules within the right  lower lobe and lingula, compatible with known metastasis.      05/20/2015 Progression    CT Scans demonstrate pulmonary progression of disease.      05/20/2015 Imaging    CT CAP- Mild progression of bilateral pulmonary metastases, including a 1.9 cm cavitary nodule in the right lower lobe with increasing solid component.      06/23/2015 - 07/21/2015 Chemotherapy    FOLFIRI + Avastin      08/26/2015 Surgery    Abdominoperineal resection (palliative)      08/26/2015 Pathology Results    microscopic focus of adenocarcinoma of colon, tumor invades submucosa      09/23/2015 Procedure     Temporary Suprapubic catheter placement IR secondary to small knick in urethra during APR, urinary retention      11/04/2015 Imaging    CT CAP- Presacral collection of fluid and air is new from 05/20/2015 and worrisome for an abscess in this patient status post abdominal perineal resection on 08/26/2015.  Worsening pulmonary metastatic disease. Chronic calcific pancreatitis.      11/04/2015 Progression    CT imaging demonstrating progression of pulmonary nodules      11/04/2015 Imaging    CT C/A/P with presacral collection of fluid and air new worrisome for abscess, worsening pulmonary metastatic disease, chronic calcific pancreatitis      11/12/2015 Procedure    Successful CT-guided  left trans gluteal pelvic abscess drain insertion and successful CT-guided 12 French suprapubic catheter exchange by IR      01/13/2016 Imaging    CT CAP- 1. Enlarging bilateral pulmonary nodules compatible with progressive metastatic disease. Some of these nodules are cavitary. 2. Right hydronephrosis and hydroureter due to a distal stricture or regional wall thickening in the right distal ureter starting just past the iliac vessel cross over. This results in delayed excretion on the right, and considerable right hydronephrosis. In the area of involvement, the ureter does hurt the margin of the presacral soft tissue  density. 3. Increase in presacral soft tissue density noted. Reduced size of the internal fluid collection within this soft tissue density, currently 27 cc. I suspect that this collection may be extending anteriorly along its lower margin and possibly connecting to the prostatic urethra based on how for forwarded extends and based on the history of a brown discharge from the penis. The prior gas in this collection is no longer present but residual infection is not entirely excluded. The presacral soft tissue density does extend to the posterior bladder margin but I do not see any posterior leak of contrast from the urinary bladder or ureters on the delayed postvoid images. A suprapubic catheter is in place. 4. Left colostomy with peristomal hernia containing several loops of small bowel without complicating feature. Mild prominence of stool in the remaining colon.      01/13/2016 Progression    CT scans demonstrate progression of pulmonary nodules.      01/25/2016 - 01/31/2016 Hospital Admission    Admit date: 01/25/2016 Admission diagnosis: UTI Additional comments: B/L percutaneous nephrostomy tubes placed.      01/26/2016 Imaging    CT abd/pelvis WO contrast- 1. No clear explanation for RIGHT-sided pain. No significant change from CT 2 weeks prior. 2. Mild RIGHT hydroureter and hydronephrosis is similar to comparison exam. 3. Suprapubic catheter within a collapsed bladder. 4. Stable rounded fluid collection in the presacral space. 5. Long segment of small bowel enters a LEFT lower quadrant peristomal hernia. No evidence of bowel obstruction. 6. Pulmonary metastasis.      01/27/2016 Procedure    B/L percutaneous nephostomy tubes by IR       HISTORY OF PRESENTING ILLNESS:  Andrew Kline 50 y.o. male is here because of stage IV rectal cancer.  He has undergone a palliative APR, unfortunately had several complications but is now on the mend.  Tolerated first chemotherapy very  well without any nausea vomiting diarrhea.  Patient had a nephrostomy tube placed and not draining any urine from suprapubic catheter. No significant difference in the pain level. Recent is also being seen by surgeon for incisional hernia  MEDICAL HISTORY:  Past Medical History:  Diagnosis Date  . Anxiety   . Arthritis   . Depression   . Kidney stone 03/22/15   left  . Rectal cancer ([place])   . Rectal pain   . Renal cell cancer ([place])    2012.   [name] Shortness of breath dyspnea     SURGICAL HISTORY: Past Surgical History:  Procedure Laterality Date  . BIOPSY N/A 09/30/2014   Procedure: BIOPSY;  Surgeon: Danie Binder, MD;  Location: [facility];  Service: Endoscopy;  Laterality: N/A;  Anal Canal  . COLOSTOMY N/A 02/11/2015   Procedure: COLOSTOMY;  Surgeon: Aviva Signs, MD;  Location: [facility]S;  Service: General;  Laterality: N/A;  . FLEXIBLE SIGMOIDOSCOPY N/A 09/30/2014   Procedure: FLEXIBLE SIGMOIDOSCOPY;  Surgeon: Carlyon Prows  Rexene Edison, MD;  Location: [facility]S;  Service: Endoscopy;  Laterality: N/A;  . FLEXIBLE SIGMOIDOSCOPY N/A 10/17/2014   Procedure: FLEXIBLE SIGMOIDOSCOPY;  Surgeon: Danie Binder, MD;  Location: [facility] END[facility] SUITE;  Service: Endoscopy;  Laterality: N/A;  1325  . HYPOSPADIAS CORRECTION    . IR GENERIC HISTORICAL  12/17/2015   IR CATHETER TUBE CHANGE 12/17/2015 [facility]-INTERV RAD  . IR GENERIC HISTORICAL  01/27/2016   IR NEPHROSTOMY PLACEMENT RIGHT 01/27/2016 [facility]-INTERV RAD  . IR GENERIC HISTORICAL  01/27/2016   IR NEPHROSTOMY PLACEMENT LEFT 01/27/2016 [facility]INTER[facility] RAD  . KNEE SURGERY Left    arthroscopy  . LAPAROSCOPIC PARTIAL NEPHRECTOMY  2012   right side  . LUNG BIOPSY Right   . PERINEAL PROCTECTOMY N/A 08/26/2015   Procedure:  PROCTECTOMY;  Surgeon: Aviva Signs, MD;  Location: [facility]S;  Service: General;  Laterality: N/A;  . RECTAL SURGERY      SOCIAL HISTORY: Social History   Social History  . Marital status: Divorced    Spouse name: N/A  . Number of children: 2  . Years of  education: N/A   Occupational History  . retail    Social History Main Topics  . Smoking status: Former Smoker    Packs/day: 0.50    Years: 30.00    Types: Cigarettes  . Smokeless tobacco: Never Used     Comment: a little over a pack daily  . Alcohol use 0.0 oz/week     Comment: Occ  . Drug use: No  . Sexual activity: Yes    Birth control/ protection: None   Other Topics Concern  . Not on file   Social History Narrative  . No narrative on file  He is from [place], [place]. Moved here in March. Smoker, 1 ppd. Was 1.5 ppd. ETOH, none. 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 He is divorced. Lives with fiance. 2 children. 42 and 67 yo. No grandchildren.  FAMILY HISTORY: Family History  Problem Relation Age of Onset  . Healthy Mother   . Lung cancer Maternal Grandfather   . Colon cancer Maternal Uncle     age 57  . Diabetes Other    indicated that his mother is alive. He indicated that his father is alive. He indicated that the status of his maternal grandfather is unknown. He indicated that the status of his maternal uncle is unknown. He indicated that the status of his other is unknown.    Mother living, 61 yo. Still working as a Audiological scientist. Father living, 42-70 yo. 1 brother, 1 sister, 1 step sister. Maternal uncle has colon cancer. He is married to a GI nurse. So he has people to go to with questions and is informed on what is happening. History of diabetes in the family. No history of heart disease in the family.  ALLERGIES:  is allergic to oxaliplatin.  MEDICATIONS:  Current Outpatient Prescriptions  Medication Sig Dispense Refill  . albuterol (PROVENTIL HFA;VENTOLIN HFA) 108 (90 Base) MCG/ACT inhaler Inhale 2 puffs into the lungs every 6 (six) hours as needed for wheezing or shortness of breath. 1 Inhaler 2  . amitriptyline (ELAVIL) 50 MG tablet Take 1 tablet (50  mg total) by mouth at bedtime. 30 tablet 3  . ciprofloxacin (CIPRO) 500 MG tablet Take 1 tablet (500 mg total) by mouth 2 (two) times daily. 10 tablet 0  . docusate sodium (COLACE) 100 MG capsule  Take 1 capsule (100 mg total) by mouth every 12 (twelve) hours. (Patient taking differently: Take 100 mg by mouth daily as needed for mild constipation or moderate constipation. ) 60 capsule 0  . DULoxetine (CYMBALTA) 60 MG capsule Take 1 capsule (60 mg total) by mouth daily. 30 capsule 3  . fentaNYL (DURAGESIC - DOSED MCG/HR) 75 MCG/HR Place 2 patches (150 mcg total) onto the skin every 3 (three) days. 20 patch 0  . HYDROmorphone (DILAUDID) 4 MG tablet Take 1 tablet (4 mg total) by mouth every 4 (four) hours as needed for severe pain. 60 tablet 0  . pregabalin (LYRICA) 75 MG capsule Take 2 capsules (150 mg total) by mouth 2 (two) times daily. 120 capsule 3  . zolpidem (AMBIEN CR) 12.5 MG CR tablet Take 1 tablet (12.5 mg total) by mouth at bedtime as needed for sleep. 30 tablet 0   No current facility-administered medications for this visit.    Facility-Administered Medications Ordered in Other Visits  Medication Dose Route Frequency Provider Last Rate Last Dose  . 0.9 %  sodium chloride infusion   Intravenous Continuous Patrici Ranks, MD   Stopped at 08/26/15 1435    Review of Systems  Constitutional: Positive for malaise/fatigue HENT: Negative. Respiratory: Negative. Gastrointestinal:  Negative for blood in stool.  Genitourinary: Positive for groin pain  Psychiatric/Behavioral: Positive for ANXIETY. 14 point ROS was done and is otherwise as detailed above or in HPI   PHYSICAL EXAMINATION:  ECOG PERFORMANCE STATUS: 1 - Symptomatic but completely ambulatory    Physical Exam  Constitutional: He is oriented to person, place, and time and well-developed,  and in no distress. Ambulates with use of cane. Actually appears comfortable today HENT:  Head: Normocephalic and atraumatic.  Nose:  Nose normal.  Mouth/Throat: Oropharynx is clear and moist. No oropharyngeal exudate.  Dentures on top.  Eyes: Conjunctivae and EOM are normal. Pupils are equal, round, and reactive to light. Right eye exhibits no discharge. Left eye exhibits no discharge. No scleral icterus.  Neck: Normal range of motion. Neck supple. No tracheal deviation present. No thyromegaly present.  Cardiovascular: Normal rate, regular rhythm and normal heart sounds.  Exam reveals no gallop and no friction rub.   No murmur heard. Pulmonary/Chest: Normal Abdominal: Soft. Bowel sounds are normal. He exhibits no distension and no mass. There is no tenderness. There is no rebound and no guarding.  Patient has significant incisional hernia (at the site of ostomy spell and Ostomy is c/d/i  Suprapubic catheter Genitourinary:   Musculoskeletal: Normal range of motion. He exhibits no edema.  Lymphadenopathy:    He has no cervical adenopathy.  Neurological: He is alert and oriented to person, place, and time. He has normal reflexes. No cranial nerve deficit. Gait normal. Coordination normal.  Skin: Skin is warm and dry. No rash noted.  Psychiatric: Mood, memory, affect and judgment normal.  Nursing note and vitals reviewed.   LABORATORY DATA:  I have reviewed the data as listed Results for ADRIANO, BISCHOF (MRN [id number])   Patient will get his lab prior to chemotherapy tomorrow   RADIOLOGY: I have personally reviewed the radiological images as listed and agreed with the findings in the report.  Study Result     CLINICAL DATA: Restaging stage IV colorectal carcinoma. Ureter lacerated during surgery, status post repair.  EXAM: CT CHEST, ABDOMEN, AND PELVIS WITH CONTRAST  TECHNIQUE: Multidetector CT imaging of the chest, abdomen and pelvis was performed following the standard protocol during bolus administration  of intravenous contrast.  CONTRAST: 123m ISOVUE-300 IOPAMIDOL (ISOVUE-300) INJECTION  61%  COMPARISON: CT chest abdomen pelvis 05/20/2015.  FINDINGS: CT CHEST FINDINGS  Mediastinum/Lymph Nodes: Right IJ Port-A-Cath terminates in the low SVC. Mediastinal lymph nodes are not enlarged by CT size criteria. No hilar or axillary adenopathy. Coronary artery calcification. Heart size normal. No pericardial effusion.  Lungs/Pleura: There are new and enlarging irregular nodules in the lungs. Index new nodule in the right lower lobe measures 1.5 cm (series 5, image 88). No pleural fluid. Airway is unremarkable.  Musculoskeletal: No worrisome lytic or sclerotic lesions.  CT ABDOMEN PELVIS FINDINGS  Hepatobiliary: Liver and gallbladder are unremarkable. No biliary ductal dilatation.  Pancreas: Calcifications are seen throughout pancreas.  Spleen: Negative.  Adrenals/Urinary Tract: Adrenal glands are unremarkable. Question postoperative changes along the lower pole right kidney. Kidneys are otherwise unremarkable. Ureters are decompressed.  Stomach/Bowel: Stomach, small bowel and majority of the colon are unremarkable. Abdominal perineal resection with a left lower quadrant colostomy and a parastomal hernia containing unobstructed small bowel. A collection of fluid and air is seen in the presacral space, measuring 5.0 x 5.4 cm, new from 05/20/2015. On nephrographic phase imaging, there is no increase in attenuation within the fluid collection.  Vascular/Lymphatic: Atherosclerotic calcification of the arterial vasculature without abdominal aortic aneurysm. No pathologically enlarged lymph nodes.  Reproductive: Prostate is visualized.  Other: Presacral collection of fluid and air is discussed above. Mesenteries and peritoneum are otherwise unremarkable. Small left inguinal hernia contains fat.  Musculoskeletal: No worrisome lytic or sclerotic lesions.  IMPRESSION: 1. Presacral collection of fluid and air is new from 05/20/2015 and worrisome for an  abscess in this patient status post abdominal perineal resection on 08/26/2015. Urinoma is not excluded. These results will be called to the ordering clinician or representative by the Radiologist Assistant, and communication documented in the PACS or zVision Dashboard. 2. Worsening pulmonary metastatic disease. 3. Coronary artery calcification. 4. Chronic calcific pancreatitis. 5. Aortic atherosclerosis.   Electronically Signed  By: MLorin PicketM.D.  On: 11/04/2015 14:35        ASSESSMENT & PLAN:  STAGE IV Rectal vs. Anal carcinoma, adenocarcinoma Pulmonary metastases Good PS [facility] for palliation Colostomy for palliation of poor bowel control Lyrica for pain, with marked improvement in QOL and pain scores Contact Dermatitis from Fentanyl patch Weight loss Bronchitis -- resolved FOLFIRI/AVASTIN S/P palliative APR TEMP suprapubic catheter  Pre-sacral fluid collection vs. abscess  All lab data would be reviewed if patient meets parametrial will continue chemotherapy.  He Avastin has been added the during this treatment. If patient is planning to get any surgical intervention if Avastin will be held for 4 weeks

## 2016-02-22 NOTE — Patient Instructions (Addendum)
[place] at [facility] Discharge Instructions  RECOMMENDATIONS MADE BY THE CONSULTANT AND ANY TEST RESULTS WILL BE SENT TO YOUR REFERRING PHYSICIAN.  You saw Dr. Oliva Bustard today instead of Dr. [name]. Continue Chemo as planned. Follow up with labs and chemo in 2 weeks.  Thank you for choosing [place] at [facility] to provide your oncology and hematology care.  To afford each patient quality time with our provider, please arrive at least 15 minutes before your scheduled appointment time.   Beginning January 23rd 2017 lab work for the [facility] will be done in the  Main lab at [facility] on 1st floor. If you have a lab appointment with the [facility] please come in thru the  Main Entrance and check in at the main information desk  You need to re-schedule your appointment should you arrive 10 or more minutes late.  We strive to give you quality time with our providers, and arriving late affects you and other patients whose appointments are after yours.  Also, if you no show three or more times for appointments you may be dismissed from the clinic at the providers discretion.     Again, thank you for choosing [facility].  Our hope is that these requests will decrease the amount of time that you wait before being seen by our physicians.       _____________________________________________________________  Should you have questions after your visit to [facility], please contact our office at [phone] between the hours of 8:30 a.m. and 4:30 p.m.  Voicemails left after 4:30 p.m. will not be returned until the following business day.  For prescription refill requests, have your pharmacy contact our office.         Resources For Cancer Patients and their Caregivers ? American Cancer Society: Can assist with transportation, wigs, general needs, runs Look Good Feel Better.        ([phone] ? Cancer  Care: Provides financial assistance, online support groups, medication/co-pay assistance.  1-800-813-HOPE 757 827 1593) ? [place] Assists [place] cancer patients and their families through emotional , educational and financial support.  828 750 7114 ? [place] DSS Where to apply for food stamps, Medicaid and utility assistance. 305-235-6691 ? RCATS: Transportation to medical appointments. (269) 301-0124 ? Social Security Administration: May apply for disability if have a Stage IV cancer. 250-211-1145 408-275-4119 ? [employer], Disability and Transit Services: Assists with nutrition, care and transit needs. [place] Support Programs: '@10RELATIVEDAYS'$ @ > Cancer Support Group  2nd Tuesday of the month 1pm-2pm, Journey Room  > Creative Journey  3rd Tuesday of the month 1130am-1pm, Journey Room  > Look Good Feel Better  1st Wednesday of the month 10am-12 noon, Journey Room (Call [place] to register [phone])

## 2016-02-23 ENCOUNTER — Ambulatory Visit (INDEPENDENT_AMBULATORY_CARE_PROVIDER_SITE_OTHER): Payer: Medicaid Other | Admitting: Urology

## 2016-02-23 ENCOUNTER — Encounter (HOSPITAL_COMMUNITY): Payer: Medicaid Other | Attending: Oncology

## 2016-02-23 VITALS — BP 146/86 | HR 88 | Temp 97.9°F | Resp 18 | Wt 204.2 lb

## 2016-02-23 DIAGNOSIS — N360 Urethral fistula: Secondary | ICD-10-CM

## 2016-02-23 DIAGNOSIS — Z5112 Encounter for antineoplastic immunotherapy: Secondary | ICD-10-CM

## 2016-02-23 DIAGNOSIS — Z5111 Encounter for antineoplastic chemotherapy: Secondary | ICD-10-CM

## 2016-02-23 DIAGNOSIS — C7800 Secondary malignant neoplasm of unspecified lung: Secondary | ICD-10-CM | POA: Diagnosis present

## 2016-02-23 DIAGNOSIS — C20 Malignant neoplasm of rectum: Secondary | ICD-10-CM | POA: Diagnosis not present

## 2016-02-23 LAB — URINALYSIS, DIPSTICK ONLY
BILIRUBIN URINE: NEGATIVE
Glucose, UA: NEGATIVE mg/dL
KETONES UR: NEGATIVE mg/dL
NITRITE: NEGATIVE
PROTEIN: 100 mg/dL — AB
SPECIFIC GRAVITY, URINE: 1.015 (ref 1.005–1.030)
pH: 8 (ref 5.0–8.0)

## 2016-02-23 LAB — COMPREHENSIVE METABOLIC PANEL
ALT: 17 U/L (ref 17–63)
AST: 18 U/L (ref 15–41)
Albumin: 2.9 g/dL — ABNORMAL LOW (ref 3.5–5.0)
Alkaline Phosphatase: 87 U/L (ref 38–126)
Anion gap: 4 — ABNORMAL LOW (ref 5–15)
BILIRUBIN TOTAL: 0.3 mg/dL (ref 0.3–1.2)
BUN: 9 mg/dL (ref 6–20)
CHLORIDE: 113 mmol/L — AB (ref 101–111)
CO2: 21 mmol/L — ABNORMAL LOW (ref 22–32)
Calcium: 7.3 mg/dL — ABNORMAL LOW (ref 8.9–10.3)
Creatinine, Ser: 0.73 mg/dL (ref 0.61–1.24)
Glucose, Bld: 201 mg/dL — ABNORMAL HIGH (ref 65–99)
Potassium: 3.1 mmol/L — ABNORMAL LOW (ref 3.5–5.1)
Sodium: 138 mmol/L (ref 135–145)
TOTAL PROTEIN: 5.8 g/dL — AB (ref 6.5–8.1)

## 2016-02-23 LAB — CBC WITH DIFFERENTIAL/PLATELET
Basophils Absolute: 0 10*3/uL (ref 0.0–0.1)
Basophils Relative: 1 %
EOS PCT: 6 %
Eosinophils Absolute: 0.3 10*3/uL (ref 0.0–0.7)
HEMATOCRIT: 37.5 % — AB (ref 39.0–52.0)
Hemoglobin: 12 g/dL — ABNORMAL LOW (ref 13.0–17.0)
LYMPHS ABS: 0.8 10*3/uL (ref 0.7–4.0)
LYMPHS PCT: 13 %
MCH: 27.8 pg (ref 26.0–34.0)
MCHC: 32 g/dL (ref 30.0–36.0)
MCV: 87 fL (ref 78.0–100.0)
MONO ABS: 0.7 10*3/uL (ref 0.1–1.0)
Monocytes Relative: 12 %
Neutro Abs: 4.1 10*3/uL (ref 1.7–7.7)
Neutrophils Relative %: 68 %
PLATELETS: 253 10*3/uL (ref 150–400)
RBC: 4.31 MIL/uL (ref 4.22–5.81)
RDW: 20 % — AB (ref 11.5–15.5)
WBC: 6 10*3/uL (ref 4.0–10.5)

## 2016-02-23 MED ORDER — SODIUM CHLORIDE 0.9 % IV SOLN: 10.0000 mg | Freq: Once | INTRAVENOUS | Status: DC

## 2016-02-23 MED ORDER — LEUCOVORIN CALCIUM INJECTION 350 MG
400.0000 mg/m2 | Freq: Once | INTRAVENOUS | Status: AC
  Administered 2016-02-23: 776 mg via INTRAVENOUS
  Filled 2016-02-23: qty 38.8

## 2016-02-23 MED ORDER — SODIUM CHLORIDE 0.9 % IV SOLN
Freq: Once | INTRAVENOUS | Status: AC
  Administered 2016-02-23: 10:00:00 via INTRAVENOUS

## 2016-02-23 MED ORDER — SODIUM CHLORIDE 0.9% FLUSH: 10.0000 mL | INTRAVENOUS | Status: DC | PRN

## 2016-02-23 MED ORDER — HEPARIN SOD (PORK) LOCK FLUSH 100 UNIT/ML IV SOLN: 500.0000 [IU] | Freq: Once | INTRAVENOUS | Status: DC | PRN

## 2016-02-23 MED ORDER — SODIUM CHLORIDE 0.9 % IV SOLN
5.0000 mg/kg | Freq: Once | INTRAVENOUS | Status: AC
  Administered 2016-02-23: 475 mg via INTRAVENOUS
  Filled 2016-02-23: qty 3

## 2016-02-23 MED ORDER — PALONOSETRON HCL INJECTION 0.25 MG/5ML
0.2500 mg | Freq: Once | INTRAVENOUS | Status: AC
Start: 2016-02-23 — End: 2016-02-23
  Administered 2016-02-23: 0.25 mg via INTRAVENOUS

## 2016-02-23 MED ORDER — ATROPINE SULFATE 1 MG/ML IJ SOLN: 0.5000 mg | Freq: Once | INTRAMUSCULAR | Status: DC | PRN

## 2016-02-23 MED ORDER — PALONOSETRON HCL INJECTION 0.25 MG/5ML
INTRAVENOUS | Status: AC
  Filled 2016-02-23: qty 5

## 2016-02-23 MED ORDER — DEXAMETHASONE SODIUM PHOSPHATE 10 MG/ML IJ SOLN
INTRAMUSCULAR | Status: AC
  Filled 2016-02-23: qty 1

## 2016-02-23 MED ORDER — SODIUM CHLORIDE 0.9 % IV SOLN
2400.0000 mg/m2 | INTRAVENOUS | Status: DC
  Administered 2016-02-23: 4650 mg via INTRAVENOUS
  Filled 2016-02-23: qty 93

## 2016-02-23 MED ORDER — DEXAMETHASONE SODIUM PHOSPHATE 10 MG/ML IJ SOLN
10.0000 mg | Freq: Once | INTRAMUSCULAR | Status: AC
  Administered 2016-02-23: 10 mg via INTRAVENOUS

## 2016-02-23 MED ORDER — IRINOTECAN HCL CHEMO INJECTION 100 MG/5ML
180.0000 mg/m2 | Freq: Once | INTRAVENOUS | Status: AC
  Administered 2016-02-23: 340 mg via INTRAVENOUS
  Filled 2016-02-23: qty 7

## 2016-02-23 NOTE — Progress Notes (Signed)
Tolerated tx w/o adverse reaction.  Alert, in no distress.  VSS.  Discharged ambulatory. 

## 2016-02-25 ENCOUNTER — Encounter (HOSPITAL_BASED_OUTPATIENT_CLINIC_OR_DEPARTMENT_OTHER): Payer: Medicaid Other

## 2016-02-25 ENCOUNTER — Encounter (HOSPITAL_COMMUNITY): Payer: Self-pay

## 2016-02-25 VITALS — BP 151/81 | HR 85 | Temp 98.6°F | Resp 18

## 2016-02-25 DIAGNOSIS — C20 Malignant neoplasm of rectum: Secondary | ICD-10-CM | POA: Diagnosis not present

## 2016-02-25 DIAGNOSIS — C7800 Secondary malignant neoplasm of unspecified lung: Secondary | ICD-10-CM | POA: Diagnosis not present

## 2016-02-25 DIAGNOSIS — Z452 Encounter for adjustment and management of vascular access device: Secondary | ICD-10-CM

## 2016-02-25 MED ORDER — HEPARIN SOD (PORK) LOCK FLUSH 100 UNIT/ML IV SOLN
500.0000 [IU] | Freq: Once | INTRAVENOUS | Status: AC | PRN
Start: 2016-02-25 — End: 2016-02-25
  Administered 2016-02-25: 500 [IU]
  Filled 2016-02-25: qty 5

## 2016-02-25 MED ORDER — SODIUM CHLORIDE 0.9% FLUSH
10.0000 mL | INTRAVENOUS | Status: DC | PRN
  Administered 2016-02-25: 10 mL
  Filled 2016-02-25: qty 10

## 2016-02-25 NOTE — Progress Notes (Signed)
Andrew Kline returns today for port de access and flush after 46 hr continous infusion of 39f. Tolerated infusion without problems. Portacath located rightchest wall was deaccessed and flushed with 239mNS and 500U/83m51meparin and needle removed intact.  Procedure without incident. Patient tolerated procedure well.  Vitals stable and discharged home form clinic, ambulatory

## 2016-02-25 NOTE — Patient Instructions (Signed)
[place] at [facility] Discharge Instructions  RECOMMENDATIONS MADE BY THE CONSULTANT AND ANY TEST RESULTS WILL BE SENT TO YOUR REFERRING PHYSICIAN.  5FU continuous pump disconnected today. Follow up as scheduled  Thank you for choosing [place] at [facility] to provide your oncology and hematology care.  To afford each patient quality time with our provider, please arrive at least 15 minutes before your scheduled appointment time.   Beginning January 23rd 2017 lab work for the [facility] will be done in the  Main lab at [facility] on 1st floor. If you have a lab appointment with the [facility] please come in thru the  Main Entrance and check in at the main information desk  You need to re-schedule your appointment should you arrive 10 or more minutes late.  We strive to give you quality time with our providers, and arriving late affects you and other patients whose appointments are after yours.  Also, if you no show three or more times for appointments you may be dismissed from the clinic at the providers discretion.     Again, thank you for choosing [facility].  Our hope is that these requests will decrease the amount of time that you wait before being seen by our physicians.       _____________________________________________________________  Should you have questions after your visit to [facility], please contact our office at [phone] between the hours of 8:30 a.m. and 4:30 p.m.  Voicemails left after 4:30 p.m. will not be returned until the following business day.  For prescription refill requests, have your pharmacy contact our office.         Resources For Cancer Patients and their Caregivers ? American Cancer Society: Can assist with transportation, wigs, general needs, runs Look Good Feel Better.        (787)320-1314 ? Cancer Care: Provides financial assistance, online support groups,  medication/co-pay assistance.  1-800-813-HOPE 856-303-9609) ? [place] Assists [place] cancer patients and their families through emotional , educational and financial support.  470 402 6809 ? [place] DSS Where to apply for food stamps, Medicaid and utility assistance. 712-410-3452 ? RCATS: Transportation to medical appointments. 478-366-6665 ? Social Security Administration: May apply for disability if have a Stage IV cancer. 534-446-0061 (770)051-8572 ? [employer], Disability and Transit Services: Assists with nutrition, care and transit needs. [place] Support Programs: '@10RELATIVEDAYS'$ @ > Cancer Support Group  2nd Tuesday of the month 1pm-2pm, Journey Room  > Creative Journey  3rd Tuesday of the month 1130am-1pm, Journey Room  > Look Good Feel Better  1st Wednesday of the month 10am-12 noon, Journey Room (Call [place] to register [phone])

## 2016-02-26 ENCOUNTER — Other Ambulatory Visit (HOSPITAL_COMMUNITY): Payer: Self-pay | Admitting: Pharmacist

## 2016-03-03 ENCOUNTER — Encounter (HOSPITAL_COMMUNITY): Payer: Self-pay | Admitting: Hematology & Oncology

## 2016-03-04 ENCOUNTER — Other Ambulatory Visit: Payer: Self-pay | Admitting: Urology

## 2016-03-04 ENCOUNTER — Encounter (HOSPITAL_COMMUNITY): Payer: Self-pay | Admitting: Emergency Medicine

## 2016-03-04 ENCOUNTER — Inpatient Hospital Stay (HOSPITAL_COMMUNITY)
Admission: EM | Admit: 2016-03-04 | Discharge: 2016-03-16 | DRG: 871 | Disposition: A | Discharge status: Hospice | Payer: Medicaid Other | Attending: Family Medicine | Admitting: Family Medicine

## 2016-03-04 ENCOUNTER — Emergency Department (HOSPITAL_COMMUNITY): Payer: Medicaid Other

## 2016-03-04 DIAGNOSIS — K651 Peritoneal abscess: Secondary | ICD-10-CM | POA: Diagnosis not present

## 2016-03-04 DIAGNOSIS — L503 Dermatographic urticaria: Secondary | ICD-10-CM | POA: Diagnosis present

## 2016-03-04 DIAGNOSIS — N360 Urethral fistula: Secondary | ICD-10-CM | POA: Diagnosis present

## 2016-03-04 DIAGNOSIS — C649 Malignant neoplasm of unspecified kidney, except renal pelvis: Secondary | ICD-10-CM

## 2016-03-04 DIAGNOSIS — E861 Hypovolemia: Secondary | ICD-10-CM | POA: Diagnosis present

## 2016-03-04 DIAGNOSIS — D638 Anemia in other chronic diseases classified elsewhere: Secondary | ICD-10-CM | POA: Diagnosis present

## 2016-03-04 DIAGNOSIS — F329 Major depressive disorder, single episode, unspecified: Secondary | ICD-10-CM | POA: Diagnosis present

## 2016-03-04 DIAGNOSIS — C641 Malignant neoplasm of right kidney, except renal pelvis: Secondary | ICD-10-CM | POA: Diagnosis present

## 2016-03-04 DIAGNOSIS — C20 Malignant neoplasm of rectum: Secondary | ICD-10-CM | POA: Diagnosis not present

## 2016-03-04 DIAGNOSIS — F419 Anxiety disorder, unspecified: Secondary | ICD-10-CM | POA: Diagnosis present

## 2016-03-04 DIAGNOSIS — Z515 Encounter for palliative care: Secondary | ICD-10-CM | POA: Diagnosis present

## 2016-03-04 DIAGNOSIS — N321 Vesicointestinal fistula: Secondary | ICD-10-CM | POA: Diagnosis present

## 2016-03-04 DIAGNOSIS — Z7189 Other specified counseling: Secondary | ICD-10-CM | POA: Diagnosis not present

## 2016-03-04 DIAGNOSIS — B3749 Other urogenital candidiasis: Secondary | ICD-10-CM | POA: Diagnosis present

## 2016-03-04 DIAGNOSIS — Z9359 Other cystostomy status: Secondary | ICD-10-CM

## 2016-03-04 DIAGNOSIS — Z79899 Other long term (current) drug therapy: Secondary | ICD-10-CM

## 2016-03-04 DIAGNOSIS — L0889 Other specified local infections of the skin and subcutaneous tissue: Secondary | ICD-10-CM | POA: Diagnosis not present

## 2016-03-04 DIAGNOSIS — L03317 Cellulitis of buttock: Secondary | ICD-10-CM | POA: Diagnosis present

## 2016-03-04 DIAGNOSIS — R188 Other ascites: Secondary | ICD-10-CM

## 2016-03-04 DIAGNOSIS — M60009 Infective myositis, unspecified site: Secondary | ICD-10-CM | POA: Diagnosis not present

## 2016-03-04 DIAGNOSIS — C7801 Secondary malignant neoplasm of right lung: Secondary | ICD-10-CM | POA: Diagnosis present

## 2016-03-04 DIAGNOSIS — R509 Fever, unspecified: Secondary | ICD-10-CM

## 2016-03-04 DIAGNOSIS — B9689 Other specified bacterial agents as the cause of diseases classified elsewhere: Secondary | ICD-10-CM | POA: Diagnosis not present

## 2016-03-04 DIAGNOSIS — R609 Edema, unspecified: Secondary | ICD-10-CM

## 2016-03-04 DIAGNOSIS — Z933 Colostomy status: Secondary | ICD-10-CM

## 2016-03-04 DIAGNOSIS — R52 Pain, unspecified: Secondary | ICD-10-CM

## 2016-03-04 DIAGNOSIS — E871 Hypo-osmolality and hyponatremia: Secondary | ICD-10-CM | POA: Diagnosis present

## 2016-03-04 DIAGNOSIS — F17210 Nicotine dependence, cigarettes, uncomplicated: Secondary | ICD-10-CM | POA: Diagnosis not present

## 2016-03-04 DIAGNOSIS — C7800 Secondary malignant neoplasm of unspecified lung: Secondary | ICD-10-CM | POA: Diagnosis not present

## 2016-03-04 DIAGNOSIS — Z905 Acquired absence of kidney: Secondary | ICD-10-CM | POA: Diagnosis not present

## 2016-03-04 DIAGNOSIS — M60052 Infective myositis, left thigh: Secondary | ICD-10-CM | POA: Diagnosis not present

## 2016-03-04 DIAGNOSIS — C787 Secondary malignant neoplasm of liver and intrahepatic bile duct: Secondary | ICD-10-CM | POA: Diagnosis present

## 2016-03-04 DIAGNOSIS — A419 Sepsis, unspecified organism: Principal | ICD-10-CM | POA: Diagnosis present

## 2016-03-04 DIAGNOSIS — N390 Urinary tract infection, site not specified: Secondary | ICD-10-CM | POA: Diagnosis present

## 2016-03-04 DIAGNOSIS — Z936 Other artificial openings of urinary tract status: Secondary | ICD-10-CM | POA: Diagnosis not present

## 2016-03-04 DIAGNOSIS — Z66 Do not resuscitate: Secondary | ICD-10-CM | POA: Diagnosis present

## 2016-03-04 DIAGNOSIS — K435 Parastomal hernia without obstruction or  gangrene: Secondary | ICD-10-CM | POA: Diagnosis present

## 2016-03-04 DIAGNOSIS — L03116 Cellulitis of left lower limb: Secondary | ICD-10-CM | POA: Diagnosis present

## 2016-03-04 DIAGNOSIS — L0291 Cutaneous abscess, unspecified: Secondary | ICD-10-CM | POA: Diagnosis not present

## 2016-03-04 DIAGNOSIS — G8929 Other chronic pain: Secondary | ICD-10-CM | POA: Diagnosis present

## 2016-03-04 DIAGNOSIS — L0231 Cutaneous abscess of buttock: Secondary | ICD-10-CM | POA: Diagnosis not present

## 2016-03-04 LAB — CBC WITH DIFFERENTIAL/PLATELET
BASOS ABS: 0 10*3/uL (ref 0.0–0.1)
Basophils Relative: 0 %
EOS PCT: 0 %
Eosinophils Absolute: 0 10*3/uL (ref 0.0–0.7)
HEMATOCRIT: 39.8 % (ref 39.0–52.0)
Hemoglobin: 13.5 g/dL (ref 13.0–17.0)
LYMPHS ABS: 0.6 10*3/uL — AB (ref 0.7–4.0)
LYMPHS PCT: 4 %
MCH: 28.4 pg (ref 26.0–34.0)
MCHC: 33.9 g/dL (ref 30.0–36.0)
MCV: 83.8 fL (ref 78.0–100.0)
Monocytes Absolute: 2.1 10*3/uL — ABNORMAL HIGH (ref 0.1–1.0)
Monocytes Relative: 11 %
NEUTROS ABS: 15.3 10*3/uL — AB (ref 1.7–7.7)
Neutrophils Relative %: 85 %
PLATELETS: 288 10*3/uL (ref 150–400)
RBC: 4.75 MIL/uL (ref 4.22–5.81)
RDW: 18.5 % — ABNORMAL HIGH (ref 11.5–15.5)
WBC: 18 10*3/uL — AB (ref 4.0–10.5)

## 2016-03-04 LAB — URINE MICROSCOPIC-ADD ON

## 2016-03-04 LAB — URINALYSIS, ROUTINE W REFLEX MICROSCOPIC
Bilirubin Urine: NEGATIVE
GLUCOSE, UA: NEGATIVE mg/dL
KETONES UR: NEGATIVE mg/dL
Nitrite: NEGATIVE
PH: 6 (ref 5.0–8.0)
PROTEIN: 100 mg/dL — AB
Specific Gravity, Urine: 1.015 (ref 1.005–1.030)

## 2016-03-04 LAB — COMPREHENSIVE METABOLIC PANEL
ALBUMIN: 3.1 g/dL — AB (ref 3.5–5.0)
ALK PHOS: 154 U/L — AB (ref 38–126)
ALT: 33 U/L (ref 17–63)
ANION GAP: 12 (ref 5–15)
AST: 40 U/L (ref 15–41)
BUN: 8 mg/dL (ref 6–20)
CALCIUM: 8.1 mg/dL — AB (ref 8.9–10.3)
CHLORIDE: 91 mmol/L — AB (ref 101–111)
CO2: 21 mmol/L — AB (ref 22–32)
CREATININE: 0.98 mg/dL (ref 0.61–1.24)
GFR calc Af Amer: >60 mL/min (ref >60)
GFR calc non Af Amer: >60 mL/min (ref >60)
GLUCOSE: 166 mg/dL — AB (ref 65–99)
Potassium: 3.4 mmol/L — ABNORMAL LOW (ref 3.5–5.1)
SODIUM: 124 mmol/L — AB (ref 135–145)
Total Bilirubin: 1.3 mg/dL — ABNORMAL HIGH (ref 0.3–1.2)
Total Protein: 7.2 g/dL (ref 6.5–8.1)

## 2016-03-04 LAB — I-STAT CHEM 8, ED
BUN: 7 mg/dL (ref 6–20)
CALCIUM ION: 1 mmol/L — AB (ref 1.15–1.40)
CREATININE: 0.9 mg/dL (ref 0.61–1.24)
Chloride: 90 mmol/L — ABNORMAL LOW (ref 101–111)
GLUCOSE: 168 mg/dL — AB (ref 65–99)
HCT: 43 % (ref 39.0–52.0)
HEMOGLOBIN: 14.6 g/dL (ref 13.0–17.0)
POTASSIUM: 3.6 mmol/L (ref 3.5–5.1)
Sodium: 128 mmol/L — ABNORMAL LOW (ref 135–145)
TCO2: 23 mmol/L (ref 0–100)

## 2016-03-04 LAB — I-STAT CG4 LACTIC ACID, ED
Lactic Acid, Venous: 3.4 mmol/L (ref 0.5–1.9)
Lactic Acid, Venous: 1.11 mmol/L (ref 0.5–1.9)

## 2016-03-04 LAB — APTT: aPTT: 41 seconds — ABNORMAL HIGH (ref 24–36)

## 2016-03-04 LAB — PROTIME-INR
INR: 1.33
Prothrombin Time: 16.6 seconds — ABNORMAL HIGH (ref 11.4–15.2)

## 2016-03-04 MED ORDER — ONDANSETRON HCL 4 MG PO TABS: 4.0000 mg | ORAL_TABLET | Freq: Four times a day (QID) | ORAL | Status: DC | PRN

## 2016-03-04 MED ORDER — SODIUM CHLORIDE 0.9 % IV BOLUS (SEPSIS)
1000.0000 mL | Freq: Once | INTRAVENOUS | Status: AC
  Administered 2016-03-04: 1000 mL via INTRAVENOUS

## 2016-03-04 MED ORDER — SODIUM CHLORIDE 0.9 % IV BOLUS (SEPSIS)
2500.0000 mL | Freq: Once | INTRAVENOUS | Status: AC
  Administered 2016-03-04: 2500 mL via INTRAVENOUS

## 2016-03-04 MED ORDER — HYDROMORPHONE HCL 2 MG PO TABS
4.0000 mg | ORAL_TABLET | ORAL | Status: DC | PRN
  Administered 2016-03-04 – 2016-03-08 (×15): 4 mg via ORAL
  Filled 2016-03-04 (×16): qty 2

## 2016-03-04 MED ORDER — ONDANSETRON HCL 4 MG/2ML IJ SOLN: 4.0000 mg | Freq: Four times a day (QID) | INTRAMUSCULAR | Status: DC | PRN

## 2016-03-04 MED ORDER — ALBUTEROL SULFATE (2.5 MG/3ML) 0.083% IN NEBU: 3.0000 mL | INHALATION_SOLUTION | Freq: Four times a day (QID) | RESPIRATORY_TRACT | Status: DC | PRN

## 2016-03-04 MED ORDER — HYDROMORPHONE HCL 1 MG/ML IJ SOLN
1.0000 mg | Freq: Once | INTRAMUSCULAR | Status: AC
  Administered 2016-03-04: 1 mg via INTRAVENOUS
  Filled 2016-03-04: qty 1

## 2016-03-04 MED ORDER — PIPERACILLIN-TAZOBACTAM 3.375 G IVPB 30 MIN
3.3750 g | Freq: Once | INTRAVENOUS | Status: AC
  Administered 2016-03-04: 3.375 g via INTRAVENOUS
  Filled 2016-03-04: qty 50

## 2016-03-04 MED ORDER — KETOROLAC TROMETHAMINE 30 MG/ML IJ SOLN
30.0000 mg | Freq: Once | INTRAMUSCULAR | Status: AC
  Administered 2016-03-04: 30 mg via INTRAVENOUS
  Filled 2016-03-04: qty 1

## 2016-03-04 MED ORDER — IOPAMIDOL (ISOVUE-300) INJECTION 61%
100.0000 mL | Freq: Once | INTRAVENOUS | Status: AC | PRN
  Administered 2016-03-04: 100 mL via INTRAVENOUS

## 2016-03-04 MED ORDER — FENTANYL 100 MCG/HR TD PT72
150.0000 ug | MEDICATED_PATCH | TRANSDERMAL | Status: DC
  Administered 2016-03-06 – 2016-03-09 (×2): 150 ug via TRANSDERMAL
  Filled 2016-03-04 (×2): qty 2

## 2016-03-04 MED ORDER — ENOXAPARIN SODIUM 40 MG/0.4ML ~~LOC~~ SOLN
40.0000 mg | SUBCUTANEOUS | Status: DC
  Administered 2016-03-04 – 2016-03-11 (×8): 40 mg via SUBCUTANEOUS
  Filled 2016-03-04 (×9): qty 0.4

## 2016-03-04 MED ORDER — ACETAMINOPHEN 650 MG RE SUPP
650.0000 mg | Freq: Four times a day (QID) | RECTAL | Status: DC | PRN
Start: 2016-03-04 — End: 2016-03-16

## 2016-03-04 MED ORDER — SODIUM CHLORIDE 0.9% FLUSH
3.0000 mL | Freq: Two times a day (BID) | INTRAVENOUS | Status: DC
  Administered 2016-03-04 – 2016-03-16 (×16): 3 mL via INTRAVENOUS

## 2016-03-04 MED ORDER — VANCOMYCIN HCL IN DEXTROSE 1-5 GM/200ML-% IV SOLN
1000.0000 mg | Freq: Once | INTRAVENOUS | Status: AC
Start: 2016-03-04 — End: 2016-03-04
  Administered 2016-03-04: 1000 mg via INTRAVENOUS
  Filled 2016-03-04: qty 200

## 2016-03-04 MED ORDER — DULOXETINE HCL 60 MG PO CPEP
60.0000 mg | ORAL_CAPSULE | Freq: Every day | ORAL | Status: DC
  Administered 2016-03-05 – 2016-03-16 (×12): 60 mg via ORAL
  Filled 2016-03-04 (×12): qty 1

## 2016-03-04 MED ORDER — ACETAMINOPHEN 325 MG PO TABS
650.0000 mg | ORAL_TABLET | Freq: Once | ORAL | Status: AC
  Administered 2016-03-04: 650 mg via ORAL
  Filled 2016-03-04: qty 2

## 2016-03-04 MED ORDER — CLINDAMYCIN PHOSPHATE 600 MG/50ML IV SOLN: 600.0000 mg | Freq: Three times a day (TID) | INTRAVENOUS | Status: DC

## 2016-03-04 MED ORDER — ACETAMINOPHEN 325 MG PO TABS
650.0000 mg | ORAL_TABLET | Freq: Four times a day (QID) | ORAL | Status: DC | PRN
  Administered 2016-03-07: 650 mg via ORAL
  Filled 2016-03-04: qty 2

## 2016-03-04 MED ORDER — ZOLPIDEM TARTRATE 5 MG PO TABS
5.0000 mg | ORAL_TABLET | Freq: Every evening | ORAL | Status: DC | PRN
  Administered 2016-03-04 – 2016-03-11 (×8): 5 mg via ORAL
  Filled 2016-03-04 (×8): qty 1

## 2016-03-04 MED ORDER — PREGABALIN 25 MG PO CAPS
150.0000 mg | ORAL_CAPSULE | Freq: Two times a day (BID) | ORAL | Status: DC
  Administered 2016-03-04 – 2016-03-16 (×24): 150 mg via ORAL
  Filled 2016-03-04 (×24): qty 6

## 2016-03-04 MED ORDER — IOPAMIDOL (ISOVUE-300) INJECTION 61%
INTRAVENOUS | Status: AC
  Filled 2016-03-04: qty 30

## 2016-03-04 MED ORDER — DOCUSATE SODIUM 100 MG PO CAPS
100.0000 mg | ORAL_CAPSULE | Freq: Every day | ORAL | Status: DC | PRN
Start: 2016-03-04 — End: 2016-03-06

## 2016-03-04 MED ORDER — ONDANSETRON HCL 4 MG/2ML IJ SOLN
4.0000 mg | Freq: Once | INTRAMUSCULAR | Status: AC
  Administered 2016-03-04: 4 mg via INTRAVENOUS
  Filled 2016-03-04: qty 2

## 2016-03-04 MED ORDER — AMITRIPTYLINE HCL 50 MG PO TABS
50.0000 mg | ORAL_TABLET | Freq: Every day | ORAL | Status: DC
  Administered 2016-03-04 – 2016-03-15 (×12): 50 mg via ORAL
  Filled 2016-03-04 (×12): qty 1

## 2016-03-04 MED ORDER — LACTATED RINGERS IV SOLN
INTRAVENOUS | Status: DC
  Administered 2016-03-04 – 2016-03-07 (×5): via INTRAVENOUS

## 2016-03-04 NOTE — H&P (Signed)
History and Physical    Andrew Kline IHK:[id number] DOB: 01-30-1966 DOA: 03/04/2016  PCP: [name], MD Consultants:  [place] - urology; Arnoldo Morale - surgery Patient coming from: home - lives with wife; NOK: wife, [phone]  Chief Complaint: fever/chills  HPI: Andrew Kline is a 50 y.o. male with medical history significant of metastatic rectal CA with colostomy, renal cell carcinoma of the right kidney s/p partial nephrectomy and now with B nephrostomy tubes and prior presacral abscess requiring admission from 9/25-10/1 presenting with recurrent infection.  Patient reports that for the last 3-4 days he has slept nonstop.  This is not like him.  Moving around is difficult, hard to change positions.  Left buttocks (with prior drain in the past) appears to have the same problem again by scan, but no symptoms.  +fever to 102.4 in ER.  Sweaty.  Low back pain and into buttocks and radiation into abdomen.    On prior admission, patient was noted to have complicated GU/rectal anatomy due to prior cancer with resection.  He required IR intervention at [facility] with placement of bilateral PCNs with end coiled and locked within the renal pelvis and PCNs connected to gravity bag.    Tonight, I was called by Dr. Arnoldo Morale about this patient. He reports that the presacral abscess appears to have gas in it and that it will require percutaneous drainage via IR.  He will also need antibiotic coverage with Clindamycin for gas-forming organisms.  However, he does not currently require surgical intervention otherwise at this time - he may eventually need I&D of the buttocks abscess and he will be happy to perform this procedure at [facility]  However, in the absence of IR assistance available at [facility], the patient will require transfer to [facility] at this time.   ED Course:  Per Dr. Roderic Palau: CT scan shows most likely abscess in the presacral area patient also the cellulitis left buttocks and is septic. General surgery was  consult and felt like the patient should be transferred to [facility] for possible percutaneous drainage of infection. Medicine is admitting the patient to stepdown [facility]  Review of Systems: As per HPI; otherwise 10 point review of systems reviewed and negative.   Ambulatory Status:  Ambulates without assistance normally  Past Medical History:  Diagnosis Date  . Anxiety   . Arthritis   . Depression   . Kidney stone 03/22/15   left  . Rectal cancer ([place])   . Rectal pain   . Renal cell cancer ([place])    2012.   [name] Shortness of breath dyspnea     Past Surgical History:  Procedure Laterality Date  . BIOPSY N/A 09/30/2014   Procedure: BIOPSY;  Surgeon: Danie Binder, MD;  Location: [facility] ORS;  Service: Endoscopy;  Laterality: N/A;  Anal Canal  . COLOSTOMY N/A 02/11/2015   Procedure: COLOSTOMY;  Surgeon: Aviva Signs, MD;  Location: [facility] ORS;  Service: General;  Laterality: N/A;  . FLEXIBLE SIGMOIDOSCOPY N/A 09/30/2014   Procedure: FLEXIBLE SIGMOIDOSCOPY;  Surgeon: Danie Binder, MD;  Location: [facility] ORS;  Service: Endoscopy;  Laterality: N/A;  . FLEXIBLE SIGMOIDOSCOPY N/A 10/17/2014   Procedure: FLEXIBLE SIGMOIDOSCOPY;  Surgeon: Danie Binder, MD;  Location: [facility] ENDO SUITE;  Service: Endoscopy;  Laterality: N/A;  1325  . HYPOSPADIAS CORRECTION    . IR GENERIC HISTORICAL  12/17/2015   IR CATHETER TUBE CHANGE 12/17/2015 [facility]-INTERV RAD  . IR GENERIC HISTORICAL  01/27/2016   IR NEPHROSTOMY PLACEMENT RIGHT 01/27/2016 [facility]-INTERV RAD  .  IR GENERIC HISTORICAL  01/27/2016   IR NEPHROSTOMY PLACEMENT LEFT 01/27/2016 [facility]-INTERV RAD  . KNEE SURGERY Left    arthroscopy  . LAPAROSCOPIC PARTIAL NEPHRECTOMY  2012   right side  . LUNG BIOPSY Right   . PERINEAL PROCTECTOMY N/A 08/26/2015   Procedure:  PROCTECTOMY;  Surgeon: Aviva Signs, MD;  Location: [facility] ORS;  Service: General;  Laterality: N/A;  . RECTAL SURGERY      Social History   Social History  . Marital status: Divorced    Spouse name: N/A  . Number of  children: 2  . Years of education: N/A   Occupational History  . disabled    Social History Main Topics  . Smoking status: Current Every Day Smoker    Packs/day: 0.50    Years: 30.00    Types: Cigarettes  . Smokeless tobacco: Never Used  . Alcohol use 0.0 oz/week     Comment: Occ  . Drug use: No  . Sexual activity: Yes    Birth control/ protection: None   Other Topics Concern  . Not on file   Social History Narrative  . No narrative on file    Allergies  Allergen Reactions  . Oxaliplatin Itching    Family History  Problem Relation Age of Onset  . Healthy Mother   . Lung cancer Maternal Grandfather   . Colon cancer Maternal Uncle     age 30  . Diabetes Other     Prior to Admission medications   Medication Sig Start Date End Date Taking? Authorizing Provider  albuterol (PROVENTIL HFA;VENTOLIN HFA) 108 (90 Base) MCG/ACT inhaler Inhale 2 puffs into the lungs every 6 (six) hours as needed for wheezing or shortness of breath. 05/26/15  Yes Patrici Ranks, MD  amitriptyline (ELAVIL) 50 MG tablet Take 1 tablet (50 mg total) by mouth at bedtime. 12/31/15  Yes Patrici Ranks, MD  docusate sodium (COLACE) 100 MG capsule Take 1 capsule (100 mg total) by mouth every 12 (twelve) hours. Patient taking differently: Take 100 mg by mouth daily as needed for mild constipation or moderate constipation.  09/25/14  Yes Christopher Lawyer, PA-C  DULoxetine (CYMBALTA) 60 MG capsule Take 1 capsule (60 mg total) by mouth daily. 12/31/15  Yes Patrici Ranks, MD  fentaNYL (DURAGESIC - DOSED MCG/HR) 75 MCG/HR Place 2 patches (150 mcg total) onto the skin every 3 (three) days. 02/01/16  Yes Manon Hilding Kefalas, PA-C  HYDROmorphone (DILAUDID) 4 MG tablet Take 1 tablet (4 mg total) by mouth every 4 (four) hours as needed for severe pain. 02/22/16  Yes Manon Hilding Kefalas, PA-C  pregabalin (LYRICA) 75 MG capsule Take 2 capsules (150 mg total) by mouth 2 (two) times daily. 12/31/15  Yes Patrici Ranks,  MD  zolpidem (AMBIEN CR) 12.5 MG CR tablet Take 1 tablet (12.5 mg total) by mouth at bedtime as needed for sleep. 12/31/15  Yes Patrici Ranks, MD    Physical Exam: Vitals:   03/04/16 2000 03/04/16 2030 03/04/16 2042 03/04/16 2228  BP: 93/64 93/78  128/78  Pulse: 83 82  (!) 102  Resp: '14 13  20  '$ Temp:   98 F (36.7 C) 98.1 F (36.7 C)  TempSrc:   Oral Oral  SpO2: 96% 96%  100%  Weight:    98.9 kg (218 lb)  Height:    '5\' 5"'$  (1.651 m)     General: Appears disheveled and diaphoretic, room is quite malodorous Eyes:  PERRL, EOMI, normal lids, iris  ENT:  grossly normal hearing, lips & tongue, mmm Neck:  no LAD, masses or thyromegaly Cardiovascular:  RRR, no m/r/g. No LE edema.  Respiratory:  CTA bilaterally, no w/r/r. Normal respiratory effort. Abdomen:  soft, ntnd, NABS, +colostomy Skin:  no rash or induration seen on limited exam; mild swelling and tenderness to buttocks Musculoskeletal:  grossly normal tone BUE/BLE, good ROM, no bony abnormality, +bilateral perc tubes with leg bags draining urine Psychiatric:  Somewhat eccentric affect, speech fluent and appropriate, AOx3 Neurologic:  CN 2-12 grossly intact, moves all extremities in coordinated fashion, sensation intact  Labs on Admission: I have personally reviewed following labs and imaging studies  CBC:  Recent Labs Lab 03/04/16 1610 03/04/16 1616  WBC  --  18.0*  NEUTROABS  --  15.3*  HGB 14.6 13.5  HCT 43.0 39.8  MCV  --  83.8  PLT  --  124   Basic Metabolic Panel:  Recent Labs Lab 03/04/16 1610 03/04/16 1616  NA 128* 124*  K 3.6 3.4*  CL 90* 91*  CO2  --  21*  GLUCOSE 168* 166*  BUN 7 8  CREATININE 0.90 0.98  CALCIUM  --  8.1*   GFR: Estimated Creatinine Clearance: 95.4 mL/min (by C-G formula based on SCr of 0.98 mg/dL). Liver Function Tests:  Recent Labs Lab 03/04/16 1616  AST 40  ALT 33  ALKPHOS 154*  BILITOT 1.3*  PROT 7.2  ALBUMIN 3.1*   No results for input(s): LIPASE, AMYLASE in  the last 168 hours. No results for input(s): AMMONIA in the last 168 hours. Coagulation Profile: No results for input(s): INR, PROTIME in the last 168 hours. Cardiac Enzymes: No results for input(s): CKTOTAL, CKMB, CKMBINDEX, TROPONINI in the last 168 hours. BNP (last 3 results) No results for input(s): PROBNP in the last 8760 hours. HbA1C: No results for input(s): HGBA1C in the last 72 hours. CBG: No results for input(s): GLUCAP in the last 168 hours. Lipid Profile: No results for input(s): CHOL, HDL, LDLCALC, TRIG, CHOLHDL, LDLDIRECT in the last 72 hours. Thyroid Function Tests: No results for input(s): TSH, T4TOTAL, FREET4, T3FREE, THYROIDAB in the last 72 hours. Anemia Panel: No results for input(s): VITAMINB12, FOLATE, FERRITIN, TIBC, IRON, RETICCTPCT in the last 72 hours. Urine analysis:    Component Value Date/Time   COLORURINE YELLOW 03/04/2016 1637   APPEARANCEUR CLEAR 03/04/2016 1637   LABSPEC 1.015 03/04/2016 1637   PHURINE 6.0 03/04/2016 1637   GLUCOSEU NEGATIVE 03/04/2016 1637   HGBUR LARGE (A) 03/04/2016 1637   BILIRUBINUR NEGATIVE 03/04/2016 1637   KETONESUR NEGATIVE 03/04/2016 1637   PROTEINUR 100 (A) 03/04/2016 1637   UROBILINOGEN 0.2 03/17/2015 0910   NITRITE NEGATIVE 03/04/2016 1637   LEUKOCYTESUR LARGE (A) 03/04/2016 1637    Creatinine Clearance: Estimated Creatinine Clearance: 95.4 mL/min (by C-G formula based on SCr of 0.98 mg/dL).  Sepsis Labs: '@LABRCNTIP'$ (procalcitonin:4,lacticidven:4) ) Recent Results (from the past 240 hour(s))  Culture, blood (Routine x 2)     Status: None (Preliminary result)   Collection Time: 03/04/16  4:16 PM  Result Value Ref Range Status   Specimen Description BLOOD RIGHT HAND  Final   Special Requests BOTTLES DRAWN AEROBIC AND ANAEROBIC 8CC EACH  Final   Culture PENDING  Incomplete   Report Status PENDING  Incomplete  Culture, blood (Routine x 2)     Status: None (Preliminary result)   Collection Time: 03/04/16  4:17  PM  Result Value Ref Range Status   Specimen Description LEFT ANTECUBITAL  Final  Special Requests BOTTLES DRAWN AEROBIC AND ANAEROBIC [facility] EACH  Final   Culture PENDING  Incomplete   Report Status PENDING  Incomplete     Radiological Exams on Admission: Ct Abdomen Pelvis W Contrast  Result Date: 03/04/2016 CLINICAL DATA:  Fever, low back pain, LEFT foot pain, shortness of breath, tachycardia, generalized abdominal pain, history renal cell carcinoma, rectal cancer, kidney stones, former smoker EXAM: CT ABDOMEN AND PELVIS WITH CONTRAST TECHNIQUE: Multidetector CT imaging of the abdomen and pelvis was performed using the standard protocol following bolus administration of intravenous contrast. Sagittal and coronal MPR images reconstructed from axial data set. CONTRAST:  113m ISOVUE-300 IOPAMIDOL (ISOVUE-300) INJECTION 61% COMPARISON:  01/26/2016, 01/12/2016 FINDINGS: Lower chest: Bibasilar pulmonary metastases again identified. 2.1 x 1.7 cm diameter RIGHT lower lobe metastasis image 7 has cavitated centrally since previous exam. LEFT lung base metastasis 17 x 13 mm image 15 appeared larger on the previous exam at approximately 17 x 18 mm. Other nodules appear grossly stable Hepatobiliary: Mild diffuse fatty infiltration of liver. Hypervascular focus lateral segment LEFT lobe liver 11 mm diameter image 19 Pancreas: Chronic calcific pancreatitis changes. No definite pancreatic mass or fluid collection. Spleen: Normal appearance Adrenals/Urinary Tract: Adrenal glands normal appearance. BILATERAL nephrostomy tubes without renal mass or hydronephrosis. Normal caliber ureters and bladder. Suprapubic catheter in bladder. Mild diffuse bladder wall thickening which could reflect chronic infection, chronic outlet obstruction, or prior radiation therapy. Stomach/Bowel: Normal appendix. LEFT lower quadrant colostomy with parastomal herniation of nonobstructed small bowel loops. Scattered stool throughout colon. Post  perineal proctectomy per chart. Large gas and fluid collection and presacral space, with thickened wall overall measuring 6.2 x 5.9 x 9.2 cm. Collection appears larger and the presence of air within the collection is new since the previous exam. Vascular/Lymphatic: Atherosclerotic calcifications aorta without aneurysm. Reproductive: N/A Other: No free air or free fluid. Musculoskeletal: Infiltrative changes and enlargement of the LEFT gluteus maximus, containing probable fluid and multiple foci of gas consistent with infection, potentially by gas-forming organism versus extension of the presacral space process. No acute osseous findings. IMPRESSION: Increase in size of a presacral collection now 6.2 x 5.9 x 9.2 cm, newly containing gas, question abscess. New enlargement and gas within the LEFT gluteus maximus likely representing infection, question by a gas-forming organism. Interval resolution of BILATERAL hydronephrosis post nephrostomy placement. 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 Pulmonary metastases, with a LEFT lower lobe lesion appearing slightly smaller and a RIGHT lower lead lesion now cavitated. Bladder wall thickening question due to chronic infection, chronic outlet obstruction, or prior pelvic irradiation. Chronic calcific pancreatitis. 11 mm hypervascular focus lateral segment LEFT lobe liver cannot exclude hypervascular metastasis. Findings called to Dr. ZRoderic Palauon 03/04/2016 at 1800 hours. Electronically Signed   By: MLavonia DanaM.D.   On: 03/04/2016 18:01   Dg Chest Port 1 View  Result Date: 03/04/2016 CLINICAL DATA:  Several days of fever, tachycardia, back pain, and shortness of breath. Patient recently hospitalized for severe kidney infection. EXAM: PORTABLE CHEST 1 VIEW COMPARISON:  Portable chest x-ray of January 25, 2016 FINDINGS: The lungs are adequately inflated. The interstitial markings are prominent  especially on the right. The heart is mildly enlarged. The central pulmonary vascularity is prominent. The mediastinum is normal in width. There is no pleural effusion. The power port catheter tip projects over the midportion of the SVC. The bony thorax is unremarkable. IMPRESSION: Mild interstitial prominence greatest on the left  not as conspicuous as on the previous study. This likely reflects low-grade pulmonary edema. There is no alveolar pneumonia nor pleural effusion. When the patient can tolerate the procedure, a PA and lateral chest x-ray would be useful. Electronically Signed   By: David  Martinique M.D.   On: 03/04/2016 16:40    EKG: Not done  Assessment/Plan Principal Problem:   Sepsis [facility]) Active Problems:   Rectal cancer metastasized to lung [facility])   Pelvic fluid collection   Renal cell carcinoma ([facility])   Hyponatremia   Sepsis, likely from presacral abscess and buttocks infection, possible gas-forming organism --Elevated WBC count, fever, tachycardia with elevated lactate to 3.4 and borderline hypotension -Sepsis protocol initiated -Based on abnormal CT, patient was discussed with Dr. Arnoldo Morale who recommends transfer to [facility] for percutaneous drainage of presacral abscess by IR -Blood and urine cultures pending -Will admit to SDU with telemetry and continue to monitor -Treat with IV Clindamycin in order to cover gas-forming organisms -If not improving and/or worsening, consider broadening coverage to include MRSA coverage given prior hospitalization, immunocompromise -Will trend lactate to ensure improvement -If patient needs eventual I&D of gluteal abscess, can transfer back to [facility] and Dr. 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 -h/o partial nephrectomy -s/p colostomy -pulmonary metastases -Imaging today indicates possible liver hypervascular  metastases -Palliative care may be beneficial at some point in the near future  Hyponatremia -Likely hypovolemic resulting from increased requirements in the setting of sepsis -Not at markedly increased risk for central pontine myelinolysis based on Na >120 -Replete volume deficiency and follow   DVT prophylaxis: Lovenox  Code Status: Full - confirmed with patient Family Communication: None present Disposition Plan:  Home once clinically improved Consults called: IR Admission status: Admit to SDU - It is my clinical opinion that admission to INPATIENT is reasonable and necessary because this patient will require at least 2 midnights in the hospital to treat this condition based on the medical complexity of the problems presented.  Given the aforementioned information, the predictability of an adverse outcome is felt to be significant.    Karmen Bongo MD [facility]  If 7PM-7AM, please contact night-coverage [email or web address] Password [email or web address]  03/04/2016, 11:07 PM

## 2016-03-04 NOTE — ED Notes (Addendum)
Lactic acid result 3.4

## 2016-03-04 NOTE — ED Notes (Signed)
Per Dr. Roderic Palau, pt does not have to drink all of his contrast and can be taken to CT.  CT notified.

## 2016-03-04 NOTE — ED Provider Notes (Signed)
[facility] DEPT Provider Note   CSN: [id number] Arrival date & time: 03/04/16  1537     History   Chief Complaint Chief Complaint  Patient presents with  . Code Sepsis    HPI Andrew Kline is a 50 y.o. male.  Patient states that he's had a fever and chills for 4 days. He has history of rectal cancer with surgery and has 2 nephrostomy tubes and colostomy. Patient getting chemotherapy his last chemotherapy was a week ago    Fever   This is a new problem. The current episode started more than 2 days ago. The problem occurs constantly. The problem has not changed since onset.The maximum temperature noted was 102 to 102.9 F. Pertinent negatives include no chest pain, no diarrhea, no congestion, no headaches and no cough. He has tried nothing for the symptoms. The treatment provided no relief.    Past Medical History:  Diagnosis Date  . Anxiety   . Arthritis   . Depression   . Kidney stone 03/22/15   left  . Rectal cancer ([place])   . Rectal pain   . Renal cell cancer ([place])    2012.   [name] Shortness of breath dyspnea     Patient Active Problem List   Diagnosis Date Noted  . Sepsis ([place]) 03/04/2016  . UTI (lower urinary tract infection) 01/25/2016  . Complicated UTI (urinary tract infection) 01/25/2016  . Pelvic fluid collection 11/06/2015  . Physical deconditioning 07/07/2015  . Muscular deconditioning 07/07/2015  . Malnutrition of moderate degree ([place]) 02/13/2015  . Rectal carcinoma ([place]) 02/11/2015  . Rectal cancer metastasized to lung ([place]) 10/30/2014  . Rectal mass   . Rectal pain 09/26/2014  . Multiple lung nodules on CT 09/26/2014    Past Surgical History:  Procedure Laterality Date  . BIOPSY N/A 09/30/2014   Procedure: BIOPSY;  Surgeon: Danie Binder, MD;  Location: [facility];  Service: Endoscopy;  Laterality: N/A;  Anal Canal  . COLOSTOMY N/A 02/11/2015   Procedure: COLOSTOMY;  Surgeon: Aviva Signs, MD;  Location: [facility];  Service: General;  Laterality: N/A;   . FLEXIBLE SIGMOIDOSCOPY N/A 09/30/2014   Procedure: FLEXIBLE SIGMOIDOSCOPY;  Surgeon: Danie Binder, MD;  Location: [facility];  Service: Endoscopy;  Laterality: N/A;  . FLEXIBLE SIGMOIDOSCOPY N/A 10/17/2014   Procedure: FLEXIBLE SIGMOIDOSCOPY;  Surgeon: Danie Binder, MD;  Location: [facility] SUITE;  Service: Endoscopy;  Laterality: N/A;  1325  . HYPOSPADIAS CORRECTION    . IR GENERIC HISTORICAL  12/17/2015   IR CATHETER TUBE CHANGE 12/17/2015 [facility]-INTER[facility] RAD  . IR GENERIC HISTORICAL  01/27/2016   IR NEPHROSTOMY PLACEMENT RIGHT 01/27/2016 [facility]  . IR GENERIC HISTORICAL  01/27/2016   IR NEPHROSTOMY PLACEMENT LEFT 01/27/2016 [facility]  . KNEE SURGERY Left    arthroscopy  . LAPAROSCOPIC PARTIAL NEPHRECTOMY  2012   right side  . LUNG BIOPSY Right   . PERINEAL PROCTECTOMY N/A 08/26/2015   Procedure:  PROCTECTOMY;  Surgeon: Aviva Signs, MD;  Location: [facility];  Service: General;  Laterality: N/A;  . RECTAL SURGERY         Home Medications    Prior to Admission medications   Medication Sig Start Date End Date Taking? Authorizing Provider  albuterol (PROVENTIL HFA;VENTOLIN HFA) 108 (90 Base) MCG/ACT inhaler Inhale 2 puffs into the lungs every 6 (six) hours as needed for wheezing or shortness of breath. 05/26/15  Yes Patrici Ranks, MD  amitriptyline (ELAVIL) 50 MG tablet Take 1 tablet (50  mg total) by mouth at bedtime. 12/31/15  Yes Patrici Ranks, MD  docusate sodium (COLACE) 100 MG capsule Take 1 capsule (100 mg total) by mouth every 12 (twelve) hours. Patient taking differently: Take 100 mg by mouth daily as needed for mild constipation or moderate constipation.  09/25/14  Yes Christopher Lawyer, PA-C  DULoxetine (CYMBALTA) 60 MG capsule Take 1 capsule (60 mg total) by mouth daily. 12/31/15  Yes Patrici Ranks, MD  fentaNYL (DURAGESIC - DOSED MCG/HR) 75 MCG/HR Place 2 patches (150 mcg total) onto the skin every 3 (three) days. 02/01/16  Yes Manon Hilding Kefalas, PA-C  HYDROmorphone  (DILAUDID) 4 MG tablet Take 1 tablet (4 mg total) by mouth every 4 (four) hours as needed for severe pain. 02/22/16  Yes Manon Hilding Kefalas, PA-C  pregabalin (LYRICA) 75 MG capsule Take 2 capsules (150 mg total) by mouth 2 (two) times daily. 12/31/15  Yes Patrici Ranks, MD  zolpidem (AMBIEN CR) 12.5 MG CR tablet Take 1 tablet (12.5 mg total) by mouth at bedtime as needed for sleep. 12/31/15  Yes Patrici Ranks, MD    Family History Family History  Problem Relation Age of Onset  . Healthy Mother   . Lung cancer Maternal Grandfather   . Colon cancer Maternal Uncle     age 66  . Diabetes Other     Social History Social History  Substance Use Topics  . Smoking status: Former Smoker    Packs/day: 0.50    Years: 30.00    Types: Cigarettes  . Smokeless tobacco: Never Used     Comment: a little over a pack daily  . Alcohol use 0.0 oz/week     Comment: Occ     Allergies   Oxaliplatin   Review of Systems Review of Systems  Constitutional: Positive for fever. Negative for appetite change and fatigue.  HENT: Negative for congestion, ear discharge and sinus pressure.   Eyes: Negative for discharge.  Respiratory: Negative for cough.   Cardiovascular: Negative for chest pain.  Gastrointestinal: Negative for abdominal pain and diarrhea.  Genitourinary: Negative for frequency and hematuria.  Musculoskeletal: Positive for back pain.  Skin: Negative for rash.  Neurological: Negative for seizures and headaches.  Psychiatric/Behavioral: Negative for hallucinations.     Physical Exam Updated Vital Signs BP (!) 90/45   Pulse 89   Temp 98.3 F (36.8 C) (Oral)   Resp 23   Ht '5\' 6"'$  (1.676 m)   Wt 180 lb (81.6 kg)   SpO2 96%   BMI 29.05 kg/m   Physical Exam  Constitutional: He is oriented to person, place, and time.  HENT:  Head: Normocephalic.  Eyes: Conjunctivae and EOM are normal. No scleral icterus.  Neck: Neck supple. No thyromegaly present.  Cardiovascular: Normal  rate and regular rhythm.  Exam reveals no gallop and no friction rub.   No murmur heard. Pulmonary/Chest: No stridor. He has no wheezes. He has no rales. He exhibits no tenderness.  Abdominal: He exhibits no distension. There is no tenderness. There is no rebound.  Pt has a colostomy.     Genitourinary:  Genitourinary Comments: Patient has tenderness and swelling to left buttocks  Musculoskeletal: Normal range of motion. He exhibits no edema.  Lymphadenopathy:    He has no cervical adenopathy.  Neurological: He is oriented to person, place, and time. He exhibits normal muscle tone. Coordination normal.  Skin: No rash noted. He is not diaphoretic. No erythema.  Psychiatric: He has a normal  mood and affect. His behavior is normal.     ED Treatments / Results  Labs (all labs ordered are listed, but only abnormal results are displayed) Labs Reviewed  COMPREHENSIVE METABOLIC PANEL - Abnormal; Notable for the following:       Result Value   Sodium 124 (*)    Potassium 3.4 (*)    Chloride 91 (*)    CO2 21 (*)    Glucose, Bld 166 (*)    Calcium 8.1 (*)    Albumin 3.1 (*)    Alkaline Phosphatase 154 (*)    Total Bilirubin 1.3 (*)    All other components within normal limits  CBC WITH DIFFERENTIAL/PLATELET - Abnormal; Notable for the following:    WBC 18.0 (*)    RDW 18.5 (*)    Neutro Abs 15.3 (*)    Lymphs Abs 0.6 (*)    Monocytes Absolute 2.1 (*)    All other components within normal limits  URINALYSIS, ROUTINE W REFLEX MICROSCOPIC (NOT AT [facility]) - Abnormal; Notable for the following:    Hgb urine dipstick LARGE (*)    Protein, ur 100 (*)    Leukocytes, UA LARGE (*)    All other components within normal limits  URINE MICROSCOPIC-ADD ON - Abnormal; Notable for the following:    Squamous Epithelial / LPF 0-5 (*)    Bacteria, UA MANY (*)    All other components within normal limits  I-STAT CHEM 8, ED - Abnormal; Notable for the following:    Sodium 128 (*)    Chloride 90 (*)      Glucose, Bld 168 (*)    Calcium, Ion 1.00 (*)    All other components within normal limits  CULTURE, BLOOD (ROUTINE X 2)  CULTURE, BLOOD (ROUTINE X 2)  URINE CULTURE  URINE CULTURE  I-STAT CG4 LACTIC ACID, ED  I-STAT CG4 LACTIC ACID, ED    EKG  EKG Interpretation None       Radiology Ct Abdomen Pelvis W Contrast  Result Date: 03/04/2016 CLINICAL DATA:  Fever, low back pain, LEFT foot pain, shortness of breath, tachycardia, generalized abdominal pain, history renal cell carcinoma, rectal cancer, kidney stones, former smoker EXAM: CT ABDOMEN AND PELVIS WITH CONTRAST TECHNIQUE: Multidetector CT imaging of the abdomen and pelvis was performed using the standard protocol following bolus administration of intravenous contrast. Sagittal and coronal MPR images reconstructed from axial data set. CONTRAST:  137m ISOVUE-300 IOPAMIDOL (ISOVUE-300) INJECTION 61% COMPARISON:  01/26/2016, 01/12/2016 FINDINGS: Lower chest: Bibasilar pulmonary metastases again identified. 2.1 x 1.7 cm diameter RIGHT lower lobe metastasis image 7 has cavitated centrally since previous exam. LEFT lung base metastasis 17 x 13 mm image 15 appeared larger on the previous exam at approximately 17 x 18 mm. Other nodules appear grossly stable Hepatobiliary: Mild diffuse fatty infiltration of liver. Hypervascular focus lateral segment LEFT lobe liver 11 mm diameter image 19 Pancreas: Chronic calcific pancreatitis changes. No definite pancreatic mass or fluid collection. Spleen: Normal appearance Adrenals/Urinary Tract: Adrenal glands normal appearance. BILATERAL nephrostomy tubes without renal mass or hydronephrosis. Normal caliber ureters and bladder. Suprapubic catheter in bladder. Mild diffuse bladder wall thickening which could reflect chronic infection, chronic outlet obstruction, or prior radiation therapy. Stomach/Bowel: Normal appendix. LEFT lower quadrant colostomy with parastomal herniation of nonobstructed small bowel  loops. Scattered stool throughout colon. Post perineal proctectomy per chart. Large gas and fluid collection and presacral space, with thickened wall overall measuring 6.2 x 5.9 x 9.2 cm. Collection appears larger and the  presence of air within the collection is new since the previous exam. Vascular/Lymphatic: Atherosclerotic calcifications aorta without aneurysm. Reproductive: N/A Other: No free air or free fluid. Musculoskeletal: Infiltrative changes and enlargement of the LEFT gluteus maximus, containing probable fluid and multiple foci of gas consistent with infection, potentially by gas-forming organism versus extension of the presacral space process. No acute osseous findings. IMPRESSION: Increase in size of a presacral collection now 6.2 x 5.9 x 9.2 cm, newly containing gas, question abscess. New enlargement and gas within the LEFT gluteus maximus likely representing infection, question by a gas-forming organism. Interval resolution of BILATERAL hydronephrosis post nephrostomy placement. 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 Pulmonary metastases, with a LEFT lower lobe lesion appearing slightly smaller and a RIGHT lower lead lesion now cavitated. Bladder wall thickening question due to chronic infection, chronic outlet obstruction, or prior pelvic irradiation. Chronic calcific pancreatitis. 11 mm hypervascular focus lateral segment LEFT lobe liver cannot exclude hypervascular metastasis. Findings called to Dr. Roderic Palau on 03/04/2016 at 1800 hours. Electronically Signed   By: Lavonia Dana M.D.   On: 03/04/2016 18:01   Dg Chest Port 1 View  Result Date: 03/04/2016 CLINICAL DATA:  Several days of fever, tachycardia, back pain, and shortness of breath. Patient recently hospitalized for severe kidney infection. EXAM: PORTABLE CHEST 1 VIEW COMPARISON:  Portable chest x-ray of January 25, 2016 FINDINGS: The lungs are adequately inflated.  The interstitial markings are prominent especially on the right. The heart is mildly enlarged. The central pulmonary vascularity is prominent. The mediastinum is normal in width. There is no pleural effusion. The power port catheter tip projects over the midportion of the SVC. The bony thorax is unremarkable. IMPRESSION: Mild interstitial prominence greatest on the left not as conspicuous as on the previous study. This likely reflects low-grade pulmonary edema. There is no alveolar pneumonia nor pleural effusion. When the patient can tolerate the procedure, a PA and lateral chest x-ray would be useful. Electronically Signed   By: David  Martinique M.D.   On: 03/04/2016 16:40    Procedures Procedures (including critical care time)  Medications Ordered in ED Medications  iopamidol (ISOVUE-300) 61 % injection (not administered)  sodium chloride 0.9 % bolus 2,500 mL (0 mLs Intravenous Stopped 03/04/16 1737)  piperacillin-tazobactam (ZOSYN) IVPB 3.375 g (0 g Intravenous Stopped 03/04/16 1641)  vancomycin (VANCOCIN) IVPB 1000 mg/200 mL premix (0 mg Intravenous Stopped 03/04/16 1735)  HYDROmorphone (DILAUDID) injection 1 mg (1 mg Intravenous Given 03/04/16 1614)  ondansetron (ZOFRAN) injection 4 mg (4 mg Intravenous Given 03/04/16 1615)  acetaminophen (TYLENOL) tablet 650 mg (650 mg Oral Given 03/04/16 1637)  iopamidol (ISOVUE-300) 61 % injection 100 mL (100 mLs Intravenous Contrast Given 03/04/16 1710)  ketorolac (TORADOL) 30 MG/ML injection 30 mg (30 mg Intravenous Given 03/04/16 1745)  sodium chloride 0.9 % bolus 1,000 mL (1,000 mLs Intravenous New Bag/Given 03/04/16 1745)     Initial Impression / Assessment and Plan / ED Course  I have reviewed the triage vital signs and the nursing notes.  Pertinent labs & imaging results that were available during my care of the patient were reviewed by me and considered in my medical decision making (see chart for details).  Clinical Course   CRITICAL CARE Performed  by: Malikah Principato L Total critical care time: 59mnutes Critical care time was exclusive of separately billable procedures and treating other patients. Critical care was necessary to treat or prevent imminent or life-threatening deterioration. Critical  care was time spent personally by me on the following activities: development of treatment plan with patient and/or surrogate as well as nursing, discussions with consultants, evaluation of patient's response to treatment, examination of patient, obtaining history from patient or surrogate, ordering and performing treatments and interventions, ordering and review of laboratory studies, ordering and review of radiographic studies, pulse oximetry and re-evaluation of patient's condition.  CT scan shows most likely abscess in the presacral area patient also the cellulitis left buttocks and is septic. General surgery was consult and felt like the patient should be transferred to [facility] for possible percutaneous drainage of infection. Medicine is admitting the patient to [facility]  Final Clinical Impressions(s) / ED Diagnoses   Final diagnoses:  Sepsis affecting skin [facility])    New Prescriptions New Prescriptions   No medications on file     Milton Ferguson, MD 03/04/16 [place]

## 2016-03-04 NOTE — ED Notes (Signed)
Dr Yates in to assess 

## 2016-03-04 NOTE — ED Notes (Signed)
Pt requesting more pain meds.  MD aware and due to pressure Toradol and additional fluids ordered.

## 2016-03-04 NOTE — ED Triage Notes (Signed)
Pt return after being disharged with with severe kidney infection. Fever, tachycardia, back pain, SOB

## 2016-03-04 NOTE — ED Notes (Signed)
Call to [place] for report unable to take report at this time

## 2016-03-05 ENCOUNTER — Inpatient Hospital Stay (HOSPITAL_COMMUNITY): Payer: Medicaid Other

## 2016-03-05 ENCOUNTER — Encounter (HOSPITAL_COMMUNITY): Payer: Self-pay | Admitting: Physician Assistant

## 2016-03-05 DIAGNOSIS — K651 Peritoneal abscess: Secondary | ICD-10-CM | POA: Diagnosis present

## 2016-03-05 DIAGNOSIS — L0291 Cutaneous abscess, unspecified: Secondary | ICD-10-CM

## 2016-03-05 LAB — CBC
HCT: 32.5 % — ABNORMAL LOW (ref 39.0–52.0)
Hemoglobin: 10.8 g/dL — ABNORMAL LOW (ref 13.0–17.0)
MCH: 27.8 pg (ref 26.0–34.0)
MCHC: 33.2 g/dL (ref 30.0–36.0)
MCV: 83.8 fL (ref 78.0–100.0)
PLATELETS: 230 10*3/uL (ref 150–400)
RBC: 3.88 MIL/uL — ABNORMAL LOW (ref 4.22–5.81)
RDW: 18.9 % — AB (ref 11.5–15.5)
WBC: 13.3 10*3/uL — ABNORMAL HIGH (ref 4.0–10.5)

## 2016-03-05 LAB — BASIC METABOLIC PANEL
Anion gap: 7 (ref 5–15)
BUN: 8 mg/dL (ref 6–20)
CHLORIDE: 104 mmol/L (ref 101–111)
CO2: 22 mmol/L (ref 22–32)
CREATININE: 0.91 mg/dL (ref 0.61–1.24)
Calcium: 7.1 mg/dL — ABNORMAL LOW (ref 8.9–10.3)
GFR calc Af Amer: >60 mL/min (ref >60)
GFR calc non Af Amer: >60 mL/min (ref >60)
GLUCOSE: 145 mg/dL — AB (ref 65–99)
Potassium: 3.6 mmol/L (ref 3.5–5.1)
SODIUM: 133 mmol/L — AB (ref 135–145)

## 2016-03-05 LAB — LACTIC ACID, PLASMA
LACTIC ACID, VENOUS: 1.2 mmol/L (ref 0.5–1.9)
Lactic Acid, Venous: 1.9 mmol/L (ref 0.5–1.9)

## 2016-03-05 LAB — MRSA PCR SCREENING: MRSA by PCR: NEGATIVE

## 2016-03-05 LAB — PROCALCITONIN: PROCALCITONIN: 5.85 ng/mL

## 2016-03-05 MED ORDER — LIDOCAINE HCL 1 % IJ SOLN
INTRAMUSCULAR | Status: AC
  Filled 2016-03-05: qty 20

## 2016-03-05 MED ORDER — MIDAZOLAM HCL 2 MG/2ML IJ SOLN
INTRAMUSCULAR | Status: AC
  Filled 2016-03-05: qty 4

## 2016-03-05 MED ORDER — MIDAZOLAM HCL 2 MG/2ML IJ SOLN
INTRAMUSCULAR | Status: AC | PRN
  Administered 2016-03-05 (×2): 1 mg via INTRAVENOUS

## 2016-03-05 MED ORDER — FENTANYL CITRATE (PF) 100 MCG/2ML IJ SOLN
INTRAMUSCULAR | Status: AC
  Filled 2016-03-05: qty 4

## 2016-03-05 MED ORDER — FENTANYL CITRATE (PF) 100 MCG/2ML IJ SOLN
INTRAMUSCULAR | Status: AC | PRN
  Administered 2016-03-05 (×4): 50 ug via INTRAVENOUS

## 2016-03-05 NOTE — Procedures (Signed)
Recurrent presacral pelvic abscess, postop  S/p CT LT TRANSGLUTEAL 10FR PELVIC DRAIN  No comp Stable 50cc exudative, fecal fld aspirated. 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

## 2016-03-05 NOTE — Sedation Documentation (Signed)
Patient is resting

## 2016-03-05 NOTE — Sedation Documentation (Signed)
Vital signs stable. 

## 2016-03-05 NOTE — Consult Note (Signed)
[place] Nurse ostomy consult note: Patient returned to unit immediately after my departure.  I returned to change pouching system for Nursing staff. Stoma type/location: LLQ Colostomy Stomal assessment/size: 1 and 5/8 inch oval ostomy, pale pink, moist, os in center Peristomal assessment: intact.  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  Patient states is occurred after an intense period of coughing. Pouch wear time has been minimally impacted, systems are now lasting 1-2 days (vs 3-4). Treatment options for stomal/peristomal skin: None today Output: brown, pasty stool Ostomy pouching: 2pc. 2 and 1/4 inch pouching system with skin barrier ring Education provided: Patient assured that he has 3 pouching setups in room and that Nursing staff will know how to obtain more if required. Enrolled patient in [place] program: Yes (previous admission) [place] nursing team will not follow routinely, but will remain available to this patient, the nursing and medical teams.  Please re-consult if needed. Thanks, Maudie Flakes, MSN, RN, [place], Arther Abbott  Pager# [phone]

## 2016-03-05 NOTE — H&P (Signed)
Chief Complaint: Pre-sacral abscess  Referring Physician(s): Karmen Bongo  Supervising Physician: Daryll Brod  Patient Status: [facility] - In-pt  History of Present Illness: Andrew Kline is a 50 y.o. male who is well known to our service.  He has a history of metastatic rectal CA with colostomy, renal cell carcinoma of the right kidney s/p partial nephrectomy and now with B nephrostomy tubes and prior presacral abscess requiring admission from 9/25-10/1 presenting with recurrent infection.    He reports that for the last 3-4 days he has slept nonstop.    Moving around is difficult, hard to change positions.   In the ER he was had a temp of 102.4 in ER.  He c/o low back pain and into buttocks and radiation into abdomen.    On prior admission, patient was noted to have complicated GU/rectal anatomy due to prior cancer with resection.    He had placement of bilateral PCNs by Dr. Pascal Lux on 01/27/2016  CT scan reveals increase in size of a presacral collection now 6.2 x 5.9 x 9.2 cm, newly containing gas.  He is NPO.  He did have sub-cu Lovenox last pm at 2300 - Dr. Annamaria Boots aware - benefit of going forward with procedure outweighs risk.   Past Medical History:  Diagnosis Date  . Anxiety   . Arthritis   . Depression   . Kidney stone 03/22/15   left  . Rectal cancer ([place])   . Rectal pain   . Renal cell cancer ([place])    2012.   [name] Shortness of breath dyspnea     Past Surgical History:  Procedure Laterality Date  . BIOPSY N/A 09/30/2014   Procedure: BIOPSY;  Surgeon: Danie Binder, MD;  Location: [facility];  Service: Endoscopy;  Laterality: N/A;  Anal Canal  . COLOSTOMY N/A 02/11/2015   Procedure: COLOSTOMY;  Surgeon: Aviva Signs, MD;  Location: [facility];  Service: General;  Laterality: N/A;  . FLEXIBLE SIGMOIDOSCOPY N/A 09/30/2014   Procedure: FLEXIBLE SIGMOIDOSCOPY;  Surgeon: Danie Binder, MD;  Location: [facility];  Service: Endoscopy;  Laterality: N/A;  . FLEXIBLE  SIGMOIDOSCOPY N/A 10/17/2014   Procedure: FLEXIBLE SIGMOIDOSCOPY;  Surgeon: Danie Binder, MD;  Location: [facility] SU[facility];  Service: Endoscopy;  Laterality: N/A;  1325  . HYPOSPADIAS CORRECTION    . IR GENERIC HISTORICAL  12/17/2015   IR CATHETER TUBE CHANGE 12/17/2015 [facility]-INTERV RAD  . IR GENERIC HISTORICAL  01/27/2016   IR NEPHROSTOMY PLACEMENT RIGHT 01/27/2016 [facility]-INTERV RAD  . IR GENERIC HISTORICAL  01/27/2016   IR NEPHROSTOMY PLACEMENT LEFT 01/27/2016 [facility]INTER[facility]  . KNEE SURGERY Left    arthroscopy  . LAPAROSCOPIC PARTIAL NEPHRECTOMY  2012   right side  . LUNG BIOPSY Right   . PERINEAL PROCTECTOMY N/A 08/26/2015   Procedure:  PROCTECTOMY;  Surgeon: Aviva Signs, MD;  Location: [facility];  Service: General;  Laterality: N/A;  . RECTAL SURGERY      Allergies: Oxaliplatin  Medications: Prior to Admission medications   Medication Sig Start Date End Date Taking? Authorizing Provider  albuterol (PROVENTIL HFA;VENTOLIN HFA) 108 (90 Base) MCG/ACT inhaler Inhale 2 puffs into the lungs every 6 (six) hours as needed for wheezing or shortness of breath. 05/26/15  Yes Patrici Ranks, MD  amitriptyline (ELAVIL) 50 MG tablet Take 1 tablet (50 mg total) by mouth at bedtime. 12/31/15  Yes Patrici Ranks, MD  docusate sodium (COLACE) 100 MG capsule Take 1 capsule (100 mg total) by mouth every  12 (twelve) hours. Patient taking differently: Take 100 mg by mouth daily as needed for mild constipation or moderate constipation.  09/25/14  Yes Christopher Lawyer, PA-C  DULoxetine (CYMBALTA) 60 MG capsule Take 1 capsule (60 mg total) by mouth daily. 12/31/15  Yes Patrici Ranks, MD  fentaNYL (DURAGESIC - DOSED MCG/HR) 75 MCG/HR Place 2 patches (150 mcg total) onto the skin every 3 (three) days. 02/01/16  Yes Manon Hilding Kefalas, PA-C  HYDROmorphone (DILAUDID) 4 MG tablet Take 1 tablet (4 mg total) by mouth every 4 (four) hours as needed for severe pain. 02/22/16  Yes Manon Hilding Kefalas, PA-C  pregabalin (LYRICA) 75 MG  capsule Take 2 capsules (150 mg total) by mouth 2 (two) times daily. 12/31/15  Yes Patrici Ranks, MD  zolpidem (AMBIEN CR) 12.5 MG CR tablet Take 1 tablet (12.5 mg total) by mouth at bedtime as needed for sleep. 12/31/15  Yes Patrici Ranks, MD     Family History  Problem Relation Age of Onset  . Healthy Mother   . Lung cancer Maternal Grandfather   . Colon cancer Maternal Uncle     age 64  . Diabetes Other     Social History   Social History  . Marital status: Divorced    Spouse name: N/A  . Number of children: 2  . Years of education: N/A   Occupational History  . disabled    Social History Main Topics  . Smoking status: Current Every Day Smoker    Packs/day: 0.50    Years: 30.00    Types: Cigarettes  . Smokeless tobacco: Never Used  . Alcohol use 0.0 oz/week     Comment: Occ  . Drug use: No  . Sexual activity: Yes    Birth control/ protection: None   Other Topics Concern  . None   Social History Narrative  . None   Review of Systems: A 12 point ROS discussed  Review of Systems  Constitutional: Positive for activity change, chills, fatigue and fever.  HENT: Negative.   Respiratory: Negative for cough and shortness of breath.   Cardiovascular: Negative for chest pain.  Gastrointestinal: Negative for abdominal pain.  Genitourinary:       Pelvic pain  Musculoskeletal: Positive for myalgias.  Skin: Negative.   Neurological: Negative.   Hematological: Negative.   Psychiatric/Behavioral: Negative.     Vital Signs: BP (!) 109/55 (BP Location: Right Arm)   Pulse 96   Temp 100.3 F (37.9 C) (Oral)   Resp (!) 30   Ht '5\' 5"'$  (1.651 m)   Wt 218 lb (98.9 kg) Comment: Weight taken by RN  SpO2 97%   BMI 36.28 kg/m   Physical Exam  Constitutional: He is oriented to person, place, and time. He appears well-developed and well-nourished.  HENT:  Head: Normocephalic and atraumatic.  Eyes: EOM are normal.  Neck: Normal range of motion.  Cardiovascular:  Normal rate, regular rhythm and normal heart sounds.   Pulmonary/Chest: Effort normal and breath sounds normal. No respiratory distress.  Abdominal: Soft. He exhibits no distension.  Neurological: He is alert and oriented to person, place, and time.  Skin: He is diaphoretic.  Psychiatric: He has a normal mood and affect. His behavior is normal. Judgment and thought content normal.  Vitals reviewed. Bilateral nephrostomy tubes in place Suprapubic cath in place.  Mallampati Score:  MD Evaluation Airway: WNL Heart: WNL Abdomen: WNL Chest/ Lungs: WNL ASA  Classification: 3 Mallampati/Airway Score: One  Imaging: Ct Abdomen  Pelvis W Contrast  Result Date: 03/04/2016 CLINICAL DATA:  Fever, low back pain, LEFT foot pain, shortness of breath, tachycardia, generalized abdominal pain, history renal cell carcinoma, rectal cancer, kidney stones, former smoker EXAM: CT ABDOMEN AND PELVIS WITH CONTRAST TECHNIQUE: Multidetector CT imaging of the abdomen and pelvis was performed using the standard protocol following bolus administration of intravenous contrast. Sagittal and coronal MPR images reconstructed from axial data set. CONTRAST:  118m ISOVUE-300 IOPAMIDOL (ISOVUE-300) INJECTION 61% COMPARISON:  01/26/2016, 01/12/2016 FINDINGS: Lower chest: Bibasilar pulmonary metastases again identified. 2.1 x 1.7 cm diameter RIGHT lower lobe metastasis image 7 has cavitated centrally since previous exam. LEFT lung base metastasis 17 x 13 mm image 15 appeared larger on the previous exam at approximately 17 x 18 mm. Other nodules appear grossly stable Hepatobiliary: Mild diffuse fatty infiltration of liver. Hypervascular focus lateral segment LEFT lobe liver 11 mm diameter image 19 Pancreas: Chronic calcific pancreatitis changes. No definite pancreatic mass or fluid collection. Spleen: Normal appearance Adrenals/Urinary Tract: Adrenal glands normal appearance. BILATERAL nephrostomy tubes without renal mass or  hydronephrosis. Normal caliber ureters and bladder. Suprapubic catheter in bladder. Mild diffuse bladder wall thickening which could reflect chronic infection, chronic outlet obstruction, or prior radiation therapy. Stomach/Bowel: Normal appendix. LEFT lower quadrant colostomy with parastomal herniation of nonobstructed small bowel loops. Scattered stool throughout colon. Post perineal proctectomy per chart. Large gas and fluid collection and presacral space, with thickened wall overall measuring 6.2 x 5.9 x 9.2 cm. Collection appears larger and the presence of air within the collection is new since the previous exam. Vascular/Lymphatic: Atherosclerotic calcifications aorta without aneurysm. Reproductive: N/A Other: No free air or free fluid. Musculoskeletal: Infiltrative changes and enlargement of the LEFT gluteus maximus, containing probable fluid and multiple foci of gas consistent with infection, potentially by gas-forming organism versus extension of the presacral space process. No acute osseous findings. IMPRESSION: Increase in size of a presacral collection now 6.2 x 5.9 x 9.2 cm, newly containing gas, question abscess. New enlargement and gas within the LEFT gluteus maximus likely representing infection, question by a gas-forming organism. Interval resolution of BILATERAL hydronephrosis post nephrostomy placement. 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 Pulmonary metastases, with a LEFT lower lobe lesion appearing slightly smaller and a RIGHT lower lead lesion now cavitated. Bladder wall thickening question due to chronic infection, chronic outlet obstruction, or prior pelvic irradiation. Chronic calcific pancreatitis. 11 mm hypervascular focus lateral segment LEFT lobe liver cannot exclude hypervascular metastasis. Findings called to Dr. ZRoderic Palauon 03/04/2016 at 1800 hours. Electronically Signed   By: MLavonia DanaM.D.   On: 03/04/2016  18:01   Dg Chest Port 1 View  Result Date: 03/04/2016 CLINICAL DATA:  Several days of fever, tachycardia, back pain, and shortness of breath. Patient recently hospitalized for severe kidney infection. EXAM: PORTABLE CHEST 1 VIEW COMPARISON:  Portable chest x-ray of January 25, 2016 FINDINGS: The lungs are adequately inflated. The interstitial markings are prominent especially on the right. The heart is mildly enlarged. The central pulmonary vascularity is prominent. The mediastinum is normal in width. There is no pleural effusion. The power port catheter tip projects over the midportion of the SVC. The bony thorax is unremarkable. IMPRESSION: Mild interstitial prominence greatest on the left not as conspicuous as on the previous study. This likely reflects low-grade pulmonary edema. There is no alveolar pneumonia nor pleural effusion. When the patient can tolerate the procedure, a PA and lateral chest  x-ray would be useful. Electronically Signed   By: David  Martinique M.D.   On: 03/04/2016 16:40    Labs:  CBC:  Recent Labs  02/09/16 0854 02/23/16 0915 03/04/16 1610 03/04/16 1616 03/05/16 0122  WBC 9.6 6.0  --  18.0* 13.3*  HGB 13.7 12.0* 14.6 13.5 10.8*  HCT 42.2 37.5* 43.0 39.8 32.5*  PLT 338 253  --  288 230    COAGS:  Recent Labs  09/23/15 0900 11/12/15 0945 01/26/16 1540 03/04/16 2313  INR 1.15 1.12 1.00 1.33  APTT 38*  --   --  41*    BMP:  Recent Labs  02/09/16 0854 02/23/16 0915 03/04/16 1610 03/04/16 1616 03/05/16 0122  NA 133* 138 128* 124* 133*  K 3.5 3.1* 3.6 3.4* 3.6  CL 102 113* 90* 91* 104  CO2 24 21*  --  21* 22  GLUCOSE 212* 201* 168* 166* 145*  BUN '8 9 7 8 8  '$ CALCIUM 8.8* 7.3*  --  8.1* 7.1*  CREATININE 1.02 0.73 0.90 0.98 0.91  GFRNONAA >60 >60  --  >60 >60  GFRAA >60 >60  --  >60 >60    LIVER FUNCTION TESTS:  Recent Labs  01/26/16 0546 02/09/16 0854 02/23/16 0915 03/04/16 1616  BILITOT 0.4 0.2* 0.3 1.3*  AST 14* 18 18 40  ALT 14*  13* 17 33  ALKPHOS 89 99 87 154*  PROT 6.2* 7.4 5.8* 7.2  ALBUMIN 2.8* 3.5 2.9* 3.1*    TUMOR MARKERS:  Recent Labs  08/18/15 0835 09/08/15 0903 11/06/15 1245 02/09/16 0854  CEA 3.2 2.1 3.9 6.0*    Assessment and Plan:  Increase in size of a presacral collection now 6.2 x 5.9 x 9.2 cm newly containing gas  Will proceed with image guided drain placement today by Dr. Annamaria Boots.  Risks and Benefits discussed with the patient including bleeding, infection, damage to adjacent structures, bowel perforation/fistula connection, and sepsis.  All of the patient's questions were answered, patient is agreeable to proceed. Consent signed and in chart.   Electronically Signed: Murrell Redden PA-C 03/05/2016, 9:09 AM   I spent a total of  25 Minutes in face to face in clinical consultation, greater than 50% of which was counseling/coordinating care for drain placement

## 2016-03-05 NOTE — Sedation Documentation (Signed)
Drain placed.

## 2016-03-05 NOTE — Consult Note (Signed)
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 male with history of metastatic rectal cancer s/p FOLFOX +avastin in July 2016-jan 2017, and FOLFIRI +avastin in Feb-Mar 2017. 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 He has had intermittent pelivc abscess drained by IR in July 2017. He was admitted recently from 9/25-1/01 for what was though to have complicate uti, but underwent bilateral PCN per Dr Tomasa Rand recommendations. He was seen in follow up on 10/23 doing well, HE was in the infusion center on 10/26 for 5FU infusion and tolerated it without difficulty. He was then admitted on 11/3 for fever, back pain radiating into buttocks and abdomen concerning for kidney infection. CT imaging suggested that he had new large fluid collection with gas measuring 6.2 x 5.9 x 9.2cm. He was started on vancomycin plus piptazo and transferred from [facility] to [facility] for further management. Dr Arnoldo Morale evaluated the patient at [facility] and felt that the fluid collection could be drained by IR. THey were able to place a left transgluteal 93fench pelvic drain which yielded 565mof exudative, feculant material which is suspicious for vesiculo-colo fistula. Patient has low grade temp of 100.3 since being placed on abtx. Blood cx x 4 sets but plus urine cx have been drawn on 11/3. His mrsa screen nares is negative  Past Medical History:    Diagnosis Date  . Anxiety   . Arthritis   . Depression   . Kidney stone 03/22/15   left  . Rectal cancer ([facility]  . Rectal pain   . [place]al cell cancer ([facility]   2012.   . Marland Kitchenhortness of breath dyspnea     Allergies:  Allergies  Allergen Reactions  . Oxaliplatin Itching     MEDICATIONS: . lidocaine      . amitriptyline  50 mg Oral QHS  . clindamycin (CLEOCIN) IV  600 mg Intravenous Q8H  . DULoxetine  60 mg Oral Daily  . enoxaparin (LOVENOX) injection  40 mg Subcutaneous Q24H  . [START ON 03/06/2016] fentaNYL  150 mcg Transdermal Q72H  . fentaNYL      . midazolam      . pregabalin  150 mg Oral BID  . sodium chloride flush  3 mL Intravenous Q12H    Social History  Substance Use Topics  . Smoking status: Current Every Day Smoker    Packs/day: 0.50    Years: 30.00    Types: Cigarettes  . Smokeless tobacco: Never Used  . Alcohol use 0.0 oz/week     Comment: Occ    Family History  Problem Relation Age of Onset  . Healthy Mother   . Lung cancer Maternal Grandfather   . Colon cancer Maternal Uncle     age 52. Diabetes Other  Review of Systems  Constitutional: positive for fever, chills, diaphoresis, activity change, appetite change, fatigue and unexpected weight change.  HENT: Negative for congestion, sore throat, rhinorrhea, sneezing, trouble swallowing and sinus pressure.  Eyes: Negative for photophobia and visual disturbance.  Respiratory: Negative for cough, chest tightness, shortness of breath, wheezing and stridor.  Cardiovascular: Negative for chest pain, palpitations and leg swelling.  Gastrointestinal: Negative for nausea, vomiting, abdominal pain, diarrhea, constipation, blood in stool, abdominal distention and anal bleeding.  Genitourinary: Negative for dysuria, hematuria, flank pain and difficulty urinating.  Musculoskeletal: Negative for myalgias, back pain, joint swelling, arthralgias and gait problem.  Skin: Negative for color change, pallor, rash  and wound.  Neurological: Negative for dizziness, tremors, weakness and light-headedness.  Hematological: Negative for adenopathy. Does not bruise/bleed easily.  Psychiatric/Behavioral: Negative for behavioral problems, confusion, sleep disturbance, dysphoric mood, decreased concentration and agitation.     OBJECTIVE: Temp:  [97.8 F (36.6 C)-102.4 F (39.1 C)] 100.3 F (37.9 C) (11/04 0803) Pulse Rate:  [82-124] 103 (11/04 1228) Resp:  [13-30] 22 (11/04 1228) BP: (78-128)/(45-78) 109/57 (11/04 1228) SpO2:  [90 %-100 %] 90 % (11/04 1228) Weight:  [180 lb (81.6 kg)-218 lb (98.9 kg)] 218 lb (98.9 kg) (11/03 2228) Physical Exam  Constitutional: He is oriented to person, place, and time. He appears well-developed and well-nourished. No distress.  HENT:  Mouth/Throat: Oropharynx is clear and moist. No oropharyngeal exudate.  Cardiovascular: Normal rate, regular rhythm and normal heart sounds. Exam reveals no gallop and no friction rub.  No murmur heard.  Pulmonary/Chest: Effort normal and breath sounds normal. No respiratory distress. He has no wheezes.  Abdominal: Soft. Bowel sounds are normal. He exhibits no distension. There is no tenderness. GU+ bilateral PCN, also has left gluteal drain in place  Lymphadenopathy:  He has no cervical adenopathy.  Neurological: He is alert and oriented to person, place, and time.  Skin: Skin is warm and dry. No rash noted. No erythema.  Psychiatric: He has a normal mood and affect. His behavior is normal.     LABS: Results for orders placed or performed during the hospital encounter of 03/04/16 (from the past 48 hour(s))  I-stat chem 8, ed     Status: Abnormal   Collection Time: 03/04/16  4:10 PM  Result Value Ref Range   Sodium 128 (L) 135 - 145 mmol/L   Potassium 3.6 3.5 - 5.1 mmol/L   Chloride 90 (L) 101 - 111 mmol/L   BUN 7 6 - 20 mg/dL   Creatinine, Ser 0.90 0.61 - 1.24 mg/dL   Glucose, Bld 168 (H) 65 - 99 mg/dL   Calcium, Ion 1.00 (L)  1.15 - 1.40 mmol/L   TCO2 23 0 - 100 mmol/L   Hemoglobin 14.6 13.0 - 17.0 g/dL   HCT 43.0 39.0 - 52.0 %  I-Stat CG4 Lactic Acid, ED     Status: Abnormal   Collection Time: 03/04/16  4:14 PM  Result Value Ref Range   Lactic Acid, Venous 3.40 (HH) 0.5 - 1.9 mmol/L   Comment NOTIFIED PHYSICIAN   Comprehensive metabolic panel     Status: Abnormal   Collection Time: 03/04/16  4:16 PM  Result Value Ref Range   Sodium 124 (L) 135 - 145 mmol/L   Potassium 3.4 (L) 3.5 - 5.1 mmol/L   Chloride 91 (L) 101 - 111 mmol/L   CO2 21 (L) 22 - 32 mmol/L   Glucose, Bld 166 (H) 65 - 99 mg/dL   BUN 8 6 -  20 mg/dL   Creatinine, Ser 0.98 0.61 - 1.24 mg/dL   Calcium 8.1 (L) 8.9 - 10.3 mg/dL   Total Protein 7.2 6.5 - 8.1 g/dL   Albumin 3.1 (L) 3.5 - 5.0 g/dL   AST 40 15 - 41 U/L   ALT 33 17 - 63 U/L   Alkaline Phosphatase 154 (H) 38 - 126 U/L   Total Bilirubin 1.3 (H) 0.3 - 1.2 mg/dL   GFR calc non Af Amer >60 >60 mL/min   GFR calc Af Amer >60 >60 mL/min    Comment: (NOTE) The eGFR has been calculated using the CKD EPI equation. This calculation has not been validated in all clinical situations. eGFR's persistently <60 mL/min signify possible Chronic Kidney Disease.    Anion gap 12 5 - 15  CBC with Differential     Status: Abnormal   Collection Time: 03/04/16  4:16 PM  Result Value Ref Range   WBC 18.0 (H) 4.0 - 10.5 K/uL   RBC 4.75 4.22 - 5.81 MIL/uL   Hemoglobin 13.5 13.0 - 17.0 g/dL   HCT 39.8 39.0 - 52.0 %   MCV 83.8 78.0 - 100.0 fL   MCH 28.4 26.0 - 34.0 pg   MCHC 33.9 30.0 - 36.0 g/dL   RDW 18.5 (H) 11.5 - 15.5 %   Platelets 288 150 - 400 K/uL   Neutrophils Relative % 85 %   Neutro Abs 15.3 (H) 1.7 - 7.7 K/uL   Lymphocytes Relative 4 %   Lymphs Abs 0.6 (L) 0.7 - 4.0 K/uL   Monocytes Relative 11 %   Monocytes Absolute 2.1 (H) 0.1 - 1.0 K/uL   Eosinophils Relative 0 %   Eosinophils Absolute 0.0 0.0 - 0.7 K/uL   Basophils Relative 0 %   Basophils Absolute 0.0 0.0 - 0.1 K/uL  Culture,  blood (Routine x 2)     Status: None (Preliminary result)   Collection Time: 03/04/16  4:16 PM  Result Value Ref Range   Specimen Description BLOOD RIGHT HAND    Special Requests BOTTLES DRAWN AEROBIC AND ANAEROBIC 8CC EACH    Culture NO GROWTH < 24 HOURS    Report Status PENDING   Culture, blood (Routine x 2)     Status: None (Preliminary result)   Collection Time: 03/04/16  4:17 PM  Result Value Ref Range   Specimen Description LEFT ANTECUBITAL    Special Requests BOTTLES DRAWN AEROBIC AND ANAEROBIC 6CC EACH    Culture NO GROWTH < 24 HOURS    Report Status PENDING   Urinalysis, Routine w reflex microscopic     Status: Abnormal   Collection Time: 03/04/16  4:37 PM  Result Value Ref Range   Color, Urine YELLOW YELLOW   APPearance CLEAR CLEAR   Specific Gravity, Urine 1.015 1.005 - 1.030   pH 6.0 5.0 - 8.0   Glucose, UA NEGATIVE NEGATIVE mg/dL   Hgb urine dipstick LARGE (A) NEGATIVE   Bilirubin Urine NEGATIVE NEGATIVE   Ketones, ur NEGATIVE NEGATIVE mg/dL   Protein, ur 100 (A) NEGATIVE mg/dL   Nitrite NEGATIVE NEGATIVE   Leukocytes, UA LARGE (A) NEGATIVE  Urine microscopic-add on     Status: Abnormal   Collection Time: 03/04/16  4:37 PM  Result Value Ref Range   Squamous Epithelial / LPF 0-5 (A) NONE SEEN   WBC, UA TOO NUMEROUS TO COUNT 0 - 5 WBC/hpf   RBC / HPF TOO NUMEROUS TO COUNT 0 - 5 RBC/hpf   Bacteria, UA MANY (A)  NONE SEEN   Urine-Other YEAST PRESENT   I-Stat CG4 Lactic Acid, ED     Status: None   Collection Time: 03/04/16  7:30 PM  Result Value Ref Range   Lactic Acid, Venous 1.11 0.5 - 1.9 mmol/L  MRSA PCR Screening     Status: None   Collection Time: 03/04/16 10:37 PM  Result Value Ref Range   MRSA by PCR NEGATIVE NEGATIVE    Comment:        The GeneXpert MRSA Assay (FDA approved for NASAL specimens only), is one component of a comprehensive MRSA colonization surveillance program. It is not intended to diagnose MRSA infection nor to guide or monitor  treatment for MRSA infections.   Lactic acid, plasma     Status: None   Collection Time: 03/04/16 11:13 PM  Result Value Ref Range   Lactic Acid, Venous 1.9 0.5 - 1.9 mmol/L  Procalcitonin     Status: None   Collection Time: 03/04/16 11:13 PM  Result Value Ref Range   Procalcitonin 5.85 ng/mL    Comment:        Interpretation: PCT > 2 ng/mL: Systemic infection (sepsis) is likely, unless other causes are known. (NOTE)         ICU PCT Algorithm               Non ICU PCT Algorithm    ----------------------------     ------------------------------         PCT < 0.25 ng/mL                 PCT < 0.1 ng/mL     Stopping of antibiotics            Stopping of antibiotics       strongly encouraged.               strongly encouraged.    ----------------------------     ------------------------------       PCT level decrease by               PCT < 0.25 ng/mL       >= 80% from peak PCT       OR PCT 0.25 - 0.5 ng/mL          Stopping of antibiotics                                             encouraged.     Stopping of antibiotics           encouraged.    ----------------------------     ------------------------------       PCT level decrease by              PCT >= 0.25 ng/mL       < 80% from peak PCT        AND PCT >= 0.5 ng/mL            Continuing antibiotics                                               encouraged.       Continuing antibiotics            encouraged.    ----------------------------     ------------------------------  PCT level increase compared          PCT > 0.5 ng/mL         with peak PCT AND          PCT >= 0.5 ng/mL             Escalation of antibiotics                                          strongly encouraged.      Escalation of antibiotics        strongly encouraged.   Protime-INR     Status: Abnormal   Collection Time: 03/04/16 11:13 PM  Result Value Ref Range   Prothrombin Time 16.6 (H) 11.4 - 15.2 seconds   INR 1.33   APTT     Status: Abnormal    Collection Time: 03/04/16 11:13 PM  Result Value Ref Range   aPTT 41 (H) 24 - 36 seconds    Comment:        IF BASELINE aPTT IS ELEVATED, SUGGEST PATIENT RISK ASSESSMENT BE USED TO DETERMINE APPROPRIATE ANTICOAGULANT THERAPY.   Lactic acid, plasma     Status: None   Collection Time: 03/05/16  1:07 AM  Result Value Ref Range   Lactic Acid, Venous 1.2 0.5 - 1.9 mmol/L  Basic metabolic panel     Status: Abnormal   Collection Time: 03/05/16  1:22 AM  Result Value Ref Range   Sodium 133 (L) 135 - 145 mmol/L   Potassium 3.6 3.5 - 5.1 mmol/L   Chloride 104 101 - 111 mmol/L   CO2 22 22 - 32 mmol/L   Glucose, Bld 145 (H) 65 - 99 mg/dL   BUN 8 6 - 20 mg/dL   Creatinine, Ser 0.91 0.61 - 1.24 mg/dL   Calcium 7.1 (L) 8.9 - 10.3 mg/dL   GFR calc non Af Amer >60 >60 mL/min   GFR calc Af Amer >60 >60 mL/min    Comment: (NOTE) The eGFR has been calculated using the CKD EPI equation. This calculation has not been validated in all clinical situations. eGFR's persistently <60 mL/min signify possible Chronic Kidney Disease.    Anion gap 7 5 - 15  CBC     Status: Abnormal   Collection Time: 03/05/16  1:22 AM  Result Value Ref Range   WBC 13.3 (H) 4.0 - 10.5 K/uL   RBC 3.88 (L) 4.22 - 5.81 MIL/uL   Hemoglobin 10.8 (L) 13.0 - 17.0 g/dL   HCT 32.5 (L) 39.0 - 52.0 %   MCV 83.8 78.0 - 100.0 fL   MCH 27.8 26.0 - 34.0 pg   MCHC 33.2 30.0 - 36.0 g/dL   RDW 18.9 (H) 11.5 - 15.5 %   Platelets 230 150 - 400 K/uL    MICRO: 11/3 blood cx pending 11/3 intra-ab aspirate pending 11/3 ua yeast IMAGING: Ct Abdomen Pelvis W Contrast  Result Date: 03/04/2016 CLINICAL DATA:  Fever, low back pain, LEFT foot pain, shortness of breath, tachycardia, generalized abdominal pain, history renal cell carcinoma, rectal cancer, kidney stones, former smoker EXAM: CT ABDOMEN AND PELVIS WITH CONTRAST TECHNIQUE: Multidetector CT imaging of the abdomen and pelvis was performed using the standard protocol following bolus  administration of intravenous contrast. Sagittal and coronal MPR images reconstructed from axial data set. CONTRAST:  145m ISOVUE-300 IOPAMIDOL (ISOVUE-300) INJECTION 61% COMPARISON:  01/26/2016, 01/12/2016 FINDINGS:  Lower chest: Bibasilar pulmonary metastases again identified. 2.1 x 1.7 cm diameter RIGHT lower lobe metastasis image 7 has cavitated centrally since previous exam. LEFT lung base metastasis 17 x 13 mm image 15 appeared larger on the previous exam at approximately 17 x 18 mm. Other nodules appear grossly stable Hepatobiliary: Mild diffuse fatty infiltration of liver. Hypervascular focus lateral segment LEFT lobe liver 11 mm diameter image 19 Pancreas: Chronic calcific pancreatitis changes. No definite pancreatic mass or fluid collection. Spleen: Normal appearance Adrenals/Urinary Tract: Adrenal glands normal appearance. BILATERAL nephrostomy tubes without renal mass or hydronephrosis. Normal caliber ureters and bladder. Suprapubic catheter in bladder. Mild diffuse bladder wall thickening which could reflect chronic infection, chronic outlet obstruction, or prior radiation therapy. Stomach/Bowel: Normal appendix. LEFT lower quadrant colostomy with parastomal herniation of nonobstructed small bowel loops. Scattered stool throughout colon. Post perineal proctectomy per chart. Large gas and fluid collection and presacral space, with thickened wall overall measuring 6.2 x 5.9 x 9.2 cm. Collection appears larger and the presence of air within the collection is new since the previous exam. Vascular/Lymphatic: Atherosclerotic calcifications aorta without aneurysm. Reproductive: N/A Other: No free air or free fluid. Musculoskeletal: Infiltrative changes and enlargement of the LEFT gluteus maximus, containing probable fluid and multiple foci of gas consistent with infection, potentially by gas-forming organism versus extension of the presacral space process. No acute osseous findings. IMPRESSION: Increase in  size of a presacral collection now 6.2 x 5.9 x 9.2 cm, newly containing gas, question abscess. New enlargement and gas within the LEFT gluteus maximus likely representing infection, question by a gas-forming organism. Interval resolution of BILATERAL hydronephrosis post nephrostomy placement. 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 Pulmonary metastases, with a LEFT lower lobe lesion appearing slightly smaller and a RIGHT lower lead lesion now cavitated. Bladder wall thickening question due to chronic infection, chronic outlet obstruction, or prior pelvic irradiation. Chronic calcific pancreatitis. 11 mm hypervascular focus lateral segment LEFT lobe liver cannot exclude hypervascular metastasis. Findings called to Dr. Roderic Palau on 03/04/2016 at 1800 hours. Electronically Signed   By: Lavonia Dana M.D.   On: 03/04/2016 18:01   Dg Chest Port 1 View  Result Date: 03/04/2016 CLINICAL DATA:  Several days of fever, tachycardia, back pain, and shortness of breath. Patient recently hospitalized for severe kidney infection. EXAM: PORTABLE CHEST 1 VIEW COMPARISON:  Portable chest x-ray of January 25, 2016 FINDINGS: The lungs are adequately inflated. The interstitial markings are prominent especially on the right. The heart is mildly enlarged. The central pulmonary vascularity is prominent. The mediastinum is normal in width. There is no pleural effusion. The power port catheter tip projects over the midportion of the SVC. The bony thorax is unremarkable. IMPRESSION: Mild interstitial prominence greatest on the left not as conspicuous as on the previous study. This likely reflects low-grade pulmonary edema. There is no alveolar pneumonia nor pleural effusion. When the patient can tolerate the procedure, a PA and lateral chest x-ray would be useful. Electronically Signed   By: David  Martinique M.D.   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 He is not known to be  MRSA colonized, thus would d/c vancomycin. Follow up on blood cx  Would recommend to have surgery weigh in on the next steps to evaluate for fistula

## 2016-03-05 NOTE — Consult Note (Signed)
[facility] Nurse ostomy consult for leaking pouch, no supplies Patient is known to our team; was last seen on 02/13/16 by K. Sanders while an inpatient at [facility].  Patient is off the unit in CT/Interventional Radiology both at the time of my initial attempt to visit and at the time of my second attempt to visit today.  4 sets of ostomy supplies (pouch, skin barrier and skin barrier ring) are provided to patient's room as well as one (1) bottle of ostomy powder for the treatment of minor skin irritations. Stoma type/location: LLQ Colostomy  Peristomal assessment: Not seen today Treatment options for stomal/peristomal skin: I have provided Nursing with guidance via the Orders for pouching procedure and provided supplies to the room sufficient for 7-10 days. [facility] nursing team will not follow, but will remain available to this patient, the nursing and medical teams.  Please re-consult if needed. Thanks, Maudie Flakes, MSN, RN, [place], Arther Abbott  Pager# [phone]

## 2016-03-05 NOTE — Progress Notes (Signed)
PROGRESS NOTE    Andrew Kline  GEX:[id number] DOB: August 16, 1965 DOA: 03/04/2016 PCP: [name], MD    Brief Narrative:  Patient is a 50 year old male with history of metastatic rectal cancer status post colostomy, renal cell carcinoma of the right kidney status post partial nephrectomy with bilateral nephrostomy tubes and prior presacral abscesses requiring admission 925-10 1 present with recurrent infection. Patient was seen at [facility] by general surgery who felt no surgical intervention was needed at this time and that patient will require percutaneous drainage via interventional radiology.   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 Sepsis protocol was obtained. Chest x-ray done was negative for any acute infiltrate. Patient has been pancultured. CT abdomen and pelvis which was abnormal did show a increasing size of presacral collection now at 6.2 x 5.9 x 9.2 cm newly containing gas. Admitting physician discussed CT findings with Dr. Arnoldo Morale of general surgery at [facility] who had recommended transfer to [facility] for percutaneous drainage of presacral abscess by interventional radiology. Patient currently on IV clindamycin. Patient is currently afebrile. WBC trending down. Per admitting physician if patient needs eventual iron DL gluteal abscess patient could be transferred back to [facility] and Dr. Arnoldo Morale will be happy to see him however per admitting physician Dr. Arnoldo Morale of general surgery does not believe any surgical  intervention is needed at this time. Continue IV fluids, pain management, empiric Antibiotics. Will consult with ID for further evaluation and antibiotic recommendation and duration.  #2 hyponatremia Likely secondary to hypovolemic hyponatremia. Sodium levels improving with hydration. Follow.  #3 history of rectal cancer/renal cell carcinoma status post bilateral nephrostomy tubes. Patient with history of metastatic rectal cancer status post colostomy, renal cell carcinoma of the right kidney status post partial nephrectomy with bilateral nephrostomy tubes. CT abdomen and pelvis with pulmonary metastases and possible liver metastases. Outpatient follow-up with his oncologist.  #4 depression Continue Cymbalta.  #5 probable complicated UTI Urine cultures pending. Patient on IV clindamycin.   DVT prophylaxis: Lovenox Code Status: Full Family Communication: Updated patient. No family at bedside. Disposition Plan: Home when medically stable and sepsis has resolved.   Consultants:   Interventional radiology Dr. Annamaria Boots 03/05/2016  Infectious diseases Dr. Baxter Flattery pending  Procedures:   CT abdomen and pelvis 03/04/2016  Chest x-ray 03/04/2016    Antimicrobials:   IV Zosyn 03/04/2016 1 dose.  IV vancomycin 03/04/2016 1 dose  IV clindamycin 03/05/2016   Subjective: Patient complaining of lower abdominal and buttocks pain. Patient states a lot of pain in the left buttocks causing pain in the left lower extremity. Patient states he's thirsty and also asking for food.  Objective: Vitals:   03/05/16 0500 03/05/16 0600 03/05/16 0654 03/05/16 0803  BP: (!) 92/59 (!) 97/58  (!) 109/55  Pulse: 95 91 96   Resp: (!) 25 (!) 26 (!) 30   Temp:    100.3 F (37.9 C)  TempSrc:    Oral  SpO2: 97% 96% 97%   Weight:      Height:        Intake/Output Summary (Last  24 hours) at 03/05/16 0959 Last data filed at 03/05/16 0659  Gross per 24 hour  Intake          3783.33 ml  Output              1450 ml  Net          2333.33 ml   Filed Weights   03/04/16 1544 03/04/16 2228  Weight: 81.6 kg (180 lb) 98.9 kg (218 lb)    Examination:  General exam: Appears calm and comfortable  Respiratory system: Clear to auscultation Anterior lung fields. Respiratory effort normal. Cardiovascular system: S1 & S2 heard, RRR. No JVD, murmurs, rubs, gallops or clicks. No pedal edema. Gastrointestinal system: Abdomen is obese, nondistended, soft and tender to palpation in the lower abdominal region. Colostomy bag with stool. Bilateral percutaneous nephrostomy tubes noted. No organomegaly or masses felt. Normal bowel sounds heard. Central nervous system: Alert and oriented. No focal neurological deficits. Extremities: Symmetric 5 x 5 power. Skin: No rashes, lesions or ulcers Psychiatry: Judgement and insight appear normal. Mood & affect appropriate.     Data Reviewed: I have personally reviewed following labs and imaging studies  CBC:  Recent Labs Lab 03/04/16 1610 03/04/16 1616 03/05/16 0122  WBC  --  18.0* 13.3*  NEUTROABS  --  15.3*  --   HGB 14.6 13.5 10.8*  HCT 43.0 39.8 32.5*  MCV  --  83.8 83.8  PLT  --  288 440   Basic Metabolic Panel:  Recent Labs Lab 03/04/16 1610 03/04/16 1616 03/05/16 0122  NA 128* 124* 133*  K 3.6 3.4* 3.6  CL 90* 91* 104  CO2  --  21* 22  GLUCOSE 168* 166* 145*  BUN '7 8 8  ' CREATININE 0.90 0.98 0.91  CALCIUM  --  8.1* 7.1*   GFR: Estimated Creatinine Clearance: 102.7 mL/min (by C-G formula based on SCr of 0.91 mg/dL). Liver Function Tests:  Recent Labs Lab 03/04/16 1616  AST 40  ALT 33  ALKPHOS 154*  BILITOT 1.3*  PROT 7.2  ALBUMIN 3.1*   No results for input(s): LIPASE, AMYLASE in the last 168 hours. No results for input(s): AMMONIA in the last 168 hours. Coagulation Profile:  Recent Labs Lab 03/04/16 2313  INR 1.33   Cardiac Enzymes: No results for input(s): CKTOTAL, CKMB, CKMBINDEX, TROPONINI in the last 168  hours. BNP (last 3 results) No results for input(s): PROBNP in the last 8760 hours. HbA1C: No results for input(s): HGBA1C in the last 72 hours. CBG: No results for input(s): GLUCAP in the last 168 hours. Lipid Profile: No results for input(s): CHOL, HDL, LDLCALC, TRIG, CHOLHDL, LDLDIRECT in the last 72 hours. Thyroid Function Tests: No results for input(s): TSH, T4TOTAL, FREET4, T3FREE, THYROIDAB in the last 72 hours. Anemia Panel: No results for input(s): VITAMINB12, FOLATE, FERRITIN, TIBC, IRON, RETICCTPCT in the last 72 hours. Sepsis Labs:  Recent Labs Lab 03/04/16 1614 03/04/16 1930 03/04/16 2313 03/05/16 0107  PROCALCITON  --   --  5.85  --   LATICACIDVEN 3.40* 1.11 1.9 1.2    Recent Results (from the past 240 hour(s))  Culture, blood (Routine x 2)     Status: None (Preliminary result)   Collection Time: 03/04/16  4:16 PM  Result Value Ref Range Status   Specimen Description BLOOD RIGHT HAND  Final   Special Requests BOTTLES DRAWN AEROBIC AND ANAEROBIC 8CC EACH  Final   Culture NO GROWTH < 24 HOURS  Final   Report Status  PENDING  Incomplete  Culture, blood (Routine x 2)     Status: None (Preliminary result)   Collection Time: 03/04/16  4:17 PM  Result Value Ref Range Status   Specimen Description LEFT ANTECUBITAL  Final   Special Requests BOTTLES DRAWN AEROBIC AND ANAEROBIC 6CC EACH  Final   Culture NO GROWTH < 24 HOURS  Final   Report Status PENDING  Incomplete  MRSA PCR Screening     Status: None   Collection Time: 03/04/16 10:37 PM  Result Value Ref Range Status   MRSA by PCR NEGATIVE NEGATIVE Final    Comment:        The GeneXpert MRSA Assay (FDA approved for NASAL specimens only), is one component of a comprehensive MRSA colonization surveillance program. It is not intended to diagnose MRSA infection nor to guide or monitor treatment for MRSA infections.          Radiology Studies: Ct Abdomen Pelvis W Contrast  Result Date:  03/04/2016 CLINICAL DATA:  Fever, low back pain, LEFT foot pain, shortness of breath, tachycardia, generalized abdominal pain, history renal cell carcinoma, rectal cancer, kidney stones, former smoker EXAM: CT ABDOMEN AND PELVIS WITH CONTRAST TECHNIQUE: Multidetector CT imaging of the abdomen and pelvis was performed using the standard protocol following bolus administration of intravenous contrast. Sagittal and coronal MPR images reconstructed from axial data set. CONTRAST:  119m ISOVUE-300 IOPAMIDOL (ISOVUE-300) INJECTION 61% COMPARISON:  01/26/2016, 01/12/2016 FINDINGS: Lower chest: Bibasilar pulmonary metastases again identified. 2.1 x 1.7 cm diameter RIGHT lower lobe metastasis image 7 has cavitated centrally since previous exam. LEFT lung base metastasis 17 x 13 mm image 15 appeared larger on the previous exam at approximately 17 x 18 mm. Other nodules appear grossly stable Hepatobiliary: Mild diffuse fatty infiltration of liver. Hypervascular focus lateral segment LEFT lobe liver 11 mm diameter image 19 Pancreas: Chronic calcific pancreatitis changes. No definite pancreatic mass or fluid collection. Spleen: Normal appearance Adrenals/Urinary Tract: Adrenal glands normal appearance. BILATERAL nephrostomy tubes without renal mass or hydronephrosis. Normal caliber ureters and bladder. Suprapubic catheter in bladder. Mild diffuse bladder wall thickening which could reflect chronic infection, chronic outlet obstruction, or prior radiation therapy. Stomach/Bowel: Normal appendix. LEFT lower quadrant colostomy with parastomal herniation of nonobstructed small bowel loops. Scattered stool throughout colon. Post perineal proctectomy per chart. Large gas and fluid collection and presacral space, with thickened wall overall measuring 6.2 x 5.9 x 9.2 cm. Collection appears larger and the presence of air within the collection is new since the previous exam. Vascular/Lymphatic: Atherosclerotic calcifications aorta without  aneurysm. Reproductive: N/A Other: No free air or free fluid. Musculoskeletal: Infiltrative changes and enlargement of the LEFT gluteus maximus, containing probable fluid and multiple foci of gas consistent with infection, potentially by gas-forming organism versus extension of the presacral space process. No acute osseous findings. IMPRESSION: Increase in size of a presacral collection now 6.2 x 5.9 x 9.2 cm, newly containing gas, question abscess. New enlargement and gas within the LEFT gluteus maximus likely representing infection, question by a gas-forming organism. Interval resolution of BILATERAL hydronephrosis post nephrostomy placement. 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 Pulmonary metastases, with a LEFT lower lobe lesion appearing slightly smaller and a RIGHT lower lead lesion now cavitated. Bladder wall thickening question due to chronic infection, chronic outlet obstruction, or prior pelvic irradiation. Chronic calcific pancreatitis. 11 mm hypervascular focus lateral segment LEFT lobe liver cannot exclude hypervascular metastasis. Findings called to Dr. ZRoderic Palau  on 03/04/2016 at 1800 hours. Electronically Signed   By: Lavonia Dana M.D.   On: 03/04/2016 18:01   Dg Chest Port 1 View  Result Date: 03/04/2016 CLINICAL DATA:  Several days of fever, tachycardia, back pain, and shortness of breath. Patient recently hospitalized for severe kidney infection. EXAM: PORTABLE CHEST 1 VIEW COMPARISON:  Portable chest x-ray of January 25, 2016 FINDINGS: The lungs are adequately inflated. The interstitial markings are prominent especially on the right. The heart is mildly enlarged. The central pulmonary vascularity is prominent. The mediastinum is normal in width. There is no pleural effusion. The power port catheter tip projects over the midportion of the SVC. The bony thorax is unremarkable. IMPRESSION: Mild interstitial prominence  greatest on the left not as conspicuous as on the previous study. This likely reflects low-grade pulmonary edema. There is no alveolar pneumonia nor pleural effusion. When the patient can tolerate the procedure, a PA and lateral chest x-ray would be useful. Electronically Signed   By: David  Martinique M.D.   On: 03/04/2016 16:40        Scheduled Meds: . amitriptyline  50 mg Oral QHS  . clindamycin (CLEOCIN) IV  600 mg Intravenous Q8H  . DULoxetine  60 mg Oral Daily  . enoxaparin (LOVENOX) injection  40 mg Subcutaneous Q24H  . [START ON 03/06/2016] fentaNYL  150 mcg Transdermal Q72H  . pregabalin  150 mg Oral BID  . sodium chloride flush  3 mL Intravenous Q12H   Continuous Infusions: . lactated ringers 100 mL/hr at 03/04/16 2303     LOS: 1 day    Time spent: 3 minutes    Jacksyn Beeks, MD [facility] Pager [phone]  If 7PM-7AM, please contact night-coverage [email or web address] Password [email or web address] 03/05/2016, 9:59 AM

## 2016-03-06 DIAGNOSIS — R509 Fever, unspecified: Secondary | ICD-10-CM

## 2016-03-06 LAB — COMPREHENSIVE METABOLIC PANEL
ALT: 22 U/L (ref 17–63)
AST: 22 U/L (ref 15–41)
Albumin: 1.9 g/dL — ABNORMAL LOW (ref 3.5–5.0)
Alkaline Phosphatase: 111 U/L (ref 38–126)
Anion gap: 8 (ref 5–15)
BILIRUBIN TOTAL: 0.5 mg/dL (ref 0.3–1.2)
BUN: 10 mg/dL (ref 6–20)
CO2: 26 mmol/L (ref 22–32)
Calcium: 7.8 mg/dL — ABNORMAL LOW (ref 8.9–10.3)
Chloride: 100 mmol/L — ABNORMAL LOW (ref 101–111)
Creatinine, Ser: 0.86 mg/dL (ref 0.61–1.24)
Glucose, Bld: 141 mg/dL — ABNORMAL HIGH (ref 65–99)
POTASSIUM: 3.6 mmol/L (ref 3.5–5.1)
Sodium: 134 mmol/L — ABNORMAL LOW (ref 135–145)
TOTAL PROTEIN: 5.4 g/dL — AB (ref 6.5–8.1)

## 2016-03-06 LAB — MAGNESIUM: MAGNESIUM: 1.8 mg/dL (ref 1.7–2.4)

## 2016-03-06 LAB — CBC WITH DIFFERENTIAL/PLATELET
BASOS ABS: 0 10*3/uL (ref 0.0–0.1)
BASOS PCT: 0 %
Eosinophils Absolute: 0.2 10*3/uL (ref 0.0–0.7)
Eosinophils Relative: 2 %
HEMATOCRIT: 31.8 % — AB (ref 39.0–52.0)
HEMOGLOBIN: 10.4 g/dL — AB (ref 13.0–17.0)
LYMPHS PCT: 5 %
Lymphs Abs: 0.5 10*3/uL — ABNORMAL LOW (ref 0.7–4.0)
MCH: 27.7 pg (ref 26.0–34.0)
MCHC: 32.7 g/dL (ref 30.0–36.0)
MCV: 84.8 fL (ref 78.0–100.0)
MONOS PCT: 7 %
Monocytes Absolute: 0.7 10*3/uL (ref 0.1–1.0)
NEUTROS ABS: 8.4 10*3/uL — AB (ref 1.7–7.7)
NEUTROS PCT: 86 %
Platelets: 293 10*3/uL (ref 150–400)
RBC: 3.75 MIL/uL — ABNORMAL LOW (ref 4.22–5.81)
RDW: 19.5 % — ABNORMAL HIGH (ref 11.5–15.5)
WBC: 9.7 10*3/uL (ref 4.0–10.5)

## 2016-03-06 LAB — URINE CULTURE: Culture: 70000 — AB

## 2016-03-06 MED ORDER — SENNOSIDES-DOCUSATE SODIUM 8.6-50 MG PO TABS
1.0000 | ORAL_TABLET | Freq: Every day | ORAL | Status: DC
  Administered 2016-03-06 – 2016-03-07 (×2): 1 via ORAL
  Filled 2016-03-06 (×2): qty 1

## 2016-03-06 MED ORDER — PIPERACILLIN-TAZOBACTAM 3.375 G IVPB
3.3750 g | Freq: Three times a day (TID) | INTRAVENOUS | Status: DC
  Administered 2016-03-06 – 2016-03-15 (×28): 3.375 g via INTRAVENOUS
  Filled 2016-03-06 (×31): qty 50

## 2016-03-06 NOTE — Progress Notes (Signed)
Pharmacy Antibiotic Note  Andrew Kline is a 50 y.o. male admitted on 03/04/2016 with wound infection.  Pharmacy has been consulted for Zosyn dosing. CT imaging suggested fluid and gas collection in presacral abscess. IR placed drain which yielded feculent material suspicious for vesiculo-colo fistula. ID consulted and recommended Zosyn to cover GI pathogens. WBC wnl, afebrile, CrCl ~108 ml/min.  Plan: -Zosyn 3.375 g IV EI q8hr -Follow-up ID recs, cultures, renal function -Narrow as appropriate  Height: '5\' 5"'$  (165.1 cm) Weight: 218 lb (98.9 kg) (Weight taken by RN) IBW/kg (Calculated) : 61.5  Temp (24hrs), Avg:98.8 F (37.1 C), Min:98.2 F (36.8 C), Max:100.3 F (37.9 C)   Recent Labs Lab 03/04/16 1610 03/04/16 1614 03/04/16 1616 03/04/16 1930 03/04/16 2313 03/05/16 0107 03/05/16 0122 03/06/16 0121  WBC  --   --  18.0*  --   --   --  13.3* 9.7  CREATININE 0.90  --  0.98  --   --   --  0.91 0.86  LATICACIDVEN  --  3.40*  --  1.11 1.9 1.2  --   --     Estimated Creatinine Clearance: 108.7 mL/min (by C-G formula based on SCr of 0.86 mg/dL).    Allergies  Allergen Reactions  . Oxaliplatin Itching    Antimicrobials this admission: 11/3 Vanco x1 11/3 Zosyn x1, resumed 11/5  Dose adjustments this admission: none  Microbiology results: 11/3 BCx: IP 11/3 UCx: IP  11/3 MRSA PCR: neg 11/4 Abscess Cx: moderate GPC in pairs, abundant GN coccobacilli  Thank you for allowing pharmacy to be a part of this patient's care.  Arrie Senate, PharmD PGY-1 Pharmacy Resident Pager: [phone] 03/06/2016

## 2016-03-06 NOTE — Progress Notes (Signed)
PROGRESS NOTE    Andrew Kline  ACZ:[id number] DOB: [birth year] DOA: 03/04/2016 PCP: [name], MD    Brief Narrative:  Patient is a 50 year old male with history of metastatic rectal cancer status post colostomy, renal cell carcinoma of the right kidney status post partial nephrectomy with bilateral nephrostomy tubes and prior presacral abscesses requiring admission 925-10 1 present with recurrent infection. Patient was seen at [facility] by general surgery who felt no surgical intervention was needed at this time and that patient will require percutaneous drainage via interventional radiology. Patient underwent CT-guided left transgluteal drain placement with fecal fluid aspirated and culture sent of concern for fistula to bladder or bowel. General surgery consulted. ID is also been consulted and patient currently on IV Zosyn.   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 Sepsis protocol was obtained. Chest x-ray done was negative for any acute infiltrate. Patient has been pancultured. CT abdomen and pelvis which was abnormal did show a increasing size of presacral collection now at 6.2 x 5.9 x 9.2 cm newly containing gas. Admitting physician discussed CT findings with Dr. Arnoldo Morale of general surgery at [facility] who had recommended transfer to [facility] for percutaneous drainage of presacral abscess by interventional radiology. Patient was on IV clindamycin however has been changed to IV Zosyn per ID recommendations.  Patient is currently afebrile. WBC trending down. Blood cultures pending. Per admitting physician if patient needs eventual I and D on gluteal abscess patient could be transferred back to [facility] and Dr. Arnoldo Morale will be happy to see him however per admitting physician. Dr. Arnoldo Morale of general surgery does not believe any surgical intervention is needed at this time.  Patient status post CT-guided transgluteal pelvic drain placement interventional radiology 03/05/2016 which had 50 mL of exudative, fecal fluid aspirated. Cultures from abscess with moderate gram-positive cocci. His, abundant gram-negative Coco bacilli. Concern for fistula to bladder and bowel. Interventional radiology. Consult with general surgery for suspected fistula formation. Decrease IV fluids. Continue pain management, IV Zosyn. ID following and appreciate input and recommendations.  #2 hyponatremia Likely secondary to hypovolemic hyponatremia. Sodium levels improving with hydration. Decrease IV fluids to 75 mL per hour. Follow.  #3 history of rectal cancer/renal cell carcinoma status post bilateral nephrostomy tubes. Patient with history of metastatic rectal cancer status post colostomy, renal cell carcinoma of the right kidney status post partial nephrectomy with bilateral nephrostomy tubes. CT abdomen and pelvis with pulmonary metastases and possible liver metastases. Outpatient follow-up with his oncologist.  #4 depression Continue Cymbalta.  #5 probable complicated UTI Urine cultures pending. Patient on IV Zosyn.   DVT prophylaxis: Lovenox Code Status: Full Family Communication: Updated patient. No family at bedside. Disposition Plan: Home when medically stable and sepsis has resolved.   Consultants:   Interventional radiology Dr. Annamaria Boots 03/05/2016  Infectious diseases Dr. Baxter Flattery 03/05/2016  General surgery pending  Wound care  Procedures:   CT abdomen and pelvis 03/04/2016  Chest x-ray  03/04/2016  CT-guided left transgluteal pelvic drain placement with 50 mL of exudative, fecal fluid aspirated per Dr. Annamaria Boots  03/05/2016  Antimicrobials:   IV Zosyn 03/04/2016 1 dose.  IV vancomycin 03/04/2016 1 dose  IV clindamycin 03/05/2016>>> 03/05/2016  IV Zosyn 03/06/2016     Subjective: Patient states improvement witH lower abdominal and buttocks pain. Patient tolerating current diet. Patient asking to ambulate.  Objective: Vitals:   03/05/16 1723 03/05/16 2000 03/05/16 2300 03/06/16 0400  BP: 105/65 115/66 102/66 97/61  Pulse: (!) 106 93 91 88  Resp: (!) 29 (!) 23 (!) 22 (!) 21  Temp: 98.4 F (36.9 C) 99.2 F (37.3 C) 99.7 F (37.6 C) 98.6 F (37 C)  TempSrc: Oral Oral Oral Oral  SpO2: 97% 95% 96% 94%  Weight:      Height:        Intake/Output Summary (Last 24 hours) at 03/06/16 0921 Last data filed at 03/06/16 0616  Gross per 24 hour  Intake          3545.42 ml  Output             1300 ml  Net          2245.42 ml   Filed Weights   03/04/16 1544 03/04/16 2228  Weight: 81.6 kg (180 lb) 98.9 kg (218 lb)    Examination:  General exam: Appears calm and comfortable  Respiratory system: Some bibasilar crackles noted. No wheezing Respiratory effort normal. Cardiovascular system: S1 & S2 heard, RRR. No JVD, murmurs, rubs, gallops or clicks. No pedal edema. Gastrointestinal system: Abdomen is obese, nondistended, soft and tender to palpation in the lower abdominal region. Colostomy bag with stool. Bilateral percutaneous nephrostomy tubes noted. Left gluteal drain with serosanguineous fluid. No organomegaly or masses felt. Normal bowel sounds heard. Central nervous system: Alert and oriented. No focal neurological deficits. Extremities: Symmetric 5 x 5 power. Skin: No rashes, lesions or ulcers Psychiatry: Judgement and insight appear normal. Mood & affect appropriate.     Data Reviewed: I have personally reviewed following labs and imaging  studies  CBC:  Recent Labs Lab 03/04/16 1610 03/04/16 1616 03/05/16 0122 03/06/16 0121  WBC  --  18.0* 13.3* 9.7  NEUTROABS  --  15.3*  --  8.4*  HGB 14.6 13.5 10.8* 10.4*  HCT 43.0 39.8 32.5* 31.8*  MCV  --  83.8 83.8 84.8  PLT  --  288 230 749   Basic Metabolic Panel:  Recent Labs Lab 03/04/16 1610 03/04/16 1616 03/05/16 0122 03/06/16 0121  NA 128* 124* 133* 134*  K 3.6 3.4* 3.6 3.6  CL 90* 91* 104 100*  CO2  --  21* 22 26  GLUCOSE 168* 166* 145* 141*  BUN '7 8 8 10  ' CREATININE 0.90 0.98 0.91 0.86  CALCIUM  --  8.1* 7.1* 7.8*  MG  --   --   --  1.8   GFR: Estimated Creatinine Clearance: 108.7 mL/min (by C-G formula based on SCr of 0.86 mg/dL). Liver Function Tests:  Recent Labs Lab 03/04/16 1616 03/06/16 0121  AST 40 22  ALT 33 22  ALKPHOS 154* 111  BILITOT 1.3* 0.5  PROT 7.2 5.4*  ALBUMIN 3.1* 1.9*   No results for input(s): LIPASE, AMYLASE in the last 168 hours. No results for input(s): AMMONIA in the last 168 hours. Coagulation Profile:  Recent Labs Lab 03/04/16 2313  INR 1.33   Cardiac Enzymes: No results for input(s): CKTOTAL, CKMB, CKMBINDEX, TROPONINI in the last 168 hours. BNP (last 3 results) No results for input(s): PROBNP in the last 8760 hours. HbA1C: No results for input(s):  HGBA1C in the last 72 hours. CBG: No results for input(s): GLUCAP in the last 168 hours. Lipid Profile: No results for input(s): CHOL, HDL, LDLCALC, TRIG, CHOLHDL, LDLDIRECT in the last 72 hours. Thyroid Function Tests: No results for input(s): TSH, T4TOTAL, FREET4, T3FREE, THYROIDAB in the last 72 hours. Anemia Panel: No results for input(s): VITAMINB12, FOLATE, FERRITIN, TIBC, IRON, RETICCTPCT in the last 72 hours. Sepsis Labs:  Recent Labs Lab 03/04/16 1614 03/04/16 1930 03/04/16 2313 03/05/16 0107  PROCALCITON  --   --  5.85  --   LATICACIDVEN 3.40* 1.11 1.9 1.2    Recent Results (from the past 240 hour(s))  Culture, blood (Routine x 2)      Status: None (Preliminary result)   Collection Time: 03/04/16  4:16 PM  Result Value Ref Range Status   Specimen Description BLOOD RIGHT HAND  Final   Special Requests BOTTLES DRAWN AEROBIC AND ANAEROBIC 8CC EACH  Final   Culture NO GROWTH 2 DAYS  Final   Report Status PENDING  Incomplete  Culture, blood (Routine x 2)     Status: None (Preliminary result)   Collection Time: 03/04/16  4:17 PM  Result Value Ref Range Status   Specimen Description LEFT ANTECUBITAL  Final   Special Requests BOTTLES DRAWN AEROBIC AND ANAEROBIC 6CC EACH  Final   Culture NO GROWTH 2 DAYS  Final   Report Status PENDING  Incomplete  MRSA PCR Screening     Status: None   Collection Time: 03/04/16 10:37 PM  Result Value Ref Range Status   MRSA by PCR NEGATIVE NEGATIVE Final    Comment:        The GeneXpert MRSA Assay (FDA approved for NASAL specimens only), is one component of a comprehensive MRSA colonization surveillance program. It is not intended to diagnose MRSA infection nor to guide or monitor treatment for MRSA infections.   Culture, blood (x 2)     Status: None (Preliminary result)   Collection Time: 03/04/16 11:13 PM  Result Value Ref Range Status   Specimen Description BLOOD RIGHT ARM  Final   Special Requests BOTTLES DRAWN AEROBIC AND ANAEROBIC 10ML  Final   Culture NO GROWTH 1 DAY  Final   Report Status PENDING  Incomplete  Culture, blood (x 2)     Status: None (Preliminary result)   Collection Time: 03/04/16 11:20 PM  Result Value Ref Range Status   Specimen Description BLOOD RIGHT HAND  Final   Special Requests AEROBIC BOTTLE ONLY 6ML  Final   Culture NO GROWTH 1 DAY  Final   Report Status PENDING  Incomplete  Aerobic/Anaerobic Culture (surgical/deep wound)     Status: None (Preliminary result)   Collection Time: 03/05/16 12:57 PM  Result Value Ref Range Status   Specimen Description ABSCESS ABDOMEN  Final   Special Requests Normal  Final   Gram Stain   Final    FEW WBC PRESENT,  PREDOMINANTLY PMN ABUNDANT GRAM NEGATIVE COCCOBACILLI MODERATE GRAM POSITIVE COCCI IN PAIRS    Culture PENDING  Incomplete   Report Status PENDING  Incomplete         Radiology Studies: Ct Abdomen Pelvis W Contrast  Result Date: 03/04/2016 CLINICAL DATA:  Fever, low back pain, LEFT foot pain, shortness of breath, tachycardia, generalized abdominal pain, history renal cell carcinoma, rectal cancer, kidney stones, former smoker EXAM: CT ABDOMEN AND PELVIS WITH CONTRAST TECHNIQUE: Multidetector CT imaging of the abdomen and pelvis was performed using the standard protocol following bolus administration of intravenous  contrast. Sagittal and coronal MPR images reconstructed from axial data set. CONTRAST:  171m ISOVUE-300 IOPAMIDOL (ISOVUE-300) INJECTION 61% COMPARISON:  01/26/2016, 01/12/2016 FINDINGS: Lower chest: Bibasilar pulmonary metastases again identified. 2.1 x 1.7 cm diameter RIGHT lower lobe metastasis image 7 has cavitated centrally since previous exam. LEFT lung base metastasis 17 x 13 mm image 15 appeared larger on the previous exam at approximately 17 x 18 mm. Other nodules appear grossly stable Hepatobiliary: Mild diffuse fatty infiltration of liver. Hypervascular focus lateral segment LEFT lobe liver 11 mm diameter image 19 Pancreas: Chronic calcific pancreatitis changes. No definite pancreatic mass or fluid collection. Spleen: Normal appearance Adrenals/Urinary Tract: Adrenal glands normal appearance. BILATERAL nephrostomy tubes without renal mass or hydronephrosis. Normal caliber ureters and bladder. Suprapubic catheter in bladder. Mild diffuse bladder wall thickening which could reflect chronic infection, chronic outlet obstruction, or prior radiation therapy. Stomach/Bowel: Normal appendix. LEFT lower quadrant colostomy with parastomal herniation of nonobstructed small bowel loops. Scattered stool throughout colon. Post perineal proctectomy per chart. Large gas and fluid collection and  presacral space, with thickened wall overall measuring 6.2 x 5.9 x 9.2 cm. Collection appears larger and the presence of air within the collection is new since the previous exam. Vascular/Lymphatic: Atherosclerotic calcifications aorta without aneurysm. Reproductive: N/A Other: No free air or free fluid. Musculoskeletal: Infiltrative changes and enlargement of the LEFT gluteus maximus, containing probable fluid and multiple foci of gas consistent with infection, potentially by gas-forming organism versus extension of the presacral space process. No acute osseous findings. IMPRESSION: Increase in size of a presacral collection now 6.2 x 5.9 x 9.2 cm, newly containing gas, question abscess. New enlargement and gas within the LEFT gluteus maximus likely representing infection, question by a gas-forming organism. Interval resolution of BILATERAL hydronephrosis post nephrostomy placement. 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 Pulmonary metastases, with a LEFT lower lobe lesion appearing slightly smaller and a RIGHT lower lead lesion now cavitated. Bladder wall thickening question due to chronic infection, chronic outlet obstruction, or prior pelvic irradiation. Chronic calcific pancreatitis. 11 mm hypervascular focus lateral segment LEFT lobe liver cannot exclude hypervascular metastasis. Findings called to Dr. ZRoderic Palauon 03/04/2016 at 1800 hours. Electronically Signed   By: MLavonia DanaM.D.   On: 03/04/2016 18:01   Dg Chest Port 1 View  Result Date: 03/04/2016 CLINICAL DATA:  Several days of fever, tachycardia, back pain, and shortness of breath. Patient recently hospitalized for severe kidney infection. EXAM: PORTABLE CHEST 1 VIEW COMPARISON:  Portable chest x-ray of January 25, 2016 FINDINGS: The lungs are adequately inflated. The interstitial markings are prominent especially on the right. The heart is mildly enlarged. The central  pulmonary vascularity is prominent. The mediastinum is normal in width. There is no pleural effusion. The power port catheter tip projects over the midportion of the SVC. The bony thorax is unremarkable. IMPRESSION: Mild interstitial prominence greatest on the left not as conspicuous as on the previous study. This likely reflects low-grade pulmonary edema. There is no alveolar pneumonia nor pleural effusion. When the patient can tolerate the procedure, a PA and lateral chest x-ray would be useful. Electronically Signed   By: David  JMartiniqueM.D.   On: 03/04/2016 16:40   Ct Image Guided Drainage By Percutaneous Catheter  Result Date: 03/05/2016 INDICATION: Fever, elevated white count, recurrent presacral pelvic abscess. 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 EXAM: CT GUIDED DRAINAGE OF RECURRENT PRESACRAL PELVIC ABSCESS MEDICATIONS: The patient is currently admitted  to the hospital and receiving intravenous antibiotics. The antibiotics were administered within an appropriate time frame prior to the initiation of the procedure. ANESTHESIA/SEDATION: 2.0 mg IV Versed 200 mcg IV Fentanyl Moderate Sedation Time:  14 MINUTES The patient was continuously monitored during the procedure by the interventional radiology nurse under my direct supervision. COMPLICATIONS: None immediate. TECHNIQUE: Informed written consent was obtained from the patient after a thorough discussion of the procedural risks, benefits and alternatives. All questions were addressed. Maximal Sterile Barrier Technique was utilized including caps, mask, sterile gowns, sterile gloves, sterile drape, hand hygiene and skin antiseptic. A timeout was performed prior to the initiation of the procedure. PROCEDURE: Previous imaging reviewed. Patient positioned prone. Noncontrast localization CT performed. The presacral pelvic abscess was localized. Abscess cavity now containing dilute contrast, suspect from fistula the bladder or bowel The left gluteal region was  prepped with ChloraPrep in a sterile fashion, and a sterile drape was applied covering the operative field. A sterile gown and sterile gloves were used for the procedure. Local anesthesia was provided with 1% Lidocaine. Under sterile conditions and local anesthesia, an 18 gauge 10 cm access needle was advanced from a left trans gluteal approach into the fluid collection. Needle position confirmed. Syringe aspiration yielded fecal contaminated fluid. Guidewire inserted followed by tract dilatation to advance a 10 [place] drain. Drain catheter position confirmed with CT. Syringe aspiration yielded 50 cc purulent fecal contaminated fluid. Catheter secured with a Prolene suture and connected to external gravity drainage bag. Sterile dressing applied. No immediate complication. Patient tolerated the procedure well. FINDINGS: CT imaging confirms needle access of the presacral abscess for drain insertion. IMPRESSION: Successful CT-guided left trans gluteal presacral abscess drain insertion. Dilute contrast within the abscess cavity from the recent CT, suspicious for fistula from either the bowel or bladder. Electronically Signed   By: Jerilynn Mages.  Shick M.D.   On: 03/05/2016 13:06        Scheduled Meds: . amitriptyline  50 mg Oral QHS  . DULoxetine  60 mg Oral Daily  . enoxaparin (LOVENOX) injection  40 mg Subcutaneous Q24H  . fentaNYL  150 mcg Transdermal Q72H  . piperacillin-tazobactam (ZOSYN)  IV  3.375 g Intravenous Q8H  . pregabalin  150 mg Oral BID  . senna-docusate  1 tablet Oral QHS  . sodium chloride flush  3 mL Intravenous Q12H   Continuous Infusions: . lactated ringers 125 mL/hr at 03/06/16 0732     LOS: 2 days    Time spent: 80 minutes    Mariellen Blaney, MD [facility] Pager [phone]  If 7PM-7AM, please contact night-coverage [email or web address] Password [email or web address] 03/06/2016, 9:21 AM

## 2016-03-06 NOTE — Consult Note (Signed)
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 male.  HPI:  Pt is a 50 yo M with metastatic rectal cancer who was admitted yesterday with increased pelvic pain, lethargy, and fever.  Andrew Kline is s/p diverting ostomy in October 2016 and s/p APR in April 2017 by Dr. Aviva Signs at [facility].  This was c/b urethral injury with repair.  Andrew Kline got an SP tube at that time as well.  In July, Andrew Kline was seen to have presacral fluid/gas collection and drain was placed.  Fluid was purulent and grew multiple organisms, but was called "normal skin flora" and was not speciated.  Drain was eventually pulled.    Andrew Kline got repeat scan for brown discharge from penis and incontinence.  There was concern for continued leak of urine into the collection, so at this point, Andrew Kline has bilateral perc nephrostomy tubes.  Ureteral obstruction was seen on the right.    Andrew Kline has pulmonary metastases that have had intermittent response to chemotherapy, but currently are enlarging. WBCs down since antibiotics and perc drain.  We are requested to consult due to contrast within collection and purulent "fecal contaminated" fluid.    Past Medical History:  Diagnosis Date  . Anxiety   . Arthritis   . Depression   . Kidney stone 03/22/15   left  . Rectal cancer ([place])   . Rectal pain   . Renal cell cancer ([place])    2012.   [name] Shortness of breath dyspnea     Past Surgical History:  Procedure Laterality Date  . BIOPSY N/A 09/30/2014   Procedure: BIOPSY;  Surgeon: Danie Binder, MD;  Location: [facility];  Service: Endoscopy;  Laterality: N/A;  Anal Canal  . COLOSTOMY N/A 02/11/2015   Procedure: COLOSTOMY;  Surgeon: Aviva Signs, MD;  Location: [facility];  Service: General;  Laterality: N/A;  . FLEXIBLE SIGMOIDOSCOPY N/A 09/30/2014   Procedure: FLEXIBLE SIGMOIDOSCOPY;  Surgeon: Danie Binder, MD;  Location: [facility];  Service: Endoscopy;  Laterality: N/A;  .  FLEXIBLE SIGMOIDOSCOPY N/A 10/17/2014   Procedure: FLEXIBLE SIGMOIDOSCOPY;  Surgeon: Danie Binder, MD;  Location: [facility];  Service: Endoscopy;  Laterality: N/A;  1325  . HYPOSPADIAS CORRECTION    . IR GENERIC HISTORICAL  12/17/2015   IR CATHETER TUBE CHANGE 12/17/2015 [facility]  . IR GENERIC HISTORICAL  01/27/2016   IR NEPHROSTOMY PLACEMENT RIGHT 01/27/2016 [facility]  . IR GENERIC HISTORICAL  01/27/2016   IR NEPHROSTOMY PLACEMENT LEFT 01/27/2016 [facility]  . KNEE SURGERY Left    arthroscopy  . LAPAROSCOPIC PARTIAL NEPHRECTOMY  2012   right side  . LUNG BIOPSY Right   . PERINEAL PROCTECTOMY N/A 08/26/2015   Procedure:  PROCTECTOMY;  Surgeon: Aviva Signs, MD;  Location: [facility];  Service: General;  Laterality: N/A;  . RECTAL SURGERY      Family History  Problem Relation Age of Onset  . Healthy Mother   . Lung cancer Maternal Grandfather   . Colon cancer Maternal Uncle     age 21  . Diabetes Other     Social History:  reports that Andrew Kline has been smoking Cigarettes.  Andrew Kline has a 15.00 pack-year smoking history. Andrew Kline has never used smokeless tobacco. Andrew Kline reports that Andrew Kline drinks alcohol. Andrew Kline reports that Andrew Kline does not use drugs.  Allergies:  Allergies  Allergen Reactions  . Oxaliplatin Itching    Medications:  Prior to  Admission:  Prescriptions Prior to Admission  Medication Sig Dispense Refill Last Dose  . albuterol (PROVENTIL HFA;VENTOLIN HFA) 108 (90 Base) MCG/ACT inhaler Inhale 2 puffs into the lungs every 6 (six) hours as needed for wheezing or shortness of breath. 1 Inhaler 2 Past Month at Unknown time  . amitriptyline (ELAVIL) 50 MG tablet Take 1 tablet (50 mg total) by mouth at bedtime. 30 tablet 3 03/03/2016 at Unknown time  . docusate sodium (COLACE) 100 MG capsule Take 1 capsule (100 mg total) by mouth every 12 (twelve) hours. (Patient taking differently: Take 100 mg by mouth daily as needed for mild constipation or moderate constipation. ) 60 capsule 0 03/03/2016 at  Unknown time  . DULoxetine (CYMBALTA) 60 MG capsule Take 1 capsule (60 mg total) by mouth daily. 30 capsule 3 03/03/2016 at Unknown time  . fentaNYL (DURAGESIC - DOSED MCG/HR) 75 MCG/HR Place 2 patches (150 mcg total) onto the skin every 3 (three) days. 20 patch 0 03/03/2016 at Unknown time  . HYDROmorphone (DILAUDID) 4 MG tablet Take 1 tablet (4 mg total) by mouth every 4 (four) hours as needed for severe pain. 60 tablet 0 03/03/2016 at Unknown time  . pregabalin (LYRICA) 75 MG capsule Take 2 capsules (150 mg total) by mouth 2 (two) times daily. 120 capsule 3 03/03/2016 at Unknown time  . zolpidem (AMBIEN CR) 12.5 MG CR tablet Take 1 tablet (12.5 mg total) by mouth at bedtime as needed for sleep. 30 tablet 0 03/03/2016 at Unknown time    Results for orders placed or performed during the hospital encounter of 03/04/16 (from the past 48 hour(s))  I-stat chem 8, ed     Status: Abnormal   Collection Time: 03/04/16  4:10 PM  Result Value Ref Range   Sodium 128 (L) 135 - 145 mmol/L   Potassium 3.6 3.5 - 5.1 mmol/L   Chloride 90 (L) 101 - 111 mmol/L   BUN 7 6 - 20 mg/dL   Creatinine, Ser 0.90 0.61 - 1.24 mg/dL   Glucose, Bld 168 (H) 65 - 99 mg/dL   Calcium, Ion 1.00 (L) 1.15 - 1.40 mmol/L   TCO2 23 0 - 100 mmol/L   Hemoglobin 14.6 13.0 - 17.0 g/dL   HCT 43.0 39.0 - 52.0 %  I-Stat CG4 Lactic Acid, ED     Status: Abnormal   Collection Time: 03/04/16  4:14 PM  Result Value Ref Range   Lactic Acid, Venous 3.40 (HH) 0.5 - 1.9 mmol/L   Comment NOTIFIED PHYSICIAN   Comprehensive metabolic panel     Status: Abnormal   Collection Time: 03/04/16  4:16 PM  Result Value Ref Range   Sodium 124 (L) 135 - 145 mmol/L   Potassium 3.4 (L) 3.5 - 5.1 mmol/L   Chloride 91 (L) 101 - 111 mmol/L   CO2 21 (L) 22 - 32 mmol/L   Glucose, Bld 166 (H) 65 - 99 mg/dL   BUN 8 6 - 20 mg/dL   Creatinine, Ser 0.98 0.61 - 1.24 mg/dL   Calcium 8.1 (L) 8.9 - 10.3 mg/dL   Total Protein 7.2 6.5 - 8.1 g/dL   Albumin 3.1 (L) 3.5 -  5.0 g/dL   AST 40 15 - 41 U/L   ALT 33 17 - 63 U/L   Alkaline Phosphatase 154 (H) 38 - 126 U/L   Total Bilirubin 1.3 (H) 0.3 - 1.2 mg/dL   GFR calc non Af Amer >60 >60 mL/min   GFR calc Af Amer >60 >60  mL/min    Comment: (NOTE) The eGFR has been calculated using the CKD EPI equation. This calculation has not been validated in all clinical situations. eGFR's persistently <60 mL/min signify possible Chronic Kidney Disease.    Anion gap 12 5 - 15  CBC with Differential     Status: Abnormal   Collection Time: 03/04/16  4:16 PM  Result Value Ref Range   WBC 18.0 (H) 4.0 - 10.5 K/uL   RBC 4.75 4.22 - 5.81 MIL/uL   Hemoglobin 13.5 13.0 - 17.0 g/dL   HCT 39.8 39.0 - 52.0 %   MCV 83.8 78.0 - 100.0 fL   MCH 28.4 26.0 - 34.0 pg   MCHC 33.9 30.0 - 36.0 g/dL   RDW 18.5 (H) 11.5 - 15.5 %   Platelets 288 150 - 400 K/uL   Neutrophils Relative % 85 %   Neutro Abs 15.3 (H) 1.7 - 7.7 K/uL   Lymphocytes Relative 4 %   Lymphs Abs 0.6 (L) 0.7 - 4.0 K/uL   Monocytes Relative 11 %   Monocytes Absolute 2.1 (H) 0.1 - 1.0 K/uL   Eosinophils Relative 0 %   Eosinophils Absolute 0.0 0.0 - 0.7 K/uL   Basophils Relative 0 %   Basophils Absolute 0.0 0.0 - 0.1 K/uL  Culture, blood (Routine x 2)     Status: None (Preliminary result)   Collection Time: 03/04/16  4:16 PM  Result Value Ref Range   Specimen Description BLOOD RIGHT HAND    Special Requests BOTTLES DRAWN AEROBIC AND ANAEROBIC 8CC EACH    Culture NO GROWTH 2 DAYS    Report Status PENDING   Culture, blood (Routine x 2)     Status: None (Preliminary result)   Collection Time: 03/04/16  4:17 PM  Result Value Ref Range   Specimen Description LEFT ANTECUBITAL    Special Requests BOTTLES DRAWN AEROBIC AND ANAEROBIC 6CC EACH    Culture NO GROWTH 2 DAYS    Report Status PENDING   Urine culture     Status: Abnormal   Collection Time: 03/04/16  4:37 PM  Result Value Ref Range   Specimen Description URINE, CLEAN CATCH    Special Requests NONE     Culture 70,000 COLONIES/mL YEAST (A)    Report Status 03/06/2016 FINAL   Urinalysis, Routine w reflex microscopic     Status: Abnormal   Collection Time: 03/04/16  4:37 PM  Result Value Ref Range   Color, Urine YELLOW YELLOW   APPearance CLEAR CLEAR   Specific Gravity, Urine 1.015 1.005 - 1.030   pH 6.0 5.0 - 8.0   Glucose, UA NEGATIVE NEGATIVE mg/dL   Hgb urine dipstick LARGE (A) NEGATIVE   Bilirubin Urine NEGATIVE NEGATIVE   Ketones, ur NEGATIVE NEGATIVE mg/dL   Protein, ur 100 (A) NEGATIVE mg/dL   Nitrite NEGATIVE NEGATIVE   Leukocytes, UA LARGE (A) NEGATIVE  Urine microscopic-add on     Status: Abnormal   Collection Time: 03/04/16  4:37 PM  Result Value Ref Range   Squamous Epithelial / LPF 0-5 (A) NONE SEEN   WBC, UA TOO NUMEROUS TO COUNT 0 - 5 WBC/hpf   RBC / HPF TOO NUMEROUS TO COUNT 0 - 5 RBC/hpf   Bacteria, UA MANY (A) NONE SEEN   Urine-Other YEAST PRESENT   I-Stat CG4 Lactic Acid, ED     Status: None   Collection Time: 03/04/16  7:30 PM  Result Value Ref Range   Lactic Acid, Venous 1.11 0.5 - 1.9 mmol/L  MRSA PCR Screening     Status: None   Collection Time: 03/04/16 10:37 PM  Result Value Ref Range   MRSA by PCR NEGATIVE NEGATIVE    Comment:        The GeneXpert MRSA Assay (FDA approved for NASAL specimens only), is one component of a comprehensive MRSA colonization surveillance program. It is not intended to diagnose MRSA infection nor to guide or monitor treatment for MRSA infections.   Culture, blood (x 2)     Status: None (Preliminary result)   Collection Time: 03/04/16 11:13 PM  Result Value Ref Range   Specimen Description BLOOD RIGHT ARM    Special Requests BOTTLES DRAWN AEROBIC AND ANAEROBIC 10ML    Culture NO GROWTH 1 DAY    Report Status PENDING   Lactic acid, plasma     Status: None   Collection Time: 03/04/16 11:13 PM  Result Value Ref Range   Lactic Acid, Venous 1.9 0.5 - 1.9 mmol/L  Procalcitonin     Status: None   Collection Time:  03/04/16 11:13 PM  Result Value Ref Range   Procalcitonin 5.85 ng/mL    Comment:        Interpretation: PCT > 2 ng/mL: Systemic infection (sepsis) is likely, unless other causes are known. (NOTE)         ICU PCT Algorithm               Non ICU PCT Algorithm    ----------------------------     ------------------------------         PCT < 0.25 ng/mL                 PCT < 0.1 ng/mL     Stopping of antibiotics            Stopping of antibiotics       strongly encouraged.               strongly encouraged.    ----------------------------     ------------------------------       PCT level decrease by               PCT < 0.25 ng/mL       >= 80% from peak PCT       OR PCT 0.25 - 0.5 ng/mL          Stopping of antibiotics                                             encouraged.     Stopping of antibiotics           encouraged.    ----------------------------     ------------------------------       PCT level decrease by              PCT >= 0.25 ng/mL       < 80% from peak PCT        AND PCT >= 0.5 ng/mL            Continuing antibiotics                                               encouraged.       Continuing antibiotics  encouraged.    ----------------------------     ------------------------------     PCT level increase compared          PCT > 0.5 ng/mL         with peak PCT AND          PCT >= 0.5 ng/mL             Escalation of antibiotics                                          strongly encouraged.      Escalation of antibiotics        strongly encouraged.   Protime-INR     Status: Abnormal   Collection Time: 03/04/16 11:13 PM  Result Value Ref Range   Prothrombin Time 16.6 (H) 11.4 - 15.2 seconds   INR 1.33   APTT     Status: Abnormal   Collection Time: 03/04/16 11:13 PM  Result Value Ref Range   aPTT 41 (H) 24 - 36 seconds    Comment:        IF BASELINE aPTT IS ELEVATED, SUGGEST PATIENT RISK ASSESSMENT BE USED TO DETERMINE APPROPRIATE ANTICOAGULANT  THERAPY.   Culture, blood (x 2)     Status: None (Preliminary result)   Collection Time: 03/04/16 11:20 PM  Result Value Ref Range   Specimen Description BLOOD RIGHT HAND    Special Requests AEROBIC BOTTLE ONLY 6ML    Culture NO GROWTH 1 DAY    Report Status PENDING   Lactic acid, plasma     Status: None   Collection Time: 03/05/16  1:07 AM  Result Value Ref Range   Lactic Acid, Venous 1.2 0.5 - 1.9 mmol/L  Basic metabolic panel     Status: Abnormal   Collection Time: 03/05/16  1:22 AM  Result Value Ref Range   Sodium 133 (L) 135 - 145 mmol/L   Potassium 3.6 3.5 - 5.1 mmol/L   Chloride 104 101 - 111 mmol/L   CO2 22 22 - 32 mmol/L   Glucose, Bld 145 (H) 65 - 99 mg/dL   BUN 8 6 - 20 mg/dL   Creatinine, Ser 0.91 0.61 - 1.24 mg/dL   Calcium 7.1 (L) 8.9 - 10.3 mg/dL   GFR calc non Af Amer >60 >60 mL/min   GFR calc Af Amer >60 >60 mL/min    Comment: (NOTE) The eGFR has been calculated using the CKD EPI equation. This calculation has not been validated in all clinical situations. eGFR's persistently <60 mL/min signify possible Chronic Kidney Disease.    Anion gap 7 5 - 15  CBC     Status: Abnormal   Collection Time: 03/05/16  1:22 AM  Result Value Ref Range   WBC 13.3 (H) 4.0 - 10.5 K/uL   RBC 3.88 (L) 4.22 - 5.81 MIL/uL   Hemoglobin 10.8 (L) 13.0 - 17.0 g/dL   HCT 32.5 (L) 39.0 - 52.0 %   MCV 83.8 78.0 - 100.0 fL   MCH 27.8 26.0 - 34.0 pg   MCHC 33.2 30.0 - 36.0 g/dL   RDW 18.9 (H) 11.5 - 15.5 %   Platelets 230 150 - 400 K/uL  Aerobic/Anaerobic Culture (surgical/deep wound)     Status: None (Preliminary result)   Collection Time: 03/05/16 12:57 PM  Result Value Ref Range   Specimen Description ABSCESS ABDOMEN  Special Requests Normal    Gram Stain      FEW WBC PRESENT, PREDOMINANTLY PMN ABUNDANT GRAM NEGATIVE COCCOBACILLI MODERATE GRAM POSITIVE COCCI IN PAIRS    Culture CULTURE REINCUBATED FOR BETTER GROWTH    Report Status PENDING   Comprehensive metabolic panel      Status: Abnormal   Collection Time: 03/06/16  1:21 AM  Result Value Ref Range   Sodium 134 (L) 135 - 145 mmol/L   Potassium 3.6 3.5 - 5.1 mmol/L   Chloride 100 (L) 101 - 111 mmol/L   CO2 26 22 - 32 mmol/L   Glucose, Bld 141 (H) 65 - 99 mg/dL   BUN 10 6 - 20 mg/dL   Creatinine, Ser 0.86 0.61 - 1.24 mg/dL   Calcium 7.8 (L) 8.9 - 10.3 mg/dL   Total Protein 5.4 (L) 6.5 - 8.1 g/dL   Albumin 1.9 (L) 3.5 - 5.0 g/dL   AST 22 15 - 41 U/L   ALT 22 17 - 63 U/L   Alkaline Phosphatase 111 38 - 126 U/L   Total Bilirubin 0.5 0.3 - 1.2 mg/dL   GFR calc non Af Amer >60 >60 mL/min   GFR calc Af Amer >60 >60 mL/min    Comment: (NOTE) The eGFR has been calculated using the CKD EPI equation. This calculation has not been validated in all clinical situations. eGFR's persistently <60 mL/min signify possible Chronic Kidney Disease.    Anion gap 8 5 - 15  CBC with Differential/Platelet     Status: Abnormal   Collection Time: 03/06/16  1:21 AM  Result Value Ref Range   WBC 9.7 4.0 - 10.5 K/uL   RBC 3.75 (L) 4.22 - 5.81 MIL/uL   Hemoglobin 10.4 (L) 13.0 - 17.0 g/dL   HCT 31.8 (L) 39.0 - 52.0 %   MCV 84.8 78.0 - 100.0 fL   MCH 27.7 26.0 - 34.0 pg   MCHC 32.7 30.0 - 36.0 g/dL   RDW 19.5 (H) 11.5 - 15.5 %   Platelets 293 150 - 400 K/uL   Neutrophils Relative % 86 %   Neutro Abs 8.4 (H) 1.7 - 7.7 K/uL   Lymphocytes Relative 5 %   Lymphs Abs 0.5 (L) 0.7 - 4.0 K/uL   Monocytes Relative 7 %   Monocytes Absolute 0.7 0.1 - 1.0 K/uL   Eosinophils Relative 2 %   Eosinophils Absolute 0.2 0.0 - 0.7 K/uL   Basophils Relative 0 %   Basophils Absolute 0.0 0.0 - 0.1 K/uL  Magnesium     Status: None   Collection Time: 03/06/16  1:21 AM  Result Value Ref Range   Magnesium 1.8 1.7 - 2.4 mg/dL    Ct Abdomen Pelvis W Contrast  Result Date: 03/04/2016 CLINICAL DATA:  Fever, low back pain, LEFT foot pain, shortness of breath, tachycardia, generalized abdominal pain, history renal cell carcinoma, rectal  cancer, kidney stones, former smoker EXAM: CT ABDOMEN AND PELVIS WITH CONTRAST TECHNIQUE: Multidetector CT imaging of the abdomen and pelvis was performed using the standard protocol following bolus administration of intravenous contrast. Sagittal and coronal MPR images reconstructed from axial data set. CONTRAST:  169m ISOVUE-300 IOPAMIDOL (ISOVUE-300) INJECTION 61% COMPARISON:  01/26/2016, 01/12/2016 FINDINGS: Lower chest: Bibasilar pulmonary metastases again identified. 2.1 x 1.7 cm diameter RIGHT lower lobe metastasis image 7 has cavitated centrally since previous exam. LEFT lung base metastasis 17 x 13 mm image 15 appeared larger on the previous exam at approximately 17 x 18 mm. Other nodules appear grossly  stable Hepatobiliary: Mild diffuse fatty infiltration of liver. Hypervascular focus lateral segment LEFT lobe liver 11 mm diameter image 19 Pancreas: Chronic calcific pancreatitis changes. No definite pancreatic mass or fluid collection. Spleen: Normal appearance Adrenals/Urinary Tract: Adrenal glands normal appearance. BILATERAL nephrostomy tubes without renal mass or hydronephrosis. Normal caliber ureters and bladder. Suprapubic catheter in bladder. Mild diffuse bladder wall thickening which could reflect chronic infection, chronic outlet obstruction, or prior radiation therapy. Stomach/Bowel: Normal appendix. LEFT lower quadrant colostomy with parastomal herniation of nonobstructed small bowel loops. Scattered stool throughout colon. Post perineal proctectomy per chart. Large gas and fluid collection and presacral space, with thickened wall overall measuring 6.2 x 5.9 x 9.2 cm. Collection appears larger and the presence of air within the collection is new since the previous exam. Vascular/Lymphatic: Atherosclerotic calcifications aorta without aneurysm. Reproductive: N/A Other: No free air or free fluid. Musculoskeletal: Infiltrative changes and enlargement of the LEFT gluteus maximus, containing  probable fluid and multiple foci of gas consistent with infection, potentially by gas-forming organism versus extension of the presacral space process. No acute osseous findings. IMPRESSION: Increase in size of a presacral collection now 6.2 x 5.9 x 9.2 cm, newly containing gas, question abscess. New enlargement and gas within the LEFT gluteus maximus likely representing infection, question by a gas-forming organism. Interval resolution of BILATERAL hydronephrosis post nephrostomy placement. 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 Pulmonary metastases, with a LEFT lower lobe lesion appearing slightly smaller and a RIGHT lower lead lesion now cavitated. Bladder wall thickening question due to chronic infection, chronic outlet obstruction, or prior pelvic irradiation. Chronic calcific pancreatitis. 11 mm hypervascular focus lateral segment LEFT lobe liver cannot exclude hypervascular metastasis. Findings called to Dr. Roderic Palau on 03/04/2016 at 1800 hours. Electronically Signed   By: Lavonia Dana M.D.   On: 03/04/2016 18:01   Dg Chest Port 1 View  Result Date: 03/04/2016 CLINICAL DATA:  Several days of fever, tachycardia, back pain, and shortness of breath. Patient recently hospitalized for severe kidney infection. EXAM: PORTABLE CHEST 1 VIEW COMPARISON:  Portable chest x-ray of January 25, 2016 FINDINGS: The lungs are adequately inflated. The interstitial markings are prominent especially on the right. The heart is mildly enlarged. The central pulmonary vascularity is prominent. The mediastinum is normal in width. There is no pleural effusion. The power port catheter tip projects over the midportion of the SVC. The bony thorax is unremarkable. IMPRESSION: Mild interstitial prominence greatest on the left not as conspicuous as on the previous study. This likely reflects low-grade pulmonary edema. There is no alveolar pneumonia nor pleural  effusion. When the patient can tolerate the procedure, a PA and lateral chest x-ray would be useful. Electronically Signed   By: David  Martinique M.D.   On: 03/04/2016 16:40   Ct Image Guided Drainage By Percutaneous Catheter  Result Date: 03/05/2016 INDICATION: Fever, elevated white count, recurrent presacral pelvic abscess. 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 EXAM: CT GUIDED DRAINAGE OF RECURRENT PRESACRAL PELVIC ABSCESS MEDICATIONS: The patient is currently admitted to the hospital and receiving intravenous antibiotics. The antibiotics were administered within an appropriate time frame prior to the initiation of the procedure. ANESTHESIA/SEDATION: 2.0 mg IV Versed 200 mcg IV Fentanyl Moderate Sedation Time:  14 MINUTES The patient was continuously monitored during the procedure by the interventional radiology nurse under my direct supervision. COMPLICATIONS: None immediate. TECHNIQUE: Informed written consent was obtained from the patient after a thorough discussion of the procedural  risks, benefits and alternatives. All questions were addressed. Maximal Sterile Barrier Technique was utilized including caps, mask, sterile gowns, sterile gloves, sterile drape, hand hygiene and skin antiseptic. A timeout was performed prior to the initiation of the procedure. PROCEDURE: Previous imaging reviewed. Patient positioned prone. Noncontrast localization CT performed. The presacral pelvic abscess was localized. Abscess cavity now containing dilute contrast, suspect from fistula the bladder or bowel The left gluteal region was prepped with ChloraPrep in a sterile fashion, and a sterile drape was applied covering the operative field. A sterile gown and sterile gloves were used for the procedure. Local anesthesia was provided with 1% Lidocaine. Under sterile conditions and local anesthesia, an 18 gauge 10 cm access needle was advanced from a left trans gluteal approach into the fluid collection. Needle position confirmed.  Syringe aspiration yielded fecal contaminated fluid. Guidewire inserted followed by tract dilatation to advance a 10 [place] drain. Drain catheter position confirmed with CT. Syringe aspiration yielded 50 cc purulent fecal contaminated fluid. Catheter secured with a Prolene suture and connected to external gravity drainage bag. Sterile dressing applied. No immediate complication. Patient tolerated the procedure well. FINDINGS: CT imaging confirms needle access of the presacral abscess for drain insertion. IMPRESSION: Successful CT-guided left trans gluteal presacral abscess drain insertion. Dilute contrast within the abscess cavity from the recent CT, suspicious for fistula from either the bowel or bladder. Electronically Signed   By: Jerilynn Mages.  Shick M.D.   On: 03/05/2016 13:06    Review of Systems  Constitutional: Positive for fever and malaise/fatigue.  HENT: Negative.   Eyes: Negative.   Respiratory: Negative.   Cardiovascular: Negative.   Gastrointestinal: Positive for abdominal pain.  Genitourinary:       Pelvic pain/gluteal pain.    Skin: Negative.   Neurological: Positive for weakness.  Endo/Heme/Allergies: Negative.   Psychiatric/Behavioral: Positive for depression (down about circumstances.) and substance abuse (tobacco).   Blood pressure 115/66, pulse 89, temperature 98.7 F (37.1 C), temperature source Oral, resp. rate (!) 22, height '5\' 5"'  (1.651 m), weight 98.9 kg (218 lb), SpO2 96 %. Physical Exam  Constitutional: Andrew Kline is oriented to person, place, and time. Andrew Kline appears well-developed and well-nourished. Andrew Kline appears distressed (looks uncomfortable).  HENT:  Head: Normocephalic and atraumatic.  Eyes: Conjunctivae are normal. Pupils are equal, round, and reactive to light. No scleral icterus.  Neck: Normal range of motion. Neck supple.  Cardiovascular: Normal rate and regular rhythm.   Respiratory: Effort normal and breath sounds normal. No respiratory distress.  GI: Soft. Andrew Kline exhibits no  distension. There is no tenderness.  SP tube in place  Musculoskeletal: Normal range of motion. Andrew Kline exhibits edema. Andrew Kline exhibits no tenderness.  Neurological: Andrew Kline is alert and oriented to person, place, and time.  Skin: Skin is warm and dry. No rash noted. Andrew Kline is not diaphoretic. No pallor.  Psychiatric: Andrew Kline has a normal mood and affect. His behavior is normal. Judgment and thought content normal.  Transgluteal drain serosanguinous   Assessment/Plan: Metastatic rectal cancer to lungs S/p palliative APR c/b urethral injury. Delayed presacral soft tissue density/fluid collection Urinary leak via prostatic urethra Presence of bilateral perc nephrostomy tubes. Parastomal hernia with small bowel, non obstructed. Question of enteric fistula.  Will discuss with our colorectal surgeon.  Will also need discussion with Dr. Arnoldo Morale and [place].  Additional workup will likely be done as an outpatient, but will get the ball rolling.  Continue perc drainage with antibiotics and flushes.    I think it is unlikely that  any enteric structure is fistulized to this collection.  It appears to be serosanguinous or consistent with urine.  I will send for creatinine.  I cannot see on the scan where any colon would have fistulized to the fluid collection.  There is one loop of small bowel that may be tethered at the top of this area.   ? Concern for recurrent cancer in that area.  Pt desires to be aggressive.      Tamecca Artiga 03/06/2016, 11:58 AM

## 2016-03-07 ENCOUNTER — Telehealth (HOSPITAL_COMMUNITY): Payer: Self-pay | Admitting: *Deleted

## 2016-03-07 ENCOUNTER — Encounter (HOSPITAL_COMMUNITY): Payer: Self-pay | Admitting: Radiology

## 2016-03-07 ENCOUNTER — Ambulatory Visit (HOSPITAL_COMMUNITY): Payer: Medicaid Other

## 2016-03-07 ENCOUNTER — Ambulatory Visit (HOSPITAL_COMMUNITY): Payer: Medicaid Other | Admitting: Oncology

## 2016-03-07 ENCOUNTER — Inpatient Hospital Stay (HOSPITAL_COMMUNITY): Payer: Medicaid Other

## 2016-03-07 DIAGNOSIS — L03116 Cellulitis of left lower limb: Secondary | ICD-10-CM

## 2016-03-07 DIAGNOSIS — Z936 Other artificial openings of urinary tract status: Secondary | ICD-10-CM

## 2016-03-07 DIAGNOSIS — L0231 Cutaneous abscess of buttock: Secondary | ICD-10-CM

## 2016-03-07 HISTORY — PX: IR GENERIC HISTORICAL: IMG1180011

## 2016-03-07 LAB — BASIC METABOLIC PANEL
ANION GAP: 7 (ref 5–15)
BUN: 5 mg/dL — ABNORMAL LOW (ref 6–20)
CALCIUM: 7.8 mg/dL — AB (ref 8.9–10.3)
CHLORIDE: 100 mmol/L — AB (ref 101–111)
CO2: 27 mmol/L (ref 22–32)
CREATININE: 0.75 mg/dL (ref 0.61–1.24)
GFR calc Af Amer: >60 mL/min (ref >60)
GFR calc non Af Amer: >60 mL/min (ref >60)
GLUCOSE: 108 mg/dL — AB (ref 65–99)
Potassium: 3.3 mmol/L — ABNORMAL LOW (ref 3.5–5.1)
Sodium: 134 mmol/L — ABNORMAL LOW (ref 135–145)

## 2016-03-07 LAB — CBC WITH DIFFERENTIAL/PLATELET
Basophils Absolute: 0 10*3/uL (ref 0.0–0.1)
Basophils Relative: 0 %
Eosinophils Absolute: 0.4 10*3/uL (ref 0.0–0.7)
Eosinophils Relative: 4 %
HEMATOCRIT: 30.9 % — AB (ref 39.0–52.0)
HEMOGLOBIN: 10 g/dL — AB (ref 13.0–17.0)
LYMPHS PCT: 7 %
Lymphs Abs: 0.6 10*3/uL — ABNORMAL LOW (ref 0.7–4.0)
MCH: 27.2 pg (ref 26.0–34.0)
MCHC: 32.4 g/dL (ref 30.0–36.0)
MCV: 84 fL (ref 78.0–100.0)
MONO ABS: 0.6 10*3/uL (ref 0.1–1.0)
MONOS PCT: 7 %
NEUTROS ABS: 7.1 10*3/uL (ref 1.7–7.7)
NEUTROS PCT: 82 %
Platelets: 320 10*3/uL (ref 150–400)
RBC: 3.68 MIL/uL — ABNORMAL LOW (ref 4.22–5.81)
RDW: 19.1 % — AB (ref 11.5–15.5)
WBC: 8.6 10*3/uL (ref 4.0–10.5)

## 2016-03-07 MED ORDER — IOPAMIDOL (ISOVUE-300) INJECTION 61%
INTRAVENOUS | Status: AC
  Administered 2016-03-07: 100 mL
  Filled 2016-03-07: qty 100

## 2016-03-07 MED ORDER — POTASSIUM CHLORIDE CRYS ER 20 MEQ PO TBCR
40.0000 meq | EXTENDED_RELEASE_TABLET | ORAL | Status: AC
  Administered 2016-03-07 (×2): 40 meq via ORAL
  Filled 2016-03-07 (×2): qty 2

## 2016-03-07 MED ORDER — LIDOCAINE HCL 1 % IJ SOLN
INTRAMUSCULAR | Status: AC
  Filled 2016-03-07: qty 20

## 2016-03-07 MED ORDER — LIDOCAINE HCL 1 % IJ SOLN
INTRAMUSCULAR | Status: AC | PRN
  Administered 2016-03-07: 5 mL

## 2016-03-07 MED ORDER — IOPAMIDOL (ISOVUE-300) INJECTION 61%
INTRAVENOUS | Status: AC
  Administered 2016-03-07: 15 mL
  Filled 2016-03-07: qty 50

## 2016-03-07 NOTE — Progress Notes (Signed)
03/07/2046 Rn empty 200cc of yellow urine from [facility] pubic cath at 1745. [facility] RN.

## 2016-03-07 NOTE — Progress Notes (Signed)
PT Cancellation Note  Patient Details Name: Alp Goldwater MRN: [id number] DOB: 1966-01-17   Cancelled Treatment:    Reason Eval/Treat Not Completed: Patient at procedure or test/unavailable. Pt appears to be on the way to radiology.   Calley Drenning 03/07/2016, 2:27 PM [facility] PT [phone]

## 2016-03-07 NOTE — Progress Notes (Signed)
PROGRESS NOTE    Andrew Kline  MRN:[id number] DOB: 04/11/1966 DOA: 03/04/2016 PCP: Penland,Shannon Kristen, MD    Brief Narrative:  Patient is a 50-year-old male with history of metastatic rectal cancer status post colostomy, renal cell carcinoma of the right kidney status post partial nephrectomy with bilateral nephrostomy tubes and prior presacral abscesses requiring admission 925-10 1 present with recurrent infection. Patient was seen at [place] by general surgery who felt no surgical intervention was needed at this time and that patient will require percutaneous drainage via interventional radiology. Patient underwent CT-guided left transgluteal drain placement with fecal fluid aspirated and culture sent of concern for fistula to bladder or bowel. General surgery consulted. ID is also been consulted and patient currently on IV Zosyn.   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 Sepsis protocol was obtained. Chest x-ray done was negative for any acute infiltrate. Patient has been pancultured. CT abdomen and pelvis which was abnormal did show a increasing size of presacral collection now at 6.2 x 5.9 x 9.2 cm newly containing gas. Admitting physician discussed CT findings with Dr. Jenkins of general surgery at [place] who had recommended transfer to [facility] for percutaneous drainage of presacral abscess by interventional radiology. Patient was on IV clindamycin however has been changed to IV Zosyn per ID  recommendations. Patient is currently afebrile. WBC trending down. Blood cultures pending. Per admitting physician if patient needs eventual I and D on gluteal abscess patient could be transferred back to [place] and Dr. Jenkins will be happy to see him however per admitting physician. Dr. Jenkins of general surgery does not believe any surgical intervention is needed at this time.  Patient status post CT-guided transgluteal pelvic drain placement interventional radiology 03/05/2016 which had 50 mL of exudative, fecal fluid aspirated. Cultures from abscess with moderate gram-positive cocci. His, abundant gram-negative Coco bacilli. Concern for fistula to bladder and bowel. Interventional radiology. Patient has been seen in consultation by general surgery who recommended drainage by IR of any fluid collection, follow up with urology, bleak prognosis. Saline lock IV fluids. Continue IV Zosyn. Will likely need to follow-up with his general surgeon in the outpatient setting. ID following and appreciate input and recommendations.  #2 hyponatremia Likely secondary to hypovolemic hyponatremia. Sodium levels improving with hydration. Saline lock IV fluids. Follow.  #3 history of rectal cancer/renal cell carcinoma status post bilateral nephrostomy tubes. Patient with history of metastatic rectal cancer status post colostomy, renal cell carcinoma of the right kidney status post partial nephrectomy with bilateral nephrostomy tubes. CT abdomen and pelvis with pulmonary metastases and possible liver metastases. Outpatient follow-up with his oncologist.  #4 left upper thigh swelling Patient with left upper thigh swelling and left buttocks tightness with slight erythema. Percutaneous drain noted. Blood cultures pending. Fluid collection cultures pending. On IV Zosyn. Check a CT of left thigh/hip.  #5 depression Continue Cymbalta.  #6 probable complicated UTI Urine cultures with 70,000 colonies of yeast.     DVT prophylaxis: Lovenox Code Status: Full Family Communication: Updated patient. No family   at bedside. Disposition Plan: Home when medically stable and sepsis has resolved.   Consultants:   Interventional radiology Dr. Annamaria Boots 03/05/2016  Infectious diseases Dr. Baxter Flattery 03/05/2016  General surgery: Dr. Barry Dienes 03/06/2016  Wound care  Procedures:   CT abdomen and pelvis 03/04/2016  Chest x-ray 03/04/2016  CT-guided left transgluteal pelvic drain placement with 50 mL of exudative, fecal fluid aspirated per Dr. Annamaria Boots 03/05/2016  Antimicrobials:   IV Zosyn 03/04/2016 1 dose.  IV vancomycin 03/04/2016 1 dose  IV clindamycin 03/05/2016>>> 03/05/2016  IV Zosyn 03/06/2016     Subjective: Patient states improvement witH lower abdominal. Patient complaining of buttocks pain. Patient also complaining of pain around right nephrostomy site. Patient currently eating breakfast.   Objective: Vitals:   03/06/16 1641 03/06/16 1936 03/07/16 0024 03/07/16 0534  BP: 126/71 (!) 134/54 110/66 103/60  Pulse: 96 (!) 103 91 90  Resp: (!) _0 Temp: 98.3 F (36.8 C) 97.6 F (36.4 C) 98.7 F (37.1 C) 98.1 F (36.7 C)  TempSrc: Oral Oral Axillary Oral  SpO2: 97% 99% 92% 93%  Weight:      Height:        Intake/Output Summary (Last 24 hours) at 03/07/16 0953 Last data filed at 03/07/16 0800  Gross per 24 hour  Intake          2956.67 ml  Output             4175 ml  Net         -1218.33 ml   Filed Weights   03/04/16 1544 03/04/16 2228  Weight: 81.6 kg (180 lb) 98.9 kg (218 lb)    Examination:  General exam: Appears calm and comfortable  Respiratory system: Decreased breath sounds in the bases. No wheezing Respiratory effort normal. Cardiovascular system: S1 & S2 heard, RRR. No JVD, murmurs, rubs, gallops or clicks. No pedal edema. Gastrointestinal system: Abdomen is obese, nondistended, soft and tender to palpation in the lower abdominal region. Colostomy bag  with stool. Bilateral percutaneous nephrostomy tubes noted. Left gluteal drain with serosanguineous fluid. No organomegaly or masses felt. Normal bowel sounds heard. Central nervous system: Alert and oriented. No focal neurological deficits. Extremities: Left buttocks and upper thigh with some erythema, tightness, significant tenderness to palpation. Symmetric 5 x 5 power. Skin: Some erythema on the left buttocks and upper thigh. Psychiatry: Judgement and insight appear normal. Mood & affect appropriate.     Data Reviewed: I have personally reviewed following labs and imaging studies  CBC:  Recent Labs Lab 03/04/16 1610 03/04/16 1616 03/05/16 0122 03/06/16 0121 03/07/16 0319  WBC  --  18.0* 13.3* 9.7 8.6  NEUTROABS  --  15.3*  --  8.4* 7.1  HGB 14.6 13.5 10.8* 10.4* 10.0*  HCT 43.0 39.8 32.5* 31.8* 30.9*  MCV  --  83.8 83.8 84.8 84.0  PLT  --  288 230 293 628   Basic Metabolic Panel:  Recent Labs Lab 03/04/16 1610 03/04/16 1616 03/05/16 0122 03/06/16 0121 03/07/16 0319  NA 128* 124* 133* 134* 134*  K 3.6 3.4* 3.6 3.6 3.3*  CL 90* 91* 104 100* 100*  CO2  --  21* _1 GLUCOSE 168* 166* 145* 141* 108*  BUN _2 5*  CREATININE 0.90 0.98 0.91 0.86 0.75  CALCIUM  --  8.1* 7.1* 7.8* 7.8*  MG  --   --   --  1.8  --    GFR: Estimated Creatinine Clearance: 116.9 mL/min (by  C-G formula based on SCr of 0.75 mg/dL). Liver Function Tests:  Recent Labs Lab 03/04/16 1616 03/06/16 0121  AST 40 22  ALT 33 22  ALKPHOS 154* 111  BILITOT 1.3* 0.5  PROT 7.2 5.4*  ALBUMIN 3.1* 1.9*   No results for input(s): LIPASE, AMYLASE in the last 168 hours. No results for input(s): AMMONIA in the last 168 hours. Coagulation Profile:  Recent Labs Lab 03/04/16 2313  INR 1.33   Cardiac Enzymes: No results for input(s): CKTOTAL, CKMB, CKMBINDEX, TROPONINI in the last 168 hours. BNP (last 3 results) No results for input(s): PROBNP in the last 8760 hours. HbA1C: No results  for input(s): HGBA1C in the last 72 hours. CBG: No results for input(s): GLUCAP in the last 168 hours. Lipid Profile: No results for input(s): CHOL, HDL, LDLCALC, TRIG, CHOLHDL, LDLDIRECT in the last 72 hours. Thyroid Function Tests: No results for input(s): TSH, T4TOTAL, FREET4, T3FREE, THYROIDAB in the last 72 hours. Anemia Panel: No results for input(s): VITAMINB12, FOLATE, FERRITIN, TIBC, IRON, RETICCTPCT in the last 72 hours. Sepsis Labs:  Recent Labs Lab 03/04/16 1614 03/04/16 1930 03/04/16 2313 03/05/16 0107  PROCALCITON  --   --  5.85  --   LATICACIDVEN 3.40* 1.11 1.9 1.2    Recent Results (from the past 240 hour(s))  Culture, blood (Routine x 2)     Status: None (Preliminary result)   Collection Time: 03/04/16  4:16 PM  Result Value Ref Range Status   Specimen Description BLOOD RIGHT HAND  Final   Special Requests BOTTLES DRAWN AEROBIC AND ANAEROBIC 8CC EACH  Final   Culture NO GROWTH 3 DAYS  Final   Report Status PENDING  Incomplete  Culture, blood (Routine x 2)     Status: None (Preliminary result)   Collection Time: 03/04/16  4:17 PM  Result Value Ref Range Status   Specimen Description LEFT ANTECUBITAL  Final   Special Requests BOTTLES DRAWN AEROBIC AND ANAEROBIC [place]  Final   Culture NO GROWTH 3 DAYS  Final   Report Status PENDING  Incomplete  Urine culture     Status: Abnormal   Collection Time: 03/04/16  4:37 PM  Result Value Ref Range Status   Specimen Description URINE, CLEAN CATCH  Final   Special Requests NONE  Final   Culture 70,000 COLONIES/mL YEAST (A)  Final   Report Status 03/06/2016 FINAL  Final  MRSA PCR Screening     Status: None   Collection Time: 03/04/16 10:37 PM  Result Value Ref Range Status   MRSA by PCR NEGATIVE NEGATIVE Final    Comment:        The GeneXpert MRSA Assay (FDA approved for NASAL specimens only), is one component of a comprehensive MRSA colonization surveillance program. It is not intended to diagnose  MRSA infection nor to guide or monitor treatment for MRSA infections.   Culture, blood (x 2)     Status: None (Preliminary result)   Collection Time: 03/04/16 11:13 PM  Result Value Ref Range Status   Specimen Description BLOOD RIGHT ARM  Final   Special Requests BOTTLES DRAWN AEROBIC AND ANAEROBIC 10ML  Final   Culture NO GROWTH 1 DAY  Final   Report Status PENDING  Incomplete  Culture, blood (x 2)     Status: None (Preliminary result)   Collection Time: 03/04/16 11:20 PM  Result Value Ref Range Status   Specimen Description BLOOD RIGHT HAND  Final   Special Requests AEROBIC BOTTLE ONLY 6ML  Final  Culture NO GROWTH 1 DAY  Final   Report Status PENDING  Incomplete  Aerobic/Anaerobic Culture (surgical/deep wound)     Status: None (Preliminary result)   Collection Time: 03/05/16 12:57 PM  Result Value Ref Range Status   Specimen Description ABSCESS ABDOMEN  Final   Special Requests Normal  Final   Gram Stain   Final    FEW WBC PRESENT, PREDOMINANTLY PMN ABUNDANT GRAM NEGATIVE COCCOBACILLI MODERATE GRAM POSITIVE COCCI IN PAIRS    Culture CULTURE REINCUBATED FOR BETTER GROWTH  Final   Report Status PENDING  Incomplete         Radiology Studies: Ct Image Guided Drainage By Percutaneous Catheter  Result Date: 03/05/2016 INDICATION: Fever, elevated white count, recurrent presacral pelvic abscess. 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 EXAM: CT GUIDED DRAINAGE OF RECURRENT PRESACRAL PELVIC ABSCESS MEDICATIONS: The patient is currently admitted to the hospital and receiving intravenous antibiotics. The antibiotics were administered within an appropriate time frame prior to the initiation of the procedure. ANESTHESIA/SEDATION: 2.0 mg IV Versed 200 mcg IV Fentanyl Moderate Sedation Time:  14 MINUTES The patient was continuously monitored during the procedure by the interventional radiology nurse under my direct supervision. COMPLICATIONS: None immediate. TECHNIQUE: Informed written consent  was obtained from the patient after a thorough discussion of the procedural risks, benefits and alternatives. All questions were addressed. Maximal Sterile Barrier Technique was utilized including caps, mask, sterile gowns, sterile gloves, sterile drape, hand hygiene and skin antiseptic. A timeout was performed prior to the initiation of the procedure. PROCEDURE: Previous imaging reviewed. Patient positioned prone. Noncontrast localization CT performed. The presacral pelvic abscess was localized. Abscess cavity now containing dilute contrast, suspect from fistula the bladder or bowel The left gluteal region was prepped with ChloraPrep in a sterile fashion, and a sterile drape was applied covering the operative field. A sterile gown and sterile gloves were used for the procedure. Local anesthesia was provided with 1% Lidocaine. Under sterile conditions and local anesthesia, an 18 gauge 10 cm access needle was advanced from a left trans gluteal approach into the fluid collection. Needle position confirmed. Syringe aspiration yielded fecal contaminated fluid. Guidewire inserted followed by tract dilatation to advance a 10 [place] drain. Drain catheter position confirmed with CT. Syringe aspiration yielded 50 cc purulent fecal contaminated fluid. Catheter secured with a Prolene suture and connected to external gravity drainage bag. Sterile dressing applied. No immediate complication. Patient tolerated the procedure well. FINDINGS: CT imaging confirms needle access of the presacral abscess for drain insertion. IMPRESSION: Successful CT-guided left trans gluteal presacral abscess drain insertion. Dilute contrast within the abscess cavity from the recent CT, suspicious for fistula from either the bowel or bladder. Electronically Signed   By: Jerilynn Mages.  Shick M.D.   On: 03/05/2016 13:06        Scheduled Meds: . amitriptyline  50 mg Oral QHS  . DULoxetine  60 mg Oral Daily  . enoxaparin (LOVENOX) injection  40 mg  Subcutaneous Q24H  . fentaNYL  150 mcg Transdermal Q72H  . piperacillin-tazobactam (ZOSYN)  IV  3.375 g Intravenous Q8H  . potassium chloride  40 mEq Oral Q4H  . pregabalin  150 mg Oral BID  . senna-docusate  1 tablet Oral QHS  . sodium chloride flush  3 mL Intravenous Q12H   Continuous Infusions:    LOS: 3 days    Time spent: 35 minutes    Dorea Duff, MD [facility] Pager [phone]  If 7PM-7AM, please contact night-coverage [email or web address] Password [facility] 03/07/2016, 9:53  AM  

## 2016-03-07 NOTE — Progress Notes (Signed)
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  Invalid input(s): PCO2, PO2  Studies/Results: Ct Image Guided Drainage By Percutaneous Catheter  Result Date: 03/05/2016 INDICATION: Fever, elevated white count, recurrent presacral pelvic abscess. 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 EXAM: CT GUIDED DRAINAGE OF RECURRENT PRESACRAL PELVIC ABSCESS MEDICATIONS: The patient is currently admitted to the hospital and receiving intravenous antibiotics. The antibiotics were administered within an appropriate time frame prior to the initiation of the procedure. ANESTHESIA/SEDATION: 2.0 mg IV Versed 200 mcg IV Fentanyl Moderate Sedation Time:  14 MINUTES The patient was continuously monitored during the procedure by the interventional radiology nurse under my direct supervision. COMPLICATIONS: None immediate. TECHNIQUE: Informed written  consent was obtained from the patient after a thorough discussion of the procedural risks, benefits and alternatives. All questions were addressed. Maximal Sterile Barrier Technique was utilized including caps, mask, sterile gowns, sterile gloves, sterile drape, hand hygiene and skin antiseptic. A timeout was performed prior to the initiation of the procedure. PROCEDURE: Previous imaging reviewed. Patient positioned prone. Noncontrast localization CT performed. The presacral pelvic abscess was localized. Abscess cavity now containing dilute contrast, suspect from fistula the bladder or bowel The left gluteal region was prepped with ChloraPrep in a sterile fashion, and a sterile drape was applied covering the operative field. A sterile gown and sterile gloves were used for the procedure. Local anesthesia was provided with 1% Lidocaine. Under sterile conditions and local anesthesia, an 18 gauge 10 cm access needle was advanced from a left trans gluteal approach into the fluid collection. Needle position confirmed. Syringe aspiration yielded fecal contaminated fluid. Guidewire inserted followed by tract dilatation to advance a 10 [place] drain. Drain catheter position confirmed with CT. Syringe aspiration yielded 50 cc purulent fecal contaminated fluid. Catheter secured with a Prolene suture and connected to external gravity drainage bag. Sterile dressing applied. No immediate complication. Patient tolerated the procedure well. FINDINGS: CT imaging confirms needle access of the presacral abscess for drain insertion. IMPRESSION: Successful CT-guided left trans gluteal presacral abscess drain insertion. Dilute contrast within the abscess cavity from the recent CT, suspicious for fistula from either the bowel or bladder. Electronically Signed   By: Jerilynn Mages.  Shick M.D.   On: 03/05/2016 13:06    Anti-infectives: Anti-infectives    Start     Dose/Rate Route  Frequency Ordered Stop   03/06/16 0815  piperacillin-tazobactam  (ZOSYN) IVPB 3.375 g     3.375 g 12.5 mL/hr over 240 Minutes Intravenous Every 8 hours 03/06/16 0802     03/05/16 2300  clindamycin (CLEOCIN) IVPB 600 mg  Status:  Discontinued     600 mg 100 mL/hr over 30 Minutes Intravenous Every 8 hours 03/04/16 2236 03/05/16 1309   03/04/16 1615  piperacillin-tazobactam (ZOSYN) IVPB 3.375 g     3.375 g 100 mL/hr over 30 Minutes Intravenous  Once 03/04/16 1603 03/04/16 1641   03/04/16 1615  vancomycin (VANCOCIN) IVPB 1000 mg/200 mL premix     1,000 mg 200 mL/hr over 60 Minutes Intravenous  Once 03/04/16 1603 03/04/16 1735      Assessment/Plan: Patient Active Problem List   Diagnosis Date Noted  . Fever   . Abscess   . Sepsis ([place]) 03/04/2016  . Renal cell carcinoma ([place]) 03/04/2016  . Hyponatremia 03/04/2016  . Complicated UTI (urinary tract infection) 01/25/2016  . Pelvic fluid collection 11/06/2015  . Physical deconditioning 07/07/2015  . Muscular deconditioning 07/07/2015  . Malnutrition of moderate degree ([place]) 02/13/2015  . Rectal carcinoma ([place]) 02/11/2015  . Rectal cancer metastasized to lung ([place]) 10/30/2014  . Rectal pain 09/26/2014  . Multiple lung nodules on CT 09/26/2014   Complex situation and on chemotherapy with Avastin None of this will heal more than likely Recommend drainage by IR of any fluid collection Recommend urology follow this patient since well known to them Recommend letting operating surgeon in [place] be made aware of situation out of courtesy  Prognosis bleak May benefit from palliative care consult   Nothing else to add at this point     LOS: 3 days    Deanda Ruddell A. 03/07/2016

## 2016-03-07 NOTE — Progress Notes (Signed)
OT Cancellation Note  Patient Details Name: Andrew Kline MRN: [id number] DOB: 1965-10-15   Cancelled Treatment:    Reason Eval/Treat Not Completed: Patient at procedure or test/ unavailable. Will follow.  [name] 03/07/2016, 3:46 PM

## 2016-03-07 NOTE — Progress Notes (Signed)
03/07/2016 Rn was told by Odette Horns Recruitment consultant) superpubic cath had came out. Rn notified [facility] and spoke to Nurse at Dr Simonne Maffucci. Rn received a call back from nurse that primary MD at hospital to order [place]. Dr Grandville Silos was made aware and consult was placed for interventional radiology to place [place] cath. [facility] RN.

## 2016-03-07 NOTE — Procedures (Signed)
Interventional Radiology Procedure Note  Procedure: Rescue of supra-pubic catheter, with replacement of displaced 62F pigtail catheter. .  Complications: None Findings: The ostomy had closed and was stenotic.  When the catheter is upsized, would recommend moderate sedation for tissue dilation.   Recommendations:  - To gravity drain. - Do not submerge  - Routine care   Signed,  Dulcy Fanny. Earleen Newport, DO

## 2016-03-07 NOTE — Consult Note (Signed)
Chief Complaint: Patient was seen in consultation today for supra pubic catheter exchange/replacement/upsize Chief Complaint  Patient presents with  . Code Sepsis   at the request of Dr Irine Seal  Referring Physician(s): D Grandville Silos  Supervising Physician: Corrie Mckusick  Patient Status: [facility] - In-pt  History of Present Illness: Andrew Kline is a 50 y.o. 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  Imaging reviewed with Dr Earleen Newport Plan for replacement/upsize today Pt eating lunch now  Pt does complain of tenderness/pain at B PCN sites Will need order for exchange IR will be watching for same Plan for exchange soon  Past Medical History:  Diagnosis Date  . Anxiety   . Arthritis   . Depression   . Kidney stone 03/22/15   left  . Rectal cancer ([place])   . Rectal pain   . Renal cell cancer ([place])    2012.   [name] Shortness of breath dyspnea     Past Surgical History:  Procedure Laterality Date  . BIOPSY N/A 09/30/2014   Procedure: BIOPSY;  Surgeon: Danie Binder, MD;  Location: [facility] ORS;  Service: Endoscopy;  Laterality: N/A;  Anal Canal  . COLOSTOMY N/A 02/11/2015   Procedure: COLOSTOMY;  Surgeon: Aviva Signs, MD;  Location: [facility];  Service: General;  Laterality: N/A;  . FLEXIBLE SIGMOIDOSCOPY N/A 09/30/2014   Procedure: FLEXIBLE SIGMOIDOSCOPY;  Surgeon: Danie Binder, MD;  Location: [facility] OR[facility];  Service: Endoscopy;  Laterality: N/A;  . FLEXIBLE SIGMOIDOSCOPY N/A 10/17/2014   Procedure: FLEXIBLE SIGMOIDOSCOPY;  Surgeon: Danie Binder, MD;  Location: [facility] END[facility] SUITE;  Service: Endoscopy;  Laterality: N/A;  1325  . HYPOSPADIAS CORRECTION    . IR GENERIC HISTORICAL  12/17/2015   IR CATHETER  TUBE CHANGE 12/17/2015 [facility]-INTERV RAD  . IR GENERIC HISTORICAL  01/27/2016   IR NEPHROSTOMY PLACEMENT RIGHT 01/27/2016 [facility]-INTERV RAD  . IR GENERIC HISTORICAL  01/27/2016   IR NEPHROSTOMY PLACEMENT LEFT 01/27/2016 [facility]-INTERV RAD  . KNEE SURGERY Left    arthroscopy  . LAPAROSCOPIC PARTIAL NEPHRECTOMY  2012   right side  . LUNG BIOPSY Right   . PERINEAL PROCTECTOMY N/A 08/26/2015   Procedure:  PROCTECTOMY;  Surgeon: Aviva Signs, MD;  Location: [facility];  Service: General;  Laterality: N/A;  . RECTAL SURGERY      Allergies: Oxaliplatin  Medications: Prior to Admission medications   Medication Sig Start Date End Date Taking? Authorizing Provider  albuterol (PROVENTIL HFA;VENTOLIN HFA) 108 (90 Base) MCG/ACT inhaler Inhale 2 puffs into the lungs every 6 (six) hours as needed for wheezing or shortness of breath. 05/26/15  Yes Patrici Ranks, MD  amitriptyline (ELAVIL) 50 MG tablet Take 1 tablet (50 mg total) by mouth at bedtime. 12/31/15  Yes Patrici Ranks, MD  docusate sodium (COLACE) 100 MG capsule Take 1 capsule (100 mg total) by mouth every 12 (twelve) hours. Patient taking differently: Take 100 mg by mouth daily as needed for mild constipation or moderate constipation.  09/25/14  Yes Christopher Lawyer, PA-C  DULoxetine (CYMBALTA) 60 MG capsule Take 1 capsule (60 mg total) by mouth daily. 12/31/15  Yes Patrici Ranks, MD  fentaNYL (DURAGESIC - DOSED MCG/HR) 75 MCG/HR Place 2 patches (150 mcg total) onto  the skin every 3 (three) days. 02/01/16  Yes Manon Hilding Kefalas, PA-C  HYDROmorphone (DILAUDID) 4 MG tablet Take 1 tablet (4 mg total) by mouth every 4 (four) hours as needed for severe pain. 02/22/16  Yes Manon Hilding Kefalas, PA-C  pregabalin (LYRICA) 75 MG capsule Take 2 capsules (150 mg total) by mouth 2 (two) times daily. 12/31/15  Yes Patrici Ranks, MD  zolpidem (AMBIEN CR) 12.5 MG CR tablet Take 1 tablet (12.5 mg total) by mouth at bedtime as needed for sleep. 12/31/15  Yes Patrici Ranks,  MD     Family History  Problem Relation Age of Onset  . Healthy Mother   . Lung cancer Maternal Grandfather   . Colon cancer Maternal Uncle     age 1  . Diabetes Other     Social History   Social History  . Marital status: Divorced    Spouse name: N/A  . Number of children: 2  . Years of education: N/A   Occupational History  . disabled    Social History Main Topics  . Smoking status: Current Every Day Smoker    Packs/day: 0.50    Years: 30.00    Types: Cigarettes  . Smokeless tobacco: Never Used  . Alcohol use 0.0 oz/week     Comment: Occ  . Drug use: No  . Sexual activity: Yes    Birth control/ protection: None   Other Topics Concern  . None   Social History Narrative  . None     Review of Systems: A 12 point ROS discussed and pertinent positives are indicated in the HPI above.  All other systems are negative.  Review of Systems  Constitutional: Negative for appetite change and fever.  Respiratory: Negative for shortness of breath.   Psychiatric/Behavioral: Negative for behavioral problems and confusion.    Vital Signs: BP 103/60 (BP Location: Left Arm)   Pulse 90   Temp 98.1 F (36.7 C) (Oral)   Resp 15   Ht '5\' 5"'$  (1.651 m)   Wt 218 lb (98.9 kg) Comment: Weight taken by RN  SpO2 93%   BMI 36.28 kg/m   Physical Exam  Constitutional: He is oriented to person, place, and time.  Cardiovascular: Normal rate and regular rhythm.   Pulmonary/Chest: Effort normal and breath sounds normal.  Abdominal: Soft.  Neurological: He is alert and oriented to person, place, and time.  Skin: Skin is warm and dry.  Sites of B PCNs--clean and dry Tender No sign of infection Output yellow urine; sl cloudy Rt 1.8L yesterday; 300 cc today Lt 850 cc yesterday; 100 today Bun/Cr wnl Wbc wnl   Site of TG drain clean and dry NT No bleeding Output blood tinged Output 1.5 L yesterday; 100 cc in bag   Nursing note and vitals reviewed.   Mallampati  Score:  MD Evaluation Airway: WNL Heart: WNL Abdomen: WNL Chest/ Lungs: WNL ASA  Classification: 3 Mallampati/Airway Score: One  Imaging: Ct Abdomen Pelvis W Contrast  Result Date: 03/04/2016 CLINICAL DATA:  Fever, low back pain, LEFT foot pain, shortness of breath, tachycardia, generalized abdominal pain, history renal cell carcinoma, rectal cancer, kidney stones, former smoker EXAM: CT ABDOMEN AND PELVIS WITH CONTRAST TECHNIQUE: Multidetector CT imaging of the abdomen and pelvis was performed using the standard protocol following bolus administration of intravenous contrast. Sagittal and coronal MPR images reconstructed from axial data set. CONTRAST:  128m ISOVUE-300 IOPAMIDOL (ISOVUE-300) INJECTION 61% COMPARISON:  01/26/2016, 01/12/2016 FINDINGS: Lower chest: Bibasilar pulmonary  metastases again identified. 2.1 x 1.7 cm diameter RIGHT lower lobe metastasis image 7 has cavitated centrally since previous exam. LEFT lung base metastasis 17 x 13 mm image 15 appeared larger on the previous exam at approximately 17 x 18 mm. Other nodules appear grossly stable Hepatobiliary: Mild diffuse fatty infiltration of liver. Hypervascular focus lateral segment LEFT lobe liver 11 mm diameter image 19 Pancreas: Chronic calcific pancreatitis changes. No definite pancreatic mass or fluid collection. Spleen: Normal appearance Adrenals/Urinary Tract: Adrenal glands normal appearance. BILATERAL nephrostomy tubes without renal mass or hydronephrosis. Normal caliber ureters and bladder. Suprapubic catheter in bladder. Mild diffuse bladder wall thickening which could reflect chronic infection, chronic outlet obstruction, or prior radiation therapy. Stomach/Bowel: Normal appendix. LEFT lower quadrant colostomy with parastomal herniation of nonobstructed small bowel loops. Scattered stool throughout colon. Post perineal proctectomy per chart. Large gas and fluid collection and presacral space, with thickened wall overall  measuring 6.2 x 5.9 x 9.2 cm. Collection appears larger and the presence of air within the collection is new since the previous exam. Vascular/Lymphatic: Atherosclerotic calcifications aorta without aneurysm. Reproductive: N/A Other: No free air or free fluid. Musculoskeletal: Infiltrative changes and enlargement of the LEFT gluteus maximus, containing probable fluid and multiple foci of gas consistent with infection, potentially by gas-forming organism versus extension of the presacral space process. No acute osseous findings. IMPRESSION: Increase in size of a presacral collection now 6.2 x 5.9 x 9.2 cm, newly containing gas, question abscess. New enlargement and gas within the LEFT gluteus maximus likely representing infection, question by a gas-forming organism. Interval resolution of BILATERAL hydronephrosis post nephrostomy placement. 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 Pulmonary metastases, with a LEFT lower lobe lesion appearing slightly smaller and a RIGHT lower lead lesion now cavitated. Bladder wall thickening question due to chronic infection, chronic outlet obstruction, or prior pelvic irradiation. Chronic calcific pancreatitis. 11 mm hypervascular focus lateral segment LEFT lobe liver cannot exclude hypervascular metastasis. Findings called to Dr. Roderic Palau on 03/04/2016 at 1800 hours. Electronically Signed   By: Lavonia Dana M.D.   On: 03/04/2016 18:01   Dg Chest Port 1 View  Result Date: 03/04/2016 CLINICAL DATA:  Several days of fever, tachycardia, back pain, and shortness of breath. Patient recently hospitalized for severe kidney infection. EXAM: PORTABLE CHEST 1 VIEW COMPARISON:  Portable chest x-ray of January 25, 2016 FINDINGS: The lungs are adequately inflated. The interstitial markings are prominent especially on the right. The heart is mildly enlarged. The central pulmonary vascularity is prominent. The  mediastinum is normal in width. There is no pleural effusion. The power port catheter tip projects over the midportion of the SVC. The bony thorax is unremarkable. IMPRESSION: Mild interstitial prominence greatest on the left not as conspicuous as on the previous study. This likely reflects low-grade pulmonary edema. There is no alveolar pneumonia nor pleural effusion. When the patient can tolerate the procedure, a PA and lateral chest x-ray would be useful. Electronically Signed   By: David  Martinique M.D.   On: 03/04/2016 16:40   Ct Image Guided Drainage By Percutaneous Catheter  Result Date: 03/05/2016 INDICATION: Fever, elevated white count, recurrent presacral pelvic abscess. 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 EXAM: CT GUIDED DRAINAGE OF RECURRENT PRESACRAL PELVIC ABSCESS MEDICATIONS: The patient is currently admitted to the hospital and receiving intravenous antibiotics. The antibiotics were administered within an appropriate time frame prior to the initiation of the procedure. ANESTHESIA/SEDATION: 2.0 mg IV Versed  200 mcg IV Fentanyl Moderate Sedation Time:  14 MINUTES The patient was continuously monitored during the procedure by the interventional radiology nurse under my direct supervision. COMPLICATIONS: None immediate. TECHNIQUE: Informed written consent was obtained from the patient after a thorough discussion of the procedural risks, benefits and alternatives. All questions were addressed. Maximal Sterile Barrier Technique was utilized including caps, mask, sterile gowns, sterile gloves, sterile drape, hand hygiene and skin antiseptic. A timeout was performed prior to the initiation of the procedure. PROCEDURE: Previous imaging reviewed. Patient positioned prone. Noncontrast localization CT performed. The presacral pelvic abscess was localized. Abscess cavity now containing dilute contrast, suspect from fistula the bladder or bowel The left gluteal region was prepped with ChloraPrep in a sterile  fashion, and a sterile drape was applied covering the operative field. A sterile gown and sterile gloves were used for the procedure. Local anesthesia was provided with 1% Lidocaine. Under sterile conditions and local anesthesia, an 18 gauge 10 cm access needle was advanced from a left trans gluteal approach into the fluid collection. Needle position confirmed. Syringe aspiration yielded fecal contaminated fluid. Guidewire inserted followed by tract dilatation to advance a 10 [place] drain. Drain catheter position confirmed with CT. Syringe aspiration yielded 50 cc purulent fecal contaminated fluid. Catheter secured with a Prolene suture and connected to external gravity drainage bag. Sterile dressing applied. No immediate complication. Patient tolerated the procedure well. FINDINGS: CT imaging confirms needle access of the presacral abscess for drain insertion. IMPRESSION: Successful CT-guided left trans gluteal presacral abscess drain insertion. Dilute contrast within the abscess cavity from the recent CT, suspicious for fistula from either the bowel or bladder. Electronically Signed   By: Jerilynn Mages.  Shick M.D.   On: 03/05/2016 13:06    Labs:  CBC:  Recent Labs  03/04/16 1616 03/05/16 0122 03/06/16 0121 03/07/16 0319  WBC 18.0* 13.3* 9.7 8.6  HGB 13.5 10.8* 10.4* 10.0*  HCT 39.8 32.5* 31.8* 30.9*  PLT 288 230 293 320    COAGS:  Recent Labs  09/23/15 0900 11/12/15 0945 01/26/16 1540 03/04/16 2313  INR 1.15 1.12 1.00 1.33  APTT 38*  --   --  41*    BMP:  Recent Labs  03/04/16 1616 03/05/16 0122 03/06/16 0121 03/07/16 0319  NA 124* 133* 134* 134*  K 3.4* 3.6 3.6 3.3*  CL 91* 104 100* 100*  CO2 21* '22 26 27  '$ GLUCOSE 166* 145* 141* 108*  BUN '8 8 10 '$ 5*  CALCIUM 8.1* 7.1* 7.8* 7.8*  CREATININE 0.98 0.91 0.86 0.75  GFRNONAA >60 >60 >60 >60  GFRAA >60 >60 >60 >60    LIVER FUNCTION TESTS:  Recent Labs  02/09/16 0854 02/23/16 0915 03/04/16 1616 03/06/16 0121  BILITOT 0.2*  0.3 1.3* 0.5  AST 18 18 40 22  ALT 13* 17 33 22  ALKPHOS 99 87 154* 111  PROT 7.4 5.8* 7.2 5.4*  ALBUMIN 3.5 2.9* 3.1* 1.9*    TUMOR MARKERS:  Recent Labs  08/18/15 0835 09/08/15 0903 11/06/15 1245 02/09/16 0854  CEA 3.2 2.1 3.9 6.0*    Assessment and Plan:  Rectal Ca Renal Ca B PCNs intact-- placed 01/27/16 TG abscess drain intact--placed 03/05/16 Suprapubic cath placed 08/2015---dislodged Plan for SP catheter replacement/upsize today Pt is aware of procedure benefits and risks including but limited to: infection;bleeding; damage to surrounding area. Agreeable to proceed Consent signed and in chart  Will plan for B PCNs exchange soon----will need order and npo   Thank you for this interesting  consult.  I greatly enjoyed meeting Tabb Croghan and look forward to participating in their care.  A copy of this report was sent to the requesting provider on this date.  Electronically Signed: Krishon Adkison A 03/07/2016, 1:55 PM   I spent a total of 40 Minutes    in face to face in clinical consultation, greater than 50% of which was counseling/coordinating care for SP catheter exchange

## 2016-03-07 NOTE — Consult Note (Addendum)
[place] Nurse ostomy follow up Current pouch is intact with good seal, mod amt brown drainage.  Supplies at bedside for staff nurse use.  Reviewed troubleshooting techniques and products for peristoma hernias to assist with maintaining a seal.  Pt denies need for further assistance or questions at this time. Please re-consult if further assistance is needed.  Thank-you,  Julien Girt MSN, [place], [place], [place], [place]

## 2016-03-07 NOTE — Progress Notes (Signed)
[place] for Infectious Disease   Reason for visit: Follow up on gluteal abscess  Interval History: he continues to have a drain for the abscess, bilateral nephrostomy drains.  Gram stain with GNC and GPC.  Complaint of constant pain with breathing, movement;  He expresses his desire to focus more on comfort and palliation. Day 3 pip/tazo   Physical Exam: Constitutional:  Vitals:   03/07/16 0024 03/07/16 0534  BP: 110/66 103/60  Pulse: 91 90  Resp: 12 15  Temp: 98.7 F (37.1 C) 98.1 F (36.7 C)   patient appears in NAD Eyes: anicteric HENT: no thrush Respiratory: Normal respiratory effort; CTA B Cardiovascular: RRR MS: left leg warm on upper thigh, erythematous Drains: abscess drain and two nephrostomy drains  Review of Systems: Constitutional: negative for fevers and chills Gastrointestinal: negative for diarrhea Musculoskeletal: negative for muscle weakness  Lab Results  Component Value Date   WBC 8.6 03/07/2016   HGB 10.0 (L) 03/07/2016   HCT 30.9 (L) 03/07/2016   MCV 84.0 03/07/2016   PLT 320 03/07/2016    Lab Results  Component Value Date   CREATININE 0.75 03/07/2016   BUN 5 (L) 03/07/2016   NA 134 (L) 03/07/2016   K 3.3 (L) 03/07/2016   CL 100 (L) 03/07/2016   CO2 27 03/07/2016    Lab Results  Component Value Date   ALT 22 03/06/2016   AST 22 03/06/2016   ALKPHOS 111 03/06/2016     Microbiology: Recent Results (from the past 240 hour(s))  Culture, blood (Routine x 2)     Status: None (Preliminary result)   Collection Time: 03/04/16  4:16 PM  Result Value Ref Range Status   Specimen Description BLOOD RIGHT HAND  Final   Special Requests BOTTLES DRAWN AEROBIC AND ANAEROBIC 8CC EACH  Final   Culture NO GROWTH 3 DAYS  Final   Report Status PENDING  Incomplete  Culture, blood (Routine x 2)     Status: None (Preliminary result)   Collection Time: 03/04/16  4:17 PM  Result Value Ref Range Status   Specimen Description LEFT ANTECUBITAL   Final   Special Requests BOTTLES DRAWN AEROBIC AND ANAEROBIC 6CC EACH  Final   Culture NO GROWTH 3 DAYS  Final   Report Status PENDING  Incomplete  Urine culture     Status: Abnormal   Collection Time: 03/04/16  4:37 PM  Result Value Ref Range Status   Specimen Description URINE, CLEAN CATCH  Final   Special Requests NONE  Final   Culture 70,000 COLONIES/mL YEAST (A)  Final   Report Status 03/06/2016 FINAL  Final  MRSA PCR Screening     Status: None   Collection Time: 03/04/16 10:37 PM  Result Value Ref Range Status   MRSA by PCR NEGATIVE NEGATIVE Final    Comment:        The GeneXpert MRSA Assay (FDA approved for NASAL specimens only), is one component of a comprehensive MRSA colonization surveillance program. It is not intended to diagnose MRSA infection nor to guide or monitor treatment for MRSA infections.   Culture, blood (x 2)     Status: None (Preliminary result)   Collection Time: 03/04/16 11:13 PM  Result Value Ref Range Status   Specimen Description BLOOD RIGHT ARM  Final   Special Requests BOTTLES DRAWN AEROBIC AND ANAEROBIC 10ML  Final   Culture NO GROWTH 1 DAY  Final   Report Status PENDING  Incomplete  Culture, blood (x 2)  Status: None (Preliminary result)   Collection Time: 03/04/16 11:20 PM  Result Value Ref Range Status   Specimen Description BLOOD RIGHT HAND  Final   Special Requests AEROBIC BOTTLE ONLY 6ML  Final   Culture NO GROWTH 1 DAY  Final   Report Status PENDING  Incomplete  Aerobic/Anaerobic Culture (surgical/deep wound)     Status: None (Preliminary result)   Collection Time: 03/05/16 12:57 PM  Result Value Ref Range Status   Specimen Description ABSCESS ABDOMEN  Final   Special Requests Normal  Final   Gram Stain   Final    FEW WBC PRESENT, PREDOMINANTLY PMN ABUNDANT GRAM NEGATIVE COCCOBACILLI MODERATE GRAM POSITIVE COCCI IN PAIRS    Culture CULTURE REINCUBATED FOR BETTER GROWTH  Final   Report Status PENDING  Incomplete     Impression/Plan:  1. Abscess - draining.  On zosyn and will continue pending cultures 2. Cellulitis - upper left leg/hip concerning for cellulitis.  I agree with CT scan.  3. Pain - after discussion with the patient, it sounds like he is ready to talk to palliative care since he is constantly in pain, if primary team feels this is appropriate.

## 2016-03-08 ENCOUNTER — Encounter (HOSPITAL_COMMUNITY): Payer: Self-pay | Admitting: Interventional Radiology

## 2016-03-08 ENCOUNTER — Inpatient Hospital Stay (HOSPITAL_COMMUNITY): Payer: Medicaid Other

## 2016-03-08 DIAGNOSIS — Z9359 Other cystostomy status: Secondary | ICD-10-CM

## 2016-03-08 DIAGNOSIS — Z515 Encounter for palliative care: Secondary | ICD-10-CM

## 2016-03-08 DIAGNOSIS — A419 Sepsis, unspecified organism: Secondary | ICD-10-CM

## 2016-03-08 DIAGNOSIS — M60009 Infective myositis, unspecified site: Secondary | ICD-10-CM | POA: Clinically undetermined

## 2016-03-08 DIAGNOSIS — R52 Pain, unspecified: Secondary | ICD-10-CM

## 2016-03-08 DIAGNOSIS — M60052 Infective myositis, left thigh: Secondary | ICD-10-CM

## 2016-03-08 DIAGNOSIS — C649 Malignant neoplasm of unspecified kidney, except renal pelvis: Secondary | ICD-10-CM | POA: Insufficient documentation

## 2016-03-08 HISTORY — PX: IR GENERIC HISTORICAL: IMG1180011

## 2016-03-08 LAB — CBC WITH DIFFERENTIAL/PLATELET
BASOS ABS: 0 10*3/uL (ref 0.0–0.1)
BASOS PCT: 0 %
EOS ABS: 0.4 10*3/uL (ref 0.0–0.7)
Eosinophils Relative: 6 %
HCT: 30.6 % — ABNORMAL LOW (ref 39.0–52.0)
HEMOGLOBIN: 9.9 g/dL — AB (ref 13.0–17.0)
Lymphocytes Relative: 9 %
Lymphs Abs: 0.7 10*3/uL (ref 0.7–4.0)
MCH: 27.6 pg (ref 26.0–34.0)
MCHC: 32.4 g/dL (ref 30.0–36.0)
MCV: 85.2 fL (ref 78.0–100.0)
Monocytes Absolute: 0.7 10*3/uL (ref 0.1–1.0)
Monocytes Relative: 10 %
NEUTROS PCT: 75 %
Neutro Abs: 5.2 10*3/uL (ref 1.7–7.7)
Platelets: 363 10*3/uL (ref 150–400)
RBC: 3.59 MIL/uL — AB (ref 4.22–5.81)
RDW: 19.8 % — ABNORMAL HIGH (ref 11.5–15.5)
WBC: 7 10*3/uL (ref 4.0–10.5)

## 2016-03-08 LAB — BASIC METABOLIC PANEL
Anion gap: 10 (ref 5–15)
BUN: 5 mg/dL — ABNORMAL LOW (ref 6–20)
CHLORIDE: 99 mmol/L — AB (ref 101–111)
CO2: 28 mmol/L (ref 22–32)
CREATININE: 0.81 mg/dL (ref 0.61–1.24)
Calcium: 8 mg/dL — ABNORMAL LOW (ref 8.9–10.3)
Glucose, Bld: 125 mg/dL — ABNORMAL HIGH (ref 65–99)
POTASSIUM: 3.5 mmol/L (ref 3.5–5.1)
SODIUM: 137 mmol/L (ref 135–145)

## 2016-03-08 MED ORDER — LIDOCAINE HCL 1 % IJ SOLN
INTRAMUSCULAR | Status: AC
  Filled 2016-03-08: qty 20

## 2016-03-08 MED ORDER — POTASSIUM CHLORIDE CRYS ER 20 MEQ PO TBCR
40.0000 meq | EXTENDED_RELEASE_TABLET | Freq: Once | ORAL | Status: AC
  Administered 2016-03-08: 40 meq via ORAL
  Filled 2016-03-08: qty 2

## 2016-03-08 MED ORDER — IOPAMIDOL (ISOVUE-300) INJECTION 61%
INTRAVENOUS | Status: AC
  Administered 2016-03-08: 30 mL
  Filled 2016-03-08: qty 50

## 2016-03-08 MED ORDER — HYDROMORPHONE HCL 1 MG/ML IJ SOLN: 2.0000 mg | INTRAMUSCULAR | Status: DC | PRN

## 2016-03-08 MED ORDER — FENTANYL CITRATE (PF) 100 MCG/2ML IJ SOLN
INTRAMUSCULAR | Status: AC | PRN
  Administered 2016-03-08 (×3): 50 ug via INTRAVENOUS

## 2016-03-08 MED ORDER — VANCOMYCIN HCL 10 G IV SOLR
2000.0000 mg | Freq: Once | INTRAVENOUS | Status: AC
  Administered 2016-03-08: 2000 mg via INTRAVENOUS
  Filled 2016-03-08: qty 2000

## 2016-03-08 MED ORDER — FENTANYL CITRATE (PF) 100 MCG/2ML IJ SOLN
INTRAMUSCULAR | Status: AC
  Filled 2016-03-08: qty 6

## 2016-03-08 MED ORDER — HYDROMORPHONE HCL 1 MG/ML IJ SOLN
2.0000 mg | INTRAMUSCULAR | Status: DC | PRN
  Administered 2016-03-08: 2 mg via INTRAVENOUS
  Filled 2016-03-08: qty 2

## 2016-03-08 MED ORDER — SENNOSIDES-DOCUSATE SODIUM 8.6-50 MG PO TABS
1.0000 | ORAL_TABLET | Freq: Two times a day (BID) | ORAL | Status: DC
  Administered 2016-03-08 – 2016-03-16 (×16): 1 via ORAL
  Filled 2016-03-08 (×16): qty 1

## 2016-03-08 MED ORDER — LIDOCAINE HCL 1 % IJ SOLN
INTRAMUSCULAR | Status: AC | PRN
  Administered 2016-03-08: 10 mL

## 2016-03-08 MED ORDER — HYDROMORPHONE HCL 1 MG/ML IJ SOLN
1.0000 mg | INTRAMUSCULAR | Status: DC | PRN
  Administered 2016-03-08 – 2016-03-09 (×3): 2 mg via INTRAVENOUS
  Filled 2016-03-08 (×3): qty 2

## 2016-03-08 MED ORDER — KETOROLAC TROMETHAMINE 30 MG/ML IJ SOLN
30.0000 mg | Freq: Four times a day (QID) | INTRAMUSCULAR | Status: AC | PRN
  Administered 2016-03-08 – 2016-03-09 (×2): 30 mg via INTRAVENOUS
  Filled 2016-03-08 (×2): qty 1

## 2016-03-08 MED ORDER — VANCOMYCIN HCL IN DEXTROSE 1-5 GM/200ML-% IV SOLN
1000.0000 mg | Freq: Three times a day (TID) | INTRAVENOUS | Status: DC
  Administered 2016-03-08 – 2016-03-10 (×5): 1000 mg via INTRAVENOUS
  Filled 2016-03-08 (×7): qty 200

## 2016-03-08 NOTE — Progress Notes (Signed)
Patient visibly uncomfortable. Wince, grimace, and holds breath with minor movements. Patient has beads of sweat on forehead only. Palliative consult is in but have not been able to see patient yet. Repositioning assistance and emotional support provided. MD notified to see if there are any changes that can be made with pain regimen until palliative care is available. Will continue to monitor patient closely.

## 2016-03-08 NOTE — Progress Notes (Signed)
PT Cancellation Note  Patient Details Name: Andrew Kline MRN: [id number] DOB: 02-08-1966   Cancelled Treatment:    Reason Eval/Treat Not Completed: Patient at procedure or test/unavailable   [name] 03/08/2016, 3:14 PM [facility] PT [phone]

## 2016-03-08 NOTE — Progress Notes (Signed)
PROGRESS NOTE    Andrew Kline  IPJ:[id number] DOB: Apr 22, 1966 DOA: 03/04/2016 PCP: [name], MD    Brief Narrative:  Patient is a 50 year old male with history of metastatic rectal cancer status post colostomy, renal cell carcinoma of the right kidney status post partial nephrectomy with bilateral nephrostomy tubes and prior presacral abscesses requiring admission 925-10 1 present with recurrent infection. Patient was seen at [facility] by general surgery who felt no surgical intervention was needed at this time and that patient will require percutaneous drainage via interventional radiology. Patient underwent CT-guided left transgluteal drain placement with fecal fluid aspirated and culture sent of concern for fistula to bladder or bowel. General surgery consulted. ID is also been consulted and patient currently on IV Zosyn. 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 Patient with a very complicated GU system with bilateral nephrostomy tubes, suprapubic catheter, colostomy secondary to his metastatic cancer. Patient also noted to have cellulitis/pyomyositis of the left buttocks and upper thigh per CT scan. Antibiotic coverage has been brought in and IV vancomycin has been added. Consult with urology for further evaluation and management. Palliative care consultation has also been placed for pain management and goals of care.   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 Sepsis protocol was obtained. Chest x-ray done was negative for any acute infiltrate. Patient has been pancultured. CT abdomen and pelvis which was abnormal did show a increasing size of presacral collection now at 6.2 x 5.9 x 9.2 cm newly containing gas. Admitting physician discussed CT findings with Dr. Arnoldo Morale of general surgery at [facility] who had recommended transfer to [facility] for percutaneous drainage of presacral abscess by interventional radiology. Patient was on IV clindamycin however has been changed to IV Zosyn per ID recommendations. Patient is currently afebrile. WBC trending down. Blood cultures pending. Per admitting physician if patient needs eventual I and D on gluteal abscess patient could be transferred back to [facility] and Dr. Arnoldo Morale will be happy to see him however per admitting physician. Dr. Arnoldo Morale of general surgery does not believe any surgical intervention is needed at this time.  Patient status post CT-guided transgluteal pelvic drain placement interventional radiology 03/05/2016 which had 50 mL of exudative, fecal fluid aspirated. Cultures from abscess with moderate gram-positive cocci. His, abundant gram-negative Coco bacilli. 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 Patient has been seen in consultation by general surgery who recommended drainage by IR of any fluid collection, follow up with urology, bleak prognosis. Saline lock IV fluids. Continue IV Zosyn. Patient has been seen in consultation by general surgery who feel fistula more likely communicating with urethra versus bladder. Per general surgery nothing surgical at this point in time to be done. Will consult with urology for further evaluation and management. Broad antibiotic coverage with IV vancomycin due to worsening cellulitis/pyomyositis of gluteus Maximus/left hip and upper  thigh. Will likely need to follow-up with his general surgeon in the outpatient setting. ID following and appreciate input and recommendations.  #2 hyponatremia Likely secondary to hypovolemic hyponatremia. Sodium levels improved with hydration. Saline lock IV fluids. Follow.  #3 history of rectal cancer/renal cell carcinoma status post bilateral nephrostomy tubes. Patient with history of metastatic rectal cancer status post colostomy, renal cell carcinoma of the right kidney status post partial nephrectomy with bilateral nephrostomy tubes. CT abdomen and pelvis with pulmonary metastases and possible liver metastases. Outpatient follow-up with his oncologist.  #4 left upper thigh swelling/pyomyositis left gluteus maximus with cellulitis of left upper thigh and hip. CT hip 03/07/2016 with worsening cellulitis and pyomyositis of gluteus maximus. Patient with significant pain and left buttocks and hip. Percutaneous drain noted. Blood cultures pending. Fluid collection cultures pending. Continue empiric IV Zosyn. Broaden antibiotic coverage with addition of IV vancomycin. ID following and appreciate input and recommendations.   #5 depression Continue Cymbalta.  #6 probable complicated UTI Urine cultures with 70,000 colonies of yeast.    DVT prophylaxis: Lovenox Code Status: Full Family Communication: Updated patient. No family at bedside. Disposition Plan: Home when medically stable and sepsis has resolved.   Consultants:   Interventional radiology Dr. Annamaria Boots 03/05/2016  Infectious diseases Dr. Baxter Flattery 03/05/2016  General surgery: Dr. Barry Dienes 03/06/2016  Wound care  Procedures:   CT abdomen and pelvis 03/04/2016  Chest x-ray 03/04/2016  CT-guided left transgluteal pelvic drain placement with 50 mL of exudative, fecal fluid aspirated per Dr. Annamaria Boots 03/05/2016  CT left hip 03/07/2016  Antimicrobials:   IV Zosyn 03/04/2016 1 dose.  IV vancomycin 03/04/2016 1 dose  IV  clindamycin 03/05/2016>>> 03/05/2016  IV Zosyn 03/06/2016  IV vancomycin 03/08/2016   Subjective: Patient states he is doing the best and he can. Patient still with significant left buttocks and left upper thigh pain. No chest pain. No shortness of breath  Objective: Vitals:   03/07/16 2000 03/08/16 0000 03/08/16 0400 03/08/16 0735  BP: 98/87 (!) 107/46 114/73 124/66  Pulse: 90 92 79 87  Resp: 17   14  Temp:  98.8 F (37.1 C) 98.2 F (36.8 C) 97.5 F (36.4 C)  TempSrc:  Oral Axillary Oral  SpO2: (!) 87% (!) 88% 94% (!) 85%  Weight:      Height:        Intake/Output Summary (Last 24 hours) at 03/08/16 0934 Last data filed at 03/08/16 0600  Gross per 24 hour  Intake              158 ml  Output             2200 ml  Net            -2042 ml   Filed Weights   03/04/16 1544 03/04/16 2228  Weight: 81.6 kg (180 lb) 98.9 kg (218 lb)    Examination:  General exam: Appears calm and comfortable  Respiratory system: Decreased breath sounds in the bases. No wheezing Respiratory effort normal. Cardiovascular system: S1 & S2 heard, RRR. No JVD, murmurs, rubs, gallops or clicks. No pedal edema. Gastrointestinal system: Abdomen is obese, nondistended, soft and tender to palpation in the lower abdominal region. Colostomy bag with stool. Bilateral percutaneous nephrostomy tubes noted. Left gluteal drain with serosanguineous fluid. No organomegaly or masses felt. Normal bowel sounds heard. Central nervous system: Alert and oriented. No focal neurological deficits. Extremities: Left buttocks and upper thigh with some erythema, tightness, significant tenderness to palpation. Drain in place. Symmetric  5 x 5 power. Skin: Worsening erythema on the left buttocks and upper thigh. Psychiatry: Judgement and insight appear normal. Mood & affect appropriate.     Data Reviewed: I have personally reviewed following labs and imaging studies  CBC:  Recent Labs Lab 03/04/16 1616 03/05/16 0122  03/06/16 0121 03/07/16 0319 03/08/16 0224  WBC 18.0* 13.3* 9.7 8.6 7.0  NEUTROABS 15.3*  --  8.4* 7.1 5.2  HGB 13.5 10.8* 10.4* 10.0* 9.9*  HCT 39.8 32.5* 31.8* 30.9* 30.6*  MCV 83.8 83.8 84.8 84.0 85.2  PLT 288 230 293 320 355   Basic Metabolic Panel:  Recent Labs Lab 03/04/16 1616 03/05/16 0122 03/06/16 0121 03/07/16 0319 03/08/16 0224  NA 124* 133* 134* 134* 137  K 3.4* 3.6 3.6 3.3* 3.5  CL 91* 104 100* 100* 99*  CO2 21* _0 GLUCOSE 166* 145* 141* 108* 125*  BUN _1 5* 5*  CREATININE 0.98 0.91 0.86 0.75 0.81  CALCIUM 8.1* 7.1* 7.8* 7.8* 8.0*  MG  --   --  1.8  --   --    GFR: Estimated Creatinine Clearance: 115.4 mL/min (by C-G formula based on SCr of 0.81 mg/dL). Liver Function Tests:  Recent Labs Lab 03/04/16 1616 03/06/16 0121  AST 40 22  ALT 33 22  ALKPHOS 154* 111  BILITOT 1.3* 0.5  PROT 7.2 5.4*  ALBUMIN 3.1* 1.9*   No results for input(s): LIPASE, AMYLASE in the last 168 hours. No results for input(s): AMMONIA in the last 168 hours. Coagulation Profile:  Recent Labs Lab 03/04/16 2313  INR 1.33   Cardiac Enzymes: No results for input(s): CKTOTAL, CKMB, CKMBINDEX, TROPONINI in the last 168 hours. BNP (last 3 results) No results for input(s): PROBNP in the last 8760 hours. HbA1C: No results for input(s): HGBA1C in the last 72 hours. CBG: No results for input(s): GLUCAP in the last 168 hours. Lipid Profile: No results for input(s): CHOL, HDL, LDLCALC, TRIG, CHOLHDL, LDLDIRECT in the last 72 hours. Thyroid Function Tests: No results for input(s): TSH, T4TOTAL, FREET4, T3FREE, THYROIDAB in the last 72 hours. Anemia Panel: No results for input(s): VITAMINB12, FOLATE, FERRITIN, TIBC, IRON, RETICCTPCT in the last 72 hours. Sepsis Labs:  Recent Labs Lab 03/04/16 1614 03/04/16 1930 03/04/16 2313 03/05/16 0107  PROCALCITON  --   --  5.85  --   LATICACIDVEN 3.40* 1.11 1.9 1.2    Recent Results (from the past 240 hour(s))    Culture, blood (Routine x 2)     Status: None (Preliminary result)   Collection Time: 03/04/16  4:16 PM  Result Value Ref Range Status   Specimen Description BLOOD RIGHT HAND  Final   Special Requests BOTTLES DRAWN AEROBIC AND ANAEROBIC 8CC EACH  Final   Culture NO GROWTH 3 DAYS  Final   Report Status PENDING  Incomplete  Culture, blood (Routine x 2)     Status: None (Preliminary result)   Collection Time: 03/04/16  4:17 PM  Result Value Ref Range Status   Specimen Description LEFT ANTECUBITAL  Final   Special Requests BOTTLES DRAWN AEROBIC AND ANAEROBIC 6CC EACH  Final   Culture NO GROWTH 3 DAYS  Final   Report Status PENDING  Incomplete  Urine culture     Status: Abnormal   Collection Time: 03/04/16  4:37 PM  Result Value Ref Range Status   Specimen Description URINE, CLEAN CATCH  Final   Special Requests NONE  Final   Culture 70,000 COLONIES/mL YEAST (  A)  Final   Report Status 03/06/2016 FINAL  Final  MRSA PCR Screening     Status: None   Collection Time: 03/04/16 10:37 PM  Result Value Ref Range Status   MRSA by PCR NEGATIVE NEGATIVE Final    Comment:        The GeneXpert MRSA Assay (FDA approved for NASAL specimens only), is one component of a comprehensive MRSA colonization surveillance program. It is not intended to diagnose MRSA infection nor to guide or monitor treatment for MRSA infections.   Culture, blood (x 2)     Status: None (Preliminary result)   Collection Time: 03/04/16 11:13 PM  Result Value Ref Range Status   Specimen Description BLOOD RIGHT ARM  Final   Special Requests BOTTLES DRAWN AEROBIC AND ANAEROBIC 10ML  Final   Culture NO GROWTH 2 DAYS  Final   Report Status PENDING  Incomplete  Culture, blood (x 2)     Status: None (Preliminary result)   Collection Time: 03/04/16 11:20 PM  Result Value Ref Range Status   Specimen Description BLOOD RIGHT HAND  Final   Special Requests AEROBIC BOTTLE ONLY 6ML  Final   Culture NO GROWTH 2 DAYS  Final    Report Status PENDING  Incomplete  Aerobic/Anaerobic Culture (surgical/deep wound)     Status: None (Preliminary result)   Collection Time: 03/05/16 12:57 PM  Result Value Ref Range Status   Specimen Description ABSCESS ABDOMEN  Final   Special Requests Normal  Final   Gram Stain   Final    FEW WBC PRESENT, PREDOMINANTLY PMN ABUNDANT GRAM NEGATIVE COCCOBACILLI MODERATE GRAM POSITIVE COCCI IN PAIRS    Culture   Final    CULTURE REINCUBATED FOR BETTER GROWTH HOLDING FOR POSSIBLE ANAEROBE    Report Status PENDING  Incomplete         Radiology Studies: Ct Hip Left W Contrast  Result Date: 03/07/2016 CLINICAL DATA:  Patient with history renal cell and rectal carcinoma. Left hip pain and swelling. EXAM: CT OF THE LEFT HIP WITH CONTRAST TECHNIQUE: Multidetector CT imaging was performed following the standard protocol during bolus administration of intravenous contrast. CONTRAST:  100 ml ISOVUE-300 IOPAMIDOL (ISOVUE-300) INJECTION 61% COMPARISON:  CT abdomen and pelvis 03/04/2016. FINDINGS: Presacral abscess with a drainage catheter in place is partially imaged. Stranding in subcutaneous fatty tissues about the left buttock and hip has worsened since the prior CT scan. The left gluteus maximus muscle appears edematous. The muscles only partially imaged but no focal fluid collection is seen within the muscle. There are a few tiny locules of gas within the muscle. The left hip is located. No fracture or focal bone lesion is identified. No hip joint effusion is seen. Limited visualization of the symphysis pubis and left SI joint are unremarkable. IMPRESSION: Increased stranding in subcutaneous fat about the left buttock and hip consistent with worsened cellulitis. The left gluteal musculature is incompletely imaged but there are a few tiny locules of gas within the gluteus maximus compatible with pyomyositis. No intramuscular abscess is visualized on this examination. Presacral abscess with a drainage  catheter in place. Electronically Signed   By: Inge Rise M.D.   On: 03/07/2016 20:26        Scheduled Meds: . amitriptyline  50 mg Oral QHS  . DULoxetine  60 mg Oral Daily  . enoxaparin (LOVENOX) injection  40 mg Subcutaneous Q24H  . fentaNYL  150 mcg Transdermal Q72H  . piperacillin-tazobactam (ZOSYN)  IV  3.375 g Intravenous  Q8H  . pregabalin  150 mg Oral BID  . senna-docusate  1 tablet Oral QHS  . sodium chloride flush  3 mL Intravenous Q12H   Continuous Infusions:    LOS: 4 days    Time spent: 35 minutes    Tiona Ruane, MD [facility] Pager [phone]  If 7PM-7AM, please contact night-coverage [email or web address] Password [facility] 03/08/2016, 9:34 AM

## 2016-03-08 NOTE — Progress Notes (Signed)
Referring Physician(s): Dr Alan Ripper Dr Cline Cools  Supervising Physician: Daryll Brod  Patient Status:  [facility] - In-pt  Chief Complaint:  Bilateral percutaneous nephrostomy tubes Placed 01/27/2016  Subjective:  Rectal Ca Renal Ca Urinary leakage and B PCNs placed in IR 01/27/2016---for diversion purposes Supra pubic cath placed previously (08/2015) after urinary bladder injury secondary radiation for rectal ca  Request now for PCN exchanges Exquisitely tender at PCN sites (will need sedation for exchange)  Pt sees and follows Dr Diona Fanti  Urology.   Allergies: Oxaliplatin  Medications: Prior to Admission medications   Medication Sig Start Date End Date Taking? Authorizing Provider  albuterol (PROVENTIL HFA;VENTOLIN HFA) 108 (90 Base) MCG/ACT inhaler Inhale 2 puffs into the lungs every 6 (six) hours as needed for wheezing or shortness of breath. 05/26/15  Yes Patrici Ranks, MD  amitriptyline (ELAVIL) 50 MG tablet Take 1 tablet (50 mg total) by mouth at bedtime. 12/31/15  Yes Patrici Ranks, MD  docusate sodium (COLACE) 100 MG capsule Take 1 capsule (100 mg total) by mouth every 12 (twelve) hours. Patient taking differently: Take 100 mg by mouth daily as needed for mild constipation or moderate constipation.  09/25/14  Yes Christopher Lawyer, PA-C  DULoxetine (CYMBALTA) 60 MG capsule Take 1 capsule (60 mg total) by mouth daily. 12/31/15  Yes Patrici Ranks, MD  fentaNYL (DURAGESIC - DOSED MCG/HR) 75 MCG/HR Place 2 patches (150 mcg total) onto the skin every 3 (three) days. 02/01/16  Yes Manon Hilding Kefalas, PA-C  HYDROmorphone (DILAUDID) 4 MG tablet Take 1 tablet (4 mg total) by mouth every 4 (four) hours as needed for severe pain. 02/22/16  Yes Manon Hilding Kefalas, PA-C  pregabalin (LYRICA) 75 MG capsule Take 2 capsules (150 mg total) by mouth 2 (two) times daily. 12/31/15  Yes Patrici Ranks, MD  zolpidem (AMBIEN CR) 12.5 MG CR tablet Take 1 tablet (12.5 mg total) by  mouth at bedtime as needed for sleep. 12/31/15  Yes Patrici Ranks, MD     Vital Signs: BP 124/66   Pulse 87   Temp 97.5 F (36.4 C) (Oral)   Resp 14   Ht '5\' 5"'$  (1.651 m)   Wt 218 lb (98.9 kg) Comment: Weight taken by RN  SpO2 (!) 85%   BMI 36.28 kg/m   Physical Exam  Cardiovascular: Normal rate and regular rhythm.   Pulmonary/Chest: Effort normal and breath sounds normal.  Abdominal: Soft. Bowel sounds are normal.  Neurological: He is alert.  Skin: Skin is warm.  Sites of PCNs tender; none infected  Psychiatric: He has a normal mood and affect. His behavior is normal. Judgment and thought content normal.  Nursing note and vitals reviewed.   Imaging: Ct Hip Left W Contrast  Result Date: 03/07/2016 CLINICAL DATA:  Patient with history renal cell and rectal carcinoma. Left hip pain and swelling. EXAM: CT OF THE LEFT HIP WITH CONTRAST TECHNIQUE: Multidetector CT imaging was performed following the standard protocol during bolus administration of intravenous contrast. CONTRAST:  100 ml ISOVUE-300 IOPAMIDOL (ISOVUE-300) INJECTION 61% COMPARISON:  CT abdomen and pelvis 03/04/2016. FINDINGS: Presacral abscess with a drainage catheter in place is partially imaged. Stranding in subcutaneous fatty tissues about the left buttock and hip has worsened since the prior CT scan. The left gluteus maximus muscle appears edematous. The muscles only partially imaged but no focal fluid collection is seen within the muscle. There are a few tiny locules of gas within the muscle. The left  hip is located. No fracture or focal bone lesion is identified. No hip joint effusion is seen. Limited visualization of the symphysis pubis and left SI joint are unremarkable. IMPRESSION: Increased stranding in subcutaneous fat about the left buttock and hip consistent with worsened cellulitis. The left gluteal musculature is incompletely imaged but there are a few tiny locules of gas within the gluteus maximus compatible  with pyomyositis. No intramuscular abscess is visualized on this examination. Presacral abscess with a drainage catheter in place. Electronically Signed   By: Inge Rise M.D.   On: 03/07/2016 20:26   Ct Abdomen Pelvis W Contrast  Result Date: 03/04/2016 CLINICAL DATA:  Fever, low back pain, LEFT foot pain, shortness of breath, tachycardia, generalized abdominal pain, history renal cell carcinoma, rectal cancer, kidney stones, former smoker EXAM: CT ABDOMEN AND PELVIS WITH CONTRAST TECHNIQUE: Multidetector CT imaging of the abdomen and pelvis was performed using the standard protocol following bolus administration of intravenous contrast. Sagittal and coronal MPR images reconstructed from axial data set. CONTRAST:  160m ISOVUE-300 IOPAMIDOL (ISOVUE-300) INJECTION 61% COMPARISON:  01/26/2016, 01/12/2016 FINDINGS: Lower chest: Bibasilar pulmonary metastases again identified. 2.1 x 1.7 cm diameter RIGHT lower lobe metastasis image 7 has cavitated centrally since previous exam. LEFT lung base metastasis 17 x 13 mm image 15 appeared larger on the previous exam at approximately 17 x 18 mm. Other nodules appear grossly stable Hepatobiliary: Mild diffuse fatty infiltration of liver. Hypervascular focus lateral segment LEFT lobe liver 11 mm diameter image 19 Pancreas: Chronic calcific pancreatitis changes. No definite pancreatic mass or fluid collection. Spleen: Normal appearance Adrenals/Urinary Tract: Adrenal glands normal appearance. BILATERAL nephrostomy tubes without renal mass or hydronephrosis. Normal caliber ureters and bladder. Suprapubic catheter in bladder. Mild diffuse bladder wall thickening which could reflect chronic infection, chronic outlet obstruction, or prior radiation therapy. Stomach/Bowel: Normal appendix. LEFT lower quadrant colostomy with parastomal herniation of nonobstructed small bowel loops. Scattered stool throughout colon. Post perineal proctectomy per chart. Large gas and fluid  collection and presacral space, with thickened wall overall measuring 6.2 x 5.9 x 9.2 cm. Collection appears larger and the presence of air within the collection is new since the previous exam. Vascular/Lymphatic: Atherosclerotic calcifications aorta without aneurysm. Reproductive: N/A Other: No free air or free fluid. Musculoskeletal: Infiltrative changes and enlargement of the LEFT gluteus maximus, containing probable fluid and multiple foci of gas consistent with infection, potentially by gas-forming organism versus extension of the presacral space process. No acute osseous findings. IMPRESSION: Increase in size of a presacral collection now 6.2 x 5.9 x 9.2 cm, newly containing gas, question abscess. New enlargement and gas within the LEFT gluteus maximus likely representing infection, question by a gas-forming organism. Interval resolution of BILATERAL hydronephrosis post nephrostomy placement. 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 Pulmonary metastases, with a LEFT lower lobe lesion appearing slightly smaller and a RIGHT lower lead lesion now cavitated. Bladder wall thickening question due to chronic infection, chronic outlet obstruction, or prior pelvic irradiation. Chronic calcific pancreatitis. 11 mm hypervascular focus lateral segment LEFT lobe liver cannot exclude hypervascular metastasis. Findings called to Dr. ZRoderic Palauon 03/04/2016 at 1800 hours. Electronically Signed   By: MLavonia DanaM.D.   On: 03/04/2016 18:01   Dg Chest Port 1 View  Result Date: 03/04/2016 CLINICAL DATA:  Several days of fever, tachycardia, back pain, and shortness of breath. Patient recently hospitalized for severe kidney infection. EXAM: PORTABLE CHEST 1 VIEW COMPARISON:  Portable  chest x-ray of January 25, 2016 FINDINGS: The lungs are adequately inflated. The interstitial markings are prominent especially on the right. The heart is mildly enlarged.  The central pulmonary vascularity is prominent. The mediastinum is normal in width. There is no pleural effusion. The power port catheter tip projects over the midportion of the SVC. The bony thorax is unremarkable. IMPRESSION: Mild interstitial prominence greatest on the left not as conspicuous as on the previous study. This likely reflects low-grade pulmonary edema. There is no alveolar pneumonia nor pleural effusion. When the patient can tolerate the procedure, a PA and lateral chest x-ray would be useful. Electronically Signed   By: David  Martinique M.D.   On: 03/04/2016 16:40   Ct Image Guided Drainage By Percutaneous Catheter  Result Date: 03/05/2016 INDICATION: Fever, elevated white count, recurrent presacral pelvic abscess. 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 EXAM: CT GUIDED DRAINAGE OF RECURRENT PRESACRAL PELVIC ABSCESS MEDICATIONS: The patient is currently admitted to the hospital and receiving intravenous antibiotics. The antibiotics were administered within an appropriate time frame prior to the initiation of the procedure. ANESTHESIA/SEDATION: 2.0 mg IV Versed 200 mcg IV Fentanyl Moderate Sedation Time:  14 MINUTES The patient was continuously monitored during the procedure by the interventional radiology nurse under my direct supervision. COMPLICATIONS: None immediate. TECHNIQUE: Informed written consent was obtained from the patient after a thorough discussion of the procedural risks, benefits and alternatives. All questions were addressed. Maximal Sterile Barrier Technique was utilized including caps, mask, sterile gowns, sterile gloves, sterile drape, hand hygiene and skin antiseptic. A timeout was performed prior to the initiation of the procedure. PROCEDURE: Previous imaging reviewed. Patient positioned prone. Noncontrast localization CT performed. The presacral pelvic abscess was localized. Abscess cavity now containing dilute contrast, suspect from fistula the bladder or bowel The left gluteal  region was prepped with ChloraPrep in a sterile fashion, and a sterile drape was applied covering the operative field. A sterile gown and sterile gloves were used for the procedure. Local anesthesia was provided with 1% Lidocaine. Under sterile conditions and local anesthesia, an 18 gauge 10 cm access needle was advanced from a left trans gluteal approach into the fluid collection. Needle position confirmed. Syringe aspiration yielded fecal contaminated fluid. Guidewire inserted followed by tract dilatation to advance a 10 [place] drain. Drain catheter position confirmed with CT. Syringe aspiration yielded 50 cc purulent fecal contaminated fluid. Catheter secured with a Prolene suture and connected to external gravity drainage bag. Sterile dressing applied. No immediate complication. Patient tolerated the procedure well. FINDINGS: CT imaging confirms needle access of the presacral abscess for drain insertion. IMPRESSION: Successful CT-guided left trans gluteal presacral abscess drain insertion. Dilute contrast within the abscess cavity from the recent CT, suspicious for fistula from either the bowel or bladder. Electronically Signed   By: Jerilynn Mages.  Shick M.D.   On: 03/05/2016 13:06    Labs:  CBC:  Recent Labs  03/05/16 0122 03/06/16 0121 03/07/16 0319 03/08/16 0224  WBC 13.3* 9.7 8.6 7.0  HGB 10.8* 10.4* 10.0* 9.9*  HCT 32.5* 31.8* 30.9* 30.6*  PLT 230 293 320 363    COAGS:  Recent Labs  09/23/15 0900 11/12/15 0945 01/26/16 1540 03/04/16 2313  INR 1.15 1.12 1.00 1.33  APTT 38*  --   --  41*    BMP:  Recent Labs  03/05/16 0122 03/06/16 0121 03/07/16 0319 03/08/16 0224  NA 133* 134* 134* 137  K 3.6 3.6 3.3* 3.5  CL 104 100* 100* 99*  CO2 22  $'26 27 28  'H$ GLUCOSE 145* 141* 108* 125*  BUN 8 10 5* 5*  CALCIUM 7.1* 7.8* 7.8* 8.0*  CREATININE 0.91 0.86 0.75 0.81  GFRNONAA >60 >60 >60 >60  GFRAA >60 >60 >60 >60    LIVER FUNCTION TESTS:  Recent Labs  02/09/16 0854 02/23/16 0915  03/04/16 1616 03/06/16 0121  BILITOT 0.2* 0.3 1.3* 0.5  AST 18 18 40 22  ALT 13* 17 33 22  ALKPHOS 99 87 154* 111  PROT 7.4 5.8* 7.2 5.4*  ALBUMIN 3.5 2.9* 3.1* 1.9*    Assessment and Plan:  B PCN exchange Placed 01/27/16  Electronically Signed: Monia Sabal A 03/08/2016, 11:11 AM   I spent a total of 15 Minutes at the the patient's bedside AND on the patient's hospital floor or unit, greater than 50% of which was counseling/coordinating care for B PCN exchange

## 2016-03-08 NOTE — Progress Notes (Signed)
OT Cancellation Note  Patient Details Name: Andrew Kline MRN: [id number] DOB: 1966-02-01   Cancelled Treatment:    Reason Eval/Treat Not Completed: Patient at procedure or test/ unavailable.  [name] 03/08/2016, 1:57 PM  [phone]

## 2016-03-08 NOTE — Progress Notes (Addendum)
[place] for Infectious Disease   Reason for visit: Follow up on gluteal abscess, pyomyositis  Interval History: drains being replaced Day 4 pip/tazo, vancomycin day 1   Physical Exam: Constitutional:  Vitals:   03/08/16 0735 03/08/16 1206  BP: 124/66 111/60  Pulse: 87 83  Resp: 14 20  Temp: 97.5 F (36.4 C) 98.2 F (36.8 C)  Patient not available, in procedure  Review of Systems: Constitutional: negative for fevers and chills Gastrointestinal: negative for diarrhea Musculoskeletal: negative for muscle weakness  Lab Results  Component Value Date   WBC 7.0 03/08/2016   HGB 9.9 (L) 03/08/2016   HCT 30.6 (L) 03/08/2016   MCV 85.2 03/08/2016   PLT 363 03/08/2016    Lab Results  Component Value Date   CREATININE 0.81 03/08/2016   BUN 5 (L) 03/08/2016   NA 137 03/08/2016   K 3.5 03/08/2016   CL 99 (L) 03/08/2016   CO2 28 03/08/2016    Lab Results  Component Value Date   ALT 22 03/06/2016   AST 22 03/06/2016   ALKPHOS 111 03/06/2016     Microbiology: Recent Results (from the past 240 hour(s))  Culture, blood (Routine x 2)     Status: None (Preliminary result)   Collection Time: 03/04/16  4:16 PM  Result Value Ref Range Status   Specimen Description BLOOD RIGHT HAND  Final   Special Requests BOTTLES DRAWN AEROBIC AND ANAEROBIC 8CC EACH  Final   Culture NO GROWTH 4 DAYS  Final   Report Status PENDING  Incomplete  Culture, blood (Routine x 2)     Status: None (Preliminary result)   Collection Time: 03/04/16  4:17 PM  Result Value Ref Range Status   Specimen Description LEFT ANTECUBITAL  Final   Special Requests BOTTLES DRAWN AEROBIC AND ANAEROBIC 6CC EACH  Final   Culture NO GROWTH 4 DAYS  Final   Report Status PENDING  Incomplete  Urine culture     Status: Abnormal   Collection Time: 03/04/16  4:37 PM  Result Value Ref Range Status   Specimen Description URINE, CLEAN CATCH  Final   Special Requests NONE  Final   Culture 70,000 COLONIES/mL YEAST  (A)  Final   Report Status 03/06/2016 FINAL  Final  MRSA PCR Screening     Status: None   Collection Time: 03/04/16 10:37 PM  Result Value Ref Range Status   MRSA by PCR NEGATIVE NEGATIVE Final    Comment:        The GeneXpert MRSA Assay (FDA approved for NASAL specimens only), is one component of a comprehensive MRSA colonization surveillance program. It is not intended to diagnose MRSA infection nor to guide or monitor treatment for MRSA infections.   Culture, blood (x 2)     Status: None (Preliminary result)   Collection Time: 03/04/16 11:13 PM  Result Value Ref Range Status   Specimen Description BLOOD RIGHT ARM  Final   Special Requests BOTTLES DRAWN AEROBIC AND ANAEROBIC 10ML  Final   Culture NO GROWTH 2 DAYS  Final   Report Status PENDING  Incomplete  Culture, blood (x 2)     Status: None (Preliminary result)   Collection Time: 03/04/16 11:20 PM  Result Value Ref Range Status   Specimen Description BLOOD RIGHT HAND  Final   Special Requests AEROBIC BOTTLE ONLY 6ML  Final   Culture NO GROWTH 2 DAYS  Final   Report Status PENDING  Incomplete  Aerobic/Anaerobic Culture (surgical/deep wound)  Status: None (Preliminary result)   Collection Time: 03/05/16 12:57 PM  Result Value Ref Range Status   Specimen Description ABSCESS ABDOMEN  Final   Special Requests Normal  Final   Gram Stain   Final    FEW WBC PRESENT, PREDOMINANTLY PMN ABUNDANT GRAM NEGATIVE COCCOBACILLI MODERATE GRAM POSITIVE COCCI IN PAIRS    Culture   Final    CULTURE REINCUBATED FOR BETTER GROWTH HOLDING FOR POSSIBLE ANAEROBE    Report Status PENDING  Incomplete    Impression/Plan:  1. Abscess - draining.  On zosyn and I agree with vancomycin for CT hip findings of abscesses.  2. Pain - palliative care consulted

## 2016-03-08 NOTE — Sedation Documentation (Signed)
Pt tolerated procedure well. Fentanyl, only, given.

## 2016-03-08 NOTE — Progress Notes (Signed)
Subjective: Pt has left hip pain CT reviewed of left hip cellulitis small dot of air buy drain into pelvis through this site  All drains draining what looks like urine   Objective: Vital signs in last 24 hours: Temp:  [97.5 F (36.4 C)-99.2 F (37.3 C)] 97.5 F (36.4 C) (11/07 0735) Pulse Rate:  [79-93] 87 (11/07 0735) Resp:  [14-17] 14 (11/07 0735) BP: (98-126)/(46-87) 124/66 (11/07 0735) SpO2:  [85 %-94 %] 85 % (11/07 0735) Last BM Date: 03/06/16  Intake/Output from previous day: 11/06 0701 - 11/07 0700 In: 233 [I.V.:78; IV Piggyback:150] Out: 2200 [Urine:1950; Drains:250] Intake/Output this shift: No intake/output data recorded.  Incision/Wound:left hip cellulitis noted drain site  Located   Here  No abscess or fluctuance no SQ air Abdomen  Non acute   Lab Results:   Recent Labs  03/07/16 0319 03/08/16 0224  WBC 8.6 7.0  HGB 10.0* 9.9*  HCT 30.9* 30.6*  PLT 320 363   BMET  Recent Labs  03/07/16 0319 03/08/16 0224  NA 134* 137  K 3.3* 3.5  CL 100* 99*  CO2 27 28  GLUCOSE 108* 125*  BUN 5* 5*  CREATININE 0.75 0.81  CALCIUM 7.8* 8.0*   PT/INR No results for input(s): LABPROT, INR in the last 72 hours. ABG No results for input(s): PHART, HCO3 in the last 72 hours.  Invalid input(s): PCO2, PO2  Studies/Results: Ct Hip Left W Contrast  Result Date: 03/07/2016 CLINICAL DATA:  Patient with history renal cell and rectal carcinoma. Left hip pain and swelling. EXAM: CT OF THE LEFT HIP WITH CONTRAST TECHNIQUE: Multidetector CT imaging was performed following the standard protocol during bolus administration of intravenous contrast. CONTRAST:  100 ml ISOVUE-300 IOPAMIDOL (ISOVUE-300) INJECTION 61% COMPARISON:  CT abdomen and pelvis 03/04/2016. FINDINGS: Presacral abscess with a drainage catheter in place is partially imaged. Stranding in subcutaneous fatty tissues about the left buttock and hip has worsened since the prior CT scan. The left gluteus maximus  muscle appears edematous. The muscles only partially imaged but no focal fluid collection is seen within the muscle. There are a few tiny locules of gas within the muscle. The left hip is located. No fracture or focal bone lesion is identified. No hip joint effusion is seen. Limited visualization of the symphysis pubis and left SI joint are unremarkable. IMPRESSION: Increased stranding in subcutaneous fat about the left buttock and hip consistent with worsened cellulitis. The left gluteal musculature is incompletely imaged but there are a few tiny locules of gas within the gluteus maximus compatible with pyomyositis. No intramuscular abscess is visualized on this examination. Presacral abscess with a drainage catheter in place. Electronically Signed   By: Inge Rise M.D.   On: 03/07/2016 20:26    Anti-infectives: Anti-infectives    Start     Dose/Rate Route Frequency Ordered Stop   03/06/16 0815  piperacillin-tazobactam (ZOSYN) IVPB 3.375 g     3.375 g 12.5 mL/hr over 240 Minutes Intravenous Every 8 hours 03/06/16 0802     03/05/16 2300  clindamycin (CLEOCIN) IVPB 600 mg  Status:  Discontinued     600 mg 100 mL/hr over 30 Minutes Intravenous Every 8 hours 03/04/16 2236 03/05/16 1309   03/04/16 1615  piperacillin-tazobactam (ZOSYN) IVPB 3.375 g     3.375 g 100 mL/hr over 30 Minutes Intravenous  Once 03/04/16 1603 03/04/16 1641   03/04/16 1615  vancomycin (VANCOCIN) IVPB 1000 mg/200 mL premix     1,000 mg 200 mL/hr over 60  Minutes Intravenous  Once 03/04/16 1603 03/04/16 1735      Assessment/Plan: Patient Active Problem List   Diagnosis Date Noted  . Pyomyositis: Left gluteus maximus per CT 03/07/2016 03/08/2016  . Fever   . Abscess   . Sepsis ([place]) 03/04/2016  . Renal cell carcinoma ([place]) 03/04/2016  . Hyponatremia 03/04/2016  . Complicated UTI (urinary tract infection) 01/25/2016  . Pelvic fluid collection 11/06/2015  . Physical deconditioning 07/07/2015  . Muscular  deconditioning 07/07/2015  . Malnutrition of moderate degree ([place]) 02/13/2015  . Rectal carcinoma ([place]) 02/11/2015  . 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 Rectal pain 09/26/2014  . Multiple lung nodules on CT 09/26/2014    Recommend removing left gluteal drain and placing it in a  different site ID recs for ABX coverage Does not need surgical debridement of this but could if this does not respond to ABX and catheter move All of his drains are draining urine  Urology has seen and managed him He has no feculent /succus drainage and is diverted with no rectum left Beyond the scope of practice for [facility]  To manage urine fistula / drains  May need tertiary care in his case since this is unlikely to heal given radiation to pelvis, metastatic carcinoma and poor overall functional status  Discussed with IR and hospitalist service.    LOS: 4 days    Raahi Korber A. 03/08/2016

## 2016-03-08 NOTE — Progress Notes (Signed)
Per IR, no need for patient to remain NPO post procedure. Will change diet order accordingly.

## 2016-03-08 NOTE — Progress Notes (Signed)
Pharmacy Antibiotic Note Andrew Kline is a 50 y.o. male admitted on 03/04/2016 with presacral abscess, s/p TG drain placement on 11/4 in setting of metastatic rectal cancer. Patient has been on Zosyn since 11/3 and pharmacy asked to add vancomycin today.   Plan: 1. Vancomycin 1000 IV every 8 hours.  Goal trough 15-20 mcg/mL. 2. Zosyn 3.375g IV q8h (4 hour infusion).  3. SCr every 72 hours while on vancomycin    Height: '5\' 5"'$  (165.1 cm) Weight: 218 lb (98.9 kg) (Weight taken by RN) IBW/kg (Calculated) : 61.5  Temp (24hrs), Avg:98.4 F (36.9 C), Min:97.5 F (36.4 C), Max:99.2 F (37.3 C)   Recent Labs Lab 03/04/16 1614 03/04/16 1616 03/04/16 1930 03/04/16 2313 03/05/16 0107 03/05/16 0122 03/06/16 0121 03/07/16 0319 03/08/16 0224  WBC  --  18.0*  --   --   --  13.3* 9.7 8.6 7.0  CREATININE  --  0.98  --   --   --  0.91 0.86 0.75 0.81  LATICACIDVEN 3.40*  --  1.11 1.9 1.2  --   --   --   --     Estimated Creatinine Clearance: 115.4 mL/min (by C-G formula based on SCr of 0.81 mg/dL).    Allergies  Allergen Reactions  . Oxaliplatin Itching    Antimicrobials this admission:   11/3 Zosyn >>  11/7 vancomycin >>   Dose adjustments this admission:  N/a  Microbiology results:  11/3 BCx: ngtd 11/3 UCx: yeast   11/3 abscess: px   Thank you for allowing pharmacy to be a part of this patient's care.  Vincenza Hews, PharmD, BCPS 03/08/2016, 10:06 AM Pager: [phone]

## 2016-03-08 NOTE — Sedation Documentation (Signed)
Vital signs stable. No complaints from pt at this time.  

## 2016-03-08 NOTE — Consult Note (Signed)
Reason for Consult: Complex Prostatic Fistula   Referring Physician: Irine Seal MD  Andrew Kline is an 50 y.o. male.   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 He is s/p maximal urianry diversion with bilateral neph tubes and SP tube in effort to divert urine away. Most recent tube change 01/2016, then now today.  CT this admission with persistant full bladder and fistula prior to neph tueb change.   2 - Renal Cell Carcinoma - s/p partial nephrectomy previously. Imaging 03/2016 w/o recurrence.   3 - Nephrolithiasis - history of prior renal stones, s/p ureteroscopy previuosly. Most recent imaging stone free 03/2016.   Today "Andrew Kline" is seen in consultation for above. He remains full code and committed to maximally aggressive care in all aspects.   Past Medical History:  Diagnosis Date  . Anxiety   . Arthritis   . Depression   . Kidney stone 03/22/15   left  . Rectal cancer ([place])   . Rectal pain   . Renal cell cancer ([place])    2012.   [name] Shortness of breath dyspnea     Past Surgical History:  Procedure Laterality Date  . BIOPSY N/A 09/30/2014   Procedure: BIOPSY;  Surgeon: Danie Binder, MD;  Location: [facility];  Service: Endoscopy;  Laterality: N/A;  Anal Canal  . COLOSTOMY N/A 02/11/2015   Procedure: COLOSTOMY;  Surgeon: Aviva Signs, MD;  Location: [facility];  Service: General;  Laterality: N/A;  . FLEXIBLE SIGMOIDOSCOPY N/A 09/30/2014   Procedure: FLEXIBLE SIGMOIDOSCOPY;  Surgeon: Danie Binder, MD;  Location: [facility];  Service: Endoscopy;  Laterality: N/A;  . FLEXIBLE SIGMOIDOSCOPY N/A 10/17/2014   Procedure: FLEXIBLE SIGMOIDOSCOPY;  Surgeon: Danie Binder, MD;  Location: [facility];  Service: Endoscopy;  Laterality: N/A;  1325  . HYPOSPADIAS CORRECTION    . IR GENERIC HISTORICAL  12/17/2015   IR CATHETER TUBE CHANGE 12/17/2015 [facility]  .  IR GENERIC HISTORICAL  01/27/2016   IR NEPHROSTOMY PLACEMENT RIGHT 01/27/2016 [facility]  . IR GENERIC HISTORICAL  01/27/2016   IR NEPHROSTOMY PLACEMENT LEFT 01/27/2016 [facility]  . KNEE SURGERY Left    arthroscopy  . LAPAROSCOPIC PARTIAL NEPHRECTOMY  2012   right side  . LUNG BIOPSY Right   . PERINEAL PROCTECTOMY N/A 08/26/2015   Procedure:  PROCTECTOMY;  Surgeon: Aviva Signs, MD;  Location: [facility];  Service: General;  Laterality: N/A;  . RECTAL SURGERY      Family History  Problem Relation Age of Onset  . Healthy Mother   . Lung cancer Maternal Grandfather   . Colon cancer Maternal Uncle     age 64  . Diabetes Other     Social History:  reports that he has been smoking Cigarettes.  He has a 15.00 pack-year smoking history. He has never used smokeless tobacco. He reports that he drinks alcohol. He reports that he does not use drugs.  Allergies:  Allergies  Allergen Reactions  . Oxaliplatin Itching    Medications: I have reviewed the patient's current medications.  Results for orders placed or performed during the hospital encounter of 03/04/16 (from the past 48 hour(s))  CBC with Differential/Platelet     Status: Abnormal   Collection Time: 03/07/16  3:19 AM  Result Value Ref Range   WBC 8.6 4.0 - 10.5 K/uL   RBC 3.68 (L)  4.22 - 5.81 MIL/uL   Hemoglobin 10.0 (L) 13.0 - 17.0 g/dL   HCT 30.9 (L) 39.0 - 52.0 %   MCV 84.0 78.0 - 100.0 fL   MCH 27.2 26.0 - 34.0 pg   MCHC 32.4 30.0 - 36.0 g/dL   RDW 19.1 (H) 11.5 - 15.5 %   Platelets 320 150 - 400 K/uL   Neutrophils Relative % 82 %   Neutro Abs 7.1 1.7 - 7.7 K/uL   Lymphocytes Relative 7 %   Lymphs Abs 0.6 (L) 0.7 - 4.0 K/uL   Monocytes Relative 7 %   Monocytes Absolute 0.6 0.1 - 1.0 K/uL   Eosinophils Relative 4 %   Eosinophils Absolute 0.4 0.0 - 0.7 K/uL   Basophils Relative 0 %   Basophils Absolute 0.0 0.0 - 0.1 K/uL  Basic metabolic panel     Status: Abnormal   Collection Time: 03/07/16  3:19 AM  Result  Value Ref Range   Sodium 134 (L) 135 - 145 mmol/L   Potassium 3.3 (L) 3.5 - 5.1 mmol/L   Chloride 100 (L) 101 - 111 mmol/L   CO2 27 22 - 32 mmol/L   Glucose, Bld 108 (H) 65 - 99 mg/dL   BUN 5 (L) 6 - 20 mg/dL   Creatinine, Ser 0.75 0.61 - 1.24 mg/dL   Calcium 7.8 (L) 8.9 - 10.3 mg/dL   GFR calc non Af Amer >60 >60 mL/min   GFR calc Af Amer >60 >60 mL/min    Comment: (NOTE) The eGFR has been calculated using the CKD EPI equation. This calculation has not been validated in all clinical situations. eGFR's persistently <60 mL/min signify possible Chronic Kidney Disease.    Anion gap 7 5 - 15  CBC with Differential/Platelet     Status: Abnormal   Collection Time: 03/08/16  2:24 AM  Result Value Ref Range   WBC 7.0 4.0 - 10.5 K/uL   RBC 3.59 (L) 4.22 - 5.81 MIL/uL   Hemoglobin 9.9 (L) 13.0 - 17.0 g/dL   HCT 30.6 (L) 39.0 - 52.0 %   MCV 85.2 78.0 - 100.0 fL   MCH 27.6 26.0 - 34.0 pg   MCHC 32.4 30.0 - 36.0 g/dL   RDW 19.8 (H) 11.5 - 15.5 %   Platelets 363 150 - 400 K/uL   Neutrophils Relative % 75 %   Neutro Abs 5.2 1.7 - 7.7 K/uL   Lymphocytes Relative 9 %   Lymphs Abs 0.7 0.7 - 4.0 K/uL   Monocytes Relative 10 %   Monocytes Absolute 0.7 0.1 - 1.0 K/uL   Eosinophils Relative 6 %   Eosinophils Absolute 0.4 0.0 - 0.7 K/uL   Basophils Relative 0 %   Basophils Absolute 0.0 0.0 - 0.1 K/uL  Basic metabolic panel     Status: Abnormal   Collection Time: 03/08/16  2:24 AM  Result Value Ref Range   Sodium 137 135 - 145 mmol/L   Potassium 3.5 3.5 - 5.1 mmol/L   Chloride 99 (L) 101 - 111 mmol/L   CO2 28 22 - 32 mmol/L   Glucose, Bld 125 (H) 65 - 99 mg/dL   BUN 5 (L) 6 - 20 mg/dL   Creatinine, Ser 0.81 0.61 - 1.24 mg/dL   Calcium 8.0 (L) 8.9 - 10.3 mg/dL   GFR calc non Af Amer >60 >60 mL/min   GFR calc Af Amer >60 >60 mL/min    Comment: (NOTE) The eGFR has been calculated using the CKD EPI equation.  This calculation has not been validated in all clinical situations. eGFR's  persistently <60 mL/min signify possible Chronic Kidney Disease.    Anion gap 10 5 - 15    Ct Hip Left W Contrast  Result Date: 03/07/2016 CLINICAL DATA:  Patient with history renal cell and rectal carcinoma. Left hip pain and swelling. EXAM: CT OF THE LEFT HIP WITH CONTRAST TECHNIQUE: Multidetector CT imaging was performed following the standard protocol during bolus administration of intravenous contrast. CONTRAST:  100 ml ISOVUE-300 IOPAMIDOL (ISOVUE-300) INJECTION 61% COMPARISON:  CT abdomen and pelvis 03/04/2016. FINDINGS: Presacral abscess with a drainage catheter in place is partially imaged. Stranding in subcutaneous fatty tissues about the left buttock and hip has worsened since the prior CT scan. The left gluteus maximus muscle appears edematous. The muscles only partially imaged but no focal fluid collection is seen within the muscle. There are a few tiny locules of gas within the muscle. The left hip is located. No fracture or focal bone lesion is identified. No hip joint effusion is seen. Limited visualization of the symphysis pubis and left SI joint are unremarkable. IMPRESSION: Increased stranding in subcutaneous fat about the left buttock and hip consistent with worsened cellulitis. The left gluteal musculature is incompletely imaged but there are a few tiny locules of gas within the gluteus maximus compatible with pyomyositis. No intramuscular abscess is visualized on this examination. Presacral abscess with a drainage catheter in place. Electronically Signed   By: Inge Rise M.D.   On: 03/07/2016 20:26    Review of Systems  Constitutional: Positive for malaise/fatigue. Negative for chills and fever.  HENT: Negative.   Eyes: Negative.   Respiratory: Negative.  Negative for cough.   Cardiovascular: Negative.  Negative for chest pain.  Gastrointestinal: Negative.  Negative for blood in stool, nausea and vomiting.  Genitourinary: Positive for dysuria. Negative for flank pain and  hematuria.  Musculoskeletal: Negative.   Skin: Negative.   Neurological: Negative.   Endo/Heme/Allergies: Negative.   Psychiatric/Behavioral: Negative.    Blood pressure 124/66, pulse 87, temperature 97.5 F (36.4 C), temperature source Oral, resp. rate 14, height '5\' 5"'  (1.651 m), weight 98.9 kg (218 lb), SpO2 (!) 85 %. Physical Exam  Constitutional: He is oriented to person, place, and time. He appears well-developed.  HENT:  Head: Normocephalic.  Eyes: Pupils are equal, round, and reactive to light.  Neck: Normal range of motion.  Cardiovascular: Normal rate.   Respiratory: Effort normal.  GI: Soft.  LLQ end colostomy with stool in appliance. 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   Genitourinary:  Genitourinary Comments: Significant penoscrotal edema. Pelvic skin is dry.  Bilat neph tubes draining dark yellow uriniferous fluid. SPT draining similar uriniferous fluid. Gluteal approach pelvic drain draining similar uriniferous fluid.   Neurological: He is alert and oriented to person, place, and time.  Skin: Skin is warm.  Psychiatric: He has a normal mood and affect.    Assessment/Plan:  1 - Complex Prostatic Fistula - Pt not candidate for any sort of heroic surgical intervention in face of known metastatic cancer. THis will likely be chronic and non-healing as in prior radiation field and area of known ongoing cancer. Best means of managment is maximal diversion of urine away from fistula with continued neph tuebs and SP tube. I strongly suspect most recent exacerbation is form clogging of neph tuebs and / or SP tube therefore allowing GU system to pressurize and leak more through fistula. Pt has what I feel may be unrealistic expectations  of oncologic natural history of his disease as well as candidacy for GU reconstruction and may want second opinion at tertiary center.   Agree with bilateral neph tube and SP tube exchange which has now happened today.   2 - Renal Cell Carcinoma - NED  by imaging this admission.   3 - Nephrolithiasis -  Stone free by imaging this admission.   I will make Dr Alyson Ingles and Dahlstedt aware of patient's admission.   Andrew Kline 03/08/2016, 9:47 AM

## 2016-03-08 NOTE — Consult Note (Signed)
Consultation Note Date: 03/08/2016   Patient Name: Andrew Kline  DOB: Jan 02, 1966  MRN: [id number]  Age / Sex: 50 y.o., male  PCP: Andrew Ranks, MD Referring Physician: Eugenie Filler, MD  Reason for Consultation: Establishing goals of care, Pain control and Psychosocial/spiritual support  HPI/Patient Profile: 50 y.o. 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 Whitney Kline), renal cell carcinoma (s/p right partial nephrectomy and bilateral nephrostomy drains), prior presacral abscess adm 9/25-10/1 admitted on 03/04/2016 with fever/chills r/t recurrent infection. Drain placed for gas forming organism in presacral abscess (antibiotics given via ID) along with treatment of cellulitis. 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 Uncontrolled pain and poor prognosis r/ metastatic cancer with recurrent infections prompted palliative consult.   Clinical Assessment and Goals of Care: I met today with Andrew Kline. He is a very pleasant gentleman in very difficult circumstances. I question him about his pain. He does not complain much but is visibly tense, grimacing, and diaphoretic. He tried to shift and pull himself up a little in the bed and it takes his breath away. He acknowledges pain and little to no relief from any of the medications he has received. RN at bedside and given dilaudid IV 2 mg for pain relief. Discussed trial tonight with IV dilaudid and will reassess in the morning.   He also tells me that he has stage IV rectal cancer and "I assume that I don't have much time left." He talks about his suffering and what he has been through. He talks of depression and that he finds himself even when just sitting and watching sports beginning to cry. As we were beginning to discuss [name] and his cancer the dilaudid seemed to kicking  in and he was loosing his train of thought and thoughts would be delayed. Also becoming sleepy. Told him he should rest and that we could talk more tomorrow morning when we reassess his pain management. He agrees.   Will also reach out to his oncologist Dr. Whitney Kline for input and recommendations.   Primary Decision Maker NEXT OF KIN?? Significant other Andrew Kline?? Will need to reassess tomorrow. Patient can speak for himself.     SUMMARY OF RECOMMENDATIONS   Focus of today was on symptom management - mainly pain and therapeutic listening. Will continue [place] conversation upon follow up tomorrow.   Code Status/Advance Care Planning:  Full code - did not discuss today   Symptom Management:   Insomnia: Continue Ambien but only ordered 5 mg and he was ordered ambien CR 12.5 12/31/2015 per oncology notes. This may need to be increased but will work on pain management first.   Bowel Regimen: Continue Senokot-S BID.   Acute on chronic pain of multiple sources but mostly infected gluteal absess with drain and surrounding area. Unable to differentiate his pain at this time.   I have ordered Dilaudid 1-2 mg IV every 3 hours prn for acute relief. Depending  on amount of relief achieved we may need to consider PCA tomorrow.   Please continue home doses Fentanyl TD patch 150 mcg/hr.  Continue home dose Cymbalta, Lyrica, Amitryptaline.    Palliative Prophylaxis:   Bowel Regimen, Delirium Protocol and Frequent Pain Assessment  Additional Recommendations (Limitations, Scope, Preferences):  Full Scope Treatment need to discuss further his wishes  Psycho-social/Spiritual:   Desire for further Chaplaincy support:no  Additional Recommendations: Caregiving  Support/Resources and Education on Hospice  Prognosis:   I fear his extent of infection and complications may take further chemotherapy off the table. Overall prognosis poor. Oncology to weigh in.   Discharge Planning: To Be Determined        Primary Diagnoses: Present on Admission: . Sepsis ([place]) . Pelvic fluid collection . Rectal cancer metastasized to lung ([place]) . Renal cell carcinoma ([place]) . Hyponatremia . Complicated UTI (urinary tract infection) . Abscess   I have reviewed the medical record, interviewed the patient and family, and examined the patient. The following aspects are pertinent.  Past Medical History:  Diagnosis Date  . Anxiety   . Arthritis   . Depression   . Kidney stone 03/22/15   left  . Rectal cancer ([place])   . Rectal pain   . Renal cell cancer ([place])    2012.   [name] Shortness of breath dyspnea    Social History   Social History  . Marital status: Divorced    Spouse name: N/A  . Number of children: 2  . Years of education: N/A   Occupational History  . disabled    Social History Main Topics  . Smoking status: Current Every Day Smoker    Packs/day: 0.50    Years: 30.00    Types: Cigarettes  . Smokeless tobacco: Never Used  . Alcohol use 0.0 oz/week     Comment: Occ  . Drug use: No  . Sexual activity: Yes    Birth control/ protection: None   Other Topics Concern  . None   Social History Narrative  . None   Family History  Problem Relation Age of Onset  . Healthy Mother   . Lung cancer Maternal Grandfather   . Colon cancer Maternal Uncle     age 76  . Diabetes Other    Scheduled Meds: . amitriptyline  50 mg Oral QHS  . DULoxetine  60 mg Oral Daily  . enoxaparin (LOVENOX) injection  40 mg Subcutaneous Q24H  . fentaNYL  150 mcg Transdermal Q72H  . fentaNYL      . lidocaine      . piperacillin-tazobactam (ZOSYN)  IV  3.375 g Intravenous Q8H  . pregabalin  150 mg Oral BID  . senna-docusate  1 tablet Oral BID  . sodium chloride flush  3 mL Intravenous Q12H  . vancomycin  1,000 mg Intravenous Q8H   Continuous Infusions: PRN Meds:.acetaminophen **OR** acetaminophen, albuterol, HYDROmorphone (DILAUDID) injection, HYDROmorphone, ketorolac, ondansetron **OR**  ondansetron (ZOFRAN) IV, zolpidem Allergies  Allergen Reactions  . Oxaliplatin Itching   Review of Systems  Constitutional: Positive for activity change, diaphoresis and fatigue. Negative for appetite change and unexpected weight change.  Cardiovascular: Positive for leg swelling.  Gastrointestinal: Positive for abdominal pain. Negative for nausea and vomiting.  Musculoskeletal: Positive for back pain.  Psychiatric/Behavioral:       Tearful    Physical Exam  Constitutional: He is oriented to person, place, and time. He appears well-developed and well-nourished.  HENT:  Head: Normocephalic and atraumatic.  Cardiovascular: Normal rate.  Pulmonary/Chest: Effort normal. No accessory muscle usage. No tachypnea. No respiratory distress.  Abdominal:  Colostomy, bilat nephrostomy drains  Neurological: He is alert and oriented to person, place, and time.  Skin: He is diaphoretic.  Nursing note and vitals reviewed.   Vital Signs: BP (!) 105/50   Pulse 69   Temp 98.1 F (36.7 C) (Oral)   Resp 10   Ht '5\' 5"'  (1.651 m)   Wt 98.9 kg (218 lb) Comment: Weight taken by RN  SpO2 92%   BMI 36.28 kg/m  Pain Assessment: Faces POSS *See Group Information*: 1-Acceptable,Awake and alert Pain Score: Asleep   SpO2: SpO2: 92 % O2 Device:SpO2: 92 % O2 Flow Rate: .O2 Flow Rate (L/min): 2 L/min  IO: Intake/output summary:  Intake/Output Summary (Last 24 hours) at 03/08/16 2013 Last data filed at 03/08/16 1900  Gross per 24 hour  Intake             1743 ml  Output             1550 ml  Net              193 ml    LBM: Last BM Date: 03/06/16 Baseline Weight: Weight: 81.6 kg (180 lb) Most recent weight: Weight: 98.9 kg (218 lb) (Weight taken by RN)     Palliative Assessment/Data:     Time In: 1720 Time Out: 1830 Time Total: 64mn Greater than 50%  of this time was spent counseling and coordinating care related to the above assessment and plan.  Signed by: AVinie Sill  NP Palliative Medicine Team Pager # [phone](M-F 8a-5p) Team Phone # [phone](Nights/Weekends)

## 2016-03-08 NOTE — Progress Notes (Signed)
MD notified, patient request palliative consult/pain management. Need order to access port for IV therapy. New orders received.

## 2016-03-08 NOTE — Plan of Care (Signed)
Problem: Pain Managment: Goal: General experience of comfort will improve Outcome: Progressing Patient requests pain medication when appropriate. Pain is not well controlled at this time; however, there are orders for palliative medicine to consult on the patient regarding pain.   Problem: Skin Integrity: Goal: Risk for impaired skin integrity will decrease Outcome: Progressing Patient encouraged to reposition in bed. Able to turn self. Currently with skin breakdown from abscess and drains; however, care taken to offload and dressings changed/ sites assessed. Sacral foam placed to prevent skin breakdown over bony prominence.

## 2016-03-08 NOTE — Sedation Documentation (Signed)
Vital signs stable. 

## 2016-03-08 NOTE — Progress Notes (Signed)
Patient ID: Andrew Kline, male   DOB: 1965-09-18, 50 y.o.   MRN: [id number]    Referring Physician(s): Dr. Irine Seal  Supervising Physician: Daryll Brod  Patient Status: [facility] - In-pt  Chief Complaint: Pelvic fluid collection  Subjective: Patient is complaining of pain on his left buttocks down his posterior thigh.  Allergies: Oxaliplatin  Medications: Prior to Admission medications   Medication Sig Start Date End Date Taking? Authorizing Provider  albuterol (PROVENTIL HFA;VENTOLIN HFA) 108 (90 Base) MCG/ACT inhaler Inhale 2 puffs into the lungs every 6 (six) hours as needed for wheezing or shortness of breath. 05/26/15  Yes Patrici Ranks, MD  amitriptyline (ELAVIL) 50 MG tablet Take 1 tablet (50 mg total) by mouth at bedtime. 12/31/15  Yes Patrici Ranks, MD  docusate sodium (COLACE) 100 MG capsule Take 1 capsule (100 mg total) by mouth every 12 (twelve) hours. Patient taking differently: Take 100 mg by mouth daily as needed for mild constipation or moderate constipation.  09/25/14  Yes Christopher Lawyer, PA-C  DULoxetine (CYMBALTA) 60 MG capsule Take 1 capsule (60 mg total) by mouth daily. 12/31/15  Yes Patrici Ranks, MD  fentaNYL (DURAGESIC - DOSED MCG/HR) 75 MCG/HR Place 2 patches (150 mcg total) onto the skin every 3 (three) days. 02/01/16  Yes Manon Hilding Kefalas, PA-C  HYDROmorphone (DILAUDID) 4 MG tablet Take 1 tablet (4 mg total) by mouth every 4 (four) hours as needed for severe pain. 02/22/16  Yes Manon Hilding Kefalas, PA-C  pregabalin (LYRICA) 75 MG capsule Take 2 capsules (150 mg total) by mouth 2 (two) times daily. 12/31/15  Yes Patrici Ranks, MD  zolpidem (AMBIEN CR) 12.5 MG CR tablet Take 1 tablet (12.5 mg total) by mouth at bedtime as needed for sleep. 12/31/15  Yes Patrici Ranks, MD    Vital Signs: BP 124/66   Pulse 87   Temp 97.5 F (36.4 C) (Oral)   Resp 14   Ht '5\' 5"'$  (1.651 m)   Wt 218 lb (98.9 kg) Comment: Weight taken by RN  SpO2 (!) 85%    BMI 36.28 kg/m   Physical Exam: Abd: suprapubic catheter in place with clear yellow urine in bag.  Both PCNs in place as well in the back with drain sites c/d/i.  Both drains with clear yellow urine as well.  Transgluteal drain with most serous, slight sanguineous tinge to fluid, appears to look the same as the other bags that have urine in them.  Drain site is clean; however this entire side of his buttock is erythematous, edematous with some small vesicles forming.  Imaging: Ct Hip Left W Contrast  Result Date: 03/07/2016 CLINICAL DATA:  Patient with history renal cell and rectal carcinoma. Left hip pain and swelling. EXAM: CT OF THE LEFT HIP WITH CONTRAST TECHNIQUE: Multidetector CT imaging was performed following the standard protocol during bolus administration of intravenous contrast. CONTRAST:  100 ml ISOVUE-300 IOPAMIDOL (ISOVUE-300) INJECTION 61% COMPARISON:  CT abdomen and pelvis 03/04/2016. FINDINGS: Presacral abscess with a drainage catheter in place is partially imaged. Stranding in subcutaneous fatty tissues about the left buttock and hip has worsened since the prior CT scan. The left gluteus maximus muscle appears edematous. The muscles only partially imaged but no focal fluid collection is seen within the muscle. There are a few tiny locules of gas within the muscle. The left hip is located. No fracture or focal bone lesion is identified. No hip joint effusion is seen. Limited visualization of the symphysis pubis  and left SI joint are unremarkable. IMPRESSION: Increased stranding in subcutaneous fat about the left buttock and hip consistent with worsened cellulitis. The left gluteal musculature is incompletely imaged but there are a few tiny locules of gas within the gluteus maximus compatible with pyomyositis. No intramuscular abscess is visualized on this examination. Presacral abscess with a drainage catheter in place. Electronically Signed   By: Inge Rise M.D.   On: 03/07/2016  20:26   Ct Abdomen Pelvis W Contrast  Result Date: 03/04/2016 CLINICAL DATA:  Fever, low back pain, LEFT foot pain, shortness of breath, tachycardia, generalized abdominal pain, history renal cell carcinoma, rectal cancer, kidney stones, former smoker EXAM: CT ABDOMEN AND PELVIS WITH CONTRAST TECHNIQUE: Multidetector CT imaging of the abdomen and pelvis was performed using the standard protocol following bolus administration of intravenous contrast. Sagittal and coronal MPR images reconstructed from axial data set. CONTRAST:  137m ISOVUE-300 IOPAMIDOL (ISOVUE-300) INJECTION 61% COMPARISON:  01/26/2016, 01/12/2016 FINDINGS: Lower chest: Bibasilar pulmonary metastases again identified. 2.1 x 1.7 cm diameter RIGHT lower lobe metastasis image 7 has cavitated centrally since previous exam. LEFT lung base metastasis 17 x 13 mm image 15 appeared larger on the previous exam at approximately 17 x 18 mm. Other nodules appear grossly stable Hepatobiliary: Mild diffuse fatty infiltration of liver. Hypervascular focus lateral segment LEFT lobe liver 11 mm diameter image 19 Pancreas: Chronic calcific pancreatitis changes. No definite pancreatic mass or fluid collection. Spleen: Normal appearance Adrenals/Urinary Tract: Adrenal glands normal appearance. BILATERAL nephrostomy tubes without renal mass or hydronephrosis. Normal caliber ureters and bladder. Suprapubic catheter in bladder. Mild diffuse bladder wall thickening which could reflect chronic infection, chronic outlet obstruction, or prior radiation therapy. Stomach/Bowel: Normal appendix. LEFT lower quadrant colostomy with parastomal herniation of nonobstructed small bowel loops. Scattered stool throughout colon. Post perineal proctectomy per chart. Large gas and fluid collection and presacral space, with thickened wall overall measuring 6.2 x 5.9 x 9.2 cm. Collection appears larger and the presence of air within the collection is new since the previous exam.  Vascular/Lymphatic: Atherosclerotic calcifications aorta without aneurysm. Reproductive: N/A Other: No free air or free fluid. Musculoskeletal: Infiltrative changes and enlargement of the LEFT gluteus maximus, containing probable fluid and multiple foci of gas consistent with infection, potentially by gas-forming organism versus extension of the presacral space process. No acute osseous findings. IMPRESSION: Increase in size of a presacral collection now 6.2 x 5.9 x 9.2 cm, newly containing gas, question abscess. New enlargement and gas within the LEFT gluteus maximus likely representing infection, question by a gas-forming organism. Interval resolution of BILATERAL hydronephrosis post nephrostomy placement. 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 Pulmonary metastases, with a LEFT lower lobe lesion appearing slightly smaller and a RIGHT lower lead lesion now cavitated. Bladder wall thickening question due to chronic infection, chronic outlet obstruction, or prior pelvic irradiation. Chronic calcific pancreatitis. 11 mm hypervascular focus lateral segment LEFT lobe liver cannot exclude hypervascular metastasis. Findings called to Dr. ZRoderic Palauon 03/04/2016 at 1800 hours. Electronically Signed   By: MLavonia DanaM.D.   On: 03/04/2016 18:01   Dg Chest Port 1 View  Result Date: 03/04/2016 CLINICAL DATA:  Several days of fever, tachycardia, back pain, and shortness of breath. Patient recently hospitalized for severe kidney infection. EXAM: PORTABLE CHEST 1 VIEW COMPARISON:  Portable chest x-ray of January 25, 2016 FINDINGS: The lungs are adequately inflated. The interstitial markings are prominent especially on the right. The heart  is mildly enlarged. The central pulmonary vascularity is prominent. The mediastinum is normal in width. There is no pleural effusion. The power port catheter tip projects over the midportion of the SVC. The bony  thorax is unremarkable. IMPRESSION: Mild interstitial prominence greatest on the left not as conspicuous as on the previous study. This likely reflects low-grade pulmonary edema. There is no alveolar pneumonia nor pleural effusion. When the patient can tolerate the procedure, a PA and lateral chest x-ray would be useful. Electronically Signed   By: David  Martinique M.D.   On: 03/04/2016 16:40   Ct Image Guided Drainage By Percutaneous Catheter  Result Date: 03/05/2016 INDICATION: Fever, elevated white count, recurrent presacral pelvic abscess. 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 EXAM: CT GUIDED DRAINAGE OF RECURRENT PRESACRAL PELVIC ABSCESS MEDICATIONS: The patient is currently admitted to the hospital and receiving intravenous antibiotics. The antibiotics were administered within an appropriate time frame prior to the initiation of the procedure. ANESTHESIA/SEDATION: 2.0 mg IV Versed 200 mcg IV Fentanyl Moderate Sedation Time:  14 MINUTES The patient was continuously monitored during the procedure by the interventional radiology nurse under my direct supervision. COMPLICATIONS: None immediate. TECHNIQUE: Informed written consent was obtained from the patient after a thorough discussion of the procedural risks, benefits and alternatives. All questions were addressed. Maximal Sterile Barrier Technique was utilized including caps, mask, sterile gowns, sterile gloves, sterile drape, hand hygiene and skin antiseptic. A timeout was performed prior to the initiation of the procedure. PROCEDURE: Previous imaging reviewed. Patient positioned prone. Noncontrast localization CT performed. The presacral pelvic abscess was localized. Abscess cavity now containing dilute contrast, suspect from fistula the bladder or bowel The left gluteal region was prepped with ChloraPrep in a sterile fashion, and a sterile drape was applied covering the operative field. A sterile gown and sterile gloves were used for the procedure. Local  anesthesia was provided with 1% Lidocaine. Under sterile conditions and local anesthesia, an 18 gauge 10 cm access needle was advanced from a left trans gluteal approach into the fluid collection. Needle position confirmed. Syringe aspiration yielded fecal contaminated fluid. Guidewire inserted followed by tract dilatation to advance a 10 [place] drain. Drain catheter position confirmed with CT. Syringe aspiration yielded 50 cc purulent fecal contaminated fluid. Catheter secured with a Prolene suture and connected to external gravity drainage bag. Sterile dressing applied. No immediate complication. Patient tolerated the procedure well. FINDINGS: CT imaging confirms needle access of the presacral abscess for drain insertion. IMPRESSION: Successful CT-guided left trans gluteal presacral abscess drain insertion. Dilute contrast within the abscess cavity from the recent CT, suspicious for fistula from either the bowel or bladder. Electronically Signed   By: Jerilynn Mages.  Shick M.D.   On: 03/05/2016 13:06    Labs:  CBC:  Recent Labs  03/05/16 0122 03/06/16 0121 03/07/16 0319 03/08/16 0224  WBC 13.3* 9.7 8.6 7.0  HGB 10.8* 10.4* 10.0* 9.9*  HCT 32.5* 31.8* 30.9* 30.6*  PLT 230 293 320 363    COAGS:  Recent Labs  09/23/15 0900 11/12/15 0945 01/26/16 1540 03/04/16 2313  INR 1.15 1.12 1.00 1.33  APTT 38*  --   --  41*    BMP:  Recent Labs  03/05/16 0122 03/06/16 0121 03/07/16 0319 03/08/16 0224  NA 133* 134* 134* 137  K 3.6 3.6 3.3* 3.5  CL 104 100* 100* 99*  CO2 '22 26 27 28  '$ GLUCOSE 145* 141* 108* 125*  BUN 8 10 5* 5*  CALCIUM 7.1* 7.8* 7.8* 8.0*  CREATININE  0.91 0.86 0.75 0.81  GFRNONAA >60 >60 >60 >60  GFRAA >60 >60 >60 >60    LIVER FUNCTION TESTS:  Recent Labs  02/09/16 0854 02/23/16 0915 03/04/16 1616 03/06/16 0121  BILITOT 0.2* 0.3 1.3* 0.5  AST 18 18 40 22  ALT 13* 17 33 22  ALKPHOS 99 87 154* 111  PROT 7.4 5.8* 7.2 5.4*  ALBUMIN 3.5 2.9* 3.1* 1.9*    Assessment  and Plan: 1. Presacral abscess, s/p TG drain placement on 11/4 -significant erythema around this drain encompassing his entire buttock cheek and down his thigh.  The drain itself appears to be draining urine, c/w thoughts of a fistula -will d/w Dr. Annamaria Boots, general surgery recommends the presacral drain to be removed given cellulitis 2. S/p B PCN drain placements in September -these need to be exchanged.  Awaiting order for such. 3. S/p suprapubic drain exchange 11/6 -cont suprapubic drain in place for bladder decompression  Electronically Signed: Reiley Keisler E 03/08/2016, 9:07 AM   I spent a total of 25 Minutes at the the patient's bedside AND on the patient's hospital floor or unit, greater than 50% of which was counseling/coordinating care for PCNs, suprapubic catheter management, presacral abscess with bladder fistula, cellulitis of buttock

## 2016-03-08 NOTE — Procedures (Signed)
S/P BILATERAL PCN EXCHGS  NO COMP STABLE TO GRAVITY DRAINS FULL REPORT IN PACS

## 2016-03-09 ENCOUNTER — Encounter (HOSPITAL_COMMUNITY): Payer: Medicaid Other

## 2016-03-09 DIAGNOSIS — Z7189 Other specified counseling: Secondary | ICD-10-CM

## 2016-03-09 LAB — CULTURE, BLOOD (ROUTINE X 2)
CULTURE: NO GROWTH
CULTURE: NO GROWTH

## 2016-03-09 LAB — CBC WITH DIFFERENTIAL/PLATELET
BASOS ABS: 0 10*3/uL (ref 0.0–0.1)
Basophils Relative: 1 %
EOS ABS: 0.4 10*3/uL (ref 0.0–0.7)
EOS PCT: 7 %
HCT: 32.4 % — ABNORMAL LOW (ref 39.0–52.0)
HEMOGLOBIN: 10.1 g/dL — AB (ref 13.0–17.0)
LYMPHS ABS: 0.6 10*3/uL — AB (ref 0.7–4.0)
LYMPHS PCT: 10 %
MCH: 26.9 pg (ref 26.0–34.0)
MCHC: 31.2 g/dL (ref 30.0–36.0)
MCV: 86.4 fL (ref 78.0–100.0)
Monocytes Absolute: 0.7 10*3/uL (ref 0.1–1.0)
Monocytes Relative: 12 %
NEUTROS PCT: 70 %
Neutro Abs: 4.4 10*3/uL (ref 1.7–7.7)
PLATELETS: 409 10*3/uL — AB (ref 150–400)
RBC: 3.75 MIL/uL — AB (ref 4.22–5.81)
RDW: 19.9 % — ABNORMAL HIGH (ref 11.5–15.5)
WBC: 6.2 10*3/uL (ref 4.0–10.5)

## 2016-03-09 LAB — RENAL FUNCTION PANEL
ALBUMIN: 2 g/dL — AB (ref 3.5–5.0)
Anion gap: 8 (ref 5–15)
BUN: 7 mg/dL (ref 6–20)
CHLORIDE: 101 mmol/L (ref 101–111)
CO2: 27 mmol/L (ref 22–32)
Calcium: 8.1 mg/dL — ABNORMAL LOW (ref 8.9–10.3)
Creatinine, Ser: 0.8 mg/dL (ref 0.61–1.24)
Glucose, Bld: 97 mg/dL (ref 65–99)
PHOSPHORUS: 3.7 mg/dL (ref 2.5–4.6)
POTASSIUM: 3.7 mmol/L (ref 3.5–5.1)
Sodium: 136 mmol/L (ref 135–145)

## 2016-03-09 LAB — GLUCOSE, CAPILLARY: GLUCOSE-CAPILLARY: 166 mg/dL — AB (ref 65–99)

## 2016-03-09 MED ORDER — HYDROMORPHONE 1 MG/ML IV SOLN
INTRAVENOUS | Status: DC
  Administered 2016-03-09: 2.6 mg via INTRAVENOUS
  Administered 2016-03-09: 0.563 mg via INTRAVENOUS
  Administered 2016-03-09: 2.4 mg via INTRAVENOUS
  Administered 2016-03-09: 1 mg via INTRAVENOUS
  Administered 2016-03-10: 2.29 mg via INTRAVENOUS
  Administered 2016-03-10: 3.03 mg via INTRAVENOUS
  Administered 2016-03-10: 2.25 mg via INTRAVENOUS
  Filled 2016-03-09: qty 25

## 2016-03-09 MED ORDER — NALOXONE HCL 0.4 MG/ML IJ SOLN: 0.4000 mg | INTRAMUSCULAR | Status: DC | PRN

## 2016-03-09 MED ORDER — DIPHENHYDRAMINE HCL 50 MG/ML IJ SOLN: 12.5000 mg | Freq: Four times a day (QID) | INTRAMUSCULAR | Status: DC | PRN

## 2016-03-09 MED ORDER — DIPHENHYDRAMINE HCL 12.5 MG/5ML PO ELIX
12.5000 mg | ORAL_SOLUTION | Freq: Four times a day (QID) | ORAL | Status: DC | PRN
  Filled 2016-03-09: qty 5

## 2016-03-09 MED ORDER — SODIUM CHLORIDE 0.9% FLUSH: 9.0000 mL | INTRAVENOUS | Status: DC | PRN

## 2016-03-09 NOTE — Evaluation (Signed)
Physical Therapy Evaluation Patient Details Name: Andrew Kline MRN: [id number] DOB: 1966/04/02 Today's Date: 03/09/2016   History of Present Illness  Pt with hx of rectal cancer (s/p colostomy) with mets to lung, renal cell carcinoma-R kidney s/p partial nephrectomy, B nephrostomy drains, suprapubic catheter, presacral abscess, cellulits L LE.  Clinical Impression  Pt presented supine in bed with HOB elevated, awake and willing to participate in therapy session. Pt's wife was present throughout session as well. Pt moving very well during evaluation with min guard for functional mobility for safety. Pt would continue to benefit from skilled physical therapy services at this time while admitted to address his below listed limitations in order to improve his overall safety and independence with functional mobility.     Follow Up Recommendations Supervision for mobility/OOB    Equipment Recommendations  None recommended by PT    Recommendations for Other Services       Precautions / Restrictions Precautions Precautions: Fall Precaution Comments: multiple drains Restrictions Weight Bearing Restrictions: No      Mobility  Bed Mobility Overal bed mobility: Needs Assistance Bed Mobility: Supine to Sit     Supine to sit: Min guard;HOB elevated     General bed mobility comments: increased time, no physical assist  Transfers Overall transfer level: Needs assistance Equipment used: Rolling walker (2 wheeled) Transfers: Sit to/from Stand Sit to Stand: Min guard         General transfer comment: pt required increased time, VC'ing for bilateral hand placement but despite cueing he pulled with bilateral UEs on RW  Ambulation/Gait Ambulation/Gait assistance: Min guard Ambulation Distance (Feet): 100 Feet Assistive device: Rolling walker (2 wheeled) Gait Pattern/deviations: Step-to pattern;Decreased step length - right;Decreased stance time - left;Decreased weight shift to  left;Decreased stride length;Trunk flexed Gait velocity: decreased Gait velocity interpretation: Below normal speed for age/gender General Gait Details: pt required VC'ing for safety with use of RW and to maintain a safe distance from [employer]    Modified Rankin (Stroke Patients Only)       Balance Overall balance assessment: Needs assistance Sitting-balance support: Feet supported;No upper extremity supported Sitting balance-Leahy Scale: Good     Standing balance support: During functional activity;Bilateral upper extremity supported Standing balance-Leahy Scale: Poor Standing balance comment: pt reliant on bilateral UEs on RW                             Pertinent Vitals/Pain Pain Assessment: Faces Faces Pain Scale: Hurts little more Pain Location: L LE with movement Pain Descriptors / Indicators: Throbbing Pain Intervention(s): Monitored during session;Repositioned    Home Living Family/patient expects to be discharged to:: Private residence Living Arrangements: Spouse/significant other;Children Available Help at Discharge: Available 24 hours/day Type of Home: House Home Access: Stairs to enter Entrance Stairs-Rails: None Entrance Stairs-Number of Steps: 3 Home Layout: One level Home Equipment: Environmental consultant - 2 wheels;Cane - single point      Prior Function Level of Independence: Independent               Hand Dominance   Dominant Hand: Right    Extremity/Trunk Assessment   Upper Extremity Assessment: Defer to OT evaluation           Lower Extremity Assessment: Generalized weakness         Communication   Communication: No difficulties  Cognition Arousal/Alertness: Awake/alert Behavior During Therapy:  WFL for tasks assessed/performed Overall Cognitive Status: Within Functional Limits for tasks assessed                      General Comments      Exercises     Assessment/Plan     PT Assessment Patient needs continued PT services  PT Problem List Decreased strength;Decreased activity tolerance;Decreased balance;Decreased mobility;Decreased coordination;Decreased knowledge of use of DME;Pain          PT Treatment Interventions DME instruction;Gait training;Stair training;Functional mobility training;Therapeutic activities;Therapeutic exercise;Balance training;Neuromuscular re-education;Patient/family education    PT Goals (Current goals can be found in the Care Plan section)  Acute Rehab PT Goals Patient Stated Goal: to return home PT Goal Formulation: With patient/family Time For Goal Achievement: 03/23/16 Potential to Achieve Goals: Fair    Frequency Min 3X/week   Barriers to discharge        Co-evaluation PT/OT/SLP Co-Evaluation/Treatment: Yes Reason for Co-Treatment: For patient/therapist safety PT goals addressed during session: Mobility/safety with mobility;Balance;Proper use of DME OT goals addressed during session: ADL's and self-care       End of Session   Activity Tolerance: Patient limited by pain Patient left: in chair;with call bell/phone within reach;with family/visitor present Nurse Communication: Mobility status         Time: 1310-1345 PT Time Calculation (min) (ACUTE ONLY): 35 min   Charges:   PT Evaluation $PT Eval Moderate Complexity: 1 Procedure     PT G CodesClearnce Sorrel Denelda Akerley 03/09/2016, 2:58 PM Sherie Don, [place], DPT [phone]

## 2016-03-09 NOTE — Progress Notes (Signed)
OT Cancellation Note  Patient Details Name: Andrew Kline MRN: [id number] DOB: [birth year]   Cancelled Treatment:    Reason Eval/Treat Not Completed: Pain limiting ability to participate (Receiving pain medication. Will try back.)  [name] 03/09/2016, 8:47 AM  [phone]

## 2016-03-09 NOTE — Plan of Care (Signed)
Problem: Pain Managment: Goal: General experience of comfort will improve Outcome: Progressing Pt on PCA pump for better pain management

## 2016-03-09 NOTE — Plan of Care (Signed)
Problem: Pain Managment: Goal: General experience of comfort will improve Outcome: Not Progressing Patient states pain still not managed with medication regimen. Improved with change from PO to IV dilaudid, but patient still in a constant pain. Palliative due to consult further and discuss goals of care with patient 03/09/2016.  Problem: Activity: Goal: Risk for activity intolerance will decrease Outcome: Progressing Patient up to chair and able to walk in room. Patient expresses want to be more active while in hospital.

## 2016-03-09 NOTE — Progress Notes (Signed)
Daily Progress Note   Patient Name: Andrew Kline       Date: 03/09/2016 DOB: [birth year]  Age: 50 y.o. MRN#: [id number] Attending Physician: Robbie Lis, MD Primary Care Physician: [name], MD Admit Date: 03/04/2016  Reason for Consultation/Follow-up: Establishing goals of care and Pain control  Subjective: I met today with Andrew Kline and his wife Leanne. We discussed his pain and options and his is open to recommendations. We have decided to start PCA with hopes of better pain control and better identifying his pain dosage needs. I am thinking that he may be a candidate for methadone if QTc allows. I have left message for Dr. [name] as she will likely be managing his pain after hospitalization.  We also discussed GOC. Joslyn Devon says that he has avoided these conversations but he humors [place] today. We discussed his recurrent infections and how this effects his plans for chemotherapy and that even the fact that he has such severe infections is worrisome for the benefit vs risk of chemotherapy as well. He tells me that his goal is to work on managing and treating his infection, pain control, and to work towards continuing chemotherapy. We also discussed code status and he is quick to say "I won't sign a DNR." He does admit that he would not want to be kept alive on machines but then changed the subject.  He also identifies Leanne as his surrogate (when she is not in the room). I did educate the importance of HCPOA if they are not legally married but he says that they are. I will follow up tomorrow.   Length of Stay: 5  Current Medications: Scheduled Meds:  . amitriptyline  50 mg Oral QHS  . DULoxetine  60 mg Oral Daily  . enoxaparin (LOVENOX) injection  40 mg Subcutaneous Q24H  .  fentaNYL  150 mcg Transdermal Q72H  . HYDROmorphone   Intravenous Q4H  . piperacillin-tazobactam (ZOSYN)  IV  3.375 g Intravenous Q8H  . pregabalin  150 mg Oral BID  . senna-docusate  1 tablet Oral BID  . sodium chloride flush  3 mL Intravenous Q12H  . vancomycin  1,000 mg Intravenous Q8H    Continuous Infusions:   PRN Meds: acetaminophen **OR** acetaminophen, albuterol, diphenhydrAMINE **OR** diphenhydrAMINE, ketorolac, naloxone **AND** sodium chloride flush, ondansetron **OR** ondansetron (ZOFRAN)  IV, zolpidem  Physical Exam  Constitutional: He is oriented to person, place, and time. He appears well-developed and well-nourished.  HENT:  Head: Normocephalic and atraumatic.  Cardiovascular: Normal rate.   Pulmonary/Chest: Effort normal. No accessory muscle usage. No tachypnea. No respiratory distress.  Abdominal:  Colostomy and bilateral nephrostomy tubes.   Neurological: He is alert and oriented to person, place, and time.  Occasionally makes confused statements.   Nursing note and vitals reviewed.           Vital Signs: BP (!) 121/96   Pulse 84   Temp 98 F (36.7 C) (Oral)   Resp 14   Ht 5' 5" (1.651 m)   Wt 98.9 kg (218 lb) Comment: Weight taken by RN  SpO2 92%   BMI 36.28 kg/m  SpO2: SpO2: 92 % O2 Device: O2 Device: Not Delivered O2 Flow Rate: O2 Flow Rate (L/min): 2 L/min  Intake/output summary:  Intake/Output Summary (Last 24 hours) at 03/09/16 1324 Last data filed at 03/09/16 1043  Gross per 24 hour  Intake             2016 ml  Output             1650 ml  Net              366 ml   LBM: Last BM Date: 03/07/16 Baseline Weight: Weight: 81.6 kg (180 lb) Most recent weight: Weight: 98.9 kg (218 lb) (Weight taken by RN)       Palliative Assessment/Data:      Patient Active Problem List   Diagnosis Date Noted  . Pyomyositis: Left gluteus maximus per CT 03/07/2016 03/08/2016  . Renal carcinoma ([place])   . Sepsis affecting skin ([place])   . Suprapubic  catheter ([place])   . Uncontrolled pain   . Palliative care encounter   . Fever   . Abscess   . Sepsis ([place]) 03/04/2016  . Renal cell carcinoma ([place]) 03/04/2016  . Hyponatremia 03/04/2016  . Complicated UTI (urinary tract infection) 01/25/2016  . Pelvic fluid collection 11/06/2015  . Physical deconditioning 07/07/2015  . Muscular deconditioning 07/07/2015  . Malnutrition of moderate degree ([place]) 02/13/2015  . Rectal carcinoma ([place]) 02/11/2015  . Rectal cancer metastasized to lung ([place]) 10/30/2014  . Rectal pain 09/26/2014  . Multiple lung nodules on CT 09/26/2014    Palliative Care Assessment & Plan   HPI: 50 y.o. 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 [name]), renal cell carcinoma (s/p right partial nephrectomy and bilateral nephrostomy drains), prior presacral abscess adm 9/25-10/1 admitted on 03/04/2016 with fever/chills r/t recurrent infection. Drain placed for gas forming organism in presacral abscess (antibiotics given via ID) along with treatment of cellulitis. 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 Uncontrolled pain and poor prognosis r/ metastatic cancer with recurrent infections prompted palliative consult.   Assessment: Lying in bed. He appears less tense than yesterday until he makes any movement.   Symptom Management:   Insomnia: Continue Ambien but only ordered 5 mg and he was ordered ambien CR 12.5 12/31/2015 per oncology notes. This may need to be increased but will work on pain management first.   Bowel Regimen: Continue Senokot-S BID.   Acute on chronic pain of multiple sources but mostly infected gluteal absess with drain and surrounding area. Unable to differentiate his pain at this time.  ? PCA Dilaudid: 0.5 mg/hr basal rate. 0.3  mg every 10 min prn.  ? Continue Fentanyl TD patch 150 mcg/hr. ? Continue home dose Cymbalta, Lyrica, Amitryptaline.    Goals of  Care and Additional Recommendations:  Limitations on Scope of Treatment: Full Scope Treatment  Code Status:  Full code  Prognosis:   I fear his extent of infection and complications may take further chemotherapy off the table. Overall prognosis poor. Oncology to weigh in.    Discharge Planning:  To Be Determined  Thank you for allowing the Palliative Medicine Team to assist in the care of this patient.   Time In: 1100 Time Out: 1200 Total Time 27mn Prolonged Time Billed  no       Greater than 50%  of this time was spent counseling and coordinating care related to the above assessment and plan.  AVinie Sill NP Palliative Medicine Team Pager # [phone](M-F 8a-5p) Team Phone # [phone](Nights/Weekends)

## 2016-03-09 NOTE — Evaluation (Signed)
Occupational Therapy Evaluation Patient Details Name: Andrew Kline MRN: [id number] DOB: 1966/02/29 Today's Date: 03/09/2016    History of Present Illness Pt with hx of rectal cancer (s/p colostomy) with mets to lung, renal cell carcinoma-R kidney s/p partial nephrectomy, B nephrostomy drains, suprapubic catheter, presacral abscess, cellulits L LE.   Clinical Impression   Pt was independent at baseline. Presents with pain, generalized weakness, impaired balance and decreased activity tolerance. Readily willing to ambulate and remain up in chair. Will follow acutely.     Follow Up Recommendations  No OT follow up    Equipment Recommendations       Recommendations for Other Services       Precautions / Restrictions Precautions Precautions: Fall Precaution Comments: multiple drains Restrictions Weight Bearing Restrictions: No      Mobility Bed Mobility Overal bed mobility: Needs Assistance Bed Mobility: Supine to Sit     Supine to sit: Min guard;HOB elevated     General bed mobility comments: increased time, no physical assist  Transfers Overall transfer level: Needs assistance Equipment used: Rolling walker (2 wheeled) Transfers: Sit to/from Stand Sit to Stand: Min guard         General transfer comment: cues for hand placement, assist for safety and lines    Balance Overall balance assessment: Needs assistance   Sitting balance-Leahy Scale: Good       Standing balance-Leahy Scale: Poor                              ADL Overall ADL's : Needs assistance/impaired Eating/Feeding: Independent;Sitting   Grooming: Set up;Sitting   Upper Body Bathing: Set up;Sitting   Lower Body Bathing: Minimal assistance;Sit to/from stand   Upper Body Dressing : Set up;Sitting   Lower Body Dressing: Minimal assistance;Sit to/from stand               Functional mobility during ADLs: Min guard;Rolling walker General ADL Comments: Pt manages his  drains with foley catheter leg straps.     Vision     Perception     Praxis      Pertinent Vitals/Pain Pain Assessment: Faces Faces Pain Scale: Hurts little more Pain Location: L LE with ambulation Pain Descriptors / Indicators: Throbbing Pain Intervention(s): Monitored during session;Premedicated before session     Hand Dominance Right   Extremity/Trunk Assessment Upper Extremity Assessment Upper Extremity Assessment: Overall WFL for tasks assessed   Lower Extremity Assessment Lower Extremity Assessment: Defer to PT evaluation       Communication Communication Communication: No difficulties   Cognition Arousal/Alertness: Awake/alert Behavior During Therapy: WFL for tasks assessed/performed Overall Cognitive Status: Within Functional Limits for tasks assessed                     General Comments       Exercises       Shoulder Instructions      Home Living Family/patient expects to be discharged to:: Private residence Living Arrangements: Spouse/significant other;Children Available Help at Discharge: Available 24 hours/day Type of Home: House Home Access: Stairs to enter [employer] of Steps: 3 Entrance Stairs-Rails: None Home Layout: One level     Bathroom Shower/Tub: Teacher, early years/pre: Standard     Home Equipment: Environmental consultant - 2 wheels;Cane - single point          Prior Functioning/Environment Level of Independence: Independent  OT Problem List: Impaired balance (sitting and/or standing);Decreased activity tolerance;Pain;Decreased knowledge of use of DME or AE   OT Treatment/Interventions: Self-care/ADL training;DME and/or AE instruction;Patient/family education;Balance training;Therapeutic activities;Energy conservation    OT Goals(Current goals can be found in the care plan section) Acute Rehab OT Goals Patient Stated Goal: agreeable to work with therapies OT Goal Formulation: With  patient Time For Goal Achievement: 03/23/16 Potential to Achieve Goals: Good ADL Goals Pt Will Perform Grooming: with modified independence;standing Pt Will Perform Lower Body Bathing: with modified independence;sit to/from stand Pt Will Perform Lower Body Dressing: with modified independence;sit to/from stand Additional ADL Goal #1: Pt will employ energy conservation strategies in ADL and mobility independently.  OT Frequency: Min 2X/week   Barriers to D/C:            Co-evaluation PT/OT/SLP Co-Evaluation/Treatment: Yes Reason for Co-Treatment: For patient/therapist safety   OT goals addressed during session: ADL's and self-care      End of Session Equipment Utilized During Treatment: Rolling walker Nurse Communication: Mobility status  Activity Tolerance: Patient tolerated treatment well Patient left: in chair;with call bell/phone within reach;with family/visitor present   Time: 2158-7276 OT Time Calculation (min): 34 min Charges:  OT General Charges $OT Visit: 1 Procedure OT Evaluation $OT Eval Moderate Complexity: 1 Procedure G-Codes:    [name] 03/09/2016, 2:08 PM  [phone]

## 2016-03-09 NOTE — Progress Notes (Signed)
Subjective: Left leg hurts   Objective: Vital signs in last 24 hours: Temp:  [97.9 F (36.6 C)-98.6 F (37 C)] 98 F (36.7 C) (11/08 0800) Pulse Rate:  [69-88] 84 (11/08 0800) Resp:  [10-23] 14 (11/08 0800) BP: (105-136)/(50-110) 121/96 (11/08 0800) SpO2:  [92 %-98 %] 92 % (11/08 0800) Last BM Date: 03/07/16  Intake/Output from previous day: 11/07 0701 - 11/08 0700 In: 2618 [P.O.:1560; I.V.:3; IV Piggyback:1050] Out: 1650 [Urine:1475; Drains:175] Intake/Output this shift: Total I/O In: 3 [I.V.:3] Out: 450 [Urine:450]  Incision/Wound:left leg erythema more so today No crepeitence but extending toward groin  No fever   Lab Results:   Recent Labs  03/08/16 0224 03/09/16 0451  WBC 7.0 6.2  HGB 9.9* 10.1*  HCT 30.6* 32.4*  PLT 363 409*   BMET  Recent Labs  03/08/16 0224 03/09/16 0452  NA 137 136  K 3.5 3.7  CL 99* 101  CO2 28 27  GLUCOSE 125* 97  BUN 5* 7  CREATININE 0.81 0.80  CALCIUM 8.0* 8.1*   PT/INR No results for input(s): LABPROT, INR in the last 72 hours. ABG No results for input(s): PHART, HCO3 in the last 72 hours.  Invalid input(s): PCO2, PO2  Studies/Results: Ct Hip Left W Contrast  Result Date: 03/07/2016 CLINICAL DATA:  Patient with history renal cell and rectal carcinoma. Left hip pain and swelling. EXAM: CT OF THE LEFT HIP WITH CONTRAST TECHNIQUE: Multidetector CT imaging was performed following the standard protocol during bolus administration of intravenous contrast. CONTRAST:  100 ml ISOVUE-300 IOPAMIDOL (ISOVUE-300) INJECTION 61% COMPARISON:  CT abdomen and pelvis 03/04/2016. FINDINGS: Presacral abscess with a drainage catheter in place is partially imaged. Stranding in subcutaneous fatty tissues about the left buttock and hip has worsened since the prior CT scan. The left gluteus maximus muscle appears edematous. The muscles only partially imaged but no focal fluid collection is seen within the muscle. There are a few tiny locules of  gas within the muscle. The left hip is located. No fracture or focal bone lesion is identified. No hip joint effusion is seen. Limited visualization of the symphysis pubis and left SI joint are unremarkable. IMPRESSION: Increased stranding in subcutaneous fat about the left buttock and hip consistent with worsened cellulitis. The left gluteal musculature is incompletely imaged but there are a few tiny locules of gas within the gluteus maximus compatible with pyomyositis. No intramuscular abscess is visualized on this examination. Presacral abscess with a drainage catheter in place. Electronically Signed   By: Inge Rise M.D.   On: 03/07/2016 20:26   Ir Nephrostomy Exchange Left  Result Date: 03/08/2016 INDICATION: History rectal cancer, urinary bladder injury, persistent urine leak. Nephrostomy tubes for urinary diversion. EXAM: IR EXCHANGE NEPHROSTOMY LEFT; IR EXCHANGE NEPHROSTOMY RIGHT COMPARISON:  01/27/2016 MEDICATIONS: Patient is already receiving IV antibiotics as an inpatient ANESTHESIA/SEDATION: Fentanyl 150 mcg IV Moderate Sedation Time:  None. The patient was continuously monitored during the procedure by the interventional radiology nurse under my direct supervision. CONTRAST:  10 cc - administered into the collecting system(s) FLUOROSCOPY TIME:  Fluoroscopy Time: 1 minutes 54 seconds (9 mGy). COMPLICATIONS: None immediate. PROCEDURE: Informed written consent was obtained from the patient after a thorough discussion of the procedural risks, benefits and alternatives. All questions were addressed. Maximal Sterile Barrier Technique was utilized including caps, mask, sterile gowns, sterile gloves, sterile drape, hand hygiene and skin antiseptic. A timeout was performed prior to the initiation of the procedure. Under sterile conditions and local anesthesia, the  existing bilateral nephrostomy catheters were cut and removed over Amplatz guidewires. New 10 French nephrostomies were advanced into the  collecting systems bilaterally. Retention loops formed in the renal pelvis. Contrast injection confirms position. Catheter secured with Prolene sutures and connected to external gravity drainage bags. Sterile dressing applied. No immediate complication. Patient tolerated the procedure well. IMPRESSION: Successful fluoroscopic bilateral nephrostomy exchanges (10 [place]) Electronically Signed   By: Jerilynn Mages.  Shick M.D.   On: 03/08/2016 15:36   Ir Nephrostomy Exchange Right  Result Date: 03/08/2016 INDICATION: History rectal cancer, urinary bladder injury, persistent urine leak. Nephrostomy tubes for urinary diversion. EXAM: IR EXCHANGE NEPHROSTOMY LEFT; IR EXCHANGE NEPHROSTOMY RIGHT COMPARISON:  01/27/2016 MEDICATIONS: Patient is already receiving IV antibiotics as an inpatient ANESTHESIA/SEDATION: Fentanyl 150 mcg IV Moderate Sedation Time:  None. The patient was continuously monitored during the procedure by the interventional radiology nurse under my direct supervision. CONTRAST:  10 cc - administered into the collecting system(s) FLUOROSCOPY TIME:  Fluoroscopy Time: 1 minutes 54 seconds (9 mGy). COMPLICATIONS: None immediate. PROCEDURE: Informed written consent was obtained from the patient after a thorough discussion of the procedural risks, benefits and alternatives. All questions were addressed. Maximal Sterile Barrier Technique was utilized including caps, mask, sterile gowns, sterile gloves, sterile drape, hand hygiene and skin antiseptic. A timeout was performed prior to the initiation of the procedure. Under sterile conditions and local anesthesia, the existing bilateral nephrostomy catheters were cut and removed over Amplatz guidewires. New 10 French nephrostomies were advanced into the collecting systems bilaterally. Retention loops formed in the renal pelvis. Contrast injection confirms position. Catheter secured with Prolene sutures and connected to external gravity drainage bags. Sterile dressing  applied. No immediate complication. Patient tolerated the procedure well. IMPRESSION: Successful fluoroscopic bilateral nephrostomy exchanges (10 [place]) Electronically Signed   By: Jerilynn Mages.  Shick M.D.   On: 03/08/2016 15:36    Anti-infectives: Anti-infectives    Start     Dose/Rate Route Frequency Ordered Stop   03/08/16 2100  vancomycin (VANCOCIN) IVPB 1000 mg/200 mL premix     1,000 mg 200 mL/hr over 60 Minutes Intravenous Every 8 hours 03/08/16 1003     03/08/16 1015  vancomycin (VANCOCIN) 2,000 mg in sodium chloride 0.9 % 500 mL IVPB     2,000 mg 250 mL/hr over 120 Minutes Intravenous  Once 03/08/16 1002 03/08/16 1443   03/06/16 0815  piperacillin-tazobactam (ZOSYN) IVPB 3.375 g     3.375 g 12.5 mL/hr over 240 Minutes Intravenous Every 8 hours 03/06/16 0802     03/05/16 2300  clindamycin (CLEOCIN) IVPB 600 mg  Status:  Discontinued     600 mg 100 mL/hr over 30 Minutes Intravenous Every 8 hours 03/04/16 2236 03/05/16 1309   03/04/16 1615  piperacillin-tazobactam (ZOSYN) IVPB 3.375 g     3.375 g 100 mL/hr over 30 Minutes Intravenous  Once 03/04/16 1603 03/04/16 1641   03/04/16 1615  vancomycin (VANCOCIN) IVPB 1000 mg/200 mL premix     1,000 mg 200 mL/hr over 60 Minutes Intravenous  Once 03/04/16 1603 03/04/16 1735      Assessment/Plan: Cellulitis left hip  Drain site should be moved May need debridement but this wound would not heal  Agree with palliative care at this point  Few good options  Discussed with patient and family  Continue ABX   LOS: 5 days    Paralee Pendergrass A. 03/09/2016

## 2016-03-09 NOTE — Progress Notes (Signed)
[facility]   Reason for visit: Follow up on gluteal abscess, pyomyositis  Interval History: drains being replaced Day 5 pip/tazo, vancomycin day 2   Physical Exam: Constitutional:  Vitals:   03/09/16 0400 03/09/16 0800  BP: (!) 127/110 (!) 121/96  Pulse: 69 84  Resp:  14  Temp: 98.3 F (36.8 C) 98 F (36.7 C)  alert, moderate distress with pain Cardiovascular: RRR MS: left leg with increased edema, less erythema but more on the lower leg now Skin: no rashes  Review of Systems: Constitutional: negative for fevers and chills Gastrointestinal: negative for diarrhea Musculoskeletal: negative for muscle weakness  Lab Results  Component Value Date   WBC 6.2 03/09/2016   HGB 10.1 (L) 03/09/2016   HCT 32.4 (L) 03/09/2016   MCV 86.4 03/09/2016   PLT 409 (H) 03/09/2016    Lab Results  Component Value Date   CREATININE 0.80 03/09/2016   BUN 7 03/09/2016   NA 136 03/09/2016   K 3.7 03/09/2016   CL 101 03/09/2016   CO2 27 03/09/2016    Lab Results  Component Value Date   ALT 22 03/06/2016   AST 22 03/06/2016   ALKPHOS 111 03/06/2016     Microbiology: Recent Results (from the past 240 hour(s))  Culture, blood (Routine x 2)     Status: None   Collection Time: 03/04/16  4:16 PM  Result Value Ref Range Status   Specimen Description BLOOD RIGHT HAND  Final   Special Requests BOTTLES DRAWN AEROBIC AND ANAEROBIC 8CC EACH  Final   Culture NO GROWTH 5 DAYS  Final   Report Status 03/09/2016 FINAL  Final  Culture, blood (Routine x 2)     Status: None   Collection Time: 03/04/16  4:17 PM  Result Value Ref Range Status   Specimen Description LEFT ANTECUBITAL  Final   Special Requests BOTTLES DRAWN AEROBIC AND ANAEROBIC 6CC EACH  Final   Culture NO GROWTH 5 DAYS  Final   Report Status 03/09/2016 FINAL  Final  Urine culture     Status: Abnormal   Collection Time: 03/04/16  4:37 PM  Result Value Ref Range Status   Specimen Description URINE,  CLEAN CATCH  Final   Special Requests NONE  Final   Culture 70,000 COLONIES/mL YEAST (A)  Final   Report Status 03/06/2016 FINAL  Final  MRSA PCR Screening     Status: None   Collection Time: 03/04/16 10:37 PM  Result Value Ref Range Status   MRSA by PCR NEGATIVE NEGATIVE Final    Comment:        The GeneXpert MRSA Assay (FDA approved for NASAL specimens only), is one component of a comprehensive MRSA colonization surveillance program. It is not intended to diagnose MRSA infection nor to guide or monitor treatment for MRSA infections.   Culture, blood (x 2)     Status: None (Preliminary result)   Collection Time: 03/04/16 11:13 PM  Result Value Ref Range Status   Specimen Description BLOOD RIGHT ARM  Final   Special Requests BOTTLES DRAWN AEROBIC AND ANAEROBIC 10ML  Final   Culture NO GROWTH 4 DAYS  Final   Report Status PENDING  Incomplete  Culture, blood (x 2)     Status: None (Preliminary result)   Collection Time: 03/04/16 11:20 PM  Result Value Ref Range Status   Specimen Description BLOOD RIGHT HAND  Final   Special Requests AEROBIC BOTTLE ONLY 6ML  Final   Culture NO  GROWTH 4 DAYS  Final   Report Status PENDING  Incomplete  Aerobic/Anaerobic Culture (surgical/deep wound)     Status: None (Preliminary result)   Collection Time: 03/05/16 12:57 PM  Result Value Ref Range Status   Specimen Description ABSCESS ABDOMEN  Final   Special Requests Normal  Final   Gram Stain   Final    FEW WBC PRESENT, PREDOMINANTLY PMN ABUNDANT GRAM NEGATIVE COCCOBACILLI MODERATE GRAM POSITIVE COCCI IN PAIRS    Culture   Final    CULTURE REINCUBATED FOR BETTER GROWTH HOLDING FOR POSSIBLE ANAEROBE    Report Status PENDING  Incomplete    Impression/Plan:  1. Presacral Abscess - draining.  On zosyn and vancomycin.   2.  Pyomyositis - on antibiotics.  I am concerned that this infection is worsening and I recommend surgery reevaluate for ? Debridement.  It is getting more edematous and  painful.   2. Pain - palliative care seeing

## 2016-03-09 NOTE — Progress Notes (Addendum)
Patient ID: Andrew Kline, male   DOB: 19-Mar-1966, 50 y.o.   MRN: [id number]  PROGRESS NOTE    Andrew Kline  VFI:[id number] DOB: Sep 30, 1965 DOA: 03/04/2016  PCP: [name], MD   Brief Narrative:  50 year old male with past medical history of metastatic rectal cancer status post colostomy, renal cell carcinoma of the right kidney, status post partial nephrectomy with bilateral nephrostomy tubes and prior presacral abscesses requiring admission 9/25-10/1 who presented with recurrent infection.  Patient was seen at [facility] by general surgery who felt no surgical intervention was needed at that time and that patient will require percutaneous drainage via interventional radiology. Patient underwent CT-guided left transgluteal drain placement with fecal fluid aspirated and culture sent due to concern for fistula to bladder or bowel. General surgery and ID have seen the pt in consultation.  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 Patient has a very complicated GU system with bilateral nephrostomy tubes, suprapubic catheter, colostomy secondary to his metastatic cancer. Patient also noted to have cellulitis/pyomyositis of the left buttocks and upper thigh per CT scan. Antibiotic coverage has been extended to include vancomycin in addition to him being already on zosyn.  Palliative care consulted for goals of care and pain management.    Assessment & Plan:  Sepsis secondary to presacral abscess, status post transgluteal drain placement / Cellulitis and pyomyositis of gluteus maximus and left hip and upper thigh - Sepsis criteria met on the admission with leukocytosis, fever, tachycardia, elevated lactic acid of 3.4 and hypotension - Sepsis protocol initiated - CT abdomen and pelvis which was abnormal did show a increasing size of presacral collection now at 6.2 x 5.9 x 9.2 cm newly containing gas. - Source of sepsis is  presacral abscess for which patient underwent a transgluteal drain placement 03/05/2016. This was done by interventional radiology. He continues to have erythema around the drain in Compazine entire buttock and down the thigh and the drain itself appears to be draining urine which is consistent with fistula. Will follow-up with surgery if the recommendation is to repeat move the drain considering the cellulitis  - Patient is on vancomycin and Zosyn.  - The culture from the abscess was re-incubated for better growth  - Blood cultures negative so far  - Infectious disease team is following  - Continue pain management efforts  Hyponatremia - Likely secondary to hypovolemic hyponatremia - Sodium level normalized with hydration   History of rectal cancer/renal cell carcinoma status post bilateral nephrostomy tubes - Patient with history of metastatic rectal cancer status post colostomy, renal cell carcinoma of the right kidney status post partial nephrectomy with bilateral nephrostomy tubes - CT abdomen and pelvis with pulmonary metastases and possible liver metastases - Outpt follow up with oncology   Anemia of chronic disease - Secondary to history of malignancy - Hemoglobin stable at 10.1  Depression - Continue Cymbalta and amitriptyline - Continue Lyrica - Stable mental status   Yeast UTI - Urine cultures with 70,000 colonies of yeast.    DVT prophylaxis: Lovenox Code Status: Full Family Communication: Updated patient. No family at bedside. Offer to update pt sig otter but pt said that he will call Disposition Plan:  anticipated discharge in the next 2-3 days if medically stable and his pain is controlled   Consultants:   Interventional radiology Dr. Annamaria Boots 03/05/2016  Infectious diseases Dr. Baxter Flattery 03/05/2016  General surgery: Dr. Barry Dienes 03/06/2016  Wound care  Palliative  care  Procedures:   CT abdomen and pelvis 03/04/2016  Chest x-ray 03/04/2016  CT-guided  left transgluteal pelvic drain placement with 50 mL of exudative, fecal fluid aspirated per Dr. Annamaria Boots 03/05/2016  CT left hip 03/07/2016  Antimicrobials:   IV Zosyn 03/04/2016 1 dose.  IV vancomycin 03/04/2016 1 dose  IV clindamycin 03/05/2016>>> 03/05/2016  IV Zosyn 03/06/2016  IV vancomycin 03/08/2016    Subjective: Pain is 10/10 in intensity over the left buttock and radiating to left leg.   Objective: Vitals:   03/08/16 2000 03/09/16 0018 03/09/16 0400 03/09/16 0800  BP: 110/74 117/66 (!) 127/110 (!) 121/96  Pulse: 84 73 69 84  Resp: (!) '23 13  14  ' Temp:  97.9 F (36.6 C) 98.3 F (36.8 C) 98 F (36.7 C)  TempSrc:  Oral Oral Oral  SpO2: 96% 98% 93% 92%  Weight:      Height:        Intake/Output Summary (Last 24 hours) at 03/09/16 0919 Last data filed at 03/09/16 0850  Gross per 24 hour  Intake             2373 ml  Output             2100 ml  Net              273 ml   Filed Weights   03/04/16 1544 03/04/16 2228  Weight: 81.6 kg (180 lb) 98.9 kg (218 lb)    Examination:  General exam: Appears calm and comfortable  Respiratory system: Clear to auscultation. Respiratory effort normal. Cardiovascular system: S1 & S2 heard, RRR. No JVD, murmurs, rubs, gallops or clicks. No pedal edema. Gastrointestinal system: Abdomen is nondistended, soft and nontender. He does have perc drainage, bilateral nephrostomy and suprapubic catheter  Central nervous system: Alert and oriented. No focal neurological deficits. Extremities: LLE redness laterally, palpable pulses; pain in left leg and has very difficult time lifting LLLE Skin: other than redness on LLE no other rah or ulcers  Psychiatry: Judgement and insight appear normal. Mood & affect appropriate.   Data Reviewed: I have personally reviewed following labs and imaging studies  CBC:  Recent Labs Lab 03/04/16 1616 03/05/16 0122 03/06/16 0121 03/07/16 0319 03/08/16 0224 03/09/16 0451  WBC 18.0* 13.3* 9.7  8.6 7.0 6.2  NEUTROABS 15.3*  --  8.4* 7.1 5.2 4.4  HGB 13.5 10.8* 10.4* 10.0* 9.9* 10.1*  HCT 39.8 32.5* 31.8* 30.9* 30.6* 32.4*  MCV 83.8 83.8 84.8 84.0 85.2 86.4  PLT 288 230 293 320 363 025*   Basic Metabolic Panel:  Recent Labs Lab 03/05/16 0122 03/06/16 0121 03/07/16 0319 03/08/16 0224 03/09/16 0452  NA 133* 134* 134* 137 136  K 3.6 3.6 3.3* 3.5 3.7  CL 104 100* 100* 99* 101  CO2 '22 26 27 28 27  ' GLUCOSE 145* 141* 108* 125* 97  BUN 8 10 5* 5* 7  CREATININE 0.91 0.86 0.75 0.81 0.80  CALCIUM 7.1* 7.8* 7.8* 8.0* 8.1*  MG  --  1.8  --   --   --   PHOS  --   --   --   --  3.7   GFR: Estimated Creatinine Clearance: 116.9 mL/min (by C-G formula based on SCr of 0.8 mg/dL). Liver Function Tests:  Recent Labs Lab 03/04/16 1616 03/06/16 0121 03/09/16 0452  AST 40 22  --   ALT 33 22  --   ALKPHOS 154* 111  --   BILITOT 1.3* 0.5  --  PROT 7.2 5.4*  --   ALBUMIN 3.1* 1.9* 2.0*   No results for input(s): LIPASE, AMYLASE in the last 168 hours. No results for input(s): AMMONIA in the last 168 hours. Coagulation Profile:  Recent Labs Lab 03/04/16 2313  INR 1.33   Cardiac Enzymes: No results for input(s): CKTOTAL, CKMB, CKMBINDEX, TROPONINI in the last 168 hours. BNP (last 3 results) No results for input(s): PROBNP in the last 8760 hours. HbA1C: No results for input(s): HGBA1C in the last 72 hours. CBG: No results for input(s): GLUCAP in the last 168 hours. Lipid Profile: No results for input(s): CHOL, HDL, LDLCALC, TRIG, CHOLHDL, LDLDIRECT in the last 72 hours. Thyroid Function Tests: No results for input(s): TSH, T4TOTAL, FREET4, T3FREE, THYROIDAB in the last 72 hours. Anemia Panel: No results for input(s): VITAMINB12, FOLATE, FERRITIN, TIBC, IRON, RETICCTPCT in the last 72 hours. Urine analysis:    Component Value Date/Time   COLORURINE YELLOW 03/04/2016 1637   APPEARANCEUR CLEAR 03/04/2016 1637   LABSPEC 1.015 03/04/2016 1637   PHURINE 6.0 03/04/2016  1637   GLUCOSEU NEGATIVE 03/04/2016 1637   HGBUR LARGE (A) 03/04/2016 1637   BILIRUBINUR NEGATIVE 03/04/2016 1637   KETONESUR NEGATIVE 03/04/2016 1637   PROTEINUR 100 (A) 03/04/2016 1637   UROBILINOGEN 0.2 03/17/2015 0910   NITRITE NEGATIVE 03/04/2016 1637   LEUKOCYTESUR LARGE (A) 03/04/2016 1637   Sepsis Labs: '@[email or web address]' (procalcitonin:4,lacticidven:4)   Culture, blood (Routine x 2)     Status: None   Collection Time: 03/04/16  4:16 PM  Result Value Ref Range Status   Specimen Description BLOOD RIGHT HAND  Final   Special Requests BOTTLES DRAWN AEROBIC AND ANAEROBIC 8CC EACH  Final   Culture NO GROWTH 5 DAYS  Final   Report Status 03/09/2016 FINAL  Final  Culture, blood (Routine x 2)     Status: None   Collection Time: 03/04/16  4:17 PM  Result Value Ref Range Status   Specimen Description LEFT ANTECUBITAL  Final   Special Requests BOTTLES DRAWN AEROBIC AND ANAEROBIC [facility] EACH  Final   Culture NO GROWTH 5 DAYS  Final   Report Status 03/09/2016 FINAL  Final  Urine culture     Status: Abnormal   Collection Time: 03/04/16  4:37 PM  Result Value Ref Range Status   Specimen Description URINE, CLEAN CATCH  Final   Special Requests NONE  Final   Culture 70,000 COLONIES/mL YEAST (A)  Final   Report Status 03/06/2016 FINAL  Final  MRSA PCR Screening     Status: None   Collection Time: 03/04/16 10:37 PM  Result Value Ref Range Status   MRSA by PCR NEGATIVE NEGATIVE Final  Culture, blood (x 2)     Status: None (Preliminary result)   Collection Time: 03/04/16 11:13 PM  Result Value Ref Range Status   Specimen Description BLOOD RIGHT ARM  Final   Special Requests BOTTLES DRAWN AEROBIC AND ANAEROBIC 10ML  Final   Culture NO GROWTH 3 DAYS  Final   Report Status PENDING  Incomplete  Culture, blood (x 2)     Status: None (Preliminary result)   Collection Time: 03/04/16 11:20 PM  Result Value Ref Range Status   Specimen Description BLOOD RIGHT HAND  Final   Special Requests AEROBIC  BOTTLE ONLY 6ML  Final   Culture NO GROWTH 3 DAYS  Final   Report Status PENDING  Incomplete  Aerobic/Anaerobic Culture (surgical/deep wound)     Status: None (Preliminary result)   Collection Time: 03/05/16 12:57 PM  Result Value Ref Range Status   Specimen Description ABSCESS ABDOMEN  Final   Special Requests Normal  Final   Gram Stain   Final    FEW WBC PRESENT, PREDOMINANTLY PMN ABUNDANT GRAM NEGATIVE COCCOBACILLI MODERATE GRAM POSITIVE COCCI IN PAIRS    Culture   Final    CULTURE REINCUBATED FOR BETTER GROWTH HOLDING FOR POSSIBLE ANAEROBE    Report Status PENDING  Incomplete      Radiology Studies: Ct Hip Left W Contrast Result Date: 03/07/2016 Increased stranding in subcutaneous fat about the left buttock and hip consistent with worsened cellulitis. The left gluteal musculature is incompletely imaged but there are a few tiny locules of gas within the gluteus maximus compatible with pyomyositis. No intramuscular abscess is visualized on this examination. Presacral abscess with a drainage catheter in place. Electronically Signed   By: Inge Rise M.D.   On: 03/07/2016 20:26   Ir Nephrostomy Exchange Left Result Date: 03/08/2016 Successful fluoroscopic bilateral nephrostomy exchanges ([place]) Electronically Signed   By: Jerilynn Mages.  Shick M.D.   On: 03/08/2016 15:36   Ir Nephrostomy Exchange Right Result Date: 03/08/2016 Successful fluoroscopic bilateral nephrostomy exchanges ([place]) Electronically Signed   By: Jerilynn Mages.  Shick M.D.   On: 03/08/2016 15:36   Ct Image Guided Drainage By Percutaneous Cathete Result Date: 03/05/2016 Successful CT-guided left trans gluteal presacral abscess drain insertion. Dilute contrast within the abscess cavity from the recent CT, suspicious for fistula from either the bowel or bladder. Electronically Signed   By: Jerilynn Mages.  Shick M.D.   On: 03/05/2016 13:06     Scheduled Meds: . amitriptyline  50 mg Oral QHS  . DULoxetine  60 mg Oral Daily  .  enoxaparin (LOVENOX) injection  40 mg Subcutaneous Q24H  . fentaNYL  150 mcg Transdermal Q72H  . piperacillin-tazobactam (ZOSYN)  IV  3.375 g Intravenous Q8H  . pregabalin  150 mg Oral BID  . senna-docusate  1 tablet Oral BID  . sodium chloride flush  3 mL Intravenous Q12H  . vancomycin  1,000 mg Intravenous Q8H   Continuous Infusions:   LOS: 5 days    Time spent: 25 minutes  Greater than 50% of the time spent on counseling and coordinating the care.   Leisa Lenz, MD [facility] Pager [phone]  If 7PM-7AM, please contact night-coverage [email or web address] Password [email or web address] 03/09/2016, 9:19 AM

## 2016-03-09 NOTE — Progress Notes (Signed)
Patient ID: Andrew Kline, male   DOB: 1966/04/14, 50 y.o.   MRN: [id number]    Referring Physician(s): Dr. 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 Authorizing Provider  albuterol (PROVENTIL HFA;VENTOLIN HFA) 108 (90 Base) MCG/ACT inhaler Inhale 2 puffs into the lungs every 6 (six) hours as needed for wheezing or shortness of breath. 05/26/15  Yes Patrici Ranks, MD  amitriptyline (ELAVIL) 50 MG tablet Take 1 tablet (50 mg total) by mouth at bedtime. 12/31/15  Yes Patrici Ranks, MD  docusate sodium (COLACE) 100 MG capsule Take 1 capsule (100 mg total) by mouth every 12 (twelve) hours. Patient taking differently: Take 100 mg by mouth daily as needed for mild constipation or moderate constipation.  09/25/14  Yes Christopher Lawyer, PA-C  DULoxetine (CYMBALTA) 60 MG capsule Take 1 capsule (60 mg total) by mouth daily. 12/31/15  Yes Patrici Ranks, MD  fentaNYL (DURAGESIC - DOSED MCG/HR) 75 MCG/HR Place 2 patches (150 mcg total) onto the skin every 3 (three) days. 02/01/16  Yes Manon Hilding Kefalas, PA-C  HYDROmorphone (DILAUDID) 4 MG tablet Take 1 tablet (4 mg total) by mouth every 4 (four) hours as needed for severe pain. 02/22/16  Yes Manon Hilding Kefalas, PA-C  pregabalin (LYRICA) 75 MG capsule Take 2 capsules (150 mg total) by mouth 2 (two) times daily. 12/31/15  Yes Patrici Ranks, MD  zolpidem (AMBIEN CR) 12.5 MG CR tablet Take 1 tablet (12.5 mg total) by mouth at bedtime as needed for sleep. 12/31/15  Yes Patrici Ranks, MD    Vital Signs: BP (!) 121/96   Pulse 84   Temp 98 F (36.7 C) (Oral)   Resp 14   Ht '5\' 5"'$  (1.651 m)   Wt 218 lb (98.9 kg) Comment:  Weight taken by RN  SpO2 92%   BMI 36.28 kg/m   Physical Exam: Abd: SP tube in place, s/p exchange earlier this week with clear yellow urine.  B PCNs in place with clear yellow urine as well.  All sites are c/d/i.  TG drain in place with darker yellow urine and some bloody fibrin in bag.  Still with significant erythema and edema of his left buttock and upper back, down his left thigh and leg. (L) PCN - 450cc  (R) PCN - 575cc  Suprapubic output is not documented yesterday (several hundred in bag presently)  TG drain with 175cc yesterday.  Imaging: Ct Hip Left W Contrast  Result Date: 03/07/2016 CLINICAL DATA:  Patient with history renal cell and rectal carcinoma. Left hip pain and swelling. EXAM: CT OF THE LEFT HIP WITH CONTRAST TECHNIQUE: Multidetector CT imaging was performed following the standard protocol during bolus administration of intravenous contrast. CONTRAST:  100 ml ISOVUE-300 IOPAMIDOL (ISOVUE-300) INJECTION 61% COMPARISON:  CT abdomen and pelvis 03/04/2016. FINDINGS: Presacral abscess with a drainage catheter in place is partially imaged. Stranding in subcutaneous fatty tissues about the left buttock and hip has worsened since the prior CT scan. The left gluteus maximus muscle appears edematous. The muscles only partially imaged but no focal fluid collection is seen within the muscle. There are a few tiny locules of gas within the muscle. The left hip is located. No fracture or  focal bone lesion is identified. No hip joint effusion is seen. Limited visualization of the symphysis pubis and left SI joint are unremarkable. IMPRESSION: Increased stranding in subcutaneous fat about the left buttock and hip consistent with worsened cellulitis. The left gluteal musculature is incompletely imaged but there are a few tiny locules of gas within the gluteus maximus compatible with pyomyositis. No intramuscular abscess is visualized on this examination. Presacral abscess with a drainage catheter in  place. Electronically Signed   By: Inge Rise M.D.   On: 03/07/2016 20:26   Ir Nephrostomy Exchange Left  Result Date: 03/08/2016 INDICATION: History rectal cancer, urinary bladder injury, persistent urine leak. Nephrostomy tubes for urinary diversion. EXAM: IR EXCHANGE NEPHROSTOMY LEFT; IR EXCHANGE NEPHROSTOMY RIGHT COMPARISON:  01/27/2016 MEDICATIONS: Patient is already receiving IV antibiotics as an inpatient ANESTHESIA/SEDATION: Fentanyl 150 mcg IV Moderate Sedation Time:  None. The patient was continuously monitored during the procedure by the interventional radiology nurse under my direct supervision. CONTRAST:  10 cc - administered into the collecting system(s) FLUOROSCOPY TIME:  Fluoroscopy Time: 1 minutes 54 seconds (9 mGy). COMPLICATIONS: None immediate. PROCEDURE: Informed written consent was obtained from the patient after a thorough discussion of the procedural risks, benefits and alternatives. All questions were addressed. Maximal Sterile Barrier Technique was utilized including caps, mask, sterile gowns, sterile gloves, sterile drape, hand hygiene and skin antiseptic. A timeout was performed prior to the initiation of the procedure. Under sterile conditions and local anesthesia, the existing bilateral nephrostomy catheters were cut and removed over Amplatz guidewires. New 10 French nephrostomies were advanced into the collecting systems bilaterally. Retention loops formed in the renal pelvis. Contrast injection confirms position. Catheter secured with Prolene sutures and connected to external gravity drainage bags. Sterile dressing applied. No immediate complication. Patient tolerated the procedure well. IMPRESSION: Successful fluoroscopic bilateral nephrostomy exchanges ([place]) Electronically Signed   By: Jerilynn Mages.  Shick M.D.   On: 03/08/2016 15:36   Ir Nephrostomy Exchange Right  Result Date: 03/08/2016 INDICATION: History rectal cancer, urinary bladder injury, persistent urine leak.  Nephrostomy tubes for urinary diversion. EXAM: IR EXCHANGE NEPHROSTOMY LEFT; IR EXCHANGE NEPHROSTOMY RIGHT COMPARISON:  01/27/2016 MEDICATIONS: Patient is already receiving IV antibiotics as an inpatient ANESTHESIA/SEDATION: Fentanyl 150 mcg IV Moderate Sedation Time:  None. The patient was continuously monitored during the procedure by the interventional radiology nurse under my direct supervision. CONTRAST:  10 cc - administered into the collecting system(s) FLUOROSCOPY TIME:  Fluoroscopy Time: 1 minutes 54 seconds (9 mGy). COMPLICATIONS: None immediate. PROCEDURE: Informed written consent was obtained from the patient after a thorough discussion of the procedural risks, benefits and alternatives. All questions were addressed. Maximal Sterile Barrier Technique was utilized including caps, mask, sterile gowns, sterile gloves, sterile drape, hand hygiene and skin antiseptic. A timeout was performed prior to the initiation of the procedure. Under sterile conditions and local anesthesia, the existing bilateral nephrostomy catheters were cut and removed over Amplatz guidewires. New 10 French nephrostomies were advanced into the collecting systems bilaterally. Retention loops formed in the renal pelvis. Contrast injection confirms position. Catheter secured with Prolene sutures and connected to external gravity drainage bags. Sterile dressing applied. No immediate complication. Patient tolerated the procedure well. IMPRESSION: Successful fluoroscopic bilateral nephrostomy exchanges ([place]) Electronically Signed   By: Jerilynn Mages.  Shick M.D.   On: 03/08/2016 15:36   Ct Image Guided Drainage By Percutaneous Catheter  Result Date: 03/05/2016 INDICATION: Fever, elevated white count, recurrent presacral pelvic abscess. 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 EXAM: CT  GUIDED DRAINAGE OF RECURRENT PRESACRAL PELVIC ABSCESS MEDICATIONS: The patient is currently admitted to the hospital and receiving intravenous antibiotics. The  antibiotics were administered within an appropriate time frame prior to the initiation of the procedure. ANESTHESIA/SEDATION: 2.0 mg IV Versed 200 mcg IV Fentanyl Moderate Sedation Time:  14 MINUTES The patient was continuously monitored during the procedure by the interventional radiology nurse under my direct supervision. COMPLICATIONS: None immediate. TECHNIQUE: Informed written consent was obtained from the patient after a thorough discussion of the procedural risks, benefits and alternatives. All questions were addressed. Maximal Sterile Barrier Technique was utilized including caps, mask, sterile gowns, sterile gloves, sterile drape, hand hygiene and skin antiseptic. A timeout was performed prior to the initiation of the procedure. PROCEDURE: Previous imaging reviewed. Patient positioned prone. Noncontrast localization CT performed. The presacral pelvic abscess was localized. Abscess cavity now containing dilute contrast, suspect from fistula the bladder or bowel The left gluteal region was prepped with ChloraPrep in a sterile fashion, and a sterile drape was applied covering the operative field. A sterile gown and sterile gloves were used for the procedure. Local anesthesia was provided with 1% Lidocaine. Under sterile conditions and local anesthesia, an 18 gauge 10 cm access needle was advanced from a left trans gluteal approach into the fluid collection. Needle position confirmed. Syringe aspiration yielded fecal contaminated fluid. Guidewire inserted followed by tract dilatation to advance a 10 [place] drain. Drain catheter position confirmed with CT. Syringe aspiration yielded 50 cc purulent fecal contaminated fluid. Catheter secured with a Prolene suture and connected to external gravity drainage bag. Sterile dressing applied. No immediate complication. Patient tolerated the procedure well. FINDINGS: CT imaging confirms needle access of the presacral abscess for drain insertion. IMPRESSION: Successful  CT-guided left trans gluteal presacral abscess drain insertion. Dilute contrast within the abscess cavity from the recent CT, suspicious for fistula from either the bowel or bladder. Electronically Signed   By: Jerilynn Mages.  Shick M.D.   On: 03/05/2016 13:06    Labs:  CBC:  Recent Labs  03/06/16 0121 03/07/16 0319 03/08/16 0224 03/09/16 0451  WBC 9.7 8.6 7.0 6.2  HGB 10.4* 10.0* 9.9* 10.1*  HCT 31.8* 30.9* 30.6* 32.4*  PLT 293 320 363 409*    COAGS:  Recent Labs  09/23/15 0900 11/12/15 0945 01/26/16 1540 03/04/16 2313  INR 1.15 1.12 1.00 1.33  APTT 38*  --   --  41*    BMP:  Recent Labs  03/06/16 0121 03/07/16 0319 03/08/16 0224 03/09/16 0452  NA 134* 134* 137 136  K 3.6 3.3* 3.5 3.7  CL 100* 100* 99* 101  CO2 '26 27 28 27  '$ GLUCOSE 141* 108* 125* 97  BUN 10 5* 5* 7  CALCIUM 7.8* 7.8* 8.0* 8.1*  CREATININE 0.86 0.75 0.81 0.80  GFRNONAA >60 >60 >60 >60  GFRAA >60 >60 >60 >60    LIVER FUNCTION TESTS:  Recent Labs  02/09/16 0854 02/23/16 0915 03/04/16 1616 03/06/16 0121 03/09/16 0452  BILITOT 0.2* 0.3 1.3* 0.5  --   AST 18 18 40 22  --   ALT 13* 17 33 22  --   ALKPHOS 99 87 154* 111  --   PROT 7.4 5.8* 7.2 5.4*  --   ALBUMIN 3.5 2.9* 3.1* 1.9* 2.0*    Assessment and Plan: 1. Presacral abscess, s/p TG drain placement on 11/4 -significant erythema around this drain encompassing his entire buttock cheek and down his thigh.  The drain itself appears to be draining urine, c/w  thoughts of a fistula -would not recommend removal of this drain despite erythema as he is having urine output in this drain.  If we remove this the pelvic fluid collection will recur.   2. S/p B PCN drain exchanges 11/7 - Shick -in good position and draining well 3. S/p suprapubic drain exchange 11/6 -cont suprapubic drain in place for bladder decompression   Electronically Signed: Asher Torpey E 03/09/2016, 12:09 PM   I spent a total of 15 Minutes at the the patient's bedside AND  on the patient's hospital floor or unit, greater than 50% of which was counseling/coordinating care for pelvic fluid collection, PCNs, suprapubic catheter management

## 2016-03-10 ENCOUNTER — Other Ambulatory Visit (HOSPITAL_COMMUNITY): Payer: Medicaid Other

## 2016-03-10 DIAGNOSIS — Z7189 Other specified counseling: Secondary | ICD-10-CM

## 2016-03-10 LAB — CULTURE, BLOOD (ROUTINE X 2)
CULTURE: NO GROWTH
CULTURE: NO GROWTH

## 2016-03-10 LAB — BASIC METABOLIC PANEL
Anion gap: 5 (ref 5–15)
BUN: 6 mg/dL (ref 6–20)
CALCIUM: 7.9 mg/dL — AB (ref 8.9–10.3)
CO2: 31 mmol/L (ref 22–32)
CREATININE: 0.9 mg/dL (ref 0.61–1.24)
Chloride: 104 mmol/L (ref 101–111)
GFR calc non Af Amer: >60 mL/min (ref >60)
Glucose, Bld: 96 mg/dL (ref 65–99)
Potassium: 3.7 mmol/L (ref 3.5–5.1)
SODIUM: 140 mmol/L (ref 135–145)

## 2016-03-10 LAB — CBC
HCT: 31.9 % — ABNORMAL LOW (ref 39.0–52.0)
Hemoglobin: 10.1 g/dL — ABNORMAL LOW (ref 13.0–17.0)
MCH: 27.4 pg (ref 26.0–34.0)
MCHC: 31.7 g/dL (ref 30.0–36.0)
MCV: 86.4 fL (ref 78.0–100.0)
Platelets: 420 10*3/uL — ABNORMAL HIGH (ref 150–400)
RBC: 3.69 MIL/uL — ABNORMAL LOW (ref 4.22–5.81)
RDW: 20.1 % — AB (ref 11.5–15.5)
WBC: 5.8 10*3/uL (ref 4.0–10.5)

## 2016-03-10 LAB — AEROBIC/ANAEROBIC CULTURE W GRAM STAIN (SURGICAL/DEEP WOUND): Special Requests: NORMAL

## 2016-03-10 LAB — VANCOMYCIN, TROUGH: VANCOMYCIN TR: 23 ug/mL — AB (ref 15–20)

## 2016-03-10 LAB — AEROBIC/ANAEROBIC CULTURE (SURGICAL/DEEP WOUND)

## 2016-03-10 MED ORDER — HYDROMORPHONE 1 MG/ML IV SOLN
INTRAVENOUS | Status: DC
  Administered 2016-03-10: 1.3 mg via INTRAVENOUS
  Administered 2016-03-10: 2.25 mg via INTRAVENOUS
  Administered 2016-03-11: 1.09 mg via INTRAVENOUS
  Administered 2016-03-11: 0.798 mg via INTRAVENOUS
  Administered 2016-03-11: 0.837 mg via INTRAVENOUS

## 2016-03-10 MED ORDER — VANCOMYCIN HCL 10 G IV SOLR
1500.0000 mg | Freq: Two times a day (BID) | INTRAVENOUS | Status: DC
  Filled 2016-03-10: qty 1500

## 2016-03-10 MED ORDER — VANCOMYCIN HCL 10 G IV SOLR
1250.0000 mg | Freq: Two times a day (BID) | INTRAVENOUS | Status: DC
  Administered 2016-03-10 – 2016-03-15 (×10): 1250 mg via INTRAVENOUS
  Filled 2016-03-10 (×11): qty 1250

## 2016-03-10 NOTE — Progress Notes (Signed)
Patient frequently awakened from sleep due to PCA monitoring equipment alarming for low RR. Patient awakens when spoken to but orientation has decreased today. Patient not aware of the time/day. Often not finishing sentences on topic. Palliative Care NP made aware. Will continue to monitor patient closely.

## 2016-03-10 NOTE — Progress Notes (Signed)
Patient ID: Megan Hayduk, male   DOB: 1965-05-07, 50 y.o.   MRN: [id number] Due to persistent cellulitis of his left buttock down his leg, surgery has requested we remove his transgluteal drain to see if this will help with resolution of his cellulitis.  I have discussed this with Dr. Kathlene Cote.  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  They were ok with this possibility as to hopefully avoid surgical debridement of his skin on the left side that would certainly cause many issues for the patient.  We are unfortunately not able to go ahead and place another drain on the right as the cavity is decompressed right now.  He will have to be monitored and if a recurrent collection develops then we will proceed with the patient's wishes at that time.  This was discussed with the patient prior to drain removal and he understands and is ok to proceed.  Drain removed with no complications.  Daxton Nydam E 03/10/2016 4:04 PM

## 2016-03-10 NOTE — Progress Notes (Signed)
Patient ID: Andrew Kline, male   DOB: [birth year], 50 y.o.   MRN: [id number]    Referring Physician(s): Dr. Irine Seal  Supervising Physician: Aletta Edouard  Patient Status: [facility] - In-pt  Chief Complaint: Pelvic fluid collection  Subjective: Patient is sleeping.  On PCA.  Barely awoke while I was in the room and complained of being tired.  Allergies: Oxaliplatin  Medications: Prior to Admission medications   Medication Sig Start Date End Date Taking? Authorizing Provider  albuterol (PROVENTIL HFA;VENTOLIN HFA) 108 (90 Base) MCG/ACT inhaler Inhale 2 puffs into the lungs every 6 (six) hours as needed for wheezing or shortness of breath. 05/26/15  Yes Patrici Ranks, MD  amitriptyline (ELAVIL) 50 MG tablet Take 1 tablet (50 mg total) by mouth at bedtime. 12/31/15  Yes Patrici Ranks, MD  docusate sodium (COLACE) 100 MG capsule Take 1 capsule (100 mg total) by mouth every 12 (twelve) hours. Patient taking differently: Take 100 mg by mouth daily as needed for mild constipation or moderate constipation.  09/25/14  Yes Christopher Lawyer, PA-C  DULoxetine (CYMBALTA) 60 MG capsule Take 1 capsule (60 mg total) by mouth daily. 12/31/15  Yes Patrici Ranks, MD  fentaNYL (DURAGESIC - DOSED MCG/HR) 75 MCG/HR Place 2 patches (150 mcg total) onto the skin every 3 (three) days. 02/01/16  Yes Manon Hilding Kefalas, PA-C  HYDROmorphone (DILAUDID) 4 MG tablet Take 1 tablet (4 mg total) by mouth every 4 (four) hours as needed for severe pain. 02/22/16  Yes Manon Hilding Kefalas, PA-C  pregabalin (LYRICA) 75 MG capsule Take 2 capsules (150 mg total) by mouth 2 (two) times daily. 12/31/15  Yes Patrici Ranks, MD  zolpidem (AMBIEN CR) 12.5 MG CR tablet Take 1 tablet (12.5 mg total) by mouth at bedtime as needed for sleep. 12/31/15  Yes Patrici Ranks, MD    Vital Signs: BP 109/75 (BP Location: Right Arm)   Pulse 62   Temp 98.4 F (36.9 C) (Axillary)   Resp (!) 21   Ht '5\' 5"'$  (1.651 m)   Wt 218 lb  (98.9 kg) Comment: Weight taken by RN  SpO2 91%   BMI 36.28 kg/m   Physical Exam: Abd: all drains on in good position and all are draining urine.  Pelvic drain with 110cc of urine output, which is decreasing each day.  All other drains total 1675cc.   Ext: left buttock, thigh, and calf still with erythema, edema, and very tender  Imaging: Ct Hip Left W Contrast  Result Date: 03/07/2016 CLINICAL DATA:  Patient with history renal cell and rectal carcinoma. Left hip pain and swelling. EXAM: CT OF THE LEFT HIP WITH CONTRAST TECHNIQUE: Multidetector CT imaging was performed following the standard protocol during bolus administration of intravenous contrast. CONTRAST:  100 ml ISOVUE-300 IOPAMIDOL (ISOVUE-300) INJECTION 61% COMPARISON:  CT abdomen and pelvis 03/04/2016. FINDINGS: Presacral abscess with a drainage catheter in place is partially imaged. Stranding in subcutaneous fatty tissues about the left buttock and hip has worsened since the prior CT scan. The left gluteus maximus muscle appears edematous. The muscles only partially imaged but no focal fluid collection is seen within the muscle. There are a few tiny locules of gas within the muscle. The left hip is located. No fracture or focal bone lesion is identified. No hip joint effusion is seen. Limited visualization of the symphysis pubis and left SI joint are unremarkable. IMPRESSION: Increased stranding in subcutaneous fat about the left buttock and hip consistent with worsened cellulitis.  The left gluteal musculature is incompletely imaged but there are a few tiny locules of gas within the gluteus maximus compatible with pyomyositis. No intramuscular abscess is visualized on this examination. Presacral abscess with a drainage catheter in place. Electronically Signed   By: Inge Rise M.D.   On: 03/07/2016 20:26   Ir Nephrostomy Exchange Left  Result Date: 03/08/2016 INDICATION: History rectal cancer, urinary bladder injury, persistent urine  leak. Nephrostomy tubes for urinary diversion. EXAM: IR EXCHANGE NEPHROSTOMY LEFT; IR EXCHANGE NEPHROSTOMY RIGHT COMPARISON:  01/27/2016 MEDICATIONS: Patient is already receiving IV antibiotics as an inpatient ANESTHESIA/SEDATION: Fentanyl 150 mcg IV Moderate Sedation Time:  None. The patient was continuously monitored during the procedure by the interventional radiology nurse under my direct supervision. CONTRAST:  10 cc - administered into the collecting system(s) FLUOROSCOPY TIME:  Fluoroscopy Time: 1 minutes 54 seconds (9 mGy). COMPLICATIONS: None immediate. PROCEDURE: Informed written consent was obtained from the patient after a thorough discussion of the procedural risks, benefits and alternatives. All questions were addressed. Maximal Sterile Barrier Technique was utilized including caps, mask, sterile gowns, sterile gloves, sterile drape, hand hygiene and skin antiseptic. A timeout was performed prior to the initiation of the procedure. Under sterile conditions and local anesthesia, the existing bilateral nephrostomy catheters were cut and removed over Amplatz guidewires. New 10 French nephrostomies were advanced into the collecting systems bilaterally. Retention loops formed in the renal pelvis. Contrast injection confirms position. Catheter secured with Prolene sutures and connected to external gravity drainage bags. Sterile dressing applied. No immediate complication. Patient tolerated the procedure well. IMPRESSION: Successful fluoroscopic bilateral nephrostomy exchanges (10 [place]) Electronically Signed   By: Jerilynn Mages.  Shick M.D.   On: 03/08/2016 15:36   Ir Nephrostomy Exchange Right  Result Date: 03/08/2016 INDICATION: History rectal cancer, urinary bladder injury, persistent urine leak. Nephrostomy tubes for urinary diversion. EXAM: IR EXCHANGE NEPHROSTOMY LEFT; IR EXCHANGE NEPHROSTOMY RIGHT COMPARISON:  01/27/2016 MEDICATIONS: Patient is already receiving IV antibiotics as an inpatient  ANESTHESIA/SEDATION: Fentanyl 150 mcg IV Moderate Sedation Time:  None. The patient was continuously monitored during the procedure by the interventional radiology nurse under my direct supervision. CONTRAST:  10 cc - administered into the collecting system(s) FLUOROSCOPY TIME:  Fluoroscopy Time: 1 minutes 54 seconds (9 mGy). COMPLICATIONS: None immediate. PROCEDURE: Informed written consent was obtained from the patient after a thorough discussion of the procedural risks, benefits and alternatives. All questions were addressed. Maximal Sterile Barrier Technique was utilized including caps, mask, sterile gowns, sterile gloves, sterile drape, hand hygiene and skin antiseptic. A timeout was performed prior to the initiation of the procedure. Under sterile conditions and local anesthesia, the existing bilateral nephrostomy catheters were cut and removed over Amplatz guidewires. New 10 French nephrostomies were advanced into the collecting systems bilaterally. Retention loops formed in the renal pelvis. Contrast injection confirms position. Catheter secured with Prolene sutures and connected to external gravity drainage bags. Sterile dressing applied. No immediate complication. Patient tolerated the procedure well. IMPRESSION: Successful fluoroscopic bilateral nephrostomy exchanges ([place]) Electronically Signed   By: Jerilynn Mages.  Shick M.D.   On: 03/08/2016 15:36    Labs:  CBC:  Recent Labs  03/07/16 0319 03/08/16 0224 03/09/16 0451 03/10/16 0500  WBC 8.6 7.0 6.2 5.8  HGB 10.0* 9.9* 10.1* 10.1*  HCT 30.9* 30.6* 32.4* 31.9*  PLT 320 363 409* 420*    COAGS:  Recent Labs  09/23/15 0900 11/12/15 0945 01/26/16 1540 03/04/16 2313  INR 1.15 1.12 1.00 1.33  APTT 38*  --   --  41*    BMP:  Recent Labs  03/07/16 0319 03/08/16 0224 03/09/16 0452 03/10/16 0500  NA 134* 137 136 140  K 3.3* 3.5 3.7 3.7  CL 100* 99* 101 104  CO2 '27 28 27 31  '$ GLUCOSE 108* 125* 97 96  BUN 5* 5* 7 6  CALCIUM 7.8*  8.0* 8.1* 7.9*  CREATININE 0.75 0.81 0.80 0.90  GFRNONAA >60 >60 >60 >60  GFRAA >60 >60 >60 >60    LIVER FUNCTION TESTS:  Recent Labs  02/09/16 0854 02/23/16 0915 03/04/16 1616 03/06/16 0121 03/09/16 0452  BILITOT 0.2* 0.3 1.3* 0.5  --   AST 18 18 40 22  --   ALT 13* 17 33 22  --   ALKPHOS 99 87 154* 111  --   PROT 7.4 5.8* 7.2 5.4*  --   ALBUMIN 3.5 2.9* 3.1* 1.9* 2.0*    Assessment and Plan: 1. Presacral abscess, s/p TG drain placement on 11/4 -significant erythema around this drain encompassing his entire buttock cheek and down his thigh. The drain itself appears to be draining urine, c/w thoughts of a fistula -would not recommend removal of this drain as he is having urine output in this drain.  If we remove this the pelvic fluid collection will recur.  For now the output is decreasing as hopefully the fistula is improving as the urine is diverted via PCNs, which are working well.   2. S/p B PCN drain exchanges 11/7 - Shick -in good position and draining well 3. S/p suprapubic drain exchange 11/6 -cont suprapubic drain in place for bladder decompression  Electronically Signed: Quadry Kampa E 03/10/2016, 10:10 AM   I spent a total of 15 Minutes at the the patient's bedside AND on the patient's hospital floor or unit, greater than 50% of which was counseling/coordinating care for pelvic fluid collection, PCNs

## 2016-03-10 NOTE — Progress Notes (Signed)
Patient ID: Andrew Kline, male   DOB: [birth year], 50 y.o.   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  Patient was seen at [facility] by general surgery who felt no surgical intervention was needed at that time and that patient will require percutaneous drainage via interventional radiology. Patient underwent CT-guided left transgluteal drain placement with fecal fluid aspirated and culture sent due to concern for fistula to bladder or bowel. General surgery and ID have seen the pt in consultation.  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 Patient has a very complicated GU system with bilateral nephrostomy tubes, suprapubic catheter, colostomy secondary to his metastatic cancer. Patient also noted to have cellulitis/pyomyositis of the left buttocks and upper thigh per CT scan. Antibiotic coverage has been extended to include vancomycin in addition to him being already on zosyn.  Palliative care consulted for goals of care and pain management.    Assessment & Plan:  Sepsis secondary to presacral abscess, status post transgluteal drain placement / Cellulitis and pyomyositis of gluteus maximus and left hip and upper thigh - Sepsis criteria met on the admission with leukocytosis, fever, tachycardia, elevated lactic acid of 3.4 and hypotension - CT abdomen and pelvis which was abnormal did show a increasing size of presacral collection now at 6.2 x 5.9 x 9.2 cm newly containing gas. - Source of sepsis is presacral abscess for which patient  underwent a transgluteal drain placement 03/05/2016. This was done by interventional radiology. He continues to have erythema around the drain in Compazine entire buttock and down the thigh and the drain itself appears to be draining urine which is consistent with fistula. Will follow-up with surgery if the recommendation is to repeat move the drain considering the cellulitis  - Continue vancomycin and Zosyn.  - The culture from the abscess was re-incubated for better growth  - Blood cultures negative to date  - Infectious disease team is following  - Continue pain management efforts  Hyponatremia - Likely secondary to hypovolemic hyponatremia - Sodium level normalized with hydration   History of rectal cancer/renal cell carcinoma status post bilateral nephrostomy tubes - Patient with history of metastatic rectal cancer status post colostomy, renal cell carcinoma of the right kidney status post partial nephrectomy with bilateral nephrostomy tubes - CT abdomen and pelvis with pulmonary metastases and possible liver metastases - Outpt follow up with oncology   Anemia of chronic disease - Secondary to history of malignancy - Hemoglobin stable   Depression - Continue Cymbalta and amitriptyline - Continue Lyrica - Stable mental status   Yeast UTI - Urine cultures with 70,000 colonies of yeast.    DVT prophylaxis: Lovenox Code Status: Full Family Communication: Updated patient. No family at bedside.  Disposition Plan: home once pain better controlled and once off of PCA   Consultants:   Interventional radiology Dr. Annamaria Boots 03/05/2016  Infectious diseases Dr. Baxter Flattery 03/05/2016  General surgery: Dr. Barry Dienes 03/06/2016  Wound care  Palliative care  Procedures:   CT abdomen and pelvis 03/04/2016  Chest x-ray 03/04/2016  CT-guided left transgluteal pelvic drain placement with 50 mL of  exudative, fecal fluid aspirated per Dr. Annamaria Boots 03/05/2016  CT left hip  03/07/2016  Antimicrobials:   IV Zosyn 03/04/2016 1 dose.  IV vancomycin 03/04/2016 1 dose  IV clindamycin 03/05/2016>>> 03/05/2016  IV Zosyn 03/06/2016  IV vancomycin 03/08/2016    Subjective: Pain better controlled this am.   Objective: Vitals:   03/09/16 2300 03/10/16 0400 03/10/16 0729 03/10/16 0800  BP:  126/81 109/75   Pulse:  62    Resp:  17  (!) 21  Temp: 97.7 F (36.5 C) 98.6 F (37 C) 98.4 F (36.9 C)   TempSrc: Axillary Oral Axillary   SpO2:  92%  91%  Weight:      Height:        Intake/Output Summary (Last 24 hours) at 03/10/16 0945 Last data filed at 03/10/16 0911  Gross per 24 hour  Intake             1834 ml  Output             5385 ml  Net            -3551 ml   Filed Weights   03/04/16 1544 03/04/16 2228  Weight: 81.6 kg (180 lb) 98.9 kg (218 lb)    Examination:  General exam: Appears calm and comfortable, no distress  Respiratory system: No wheezing, no rhonchi  Cardiovascular system: S1 & S2 heard, Rate controlled   Gastrointestinal system: (+) BS, non tender  Central nervous system: No focal neurological deficits. Extremities: LLE redness laterally, palpable pulses; pain in left leg better today  Skin: other than redness on LLE no other rah or ulcers  Psychiatry: Normal mood and behavior    Data Reviewed: I have personally reviewed following labs and imaging studies  CBC:  Recent Labs Lab 03/04/16 1616  03/06/16 0121 03/07/16 0319 03/08/16 0224 03/09/16 0451 03/10/16 0500  WBC 18.0*  < > 9.7 8.6 7.0 6.2 5.8  NEUTROABS 15.3*  --  8.4* 7.1 5.2 4.4  --   HGB 13.5  < > 10.4* 10.0* 9.9* 10.1* 10.1*  HCT 39.8  < > 31.8* 30.9* 30.6* 32.4* 31.9*  MCV 83.8  < > 84.8 84.0 85.2 86.4 86.4  PLT 288  < > 293 320 363 409* 420*  < > = values in this interval not displayed. Basic Metabolic Panel:  Recent Labs Lab 03/06/16 0121 03/07/16 0319 03/08/16 0224 03/09/16 0452 03/10/16 0500  NA 134* 134* 137 136 140  K 3.6 3.3* 3.5  3.7 3.7  CL 100* 100* 99* 101 104  CO2 '26 27 28 27 31  ' GLUCOSE 141* 108* 125* 97 96  BUN 10 5* 5* 7 6  CREATININE 0.86 0.75 0.81 0.80 0.90  CALCIUM 7.8* 7.8* 8.0* 8.1* 7.9*  MG 1.8  --   --   --   --   PHOS  --   --   --  3.7  --    GFR: Estimated Creatinine Clearance: 103.9 mL/min (by C-G formula based on SCr of 0.9 mg/dL). Liver Function Tests:  Recent Labs Lab 03/04/16 1616 03/06/16 0121 03/09/16 0452  AST 40 22  --   ALT 33 22  --   ALKPHOS 154* 111  --   BILITOT 1.3* 0.5  --   PROT 7.2 5.4*  --   ALBUMIN 3.1* 1.9* 2.0*   No results for input(s): LIPASE, AMYLASE in the last 168 hours. No results for input(s): AMMONIA in the last 168 hours. Coagulation Profile:  Recent Labs  Lab 03/04/16 2313  INR 1.33   Cardiac Enzymes: No results for input(s): CKTOTAL, CKMB, CKMBINDEX, TROPONINI in the last 168 hours. BNP (last 3 results) No results for input(s): PROBNP in the last 8760 hours. HbA1C: No results for input(s): HGBA1C in the last 72 hours. CBG:  Recent Labs Lab 03/09/16 1248  GLUCAP 166*   Lipid Profile: No results for input(s): CHOL, HDL, LDLCALC, TRIG, CHOLHDL, LDLDIRECT in the last 72 hours. Thyroid Function Tests: No results for input(s): TSH, T4TOTAL, FREET4, T3FREE, THYROIDAB in the last 72 hours. Anemia Panel: No results for input(s): VITAMINB12, FOLATE, FERRITIN, TIBC, IRON, RETICCTPCT in the last 72 hours. Urine analysis:    Component Value Date/Time   COLORURINE YELLOW 03/04/2016 1637   APPEARANCEUR CLEAR 03/04/2016 1637   LABSPEC 1.015 03/04/2016 1637   PHURINE 6.0 03/04/2016 1637   GLUCOSEU NEGATIVE 03/04/2016 1637   HGBUR LARGE (A) 03/04/2016 1637   BILIRUBINUR NEGATIVE 03/04/2016 1637   KETONESUR NEGATIVE 03/04/2016 1637   PROTEINUR 100 (A) 03/04/2016 1637   UROBILINOGEN 0.2 03/17/2015 0910   NITRITE NEGATIVE 03/04/2016 1637   LEUKOCYTESUR LARGE (A) 03/04/2016 1637   Sepsis  Labs: '@[email or web address]' (procalcitonin:4,lacticidven:4)   Culture, blood (Routine x 2)     Status: None   Collection Time: 03/04/16  4:16 PM  Result Value Ref Range Status   Specimen Description BLOOD RIGHT HAND  Final   Special Requests BOTTLES DRAWN AEROBIC AND ANAEROBIC 8CC EACH  Final   Culture NO GROWTH 5 DAYS  Final   Report Status 03/09/2016 FINAL  Final  Culture, blood (Routine x 2)     Status: None   Collection Time: 03/04/16  4:17 PM  Result Value Ref Range Status   Specimen Description LEFT ANTECUBITAL  Final   Special Requests BOTTLES DRAWN AEROBIC AND ANAEROBIC [facility] EACH  Final   Culture NO GROWTH 5 DAYS  Final   Report Status 03/09/2016 FINAL  Final  Urine culture     Status: Abnormal   Collection Time: 03/04/16  4:37 PM  Result Value Ref Range Status   Specimen Description URINE, CLEAN CATCH  Final   Special Requests NONE  Final   Culture 70,000 COLONIES/mL YEAST (A)  Final   Report Status 03/06/2016 FINAL  Final  MRSA PCR Screening     Status: None   Collection Time: 03/04/16 10:37 PM  Result Value Ref Range Status   MRSA by PCR NEGATIVE NEGATIVE Final  Culture, blood (x 2)     Status: None (Preliminary result)   Collection Time: 03/04/16 11:13 PM  Result Value Ref Range Status   Specimen Description BLOOD RIGHT ARM  Final   Special Requests BOTTLES DRAWN AEROBIC AND ANAEROBIC 10ML  Final   Culture NO GROWTH 3 DAYS  Final   Report Status PENDING  Incomplete  Culture, blood (x 2)     Status: None (Preliminary result)   Collection Time: 03/04/16 11:20 PM  Result Value Ref Range Status   Specimen Description BLOOD RIGHT HAND  Final   Special Requests AEROBIC BOTTLE ONLY 6ML  Final   Culture NO GROWTH 3 DAYS  Final   Report Status PENDING  Incomplete  Aerobic/Anaerobic Culture (surgical/deep wound)     Status: None (Preliminary result)   Collection Time: 03/05/16 12:57 PM  Result Value Ref Range Status   Specimen Description ABSCESS ABDOMEN  Final   Special Requests  Normal  Final   Gram Stain   Final    FEW WBC PRESENT, PREDOMINANTLY PMN ABUNDANT GRAM  NEGATIVE COCCOBACILLI MODERATE GRAM POSITIVE COCCI IN PAIRS    Culture   Final    CULTURE REINCUBATED FOR BETTER GROWTH HOLDING FOR POSSIBLE ANAEROBE    Report Status PENDING  Incomplete      Radiology Studies: Ct Hip Left W Contrast Result Date: 03/07/2016 Increased stranding in subcutaneous fat about the left buttock and hip consistent with worsened cellulitis. The left gluteal musculature is incompletely imaged but there are a few tiny locules of gas within the gluteus maximus compatible with pyomyositis. No intramuscular abscess is visualized on this examination. Presacral abscess with a drainage catheter in place. Electronically Signed   By: Inge Rise M.D.   On: 03/07/2016 20:26   Ir Nephrostomy Exchange Left Result Date: 03/08/2016 Successful fluoroscopic bilateral nephrostomy exchanges ([place]) Electronically Signed   By: Jerilynn Mages.  Shick M.D.   On: 03/08/2016 15:36   Ir Nephrostomy Exchange Right Result Date: 03/08/2016 Successful fluoroscopic bilateral nephrostomy exchanges ([place]) Electronically Signed   By: Jerilynn Mages.  Shick M.D.   On: 03/08/2016 15:36   Ct Image Guided Drainage By Percutaneous Cathete Result Date: 03/05/2016 Successful CT-guided left trans gluteal presacral abscess drain insertion. Dilute contrast within the abscess cavity from the recent CT, suspicious for fistula from either the bowel or bladder. Electronically Signed   By: Jerilynn Mages.  Shick M.D.   On: 03/05/2016 13:06     Scheduled Meds: . amitriptyline  50 mg Oral QHS  . DULoxetine  60 mg Oral Daily  . enoxaparin (LOVENOX) injection  40 mg Subcutaneous Q24H  . fentaNYL  150 mcg Transdermal Q72H  . HYDROmorphone   Intravenous Q4H  . piperacillin-tazobactam (ZOSYN)  IV  3.375 g Intravenous Q8H  . pregabalin  150 mg Oral BID  . senna-docusate  1 tablet Oral BID  . sodium chloride flush  3 mL Intravenous Q12H  . vancomycin   1,000 mg Intravenous Q8H   Continuous Infusions:   LOS: 6 days    Time spent: 15 minutes  Greater than 50% of the time spent on counseling and coordinating the care.   Leisa Lenz, MD [facility] Pager [phone]  If 7PM-7AM, please contact night-coverage [email or web address] Password [email or web address] 03/10/2016, 9:45 AM

## 2016-03-10 NOTE — Progress Notes (Signed)
[facility]   Reason for visit: Follow up on gluteal abscess, pyomyositis  Interval History: Dr. Brantley Stage evaluated; afebrile, now on PCA, continued pain Day 6 pip/tazo, vancomycin day 3   Physical Exam: Constitutional:  Vitals:   03/10/16 0729 03/10/16 0800  BP: 109/75   Pulse:    Resp:  (!) 21  Temp: 98.4 F (36.9 C)   sleeping Cardiovascular: RRR MS: left leg with increased edema  Review of Systems: Unable to assess, sleeping  Lab Results  Component Value Date   WBC 5.8 03/10/2016   HGB 10.1 (L) 03/10/2016   HCT 31.9 (L) 03/10/2016   MCV 86.4 03/10/2016   PLT 420 (H) 03/10/2016    Lab Results  Component Value Date   CREATININE 0.90 03/10/2016   BUN 6 03/10/2016   NA 140 03/10/2016   K 3.7 03/10/2016   CL 104 03/10/2016   CO2 31 03/10/2016    Lab Results  Component Value Date   ALT 22 03/06/2016   AST 22 03/06/2016   ALKPHOS 111 03/06/2016     Microbiology: Recent Results (from the past 240 hour(s))  Culture, blood (Routine x 2)     Status: None   Collection Time: 03/04/16  4:16 PM  Result Value Ref Range Status   Specimen Description BLOOD RIGHT HAND  Final   Special Requests BOTTLES DRAWN AEROBIC AND ANAEROBIC [place]  Final   Culture NO GROWTH 5 DAYS  Final   Report Status 03/09/2016 FINAL  Final  Culture, blood (Routine x 2)     Status: None   Collection Time: 03/04/16  4:17 PM  Result Value Ref Range Status   Specimen Description LEFT ANTECUBITAL  Final   Special Requests BOTTLES DRAWN AEROBIC AND ANAEROBIC [place]  Final   Culture NO GROWTH 5 DAYS  Final   Report Status 03/09/2016 FINAL  Final  Urine culture     Status: Abnormal   Collection Time: 03/04/16  4:37 PM  Result Value Ref Range Status   Specimen Description URINE, CLEAN CATCH  Final   Special Requests NONE  Final   Culture 70,000 COLONIES/mL YEAST (A)  Final   Report Status 03/06/2016 FINAL  Final  MRSA PCR Screening     Status: None   Collection Time: 03/04/16 10:37 PM  Result Value Ref Range Status   MRSA by PCR NEGATIVE NEGATIVE Final    Comment:        The GeneXpert MRSA Assay (FDA approved for NASAL specimens only), is one component of a comprehensive MRSA colonization surveillance program. It is not intended to diagnose MRSA infection nor to guide or monitor treatment for MRSA infections.   Culture, blood (x 2)     Status: None (Preliminary result)   Collection Time: 03/04/16 11:13 PM  Result Value Ref Range Status   Specimen Description BLOOD RIGHT ARM  Final   Special Requests BOTTLES DRAWN AEROBIC AND ANAEROBIC 10ML  Final   Culture NO GROWTH 4 DAYS  Final   Report Status PENDING  Incomplete  Culture, blood (x 2)     Status: None (Preliminary result)   Collection Time: 03/04/16 11:20 PM  Result Value Ref Range Status   Specimen Description BLOOD RIGHT HAND  Final   Special Requests AEROBIC BOTTLE ONLY 6ML  Final   Culture NO GROWTH 4 DAYS  Final   Report Status PENDING  Incomplete  Aerobic/Anaerobic Culture (surgical/deep wound)     Status: None   Collection  Time: 03/05/16 12:57 PM  Result Value Ref Range Status   Specimen Description ABSCESS ABDOMEN  Final   Special Requests Normal  Final   Gram Stain   Final    FEW WBC PRESENT, PREDOMINANTLY PMN ABUNDANT GRAM NEGATIVE COCCOBACILLI MODERATE GRAM POSITIVE COCCI IN PAIRS    Culture MULTIPLE ORGANISMS PRESENT, NONE PREDOMINANT  Final   Report Status 03/10/2016 FINAL  Final    Impression/Plan:  1. Presacral Abscess - draining.  On zosyn and vancomycin.   2.  Pyomyositis - on antibiotics.  I appreciate Dr. Brantley Stage evaluating patient yesterday, as mentioned, few good options and will need continued palliative care discussions 2. Pain - palliative care added PCA  Dr. Megan Salon to follow up tomorrow

## 2016-03-10 NOTE — Progress Notes (Signed)
Daily Progress Note   Patient Name: Andrew Kline       Date: 03/10/2016 DOB: 1965/05/30  Age: 50 y.o. MRN#: [id number] Attending Physician: Andrew Lis, MD Primary Care Physician: Andrew Hazard, MD Admit Date: 03/04/2016  Reason for Consultation/Follow-up: Establishing goals of care and Pain control  Subjective: I met again today with Andrew Kline, no family present. He was asleep but awoke to my voice. He is able to have an appropriate conversation with me although he does have moments of drifting off or distraction but comes right back to conversation after a moment. I will have to adjust PCA as he has been too lethargic.   We had a frank conversation today in which I expressed that I fear options may be limited. I also told him that I fear that he may not have much time left. I did present that he may want to consider how he wants to spend this time: with surgery, procedures, hospitalization, etc. vs focusing more on comfort and spending time with his family at home. He was very tearful but understood this conversation. He cried as he told me that he thought he would have more time. He has a lot to think about. No decisions were made today but emotional support provided.   Length of Stay: 6  Current Medications: Scheduled Meds:  . amitriptyline  50 mg Oral QHS  . DULoxetine  60 mg Oral Daily  . enoxaparin (LOVENOX) injection  40 mg Subcutaneous Q24H  . fentaNYL  150 mcg Transdermal Q72H  . HYDROmorphone   Intravenous Q4H  . piperacillin-tazobactam (ZOSYN)  IV  3.375 g Intravenous Q8H  . pregabalin  150 mg Oral BID  . senna-docusate  1 tablet Oral BID  . sodium chloride flush  3 mL Intravenous Q12H  . vancomycin  1,250 mg Intravenous Q12H    Continuous Infusions:   PRN  Meds: acetaminophen **OR** acetaminophen, albuterol, diphenhydrAMINE **OR** diphenhydrAMINE, ketorolac, naloxone **AND** sodium chloride flush, ondansetron **OR** ondansetron (ZOFRAN) IV, zolpidem  Physical Exam  Constitutional: He is oriented to person, place, and time. He appears well-developed and well-nourished.  HENT:  Head: Normocephalic and atraumatic.  Cardiovascular: Normal rate.   Pulmonary/Chest: Effort normal. No accessory muscle usage. No tachypnea. No respiratory distress.  Abdominal:  Colostomy and bilateral nephrostomy tubes.   Neurological: He  is alert and oriented to person, place, and time.  Occasionally makes confused statements.   Nursing note and vitals reviewed.           Vital Signs: BP 104/89 (BP Location: Right Arm)   Pulse 62   Temp 98.1 F (36.7 C) (Oral)   Resp 15   Ht _0  (1.651 m)   Wt 98.9 kg (218 lb) Comment: Weight taken by RN  SpO2 94%   BMI 36.28 kg/m  SpO2: SpO2: 94 % O2 Device: O2 Device: Nasal Cannula O2 Flow Rate: O2 Flow Rate (L/min): 0 L/min  Intake/output summary:   Intake/Output Summary (Last 24 hours) at 03/10/16 1453 Last data filed at 03/10/16 1139  Gross per 24 hour  Intake             1271 ml  Output             5335 ml  Net            -4064 ml   LBM: Last BM Date: 03/10/16 Baseline Weight: Weight: 81.6 kg (180 lb) Most recent weight: Weight: 98.9 kg (218 lb) (Weight taken by RN)       Palliative Assessment/Data:      Patient Active Problem List   Diagnosis Date Noted  . DNR (do not resuscitate) discussion   . Pyomyositis: Left gluteus maximus per CT 03/07/2016 03/08/2016  . Renal carcinoma ([place])   . Sepsis affecting skin ([place])   . Suprapubic catheter ([place])   . Uncontrolled pain   . Palliative care encounter   . Fever   . Abscess   . Sepsis ([place]) 03/04/2016  . Renal cell carcinoma ([place]) 03/04/2016  . Hyponatremia 03/04/2016  . Complicated UTI (urinary tract infection) 01/25/2016  . Pelvic fluid  collection 11/06/2015  . Physical deconditioning 07/07/2015  . Muscular deconditioning 07/07/2015  . Malnutrition of moderate degree ([place]) 02/13/2015  . Rectal carcinoma ([place]) 02/11/2015  . Rectal cancer metastasized to lung ([place]) 10/30/2014  . Rectal pain 09/26/2014  . Multiple lung nodules on CT 09/26/2014    Palliative Care Assessment & Plan   HPI: 50 y.o. 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 Whitney Kline), renal cell carcinoma (s/p right partial nephrectomy and bilateral nephrostomy drains), prior presacral abscess adm 9/25-10/1 admitted on 03/04/2016 with fever/chills r/t recurrent infection. Drain placed for gas forming organism in presacral abscess (antibiotics given via ID) along with treatment of cellulitis. 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 Uncontrolled pain and poor prognosis r/ metastatic cancer with recurrent infections prompted palliative consult.   Assessment: Lying in bed. He appears less tense than yesterday until he makes any movement.   Symptom Management:   Insomnia: Continue Ambien but only ordered 5 mg and he was ordered ambien CR 12.5 12/31/2015 per oncology notes. This may need to be increased but will work on pain management first.   Bowel Regimen: Continue Senokot-S BID.   Acute on chronic pain of multiple sources but mostly infected gluteal absess with drain and surrounding area. Unable to differentiate his pain at this time.  ? PCA Dilaudid: 0.2 mg/hr basal rate (decreased from 0.5 mg/hr). 0.3 mg every 10 min prn. Pain was better controlled but he was too lethargic. Will likely need further adjustment. May need to consider methadone if QTc allows but will require partnership from either a hospice or oncologist to manage this after discharge.  ?  Continue Fentanyl TD patch 150 mcg/hr. ? Continue home dose Cymbalta, Lyrica, Amitryptaline.    Goals of Care and  Additional Recommendations:  Limitations on Scope of Treatment: Full Scope Treatment  Code Status:  Full code  Prognosis:   I fear his extent of infection and complications may take further chemotherapy off the table. Overall prognosis poor.   Discharge Planning:  To Be Determined. Would be eligible for hospice but many barriers to this including antibiotics.     Thank you for allowing the Palliative Medicine Team to assist in the care of this patient.   Time In: 1400 Time Out: 1450 Total Time 61mn Prolonged Time Billed  no       Greater than 50%  of this time was spent counseling and coordinating care related to the above assessment and plan.  AVinie Sill NP Palliative Medicine Team Pager # [phone](M-F 8a-5p) Team Phone # [phone](Nights/Weekends)

## 2016-03-10 NOTE — Progress Notes (Signed)
Patient ID: Andrew Kline, male   DOB: Jan 23, 1966, 50 y.o.   MRN: [id number]  [facility] Progress Note     Subjective: Continues to have pain not controlled by PCA. Patient alert at times but complains of being tired and fell asleep multiple times while I was in room. No family at bedside.  Objective: Vital signs in last 24 hours: Temp:  [97.7 F (36.5 C)-98.6 F (37 C)] 98.4 F (36.9 C) (11/09 0729) Pulse Rate:  [62-76] 62 (11/09 0400) Resp:  [15-21] 21 (11/09 0800) BP: (94-136)/(43-82) 109/75 (11/09 0729) SpO2:  [90 %-98 %] 91 % (11/09 0800) FiO2 (%):  [0 %-28 %] 28 % (11/09 0400) Last BM Date: 03/10/16  Intake/Output from previous day: 11/08 0701 - 11/09 0700 In: 1594 [P.O.:840; I.V.:4; IV Piggyback:750] Out: 6720 [Urine:4725; Drains:110; Stool:200] Intake/Output this shift: Total I/O In: 240 [P.O.:240] Out: 800 [Urine:800]  PE: Gen:  Drowsy, NAD Pulm:  Effort normal Abd: Soft, NT/ND. B nephrostomy tubes and L buttock drain with clear/yellow fluid in drainage bags Ext:  Diffuse erythema and swelling of left buttock/thigh/calf, very tender  Lab Results:   Recent Labs  03/09/16 0451 03/10/16 0500  WBC 6.2 5.8  HGB 10.1* 10.1*  HCT 32.4* 31.9*  PLT 409* 420*   BMET  Recent Labs  03/09/16 0452 03/10/16 0500  NA 136 140  K 3.7 3.7  CL 101 104  CO2 27 31  GLUCOSE 97 96  BUN 7 6  CREATININE 0.80 0.90  CALCIUM 8.1* 7.9*   PT/INR No results for input(s): LABPROT, INR in the last 72 hours. CMP     Component Value Date/Time   NA 140 03/10/2016 0500   K 3.7 03/10/2016 0500   CL 104 03/10/2016 0500   CO2 31 03/10/2016 0500   GLUCOSE 96 03/10/2016 0500   BUN 6 03/10/2016 0500   CREATININE 0.90 03/10/2016 0500   CALCIUM 7.9 (L) 03/10/2016 0500   PROT 5.4 (L) 03/06/2016 0121   ALBUMIN 2.0 (L) 03/09/2016 0452   AST 22 03/06/2016 0121   ALT 22 03/06/2016 0121   ALKPHOS 111 03/06/2016 0121   BILITOT 0.5 03/06/2016 0121   GFRNONAA >60  03/10/2016 0500   GFRAA >60 03/10/2016 0500   Lipase     Component Value Date/Time   LIPASE 20 01/25/2016 1805       Studies/Results: Ir Nephrostomy Exchange Left  Result Date: 03/08/2016 INDICATION: History rectal cancer, urinary bladder injury, persistent urine leak. Nephrostomy tubes for urinary diversion. EXAM: IR EXCHANGE NEPHROSTOMY LEFT; IR EXCHANGE NEPHROSTOMY RIGHT COMPARISON:  01/27/2016 MEDICATIONS: Patient is already receiving IV antibiotics as an inpatient ANESTHESIA/SEDATION: Fentanyl 150 mcg IV Moderate Sedation Time:  None. The patient was continuously monitored during the procedure by the interventional radiology nurse under my direct supervision. CONTRAST:  10 cc - administered into the collecting system(s) FLUOROSCOPY TIME:  Fluoroscopy Time: 1 minutes 54 seconds (9 mGy). COMPLICATIONS: None immediate. PROCEDURE: Informed written consent was obtained from the patient after a thorough discussion of the procedural risks, benefits and alternatives. All questions were addressed. Maximal Sterile Barrier Technique was utilized including caps, mask, sterile gowns, sterile gloves, sterile drape, hand hygiene and skin antiseptic. A timeout was performed prior to the initiation of the procedure. Under sterile conditions and local anesthesia, the existing bilateral nephrostomy catheters were cut and removed over Amplatz guidewires. New 10 French nephrostomies were advanced into the collecting systems bilaterally. Retention loops formed in the renal pelvis. Contrast injection confirms position. Catheter secured with  Prolene sutures and connected to external gravity drainage bags. Sterile dressing applied. No immediate complication. Patient tolerated the procedure well. IMPRESSION: Successful fluoroscopic bilateral nephrostomy exchanges (10 [place]) Electronically Signed   By: Jerilynn Mages.  Shick M.D.   On: 03/08/2016 15:36   Ir Nephrostomy Exchange Right  Result Date: 03/08/2016 INDICATION: History  rectal cancer, urinary bladder injury, persistent urine leak. Nephrostomy tubes for urinary diversion. EXAM: IR EXCHANGE NEPHROSTOMY LEFT; IR EXCHANGE NEPHROSTOMY RIGHT COMPARISON:  01/27/2016 MEDICATIONS: Patient is already receiving IV antibiotics as an inpatient ANESTHESIA/SEDATION: Fentanyl 150 mcg IV Moderate Sedation Time:  None. The patient was continuously monitored during the procedure by the interventional radiology nurse under my direct supervision. CONTRAST:  10 cc - administered into the collecting system(s) FLUOROSCOPY TIME:  Fluoroscopy Time: 1 minutes 54 seconds (9 mGy). COMPLICATIONS: None immediate. PROCEDURE: Informed written consent was obtained from the patient after a thorough discussion of the procedural risks, benefits and alternatives. All questions were addressed. Maximal Sterile Barrier Technique was utilized including caps, mask, sterile gowns, sterile gloves, sterile drape, hand hygiene and skin antiseptic. A timeout was performed prior to the initiation of the procedure. Under sterile conditions and local anesthesia, the existing bilateral nephrostomy catheters were cut and removed over Amplatz guidewires. New 10 French nephrostomies were advanced into the collecting systems bilaterally. Retention loops formed in the renal pelvis. Contrast injection confirms position. Catheter secured with Prolene sutures and connected to external gravity drainage bags. Sterile dressing applied. No immediate complication. Patient tolerated the procedure well. IMPRESSION: Successful fluoroscopic bilateral nephrostomy exchanges ([place]) Electronically Signed   By: Jerilynn Mages.  Shick M.D.   On: 03/08/2016 15:36    Anti-infectives: Anti-infectives    Start     Dose/Rate Route Frequency Ordered Stop   03/08/16 2100  vancomycin (VANCOCIN) IVPB 1000 mg/200 mL premix     1,000 mg 200 mL/hr over 60 Minutes Intravenous Every 8 hours 03/08/16 1003     03/08/16 1015  vancomycin (VANCOCIN) 2,000 mg in sodium  chloride 0.9 % 500 mL IVPB     2,000 mg 250 mL/hr over 120 Minutes Intravenous  Once 03/08/16 1002 03/08/16 1443   03/06/16 0815  piperacillin-tazobactam (ZOSYN) IVPB 3.375 g     3.375 g 12.5 mL/hr over 240 Minutes Intravenous Every 8 hours 03/06/16 0802     03/05/16 2300  clindamycin (CLEOCIN) IVPB 600 mg  Status:  Discontinued     600 mg 100 mL/hr over 30 Minutes Intravenous Every 8 hours 03/04/16 2236 03/05/16 1309   03/04/16 1615  piperacillin-tazobactam (ZOSYN) IVPB 3.375 g     3.375 g 100 mL/hr over 30 Minutes Intravenous  Once 03/04/16 1603 03/04/16 1641   03/04/16 1615  vancomycin (VANCOCIN) IVPB 1000 mg/200 mL premix     1,000 mg 200 mL/hr over 60 Minutes Intravenous  Once 03/04/16 1603 03/04/16 1735       Assessment/Plan Cellulitis left hip - diffuse from groin to calf and with little to no improvement - May need debridement but concerned this wound would not heal  - Continue ABX  Presacral abscess, s/p TG drain placement on 11/4 - IR does not recommend removing drain due to concern that pelvic fluid collection would recur. 110cc/24hr. Output is slowly decreasing. History of rectal cancer/renal cell carcinoma s/p B PCN drain exchanges 11/7 - Shick - in good position and draining well - Palliative care consult, oncology to meet with patient as well  Plan - patient not alert enough today to hold significant conversation, no family at  bedside. Continue antibiotics. Buttock drain output decreasing; will discuss with IR if/when could pull.   LOS: 6 days    Jerrye Beavers , [facility] 03/10/2016, 11:05 AM Pager: [phone] Consults: [phone] Mon-Fri 7:00 am-4:30 pm Sat-Sun 7:00 am-11:30 am

## 2016-03-10 NOTE — Progress Notes (Signed)
Patient significant other Andrew Kline requested to speak to RN regarding patient prognosis. She was given update on progress so far today. Andrew Kline states that she and patient are not legally married and that the patient has two adult age children. This RN educated Andrew Kline that in order to be the patients HCPOA, proper paperwork needed to be completed and notarized. Advance Directive paperwork given to patient and Andrew Kline with instructions on how to complete. Both indicated understanding of instructions. Advised them that chaplain services can facilitate in the notary process when they are ready. Andrew Kline shows understanding that patients illness is terminal and states that she hopes to be able to take him home to make him comfortable as soon as possible and that she believes he wants the same thing. She had numerous questions regarding how that would work and was advised that case management and palliative care would be able to assist with that process if or once that decision had been made. Andrew Kline can be reached at [phone]. Emotional support was given to patient and Andrew Kline who was tearful.

## 2016-03-10 NOTE — Progress Notes (Signed)
Pharmacy Antibiotic Note Andrew Kline is a 50 y.o. male admitted on 03/04/2016 with presacral abscess, s/p TG drain placement on 11/4 in setting of metastatic rectal cancer. Patient is currently on Zosyn and vancomycin.  Vancomycin trough drawn today is 23 is slightly above desired range of 15 -20. SCr stable.   Plan: 1. Zosyn 3.375g IV q8h (4 hour infusion).  2. Adjust vancomycin to 1250 mg IV every 12 hours  3. SCr every 72 hours while on vancomycin    Height: '5\' 5"'$  (165.1 cm) Weight: 218 lb (98.9 kg) (Weight taken by RN) IBW/kg (Calculated) : 61.5  Temp (24hrs), Avg:98.2 F (36.8 C), Min:97.7 F (36.5 C), Max:98.6 F (37 C)   Recent Labs Lab 03/04/16 1614  03/04/16 1930 03/04/16 2313 03/05/16 0107  03/06/16 0121 03/07/16 0319 03/08/16 0224 03/09/16 0451 03/09/16 0452 03/10/16 0500 03/10/16 1151  WBC  --   < >  --   --   --   < > 9.7 8.6 7.0 6.2  --  5.8  --   CREATININE  --   < >  --   --   --   < > 0.86 0.75 0.81  --  0.80 0.90  --   LATICACIDVEN 3.40*  --  1.11 1.9 1.2  --   --   --   --   --   --   --   --   VANCOTROUGH  --   --   --   --   --   --   --   --   --   --   --   --  23*  < > = values in this interval not displayed.  Estimated Creatinine Clearance: 103.9 mL/min (by C-G formula based on SCr of 0.9 mg/dL).    Allergies  Allergen Reactions  . Oxaliplatin Itching    Antimicrobials this admission:   11/3 Zosyn >>  11/7 vancomycin >>   Dose adjustments this admission:  N/a  Microbiology results:  11/3 BCx: ngtd 11/3 UCx: yeast   11/3 abscess: ngF   Thank you for allowing pharmacy to be a part of this patient's care.  Vincenza Hews, PharmD, BCPS 03/10/2016, 1:37 PM Pager: [phone]

## 2016-03-11 DIAGNOSIS — K651 Peritoneal abscess: Secondary | ICD-10-CM

## 2016-03-11 DIAGNOSIS — B9689 Other specified bacterial agents as the cause of diseases classified elsewhere: Secondary | ICD-10-CM

## 2016-03-11 DIAGNOSIS — Z888 Allergy status to other drugs, medicaments and biological substances status: Secondary | ICD-10-CM

## 2016-03-11 DIAGNOSIS — L0889 Other specified local infections of the skin and subcutaneous tissue: Secondary | ICD-10-CM

## 2016-03-11 DIAGNOSIS — Z833 Family history of diabetes mellitus: Secondary | ICD-10-CM

## 2016-03-11 DIAGNOSIS — Z800 Family history of malignant neoplasm of digestive organs: Secondary | ICD-10-CM

## 2016-03-11 DIAGNOSIS — F17210 Nicotine dependence, cigarettes, uncomplicated: Secondary | ICD-10-CM

## 2016-03-11 DIAGNOSIS — Z801 Family history of malignant neoplasm of trachea, bronchus and lung: Secondary | ICD-10-CM

## 2016-03-11 LAB — CBC
HCT: 34 % — ABNORMAL LOW (ref 39.0–52.0)
Hemoglobin: 10.6 g/dL — ABNORMAL LOW (ref 13.0–17.0)
MCH: 27.7 pg (ref 26.0–34.0)
MCHC: 31.2 g/dL (ref 30.0–36.0)
MCV: 88.8 fL (ref 78.0–100.0)
PLATELETS: 447 10*3/uL — AB (ref 150–400)
RBC: 3.83 MIL/uL — AB (ref 4.22–5.81)
RDW: 20.4 % — ABNORMAL HIGH (ref 11.5–15.5)
WBC: 6.1 10*3/uL (ref 4.0–10.5)

## 2016-03-11 LAB — CREATININE, SERUM: CREATININE: 1.08 mg/dL (ref 0.61–1.24)

## 2016-03-11 MED ORDER — FENTANYL 25 MCG/HR TD PT72
175.0000 ug | MEDICATED_PATCH | TRANSDERMAL | Status: DC
  Administered 2016-03-12: 175 ug via TRANSDERMAL
  Filled 2016-03-11: qty 3

## 2016-03-11 MED ORDER — HYDROMORPHONE 1 MG/ML IV SOLN
INTRAVENOUS | Status: DC
  Administered 2016-03-11: 0 mg via INTRAVENOUS
  Administered 2016-03-11: 1 mg via INTRAVENOUS
  Administered 2016-03-11: 0.89 mg via INTRAVENOUS
  Administered 2016-03-11: 1.74 mg via INTRAVENOUS
  Administered 2016-03-12: 0.5 mg via INTRAVENOUS
  Administered 2016-03-12: 2 mg via INTRAVENOUS
  Administered 2016-03-12: 0.5 mg via INTRAVENOUS
  Administered 2016-03-12 (×2): 1.5 mg via INTRAVENOUS
  Administered 2016-03-13: 0 mg via INTRAVENOUS
  Administered 2016-03-13: 1 mg via INTRAVENOUS
  Administered 2016-03-13: 0.5 mg via INTRAVENOUS
  Filled 2016-03-11: qty 25

## 2016-03-11 NOTE — Progress Notes (Signed)
Daily Progress Note   Patient Name: Andrew Kline       Date: 03/11/2016 DOB: [birth year]  Age: 50 y.o. MRN#: [id number] Attending Physician: Robbie Lis, MD Primary Care Physician: [name], MD Admit Date: 03/04/2016  Reason for Consultation/Follow-up: Establishing goals of care, Pain control and Psychosocial/spiritual support  Subjective: Pt a/o x3. Reports feeling more alert. Pain is not worse with decrease in basal dose. Discussed options. Pain primarily worse with any movement. I see him trying not to use bolus option with PCA. Surgical PA-C in and reports erythema to right thigh is decreased.  Length of Stay: 7  Current Medications: Scheduled Meds:  . amitriptyline  50 mg Oral QHS  . DULoxetine  60 mg Oral Daily  . enoxaparin (LOVENOX) injection  40 mg Subcutaneous Q24H  . [START ON 03/12/2016] fentaNYL  175 mcg Transdermal Q72H  . HYDROmorphone   Intravenous Q4H  . piperacillin-tazobactam (ZOSYN)  IV  3.375 g Intravenous Q8H  . pregabalin  150 mg Oral BID  . senna-docusate  1 tablet Oral BID  . sodium chloride flush  3 mL Intravenous Q12H  . vancomycin  1,250 mg Intravenous Q12H    Continuous Infusions:   PRN Meds: acetaminophen **OR** acetaminophen, albuterol, diphenhydrAMINE **OR** diphenhydrAMINE, ketorolac, naloxone **AND** sodium chloride flush, ondansetron **OR** ondansetron (ZOFRAN) IV, zolpidem  Physical Exam  Constitutional: He is oriented to person, place, and time. He appears well-developed and well-nourished.  Neck: Normal range of motion.  Cardiovascular: Normal rate and regular rhythm.   Pulmonary/Chest: Effort normal.  Abdominal: He exhibits distension.  Genitourinary:  Genitourinary Comments: bilat nephrostomy tubes; suprapubic cath    Neurological: He is alert and oriented to person, place, and time.  Skin: Skin is warm and dry. There is erythema.  Psychiatric: His behavior is normal. Judgment and thought content normal.  Nursing note and vitals reviewed.           Vital Signs: BP 116/73 (BP Location: Right Arm)   Pulse 76   Temp 97.6 F (36.4 C) (Axillary)   Resp (!) 21   Ht '5\' 5"'$  (1.651 m)   Wt 98.9 kg (218 lb) Comment: Weight taken by RN  SpO2 91%   BMI 36.28 kg/m  SpO2: SpO2: 91 % O2 Device: O2 Device: Not Delivered O2 Flow Rate: O2 Flow Rate (  L/min): 0 L/min  Intake/output summary:  Intake/Output Summary (Last 24 hours) at 03/11/16 1111 Last data filed at 03/11/16 0901  Gross per 24 hour  Intake             1130 ml  Output             4450 ml  Net            -3320 ml   LBM: Last BM Date: 03/11/16 Baseline Weight: Weight: 81.6 kg (180 lb) Most recent weight: Weight: 98.9 kg (218 lb) (Weight taken by RN)       Palliative Assessment/Data:      Patient Active Problem List   Diagnosis Date Noted  . Goals of care, counseling/discussion   . DNR (do not resuscitate) discussion   . Pyomyositis: Left gluteus maximus per CT 03/07/2016 03/08/2016  . Sepsis affecting skin ([place])   . Suprapubic catheter ([place])   . Uncontrolled pain   . Palliative care encounter   . Pelvic abscess in male [facility])   . Sepsis ([place]) 03/04/2016  . Renal cell carcinoma ([place]) 03/04/2016  . Hyponatremia 03/04/2016  . Complicated UTI (urinary tract infection) 01/25/2016  . Physical deconditioning 07/07/2015  . Muscular deconditioning 07/07/2015  . Malnutrition of moderate degree ([place]) 02/13/2015  . 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 [name]), renal cell carcinoma (s/p right partial nephrectomy and bilateral  nephrostomy drains), prior presacral abscess adm 9/25-10/1admitted on 11/3/2017with fever/chills r/t recurrent infection. Drain placed for gas forming organism in presacral abscess (antibiotics given via ID) along with treatment of cellulitis. 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 Uncontrolled pain and poor prognosis r/ metastatic cancer with recurrent infections prompted palliative consult.   Recommendations/Plan:  Will dc basal rate. Minimal PRN's noted where pt had lenghty periods without bolus. Pt markedly worse with movement and he is struggling whether to push the button; trying to tough it out so to speak. Will increase bolus to 0.'5mg'$  dilaudid q15 min prn and increase fentanyl TD 175 mcg q 72hrs  GOC were not addressed during this meeting. Focus was pain mgt. Will address again over the weekend to assess issues relevantt to pt and family regarding level of care/interventions going forward. Out office has left a message with Dr. [name] (pt's oncologist) and awaiting call back  Goals of Care and Additional Recommendations:  Limitations on Scope of Treatment: Full Scope Treatment  Code Status:    Code Status Orders        Start     Ordered   03/04/16 2237  Full code  Continuous     03/04/16 2236    Code Status History    Date Active Date Inactive Code Status Order ID Comments User Context   01/25/2016 11:56 PM 01/26/2016 12:39 PM Full Code [id number]  Oswald Hillock, MD Inpatient   08/27/2015  9:15 AM 08/31/2015  8:10 PM DNR [id number]  Aviva Signs, MD Inpatient   02/11/2015  1:22 PM 02/14/2015  4:09 PM Full Code [id number]  Aviva Signs, MD Inpatient   10/29/2014  2:36 PM 10/30/2014  3:29 AM Full Code [id number]  Arne Cleveland, MD [facility]    Advance Directive Documentation   Flowsheet Row Most Recent Value  Type of Advance Directive  Healthcare Power of [place], Living will  Pre-existing out of facility DNR order (yellow form or pink MOST form)  No data  "MOST" Form in Place?  No  data       Prognosis:   < 6 months I the setting of metastatic rectal cancer and renal cell ca; worsening pain. I did not share this with pt.   Discharge Planning:  To Be Determined  Care plan was discussed with Dr. Charlies Silvers  Thank you for allowing the Palliative Medicine Team to assist in the care of this patient.   Time In: 1000 Time Out: 1045 Total Time 45 min Prolonged Time Billed  no       Greater than 50%  of this time was spent counseling and coordinating care related to the above assessment and plan.  Dory Horn, NP  Please contact Palliative Medicine Team phone at [phone] for questions and concerns.

## 2016-03-11 NOTE — Progress Notes (Signed)
Patient ID: Andrew Kline, male   DOB: [birth year], 50 y.o.   MRN: [id number]  PROGRESS NOTE    Andrew Kline  ESL:[id number] DOB: March 31, 1966 DOA: 03/04/2016  PCP: [name], MD   Brief Narrative:  50 year old male with past medical history of metastatic rectal cancer status post colostomy, renal cell carcinoma of the right kidney, status post partial nephrectomy with bilateral nephrostomy tubes and prior presacral abscesses requiring admission 9/25-10/1 who presented with recurrent infection.  Patient was seen at [facility] by general surgery who felt no surgical intervention was needed at that time and that patient will require percutaneous drainage via interventional radiology. Patient underwent CT-guided left transgluteal drain placement with fecal fluid aspirated and culture sent due to concern for fistula to bladder or bowel. General surgery and ID have seen the pt in consultation.  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 Patient has a very complicated GU system with bilateral nephrostomy tubes, suprapubic catheter, colostomy secondary to his metastatic cancer. Patient also noted to have cellulitis/pyomyositis of the left buttocks and upper thigh per CT scan. Antibiotic coverage has been extended to include vancomycin in addition to him being already on zosyn.  Palliative care consulted for goals of care and pain management.    Assessment & Plan:  Sepsis secondary to presacral abscess, status post transgluteal drain placement / Cellulitis and pyomyositis of gluteus maximus and left hip and upper thigh - Sepsis criteria met on the admission with leukocytosis, fever, tachycardia, elevated lactic acid of 3.4 and hypotension Source of sepsis is presacral abscess - CT abdomen and pelvis which was abnormal did show a increasing size of presacral collection now at 6.2 x 5.9 x 9.2 cm newly containing gas. - S/P transgluteal  drain placement 03/05/2016. This was done by interventional radiology. He continues to have erythema around the drain in Compazine entire buttock and down the thigh and the drain itself appears to be draining urine which is consistent with fistula. Will follow-up with surgery if the recommendation is to repeat move the drain considering the cellulitis  - Continue vancomycin and Zosyn.  - The culture from the abscess showed multiple organisms  - Blood cultures negative to date  - Infectious disease team is following  - Continue pain management efforts - Palliative care team is following as well, we appreciate their input   Hyponatremia - Likely secondary to hypovolemic hyponatremia - Sodium level normalized with hydration   History of rectal cancer/renal cell carcinoma status post bilateral nephrostomy tubes - Patient with history of metastatic rectal cancer status post colostomy, renal cell carcinoma of the right kidney status post partial nephrectomy with bilateral nephrostomy tubes - CT abdomen and pelvis with pulmonary metastases and possible liver metastases - Outpt follow up with oncology   Anemia of chronic disease - Secondary to history of malignancy - Hemoglobin stable at 10.6  Depression - Continue Cymbalta and amitriptyline - Continue Lyrica - Stable, does not feel depressed   Yeast UTI - Urine cultures with 70,000 colonies of yeast.    DVT prophylaxis: Lovenox subQ Code Status: Full Family Communication: Updated patient. No family at bedside.  Disposition Plan: home once pt pain improves ; he needs to be off of PCA in order for discharge home    Consultants:   Interventional radiology Dr. Annamaria Boots 03/05/2016  Infectious diseases Dr. Baxter Flattery 03/05/2016  General surgery: Dr. Barry Dienes 03/06/2016  Wound care  Palliative care  PT  Procedures:  CT abdomen and pelvis 03/04/2016  Chest x-ray 03/04/2016  CT-guided left transgluteal pelvic drain placement  with 50 mL of exudative, fecal fluid aspirated per Dr. Annamaria Boots 03/05/2016  CT left hip 03/07/2016  Antimicrobials:   IV Zosyn 03/04/2016 1 dose.  IV vancomycin 03/04/2016 1 dose  IV clindamycin 03/05/2016>>> 03/05/2016  IV Zosyn 03/06/2016 -->   IV vancomycin 03/08/2016 -->    Subjective: Pain is 5-6/10 this am.   Objective: Vitals:   03/11/16 0018 03/11/16 0411 03/11/16 0734 03/11/16 0858  BP:  118/72 116/73   Pulse:  76    Resp: (!) 8 12  (!) 21  Temp:  97.9 F (36.6 C) 97.6 F (36.4 C)   TempSrc:  Oral Axillary   SpO2: 92% 90%  91%  Weight:      Height:        Intake/Output Summary (Last 24 hours) at 03/11/16 0933 Last data filed at 03/11/16 0901  Gross per 24 hour  Intake              890 ml  Output             4450 ml  Net            -3560 ml   Filed Weights   03/04/16 1544 03/04/16 2228  Weight: 81.6 kg (180 lb) 98.9 kg (218 lb)    Examination:  General exam: Appears calm and comfortable, no acute distress  Respiratory system: Bilateral air entry, no wheezing  Cardiovascular system: S1 & S2 heard, RRR Gastrointestinal system: (+) BS, non tender, non distended; perc nephrostomies bilaterally, perc drainage and suprapubic catheter  Central nervous system: Non focal  Extremities: LLE redness laterally just about the same as yesterday without significant improvement  Skin: other than redness on LLE no ulcers, no rash  Psychiatry: Normal mood and behavior, no agitation, no restlessness   Data Reviewed: I have personally reviewed following labs and imaging studies  CBC:  Recent Labs Lab 03/04/16 1616  03/06/16 0121 03/07/16 0319 03/08/16 0224 03/09/16 0451 03/10/16 0500 03/11/16 0430  WBC 18.0*  < > 9.7 8.6 7.0 6.2 5.8 6.1  NEUTROABS 15.3*  --  8.4* 7.1 5.2 4.4  --   --   HGB 13.5  < > 10.4* 10.0* 9.9* 10.1* 10.1* 10.6*  HCT 39.8  < > 31.8* 30.9* 30.6* 32.4* 31.9* 34.0*  MCV 83.8  < > 84.8 84.0 85.2 86.4 86.4 88.8  PLT 288  < > 293 320  363 409* 420* 447*  < > = values in this interval not displayed. Basic Metabolic Panel:  Recent Labs Lab 03/06/16 0121 03/07/16 0319 03/08/16 0224 03/09/16 0452 03/10/16 0500 03/11/16 0430  NA 134* 134* 137 136 140  --   K 3.6 3.3* 3.5 3.7 3.7  --   CL 100* 100* 99* 101 104  --   CO2 _0 --   GLUCOSE 141* 108* 125* 97 96  --   BUN 10 5* 5* 7 6  --   CREATININE 0.86 0.75 0.81 0.80 0.90 1.08  CALCIUM 7.8* 7.8* 8.0* 8.1* 7.9*  --   MG 1.8  --   --   --   --   --   PHOS  --   --   --  3.7  --   --    GFR: Estimated Creatinine Clearance: 86.6 mL/min (by C-G formula based on SCr of 1.08 mg/dL). Liver Function Tests:  Recent Labs Lab 03/04/16  1616 03/06/16 0121 03/09/16 0452  AST 40 22  --   ALT 33 22  --   ALKPHOS 154* 111  --   BILITOT 1.3* 0.5  --   PROT 7.2 5.4*  --   ALBUMIN 3.1* 1.9* 2.0*   No results for input(s): LIPASE, AMYLASE in the last 168 hours. No results for input(s): AMMONIA in the last 168 hours. Coagulation Profile:  Recent Labs Lab 03/04/16 2313  INR 1.33   Cardiac Enzymes: No results for input(s): CKTOTAL, CKMB, CKMBINDEX, TROPONINI in the last 168 hours. BNP (last 3 results) No results for input(s): PROBNP in the last 8760 hours. HbA1C: No results for input(s): HGBA1C in the last 72 hours. CBG:  Recent Labs Lab 03/09/16 1248  GLUCAP 166*   Lipid Profile: No results for input(s): CHOL, HDL, LDLCALC, TRIG, CHOLHDL, LDLDIRECT in the last 72 hours. Thyroid Function Tests: No results for input(s): TSH, T4TOTAL, FREET4, T3FREE, THYROIDAB in the last 72 hours. Anemia Panel: No results for input(s): VITAMINB12, FOLATE, FERRITIN, TIBC, IRON, RETICCTPCT in the last 72 hours. Urine analysis:    Component Value Date/Time   COLORURINE YELLOW 03/04/2016 1637   APPEARANCEUR CLEAR 03/04/2016 1637   LABSPEC 1.015 03/04/2016 1637   PHURINE 6.0 03/04/2016 1637   GLUCOSEU NEGATIVE 03/04/2016 1637   HGBUR LARGE (A) 03/04/2016 1637    BILIRUBINUR NEGATIVE 03/04/2016 1637   KETONESUR NEGATIVE 03/04/2016 1637   PROTEINUR 100 (A) 03/04/2016 1637   UROBILINOGEN 0.2 03/17/2015 0910   NITRITE NEGATIVE 03/04/2016 1637   LEUKOCYTESUR LARGE (A) 03/04/2016 1637   Sepsis Labs: _0 (procalcitonin:4,lacticidven:4)   Results for orders placed or performed during the hospital encounter of 03/04/16  Culture, blood (Routine x 2)     Status: None   Collection Time: 03/04/16  4:16 PM  Result Value Ref Range Status   Specimen Description BLOOD RIGHT HAND  Final   Special Requests BOTTLES DRAWN AEROBIC AND ANAEROBIC [place]  Final   Culture NO GROWTH 5 DAYS  Final   Report Status 03/09/2016 FINAL  Final  Culture, blood (Routine x 2)     Status: None   Collection Time: 03/04/16  4:17 PM  Result Value Ref Range Status   Specimen Description LEFT ANTECUBITAL  Final   Special Requests BOTTLES DRAWN AEROBIC AND ANAEROBIC [place]  Final   Culture NO GROWTH 5 DAYS  Final   Report Status 03/09/2016 FINAL  Final  Urine culture     Status: Abnormal   Collection Time: 03/04/16  4:37 PM  Result Value Ref Range Status   Specimen Description URINE, CLEAN CATCH  Final   Special Requests NONE  Final   Culture 70,000 COLONIES/mL YEAST (A)  Final   Report Status 03/06/2016 FINAL  Final  MRSA PCR Screening     Status: None   Collection Time: 03/04/16 10:37 PM  Result Value Ref Range Status   MRSA by PCR NEGATIVE NEGATIVE Final    Comment:        The GeneXpert MRSA Assay (FDA approved for NASAL specimens only), is one component of a comprehensive MRSA colonization surveillance program. It is not intended to diagnose MRSA infection nor to guide or monitor treatment for MRSA infections.   Culture, blood (x 2)     Status: None   Collection Time: 03/04/16 11:13 PM  Result Value Ref Range Status   Specimen Description BLOOD RIGHT ARM  Final   Special Requests BOTTLES DRAWN AEROBIC AND ANAEROBIC 10ML  Final   Culture  NO GROWTH 5  DAYS  Final   Report Status 03/10/2016 FINAL  Final  Culture, blood (x 2)     Status: None   Collection Time: 03/04/16 11:20 PM  Result Value Ref Range Status   Specimen Description BLOOD RIGHT HAND  Final   Special Requests AEROBIC BOTTLE ONLY 6ML  Final   Culture NO GROWTH 5 DAYS  Final   Report Status 03/10/2016 FINAL  Final  Aerobic/Anaerobic Culture (surgical/deep wound)     Status: None   Collection Time: 03/05/16 12:57 PM  Result Value Ref Range Status   Specimen Description ABSCESS ABDOMEN  Final   Special Requests Normal  Final   Gram Stain   Final    FEW WBC PRESENT, PREDOMINANTLY PMN ABUNDANT GRAM NEGATIVE COCCOBACILLI MODERATE GRAM POSITIVE COCCI IN PAIRS    Culture MULTIPLE ORGANISMS PRESENT, NONE PREDOMINANT  Final   Report Status 03/10/2016 FINAL  Final     Radiology Studies: Ct Hip Left W Contrast Result Date: 03/07/2016 Increased stranding in subcutaneous fat about the left buttock and hip consistent with worsened cellulitis. The left gluteal musculature is incompletely imaged but there are a few tiny locules of gas within the gluteus maximus compatible with pyomyositis. No intramuscular abscess is visualized on this examination. Presacral abscess with a drainage catheter in place. Electronically Signed   By: Inge Rise M.D.   On: 03/07/2016 20:26   Ir Nephrostomy Exchange Left Result Date: 03/08/2016 Successful fluoroscopic bilateral nephrostomy exchanges (10 [place]) Electronically Signed   By: Jerilynn Mages.  Shick M.D.   On: 03/08/2016 15:36   Ir Nephrostomy Exchange Right Result Date: 03/08/2016 Successful fluoroscopic bilateral nephrostomy exchanges (10 [place]) Electronically Signed   By: Jerilynn Mages.  Shick M.D.   On: 03/08/2016 15:36   Ct Image Guided Drainage By Percutaneous Cathete Result Date: 03/05/2016 Successful CT-guided left trans gluteal presacral abscess drain insertion. Dilute contrast within the abscess cavity from the recent CT, suspicious for fistula from  either the bowel or bladder. Electronically Signed   By: Jerilynn Mages.  Shick M.D.   On: 03/05/2016 13:06     Scheduled Meds: . amitriptyline  50 mg Oral QHS  . DULoxetine  60 mg Oral Daily  . enoxaparin (LOVENOX) injection  40 mg Subcutaneous Q24H  . fentaNYL  150 mcg Transdermal Q72H  . HYDROmorphone   Intravenous Q4H  . piperacillin-tazobactam (ZOSYN)  IV  3.375 g Intravenous Q8H  . pregabalin  150 mg Oral BID  . senna-docusate  1 tablet Oral BID  . sodium chloride flush  3 mL Intravenous Q12H  . vancomycin  1,250 mg Intravenous Q12H   Continuous Infusions:   LOS: 7 days    Time spent: 25 minutes  Greater than 50% of the time spent on counseling and coordinating the care.   Leisa Lenz, MD [facility] Pager [phone]  If 7PM-7AM, please contact night-coverage [email or web address] Password [facility] 03/11/2016, 9:33 AM

## 2016-03-11 NOTE — Progress Notes (Signed)
Patient ID: Logyn Kendrick, male   DOB: 1966-04-19, 50 y.o.   MRN: [id number]  [facility] Surgery Progress Note     Subjective: More alert today. Still in a lot of pain but better controlled than yesterday. Buttock drain pulled yesterday. Patient reports less throbbing in this area. Denies any increased pain.  Objective: Vital signs in last 24 hours: Temp:  [97.5 F (36.4 C)-98.2 F (36.8 C)] 97.6 F (36.4 C) (11/10 0734) Pulse Rate:  [25-76] 76 (11/10 0411) Resp:  [8-21] 21 (11/10 0858) BP: (104-130)/(65-89) 116/73 (11/10 0734) SpO2:  [84 %-97 %] 91 % (11/10 0858) FiO2 (%):  [28 %] 28 % (11/10 0858) Last BM Date: 03/11/16  Intake/Output from previous day: 11/09 0701 - 11/10 0700 In: 1130 [P.O.:480; IV Piggyback:650] Out: 9518 [Urine:3975; Stool:350] Intake/Output this shift: Total I/O In: 240 [P.O.:240] Out: 925 [Urine:925]  PE: Gen:  Alert, NAD Pulm:  Effort normal Abd: Soft, NT/ND. B nephrostomy tubes and L buttock drain with clear/yellow fluid in drainage bags Ext:  Diffuse erythema and swelling of left buttock/thigh/calf does appear less erythematous today in calf/distal thigh, very tender and painful with moving in bed, previous drainage site clean  Lab Results:   Recent Labs  03/10/16 0500 03/11/16 0430  WBC 5.8 6.1  HGB 10.1* 10.6*  HCT 31.9* 34.0*  PLT 420* 447*   BMET  Recent Labs  03/09/16 0452 03/10/16 0500 03/11/16 0430  NA 136 140  --   K 3.7 3.7  --   CL 101 104  --   CO2 27 31  --   GLUCOSE 97 96  --   BUN 7 6  --   CREATININE 0.80 0.90 1.08  CALCIUM 8.1* 7.9*  --    PT/INR No results for input(s): LABPROT, INR in the last 72 hours. CMP     Component Value Date/Time   NA 140 03/10/2016 0500   K 3.7 03/10/2016 0500   CL 104 03/10/2016 0500   CO2 31 03/10/2016 0500   GLUCOSE 96 03/10/2016 0500   BUN 6 03/10/2016 0500   CREATININE 1.08 03/11/2016 0430   CALCIUM 7.9 (L) 03/10/2016 0500   PROT 5.4 (L) 03/06/2016 0121   ALBUMIN 2.0 (L) 03/09/2016 0452   AST 22 03/06/2016 0121   ALT 22 03/06/2016 0121   ALKPHOS 111 03/06/2016 0121   BILITOT 0.5 03/06/2016 0121   GFRNONAA >60 03/11/2016 0430   GFRAA >60 03/11/2016 0430   Lipase     Component Value Date/Time   LIPASE 20 01/25/2016 1805       Studies/Results: No results found.  Anti-infectives: Anti-infectives    Start     Dose/Rate Route Frequency Ordered Stop   03/10/16 1700  vancomycin (VANCOCIN) 1,500 mg in sodium chloride 0.9 % 500 mL IVPB  Status:  Discontinued     1,500 mg 250 mL/hr over 120 Minutes Intravenous Every 12 hours 03/10/16 1341 03/10/16 1341   03/10/16 1700  vancomycin (VANCOCIN) 1,250 mg in sodium chloride 0.9 % 250 mL IVPB     1,250 mg 166.7 mL/hr over 90 Minutes Intravenous Every 12 hours 03/10/16 1341     03/08/16 2100  vancomycin (VANCOCIN) IVPB 1000 mg/200 mL premix  Status:  Discontinued     1,000 mg 200 mL/hr over 60 Minutes Intravenous Every 8 hours 03/08/16 1003 03/10/16 1341   03/08/16 1015  vancomycin (VANCOCIN) 2,000 mg in sodium chloride 0.9 % 500 mL IVPB     2,000 mg 250 mL/hr over 120 Minutes  Intravenous  Once 03/08/16 1002 03/08/16 1443   03/06/16 0815  piperacillin-tazobactam (ZOSYN) IVPB 3.375 g     3.375 g 12.5 mL/hr over 240 Minutes Intravenous Every 8 hours 03/06/16 0802     03/05/16 2300  clindamycin (CLEOCIN) IVPB 600 mg  Status:  Discontinued     600 mg 100 mL/hr over 30 Minutes Intravenous Every 8 hours 03/04/16 2236 03/05/16 1309   03/04/16 1615  piperacillin-tazobactam (ZOSYN) IVPB 3.375 g     3.375 g 100 mL/hr over 30 Minutes Intravenous  Once 03/04/16 1603 03/04/16 1641   03/04/16 1615  vancomycin (VANCOCIN) IVPB 1000 mg/200 mL premix     1,000 mg 200 mL/hr over 60 Minutes Intravenous  Once 03/04/16 1603 03/04/16 1735       Assessment/Plan Cellulitis left hip - diffuse from groin to calf with small amount improvement from yesterday Presacral abscess, s/p TG drain placement on 11/4 -  drain pulled yesterday History of rectal cancer/renal cell carcinoma s/p B PCN drain exchanges 11/7 - Shick - in good position and draining well - appreciate palliative and oncology recs  Plan - more alert today, still in severe pain although this is better controlled than yesterday. Cellulitis with little improvement. Continue antibiotics. Appreciate palliative care recommendations for goals of care and pain management.    LOS: 7 days    Jerrye Beavers , [facility] Surgery 03/11/2016, 10:44 AM Pager: [phone] Consults: [phone] Mon-Fri 7:00 am-4:30 pm Sat-Sun 7:00 am-11:30 am

## 2016-03-11 NOTE — Progress Notes (Signed)
Occupational Therapy Treatment Patient Details Name: Andrew Kline MRN: [id number] DOB: [birth year] Today's Date: 03/11/2016    History of present illness Pt with hx of rectal cancer (s/p colostomy) with mets to lung, renal cell carcinoma-R kidney s/p partial nephrectomy, B nephrostomy drains, suprapubic catheter, presacral abscess, cellulits L LE. Plan now is for home with hospice care.   OT comments  Pt stating his goal is to go home. Pt aware of his prognosis, but did not engage in conversation about equipment needs for home. Pt limiting activity today due to pain. Requires min guard assist and management of lines for OOB activity.  Follow Up Recommendations  No OT follow up    Equipment Recommendations  Wheelchair (measurements OT);Wheelchair cushion (measurements OT) (RW) Hospital bed   Recommendations for Other Services      Precautions / Restrictions Precautions Precautions: Fall Precaution Comments: multiple drains Restrictions Weight Bearing Restrictions: No       Mobility Bed Mobility Overal bed mobility: Needs Assistance Bed Mobility: Supine to Sit     Supine to sit: Min guard;HOB elevated     General bed mobility comments: increased time, no physical assist, assist for drains  Transfers Overall transfer level: Needs assistance Equipment used: Rolling walker (2 wheeled) Transfers: Sit to/from [facility] Sit to Stand: Min guard Stand pivot transfers: Min guard       General transfer comment: assist for drains, min guard for safety    Balance     Sitting balance-Leahy Scale: Good       Standing balance-Leahy Scale: Poor                     ADL Overall ADL's : Needs assistance/impaired     Grooming: Set up;Sitting;Brushing hair                                 General ADL Comments: Pt refusing ambulation, stating it increased his pain after walking in unit last visit. Agreeable to get to chair.       Vision                     Perception     Praxis      Cognition   Behavior During Therapy: WFL for tasks assessed/performed Overall Cognitive Status: Within Functional Limits for tasks assessed (more impulsive today)                       Extremity/Trunk Assessment               Exercises     Shoulder Instructions       General Comments      Pertinent Vitals/ Pain       Pain Assessment: Faces Faces Pain Scale: Hurts even more Pain Location: LE Pain Descriptors / Indicators: Guarding;Grimacing Pain Intervention(s): Monitored during session;PCA encouraged;Repositioned;Limited activity within patient's tolerance  Home Living                                          Prior Functioning/Environment              Frequency  Min 2X/week        Progress Toward Goals  OT Goals(current goals can now be found in the care plan section)  Progress towards OT  goals: Not progressing toward goals - comment (pt with increased pain)  Acute Rehab OT Goals Patient Stated Goal: to return home Time For Goal Achievement: 03/23/16 Potential to Achieve Goals: Good  Plan Discharge plan remains appropriate    Co-evaluation    PT/OT/SLP Co-Evaluation/Treatment: Yes Reason for Co-Treatment: Complexity of the patient's impairments (multi-system involvement)   OT goals addressed during session: ADL's and self-care      End of Session Equipment Utilized During Treatment: Rolling walker   Activity Tolerance Patient limited by pain   Patient Left in chair;with call bell/phone within reach   Nurse Communication  (PCA pump needing attention)        Time: 7628-3151 OT Time Calculation (min): 25 min  Charges: OT General Charges $OT Visit: 1 Procedure OT Treatments $Therapeutic Activity: 8-22 mins  [name] 03/11/2016, 12:07 PM  [phone]

## 2016-03-11 NOTE — Progress Notes (Signed)
Responded to consult for advanced directive. Advised pt, nurse, and AC that it was past the time volunteers were available to witness notarizing form. Discussed form options with pt. Will return Monday to help pt complete form.   03/11/16 1700  Clinical Encounter Type  Visited With Patient  Visit Type Initial;Spiritual support;Social support  Referral From Nurse  Spiritual Encounters  Spiritual Needs Brochure;Other (Comment)  Stress Factors  Patient Stress Factors Health changes (advanced directive)

## 2016-03-11 NOTE — Consult Note (Signed)
[place] Nurse ostomy follow-up consult note: Stoma type/location: LLQ Colostomy; was in place prior to this admission Stomal assessment/size: 1 and 5/8 inch oval ostomy, pale pink, moist, os in center, flush with skin level Peristomal assessment: intact.  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 Current pouch has been in place for 5 days Output: mod amt brown, pasty stool Ostomy pouching: Assisted with pouch change using 2pc. 2 and 1/4 inch pouching system with skin barrier ring Supplies at bedside for patient and staff nurse use.  Pt denies need for further assistance. Please re-consult if further assistance is needed.  Thank-you,  Julien Girt MSN, [place], [place], [place], [place]

## 2016-03-11 NOTE — Progress Notes (Signed)
Subjective: PT HAS PAIN IN LEFT BUTTOCK   Objective: Vital signs in last 24 hours: Temp:  [97.5 F (36.4 C)-98.2 F (36.8 C)] 97.6 F (36.4 C) (11/10 0734) Pulse Rate:  [25-76] 76 (11/10 0411) Resp:  [8-21] 21 (11/10 0858) BP: (104-130)/(65-89) 116/73 (11/10 0734) SpO2:  [84 %-97 %] 91 % (11/10 0858) FiO2 (%):  [28 %] 28 % (11/10 0858) Last BM Date: 03/10/16  Intake/Output from previous day: 11/09 0701 - 11/10 0700 In: 1130 [P.O.:480; IV Piggyback:650] Out: 4325 [Urine:3975; Stool:350] Intake/Output this shift: Total I/O In: -  Out: 925 [Urine:925]  Left buttock erythema stable  No abscess or SQ air  Left leg looks better  Drain out  Candidiasis around scrotum and buttock noted    Lab Results:   Recent Labs  03/10/16 0500 03/11/16 0430  WBC 5.8 6.1  HGB 10.1* 10.6*  HCT 31.9* 34.0*  PLT 420* 447*   BMET  Recent Labs  03/09/16 0452 03/10/16 0500 03/11/16 0430  NA 136 140  --   K 3.7 3.7  --   CL 101 104  --   CO2 27 31  --   GLUCOSE 97 96  --   BUN 7 6  --   CREATININE 0.80 0.90 1.08  CALCIUM 8.1* 7.9*  --    PT/INR No results for input(s): LABPROT, INR in the last 72 hours. ABG No results for input(s): PHART, HCO3 in the last 72 hours.  Invalid input(s): PCO2, PO2  Studies/Results: No results found.  Anti-infectives: Anti-infectives    Start     Dose/Rate Route Frequency Ordered Stop   03/10/16 1700  vancomycin (VANCOCIN) 1,500 mg in sodium chloride 0.9 % 500 mL IVPB  Status:  Discontinued     1,500 mg 250 mL/hr over 120 Minutes Intravenous Every 12 hours 03/10/16 1341 03/10/16 1341   03/10/16 1700  vancomycin (VANCOCIN) 1,250 mg in sodium chloride 0.9 % 250 mL IVPB     1,250 mg 166.7 mL/hr over 90 Minutes Intravenous Every 12 hours 03/10/16 1341     03/08/16 2100  vancomycin (VANCOCIN) IVPB 1000 mg/200 mL premix  Status:  Discontinued     1,000 mg 200 mL/hr over 60 Minutes Intravenous Every 8 hours 03/08/16 1003 03/10/16 1341   03/08/16 1015  vancomycin (VANCOCIN) 2,000 mg in sodium chloride 0.9 % 500 mL IVPB     2,000 mg 250 mL/hr over 120 Minutes Intravenous  Once 03/08/16 1002 03/08/16 1443   03/06/16 0815  piperacillin-tazobactam (ZOSYN) IVPB 3.375 g     3.375 g 12.5 mL/hr over 240 Minutes Intravenous Every 8 hours 03/06/16 0802     03/05/16 2300  clindamycin (CLEOCIN) IVPB 600 mg  Status:  Discontinued     600 mg 100 mL/hr over 30 Minutes Intravenous Every 8 hours 03/04/16 2236 03/05/16 1309   03/04/16 1615  piperacillin-tazobactam (ZOSYN) IVPB 3.375 g     3.375 g 100 mL/hr over 30 Minutes Intravenous  Once 03/04/16 1603 03/04/16 1641   03/04/16 1615  vancomycin (VANCOCIN) IVPB 1000 mg/200 mL premix     1,000 mg 200 mL/hr over 60 Minutes Intravenous  Once 03/04/16 1603 03/04/16 1735      Assessment/Plan: Cellulitis left buttock stable  Agree with palliative care and Hospice care at this point  Would avoid excessive testing / procedures or surgery  Available as needed  If patient has more pain in left buttock / hip can repeat CT Unlikely to change outcome     LOS: 7  days    Andrew Salvador A. 03/11/2016

## 2016-03-11 NOTE — Progress Notes (Signed)
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 amitriptyline  50 mg Oral QHS  . DULoxetine  60 mg Oral Daily  . enoxaparin (LOVENOX) injection  40 mg Subcutaneous Q24H  . fentaNYL  150 mcg Transdermal Q72H  . HYDROmorphone   Intravenous Q4H  . piperacillin-tazobactam (ZOSYN)  IV  3.375 g Intravenous Q8H  . pregabalin  150 mg Oral BID  . senna-docusate  1 tablet Oral BID  . sodium chloride flush  3 mL Intravenous Q12H  . vancomycin  1,250 mg Intravenous Q12H    SUBJECTIVE: He tells me "this is really hard. I am only 50 years young." He is worried about what his 40 year old wife will do when she has to take over all of their affairs when he dies. He is asking when he can go home. He is still having a lot of pain in his left buttocks and thigh. His pelvic drain was removed yesterday.  Review of Systems: Review of Systems  Constitutional: Negative for chills, diaphoresis and fever.  Musculoskeletal:       As noted in history of present illness.  Skin: Positive for itching.  Psychiatric/Behavioral: Positive for depression.    Past Medical History:  Diagnosis Date  . Anxiety   . Arthritis   . Depression   . Kidney stone 03/22/15   left  . Rectal cancer ([place])   . Rectal pain   . Renal cell cancer ([place])    2012.   [name] Shortness of breath dyspnea     Social History  Substance Use Topics  . Smoking status: Current  Every Day Smoker    Packs/day: 0.50    Years: 30.00    Types: Cigarettes  . Smokeless tobacco: Never Used  . Alcohol use 0.0 oz/week     Comment: Occ    Family History  Problem Relation Age of Onset  . Healthy Mother   . Lung cancer Maternal Grandfather   . Colon cancer Maternal Uncle     age 66  . Diabetes Other    Allergies  Allergen Reactions  . Oxaliplatin Itching    OBJECTIVE: Vitals:   03/11/16 0018 03/11/16 0411 03/11/16 0734 03/11/16 0858  BP:  118/72 116/73   Pulse:  76    Resp: (!) 8 12  (!) 21  Temp:  97.9 F (36.6 C) 97.6 F (36.4 C)   TempSrc:  Oral Axillary   SpO2: 92% 90%  91%  Weight:      Height:       Body mass index is 36.28 kg/m.  Physical Exam  Constitutional:  He is slightly groggy but will engage in conversation and is fairly talkative.  Cardiovascular: Normal rate and regular rhythm.   No murmur heard. Pulmonary/Chest: Effort normal and breath sounds normal.  Musculoskeletal:  Induration and erythema over left buttock and down the back of his left thigh.    Lab Results Lab Results  Component Value Date   WBC 6.1 03/11/2016   HGB 10.6 (L) 03/11/2016   HCT 34.0 (L) 03/11/2016   MCV 88.8 03/11/2016   PLT 447 (H) 03/11/2016    Lab Results  Component Value Date   CREATININE 1.08 03/11/2016   BUN 6 03/10/2016   NA 140 03/10/2016   K 3.7 03/10/2016   CL 104 03/10/2016   CO2 31 03/10/2016    Lab Results  Component Value Date   ALT 22 03/06/2016   AST 22 03/06/2016   ALKPHOS 111 03/06/2016   BILITOT 0.5 03/06/2016     Microbiology: Recent Results (from the past 240 hour(s))  Culture, blood (Routine x 2)     Status: None   Collection Time: 03/04/16  4:16 PM  Result Value Ref Range Status   Specimen Description BLOOD RIGHT HAND  Final   Special Requests BOTTLES DRAWN AEROBIC AND ANAEROBIC [place]  Final   Culture NO GROWTH 5 DAYS  Final   Report Status 03/09/2016 FINAL  Final  Culture, blood (Routine x 2)     Status:  None   Collection Time: 03/04/16  4:17 PM  Result Value Ref Range Status   Specimen Description LEFT ANTECUBITAL  Final   Special Requests BOTTLES DRAWN AEROBIC AND ANAEROBIC [place]  Final   Culture NO GROWTH 5 DAYS  Final   Report Status 03/09/2016 FINAL  Final  Urine culture     Status: Abnormal   Collection Time: 03/04/16  4:37 PM  Result Value Ref Range Status   Specimen Description URINE, CLEAN CATCH  Final   Special Requests NONE  Final   Culture 70,000 COLONIES/mL YEAST (A)  Final   Report Status 03/06/2016 FINAL  Final  MRSA PCR Screening     Status: None   Collection Time: 03/04/16 10:37 PM  Result Value Ref Range Status   MRSA by PCR NEGATIVE NEGATIVE Final    Comment:        The GeneXpert MRSA Assay (FDA approved for NASAL specimens only), is one component of a comprehensive MRSA colonization surveillance program. It is not intended to diagnose MRSA infection nor to guide or monitor treatment for MRSA infections.   Culture, blood (x 2)     Status: None   Collection Time: 03/04/16 11:13 PM  Result Value Ref Range Status   Specimen Description BLOOD RIGHT ARM  Final   Special Requests BOTTLES DRAWN AEROBIC AND ANAEROBIC 10ML  Final   Culture NO GROWTH 5 DAYS  Final   Report Status 03/10/2016 FINAL  Final  Culture, blood (x 2)     Status: None   Collection Time: 03/04/16 11:20 PM  Result Value Ref Range Status   Specimen Description BLOOD RIGHT HAND  Final   Special Requests AEROBIC BOTTLE ONLY 6ML  Final   Culture NO GROWTH 5 DAYS  Final   Report Status 03/10/2016 FINAL  Final  Aerobic/Anaerobic Culture (surgical/deep wound)     Status: None   Collection Time: 03/05/16 12:57 PM  Result Value Ref Range Status   Specimen Description ABSCESS ABDOMEN  Final   Special Requests Normal  Final   Gram Stain   Final    FEW WBC PRESENT, PREDOMINANTLY PMN ABUNDANT GRAM NEGATIVE COCCOBACILLI MODERATE GRAM POSITIVE COCCI IN PAIRS    Culture MULTIPLE ORGANISMS  PRESENT, NONE PREDOMINANT  Final  Report Status 03/10/2016 FINAL  Final     ASSESSMENT: He has defervesced on broad empiric antibiotic therapy for his polymicrobial pelvic abscess and left buttock and thigh soft tissue infection. His overall prognosis is extremely poor. It is clear that he is struggling with what he should do given his limited options. He did ask when he could go home and I told him that I feel confident that hospice could help him manage his pain at home if that's what he wanted to do. He said that he would talk with his wife in the hospice team today. If he does decide to move to comfort care would certainly be reasonable to consider stopping all antibiotics. If he wants to continue antibiotics for palliation he could switch to oral ciprofloxacin and amoxicillin clavulanate.  PLAN: 1. Continue current antibiotics for now 2. Please call me for any infectious disease questions this weekend  Michel Bickers, MD [facility] for [place] [phone] pager   [phone] cell 03/11/2016, 11:08 AM

## 2016-03-11 NOTE — Progress Notes (Signed)
Physical Therapy Treatment Patient Details Name: Andrew Kline MRN: [id number] DOB: 1965-09-03 Today's Date: 03/11/2016    History of Present Illness Pt with hx of rectal cancer (s/p colostomy) with mets to lung, renal cell carcinoma-R kidney s/p partial nephrectomy, B nephrostomy drains, suprapubic catheter, presacral abscess, cellulits L LE. Plan now is for home with hospice care.    PT Comments    Pt presented supine in bed with HOB elevated, awake and willing to participate in therapy session. Pt with increased pain this session as compared to previous. He was agreeable to SPT to the recliner only. Pt reported that he wants to go home and is aware of his prognosis. He would not engage in a serious conversation regarding equipment needs for home at this time. Pt would continue to benefit from skilled physical therapy services at this time while admitted to address his limitations to improve his overall safety and independence with functional mobility.    Follow Up Recommendations  Supervision for mobility/OOB     Equipment Recommendations  None recommended by PT    Recommendations for Other Services       Precautions / Restrictions Precautions Precautions: Fall Precaution Comments: multiple drains Restrictions Weight Bearing Restrictions: No    Mobility  Bed Mobility Overal bed mobility: Needs Assistance Bed Mobility: Supine to Sit     Supine to sit: Min guard;HOB elevated     General bed mobility comments: pt required increased time, assist with multiple drains  Transfers Overall transfer level: Needs assistance Equipment used: Rolling walker (2 wheeled) Transfers: Sit to/from [facility] Sit to Stand: Min guard Stand pivot transfers: Min guard       General transfer comment: assist for drains, min guard for safety  Ambulation/Gait             General Gait Details: pt declined ambulation this session secondary to pain   Stairs            Wheelchair Mobility    Modified Rankin (Stroke Patients Only)       Balance Overall balance assessment: Needs assistance Sitting-balance support: Feet supported;No upper extremity supported Sitting balance-Leahy Scale: Good     Standing balance support: During functional activity;Bilateral upper extremity supported Standing balance-Leahy Scale: Poor Standing balance comment: pt reliant on bilateral UEs on RW                    Cognition Arousal/Alertness: Awake/alert Behavior During Therapy: [facility] for tasks assessed/performed;Impulsive Overall Cognitive Status: Within Functional Limits for tasks assessed                      Exercises      General Comments        Pertinent Vitals/Pain Pain Assessment: Faces Faces Pain Scale: Hurts even more Pain Location: generalized Pain Descriptors / Indicators: Grimacing;Guarding Pain Intervention(s): Monitored during session;Repositioned;Limited activity within patient's tolerance;PCA encouraged    Home Living                      Prior Function            PT Goals (current goals can now be found in the care plan section) Acute Rehab PT Goals Patient Stated Goal: to return home PT Goal Formulation: With patient/family Time For Goal Achievement: 03/23/16 Potential to Achieve Goals: Fair Progress towards PT goals: Progressing toward goals    Frequency    Min 3X/week      PT Plan  Current plan remains appropriate    Co-evaluation PT/OT/SLP Co-Evaluation/Treatment: Yes Reason for Co-Treatment: Complexity of the patient's impairments (multi-system involvement);For patient/therapist safety PT goals addressed during session: Mobility/safety with mobility;Balance;Proper use of DME;Strengthening/ROM OT goals addressed during session: ADL's and self-care     End of Session Equipment Utilized During Treatment: Oxygen Activity Tolerance: Patient limited by pain Patient left: in chair;with  call bell/phone within reach     Time: 1120-1145 PT Time Calculation (min) (ACUTE ONLY): 25 min  Charges:  $Therapeutic Activity: 8-22 mins                    G CodesClearnce Sorrel Nikholas Geffre 04/09/16, 12:34 PM Sherie Don, [place], DPT [phone]

## 2016-03-12 DIAGNOSIS — Z515 Encounter for palliative care: Secondary | ICD-10-CM

## 2016-03-12 LAB — CBC
HEMATOCRIT: 33.5 % — AB (ref 39.0–52.0)
HEMOGLOBIN: 10.2 g/dL — AB (ref 13.0–17.0)
MCH: 26.9 pg (ref 26.0–34.0)
MCHC: 30.4 g/dL (ref 30.0–36.0)
MCV: 88.4 fL (ref 78.0–100.0)
Platelets: 459 10*3/uL — ABNORMAL HIGH (ref 150–400)
RBC: 3.79 MIL/uL — ABNORMAL LOW (ref 4.22–5.81)
RDW: 20.3 % — ABNORMAL HIGH (ref 11.5–15.5)
WBC: 6.6 10*3/uL (ref 4.0–10.5)

## 2016-03-12 LAB — BASIC METABOLIC PANEL
Anion gap: 8 (ref 5–15)
BUN: 6 mg/dL (ref 6–20)
CHLORIDE: 102 mmol/L (ref 101–111)
CO2: 31 mmol/L (ref 22–32)
CREATININE: 1.05 mg/dL (ref 0.61–1.24)
Calcium: 8.2 mg/dL — ABNORMAL LOW (ref 8.9–10.3)
GFR calc Af Amer: >60 mL/min (ref >60)
GFR calc non Af Amer: >60 mL/min (ref >60)
Glucose, Bld: 110 mg/dL — ABNORMAL HIGH (ref 65–99)
Potassium: 3.7 mmol/L (ref 3.5–5.1)
Sodium: 141 mmol/L (ref 135–145)

## 2016-03-12 MED ORDER — ENOXAPARIN SODIUM 40 MG/0.4ML ~~LOC~~ SOLN
40.0000 mg | SUBCUTANEOUS | Status: DC
  Administered 2016-03-12 – 2016-03-15 (×4): 40 mg via SUBCUTANEOUS
  Filled 2016-03-12 (×3): qty 0.4

## 2016-03-12 NOTE — Progress Notes (Signed)
Progress Note: General Surgery Service   Subjective: Pain in left leg especially with movement  Objective: Vital signs in last 24 hours: Temp:  [97.7 F (36.5 C)-98.3 F (36.8 C)] 97.7 F (36.5 C) (11/11 0800) Pulse Rate:  [61-120] 64 (11/11 0800) Resp:  [12-22] 22 (11/11 0400) BP: (110-139)/(68-104) 123/70 (11/11 0800) SpO2:  [85 %-96 %] 95 % (11/11 0800) FiO2 (%):  [28 %] 28 % (11/11 0325) Last BM Date: 03/11/16  Intake/Output from previous day: 11/10 0701 - 11/11 0700 In: 2330 [P.O.:1680; IV Piggyback:650] Out: 1914 [Urine:3825] Intake/Output this shift: No intake/output data recorded.  Abd: ostomy in position  Extremities: left lateral aspect with erythema vs rash, dermatographia  Neuro: AOx4  Lab Results: CBC   Recent Labs  03/11/16 0430 03/12/16 0500  WBC 6.1 6.6  HGB 10.6* 10.2*  HCT 34.0* 33.5*  PLT 447* 459*   BMET  Recent Labs  03/10/16 0500 03/11/16 0430 03/12/16 0500  NA 140  --  141  K 3.7  --  3.7  CL 104  --  102  CO2 31  --  31  GLUCOSE 96  --  110*  BUN 6  --  6  CREATININE 0.90 1.08 1.05  CALCIUM 7.9*  --  8.2*   PT/INR No results for input(s): LABPROT, INR in the last 72 hours. ABG No results for input(s): PHART, HCO3 in the last 72 hours.  Invalid input(s): PCO2, PO2  Studies/Results:  Anti-infectives: Anti-infectives    Start     Dose/Rate Route Frequency Ordered Stop   03/10/16 1700  vancomycin (VANCOCIN) 1,500 mg in sodium chloride 0.9 % 500 mL IVPB  Status:  Discontinued     1,500 mg 250 mL/hr over 120 Minutes Intravenous Every 12 hours 03/10/16 1341 03/10/16 1341   03/10/16 1700  vancomycin (VANCOCIN) 1,250 mg in sodium chloride 0.9 % 250 mL IVPB     1,250 mg 166.7 mL/hr over 90 Minutes Intravenous Every 12 hours 03/10/16 1341     03/08/16 2100  vancomycin (VANCOCIN) IVPB 1000 mg/200 mL premix  Status:  Discontinued     1,000 mg 200 mL/hr over 60 Minutes Intravenous Every 8 hours 03/08/16 1003 03/10/16 1341   03/08/16 1015  vancomycin (VANCOCIN) 2,000 mg in sodium chloride 0.9 % 500 mL IVPB     2,000 mg 250 mL/hr over 120 Minutes Intravenous  Once 03/08/16 1002 03/08/16 1443   03/06/16 0815  piperacillin-tazobactam (ZOSYN) IVPB 3.375 g     3.375 g 12.5 mL/hr over 240 Minutes Intravenous Every 8 hours 03/06/16 0802     03/05/16 2300  clindamycin (CLEOCIN) IVPB 600 mg  Status:  Discontinued     600 mg 100 mL/hr over 30 Minutes Intravenous Every 8 hours 03/04/16 2236 03/05/16 1309   03/04/16 1615  piperacillin-tazobactam (ZOSYN) IVPB 3.375 g     3.375 g 100 mL/hr over 30 Minutes Intravenous  Once 03/04/16 1603 03/04/16 1641   03/04/16 1615  vancomycin (VANCOCIN) IVPB 1000 mg/200 mL premix     1,000 mg 200 mL/hr over 60 Minutes Intravenous  Once 03/04/16 1603 03/04/16 1735      Medications: Scheduled Meds: . amitriptyline  50 mg Oral QHS  . DULoxetine  60 mg Oral Daily  . enoxaparin (LOVENOX) injection  40 mg Subcutaneous Q24H  . fentaNYL  175 mcg Transdermal Q72H  . HYDROmorphone   Intravenous Q4H  . piperacillin-tazobactam (ZOSYN)  IV  3.375 g Intravenous Q8H  . pregabalin  150 mg Oral BID  .  senna-docusate  1 tablet Oral BID  . sodium chloride flush  3 mL Intravenous Q12H  . vancomycin  1,250 mg Intravenous Q12H   Continuous Infusions: PRN Meds:.acetaminophen **OR** acetaminophen, albuterol, diphenhydrAMINE **OR** diphenhydrAMINE, ketorolac, naloxone **AND** sodium chloride flush, ondansetron **OR** ondansetron (ZOFRAN) IV, zolpidem  Assessment/Plan: Patient Active Problem List   Diagnosis Date Noted  . Goals of care, counseling/discussion   . DNR (do not resuscitate) discussion   . Pyomyositis: Left gluteus maximus per CT 03/07/2016 03/08/2016  . Sepsis affecting skin ([place])   . Suprapubic catheter ([place])   . Uncontrolled pain   . Palliative care encounter   . Pelvic abscess in male [facility])   . Sepsis ([place]) 03/04/2016  . Renal cell carcinoma ([place]) 03/04/2016  . Hyponatremia  03/04/2016  . Complicated UTI (urinary tract infection) 01/25/2016  . Physical deconditioning 07/07/2015  . Muscular deconditioning 07/07/2015  . Malnutrition of moderate degree ([place]) 02/13/2015  . 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

## 2016-03-12 NOTE — Progress Notes (Signed)
Daily Progress Note   Patient Name: Andrew Kline       Date: 03/12/2016 DOB: 1966/04/10  Age: 50 y.o. MRN#: [id number] Attending Physician: Robbie Lis, MD Primary Care Physician: [name], MD Admit Date: 03/04/2016  Reason for Consultation/Follow-up: Establishing goals of care, Pain control and Psychosocial/spiritual support  Subjective: Pt has used minimal boluses per chart review. He still seems in pain with any movment but he is verbalzing less pain since patch increased. Does not feel overly sedated  Length of Stay: 8  Current Medications: Scheduled Meds:  . amitriptyline  50 mg Oral QHS  . DULoxetine  60 mg Oral Daily  . enoxaparin (LOVENOX) injection  40 mg Subcutaneous Q24H  . fentaNYL  175 mcg Transdermal Q72H  . HYDROmorphone   Intravenous Q4H  . piperacillin-tazobactam (ZOSYN)  IV  3.375 g Intravenous Q8H  . pregabalin  150 mg Oral BID  . senna-docusate  1 tablet Oral BID  . sodium chloride flush  3 mL Intravenous Q12H  . vancomycin  1,250 mg Intravenous Q12H    Continuous Infusions:   PRN Meds: acetaminophen **OR** acetaminophen, albuterol, diphenhydrAMINE **OR** diphenhydrAMINE, ketorolac, naloxone **AND** sodium chloride flush, ondansetron **OR** ondansetron (ZOFRAN) IV, zolpidem  Physical Exam  Constitutional: He is oriented to person, place, and time. He appears well-developed and well-nourished.  Cardiovascular:  irrg  Pulmonary/Chest: Effort normal.  Abdominal:  Suprapubic catheter  Genitourinary:  Genitourinary Comments: bilat nephrostomy tubes  Neurological: He is alert and oriented to person, place, and time.  Skin: Skin is warm and dry. There is erythema.  Erythema to left leg unchanged  Psychiatric: He has a normal mood and  affect. His behavior is normal. Judgment and thought content normal.  Nursing note and vitals reviewed.           Vital Signs: BP 123/70   Pulse 64   Temp 97.7 F (36.5 C) (Oral)   Resp 12   Ht '5\' 5"'$  (1.651 m)   Wt 98.9 kg (218 lb) Comment: Weight taken by RN  SpO2 92%   BMI 36.28 kg/m  SpO2: SpO2: 92 % O2 Device: O2 Device: Nasal Cannula O2 Flow Rate: O2 Flow Rate (L/min): 2 L/min  Intake/output summary:  Intake/Output Summary (Last 24 hours) at 03/12/16 1109 Last data filed at 03/12/16 0900  Gross per  24 hour  Intake             1850 ml  Output             2900 ml  Net            -1050 ml   LBM: Last BM Date: 03/12/16 Baseline Weight: Weight: 81.6 kg (180 lb) Most recent weight: Weight: 98.9 kg (218 lb) (Weight taken by RN)       Palliative Assessment/Data:      Patient Active Problem List   Diagnosis Date Noted  . Goals of care, counseling/discussion   . DNR (do not resuscitate) discussion   . Pyomyositis: Left gluteus maximus per CT 03/07/2016 03/08/2016  . Sepsis affecting skin ([place])   . Suprapubic catheter ([place])   . Uncontrolled pain   . Palliative care encounter   . Pelvic abscess in male [facility])   . Sepsis ([place]) 03/04/2016  . Renal cell carcinoma ([place]) 03/04/2016  . Hyponatremia 03/04/2016  . Complicated UTI (urinary tract infection) 01/25/2016  . Physical deconditioning 07/07/2015  . Muscular deconditioning 07/07/2015  . Malnutrition of moderate degree ([place]) 02/13/2015  . 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 [name]), renal cell carcinoma (s/p right partial nephrectomy and bilateral nephrostomy drains), prior presacral abscess adm 9/25-10/1admitted on 11/3/2017with fever/chills r/t recurrent infection. Drain placed for gas forming  organism in presacral abscess (antibiotics given via ID) along with treatment of cellulitis. 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 Uncontrolled pain and poor prognosis r/ metastatic cancer with recurrent infections prompted palliative consult.    Recommendations/Plan:  Pt wishes to remain a full code at this time. He would not pursue a trach/PEG  Continue to treat the treatable, IVF, PCA, abx, etc. While in the hospital  Considering going home with hospice support when medically ready  Needs HCPOA completed prior to DC. I see that it has been filled out but not notarized or witnessed. Discussed with nurse how to pursue if she is able. Will address again with Spiritual Care on Monday  PMT to stay involved for pain mgt as well as GOC. Will need transition to oral dilaudid prior to DC  Goals of Care and Additional Recommendations:  Limitations on Scope of Treatment: Full Scope Treatment, No Artificial Feeding, No Chemotherapy, No Hemodialysis, No Radiation and No Tracheostomy  Code Status:    Code Status Orders        Start     Ordered   03/04/16 2237  Full code  Continuous     03/04/16 2236    Code Status History    Date Active Date Inactive Code Status Order ID Comments User Context   01/25/2016 11:56 PM 01/26/2016 12:39 PM Full Code [id number]  Oswald Hillock, MD Inpatient   08/27/2015  9:15 AM 08/31/2015  8:10 PM DNR [id number]  Aviva Signs, MD Inpatient   02/11/2015  1:22 PM 02/14/2015  4:09 PM Full Code [id number]  Aviva Signs, MD Inpatient   10/29/2014  2:36 PM 10/30/2014  3:29 AM Full Code [id number]  Arne Cleveland, MD [facility]    Advance Directive Documentation   Flowsheet Row Most Recent Value  Type of Advance Directive  Healthcare Power of Attorney, Living will  Pre-existing out of facility DNR order (yellow form or pink MOST form)  No data  "MOST" Form in Place?  No data       Prognosis:   < 6 months in the setting of renal cell cancer. Shared this with  pt  Discharge Planning:  Home with Hospice likely but PMT to stay involved and verify prior to DC    Thank you for allowing the Palliative Medicine Team to assist in the care of this patient.   Time In: 1030 Time Out: 1110 Total Time 40 min Prolonged Time Billed  no       Greater than 50%  of this time was spent counseling and coordinating care related to the above assessment and plan.  Dory Horn, NP  Please contact Palliative Medicine Team phone at [phone] for questions and concerns.

## 2016-03-12 NOTE — Progress Notes (Addendum)
Patient ID: Andrew Kline, male   DOB: 01/21/1966, 50 y.o.   MRN: [id number]  PROGRESS NOTE    Andrew Kline  MRN:[id number] DOB: 12/14/1965 DOA: 03/04/2016  PCP: Penland,Shannon Kristen, MD   Brief Narrative:  50-year-old male with past medical history of metastatic rectal cancer status post colostomy, renal cell carcinoma of the right kidney, status post partial nephrectomy with bilateral nephrostomy tubes and prior presacral abscesses requiring admission 9/25-10/1 who presented with recurrent infection.  Patient was seen at [place] by general surgery who felt no surgical intervention was needed at that time and that patient will require percutaneous drainage via interventional radiology. Patient underwent CT-guided left transgluteal drain placement with fecal fluid aspirated and culture sent due to concern for fistula to bladder or bowel. General surgery and ID have seen the pt in consultation.  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 Patient has a very complicated GU system with bilateral nephrostomy tubes, suprapubic catheter, colostomy secondary to his metastatic cancer. Patient also noted to have cellulitis/pyomyositis of the left buttocks and upper thigh per CT scan. Antibiotic coverage has been extended to include vancomycin in addition to him being already on zosyn.  Palliative care consulted for goals of care and pain management.    Assessment & Plan:  Sepsis secondary to presacral abscess, status post transgluteal drain placement / Cellulitis and pyomyositis of gluteus maximus and left hip and upper thigh - Sepsis criteria met on the admission with leukocytosis, fever, tachycardia, elevated lactic acid of 3.4 and hypotension Source of sepsis is presacral abscess - CT abdomen and pelvis which was abnormal did show a increasing size of presacral collection now at 6.2 x 5.9 x 9.2 cm newly containing gas. - S/P transgluteal  drain placement 03/05/2016. This was done by interventional radiology. He continues to have erythema around the drain in Compazine entire buttock and down the thigh and the drain itself appears to be draining urine which is consistent with fistula. Will follow-up with surgery if the recommendation is to repeat move the drain considering the cellulitis  - Continue vancomycin and Zosyn.  - The culture from the abscess showed multiple organisms  - Blood cultures negative to date  - Palliative care managing pain   Hyponatremia - Likely secondary to hypovolemic hyponatremia - Sodium level normalized with hydration   History of rectal cancer/renal cell carcinoma status post bilateral nephrostomy tubes - Patient with history of metastatic rectal cancer status post colostomy, renal cell carcinoma of the right kidney status post partial nephrectomy with bilateral nephrostomy tubes - CT abdomen and pelvis with pulmonary metastases and possible liver metastases - Outpt follow up with oncology   Anemia of chronic disease - Secondary to history of malignancy - Hemoglobin stable  Depression - Continue Cymbalta and amitriptyline - Continue Lyrica  Yeast UTI - Urine cultures with 70,000 colonies of yeast.    DVT prophylaxis: Lovenox subQ Code Status: Full Family Communication: No family at bedside.  Disposition Plan: home once pt pain improves   Consultants:   Interventional radiology Dr. Shick 03/05/2016  Infectious diseases Dr. Snider 03/05/2016  General surgery: Dr. Byerly 03/06/2016  Wound care  Palliative care  PT  Procedures:   CT abdomen and pelvis 03/04/2016  Chest x-ray 03/04/2016  CT-guided left transgluteal pelvic drain placement with 50 mL of exudative, fecal fluid aspirated per Dr. Shick 03/05/2016  CT left hip 03/07/2016  Antimicrobials:   IV Zosyn 03/04/2016 1 dose.    IV vancomycin 03/04/2016 1 dose  IV clindamycin 03/05/2016>>> 03/05/2016  IV  Zosyn 03/06/2016 -->   IV vancomycin 03/08/2016 -->    Subjective: No overnight events.   Objective: Vitals:   03/11/16 2350 03/12/16 0000 03/12/16 0325 03/12/16 0400  BP:  135/87 122/68 110/75  Pulse:  61 73 81  Resp: _0 (!) 22  Temp:   98.3 F (36.8 C)   TempSrc:   Oral   SpO2: 90% 92% 91% (!) 85%  Weight:      Height:        Intake/Output Summary (Last 24 hours) at 03/12/16 0752 Last data filed at 03/12/16 3845  Gross per 24 hour  Intake             2330 ml  Output             3825 ml  Net            -1495 ml   Filed Weights   03/04/16 1544 03/04/16 2228  Weight: 81.6 kg (180 lb) 98.9 kg (218 lb)    Examination:  General exam: No distress  Respiratory system: No wheezing, no rhonchi  Cardiovascular system: S1 & S2 heard, rate controlled  Gastrointestinal system: (+) BS, non distended; perc drainage (+), bilateral nephrost tubes, suprapubic cath Central nervous system: Non focal  Extremities: LLE redness laterally on thigh, palpable pulses  Skin: no ulcer or rash  Psychiatry: Normal mood and behavior   Data Reviewed: I have personally reviewed following labs and imaging studies  CBC:  Recent Labs Lab 03/06/16 0121 03/07/16 0319 03/08/16 0224 03/09/16 0451 03/10/16 0500 03/11/16 0430 03/12/16 0500  WBC 9.7 8.6 7.0 6.2 5.8 6.1 6.6  NEUTROABS 8.4* 7.1 5.2 4.4  --   --   --   HGB 10.4* 10.0* 9.9* 10.1* 10.1* 10.6* 10.2*  HCT 31.8* 30.9* 30.6* 32.4* 31.9* 34.0* 33.5*  MCV 84.8 84.0 85.2 86.4 86.4 88.8 88.4  PLT 293 320 363 409* 420* 447* 364*   Basic Metabolic Panel:  Recent Labs Lab 03/06/16 0121 03/07/16 0319 03/08/16 0224 03/09/16 0452 03/10/16 0500 03/11/16 0430 03/12/16 0500  NA 134* 134* 137 136 140  --  141  K 3.6 3.3* 3.5 3.7 3.7  --  3.7  CL 100* 100* 99* 101 104  --  102  CO2 _1 --  31  GLUCOSE 141* 108* 125* 97 96  --  110*  BUN 10 5* 5* 7 6  --  6  CREATININE 0.86 0.75 0.81 0.80 0.90 1.08 1.05  CALCIUM  7.8* 7.8* 8.0* 8.1* 7.9*  --  8.2*  MG 1.8  --   --   --   --   --   --   PHOS  --   --   --  3.7  --   --   --    GFR: Estimated Creatinine Clearance: 89 mL/min (by C-G formula based on SCr of 1.05 mg/dL). Liver Function Tests:  Recent Labs Lab 03/06/16 0121 03/09/16 0452  AST 22  --   ALT 22  --   ALKPHOS 111  --   BILITOT 0.5  --   PROT 5.4*  --   ALBUMIN 1.9* 2.0*   No results for input(s): LIPASE, AMYLASE in the last 168 hours. No results for input(s): AMMONIA in the last 168 hours. Coagulation Profile: No results for input(s): INR, PROTIME in the last 168 hours. Cardiac Enzymes: No results for input(s):  CKTOTAL, CKMB, CKMBINDEX, TROPONINI in the last 168 hours. BNP (last 3 results) No results for input(s): PROBNP in the last 8760 hours. HbA1C: No results for input(s): HGBA1C in the last 72 hours. CBG:  Recent Labs Lab 03/09/16 1248  GLUCAP 166*   Lipid Profile: No results for input(s): CHOL, HDL, LDLCALC, TRIG, CHOLHDL, LDLDIRECT in the last 72 hours. Thyroid Function Tests: No results for input(s): TSH, T4TOTAL, FREET4, T3FREE, THYROIDAB in the last 72 hours. Anemia Panel: No results for input(s): VITAMINB12, FOLATE, FERRITIN, TIBC, IRON, RETICCTPCT in the last 72 hours. Urine analysis:    Component Value Date/Time   COLORURINE YELLOW 03/04/2016 1637   APPEARANCEUR CLEAR 03/04/2016 1637   LABSPEC 1.015 03/04/2016 1637   PHURINE 6.0 03/04/2016 1637   GLUCOSEU NEGATIVE 03/04/2016 1637   HGBUR LARGE (A) 03/04/2016 1637   BILIRUBINUR NEGATIVE 03/04/2016 1637   KETONESUR NEGATIVE 03/04/2016 1637   PROTEINUR 100 (A) 03/04/2016 1637   UROBILINOGEN 0.2 03/17/2015 0910   NITRITE NEGATIVE 03/04/2016 1637   LEUKOCYTESUR LARGE (A) 03/04/2016 1637   Sepsis Labs: _0 (procalcitonin:4,lacticidven:4)   Results for orders placed or performed during the hospital encounter of 03/04/16  Culture, blood (Routine x 2)     Status: None   Collection Time: 03/04/16   4:16 PM  Result Value Ref Range Status   Specimen Description BLOOD RIGHT HAND  Final   Special Requests BOTTLES DRAWN AEROBIC AND ANAEROBIC [place]  Final   Culture NO GROWTH 5 DAYS  Final   Report Status 03/09/2016 FINAL  Final  Culture, blood (Routine x 2)     Status: None   Collection Time: 03/04/16  4:17 PM  Result Value Ref Range Status   Specimen Description LEFT ANTECUBITAL  Final   Special Requests BOTTLES DRAWN AEROBIC AND ANAEROBIC [place]  Final   Culture NO GROWTH 5 DAYS  Final   Report Status 03/09/2016 FINAL  Final  Urine culture     Status: Abnormal   Collection Time: 03/04/16  4:37 PM  Result Value Ref Range Status   Specimen Description URINE, CLEAN CATCH  Final   Special Requests NONE  Final   Culture 70,000 COLONIES/mL YEAST (A)  Final   Report Status 03/06/2016 FINAL  Final  MRSA PCR Screening     Status: None   Collection Time: 03/04/16 10:37 PM  Result Value Ref Range Status   MRSA by PCR NEGATIVE NEGATIVE Final    Comment:        The GeneXpert MRSA Assay (FDA approved for NASAL specimens only), is one component of a comprehensive MRSA colonization surveillance program. It is not intended to diagnose MRSA infection nor to guide or monitor treatment for MRSA infections.   Culture, blood (x 2)     Status: None   Collection Time: 03/04/16 11:13 PM  Result Value Ref Range Status   Specimen Description BLOOD RIGHT ARM  Final   Special Requests BOTTLES DRAWN AEROBIC AND ANAEROBIC 10ML  Final   Culture NO GROWTH 5 DAYS  Final   Report Status 03/10/2016 FINAL  Final  Culture, blood (x 2)     Status: None   Collection Time: 03/04/16 11:20 PM  Result Value Ref Range Status   Specimen Description BLOOD RIGHT HAND  Final   Special Requests AEROBIC BOTTLE ONLY 6ML  Final   Culture NO GROWTH 5 DAYS  Final   Report Status 03/10/2016 FINAL  Final  Aerobic/Anaerobic Culture (surgical/deep wound)     Status: None  Collection Time: 03/05/16 12:57 PM  Result  Value Ref Range Status   Specimen Description ABSCESS ABDOMEN  Final   Special Requests Normal  Final   Gram Stain   Final    FEW WBC PRESENT, PREDOMINANTLY PMN ABUNDANT GRAM NEGATIVE COCCOBACILLI MODERATE GRAM POSITIVE COCCI IN PAIRS    Culture MULTIPLE ORGANISMS PRESENT, NONE PREDOMINANT  Final   Report Status 03/10/2016 FINAL  Final     Radiology Studies: Ct Hip Left W Contrast Result Date: 03/07/2016 Increased stranding in subcutaneous fat about the left buttock and hip consistent with worsened cellulitis. The left gluteal musculature is incompletely imaged but there are a few tiny locules of gas within the gluteus maximus compatible with pyomyositis. No intramuscular abscess is visualized on this examination. Presacral abscess with a drainage catheter in place. Electronically Signed   By: Thomas  Dalessio M.D.   On: 03/07/2016 20:26   Ir Nephrostomy Exchange Left Result Date: 03/08/2016 Successful fluoroscopic bilateral nephrostomy exchanges (10 French) Electronically Signed   By: M.  Shick M.D.   On: 03/08/2016 15:36   Ir Nephrostomy Exchange Right Result Date: 03/08/2016 Successful fluoroscopic bilateral nephrostomy exchanges (10 French) Electronically Signed   By: M.  Shick M.D.   On: 03/08/2016 15:36   Ct Image Guided Drainage By Percutaneous Cathete Result Date: 03/05/2016 Successful CT-guided left trans gluteal presacral abscess drain insertion. Dilute contrast within the abscess cavity from the recent CT, suspicious for fistula from either the bowel or bladder. Electronically Signed   By: M.  Shick M.D.   On: 03/05/2016 13:06     Scheduled Meds: . amitriptyline  50 mg Oral QHS  . DULoxetine  60 mg Oral Daily  . enoxaparin (LOVENOX) injection  40 mg Subcutaneous Q24H  . fentaNYL  175 mcg Transdermal Q72H  . HYDROmorphone   Intravenous Q4H  . piperacillin-tazobactam (ZOSYN)  IV  3.375 g Intravenous Q8H  . pregabalin  150 mg Oral BID  . senna-docusate  1 tablet Oral BID    . sodium chloride flush  3 mL Intravenous Q12H  . vancomycin  1,250 mg Intravenous Q12H   Continuous Infusions:   LOS: 8 days    Time spent: 15 minutes  Greater than 50% of the time spent on counseling and coordinating the care.   DEVINE, ALMA, MD [facility] Pager [phone]  If 7PM-7AM, please contact night-coverage [email or web address] Password [email or web address] 03/12/2016, 7:52 AM     

## 2016-03-13 LAB — CBC
HEMATOCRIT: 35.9 % — AB (ref 39.0–52.0)
HEMOGLOBIN: 10.9 g/dL — AB (ref 13.0–17.0)
MCH: 27.3 pg (ref 26.0–34.0)
MCHC: 30.4 g/dL (ref 30.0–36.0)
MCV: 90 fL (ref 78.0–100.0)
Platelets: 458 10*3/uL — ABNORMAL HIGH (ref 150–400)
RBC: 3.99 MIL/uL — AB (ref 4.22–5.81)
RDW: 20.3 % — ABNORMAL HIGH (ref 11.5–15.5)
WBC: 7.6 10*3/uL (ref 4.0–10.5)

## 2016-03-13 MED ORDER — HYDROMORPHONE 1 MG/ML IV SOLN
INTRAVENOUS | Status: DC
  Administered 2016-03-13 – 2016-03-14 (×3): 0 mg via INTRAVENOUS
  Administered 2016-03-14: 0.25 mg via INTRAVENOUS
  Administered 2016-03-14: 1 mg via INTRAVENOUS
  Administered 2016-03-14: 0 mg via INTRAVENOUS
  Administered 2016-03-15: 0.5 mg via INTRAVENOUS
  Administered 2016-03-15: 0 mg via INTRAVENOUS

## 2016-03-13 NOTE — Progress Notes (Signed)
Referring Physician(s): Dr Neil Crouch  Supervising Physician: Aletta Edouard  Patient Status:  [facility] - In-pt  Chief Complaint: Rectal ca Mets to lung Renal cell ca Presacral abscess    Subjective:  B PCNs intact Output great - yellow Suprapubic cath intact: OP great  Pt still with soreness at PCN sites  Better tho Eating well  prob home with Hospice soon  Allergies: Oxaliplatin  Medications: Prior to Admission medications   Medication Sig Start Date End Date Taking? Authorizing Provider  albuterol (PROVENTIL HFA;VENTOLIN HFA) 108 (90 Base) MCG/ACT inhaler Inhale 2 puffs into the lungs every 6 (six) hours as needed for wheezing or shortness of breath. 05/26/15  Yes Patrici Ranks, MD  amitriptyline (ELAVIL) 50 MG tablet Take 1 tablet (50 mg total) by mouth at bedtime. 12/31/15  Yes Patrici Ranks, MD  docusate sodium (COLACE) 100 MG capsule Take 1 capsule (100 mg total) by mouth every 12 (twelve) hours. Patient taking differently: Take 100 mg by mouth daily as needed for mild constipation or moderate constipation.  09/25/14  Yes Christopher Lawyer, PA-C  DULoxetine (CYMBALTA) 60 MG capsule Take 1 capsule (60 mg total) by mouth daily. 12/31/15  Yes Patrici Ranks, MD  fentaNYL (DURAGESIC - DOSED MCG/HR) 75 MCG/HR Place 2 patches (150 mcg total) onto the skin every 3 (three) days. 02/01/16  Yes Manon Hilding Kefalas, PA-C  HYDROmorphone (DILAUDID) 4 MG tablet Take 1 tablet (4 mg total) by mouth every 4 (four) hours as needed for severe pain. 02/22/16  Yes Manon Hilding Kefalas, PA-C  pregabalin (LYRICA) 75 MG capsule Take 2 capsules (150 mg total) by mouth 2 (two) times daily. 12/31/15  Yes Patrici Ranks, MD  zolpidem (AMBIEN CR) 12.5 MG CR tablet Take 1 tablet (12.5 mg total) by mouth at bedtime as needed for sleep. 12/31/15  Yes Patrici Ranks, MD     Vital Signs: BP 113/65 (BP Location: Right Arm)   Pulse 68   Temp 97.8 F (36.6 C) (Oral)   Resp 11   Ht '5\' 5"'$   (1.651 m)   Wt 218 lb (98.9 kg) Comment: Weight taken by RN  SpO2 96%   BMI 36.28 kg/m   Physical Exam  Constitutional: He is oriented to person, place, and time. He appears well-nourished.  Abdominal: Soft.  Neurological: He is alert and oriented to person, place, and time.  Skin: Skin is warm and dry.  Sites of all catheters are clean and dry No bleeding Tender at PCN sites No sign of infection  Output all: great OP; yellow urine  Psychiatric: He has a normal mood and affect. His behavior is normal.  Nursing note and vitals reviewed.   Imaging: No results found.  Labs:  CBC:  Recent Labs  03/10/16 0500 03/11/16 0430 03/12/16 0500 03/13/16 0625  WBC 5.8 6.1 6.6 7.6  HGB 10.1* 10.6* 10.2* 10.9*  HCT 31.9* 34.0* 33.5* 35.9*  PLT 420* 447* 459* 458*    COAGS:  Recent Labs  09/23/15 0900 11/12/15 0945 01/26/16 1540 03/04/16 2313  INR 1.15 1.12 1.00 1.33  APTT 38*  --   --  41*    BMP:  Recent Labs  03/08/16 0224 03/09/16 0452 03/10/16 0500 03/11/16 0430 03/12/16 0500  NA 137 136 140  --  141  K 3.5 3.7 3.7  --  3.7  CL 99* 101 104  --  102  CO2 '28 27 31  '$ --  31  GLUCOSE 125* 97 96  --  110*  BUN 5* 7 6  --  6  CALCIUM 8.0* 8.1* 7.9*  --  8.2*  CREATININE 0.81 0.80 0.90 1.08 1.05  GFRNONAA >60 >60 >60 >60 >60  GFRAA >60 >60 >60 >60 >60    LIVER FUNCTION TESTS:  Recent Labs  02/09/16 0854 02/23/16 0915 03/04/16 1616 03/06/16 0121 03/09/16 0452  BILITOT 0.2* 0.3 1.3* 0.5  --   AST 18 18 40 22  --   ALT 13* 17 33 22  --   ALKPHOS 99 87 154* 111  --   PROT 7.4 5.8* 7.2 5.4*  --   ALBUMIN 3.5 2.9* 3.1* 1.9* 2.0*    Assessment and Plan:  B PCNs SP catheter All intact and functioning well  Electronically Signed: Chirstina Haan A 03/13/2016, 9:15 AM   I spent a total of 15 Minutes at the the patient's bedside AND on the patient's hospital floor or unit, greater than 50% of which was counseling/coordinating care for SP cath and B  PCNs

## 2016-03-13 NOTE — Progress Notes (Addendum)
Daily Progress Note   Patient Name: Andrew Kline       Date: 03/13/2016 DOB: 11-12-1965  Age: 50 y.o. MRN#: [id number] Attending Physician: Robbie Lis, MD Primary Care Physician: [name], MD Admit Date: 03/04/2016  Reason for Consultation/Follow-up: Non pain symptom management, Pain control and Psychosocial/spiritual support  Subjective: Patient continues to exhibit a lot of pain with movement; relates it to his nephrostomy tubes. He has used 5 mg of Dilaudid in the past 24 hours. Pump is no longer set with a basal rate, bolus only at 0.5 mg every 15 minutes as needed. Observe the patient after pressing PCA button, heart rate drop to 48, CO2 indicator on pump when up to 68%. He's had a very difficult clinical picture to ascertain the type of pain as well as alleviating and aggravating factors. You can slightly  touch the tubes and he sometimes appears to be in exquisite pain and other times seems to tolerate movement, and touching of tubes without this severe pain presentation. Ironically he tells me that his pain is better today, but he looks to be in more pain.  Area around the nephrostomy tubes however is very erythematous with purulent drainage noted around the tubes. However he can touch the area around the tube and he denies that this hurts  Length of Stay: 9  Current Medications: Scheduled Meds:  . amitriptyline  50 mg Oral QHS  . DULoxetine  60 mg Oral Daily  . enoxaparin (LOVENOX) injection  40 mg Subcutaneous Q24H  . fentaNYL  175 mcg Transdermal Q72H  . HYDROmorphone   Intravenous Q4H  . piperacillin-tazobactam (ZOSYN)  IV  3.375 g Intravenous Q8H  . pregabalin  150 mg Oral BID  . senna-docusate  1 tablet Oral BID  . sodium chloride flush  3 mL Intravenous  Q12H  . vancomycin  1,250 mg Intravenous Q12H    Continuous Infusions:   PRN Meds: acetaminophen **OR** acetaminophen, albuterol, diphenhydrAMINE **OR** diphenhydrAMINE, ketorolac, naloxone **AND** sodium chloride flush, ondansetron **OR** ondansetron (ZOFRAN) IV, zolpidem  Physical Exam  Constitutional: He is oriented to person, place, and time. He appears well-developed and well-nourished.  HENT:  Head: Normocephalic and atraumatic.  Neck: Normal range of motion.  Pulmonary/Chest: Effort normal.  Abdominal:  Suprapubic cath  Genitourinary:  Genitourinary Comments: bilat nephrostomy tubes. Area around  tubes is erythematous with green viscous discharge  Musculoskeletal: Normal range of motion.  Neurological: He is alert and oriented to person, place, and time.  Skin: Skin is warm and dry.  Psychiatric: His behavior is normal. Judgment and thought content normal.  Mood labile  Nursing note and vitals reviewed.           Vital Signs: BP 113/65 (BP Location: Right Arm)   Pulse 68   Temp 97.8 F (36.6 C) (Oral)   Resp 11   Ht '5\' 5"'$  (1.651 m)   Wt 98.9 kg (218 lb) Comment: Weight taken by RN  SpO2 96%   BMI 36.28 kg/m  SpO2: SpO2: 96 % O2 Device: O2 Device: Nasal Cannula O2 Flow Rate: O2 Flow Rate (L/min): 2 L/min  Intake/output summary:  Intake/Output Summary (Last 24 hours) at 03/13/16 1127 Last data filed at 03/13/16 0710  Gross per 24 hour  Intake             1610 ml  Output             4800 ml  Net            -3190 ml   LBM: Last BM Date: 03/13/16 Baseline Weight: Weight: 81.6 kg (180 lb) Most recent weight: Weight: 98.9 kg (218 lb) (Weight taken by RN)       Palliative Assessment/Data:      Patient Active Problem List   Diagnosis Date Noted  . Goals of care, counseling/discussion   . DNR (do not resuscitate) discussion   . Pyomyositis: Left gluteus maximus per CT 03/07/2016 03/08/2016  . Renal carcinoma ([place])   . Sepsis affecting skin ([place])   .  Suprapubic catheter ([place])   . Uncontrolled pain   . Palliative care encounter   . Pelvic abscess in male [facility])   . Sepsis ([place]) 03/04/2016  . Renal cell carcinoma ([place]) 03/04/2016  . Hyponatremia 03/04/2016  . Complicated UTI (urinary tract infection) 01/25/2016  . Physical deconditioning 07/07/2015  . Muscular deconditioning 07/07/2015  . Malnutrition of moderate degree ([place]) 02/13/2015  . 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  Patient was seen at [facility] by general surgery who felt no surgical intervention was needed at that time and that patient will require percutaneous drainage via interventional radiology. Patient underwent CT-guided left transgluteal drain placement with fecal fluid aspirated and culture sent due to concern for fistula to bladder or bowel. General surgery and ID have seen the pt in consultation.  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 Patient has a very complicated GU system with bilateral nephrostomy tubes, suprapubic catheter, colostomy secondary to his metastatic cancer. Patient also noted to have cellulitis/pyomyositis of the left buttocks and upper thigh per CT scan. Antibiotic coverage has been extended to include vancomycin in addition to him being already on zosyn.  Palliative care consulted for goals of care and pain management.    Assessment:   Recommendations/Plan:  Recommend the palliative medicine stay involved in terms of pain management specifically we will attempt again to discuss patient with patient's primary  oncologist, Dr. [name]. We'll see if Dr.  Penland has any recommendations plus she prescribes methadone which may more adequately address neuropathic pain in addition to somatic versus fentanyl as a long-acting agent  Also further imaging/workup for potential infection surrounding nephrostomy tubes. Patient shares that tubes were just changed 03/10/2016  Palliative medicine team to discuss possible pain interventionist involvement for pain management  We'll follow-up with spiritual care to make sure that healthcare power of attorney paperwork is completed  In the interim continue fentanyl transdermal 120mg. further reduced demand dose secondary to clinical presentation of decreased heart rate and increased CO2, lethargy, to 0.25 milligrams every 15 minutes as needed  Goals of Care and Additional Recommendations:  Limitations on Scope of Treatment: Full Scope Treatment  Code Status:    Code Status Orders        Start     Ordered   03/04/16 2237  Full code  Continuous     03/04/16 2236    Code Status History    Date Active Date Inactive Code Status Order ID Comments User Context   01/25/2016 11:56 PM 01/26/2016 12:39 PM Full Code [id number] GOswald Hillock MD Inpatient   08/27/2015  9:15 AM 08/31/2015  8:10 PM DNR [id number] MAviva Signs MD Inpatient   02/11/2015  1:22 PM 02/14/2015  4:09 PM Full Code [id number] MAviva Signs MD Inpatient   10/29/2014  2:36 PM 10/30/2014  3:29 AM Full Code [id number] [facility]   Advance Directive Documentation   Flowsheet Row Most Recent Value  Type of Advance Directive  Healthcare Power of Attorney, Living will  Pre-existing out of facility DNR order (yellow form or pink MOST form)  No data  "MOST" Form in Place?  No data       Prognosis:   Unable to determine  Discharge Planning:  Home with HDewy Rosewas discussed with Dr. DCharlies Silvers Thank you for allowing the Palliative Medicine Team to assist in the care of this  patient.   Time In: 1000 Time Out: 1100 Total Time 60 min Prolonged Time Billed  no       Greater than 50%  of this time was spent counseling and coordinating care related to the above assessment and plan.  SDory Horn NP  Please contact Palliative Medicine Team phone at [phone] questions and concerns.

## 2016-03-13 NOTE — Progress Notes (Signed)
Pharmacy Antibiotic Note Andrew Kline is a 50 y.o. male admitted on 03/04/2016 with presacral abscess, s/p TG drain placement on 11/4 in setting of metastatic rectal cancer. Patient is currently on Zosyn and vancomycin.  Vancomycin trough ordered for tomorrow (11/13) to be collected with morning labs. SCr has been stable, but up slightly from when the last trough was collected.   Plan: 1. Zosyn 3.375g IV q8h (4 hour infusion).  2. Vancomycin to 1250 mg IV every 12 hours  3. SCr every 72 hours while on vancomycin    Height: '5\' 5"'$  (165.1 cm) Weight: 218 lb (98.9 kg) (Weight taken by RN) IBW/kg (Calculated) : 61.5  Temp (24hrs), Avg:97.7 F (36.5 C), Min:97 F (36.1 C), Max:98 F (36.7 C)   Recent Labs Lab 03/08/16 0224 03/09/16 0451 03/09/16 0452 03/10/16 0500 03/10/16 1151 03/11/16 0430 03/12/16 0500 03/13/16 0625  WBC 7.0 6.2  --  5.8  --  6.1 6.6 7.6  CREATININE 0.81  --  0.80 0.90  --  1.08 1.05  --   VANCOTROUGH  --   --   --   --  23*  --   --   --     Estimated Creatinine Clearance: 89 mL/min (by C-G formula based on SCr of 1.05 mg/dL).    Allergies  Allergen Reactions  . Oxaliplatin Itching    Antimicrobials this admission:   11/3 Zosyn >>  11/7 vancomycin >>   Dose adjustments this admission:  N/a  Microbiology results:  11/3 BCx: ngtd 11/3 UCx: yeast   11/3 abscess: ngF  Thank you for allowing pharmacy to be a part of this patient's care.  Georga Bora, PharmD Clinical Pharmacist Pager: [phone] 03/13/2016 2:59 PM

## 2016-03-13 NOTE — Progress Notes (Signed)
Patient ID: Andrew Kline, male   DOB: 1966-02-24, 50 y.o.   MRN: [id number]  PROGRESS NOTE    Nayel Purdy  QAS:[id number] DOB: 31-Dec-1965 DOA: 03/04/2016  PCP: [name], MD   Brief Narrative:  50 year old male with past medical history of metastatic rectal cancer status post colostomy, renal cell carcinoma of the right kidney, status post partial nephrectomy with bilateral nephrostomy tubes and prior presacral abscesses requiring admission 9/25-10/1 who presented with recurrent infection.  Patient was seen at [facility] by general surgery who felt no surgical intervention was needed at that time and that patient will require percutaneous drainage via interventional radiology. Patient underwent CT-guided left transgluteal drain placement with fecal fluid aspirated and culture sent due to concern for fistula to bladder or bowel. General surgery and ID have seen the pt in consultation.  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 Patient has a very complicated GU system with bilateral nephrostomy tubes, suprapubic catheter, colostomy secondary to his metastatic cancer. Patient also noted to have cellulitis/pyomyositis of the left buttocks and upper thigh per CT scan. Antibiotic coverage has been extended to include vancomycin in addition to him being already on zosyn.  Palliative care consulted for goals of care and pain management.    Assessment & Plan:  Sepsis secondary to presacral abscess, status post transgluteal drain placement / Cellulitis and pyomyositis of gluteus maximus and left hip and upper thigh - Sepsis criteria met on the admission with leukocytosis, fever, tachycardia, elevated lactic acid of 3.4 and hypotension Source of sepsis is presacral abscess - CT abdomen and pelvis which was abnormal did show a increasing size of presacral collection now at 6.2 x 5.9 x 9.2 cm newly containing gas. - The culture from  the abscess showed multiple organisms. Blood cultures negative to date  - S/P transgluteal drain placement 03/05/2016. This was done by interventional radiology. He continues to have erythema around the drain in Compazine entire buttock and down the thigh and the drain itself appears to be draining urine which is consistent with fistula. Will follow-up with surgery if the recommendation is to repeat move the drain considering the cellulitis  - Continue vancomycin and Zosyn.   Hyponatremia - Likely secondary to hypovolemic hyponatremia - Sodium level normalized with hydration   History of rectal cancer/renal cell carcinoma status post bilateral nephrostomy tubes - Patient with history of metastatic rectal cancer status post colostomy, renal cell carcinoma of the right kidney status post partial nephrectomy with bilateral nephrostomy tubes - CT abdomen and pelvis with pulmonary metastases and possible liver metastases - Outpt follow up with oncology   Anemia of chronic disease - Secondary to history of malignancy - Hemoglobin stable at 10.9  Depression - Continue Cymbalta and amitriptyline - Continue Lyrica  Yeast UTI - Urine cultures with 70,000 colonies of yeast.    DVT prophylaxis: Lovenox subQ Code Status: Full Family Communication: No family at bedside.  Disposition Plan: home once pt pain improves   Consultants:   Interventional radiology Dr. Annamaria Boots 03/05/2016  Infectious diseases Dr. Baxter Flattery 03/05/2016  General surgery: Dr. Barry Dienes 03/06/2016  Wound care  Palliative care  PT  Procedures:   CT abdomen and pelvis 03/04/2016  Chest x-ray 03/04/2016  CT-guided left transgluteal pelvic drain placement with 50 mL of exudative, fecal fluid aspirated per Dr. Annamaria Boots 03/05/2016  CT left hip 03/07/2016  Antimicrobials:   IV Zosyn 03/04/2016 1 dose.  IV vancomycin 03/04/2016 1 dose  IV clindamycin 03/05/2016>>> 03/05/2016  IV Zosyn 03/06/2016 -->   IV  vancomycin 03/08/2016 -->    Subjective: No overnight events.   Objective: Vitals:   03/13/16 0001 03/13/16 0400 03/13/16 0759 03/13/16 0800  BP: (!) 116/55 111/72 113/65   Pulse: 69 68    Resp: _0 Temp: 97.7 F (36.5 C) 97 F (36.1 C) 97.8 F (36.6 C)   TempSrc: Oral Oral Oral   SpO2: 96% 96%  96%  Weight:      Height:        Intake/Output Summary (Last 24 hours) at 03/13/16 1109 Last data filed at 03/13/16 0710  Gross per 24 hour  Intake             1610 ml  Output             4800 ml  Net            -3190 ml   Filed Weights   03/04/16 1544 03/04/16 2228  Weight: 81.6 kg (180 lb) 98.9 kg (218 lb)    Examination:  General exam: No distress, pt calm and comfortable  Respiratory system: No wheezing, no rhonchi  Cardiovascular system: S1 & S2 heard, RRR Gastrointestinal system: (+) BS, non distended; perc drainage (+), bilateral nephrost tubes, suprapubic cath Central nervous system: No focal deficits  Extremities: LLE redness laterally, palpable pulses  Skin: arm and dry  Psychiatry: No agitation, no restlessness   Data Reviewed: I have personally reviewed following labs and imaging studies  CBC:  Recent Labs Lab 03/07/16 0319 03/08/16 0224 03/09/16 0451 03/10/16 0500 03/11/16 0430 03/12/16 0500 03/13/16 0625  WBC 8.6 7.0 6.2 5.8 6.1 6.6 7.6  NEUTROABS 7.1 5.2 4.4  --   --   --   --   HGB 10.0* 9.9* 10.1* 10.1* 10.6* 10.2* 10.9*  HCT 30.9* 30.6* 32.4* 31.9* 34.0* 33.5* 35.9*  MCV 84.0 85.2 86.4 86.4 88.8 88.4 90.0  PLT 320 363 409* 420* 447* 459* 268*   Basic Metabolic Panel:  Recent Labs Lab 03/07/16 0319 03/08/16 0224 03/09/16 0452 03/10/16 0500 03/11/16 0430 03/12/16 0500  NA 134* 137 136 140  --  141  K 3.3* 3.5 3.7 3.7  --  3.7  CL 100* 99* 101 104  --  102  CO2 _1 --  31  GLUCOSE 108* 125* 97 96  --  110*  BUN 5* 5* 7 6  --  6  CREATININE 0.75 0.81 0.80 0.90 1.08 1.05  CALCIUM 7.8* 8.0* 8.1* 7.9*  --  8.2*    PHOS  --   --  3.7  --   --   --    GFR: Estimated Creatinine Clearance: 89 mL/min (by C-G formula based on SCr of 1.05 mg/dL). Liver Function Tests:  Recent Labs Lab 03/09/16 0452  ALBUMIN 2.0*   No results for input(s): LIPASE, AMYLASE in the last 168 hours. No results for input(s): AMMONIA in the last 168 hours. Coagulation Profile: No results for input(s): INR, PROTIME in the last 168 hours. Cardiac Enzymes: No results for input(s): CKTOTAL, CKMB, CKMBINDEX, TROPONINI in the last 168 hours. BNP (last 3 results) No results for input(s): PROBNP in the last 8760 hours. HbA1C: No results for input(s): HGBA1C in the last 72 hours. CBG:  Recent Labs Lab 03/09/16 1248  GLUCAP 166*   Lipid Profile: No results for input(s): CHOL, HDL, LDLCALC, TRIG, CHOLHDL, LDLDIRECT in the last 72 hours. Thyroid Function  Tests: No results for input(s): TSH, T4TOTAL, FREET4, T3FREE, THYROIDAB in the last 72 hours. Anemia Panel: No results for input(s): VITAMINB12, FOLATE, FERRITIN, TIBC, IRON, RETICCTPCT in the last 72 hours. Urine analysis:    Component Value Date/Time   COLORURINE YELLOW 03/04/2016 1637   APPEARANCEUR CLEAR 03/04/2016 1637   LABSPEC 1.015 03/04/2016 1637   PHURINE 6.0 03/04/2016 1637   GLUCOSEU NEGATIVE 03/04/2016 1637   HGBUR LARGE (A) 03/04/2016 1637   BILIRUBINUR NEGATIVE 03/04/2016 1637   KETONESUR NEGATIVE 03/04/2016 1637   PROTEINUR 100 (A) 03/04/2016 1637   UROBILINOGEN 0.2 03/17/2015 0910   NITRITE NEGATIVE 03/04/2016 1637   LEUKOCYTESUR LARGE (A) 03/04/2016 1637   Sepsis Labs: _0 (procalcitonin:4,lacticidven:4)   Results for orders placed or performed during the hospital encounter of 03/04/16  Culture, blood (Routine x 2)     Status: None   Collection Time: 03/04/16  4:16 PM  Result Value Ref Range Status   Specimen Description BLOOD RIGHT HAND  Final   Special Requests BOTTLES DRAWN AEROBIC AND ANAEROBIC [place]  Final   Culture NO GROWTH  5 DAYS  Final   Report Status 03/09/2016 FINAL  Final  Culture, blood (Routine x 2)     Status: None   Collection Time: 03/04/16  4:17 PM  Result Value Ref Range Status   Specimen Description LEFT ANTECUBITAL  Final   Special Requests BOTTLES DRAWN AEROBIC AND ANAEROBIC [place]  Final   Culture NO GROWTH 5 DAYS  Final   Report Status 03/09/2016 FINAL  Final  Urine culture     Status: Abnormal   Collection Time: 03/04/16  4:37 PM  Result Value Ref Range Status   Specimen Description URINE, CLEAN CATCH  Final   Special Requests NONE  Final   Culture 70,000 COLONIES/mL YEAST (A)  Final   Report Status 03/06/2016 FINAL  Final  MRSA PCR Screening     Status: None   Collection Time: 03/04/16 10:37 PM  Result Value Ref Range Status   MRSA by PCR NEGATIVE NEGATIVE Final    Comment:        The GeneXpert MRSA Assay (FDA approved for NASAL specimens only), is one component of a comprehensive MRSA colonization surveillance program. It is not intended to diagnose MRSA infection nor to guide or monitor treatment for MRSA infections.   Culture, blood (x 2)     Status: None   Collection Time: 03/04/16 11:13 PM  Result Value Ref Range Status   Specimen Description BLOOD RIGHT ARM  Final   Special Requests BOTTLES DRAWN AEROBIC AND ANAEROBIC 10ML  Final   Culture NO GROWTH 5 DAYS  Final   Report Status 03/10/2016 FINAL  Final  Culture, blood (x 2)     Status: None   Collection Time: 03/04/16 11:20 PM  Result Value Ref Range Status   Specimen Description BLOOD RIGHT HAND  Final   Special Requests AEROBIC BOTTLE ONLY 6ML  Final   Culture NO GROWTH 5 DAYS  Final   Report Status 03/10/2016 FINAL  Final  Aerobic/Anaerobic Culture (surgical/deep wound)     Status: None   Collection Time: 03/05/16 12:57 PM  Result Value Ref Range Status   Specimen Description ABSCESS ABDOMEN  Final   Special Requests Normal  Final   Gram Stain   Final    FEW WBC PRESENT, PREDOMINANTLY PMN ABUNDANT GRAM  NEGATIVE COCCOBACILLI MODERATE GRAM POSITIVE COCCI IN PAIRS    Culture MULTIPLE ORGANISMS PRESENT, NONE PREDOMINANT  Final  Report Status 03/10/2016 FINAL  Final     Radiology Studies: Ct Hip Left W Contrast Result Date: 03/07/2016 Increased stranding in subcutaneous fat about the left buttock and hip consistent with worsened cellulitis. The left gluteal musculature is incompletely imaged but there are a few tiny locules of gas within the gluteus maximus compatible with pyomyositis. No intramuscular abscess is visualized on this examination. Presacral abscess with a drainage catheter in place. Electronically Signed   By: Inge Rise M.D.   On: 03/07/2016 20:26   Ir Nephrostomy Exchange Left Result Date: 03/08/2016 Successful fluoroscopic bilateral nephrostomy exchanges (10 [place]) Electronically Signed   By: Jerilynn Mages.  Shick M.D.   On: 03/08/2016 15:36   Ir Nephrostomy Exchange Right Result Date: 03/08/2016 Successful fluoroscopic bilateral nephrostomy exchanges ([place]) Electronically Signed   By: Jerilynn Mages.  Shick M.D.   On: 03/08/2016 15:36   Ct Image Guided Drainage By Percutaneous Cathete Result Date: 03/05/2016 Successful CT-guided left trans gluteal presacral abscess drain insertion. Dilute contrast within the abscess cavity from the recent CT, suspicious for fistula from either the bowel or bladder. Electronically Signed   By: Jerilynn Mages.  Shick M.D.   On: 03/05/2016 13:06     Scheduled Meds: . amitriptyline  50 mg Oral QHS  . DULoxetine  60 mg Oral Daily  . enoxaparin (LOVENOX) injection  40 mg Subcutaneous Q24H  . fentaNYL  175 mcg Transdermal Q72H  . HYDROmorphone   Intravenous Q4H  . piperacillin-tazobactam (ZOSYN)  IV  3.375 g Intravenous Q8H  . pregabalin  150 mg Oral BID  . senna-docusate  1 tablet Oral BID  . sodium chloride flush  3 mL Intravenous Q12H  . vancomycin  1,250 mg Intravenous Q12H   Continuous Infusions:   LOS: 9 days    Time spent: 15 minutes  Greater than 50%  of the time spent on counseling and coordinating the care.   Leisa Lenz, MD [facility] Pager [phone]  If 7PM-7AM, please contact night-coverage [email or web address] Password [email or web address] 03/13/2016, 11:09 AM

## 2016-03-13 NOTE — Plan of Care (Signed)
Problem: Safety: Goal: Ability to remain free from injury will improve Outcome: Progressing Patient properly uses callbell system before attempting to get OOB so that staff may assist  Problem: Pain Managment: Goal: General experience of comfort will improve Outcome: Progressing Palliative Care Team is actively working with patient to find best pain regimen for patient. Patient demonstrates competency on when to use the PCA pump and does so as needed.  Problem: Skin Integrity: Goal: Risk for impaired skin integrity will decrease Outcome: Not Progressing Patient frequently denies offers from staff to help him change positions or get to chair in room. Prefers to stay in the bed.  Problem: Tissue Perfusion: Goal: Risk factors for ineffective tissue perfusion will decrease Outcome: Progressing Patient receiving lovenox daily.  Problem: Activity: Goal: Risk for activity intolerance will decrease Outcome: Progressing Patient encouraged to move, ambulate as he can tolerate. Patient denies this assistance at this time.  Problem: Fluid Volume: Goal: Ability to maintain a balanced intake and output will improve Outcome: Progressing Patient has adequate output from all drains and colostomy. Finishes 100% of meals & fluids offered.

## 2016-03-13 NOTE — Progress Notes (Signed)
Bolus dose change to 0.'25mg'$  dilaudid on the PCA pump per new orders from Palliative care.

## 2016-03-14 DIAGNOSIS — M60009 Infective myositis, unspecified site: Secondary | ICD-10-CM

## 2016-03-14 NOTE — Progress Notes (Signed)
When first visited pt, he and girlfriend were awake and still in bed. Planned with pt to return later to execute his AD when executing another nearby. When did, girlfriend was not there, and pt did not wish to finalize living will provisions in her absence. Chaplain will return later so pt can finish AD. Nurse said pt and girlfriend would like to be married now that he is going home on hospice. Pt has 2 grown children, and wishes his girlfriend to make decisions for him.(Although pt calls girlfriend his wife, nurse said girlfriend told her that they've been together a long time, but are not legally married. Will check with Dept Director as to whether they can be married here -- a request handled on a case-by-case basis. Chaplain available for follow-up.   03/14/16 1100  Clinical Encounter Type  Visited With Patient;Patient and family together  Visit Type Follow-up;Psychological support;Spiritual support  Referral From Nurse  Spiritual Encounters  Spiritual Needs Literature;Ritual  Stress Factors  Patient Stress Factors Health changes;Loss of control;Major life changes  Family Stress Factors Family relationships;Health changes

## 2016-03-14 NOTE — Progress Notes (Signed)
Concluded advanced directive with pt, original to pt, copy to healthcare agent and pt file. Chaplain available for follow-up.   03/14/16 1300  Clinical Encounter Type  Visited With Patient and family together  Visit Type Follow-up;Psychological support;Spiritual support;Social support  Referral From Nurse  Spiritual Encounters  Spiritual Needs Brochure;Other (Comment)  Advance Directives (For Healthcare)  Does patient have an advance directive? Yes

## 2016-03-14 NOTE — Progress Notes (Signed)
[facility] for Infectious Disease   Reason for visit: Follow up on gluteal abscess, pyomyositis  Interval History: Day 9 pip/tazo, vancomycin day 6   Physical Exam: Constitutional:  Vitals:   03/14/16 0734 03/14/16 0800  BP: (!) 79/46 (!) 146/93  Pulse:  72  Resp:  12  Temp: 97.4 F (36.3 C)   sleeping Cardiovascular: RRR MS: left leg with edema  Review of Systems: Unable to assess, sleeping  Lab Results  Component Value Date   WBC 7.6 03/13/2016   HGB 10.9 (L) 03/13/2016   HCT 35.9 (L) 03/13/2016   MCV 90.0 03/13/2016   PLT 458 (H) 03/13/2016    Lab Results  Component Value Date   CREATININE 1.05 03/12/2016   BUN 6 03/12/2016   NA 141 03/12/2016   K 3.7 03/12/2016   CL 102 03/12/2016   CO2 31 03/12/2016    Lab Results  Component Value Date   ALT 22 03/06/2016   AST 22 03/06/2016   ALKPHOS 111 03/06/2016     Microbiology: Recent Results (from the past 240 hour(s))  Culture, blood (Routine x 2)     Status: None   Collection Time: 03/04/16  4:16 PM  Result Value Ref Range Status   Specimen Description BLOOD RIGHT HAND  Final   Special Requests BOTTLES DRAWN AEROBIC AND ANAEROBIC [place]  Final   Culture NO GROWTH 5 DAYS  Final   Report Status 03/09/2016 FINAL  Final  Culture, blood (Routine x 2)     Status: None   Collection Time: 03/04/16  4:17 PM  Result Value Ref Range Status   Specimen Description LEFT ANTECUBITAL  Final   Special Requests BOTTLES DRAWN AEROBIC AND ANAEROBIC [place]  Final   Culture NO GROWTH 5 DAYS  Final   Report Status 03/09/2016 FINAL  Final  Urine culture     Status: Abnormal   Collection Time: 03/04/16  4:37 PM  Result Value Ref Range Status   Specimen Description URINE, CLEAN CATCH  Final   Special Requests NONE  Final   Culture 70,000 COLONIES/mL YEAST (A)  Final   Report Status 03/06/2016 FINAL  Final  MRSA PCR Screening     Status: None   Collection Time: 03/04/16 10:37 PM  Result Value Ref Range Status     MRSA by PCR NEGATIVE NEGATIVE Final    Comment:        The GeneXpert MRSA Assay (FDA approved for NASAL specimens only), is one component of a comprehensive MRSA colonization surveillance program. It is not intended to diagnose MRSA infection nor to guide or monitor treatment for MRSA infections.   Culture, blood (x 2)     Status: None   Collection Time: 03/04/16 11:13 PM  Result Value Ref Range Status   Specimen Description BLOOD RIGHT ARM  Final   Special Requests BOTTLES DRAWN AEROBIC AND ANAEROBIC 10ML  Final   Culture NO GROWTH 5 DAYS  Final   Report Status 03/10/2016 FINAL  Final  Culture, blood (x 2)     Status: None   Collection Time: 03/04/16 11:20 PM  Result Value Ref Range Status   Specimen Description BLOOD RIGHT HAND  Final   Special Requests AEROBIC BOTTLE ONLY 6ML  Final   Culture NO GROWTH 5 DAYS  Final   Report Status 03/10/2016 FINAL  Final  Aerobic/Anaerobic Culture (surgical/deep wound)     Status: None   Collection Time: 03/05/16 12:57 PM  Result Value Ref Range  Status   Specimen Description ABSCESS ABDOMEN  Final   Special Requests Normal  Final   Gram Stain   Final    FEW WBC PRESENT, PREDOMINANTLY PMN ABUNDANT GRAM NEGATIVE COCCOBACILLI MODERATE GRAM POSITIVE COCCI IN PAIRS    Culture MULTIPLE ORGANISMS PRESENT, NONE PREDOMINANT  Final   Report Status 03/10/2016 FINAL  Final    Impression/Plan:  1. Presacral Abscess - draining.  On zosyn and vancomycin.   2.  Pyomyositis - on antibiotics and will need to continue.   3. Pain - on pca.  May get hospice.    With above infections, no positive cultures, best option for po at discharge will be to continue with oral doxycyline 100 mg twice a day and augmentin 875 twice a day probably for 1 month.  I will sign off, please call with any questions. thanks

## 2016-03-14 NOTE — Progress Notes (Signed)
Patient ID: Andrew Kline, male   DOB: 1966/04/21, 50 y.o.   MRN: [id number]  PROGRESS NOTE    Andrew Kline  GUY:[id number] DOB: [birth year] DOA: 03/04/2016  PCP: [name], MD   Brief Narrative:  50 year old male with past medical history of metastatic rectal cancer status post colostomy, renal cell carcinoma of the right kidney, status post partial nephrectomy with bilateral nephrostomy tubes and prior presacral abscesses requiring admission 9/25-10/1 who presented with recurrent infection.  Patient was seen at [facility] by general surgery who felt no surgical intervention was needed at that time and that patient will require percutaneous drainage via interventional radiology. Patient underwent CT-guided left transgluteal drain placement with fecal fluid aspirated and culture sent due to concern for fistula to bladder or bowel. General surgery and ID have seen the pt in consultation.  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 Patient has a very complicated GU system with bilateral nephrostomy tubes, suprapubic catheter, colostomy secondary to his metastatic cancer. Patient also noted to have cellulitis/pyomyositis of the left buttocks and upper thigh per CT scan. Antibiotic coverage has been extended to include vancomycin in addition to him being already on zosyn.  Palliative care consulted for goals of care and pain management.   Assessment & Plan:  Sepsis secondary to presacral abscess, status post transgluteal drain placement / Cellulitis and pyomyositis of gluteus maximus and left hip and upper thigh - Sepsis criteria met on the admission with leukocytosis, fever, tachycardia, elevated lactic acid of 3.4 and hypotension - CT abdomen and pelvis which was abnormal did show a increasing size of presacral collection now at 6.2 x 5.9 x 9.2 cm newly containing gas. - Source of sepsis is presacral abscess for which patient  underwent a transgluteal drain placement 03/05/2016. This was done by interventional radiology. He continues to have erythema around the drain in Compazine entire buttock and down the thigh and the drain itself appears to be draining urine which is consistent with fistula. Will follow-up with surgery if the recommendation is to repeat move the drain considering the cellulitis  - Continue vancomycin and Zosyn. Appreciate ID recommendations if Abx can be switched to PO regimen  - The culture from the abscess was re-incubated for better growth  - Blood cultures negative to date  - Continue pain management efforts; asked palliative if PCA can be switched to PO  Hyponatremia - Likely secondary to hypovolemic hyponatremia - Sodium level normalized with hydration   History of rectal cancer/renal cell carcinoma status post bilateral nephrostomy tubes - Patient with history of metastatic rectal cancer status post colostomy, renal cell carcinoma of the right kidney status post partial nephrectomy with bilateral nephrostomy tubes - CT abdomen and pelvis with pulmonary metastases and possible liver metastases - Outpt follow up with oncology   Anemia of chronic disease - Secondary to history of malignancy - Hemoglobin stable at 10.9  Depression - Continue Cymbalta and amitriptyline - Continue Lyrica - Stable mental status   Yeast UTI - Urine cultures with 70,000 colonies of yeast.    DVT prophylaxis: Lovenox Code Status: Full Family Communication: Wife at the bedside this am Disposition Plan: home possibly in next 24-48 hours, hope we can transition to PO pain meds   Consultants:   Interventional radiology Dr. Annamaria Boots 03/05/2016  Infectious diseases Dr. Baxter Flattery 03/05/2016  General surgery: Dr. Barry Dienes 03/06/2016  Wound care  Palliative care  Procedures:   CT abdomen and pelvis  03/04/2016  Chest x-ray 03/04/2016  CT-guided left transgluteal pelvic drain placement with 50 mL  of exudative, fecal fluid aspirated per Dr. Annamaria Boots 03/05/2016  CT left hip 03/07/2016  Antimicrobials:   IV Zosyn 03/04/2016 1 dose.  IV vancomycin 03/04/2016 1 dose  IV clindamycin 03/05/2016>>> 03/05/2016  IV Zosyn 03/06/2016 -->  IV vancomycin 03/08/2016 -->    Subjective: Pain controlled with PCA.  Objective: Vitals:   03/14/16 0440 03/14/16 0727 03/14/16 0734 03/14/16 0800  BP:  140/83 (!) 79/46 (!) 146/93  Pulse:    72  Resp: (!) 8   12  Temp:  (!) 100.8 F (38.2 C) 97.4 F (36.3 C)   TempSrc:  Axillary Axillary   SpO2: 90%   92%  Weight:      Height:        Intake/Output Summary (Last 24 hours) at 03/14/16 1116 Last data filed at 03/14/16 1033  Gross per 24 hour  Intake              890 ml  Output             5325 ml  Net            -4435 ml   Filed Weights   03/04/16 1544 03/04/16 2228  Weight: 81.6 kg (180 lb) 98.9 kg (218 lb)    Examination:  General exam: no distress, pt calm and comfortable  Respiratory system: No wheezing, no rhonchi, bilateral air entry  Cardiovascular system: S1 & S2 heard, Rate controlled   Gastrointestinal system: (+) BS, non tender, non distended  Central nervous system: Nonfocal  Extremities: LLE redness laterally, palpable pulses Skin: other than redness on LLE no other rah or ulcers  Psychiatry: Normal mood and behavior, no agitation or restlessness   Data Reviewed: I have personally reviewed following labs and imaging studies  CBC:  Recent Labs Lab 03/08/16 0224 03/09/16 0451 03/10/16 0500 03/11/16 0430 03/12/16 0500 03/13/16 0625  WBC 7.0 6.2 5.8 6.1 6.6 7.6  NEUTROABS 5.2 4.4  --   --   --   --   HGB 9.9* 10.1* 10.1* 10.6* 10.2* 10.9*  HCT 30.6* 32.4* 31.9* 34.0* 33.5* 35.9*  MCV 85.2 86.4 86.4 88.8 88.4 90.0  PLT 363 409* 420* 447* 459* 633*   Basic Metabolic Panel:  Recent Labs Lab 03/08/16 0224 03/09/16 0452 03/10/16 0500 03/11/16 0430 03/12/16 0500  NA 137 136 140  --  141  K 3.5  3.7 3.7  --  3.7  CL 99* 101 104  --  102  CO2 '28 27 31  ' --  31  GLUCOSE 125* 97 96  --  110*  BUN 5* 7 6  --  6  CREATININE 0.81 0.80 0.90 1.08 1.05  CALCIUM 8.0* 8.1* 7.9*  --  8.2*  PHOS  --  3.7  --   --   --    GFR: Estimated Creatinine Clearance: 89 mL/min (by C-G formula based on SCr of 1.05 mg/dL). Liver Function Tests:  Recent Labs Lab 03/09/16 0452  ALBUMIN 2.0*   No results for input(s): LIPASE, AMYLASE in the last 168 hours. No results for input(s): AMMONIA in the last 168 hours. Coagulation Profile: No results for input(s): INR, PROTIME in the last 168 hours. Cardiac Enzymes: No results for input(s): CKTOTAL, CKMB, CKMBINDEX, TROPONINI in the last 168 hours. BNP (last 3 results) No results for input(s): PROBNP in the last 8760 hours. HbA1C: No results for input(s): HGBA1C in the last 72  hours. CBG:  Recent Labs Lab 03/09/16 1248  GLUCAP 166*   Lipid Profile: No results for input(s): CHOL, HDL, LDLCALC, TRIG, CHOLHDL, LDLDIRECT in the last 72 hours. Thyroid Function Tests: No results for input(s): TSH, T4TOTAL, FREET4, T3FREE, THYROIDAB in the last 72 hours. Anemia Panel: No results for input(s): VITAMINB12, FOLATE, FERRITIN, TIBC, IRON, RETICCTPCT in the last 72 hours. Urine analysis:    Component Value Date/Time   COLORURINE YELLOW 03/04/2016 1637   APPEARANCEUR CLEAR 03/04/2016 1637   LABSPEC 1.015 03/04/2016 1637   PHURINE 6.0 03/04/2016 1637   GLUCOSEU NEGATIVE 03/04/2016 1637   HGBUR LARGE (A) 03/04/2016 1637   BILIRUBINUR NEGATIVE 03/04/2016 1637   KETONESUR NEGATIVE 03/04/2016 1637   PROTEINUR 100 (A) 03/04/2016 1637   UROBILINOGEN 0.2 03/17/2015 0910   NITRITE NEGATIVE 03/04/2016 1637   LEUKOCYTESUR LARGE (A) 03/04/2016 1637   Sepsis Labs: '@LABRCNTIP' (procalcitonin:4,lacticidven:4)   Culture, blood (Routine x 2)     Status: None   Collection Time: 03/04/16  4:16 PM  Result Value Ref Range Status   Specimen Description BLOOD RIGHT  HAND  Final   Special Requests BOTTLES DRAWN AEROBIC AND ANAEROBIC 8CC EACH  Final   Culture NO GROWTH 5 DAYS  Final   Report Status 03/09/2016 FINAL  Final  Culture, blood (Routine x 2)     Status: None   Collection Time: 03/04/16  4:17 PM  Result Value Ref Range Status   Specimen Description LEFT ANTECUBITAL  Final   Special Requests BOTTLES DRAWN AEROBIC AND ANAEROBIC [facility] EACH  Final   Culture NO GROWTH 5 DAYS  Final   Report Status 03/09/2016 FINAL  Final  Urine culture     Status: Abnormal   Collection Time: 03/04/16  4:37 PM  Result Value Ref Range Status   Specimen Description URINE, CLEAN CATCH  Final   Special Requests NONE  Final   Culture 70,000 COLONIES/mL YEAST (A)  Final   Report Status 03/06/2016 FINAL  Final  MRSA PCR Screening     Status: None   Collection Time: 03/04/16 10:37 PM  Result Value Ref Range Status   MRSA by PCR NEGATIVE NEGATIVE Final  Culture, blood (x 2)     Status: None (Preliminary result)   Collection Time: 03/04/16 11:13 PM  Result Value Ref Range Status   Specimen Description BLOOD RIGHT ARM  Final   Special Requests BOTTLES DRAWN AEROBIC AND ANAEROBIC 10ML  Final   Culture NO GROWTH 3 DAYS  Final   Report Status PENDING  Incomplete  Culture, blood (x 2)     Status: None (Preliminary result)   Collection Time: 03/04/16 11:20 PM  Result Value Ref Range Status   Specimen Description BLOOD RIGHT HAND  Final   Special Requests AEROBIC BOTTLE ONLY 6ML  Final   Culture NO GROWTH 3 DAYS  Final   Report Status PENDING  Incomplete  Aerobic/Anaerobic Culture (surgical/deep wound)     Status: None (Preliminary result)   Collection Time: 03/05/16 12:57 PM  Result Value Ref Range Status   Specimen Description ABSCESS ABDOMEN  Final   Special Requests Normal  Final   Gram Stain   Final    FEW WBC PRESENT, PREDOMINANTLY PMN ABUNDANT GRAM NEGATIVE COCCOBACILLI MODERATE GRAM POSITIVE COCCI IN PAIRS    Culture   Final    CULTURE REINCUBATED FOR BETTER  GROWTH HOLDING FOR POSSIBLE ANAEROBE    Report Status PENDING  Incomplete      Radiology Studies: Ct Hip Left W Contrast  Result Date: 03/07/2016 Increased stranding in subcutaneous fat about the left buttock and hip consistent with worsened cellulitis. The left gluteal musculature is incompletely imaged but there are a few tiny locules of gas within the gluteus maximus compatible with pyomyositis. No intramuscular abscess is visualized on this examination. Presacral abscess with a drainage catheter in place. Electronically Signed   By: Inge Rise M.D.   On: 03/07/2016 20:26   Ir Nephrostomy Exchange Left Result Date: 03/08/2016 Successful fluoroscopic bilateral nephrostomy exchanges (10 [place]) Electronically Signed   By: Jerilynn Mages.  Shick M.D.   On: 03/08/2016 15:36   Ir Nephrostomy Exchange Right Result Date: 03/08/2016 Successful fluoroscopic bilateral nephrostomy exchanges ([place]) Electronically Signed   By: Jerilynn Mages.  Shick M.D.   On: 03/08/2016 15:36   Ct Image Guided Drainage By Percutaneous Cathete Result Date: 03/05/2016 Successful CT-guided left trans gluteal presacral abscess drain insertion. Dilute contrast within the abscess cavity from the recent CT, suspicious for fistula from either the bowel or bladder. Electronically Signed   By: Jerilynn Mages.  Shick M.D.   On: 03/05/2016 13:06     Scheduled Meds: . amitriptyline  50 mg Oral QHS  . DULoxetine  60 mg Oral Daily  . enoxaparin (LOVENOX) injection  40 mg Subcutaneous Q24H  . fentaNYL  175 mcg Transdermal Q72H  . HYDROmorphone   Intravenous Q4H  . piperacillin-tazobactam (ZOSYN)  IV  3.375 g Intravenous Q8H  . pregabalin  150 mg Oral BID  . senna-docusate  1 tablet Oral BID  . sodium chloride flush  3 mL Intravenous Q12H  . vancomycin  1,250 mg Intravenous Q12H   Continuous Infusions:   LOS: 10 days    Time spent: 15 minutes  Greater than 50% of the time spent on counseling and coordinating the care.   Leisa Lenz, MD [facility] Pager [phone]  If 7PM-7AM, please contact night-coverage [email or web address] Password [email or web address] 03/14/2016, 11:16 AM

## 2016-03-14 NOTE — Care Management Note (Signed)
Case Management Note  Patient Details  Name: Andrew Kline MRN: [id number] Date of Birth: 01/02/1966  Subjective/Objective:    Pt to discharge home with hospice.  CM provided list of hospice agencies in [place] to significant other - pt calls her his wife and she will be his primary caregiver.  Pt's mother will be arriving on Wednesday to assist with his care.  Significant other requests CM return for decision after she talks with some friends.                       Expected Discharge Plan:  Home w Hospice Care  In-House Referral:  Hospice / Palliative Care  Discharge planning Services  CM Consult  Choice offered to:  Patient, Spouse  Status of Service:  In process, will continue to follow  Girard Cooter, RN 03/14/2016, 1:38 PM

## 2016-03-14 NOTE — Progress Notes (Signed)
Patient ID: Andrew Kline, male   DOB: 12/22/1965, 50 y.o.   MRN: [id number]  [facility] Surgery Progress Note     Subjective: Continued left hip pain Patient's family will be in town tomorrow, plan to discuss plan of care. Considering going home with hospice.  Objective: Vital signs in last 24 hours: Temp:  [97.4 F (36.3 C)-100.8 F (38.2 C)] 97.4 F (36.3 C) (11/13 0734) Pulse Rate:  [69-77] 72 (11/13 0800) Resp:  [8-14] 12 (11/13 0800) BP: (79-146)/(46-118) 146/93 (11/13 0800) SpO2:  [90 %-96 %] 92 % (11/13 0800) Last BM Date: 03/13/16  Intake/Output from previous day: 11/12 0701 - 11/13 0700 In: 890 [P.O.:240; IV Piggyback:650] Out: 4650 [Urine:4650] Intake/Output this shift: No intake/output data recorded.  PE: Gen: Alert, NAD Pulm: Effort normal Abd: Soft, NT/ND. B nephrostomy tubes with clear/yellow fluid in drainage bags Ext: erythema and swelling of LLE improved from last week, it does persist in left buttock/proximal thigh; very tender and painful with moving in bed, previous L buttock drainage site clean  Lab Results:   Recent Labs  03/12/16 0500 03/13/16 0625  WBC 6.6 7.6  HGB 10.2* 10.9*  HCT 33.5* 35.9*  PLT 459* 458*   BMET  Recent Labs  03/12/16 0500  NA 141  K 3.7  CL 102  CO2 31  GLUCOSE 110*  BUN 6  CREATININE 1.05  CALCIUM 8.2*   PT/INR No results for input(s): LABPROT, INR in the last 72 hours. CMP     Component Value Date/Time   NA 141 03/12/2016 0500   K 3.7 03/12/2016 0500   CL 102 03/12/2016 0500   CO2 31 03/12/2016 0500   GLUCOSE 110 (H) 03/12/2016 0500   BUN 6 03/12/2016 0500   CREATININE 1.05 03/12/2016 0500   CALCIUM 8.2 (L) 03/12/2016 0500   PROT 5.4 (L) 03/06/2016 0121   ALBUMIN 2.0 (L) 03/09/2016 0452   AST 22 03/06/2016 0121   ALT 22 03/06/2016 0121   ALKPHOS 111 03/06/2016 0121   BILITOT 0.5 03/06/2016 0121   GFRNONAA >60 03/12/2016 0500   GFRAA >60 03/12/2016 0500   Lipase     Component Value  Date/Time   LIPASE 20 01/25/2016 1805       Studies/Results: No results found.  Anti-infectives: Anti-infectives    Start     Dose/Rate Route Frequency Ordered Stop   03/10/16 1700  vancomycin (VANCOCIN) 1,500 mg in sodium chloride 0.9 % 500 mL IVPB  Status:  Discontinued     1,500 mg 250 mL/hr over 120 Minutes Intravenous Every 12 hours 03/10/16 1341 03/10/16 1341   03/10/16 1700  vancomycin (VANCOCIN) 1,250 mg in sodium chloride 0.9 % 250 mL IVPB     1,250 mg 166.7 mL/hr over 90 Minutes Intravenous Every 12 hours 03/10/16 1341     03/08/16 2100  vancomycin (VANCOCIN) IVPB 1000 mg/200 mL premix  Status:  Discontinued     1,000 mg 200 mL/hr over 60 Minutes Intravenous Every 8 hours 03/08/16 1003 03/10/16 1341   03/08/16 1015  vancomycin (VANCOCIN) 2,000 mg in sodium chloride 0.9 % 500 mL IVPB     2,000 mg 250 mL/hr over 120 Minutes Intravenous  Once 03/08/16 1002 03/08/16 1443   03/06/16 0815  piperacillin-tazobactam (ZOSYN) IVPB 3.375 g     3.375 g 12.5 mL/hr over 240 Minutes Intravenous Every 8 hours 03/06/16 0802     03/05/16 2300  clindamycin (CLEOCIN) IVPB 600 mg  Status:  Discontinued     600 mg 100  mL/hr over 30 Minutes Intravenous Every 8 hours 03/04/16 2236 03/05/16 1309   03/04/16 1615  piperacillin-tazobactam (ZOSYN) IVPB 3.375 g     3.375 g 100 mL/hr over 30 Minutes Intravenous  Once 03/04/16 1603 03/04/16 1641   03/04/16 1615  vancomycin (VANCOCIN) IVPB 1000 mg/200 mL premix     1,000 mg 200 mL/hr over 60 Minutes Intravenous  Once 03/04/16 1603 03/04/16 1735       Assessment/Plan Cellulitis left hip Presacral abscess, s/p TG drain placement on 11/4 - drain out, previous drain site looks good History of rectal cancer/renal cell carcinoma s/p B PCN drain exchanges 11/7 - Shick - in good position and draining well - appreciate palliative and oncology recs  Plan - continued left hip pain, although cellulitis significantly improved from last week; now only  extending down proximal thigh/buttock. Continue antibiotics. Appreciate palliative care recommendations for goals of care and pain management. No surgical intervention indicated.    LOS: 10 days    Jerrye Beavers , [facility] Surgery 03/14/2016, 10:07 AM Pager: [phone] Consults: [phone] Mon-Fri 7:00 am-4:30 pm Sat-Sun 7:00 am-11:30 am

## 2016-03-15 LAB — BASIC METABOLIC PANEL
ANION GAP: 6 (ref 5–15)
BUN: 8 mg/dL (ref 6–20)
CHLORIDE: 102 mmol/L (ref 101–111)
CO2: 32 mmol/L (ref 22–32)
Calcium: 8.7 mg/dL — ABNORMAL LOW (ref 8.9–10.3)
Creatinine, Ser: 1.29 mg/dL — ABNORMAL HIGH (ref 0.61–1.24)
GFR calc non Af Amer: >60 mL/min (ref >60)
Glucose, Bld: 125 mg/dL — ABNORMAL HIGH (ref 65–99)
POTASSIUM: 3.9 mmol/L (ref 3.5–5.1)
SODIUM: 140 mmol/L (ref 135–145)

## 2016-03-15 MED ORDER — AMOXICILLIN-POT CLAVULANATE 875-125 MG PO TABS
1.0000 | ORAL_TABLET | Freq: Two times a day (BID) | ORAL | Status: DC
  Administered 2016-03-15 – 2016-03-16 (×3): 1 via ORAL
  Filled 2016-03-15 (×4): qty 1

## 2016-03-15 MED ORDER — HYDROMORPHONE HCL 2 MG PO TABS
2.0000 mg | ORAL_TABLET | ORAL | Status: DC | PRN
  Administered 2016-03-15: 2 mg via ORAL
  Filled 2016-03-15: qty 1

## 2016-03-15 MED ORDER — DOXYCYCLINE HYCLATE 100 MG PO TABS
100.0000 mg | ORAL_TABLET | Freq: Two times a day (BID) | ORAL | Status: DC
  Administered 2016-03-15 – 2016-03-16 (×3): 100 mg via ORAL
  Filled 2016-03-15 (×3): qty 1

## 2016-03-15 MED ORDER — FENTANYL 25 MCG/HR TD PT72
150.0000 ug | MEDICATED_PATCH | TRANSDERMAL | Status: DC
  Administered 2016-03-15: 150 ug via TRANSDERMAL
  Filled 2016-03-15: qty 2

## 2016-03-15 MED ORDER — SODIUM CHLORIDE 0.9% FLUSH: 10.0000 mL | INTRAVENOUS | Status: DC | PRN

## 2016-03-15 MED ORDER — ALTEPLASE 2 MG IJ SOLR: 2.0000 mg | Freq: Once | INTRAMUSCULAR | Status: DC

## 2016-03-15 NOTE — Progress Notes (Addendum)
Physical Therapy Treatment Patient Details Name: Andrew Kline MRN: [id number] DOB: 30-Mar-1966 Today's Date: 2016/04/11    History of Present Illness Pt with hx of rectal cancer (s/p colostomy) with mets to lung, renal cell carcinoma-R kidney s/p partial nephrectomy, B nephrostomy drains, suprapubic catheter, presacral abscess, cellulits L LE. Plan now is for home with hospice care.    PT Comments    Patient seen in conjunction with OT therapist for family meeting with education OI:ZTIW mobility and self care as well as equipment recommendations. Spoke with patient and girlfriend at length regarding equipment recommendations. Also educated patient on car transfers, mobility and safety. Discussed possible transport home. At this time, feel patient and girlfriend and patient were receptive and demonstrate no further acute PT needs at this time. Will sign off.  Follow Up Recommendations        Equipment Recommendations  Wheelchair (measurements PT);Hospital bed (transport home)    Recommendations for Other Services       Precautions / Restrictions Precautions Precautions: Fall Precaution Comments: multiple drains Restrictions Weight Bearing Restrictions: No    ognition Arousal/Alertness: Awake/alert Behavior During Therapy: WFL for tasks assessed/performed;Impulsive Overall Cognitive Status: Within Functional Limits for tasks assessed                         PT Goals (current goals can now be found in the care plan section) Acute Rehab PT Goals Patient Stated Goal: to return home with GF Progress towards PT goals:  (education complete, d/c home with hospice no further needs)    Frequency           PT Plan Equipment recommendations need to be updated (education complete d/c for home with hospice no further need)    Co-evaluation PT/OT/SLP Co-Evaluation/Treatment: Yes Reason for Co-Treatment: Other (comment) (seen for review of home needs and selfcare  education ) PT goals addressed during session: Other (comment) (equipment needs and self care for home) OT goals addressed during session: Other (comment) (equipment needs and self care)     End of Session     Patient left: in bed;with call bell/phone within reach;with family/visitor present     Time: 5809-9833 PT Time Calculation (min) (ACUTE ONLY): 24 min  Charges:  $Self Care/Home Management: 8-22                    G CodesDuncan Dull 04-11-2016, 11:07 AM Alben Deeds, PT DPT  [phone]

## 2016-03-15 NOTE — Progress Notes (Signed)
05/16/2015 Wasted 14 cc from Dilaudid PCA with Maggie at 1023. Rico Sheehan RN

## 2016-03-15 NOTE — Progress Notes (Signed)
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 Plan now is for home with hospice care.   OT comments  Focus of session on identifying and educating pt and caregiver in use of DME for comfort, safety and ADL. Recommended pt wear elastic waist shorts due to multiple drains and modifying activities using DME for energy conservation. Plan is for pt to d/c home with hospice care and assist of his mother and girlfriend. No further OT needs.   Follow Up Recommendations  No OT follow up    Equipment Recommendations  3 in 1 bedside comode;Tub/shower bench;Wheelchair (measurements OT);Wheelchair cushion (measurements OT);Hospital bed (half rails on hospital bed)    Recommendations for Other Services      Precautions / Restrictions Precautions Precautions: Fall Precaution Comments: multiple drains Restrictions Weight Bearing Restrictions: No       Mobility Bed Mobility                  Transfers                      Balance                                   ADL                                         General ADL Comments: Educated pt and caregiver in use of tub transfer bench and recommended hand held shower head. Caregiver with concerns about bed height at home. Recommended hospital bed placed in living area with half rails. Pt and caregiver also agreeable to manual w/c with cushion. Educated in use of 3 in 1 in front of sink for sponge bathing and seated grooming.      Vision                     Perception     Praxis      Cognition   Behavior During Therapy: [facility] for tasks  assessed/performed;Impulsive Overall Cognitive Status: Within Functional Limits for tasks assessed                       Extremity/Trunk Assessment               Exercises     Shoulder Instructions       General Comments      Pertinent Vitals/ Pain       Pain Assessment: Faces Faces Pain Scale: No hurt  Home Living                                          Prior Functioning/Environment              Frequency           Progress Toward Goals  OT Goals(current goals can now be found in the care plan section)  Progress towards OT goals: Goals met/education completed, patient discharged  from OT  Acute Rehab OT Goals Patient Stated Goal: to return home with GF  Plan  (Education completed.)    Co-evaluation    PT/OT/SLP Co-Evaluation/Treatment: Yes Reason for Co-Treatment:  (for assessment and education in home management) PT goals addressed during session: Other (comment) (equipment needs and self care for home) OT goals addressed during session: ADL's and self-care      End of Session     Activity Tolerance Patient tolerated treatment well   Patient Left in bed;with call bell/phone within reach;with family/visitor present   Nurse Communication          Time: 6160-7371 OT Time Calculation (min): 17 min  Charges: OT General Charges $OT Visit: 1 Procedure OT Treatments $Self Care/Home Management : 8-22 mins  [name] 03/15/2016, 11:45 AM [phone]

## 2016-03-15 NOTE — Progress Notes (Signed)
Patient ID: Andrew Kline, male   DOB: 12-Jul-1965, 50 y.o.   MRN: [id number]  PROGRESS NOTE    Andrew Kline  KGM:[id number] DOB: 1965-05-09 DOA: 03/04/2016  PCP: [name], MD   Brief Narrative:  50 year old male with past medical history of metastatic rectal cancer status post colostomy, renal cell carcinoma of the right kidney, status post partial nephrectomy with bilateral nephrostomy tubes and prior presacral abscesses requiring admission 9/25-10/1 who presented with recurrent infection.  Patient was seen at [facility] by general surgery who felt no surgical intervention was needed at that time and that patient will require percutaneous drainage via interventional radiology. Patient underwent CT-guided left transgluteal drain placement with fecal fluid aspirated and culture sent due to concern for fistula to bladder or bowel. General surgery and ID have seen the pt in consultation.  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 Patient has a very complicated GU system with bilateral nephrostomy tubes, suprapubic catheter, colostomy secondary to his metastatic cancer. Patient also noted to have cellulitis/pyomyositis of the left buttocks and upper thigh per CT scan. Antibiotic coverage has been extended to include vancomycin in addition to him being already on zosyn. Abx eventually switched to PO Augmentin and doxycycline 11/13. Palliative care consulted for goals of care and pain management.   Assessment & Plan:  Sepsis secondary to presacral abscess, status post transgluteal drain placement / Cellulitis and pyomyositis of gluteus maximus and left hip and upper thigh - Sepsis criteria met on the admission with leukocytosis, fever, tachycardia, elevated lactic acid of 3.4 and hypotension - CT abdomen and pelvis which was abnormal did show a increasing size of presacral collection now at 6.2 x 5.9 x 9.2 cm newly containing  gas. - Source of sepsis is presacral abscess for which patient underwent a transgluteal drain placement 03/05/2016. This was done by interventional radiology. He continues to have erythema around the drain in Compazine entire buttock and down the thigh and the drain itself appears to be draining urine which is consistent with fistula. Will follow-up with surgery if the recommendation is to repeat move the drain considering the cellulitis  - Per ID, abx switched to Augmentin and doxy which pt will continue for 1 month (started 11/13 through 12/13)  - The culture from the abscess was re-incubated for better growth  - Blood cultures negative to date  - Continue pain management efforts; asked palliative if PCA can be switched to PO  Hyponatremia - Likely secondary to hypovolemic hyponatremia - Sodium level normalized with hydration   History of rectal cancer/renal cell carcinoma status post bilateral nephrostomy tubes - Patient with history of metastatic rectal cancer status post colostomy, renal cell carcinoma of the right kidney status post partial nephrectomy with bilateral nephrostomy tubes - CT abdomen and pelvis with pulmonary metastases and possible liver metastases - Outpt follow up with oncology   Anemia of chronic disease - Secondary to history of malignancy - Hemoglobin stable at 10.9  Depression - Continue Cymbalta and amitriptyline - Continue Lyrica - Stable mental status   Yeast UTI - Urine cultures with 70,000 colonies of yeast.    DVT prophylaxis: Lovenox Code Status: Full Family Communication: Wife at the bedside this am Disposition Plan: home possibly tomorrow, hope he can be switched to PO pain meds    Consultants:   Interventional radiology Dr. Annamaria Boots 03/05/2016  Infectious diseases Dr. Baxter Flattery 03/05/2016  General surgery: Dr. Barry Dienes 03/06/2016  Wound care  Palliative care  Procedures:   CT abdomen and pelvis 03/04/2016  Chest x-ray  03/04/2016  CT-guided left transgluteal pelvic drain placement with 50 mL of exudative, fecal fluid aspirated per Dr. Annamaria Boots 03/05/2016  CT left hip 03/07/2016  Antimicrobials:   IV Zosyn 03/04/2016 1 dose.  IV vancomycin 03/04/2016 1 dose  IV clindamycin 03/05/2016>>> 03/05/2016  IV Zosyn 03/06/2016 --> 03/14/2016  IV vancomycin 03/08/2016 --> 03/14/2016  Augmentin and doxycycline 03/14/2016 --> 121/13/2017     Subjective: Pain controlled with PCA.  Objective: Vitals:   03/14/16 2045 03/15/16 0000 03/15/16 0400 03/15/16 0751  BP:  (!) 110/57 110/64   Pulse:      Resp: '15 14 17 ' (P) 15  Temp:  98.6 F (37 C) 98.2 F (36.8 C)   TempSrc:  Axillary Oral   SpO2: 95% 90% 91%   Weight:      Height:        Intake/Output Summary (Last 24 hours) at 03/15/16 0908 Last data filed at 03/15/16 0700  Gross per 24 hour  Intake              760 ml  Output             5375 ml  Net            -4615 ml   Filed Weights   03/04/16 1544 03/04/16 2228  Weight: 81.6 kg (180 lb) 98.9 kg (218 lb)    Examination:  General exam: calm and comfortable, no distress  Respiratory system: bilateral air entry, no wheezing  Cardiovascular system: S1 & S2 heard, RRR Gastrointestinal system: (+) BS, no tenderness, no distention  Central nervous system: No focal deficits  Extremities: LLE redness laterally, palpable pulses Skin: other than redness on LLE no other rah or ulcers  Psychiatry: Normal mood and behavior  Data Reviewed: I have personally reviewed following labs and imaging studies  CBC:  Recent Labs Lab 03/09/16 0451 03/10/16 0500 03/11/16 0430 03/12/16 0500 03/13/16 0625  WBC 6.2 5.8 6.1 6.6 7.6  NEUTROABS 4.4  --   --   --   --   HGB 10.1* 10.1* 10.6* 10.2* 10.9*  HCT 32.4* 31.9* 34.0* 33.5* 35.9*  MCV 86.4 86.4 88.8 88.4 90.0  PLT 409* 420* 447* 459* 291*   Basic Metabolic Panel:  Recent Labs Lab 03/09/16 0452 03/10/16 0500 03/11/16 0430 03/12/16 0500   NA 136 140  --  141  K 3.7 3.7  --  3.7  CL 101 104  --  102  CO2 27 31  --  31  GLUCOSE 97 96  --  110*  BUN 7 6  --  6  CREATININE 0.80 0.90 1.08 1.05  CALCIUM 8.1* 7.9*  --  8.2*  PHOS 3.7  --   --   --    GFR: Estimated Creatinine Clearance: 89 mL/min (by C-G formula based on SCr of 1.05 mg/dL). Liver Function Tests:  Recent Labs Lab 03/09/16 0452  ALBUMIN 2.0*   No results for input(s): LIPASE, AMYLASE in the last 168 hours. No results for input(s): AMMONIA in the last 168 hours. Coagulation Profile: No results for input(s): INR, PROTIME in the last 168 hours. Cardiac Enzymes: No results for input(s): CKTOTAL, CKMB, CKMBINDEX, TROPONINI in the last 168 hours. BNP (last 3 results) No results for input(s): PROBNP in the last 8760 hours. HbA1C: No results for input(s): HGBA1C in the last 72 hours. CBG:  Recent Labs Lab 03/09/16 1248  GLUCAP 166*  Lipid Profile: No results for input(s): CHOL, HDL, LDLCALC, TRIG, CHOLHDL, LDLDIRECT in the last 72 hours. Thyroid Function Tests: No results for input(s): TSH, T4TOTAL, FREET4, T3FREE, THYROIDAB in the last 72 hours. Anemia Panel: No results for input(s): VITAMINB12, FOLATE, FERRITIN, TIBC, IRON, RETICCTPCT in the last 72 hours. Urine analysis:    Component Value Date/Time   COLORURINE YELLOW 03/04/2016 1637   APPEARANCEUR CLEAR 03/04/2016 1637   LABSPEC 1.015 03/04/2016 1637   PHURINE 6.0 03/04/2016 1637   GLUCOSEU NEGATIVE 03/04/2016 1637   HGBUR LARGE (A) 03/04/2016 1637   BILIRUBINUR NEGATIVE 03/04/2016 1637   KETONESUR NEGATIVE 03/04/2016 1637   PROTEINUR 100 (A) 03/04/2016 1637   UROBILINOGEN 0.2 03/17/2015 0910   NITRITE NEGATIVE 03/04/2016 1637   LEUKOCYTESUR LARGE (A) 03/04/2016 1637   Sepsis Labs: '@LABRCNTIP' (procalcitonin:4,lacticidven:4)   Culture, blood (Routine x 2)     Status: None   Collection Time: 03/04/16  4:16 PM  Result Value Ref Range Status   Specimen Description BLOOD RIGHT HAND   Final   Special Requests BOTTLES DRAWN AEROBIC AND ANAEROBIC 8CC EACH  Final   Culture NO GROWTH 5 DAYS  Final   Report Status 03/09/2016 FINAL  Final  Culture, blood (Routine x 2)     Status: None   Collection Time: 03/04/16  4:17 PM  Result Value Ref Range Status   Specimen Description LEFT ANTECUBITAL  Final   Special Requests BOTTLES DRAWN AEROBIC AND ANAEROBIC [facility] EACH  Final   Culture NO GROWTH 5 DAYS  Final   Report Status 03/09/2016 FINAL  Final  Urine culture     Status: Abnormal   Collection Time: 03/04/16  4:37 PM  Result Value Ref Range Status   Specimen Description URINE, CLEAN CATCH  Final   Special Requests NONE  Final   Culture 70,000 COLONIES/mL YEAST (A)  Final   Report Status 03/06/2016 FINAL  Final  MRSA PCR Screening     Status: None   Collection Time: 03/04/16 10:37 PM  Result Value Ref Range Status   MRSA by PCR NEGATIVE NEGATIVE Final  Culture, blood (x 2)     Status: None (Preliminary result)   Collection Time: 03/04/16 11:13 PM  Result Value Ref Range Status   Specimen Description BLOOD RIGHT ARM  Final   Special Requests BOTTLES DRAWN AEROBIC AND ANAEROBIC 10ML  Final   Culture NO GROWTH 3 DAYS  Final   Report Status PENDING  Incomplete  Culture, blood (x 2)     Status: None (Preliminary result)   Collection Time: 03/04/16 11:20 PM  Result Value Ref Range Status   Specimen Description BLOOD RIGHT HAND  Final   Special Requests AEROBIC BOTTLE ONLY 6ML  Final   Culture NO GROWTH 3 DAYS  Final   Report Status PENDING  Incomplete  Aerobic/Anaerobic Culture (surgical/deep wound)     Status: None (Preliminary result)   Collection Time: 03/05/16 12:57 PM  Result Value Ref Range Status   Specimen Description ABSCESS ABDOMEN  Final   Special Requests Normal  Final   Gram Stain   Final    FEW WBC PRESENT, PREDOMINANTLY PMN ABUNDANT GRAM NEGATIVE COCCOBACILLI MODERATE GRAM POSITIVE COCCI IN PAIRS    Culture   Final    CULTURE REINCUBATED FOR BETTER  GROWTH HOLDING FOR POSSIBLE ANAEROBE    Report Status PENDING  Incomplete      Radiology Studies: Ct Hip Left W Contrast Result Date: 03/07/2016 Increased stranding in subcutaneous fat about the left buttock and  hip consistent with worsened cellulitis. The left gluteal musculature is incompletely imaged but there are a few tiny locules of gas within the gluteus maximus compatible with pyomyositis. No intramuscular abscess is visualized on this examination. Presacral abscess with a drainage catheter in place. Electronically Signed   By: Inge Rise M.D.   On: 03/07/2016 20:26   Ir Nephrostomy Exchange Left Result Date: 03/08/2016 Successful fluoroscopic bilateral nephrostomy exchanges (10 [place]) Electronically Signed   By: Jerilynn Mages.  Shick M.D.   On: 03/08/2016 15:36   Ir Nephrostomy Exchange Right Result Date: 03/08/2016 Successful fluoroscopic bilateral nephrostomy exchanges ([place]) Electronically Signed   By: Jerilynn Mages.  Shick M.D.   On: 03/08/2016 15:36   Ct Image Guided Drainage By Percutaneous Cathete Result Date: 03/05/2016 Successful CT-guided left trans gluteal presacral abscess drain insertion. Dilute contrast within the abscess cavity from the recent CT, suspicious for fistula from either the bowel or bladder. Electronically Signed   By: Jerilynn Mages.  Shick M.D.   On: 03/05/2016 13:06     Scheduled Meds: . alteplase  2 mg Intracatheter Once  . amitriptyline  50 mg Oral QHS  . DULoxetine  60 mg Oral Daily  . enoxaparin (LOVENOX) injection  40 mg Subcutaneous Q24H  . fentaNYL  150 mcg Transdermal Q72H  . piperacillin-tazobactam (ZOSYN)  IV  3.375 g Intravenous Q8H  . pregabalin  150 mg Oral BID  . senna-docusate  1 tablet Oral BID  . sodium chloride flush  3 mL Intravenous Q12H  . vancomycin  1,250 mg Intravenous Q12H   Continuous Infusions:   LOS: 11 days    Time spent: 15 minutes  Greater than 50% of the time spent on counseling and coordinating the care.   Leisa Lenz,  MD [facility] Pager [phone]  If 7PM-7AM, please contact night-coverage [email or web address] Password [email or web address] 03/15/2016, 9:08 AM

## 2016-03-15 NOTE — Progress Notes (Addendum)
Daily Progress Note   Patient Name: Andrew Kline       Date: 03/15/2016 DOB: [birth year]  Age: 50 y.o. MRN#: [id number] Attending Physician: Robbie Lis, MD Primary Care Physician: [name], MD Admit Date: 03/04/2016  Reason for Consultation/Follow-up: Disposition, Establishing goals of care, Non pain symptom management, Pain control and Psychosocial/spiritual support  Subjective: Andrew Kline appears more comfortable compared to my last visit on 11/12. He was repositioning himself in bed and interactive with no verbalized or behavioral indicators of discomfort. He used total of '2mg'$  IV dilaudid via PCA in the past 24 hours. Today, he had no complaints or questions. We discussed pain medication adjustment (noted below), which he felt comfortable with.   Length of Stay: 11  Current Medications: Scheduled Meds:  . alteplase  2 mg Intracatheter Once  . amitriptyline  50 mg Oral QHS  . DULoxetine  60 mg Oral Daily  . enoxaparin (LOVENOX) injection  40 mg Subcutaneous Q24H  . fentaNYL  150 mcg Transdermal Q72H  . piperacillin-tazobactam (ZOSYN)  IV  3.375 g Intravenous Q8H  . pregabalin  150 mg Oral BID  . senna-docusate  1 tablet Oral BID  . sodium chloride flush  3 mL Intravenous Q12H  . vancomycin  1,250 mg Intravenous Q12H    Continuous Infusions:   PRN Meds: acetaminophen **OR** acetaminophen, albuterol, HYDROmorphone, ondansetron **OR** ondansetron (ZOFRAN) IV, sodium chloride flush, zolpidem  Physical Exam  Constitutional: He is oriented to person, place, and time. He appears well-developed and well-nourished. He has a sickly appearance.  HENT:  Head: Normocephalic and atraumatic.  Neck: Normal range of motion.  Pulmonary/Chest: Effort normal.  Abdominal:    Suprapubic cath, colostomy  Genitourinary:  Genitourinary Comments: bilat nephrostomy tubes. Milky white discharge from penis.   Musculoskeletal: Normal range of motion.  Neurological: He is alert and oriented to person, place, and time.  Skin: Skin is warm and dry.  Erythema and skin irritation on bilateral inner thighs/groin. Left leg edematous.   Psychiatric: His behavior is normal. Thought content normal. His speech is tangential. Cognition and memory are normal. He expresses impulsivity.  Flat affect. Easily distracted with tangential thinking.  Nursing note and vitals reviewed.           Vital Signs: BP 110/64   Pulse 73   Temp 98.2  F (36.8 C) (Oral)   Resp (P) 15   Ht '5\' 5"'$  (1.651 m)   Wt 98.9 kg (218 lb) Comment: Weight taken by RN  SpO2 91%   BMI 36.28 kg/m  SpO2: SpO2: 91 % O2 Device: O2 Device: (P) Nasal Cannula O2 Flow Rate: O2 Flow Rate (L/min): (P) 2.5 L/min  Intake/output summary:   Intake/Output Summary (Last 24 hours) at 03/15/16 0934 Last data filed at 03/15/16 0700  Gross per 24 hour  Intake              760 ml  Output             5375 ml  Net            -4615 ml   LBM: Last BM Date: 03/15/16 Baseline Weight: Weight: 81.6 kg (180 lb) Most recent weight: Weight: 98.9 kg (218 lb) (Weight taken by RN)       Palliative Assessment/Data:  PPS 50%    Patient Active Problem List   Diagnosis Date Noted  . Goals of care, counseling/discussion   . DNR (do not resuscitate) discussion   . Pyomyositis: Left gluteus maximus per CT 03/07/2016 03/08/2016  . Renal carcinoma ([place])   . Sepsis affecting skin ([place])   . Suprapubic catheter ([place])   . Uncontrolled pain   . Palliative care encounter   . Pelvic abscess in male [facility])   . Sepsis ([place]) 03/04/2016  . Renal cell carcinoma ([place]) 03/04/2016  . Hyponatremia 03/04/2016  . Complicated UTI (urinary tract infection) 01/25/2016  . Physical deconditioning 07/07/2015  . Muscular deconditioning 07/07/2015  .  Malnutrition of moderate degree ([place]) 02/13/2015  . Rectal cancer metastasized to lung [facility]) 10/30/2014    Palliative Care Assessment & Plan   Patient Profile: 50 year old male with past medical history of metastatic rectal cancer status post colostomy, renal cell carcinoma of the right kidney, status post partial nephrectomy with bilateral nephrostomy tubes and prior presacral abscesses requiring admission 9/25-10/1 who presented with recurrent infection.   Patient was seen at [facility] by general surgery who felt no surgical intervention was needed at that time and that patient will require percutaneous drainage via interventional radiology.  Patient underwent CT-guided left transgluteal drain placement with fecal fluid aspirated and culture sent due to concern for fistula to bladder or bowel. General surgery and ID have seen the pt in consultation.   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 Patient has a very complicated GU system with bilateral nephrostomy tubes, suprapubic catheter, colostomy secondary to his metastatic cancer. Patient also noted to have cellulitis/pyomyositis of the left buttocks and upper thigh per CT scan. He was on IV abx, now transitioned to PO Augmentin and Doxycycline. Palliative care was consulted for goals of care and pain management.  Assessment: I met with Andrew Kline in conjunction with Wadie Lessen, NP . Today, he was more interactive and demonstrated none of the previous acute pain flares he had on 11/12. His overall use of the PCA has also decreased, now down to '2mg'$  IV Dilaudid in 24 hours. We discussed the necessity of utilizing an oral based regimen in preparation for discharge, which he was amendable to. Finally, we explored his understanding of his disease process, and his expectations for when he was discharged and would return home.   He verbalized a clear understanding that his disease has  continued to progress despite treatment, and that he was  told there are no further treatment options for him. While he feels the lack of treatment options may not be accurate, he accepts it. He reports that he has not seen his Oncologist, Dr. [name], since April.   In terms of his expectations after discharge, he agrees with utilizing Hospice services at home and understands they are symptom focused, not disease focused. We brought up the importance of thinking about what interventions he would want at the end of his life--CPR, shock, intubation, etc.--and the likelihood that, in his case, these interventions would not prolong his life or aid him in his goal of being at home with his girlfriend. We will re-address this further in follow-up tomorrow.   Wadie Lessen was able to reach Dr. [name]. She is in agreement with Hospice and agrees to be the Attending on Record.   Recommendations/Plan: -Pain medication adjusted:   PCA stopped  Fentanyl decreased to 15mg (his home dose)  Started Dilaudid '2mg'$  PO q4H PRN -Care RN asked to support pt in pericare given skin breakdown -Pt has filled out HCPOA paperwork with chaplain, will follow-up on code status tomorrow -Proceed with Home Hospice discharge plan, will try to meet and touch base with his girlfriend today. Dr. PWhitney Museagreed to be the attending on record.   Goals of Care and Additional Recommendations:  Limitations on Scope of Treatment: Full Scope Treatment  Code Status:    Code Status Orders        Start     Ordered   03/04/16 2237  Full code  Continuous     03/04/16 2236    Code Status History    Date Active Date Inactive Code Status Order ID Comments User Context   01/25/2016 11:56 PM 01/26/2016 12:39 PM Full Code [id number] GOswald Hillock MD Inpatient   08/27/2015  9:15 AM 08/31/2015  8:10 PM DNR [id number] MAviva Signs MD Inpatient   02/11/2015  1:22 PM 02/14/2015  4:09 PM Full Code [id number] MAviva Signs MD Inpatient    10/29/2014  2:36 PM 10/30/2014  3:29 AM Full Code [id number] [facility]ne Cleveland [facility]   Advance Directive Documentation   Flowsheet Row Most Recent Value  Type of Advance Directive  Healthcare Power of Attorney, Living will  Pre-existing out of facility DNR order (yellow form or pink MOST form)  No data  "MOST" Form in Place?  No data       Prognosis:   < 6 months; pt presented with Sepsis from a presacral abscess, for which he was treated with abx and a transgluteal drain. His related cellulitis and pyomyositis of the gluteus maximus and left hip and upper thigh remain. His underlying rectal cancer/renal cell carcinoma is also metastatic with liver and lung mets.There is no further treatment for his underlying malignancy, per his report. He is medically very fragile and remains at high risk of recurrent infection.   Discharge Planning:  Home with [place] discussed with MWadie LessenNP, Dr. PWhitney Muse CM, and Mr. SParas Amion message sent to Dr. DCharlies Silversto update.   Thank you for allowing the Palliative Medicine Team to assist in the care of this patient.   Time In: 0900 Time Out: 1000 Total Time 60 min Prolonged Time Billed  Yes      Greater than 50%  of this time was spent counseling and coordinating care related to the above assessment and plan.  SJannette Fogo NP Please contact Palliative Medicine Team  phone at [phone] for questions and concerns.

## 2016-03-15 NOTE — Care Management Note (Signed)
Case Management Note  Patient Details  Name: Andrew Kline MRN: [id number] Date of Birth: [birth year]  Subjective/Objective:     Significant other states she talked with pt's mother who will be arriving in [place] and will assist in his care, they have chosen [facility] and Hospice.  [facility] liaison will meet with pt and SO today.                              Expected Discharge Plan:  Home w Hospice Care  In-House Referral:  Hospice / Palliative Care  Discharge planning Services  CM Consult  Choice offered to:  Patient, Spouse  HH Arranged:  RN, Social Work [facility] Agency:  Other - See comment  Status of Service:  In process, will continue to follow  Girard Cooter, RN 03/15/2016, 12:19 PM

## 2016-03-16 DIAGNOSIS — A419 Sepsis, unspecified organism: Principal | ICD-10-CM

## 2016-03-16 DIAGNOSIS — N390 Urinary tract infection, site not specified: Secondary | ICD-10-CM

## 2016-03-16 DIAGNOSIS — C7800 Secondary malignant neoplasm of unspecified lung: Secondary | ICD-10-CM

## 2016-03-16 DIAGNOSIS — C20 Malignant neoplasm of rectum: Secondary | ICD-10-CM

## 2016-03-16 DIAGNOSIS — Z9359 Other cystostomy status: Secondary | ICD-10-CM

## 2016-03-16 LAB — BASIC METABOLIC PANEL
Anion gap: 9 (ref 5–15)
BUN: 8 mg/dL (ref 6–20)
CHLORIDE: 103 mmol/L (ref 101–111)
CO2: 29 mmol/L (ref 22–32)
CREATININE: 1.2 mg/dL (ref 0.61–1.24)
Calcium: 8.8 mg/dL — ABNORMAL LOW (ref 8.9–10.3)
GFR calc non Af Amer: >60 mL/min (ref >60)
Glucose, Bld: 105 mg/dL — ABNORMAL HIGH (ref 65–99)
POTASSIUM: 4 mmol/L (ref 3.5–5.1)
SODIUM: 141 mmol/L (ref 135–145)

## 2016-03-16 LAB — CBC
HCT: 37.2 % — ABNORMAL LOW (ref 39.0–52.0)
HEMOGLOBIN: 11.5 g/dL — AB (ref 13.0–17.0)
MCH: 27.8 pg (ref 26.0–34.0)
MCHC: 30.9 g/dL (ref 30.0–36.0)
MCV: 89.9 fL (ref 78.0–100.0)
Platelets: 455 10*3/uL — ABNORMAL HIGH (ref 150–400)
RBC: 4.14 MIL/uL — AB (ref 4.22–5.81)
RDW: 19.9 % — ABNORMAL HIGH (ref 11.5–15.5)
WBC: 8.5 10*3/uL (ref 4.0–10.5)

## 2016-03-16 MED ORDER — ACETAMINOPHEN 325 MG PO TABS: 650.0000 mg | ORAL_TABLET | Freq: Four times a day (QID) | ORAL | Status: DC | PRN

## 2016-03-16 MED ORDER — ONDANSETRON HCL 4 MG PO TABS: 4.0000 mg | ORAL_TABLET | Freq: Four times a day (QID) | ORAL | 0 refills | Status: DC | PRN

## 2016-03-16 MED ORDER — AMOXICILLIN-POT CLAVULANATE 875-125 MG PO TABS: 1.0000 | ORAL_TABLET | Freq: Two times a day (BID) | ORAL | 0 refills | Status: AC

## 2016-03-16 MED ORDER — HYDROMORPHONE HCL 2 MG PO TABS: 2.0000 mg | ORAL_TABLET | ORAL | 0 refills | Status: DC | PRN

## 2016-03-16 MED ORDER — DOXYCYCLINE HYCLATE 100 MG PO TABS: 100.0000 mg | ORAL_TABLET | Freq: Two times a day (BID) | ORAL | 0 refills | Status: AC

## 2016-03-16 NOTE — Progress Notes (Signed)
Contacted [facility] for patient transport; requested transport within the hour.

## 2016-03-16 NOTE — Discharge Summary (Signed)
Physician Discharge Summary  Andrew Kline IHK:[id number] DOB: 1966/01/18 DOA: 03/04/2016  PCP: [name], MD  Admit date: 03/04/2016 Discharge date: 03/16/2016  Admitted From: Home Disposition:  Home with Hospice  Recommendations for Outpatient Follow-up:  1. Follow up with PCP in 1-2 weeks 2. Please obtain BMP/CBC in one week 3. Please follow up on the following pending results:  Home Health:Hospice Services Equipment/Devices: Wound Care per hospice  Discharge Condition: Hospice CODE STATUS: Full Code but Comfort based- will discuss with palliative care again today Diet recommendation: Regular diet   Brief/Interim Summary: 50 year old male with past medical history of metastatic rectal cancer status post colostomy, renal cell carcinoma of the right kidney, status post partial nephrectomy with bilateral nephrostomy tubes and prior presacral abscesses requiring admission 9/25-10/1 who presented with recurrent infection.  Patient was seen at [facility] by general surgery who felt no surgical intervention was needed at that time and that patient will require percutaneous drainage via interventional radiology. Patient underwent CT-guided left transgluteal drain placement with fecal fluid aspirated and culture sent due to concern for fistula to bladder or bowel. General surgery and ID have seen the pt in consultation.  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 Patient has a very complicated GU system with bilateral nephrostomy tubes, suprapubic catheter, colostomy secondary to his metastatic cancer. Patient also noted to have cellulitis/pyomyositis of the left buttocks and upper thigh per CT scan. Antibiotic coverage has been extended to include vancomycin in addition to him being already on zosyn. Abx eventually switched to PO Augmentin and doxycycline 11/13. Palliative care consulted for goals of care and pain management.   Decision made to discharge patient home with hospice services and family is requesting continuation and full course of antibiotic treatment at discharge.  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 [facility])   Complicated UTI (urinary tract infection)   Sepsis (HCC)   Renal cell carcinoma (HCC)   Hyponatremia   Pyomyositis: Left gluteus maximus per CT 03/07/2016   Sepsis affecting skin (HCC)   Suprapubic catheter ([place])   Uncontrolled pain   Palliative care encounter   DNR (do not resuscitate) discussion   Goals of care, counseling/discussion  Sepsis secondary to presacral abscess, status post transgluteal drain placement / Cellulitis and pyomyositis of gluteusmaximus and left hip and upper thigh - Sepsis criteria met on the admission with leukocytosis, fever, tachycardia, elevated lactic acid of 3.4 and hypotension - CT abdomen and pelvis which was abnormal did show a increasing size of presacral collection now at 6.2 x 5.9 x 9.2 cm newly containing gas. - Source of sepsis is presacral abscess for which patient underwent a transgluteal drain placement 03/05/2016. This was done by interventional radiology- hospice to perform wound care at home as needed for comfort - Per ID, abx switched to Augmentin and doxy which pt will continue for 1 month (started 11/13 through 12/13)  - The culture from the abscess was re-incubated for better growth  - Blood cultures negative to date  - Continue pain management efforts; patient transitioned to PO diluadid yesterday with good pain control  Hyponatremia - Likely secondary to hypovolemic hyponatremia - Sodium level normalized with hydration   History of rectal cancer/renal cell carcinoma status post bilateralnephrostomy tubes - Patient with history of metastatic rectal cancer status post colostomy, renal cell carcinoma of the right kidney status post  partial  nephrectomy with bilateral nephrostomy tubes - CT abdomen and pelvis with pulmonary metastases and possible liver metastases - Outpt follow up with oncology   Anemia of chronic disease - Secondary to history of malignancy - Hemoglobin stable  Depression - Continue Cymbalta and amitriptyline - Continue Lyrica - Mother reports waxing and waning mentation - will continue above medications at discharge  Yeast UTI - Urine cultures with 70,000 colonies of yeast.  Discharge Instructions     Medication List    STOP taking these medications   zolpidem 12.5 MG CR tablet Commonly known as:  AMBIEN CR     TAKE these medications   acetaminophen 325 MG tablet Commonly known as:  TYLENOL Take 2 tablets (650 mg total) by mouth every 6 (six) hours as needed for mild pain (or Fever >/= 101).   albuterol 108 (90 Base) MCG/ACT inhaler Commonly known as:  PROVENTIL HFA;VENTOLIN HFA Inhale 2 puffs into the lungs every 6 (six) hours as needed for wheezing or shortness of breath.   amitriptyline 50 MG tablet Commonly known as:  ELAVIL Take 1 tablet (50 mg total) by mouth at bedtime.   amoxicillin-clavulanate 875-125 MG tablet Commonly known as:  AUGMENTIN Take 1 tablet by mouth every 12 (twelve) hours.   docusate sodium 100 MG capsule Commonly known as:  COLACE Take 1 capsule (100 mg total) by mouth every 12 (twelve) hours. What changed:  when to take this  reasons to take this   doxycycline 100 MG tablet Commonly known as:  VIBRA-TABS Take 1 tablet (100 mg total) by mouth every 12 (twelve) hours.   DULoxetine 60 MG capsule Commonly known as:  CYMBALTA Take 1 capsule (60 mg total) by mouth daily.   fentaNYL 75 MCG/HR Commonly known as:  DURAGESIC - dosed mcg/hr Place 2 patches (150 mcg total) onto the skin every 3 (three) days.   HYDROmorphone 2 MG tablet Commonly known as:  DILAUDID Take 1 tablet (2 mg total) by mouth every 4 (four) hours as needed for severe  pain. What changed:  medication strength  how much to take   ondansetron 4 MG tablet Commonly known as:  ZOFRAN Take 1 tablet (4 mg total) by mouth every 6 (six) hours as needed for nausea.   pregabalin 75 MG capsule Commonly known as:  LYRICA Take 2 capsules (150 mg total) by mouth 2 (two) times daily.       Allergies  Allergen Reactions  . Oxaliplatin Itching    Consultations:  Infectious Disease  Surgery  Palliative Care   Procedures/Studies: Ct Hip Left W Contrast  Result Date: 03/07/2016 CLINICAL DATA:  Patient with history renal cell and rectal carcinoma. Left hip pain and swelling. EXAM: CT OF THE LEFT HIP WITH CONTRAST TECHNIQUE: Multidetector CT imaging was performed following the standard protocol during bolus administration of intravenous contrast. CONTRAST:  100 ml ISOVUE-300 IOPAMIDOL (ISOVUE-300) INJECTION 61% COMPARISON:  CT abdomen and pelvis 03/04/2016. FINDINGS: Presacral abscess with a drainage catheter in place is partially imaged. Stranding in subcutaneous fatty tissues about the left buttock and hip has worsened since the prior CT scan. The left gluteus maximus muscle appears edematous. The muscles only partially imaged but no focal fluid collection is seen within the muscle. There are a few tiny locules of gas within the muscle. The left hip is located. No fracture or focal bone lesion is identified. No hip joint effusion is seen. Limited visualization of the symphysis pubis and left SI joint are unremarkable. IMPRESSION:  Increased stranding in subcutaneous fat about the left buttock and hip consistent with worsened cellulitis. The left gluteal musculature is incompletely imaged but there are a few tiny locules of gas within the gluteus maximus compatible with pyomyositis. No intramuscular abscess is visualized on this examination. Presacral abscess with a drainage catheter in place. Electronically Signed   By: Inge Rise M.D.   On: 03/07/2016 20:26    Ct Abdomen Pelvis W Contrast  Result Date: 03/04/2016 CLINICAL DATA:  Fever, low back pain, LEFT foot pain, shortness of breath, tachycardia, generalized abdominal pain, history renal cell carcinoma, rectal cancer, kidney stones, former smoker EXAM: CT ABDOMEN AND PELVIS WITH CONTRAST TECHNIQUE: Multidetector CT imaging of the abdomen and pelvis was performed using the standard protocol following bolus administration of intravenous contrast. Sagittal and coronal MPR images reconstructed from axial data set. CONTRAST:  160m ISOVUE-300 IOPAMIDOL (ISOVUE-300) INJECTION 61% COMPARISON:  01/26/2016, 01/12/2016 FINDINGS: Lower chest: Bibasilar pulmonary metastases again identified. 2.1 x 1.7 cm diameter RIGHT lower lobe metastasis image 7 has cavitated centrally since previous exam. LEFT lung base metastasis 17 x 13 mm image 15 appeared larger on the previous exam at approximately 17 x 18 mm. Other nodules appear grossly stable Hepatobiliary: Mild diffuse fatty infiltration of liver. Hypervascular focus lateral segment LEFT lobe liver 11 mm diameter image 19 Pancreas: Chronic calcific pancreatitis changes. No definite pancreatic mass or fluid collection. Spleen: Normal appearance Adrenals/Urinary Tract: Adrenal glands normal appearance. BILATERAL nephrostomy tubes without renal mass or hydronephrosis. Normal caliber ureters and bladder. Suprapubic catheter in bladder. Mild diffuse bladder wall thickening which could reflect chronic infection, chronic outlet obstruction, or prior radiation therapy. Stomach/Bowel: Normal appendix. LEFT lower quadrant colostomy with parastomal herniation of nonobstructed small bowel loops. Scattered stool throughout colon. Post perineal proctectomy per chart. Large gas and fluid collection and presacral space, with thickened wall overall measuring 6.2 x 5.9 x 9.2 cm. Collection appears larger and the presence of air within the collection is new since the previous exam.  Vascular/Lymphatic: Atherosclerotic calcifications aorta without aneurysm. Reproductive: N/A Other: No free air or free fluid. Musculoskeletal: Infiltrative changes and enlargement of the LEFT gluteus maximus, containing probable fluid and multiple foci of gas consistent with infection, potentially by gas-forming organism versus extension of the presacral space process. No acute osseous findings. IMPRESSION: Increase in size of a presacral collection now 6.2 x 5.9 x 9.2 cm, newly containing gas, question abscess. New enlargement and gas within the LEFT gluteus maximus likely representing infection, question by a gas-forming organism. Interval resolution of BILATERAL hydronephrosis post nephrostomy placement. 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 Pulmonary metastases, with a LEFT lower lobe lesion appearing slightly smaller and a RIGHT lower lead lesion now cavitated. Bladder wall thickening question due to chronic infection, chronic outlet obstruction, or prior pelvic irradiation. Chronic calcific pancreatitis. 11 mm hypervascular focus lateral segment LEFT lobe liver cannot exclude hypervascular metastasis. Findings called to Dr. ZRoderic Palauon 03/04/2016 at 1800 hours. Electronically Signed   By: MLavonia DanaM.D.   On: 03/04/2016 18:01   Dg Chest Port 1 View  Result Date: 03/04/2016 CLINICAL DATA:  Several days of fever, tachycardia, back pain, and shortness of breath. Patient recently hospitalized for severe kidney infection. EXAM: PORTABLE CHEST 1 VIEW COMPARISON:  Portable chest x-ray of January 25, 2016 FINDINGS: The lungs are adequately inflated. The interstitial markings are prominent especially on the right. The heart is mildly enlarged. The central pulmonary vascularity  is prominent. The mediastinum is normal in width. There is no pleural effusion. The power port catheter tip projects over the midportion of the SVC. The bony  thorax is unremarkable. IMPRESSION: Mild interstitial prominence greatest on the left not as conspicuous as on the previous study. This likely reflects low-grade pulmonary edema. There is no alveolar pneumonia nor pleural effusion. When the patient can tolerate the procedure, a PA and lateral chest x-ray would be useful. Electronically Signed   By: David  Martinique M.D.   On: 03/04/2016 16:40   Ir Nephrostomy Exchange Left  Result Date: 03/08/2016 INDICATION: History rectal cancer, urinary bladder injury, persistent urine leak. Nephrostomy tubes for urinary diversion. EXAM: IR EXCHANGE NEPHROSTOMY LEFT; IR EXCHANGE NEPHROSTOMY RIGHT COMPARISON:  01/27/2016 MEDICATIONS: Patient is already receiving IV antibiotics as an inpatient ANESTHESIA/SEDATION: Fentanyl 150 mcg IV Moderate Sedation Time:  None. The patient was continuously monitored during the procedure by the interventional radiology nurse under my direct supervision. CONTRAST:  10 cc - administered into the collecting system(s) FLUOROSCOPY TIME:  Fluoroscopy Time: 1 minutes 54 seconds (9 mGy). COMPLICATIONS: None immediate. PROCEDURE: Informed written consent was obtained from the patient after a thorough discussion of the procedural risks, benefits and alternatives. All questions were addressed. Maximal Sterile Barrier Technique was utilized including caps, mask, sterile gowns, sterile gloves, sterile drape, hand hygiene and skin antiseptic. A timeout was performed prior to the initiation of the procedure. Under sterile conditions and local anesthesia, the existing bilateral nephrostomy catheters were cut and removed over Amplatz guidewires. New 10 French nephrostomies were advanced into the collecting systems bilaterally. Retention loops formed in the renal pelvis. Contrast injection confirms position. Catheter secured with Prolene sutures and connected to external gravity drainage bags. Sterile dressing applied. No immediate complication. Patient tolerated  the procedure well. IMPRESSION: Successful fluoroscopic bilateral nephrostomy exchanges (10 [place]) Electronically Signed   By: Jerilynn Mages.  Shick M.D.   On: 03/08/2016 15:36   Ir Nephrostomy Exchange Right  Result Date: 03/08/2016 INDICATION: History rectal cancer, urinary bladder injury, persistent urine leak. Nephrostomy tubes for urinary diversion. EXAM: IR EXCHANGE NEPHROSTOMY LEFT; IR EXCHANGE NEPHROSTOMY RIGHT COMPARISON:  01/27/2016 MEDICATIONS: Patient is already receiving IV antibiotics as an inpatient ANESTHESIA/SEDATION: Fentanyl 150 mcg IV Moderate Sedation Time:  None. The patient was continuously monitored during the procedure by the interventional radiology nurse under my direct supervision. CONTRAST:  10 cc - administered into the collecting system(s) FLUOROSCOPY TIME:  Fluoroscopy Time: 1 minutes 54 seconds (9 mGy). COMPLICATIONS: None immediate. PROCEDURE: Informed written consent was obtained from the patient after a thorough discussion of the procedural risks, benefits and alternatives. All questions were addressed. Maximal Sterile Barrier Technique was utilized including caps, mask, sterile gowns, sterile gloves, sterile drape, hand hygiene and skin antiseptic. A timeout was performed prior to the initiation of the procedure. Under sterile conditions and local anesthesia, the existing bilateral nephrostomy catheters were cut and removed over Amplatz guidewires. New 10 French nephrostomies were advanced into the collecting systems bilaterally. Retention loops formed in the renal pelvis. Contrast injection confirms position. Catheter secured with Prolene sutures and connected to external gravity drainage bags. Sterile dressing applied. No immediate complication. Patient tolerated the procedure well. IMPRESSION: Successful fluoroscopic bilateral nephrostomy exchanges (10 [place]) Electronically Signed   By: Jerilynn Mages.  Shick M.D.   On: 03/08/2016 15:36   Ct Image Guided Drainage By Percutaneous  Catheter  Result Date: 03/05/2016 INDICATION: Fever, elevated white count, recurrent presacral pelvic abscess. 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 EXAM: CT GUIDED  DRAINAGE OF RECURRENT PRESACRAL PELVIC ABSCESS MEDICATIONS: The patient is currently admitted to the hospital and receiving intravenous antibiotics. The antibiotics were administered within an appropriate time frame prior to the initiation of the procedure. ANESTHESIA/SEDATION: 2.0 mg IV Versed 200 mcg IV Fentanyl Moderate Sedation Time:  14 MINUTES The patient was continuously monitored during the procedure by the interventional radiology nurse under my direct supervision. COMPLICATIONS: None immediate. TECHNIQUE: Informed written consent was obtained from the patient after a thorough discussion of the procedural risks, benefits and alternatives. All questions were addressed. Maximal Sterile Barrier Technique was utilized including caps, mask, sterile gowns, sterile gloves, sterile drape, hand hygiene and skin antiseptic. A timeout was performed prior to the initiation of the procedure. PROCEDURE: Previous imaging reviewed. Patient positioned prone. Noncontrast localization CT performed. The presacral pelvic abscess was localized. Abscess cavity now containing dilute contrast, suspect from fistula the bladder or bowel The left gluteal region was prepped with ChloraPrep in a sterile fashion, and a sterile drape was applied covering the operative field. A sterile gown and sterile gloves were used for the procedure. Local anesthesia was provided with 1% Lidocaine. Under sterile conditions and local anesthesia, an 18 gauge 10 cm access needle was advanced from a left trans gluteal approach into the fluid collection. Needle position confirmed. Syringe aspiration yielded fecal contaminated fluid. Guidewire inserted followed by tract dilatation to advance a 10 [place] drain. Drain catheter position confirmed with CT. Syringe aspiration yielded 50 cc purulent  fecal contaminated fluid. Catheter secured with a Prolene suture and connected to external gravity drainage bag. Sterile dressing applied. No immediate complication. Patient tolerated the procedure well. FINDINGS: CT imaging confirms needle access of the presacral abscess for drain insertion. IMPRESSION: Successful CT-guided left trans gluteal presacral abscess drain insertion. Dilute contrast within the abscess cavity from the recent CT, suspicious for fistula from either the bowel or bladder. Electronically Signed   By: Jerilynn Mages.  Shick M.D.   On: 03/05/2016 13:06     Subjective: Patient is awake and alert- does not talk much during interview.  Breakfast tray in front of him but states he is not hungry.  Agrees that pain is well controlled.  Surrounded by family- mother, stepfather, father, brother and sister.  Plan is to discharge home with hospice services today.  Mother voices that patient has become less lucid.  Mother is healthcare proxy and voices that patient would like to have his body donated to a medical school.   Discharge Exam: Vitals:   03/16/16 0320 03/16/16 0727  BP: 137/83 113/64  Pulse:  93  Resp:    Temp: 98 F (36.7 C) 98.1 F (36.7 C)   Vitals:   03/15/16 2355 03/16/16 0100 03/16/16 0320 03/16/16 0727  BP: (!) 144/84  137/83 113/64  Pulse: 92   93  Resp: 18     Temp: 98.5 F (36.9 C)  98 F (36.7 C) 98.1 F (36.7 C)  TempSrc: Oral  Oral Oral  SpO2: 92% 90% 92% 99%  Weight:      Height:        General: Pt is alert, awake, not in acute distress Cardiovascular: RRR, S1/S2 +, no rubs, no gallops Respiratory: Wheezing and rhonchi throughout lung fields Abdominal: Soft, NT, ND, bowel sounds + Extremities: 1+ edema to mid shin, no cyanosis    The results of significant diagnostics from this hospitalization (including imaging, microbiology, ancillary and laboratory) are listed below for reference.     Microbiology: No results found for this  or any previous visit  (from the past 240 hour(s)).   Labs: BNP (last 3 results) No results for input(s): BNP in the last 8760 hours. Basic Metabolic Panel:  Recent Labs Lab 03/10/16 0500 03/11/16 0430 03/12/16 0500 03/15/16 0903 03/16/16 0646  NA 140  --  141 140 141  K 3.7  --  3.7 3.9 4.0  CL 104  --  102 102 103  CO2 31  --  31 32 29  GLUCOSE 96  --  110* 125* 105*  BUN 6  --  _0 CREATININE 0.90 1.08 1.05 1.29* 1.20  CALCIUM 7.9*  --  8.2* 8.7* 8.8*   Liver Function Tests: No results for input(s): AST, ALT, ALKPHOS, BILITOT, PROT, ALBUMIN in the last 168 hours. No results for input(s): LIPASE, AMYLASE in the last 168 hours. No results for input(s): AMMONIA in the last 168 hours. CBC:  Recent Labs Lab 03/10/16 0500 03/11/16 0430 03/12/16 0500 03/13/16 0625 03/16/16 0646  WBC 5.8 6.1 6.6 7.6 8.5  HGB 10.1* 10.6* 10.2* 10.9* 11.5*  HCT 31.9* 34.0* 33.5* 35.9* 37.2*  MCV 86.4 88.8 88.4 90.0 89.9  PLT 420* 447* 459* 458* 455*   Cardiac Enzymes: No results for input(s): CKTOTAL, CKMB, CKMBINDEX, TROPONINI in the last 168 hours. BNP: Invalid input(s): POCBNP CBG:  Recent Labs Lab 03/09/16 1248  GLUCAP 166*   D-Dimer No results for input(s): DDIMER in the last 72 hours. Hgb A1c No results for input(s): HGBA1C in the last 72 hours. Lipid Profile No results for input(s): CHOL, HDL, LDLCALC, TRIG, CHOLHDL, LDLDIRECT in the last 72 hours. Thyroid function studies No results for input(s): TSH, T4TOTAL, T3FREE, THYROIDAB in the last 72 hours.  Invalid input(s): FREET3 Anemia work up No results for input(s): VITAMINB12, FOLATE, FERRITIN, TIBC, IRON, RETICCTPCT in the last 72 hours. Urinalysis    Component Value Date/Time   COLORURINE YELLOW 03/04/2016 1637   APPEARANCEUR CLEAR 03/04/2016 1637   LABSPEC 1.015 03/04/2016 1637   PHURINE 6.0 03/04/2016 1637   GLUCOSEU NEGATIVE 03/04/2016 1637   HGBUR LARGE (A) 03/04/2016 1637   BILIRUBINUR NEGATIVE 03/04/2016 1637   KETONESUR  NEGATIVE 03/04/2016 1637   PROTEINUR 100 (A) 03/04/2016 1637   UROBILINOGEN 0.2 03/17/2015 0910   NITRITE NEGATIVE 03/04/2016 1637   LEUKOCYTESUR LARGE (A) 03/04/2016 1637   Sepsis Labs Invalid input(s): PROCALCITONIN,  WBC,  LACTICIDVEN Microbiology No results found for this or any previous visit (from the past 240 hour(s)).   Time coordinating discharge: Over 30 minutes  SIGNED:   Loretha Stapler, MD  [facility] 03/16/2016, 9:58 AM Pager [phone] If 7PM-7AM, please contact night-coverage [email or web address] Password [email or web address]

## 2016-03-16 NOTE — Progress Notes (Signed)
Daily Progress Note   Patient Name: Andrew Kline       Date: 03/16/2016 DOB: 14-Aug-1965  Age: 50 y.o. MRN#: [id number] Attending Physician: Eber Jones, MD Primary Care Physician: [name], MD Admit Date: 03/04/2016  Reason for Consultation/Follow-up: Disposition, Establishing goals of care, Non pain symptom management, Pain control and Psychosocial/spiritual support  Subjective: I visited Mr. Polhamus in conjunction with Wadie Lessen, NP . His mother was at the bedside. He endorsed well managed pain, and comfort with the plan for discharge home with Hospice support today. Neither he nor his mother had any questions or concerns.   Length of Stay: 12  Current Medications: Scheduled Meds:  . alteplase  2 mg Intracatheter Once  . amitriptyline  50 mg Oral QHS  . amoxicillin-clavulanate  1 tablet Oral Q12H  . doxycycline  100 mg Oral Q12H  . DULoxetine  60 mg Oral Daily  . enoxaparin (LOVENOX) injection  40 mg Subcutaneous Q24H  . fentaNYL  150 mcg Transdermal Q72H  . pregabalin  150 mg Oral BID  . senna-docusate  1 tablet Oral BID  . sodium chloride flush  3 mL Intravenous Q12H    Continuous Infusions:   PRN Meds: acetaminophen **OR** acetaminophen, albuterol, HYDROmorphone, ondansetron **OR** ondansetron (ZOFRAN) IV, sodium chloride flush, zolpidem  Physical Exam  Constitutional: He is oriented to person, place, and time. He appears well-developed and well-nourished. He has a sickly appearance.  HENT:  Head: Normocephalic and atraumatic.  Neck: Normal range of motion.  Pulmonary/Chest: Effort normal.  Abdominal:  Suprapubic cath, colostomy  Genitourinary:  Genitourinary Comments: bilat nephrostomy tubes.   Musculoskeletal: Normal range of motion.    Neurological: He is alert and oriented to person, place, and time.  Skin: Skin is warm and dry.  Erythema and skin irritation on bilateral inner thighs/groin. Left leg edematous.   Psychiatric: Thought content normal. His speech is delayed. He is slowed and withdrawn. Cognition and memory are normal. He expresses impulsivity.  Flat affect.  Nursing note and vitals reviewed.           Vital Signs: BP 113/64 (BP Location: Right Arm)   Pulse 93   Temp 98.1 F (36.7 C) (Oral)   Resp 18   Ht _0  (1.651 m)   Wt 98.9 kg (218 lb) Comment: Weight taken  by RN  SpO2 99%   BMI 36.28 kg/m  SpO2: SpO2: 99 % O2 Device: O2 Device: Nasal Cannula O2 Flow Rate: O2 Flow Rate (L/min): 3 L/min  Intake/output summary:   Intake/Output Summary (Last 24 hours) at 03/16/16 1528 Last data filed at 03/16/16 1452  Gross per 24 hour  Intake              483 ml  Output             3325 ml  Net            -2842 ml   LBM: Last BM Date: 03/15/16 Baseline Weight: Weight: 81.6 kg (180 lb) Most recent weight: Weight: 98.9 kg (218 lb) (Weight taken by RN)       Palliative Assessment/Data:  PPS 50%    Patient Active Problem List   Diagnosis Date Noted  . Goals of care, counseling/discussion   . DNR (do not resuscitate) discussion   . Pyomyositis: Left gluteus maximus per CT 03/07/2016 03/08/2016  . Carcinoma of kidney ([place])   . Sepsis affecting skin ([place])   . Suprapubic catheter ([place])   . Uncontrolled pain   . Palliative care encounter   . Pelvic abscess in male [facility])   . Sepsis ([place]) 03/04/2016  . Renal cell carcinoma ([place]) 03/04/2016  . Hyponatremia 03/04/2016  . Complicated UTI (urinary tract infection) 01/25/2016  . Physical deconditioning 07/07/2015  . Muscular deconditioning 07/07/2015  . Malnutrition of moderate degree ([place]) 02/13/2015  . 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   Patient was seen at [facility] by general surgery who felt no surgical intervention was needed at that time and that patient will require percutaneous drainage via interventional radiology.  Patient underwent CT-guided left transgluteal drain placement with fecal fluid aspirated and culture sent due to concern for fistula to bladder or bowel. General surgery and ID have seen the pt in consultation.   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 Patient has a very complicated GU system with bilateral nephrostomy tubes, suprapubic catheter, colostomy secondary to his metastatic cancer. Patient also noted to have cellulitis/pyomyositis of the left buttocks and upper thigh per CT scan. He was on IV abx, now transitioned to PO Augmentin and Doxycycline. Palliative care was consulted for goals of care and pain management.  Assessment: Mr. Thielman is to be discharged home with Hospice support today. I adjusted his pain medication yesterday to an oral regimen, and he felt his pain remained effectively managed. He has no acute complaints. His mother, at the bedside, arrived from [place] to provide full time care for him at home, and she had no questions nor concerns when we met. She verbalized a clear understanding of the discharge plan, and had already spoken with the Hospice group to help coordinate care.  Recommendations/Plan: -Home with Hospice support. Mr. Payne has good home support with his mother and girlfriend's presence.  -On discharge, would continue his current in hospital medications; Hospice will adjust his pain medication as needed.   Goals of Care and Additional  Recommendations:  Limitations on Scope of Treatment: Full Scope  Treatment  Code Status:    Code Status Orders        Start     Ordered   03/04/16 2237  Full code  Continuous     03/04/16 2236    Code Status History    Date Active Date Inactive Code Status Order ID Comments User Context   01/25/2016 11:56 PM 01/26/2016 12:39 PM Full Code [id number]  Oswald Hillock, MD Inpatient   08/27/2015  9:15 AM 08/31/2015  8:10 PM DNR [id number]  Aviva Signs, MD Inpatient   02/11/2015  1:22 PM 02/14/2015  4:09 PM Full Code [id number]  Aviva Signs, MD Inpatient   10/29/2014  2:36 PM 10/30/2014  3:29 AM Full Code [id number]  Arne Cleveland, MD [facility]    Advance Directive Documentation   Flowsheet Row Most Recent Value  Type of Advance Directive  Healthcare Power of Attorney, Living will  Pre-existing out of facility DNR order (yellow form or pink MOST form)  No data  "MOST" Form in Place?  No data       Prognosis:   < 6 months; pt presented with Sepsis from a presacral abscess, for which he was treated with abx and a transgluteal drain. His related cellulitis and pyomyositis of the gluteus maximus and left hip and upper thigh remain. His underlying rectal cancer/renal cell carcinoma is also metastatic with liver and lung mets.There is no further treatment for his underlying malignancy, per his report. He is medically very fragile and remains at high risk of recurrent infection.   Discharge Planning:  Home with Hospice  Care plan was discussed with Wadie Lessen Np, Mr. Kluever, and pt's mother.  Thank you for allowing the Palliative Medicine Team to assist in the care of this patient.   Time In: 1315 Time Out: 1330 Total Time 15 min Prolonged Time Billed  Yes      Greater than 50%  of this time was spent counseling and coordinating care related to the above assessment and plan.  Jannette Fogo, NP Please contact Palliative Medicine Team phone at [phone] for questions and concerns.

## 2016-03-16 NOTE — Progress Notes (Signed)
Followed-up with nurse who'd asked spiritual care staff  Monday could pt be married in hospital while they were there to help pt complete advanced directive. The director of spiritual care was consulted after nurse asked this Q for patient of chaplain and spiritual care notary.But, with pt's being soon released to hospice, the decision was that he could better obtain a marriage license and ask another chaplain to perform his marriage after his release from hospital.   03/16/16 1400  Clinical Encounter Type  Visited With Health care provider  Visit Type Follow-up  Referral From Nurse

## 2016-03-21 ENCOUNTER — Emergency Department (HOSPITAL_COMMUNITY)
Admission: EM | Admit: 2016-03-21 | Discharge: 2016-03-21 | Disposition: A | Discharge status: Home / Self Care | Payer: Medicaid Other | Attending: Dermatology | Admitting: Dermatology

## 2016-03-21 ENCOUNTER — Encounter (HOSPITAL_COMMUNITY): Payer: Self-pay | Admitting: Emergency Medicine

## 2016-03-21 DIAGNOSIS — F17210 Nicotine dependence, cigarettes, uncomplicated: Secondary | ICD-10-CM | POA: Diagnosis not present

## 2016-03-21 DIAGNOSIS — T83092A Other mechanical complication of nephrostomy catheter, initial encounter: Secondary | ICD-10-CM | POA: Insufficient documentation

## 2016-03-21 DIAGNOSIS — Y829 Unspecified medical devices associated with adverse incidents: Secondary | ICD-10-CM | POA: Insufficient documentation

## 2016-03-21 DIAGNOSIS — Z85048 Personal history of other malignant neoplasm of rectum, rectosigmoid junction, and anus: Secondary | ICD-10-CM | POA: Insufficient documentation

## 2016-03-21 DIAGNOSIS — Z85528 Personal history of other malignant neoplasm of kidney: Secondary | ICD-10-CM | POA: Insufficient documentation

## 2016-03-21 DIAGNOSIS — Z5321 Procedure and treatment not carried out due to patient leaving prior to being seen by health care provider: Secondary | ICD-10-CM | POA: Diagnosis not present

## 2016-03-21 HISTORY — DX: Encounter for palliative care: Z51.5

## 2016-03-21 NOTE — ED Triage Notes (Signed)
Right nephrectomy tube pulled out yesterday

## 2016-03-21 NOTE — ED Notes (Signed)
Notified by registration that patient left.

## 2016-03-22 ENCOUNTER — Encounter (HOSPITAL_COMMUNITY): Payer: Self-pay | Admitting: Internal Medicine

## 2016-03-27 ENCOUNTER — Encounter (HOSPITAL_COMMUNITY): Payer: Self-pay | Admitting: Emergency Medicine

## 2016-03-27 ENCOUNTER — Inpatient Hospital Stay (HOSPITAL_COMMUNITY)
Admission: EM | Admit: 2016-03-27 | Discharge: 2016-03-29 | DRG: 699 | Disposition: A | Discharge status: Home / Self Care | Payer: Medicaid Other | Attending: Internal Medicine | Admitting: Internal Medicine

## 2016-03-27 DIAGNOSIS — Z801 Family history of malignant neoplasm of trachea, bronchus and lung: Secondary | ICD-10-CM

## 2016-03-27 DIAGNOSIS — Y738 Miscellaneous gastroenterology and urology devices associated with adverse incidents, not elsewhere classified: Secondary | ICD-10-CM | POA: Diagnosis present

## 2016-03-27 DIAGNOSIS — N179 Acute kidney failure, unspecified: Secondary | ICD-10-CM | POA: Diagnosis present

## 2016-03-27 DIAGNOSIS — M1990 Unspecified osteoarthritis, unspecified site: Secondary | ICD-10-CM | POA: Diagnosis present

## 2016-03-27 DIAGNOSIS — Z888 Allergy status to other drugs, medicaments and biological substances status: Secondary | ICD-10-CM

## 2016-03-27 DIAGNOSIS — C7800 Secondary malignant neoplasm of unspecified lung: Secondary | ICD-10-CM | POA: Diagnosis present

## 2016-03-27 DIAGNOSIS — C20 Malignant neoplasm of rectum: Secondary | ICD-10-CM | POA: Diagnosis present

## 2016-03-27 DIAGNOSIS — T83010A Breakdown (mechanical) of cystostomy catheter, initial encounter: Secondary | ICD-10-CM | POA: Diagnosis present

## 2016-03-27 DIAGNOSIS — M60009 Infective myositis, unspecified site: Secondary | ICD-10-CM | POA: Diagnosis present

## 2016-03-27 DIAGNOSIS — Z85528 Personal history of other malignant neoplasm of kidney: Secondary | ICD-10-CM

## 2016-03-27 DIAGNOSIS — R109 Unspecified abdominal pain: Secondary | ICD-10-CM

## 2016-03-27 DIAGNOSIS — Z905 Acquired absence of kidney: Secondary | ICD-10-CM

## 2016-03-27 DIAGNOSIS — Z79899 Other long term (current) drug therapy: Secondary | ICD-10-CM

## 2016-03-27 DIAGNOSIS — Z800 Family history of malignant neoplasm of digestive organs: Secondary | ICD-10-CM

## 2016-03-27 DIAGNOSIS — Z66 Do not resuscitate: Secondary | ICD-10-CM | POA: Diagnosis present

## 2016-03-27 DIAGNOSIS — F17210 Nicotine dependence, cigarettes, uncomplicated: Secondary | ICD-10-CM | POA: Diagnosis present

## 2016-03-27 DIAGNOSIS — R339 Retention of urine, unspecified: Secondary | ICD-10-CM | POA: Diagnosis present

## 2016-03-27 DIAGNOSIS — Z9889 Other specified postprocedural states: Secondary | ICD-10-CM

## 2016-03-27 DIAGNOSIS — T83090A Other mechanical complication of cystostomy catheter, initial encounter: Secondary | ICD-10-CM | POA: Diagnosis present

## 2016-03-27 DIAGNOSIS — R52 Pain, unspecified: Secondary | ICD-10-CM

## 2016-03-27 DIAGNOSIS — D649 Anemia, unspecified: Secondary | ICD-10-CM | POA: Diagnosis present

## 2016-03-27 DIAGNOSIS — C649 Malignant neoplasm of unspecified kidney, except renal pelvis: Secondary | ICD-10-CM | POA: Diagnosis present

## 2016-03-27 DIAGNOSIS — Z9359 Other cystostomy status: Secondary | ICD-10-CM

## 2016-03-27 DIAGNOSIS — Z87442 Personal history of urinary calculi: Secondary | ICD-10-CM

## 2016-03-27 DIAGNOSIS — T83030A Leakage of cystostomy catheter, initial encounter: Principal | ICD-10-CM | POA: Diagnosis present

## 2016-03-27 DIAGNOSIS — Z933 Colostomy status: Secondary | ICD-10-CM

## 2016-03-27 DIAGNOSIS — Z833 Family history of diabetes mellitus: Secondary | ICD-10-CM

## 2016-03-27 NOTE — ED Triage Notes (Signed)
C/o suprapubic catheter leaking since yesterday and R nephrectomy tube pulled out "several days" ago.

## 2016-03-28 ENCOUNTER — Encounter (HOSPITAL_COMMUNITY): Payer: Self-pay | Admitting: Internal Medicine

## 2016-03-28 ENCOUNTER — Observation Stay (HOSPITAL_COMMUNITY): Payer: Medicaid Other

## 2016-03-28 DIAGNOSIS — D649 Anemia, unspecified: Secondary | ICD-10-CM | POA: Diagnosis present

## 2016-03-28 DIAGNOSIS — Z85528 Personal history of other malignant neoplasm of kidney: Secondary | ICD-10-CM | POA: Diagnosis not present

## 2016-03-28 DIAGNOSIS — Z66 Do not resuscitate: Secondary | ICD-10-CM | POA: Diagnosis present

## 2016-03-28 DIAGNOSIS — Y738 Miscellaneous gastroenterology and urology devices associated with adverse incidents, not elsewhere classified: Secondary | ICD-10-CM | POA: Diagnosis present

## 2016-03-28 DIAGNOSIS — Z79899 Other long term (current) drug therapy: Secondary | ICD-10-CM | POA: Diagnosis not present

## 2016-03-28 DIAGNOSIS — C7800 Secondary malignant neoplasm of unspecified lung: Secondary | ICD-10-CM | POA: Diagnosis present

## 2016-03-28 DIAGNOSIS — M1990 Unspecified osteoarthritis, unspecified site: Secondary | ICD-10-CM | POA: Diagnosis present

## 2016-03-28 DIAGNOSIS — T83010A Breakdown (mechanical) of cystostomy catheter, initial encounter: Secondary | ICD-10-CM | POA: Diagnosis present

## 2016-03-28 DIAGNOSIS — Z800 Family history of malignant neoplasm of digestive organs: Secondary | ICD-10-CM | POA: Diagnosis not present

## 2016-03-28 DIAGNOSIS — Z9889 Other specified postprocedural states: Secondary | ICD-10-CM | POA: Diagnosis not present

## 2016-03-28 DIAGNOSIS — T83030A Leakage of cystostomy catheter, initial encounter: Secondary | ICD-10-CM | POA: Diagnosis present

## 2016-03-28 DIAGNOSIS — Z833 Family history of diabetes mellitus: Secondary | ICD-10-CM | POA: Diagnosis not present

## 2016-03-28 DIAGNOSIS — C20 Malignant neoplasm of rectum: Secondary | ICD-10-CM | POA: Diagnosis present

## 2016-03-28 DIAGNOSIS — Z87442 Personal history of urinary calculi: Secondary | ICD-10-CM | POA: Diagnosis not present

## 2016-03-28 DIAGNOSIS — Z905 Acquired absence of kidney: Secondary | ICD-10-CM | POA: Diagnosis not present

## 2016-03-28 DIAGNOSIS — Z933 Colostomy status: Secondary | ICD-10-CM | POA: Diagnosis not present

## 2016-03-28 DIAGNOSIS — T83090A Other mechanical complication of cystostomy catheter, initial encounter: Secondary | ICD-10-CM | POA: Diagnosis present

## 2016-03-28 DIAGNOSIS — F17210 Nicotine dependence, cigarettes, uncomplicated: Secondary | ICD-10-CM | POA: Diagnosis present

## 2016-03-28 DIAGNOSIS — N179 Acute kidney failure, unspecified: Secondary | ICD-10-CM | POA: Diagnosis present

## 2016-03-28 DIAGNOSIS — Z801 Family history of malignant neoplasm of trachea, bronchus and lung: Secondary | ICD-10-CM | POA: Diagnosis not present

## 2016-03-28 DIAGNOSIS — R339 Retention of urine, unspecified: Secondary | ICD-10-CM | POA: Diagnosis present

## 2016-03-28 DIAGNOSIS — C649 Malignant neoplasm of unspecified kidney, except renal pelvis: Secondary | ICD-10-CM | POA: Diagnosis present

## 2016-03-28 DIAGNOSIS — M60009 Infective myositis, unspecified site: Secondary | ICD-10-CM | POA: Diagnosis present

## 2016-03-28 DIAGNOSIS — Z888 Allergy status to other drugs, medicaments and biological substances status: Secondary | ICD-10-CM | POA: Diagnosis not present

## 2016-03-28 HISTORY — PX: IR GENERIC HISTORICAL: IMG1180011

## 2016-03-28 LAB — I-STAT CHEM 8, ED
BUN: 8 mg/dL (ref 6–20)
Calcium, Ion: 1.07 mmol/L — ABNORMAL LOW (ref 1.15–1.40)
Chloride: 101 mmol/L (ref 101–111)
Creatinine, Ser: 1.4 mg/dL — ABNORMAL HIGH (ref 0.61–1.24)
Glucose, Bld: 110 mg/dL — ABNORMAL HIGH (ref 65–99)
HEMATOCRIT: 38 % — AB (ref 39.0–52.0)
Hemoglobin: 12.9 g/dL — ABNORMAL LOW (ref 13.0–17.0)
Potassium: 3.7 mmol/L (ref 3.5–5.1)
SODIUM: 140 mmol/L (ref 135–145)
TCO2: 25 mmol/L (ref 0–100)

## 2016-03-28 LAB — BASIC METABOLIC PANEL
Anion gap: 9 (ref 5–15)
BUN: 7 mg/dL (ref 6–20)
CALCIUM: 8.5 mg/dL — AB (ref 8.9–10.3)
CHLORIDE: 102 mmol/L (ref 101–111)
CO2: 26 mmol/L (ref 22–32)
CREATININE: 1.28 mg/dL — AB (ref 0.61–1.24)
Glucose, Bld: 96 mg/dL (ref 65–99)
Potassium: 3.2 mmol/L — ABNORMAL LOW (ref 3.5–5.1)
SODIUM: 137 mmol/L (ref 135–145)

## 2016-03-28 LAB — URINE MICROSCOPIC-ADD ON

## 2016-03-28 LAB — URINALYSIS, ROUTINE W REFLEX MICROSCOPIC
Bilirubin Urine: NEGATIVE
Glucose, UA: NEGATIVE mg/dL
Ketones, ur: NEGATIVE mg/dL
Nitrite: NEGATIVE
PROTEIN: 100 mg/dL — AB
SPECIFIC GRAVITY, URINE: 1.012 (ref 1.005–1.030)
pH: 6 (ref 5.0–8.0)

## 2016-03-28 LAB — CBC WITH DIFFERENTIAL/PLATELET
BASOS ABS: 0 10*3/uL (ref 0.0–0.1)
BASOS ABS: 0 10*3/uL (ref 0.0–0.1)
BASOS PCT: 0 %
BASOS PCT: 0 %
EOS ABS: 0.7 10*3/uL (ref 0.0–0.7)
Eosinophils Absolute: 0.7 10*3/uL (ref 0.0–0.7)
Eosinophils Relative: 8 %
Eosinophils Relative: 9 %
HCT: 37.8 % — ABNORMAL LOW (ref 39.0–52.0)
HEMATOCRIT: 38.7 % — AB (ref 39.0–52.0)
HEMOGLOBIN: 12.6 g/dL — AB (ref 13.0–17.0)
HEMOGLOBIN: 12.3 g/dL — AB (ref 13.0–17.0)
Lymphocytes Relative: 14 %
Lymphocytes Relative: 15 %
Lymphs Abs: 1.2 10*3/uL (ref 0.7–4.0)
Lymphs Abs: 1.2 10*3/uL (ref 0.7–4.0)
MCH: 27.9 pg (ref 26.0–34.0)
MCH: 28 pg (ref 26.0–34.0)
MCHC: 32.6 g/dL (ref 30.0–36.0)
MCHC: 32.5 g/dL (ref 30.0–36.0)
MCV: 85.8 fL (ref 78.0–100.0)
MCV: 85.9 fL (ref 78.0–100.0)
MONOS PCT: 11 %
Monocytes Absolute: 1 10*3/uL (ref 0.1–1.0)
Monocytes Absolute: 0.9 10*3/uL (ref 0.1–1.0)
Monocytes Relative: 12 %
NEUTROS ABS: 6 10*3/uL (ref 1.7–7.7)
NEUTROS PCT: 67 %
NEUTROS PCT: 64 %
Neutro Abs: 5 10*3/uL (ref 1.7–7.7)
Platelets: 345 10*3/uL (ref 150–400)
Platelets: 328 10*3/uL (ref 150–400)
RBC: 4.51 MIL/uL (ref 4.22–5.81)
RBC: 4.4 MIL/uL (ref 4.22–5.81)
RDW: 19 % — ABNORMAL HIGH (ref 11.5–15.5)
RDW: 18.9 % — ABNORMAL HIGH (ref 11.5–15.5)
WBC: 9 10*3/uL (ref 4.0–10.5)
WBC: 7.8 10*3/uL (ref 4.0–10.5)

## 2016-03-28 MED ORDER — ACETAMINOPHEN 325 MG PO TABS: 650.0000 mg | ORAL_TABLET | Freq: Four times a day (QID) | ORAL | Status: DC | PRN

## 2016-03-28 MED ORDER — PREGABALIN 75 MG PO CAPS
150.0000 mg | ORAL_CAPSULE | Freq: Two times a day (BID) | ORAL | Status: DC
  Administered 2016-03-28 – 2016-03-29 (×3): 150 mg via ORAL
  Filled 2016-03-28 (×3): qty 2

## 2016-03-28 MED ORDER — FENTANYL CITRATE (PF) 100 MCG/2ML IJ SOLN
INTRAMUSCULAR | Status: AC
  Filled 2016-03-28: qty 2

## 2016-03-28 MED ORDER — ALBUTEROL SULFATE (2.5 MG/3ML) 0.083% IN NEBU: 2.5000 mg | INHALATION_SOLUTION | Freq: Four times a day (QID) | RESPIRATORY_TRACT | Status: DC | PRN

## 2016-03-28 MED ORDER — DOCUSATE SODIUM 100 MG PO CAPS: 100.0000 mg | ORAL_CAPSULE | Freq: Every day | ORAL | Status: DC | PRN

## 2016-03-28 MED ORDER — ZOLPIDEM TARTRATE 5 MG PO TABS
5.0000 mg | ORAL_TABLET | Freq: Every evening | ORAL | Status: DC | PRN
Start: 2016-03-28 — End: 2016-03-29

## 2016-03-28 MED ORDER — IOPAMIDOL (ISOVUE-300) INJECTION 61%
INTRAVENOUS | Status: AC
  Administered 2016-03-28: 35 mL
  Filled 2016-03-28: qty 50

## 2016-03-28 MED ORDER — ONDANSETRON HCL 4 MG PO TABS: 4.0000 mg | ORAL_TABLET | Freq: Four times a day (QID) | ORAL | Status: DC | PRN

## 2016-03-28 MED ORDER — ACETAMINOPHEN 650 MG RE SUPP: 650.0000 mg | Freq: Four times a day (QID) | RECTAL | Status: DC | PRN

## 2016-03-28 MED ORDER — DOXYCYCLINE HYCLATE 100 MG PO TABS
100.0000 mg | ORAL_TABLET | Freq: Two times a day (BID) | ORAL | Status: DC
Start: 2016-03-28 — End: 2016-03-29
  Administered 2016-03-28 – 2016-03-29 (×3): 100 mg via ORAL
  Filled 2016-03-28 (×3): qty 1

## 2016-03-28 MED ORDER — LIDOCAINE HCL 1 % IJ SOLN
INTRAMUSCULAR | Status: AC
  Filled 2016-03-28: qty 20

## 2016-03-28 MED ORDER — FLUCONAZOLE 100 MG PO TABS
100.0000 mg | ORAL_TABLET | Freq: Every day | ORAL | Status: DC
  Administered 2016-03-28 – 2016-03-29 (×2): 100 mg via ORAL
  Filled 2016-03-28 (×2): qty 1

## 2016-03-28 MED ORDER — AMITRIPTYLINE HCL 50 MG PO TABS
50.0000 mg | ORAL_TABLET | Freq: Every day | ORAL | Status: DC
  Administered 2016-03-28: 50 mg via ORAL
  Filled 2016-03-28: qty 1

## 2016-03-28 MED ORDER — SODIUM CHLORIDE 0.9 % IV SOLN
INTRAVENOUS | Status: AC
  Administered 2016-03-28 – 2016-03-29 (×2): via INTRAVENOUS

## 2016-03-28 MED ORDER — FENTANYL CITRATE (PF) 100 MCG/2ML IJ SOLN
50.0000 ug | Freq: Once | INTRAMUSCULAR | Status: AC
Start: 2016-03-28 — End: 2016-03-28
  Administered 2016-03-28: 50 ug via INTRAVENOUS

## 2016-03-28 MED ORDER — FENTANYL 50 MCG/HR TD PT72: 150.0000 ug | MEDICATED_PATCH | TRANSDERMAL | Status: DC

## 2016-03-28 MED ORDER — HYDROMORPHONE HCL 2 MG PO TABS
2.0000 mg | ORAL_TABLET | ORAL | Status: DC | PRN
  Administered 2016-03-28 – 2016-03-29 (×3): 2 mg via ORAL
  Filled 2016-03-28 (×3): qty 1

## 2016-03-28 MED ORDER — AMOXICILLIN-POT CLAVULANATE 875-125 MG PO TABS
1.0000 | ORAL_TABLET | Freq: Two times a day (BID) | ORAL | Status: DC
  Administered 2016-03-28 – 2016-03-29 (×3): 1 via ORAL
  Filled 2016-03-28 (×3): qty 1

## 2016-03-28 MED ORDER — ONDANSETRON HCL 4 MG/2ML IJ SOLN: 4.0000 mg | Freq: Four times a day (QID) | INTRAMUSCULAR | Status: DC | PRN

## 2016-03-28 MED ORDER — DULOXETINE HCL 60 MG PO CPEP
60.0000 mg | ORAL_CAPSULE | Freq: Every day | ORAL | Status: DC
  Administered 2016-03-28 – 2016-03-29 (×2): 60 mg via ORAL
  Filled 2016-03-28 (×2): qty 1

## 2016-03-28 MED ORDER — HYDROMORPHONE HCL 2 MG PO TABS
4.0000 mg | ORAL_TABLET | Freq: Once | ORAL | Status: AC
  Administered 2016-03-28: 4 mg via ORAL
  Filled 2016-03-28: qty 2

## 2016-03-28 MED ORDER — SODIUM CHLORIDE 0.9% FLUSH: 10.0000 mL | INTRAVENOUS | Status: DC | PRN

## 2016-03-28 MED ORDER — POTASSIUM CHLORIDE CRYS ER 20 MEQ PO TBCR
40.0000 meq | EXTENDED_RELEASE_TABLET | Freq: Once | ORAL | Status: AC
  Administered 2016-03-28: 40 meq via ORAL
  Filled 2016-03-28: qty 2

## 2016-03-28 NOTE — Consult Note (Addendum)
[place] Nurse wound consult note Reason for Consult: Consult requested for groin and area surrounding SP cath which is currently leaking.  Topical treatment for skin will be minimally effective until the tube is replaced and the leaking problem is resolved.  Pt states this is planned to be performed today in interventional radiology. Wound type: Skin surrounding SP cath insertion site and to upper groin area is red and macerated with macular papular scattered lesions; appearance is consistent with moisture associated skin damage and candidiasis.  Primary team; please consider systemic coverage if desired for yeast. Dressing procedure/placement/frequency: Barrier cream to protect skin and repel moisture.  Discussed plan of care with patient and he denies further questions. Please re-consult if further assistance is needed.  Thank-you,  Julien Girt MSN, [place], [place], [place], [place]

## 2016-03-28 NOTE — H&P (Signed)
History and Physical    Andrew Kline FIE:[id number] DOB: 1965/09/25 DOA: 03/27/2016  PCP: [name], MD  Patient coming from: Home.  Chief Complaint: Leaking suprapubic catheter.  HPI: Andrew Kline is a 50 y.o. male with history of rectal cancer status post colostomy bag placement and renal cell carcinoma status post partial nephrectomy and bilateral nephrostomy tube placement who was recently admitted for Sepsis secondary to presacral abscess, status post transgluteal drain placement / Cellulitis and pyomyositis of gluteusmaximus and left hip and upper thigh and is presently on Augmentin and doxycycline presents to the ER because of leaking suprapubic catheter over the last 24 hours. ER physician had discussed with on-call urologist who had advised interventional radiology consult for replacement of the catheter. In addition patient is complaining of worsening pain around the suprapubic catheter and pelvis. Creatinine is mildly elevated from baseline. Denies any nausea vomiting or diarrhea.  ED Course: See history of presenting illness.  Review of Systems: As per HPI, rest all negative.   Past Medical History:  Diagnosis Date  . Anxiety   . Arthritis   . Depression   . Hospice care   . Kidney stone 03/22/15   left  . Rectal cancer ([place])   . Rectal pain   . Renal cell cancer ([place])    2012.   [name] Shortness of breath dyspnea     Past Surgical History:  Procedure Laterality Date  . BIOPSY N/A 09/30/2014   Procedure: BIOPSY;  Surgeon: Danie Binder, MD;  Location: [facility] ORS;  Service: Endoscopy;  Laterality: N/A;  Anal Canal  . COLOSTOMY N/A 02/11/2015   Procedure: COLOSTOMY;  Surgeon: Aviva Signs, MD;  Location: [facility] ORS;  Service: General;  Laterality: N/A;  . FLEXIBLE SIGMOIDOSCOPY N/A 09/30/2014   Procedure: FLEXIBLE SIGMOIDOSCOPY;  Surgeon: Danie Binder, MD;  Location: [facility] ORS;  Service: Endoscopy;  Laterality: N/A;  . FLEXIBLE SIGMOIDOSCOPY N/A 10/17/2014   Procedure: FLEXIBLE SIGMOIDOSCOPY;  Surgeon: Danie Binder, MD;  Location: [facility] ENDO SUITE;  Service: Endoscopy;  Laterality: N/A;  1325  . HYPOSPADIAS CORRECTION    . IR GENERIC HISTORICAL  12/17/2015   IR CATHETER TUBE CHANGE 12/17/2015 [facility]-INTERV RAD  . IR GENERIC HISTORICAL  01/27/2016   IR NEPHROSTOMY PLACEMENT RIGHT 01/27/2016 [facility]INTERV RAD  . IR GENERIC HISTORICAL  01/27/2016   IR NEPHROSTOMY PLACEMENT LEFT 01/27/2016 [facility]  . IR GENERIC HISTORICAL  03/08/2016   IR NEPHROSTOMY EXCHANGE LEFT 03/08/2016 Greggory Keen, MD [facility]INTER[facility]  . IR GENERIC HISTORICAL  03/08/2016   IR NEPHROSTOMY EXCHANGE RIGHT 03/08/2016 Greggory Keen, MD [facility]  . IR GENERIC HISTORICAL  03/07/2016   IR CATHETER TUBE CHANGE 03/07/2016 [facility]INTER[facility]  . KNEE SURGERY Left    arthroscopy  . LAPAROSCOPIC PARTIAL NEPHRECTOMY  2012   right side  . LUNG BIOPSY Right   . PERINEAL PROCTECTOMY N/A 08/26/2015   Procedure:  PROCTECTOMY;  Surgeon: Aviva Signs, MD;  Location: [facility] ORS;  Service: General;  Laterality: N/A;  . RECTAL SURGERY       reports that he has been smoking Cigarettes.  He has a 15.00 pack-year smoking history. He has never used smokeless tobacco. He reports that he does not drink alcohol or use drugs.  Allergies  Allergen Reactions  . Oxaliplatin Itching    Family History  Problem Relation Age of Onset  . Healthy Mother   . Lung cancer Maternal Grandfather   . Colon cancer Maternal Uncle     age 34  .  Diabetes Other     Prior to Admission medications   Medication Sig Start Date End Date Taking? Authorizing Provider  acetaminophen (TYLENOL) 325 MG tablet Take 2 tablets (650 mg total) by mouth every 6 (six) hours as needed for mild pain (or Fever >/= 101). 03/16/16  Yes Eber Jones, MD  albuterol (PROVENTIL HFA;VENTOLIN HFA) 108 (90 Base) MCG/ACT inhaler Inhale 2 puffs into the lungs every 6 (six) hours as needed for wheezing or shortness of breath. 05/26/15  Yes Patrici Ranks,  MD  amitriptyline (ELAVIL) 50 MG tablet Take 1 tablet (50 mg total) by mouth at bedtime. 12/31/15  Yes Patrici Ranks, MD  amoxicillin-clavulanate (AUGMENTIN) 875-125 MG tablet Take 1 tablet by mouth every 12 (twelve) hours. 03/16/16 04/13/16 Yes Eber Jones, MD  docusate sodium (COLACE) 100 MG capsule Take 1 capsule (100 mg total) by mouth every 12 (twelve) hours. Patient taking differently: Take 100 mg by mouth daily as needed for mild constipation or moderate constipation.  09/25/14  Yes Christopher Lawyer, PA-C  doxycycline (VIBRA-TABS) 100 MG tablet Take 1 tablet (100 mg total) by mouth every 12 (twelve) hours. 03/16/16 04/13/16 Yes Eber Jones, MD  DULoxetine (CYMBALTA) 60 MG capsule Take 1 capsule (60 mg total) by mouth daily. 12/31/15  Yes Patrici Ranks, MD  fentaNYL (DURAGESIC - DOSED MCG/HR) 75 MCG/HR Place 2 patches (150 mcg total) onto the skin every 3 (three) days. 02/01/16  Yes Manon Hilding Kefalas, PA-C  HYDROmorphone (DILAUDID) 2 MG tablet Take 1 tablet (2 mg total) by mouth every 4 (four) hours as needed for severe pain. 03/16/16  Yes Eber Jones, MD  ondansetron (ZOFRAN) 4 MG tablet Take 1 tablet (4 mg total) by mouth every 6 (six) hours as needed for nausea. 03/16/16  Yes Eber Jones, MD  pregabalin (LYRICA) 75 MG capsule Take 2 capsules (150 mg total) by mouth 2 (two) times daily. 12/31/15  Yes Patrici Ranks, MD  zolpidem (AMBIEN CR) 12.5 MG CR tablet Take 12.5 mg by mouth at bedtime as needed for sleep.   Yes Historical Provider, MD    Physical Exam: Vitals:   03/28/16 0200 03/28/16 0230 03/28/16 0315 03/28/16 0351  BP: 169/97 162/97 171/93 (!) 176/88  Pulse: 80 81 85 76  Resp:    17  Temp:    97.7 F (36.5 C)  TempSrc:    Oral  SpO2: 100% 98% 95% 98%  Weight:    89.8 kg (197 lb 15.6 oz)  Height:    '5\' 5"'$  (1.651 m)      Constitutional: Moderately built and nourished. Vitals:   03/28/16 0200 03/28/16 0230 03/28/16 0315 03/28/16  0351  BP: 169/97 162/97 171/93 (!) 176/88  Pulse: 80 81 85 76  Resp:    17  Temp:    97.7 F (36.5 C)  TempSrc:    Oral  SpO2: 100% 98% 95% 98%  Weight:    89.8 kg (197 lb 15.6 oz)  Height:    '5\' 5"'$  (1.651 m)   Eyes: Anicteric no pallor. ENMT: No discharge from the ears eyes nose or mouth. Neck: No mass felt. No neck rigidity. Respiratory: No rhonchi or crepitations. Cardiovascular: S1 and S2 heard. Abdomen: Mild tenderness around the suprapubic catheter. Musculoskeletal: No edema. Skin: No rash. Neurologic: Alert awake oriented to time place and person. Psychiatric: Appears normal. Normal affect.   Labs on Admission: I have personally reviewed following labs and imaging studies  CBC:  Recent Labs  Lab 03/28/16 0107 03/28/16 0149  WBC 9.0  --   NEUTROABS 6.0  --   HGB 12.6* 12.9*  HCT 38.7* 38.0*  MCV 85.8  --   PLT 345  --    Basic Metabolic Panel:  Recent Labs Lab 03/28/16 0149  NA 140  K 3.7  CL 101  GLUCOSE 110*  BUN 8  CREATININE 1.40*   GFR: Estimated Creatinine Clearance: 63.6 mL/min (by C-G formula based on SCr of 1.4 mg/dL (H)). Liver Function Tests: No results for input(s): AST, ALT, ALKPHOS, BILITOT, PROT, ALBUMIN in the last 168 hours. No results for input(s): LIPASE, AMYLASE in the last 168 hours. No results for input(s): AMMONIA in the last 168 hours. Coagulation Profile: No results for input(s): INR, PROTIME in the last 168 hours. Cardiac Enzymes: No results for input(s): CKTOTAL, CKMB, CKMBINDEX, TROPONINI in the last 168 hours. BNP (last 3 results) No results for input(s): PROBNP in the last 8760 hours. HbA1C: No results for input(s): HGBA1C in the last 72 hours. CBG: No results for input(s): GLUCAP in the last 168 hours. Lipid Profile: No results for input(s): CHOL, HDL, LDLCALC, TRIG, CHOLHDL, LDLDIRECT in the last 72 hours. Thyroid Function Tests: No results for input(s): TSH, T4TOTAL, FREET4, T3FREE, THYROIDAB in the last 72  hours. Anemia Panel: No results for input(s): VITAMINB12, FOLATE, FERRITIN, TIBC, IRON, RETICCTPCT in the last 72 hours. Urine analysis:    Component Value Date/Time   COLORURINE YELLOW 03/28/2016 0048   APPEARANCEUR HAZY (A) 03/28/2016 0048   LABSPEC 1.012 03/28/2016 0048   PHURINE 6.0 03/28/2016 0048   GLUCOSEU NEGATIVE 03/28/2016 0048   HGBUR MODERATE (A) 03/28/2016 0048   BILIRUBINUR NEGATIVE 03/28/2016 0048   KETONESUR NEGATIVE 03/28/2016 0048   PROTEINUR 100 (A) 03/28/2016 0048   UROBILINOGEN 0.2 03/17/2015 0910   NITRITE NEGATIVE 03/28/2016 0048   LEUKOCYTESUR MODERATE (A) 03/28/2016 0048   Sepsis Labs: '@LABRCNTIP'$ (procalcitonin:4,lacticidven:4) ) Recent Results (from the past 240 hour(s))  Urine culture     Status: None (Preliminary result)   Collection Time: 03/28/16 12:48 AM  Result Value Ref Range Status   Specimen Description URINE, RANDOM  Final   Special Requests LEFT NEPHRECTOMY TUBE  Final   Culture PENDING  Incomplete   Report Status PENDING  Incomplete     Radiological Exams on Admission: No results found.   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 Dysfunctioning suprapubic catheter with leakage - interventional radiology consult requested. Since patient also has worsening pain in the abdomen have ordered plain CT abdomen. Continue pain relief medications and gentle hydration. 2. Acute renal failure - suspect prerenal cause. Gently hydrate and follow metabolic panel. 3. Rectal cancer and renal cell cancer status post colostomy and nephrostomy tube placements - outpatient follow-up with oncologist. 4. Chronic anemia - follow CBC. 5. Recently admitted for Sepsis secondary to presacral abscess, status post transgluteal drain placement / Cellulitis and pyomyositis of gluteusmaximus  and left hip and upper thigh on Augmentin and doxycycline.   DVT prophylaxis: SCDs. Code Status: DO NOT RESUSCITATE. Family Communication: Discussed with patient.  Disposition Plan: Home.  Consults called: Intervention radiology.  Admission status: Observation.    Rise Patience MD [facility] Pager [phone].  If 7PM-7AM, please contact night-coverage [email or web address] Password [facility]  03/28/2016, 5:24 AM

## 2016-03-28 NOTE — Consult Note (Signed)
This man is known to me since his definitive rectal resection by Dr Arnoldo Morale in [place]. He had an unintended urethral injury that was repaired by Dr Arnoldo Morale, but due to his prior radiation and the nature of the injury/surgery did not heal. He had a suprapubic tube placed to properly drain his bladder, as a urethral catheter would not pass the injured urethra and subsequent pelvic cavity created. He then had bilateral percutaneous nephrostomy tubes placed by interventional radiology to divert urine from his bladder in order to attempt to dry up his pelvic abscess/cavity.   From a GU standpoint all we can provide is proper direction to have pcn tubes replaced as well as recommend replacing his sp tube, both of which have been managed by IR.  He has a difficult problem, and I am afraid that he will need these drains for the foreseeable future. He has attempted to undergo chemotherapy @ the [facility], but unfortunately has not tolerated this well due to his pelvic process. He has somewhat limited understanding of his predicament, despite several visits to my office and detailed discussions.  I would recommend that this man keep regular appointments with IR to change his tubes regularly and not on a urgent basis such as now.

## 2016-03-28 NOTE — Progress Notes (Signed)
0900 Pt to Interventional Radiology for nephrostomy tube replacement and suprapubic cath replacement.  1230 Pt is back from a procedure. Left nephrostomy tube with dressing dry and intact, connected to a drainage bag, clear yellow urine noted. Supra-pubic cath intact, with dressing dry and intact. S/p tube connected to a drainage bag with clear yellow urine noted.

## 2016-03-28 NOTE — Progress Notes (Signed)
Patient admitted after midnight, please see H&P.  Suprapubic catheter changed.  Unable to replace right PCN tube-- await IR recommendations regarding how to proceed  Eulogio Bear DO

## 2016-03-28 NOTE — ED Notes (Signed)
Phlebotomy at bedside at this time.

## 2016-03-28 NOTE — Procedures (Signed)
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  The bilateral PCN were placed 9/27 for diversion to allow healing of ureteral injury.    Patient has had accidental displacement of the right PCN ~ 4 days ago.  Rescue attempt today. Will routine exchange the left  Procedure: Exchange of the left PCN with new 20F catheter.  Attempt to rescue the right PCN.  No tract could be identified.  Replacement/rescue could not be completed.  .  Complications: None Recommendations:  - Continue routine exchange of the left PCN.  - Consider whether the right will need replacement via a formal PCN, or whether diversion via catheter can be deferred without replacement. - Do not submerge. - Routine wound care   Signed,  Dulcy Fanny. Earleen Newport, DO

## 2016-03-28 NOTE — Procedures (Signed)
Interventional Radiology Procedure Note  Procedure:  Replacement of suprapubic catheter, 30F.  Complications: None Recommendations:  - If 9F catheter is warranted, then will need moderate sedation.   - Do not submerge - Routine care   Signed,  Dulcy Fanny. Earleen Newport, DO

## 2016-03-29 DIAGNOSIS — C20 Malignant neoplasm of rectum: Secondary | ICD-10-CM

## 2016-03-29 DIAGNOSIS — C7800 Secondary malignant neoplasm of unspecified lung: Secondary | ICD-10-CM

## 2016-03-29 LAB — URINE CULTURE: Culture: 10000 — AB

## 2016-03-29 LAB — CBC
HEMATOCRIT: 35.5 % — AB (ref 39.0–52.0)
HEMOGLOBIN: 11.8 g/dL — AB (ref 13.0–17.0)
MCH: 28.7 pg (ref 26.0–34.0)
MCHC: 33.2 g/dL (ref 30.0–36.0)
MCV: 86.4 fL (ref 78.0–100.0)
Platelets: 311 10*3/uL (ref 150–400)
RBC: 4.11 MIL/uL — ABNORMAL LOW (ref 4.22–5.81)
RDW: 19 % — ABNORMAL HIGH (ref 11.5–15.5)
WBC: 8 10*3/uL (ref 4.0–10.5)

## 2016-03-29 LAB — BASIC METABOLIC PANEL
ANION GAP: 7 (ref 5–15)
BUN: 9 mg/dL (ref 6–20)
CHLORIDE: 104 mmol/L (ref 101–111)
CO2: 26 mmol/L (ref 22–32)
Calcium: 8.5 mg/dL — ABNORMAL LOW (ref 8.9–10.3)
Creatinine, Ser: 1.37 mg/dL — ABNORMAL HIGH (ref 0.61–1.24)
GFR calc Af Amer: >60 mL/min (ref >60)
GFR, EST NON AFRICAN AMERICAN: 58 mL/min — AB (ref >60)
GLUCOSE: 92 mg/dL (ref 65–99)
POTASSIUM: 3.8 mmol/L (ref 3.5–5.1)
Sodium: 137 mmol/L (ref 135–145)

## 2016-03-29 MED ORDER — HEPARIN SOD (PORK) LOCK FLUSH 100 UNIT/ML IV SOLN
500.0000 [IU] | INTRAVENOUS | Status: AC | PRN
  Administered 2016-03-29: 500 [IU]

## 2016-03-29 MED ORDER — HYDROMORPHONE HCL 2 MG PO TABS: 2.0000 mg | ORAL_TABLET | ORAL | 0 refills | Status: DC | PRN

## 2016-03-29 MED ORDER — HYDROMORPHONE HCL 2 MG PO TABS
2.0000 mg | ORAL_TABLET | ORAL | Status: DC | PRN
  Administered 2016-03-29: 4 mg via ORAL
  Filled 2016-03-29: qty 2

## 2016-03-29 MED ORDER — FLUCONAZOLE 100 MG PO TABS: 100.0000 mg | ORAL_TABLET | Freq: Every day | ORAL | 0 refills | Status: DC

## 2016-03-29 NOTE — Discharge Summary (Signed)
Physician Discharge Summary  Andrew Kline ZOX:[id number] DOB: 08-Oct-1965 DOA: 03/27/2016  PCP: [name], MD  Admit date: 03/27/2016 Discharge date: 03/29/2016   Recommendations for Outpatient Follow-Up:   1. Home with [facility] 2. 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  Diet recommendation:   Regular.  Wound care: None.   History of Present Illness:   Andrew Kline is a 50 y.o. male with history of rectal cancer status post colostomy bag placement and renal cell carcinoma status post partial nephrectomy and bilateral nephrostomy tube placement who was recently admitted for Sepsis secondary to presacral abscess, status post transgluteal drain placement / Cellulitis and pyomyositis of gluteusmaximus and left hip and upper thigh and is presently on Augmentin and doxycycline presents to the ER because of leaking suprapubic catheter over the last 24 hours. ER physician had discussed with on-call urologist who had advised interventional radiology consult for replacement of the catheter. In addition patient is complaining of worsening pain around the suprapubic catheter and pelvis. Creatinine is mildly elevated from baseline. Denies any nausea vomiting or diarrhea.   Hospital Course by Problem:   Yeast groin infection -diflucan and barrier cream  Leaking suprapubic catheter -exchanged -will need routine exchange again 6 weeks  Right PCN unable to be replaced -discussed with IR -will leave out for now -routine change of left tube in 6 weeks  Patient going home with hospice again.  Pain control appears to be an issue.  Patient "weaned" himself off of the fentayl patched and prefers to  take dilaudid for his pain control-- defer to hospice    Medical Consultants:    IR   Discharge Exam:   Vitals:   03/28/16 2135 03/29/16 0528  BP: 140/68 131/79  Pulse: 66 72  Resp: 18 18  Temp: 98.3 F (36.8 C) 98 F (36.7 C)   Vitals:   03/28/16 1258 03/28/16 1417 03/28/16 2135 03/29/16 0528  BP: (!) 147/75 132/73 140/68 131/79  Pulse: 73 72 66 72  Resp: '18 19 18 18  '$ Temp: 97.4 F (36.3 C) 98.1 F (36.7 C) 98.3 F (36.8 C) 98 F (36.7 C)  TempSrc: Oral Oral Oral Oral  SpO2: 99% 95% 95% 94%  Weight:      Height:        Gen:  NAD    The results of significant diagnostics from this hospitalization (including imaging, microbiology, ancillary and laboratory) are listed below for reference.     Procedures and Diagnostic Studies:   Ir Catheter Tube Change  Result Date: 03/28/2016 INDICATION: 50 year old male with a history of rectal cancer, status post APR. Known iatrogenic injury with suprapubic catheter placement and bilateral PCN for diversion and attempted healing. He has had his right percutaneous nephrostomy displaced as well as his suprapubic catheter displaced. He returns for rescue. We will plan on exchanging the left routinely. EXAM: PERC TUBE CHG W/CM; IR CATHETER TUBE CHANGE COMPARISON:  Most recent 03/08/2016 MEDICATIONS: 50 mcg fentanyl ANESTHESIA/SEDATION: None CONTRAST:  35 cc - administered into the collecting system(s) FLUOROSCOPY TIME:  Fluoroscopy Time: 5 minutes 40 a seconds (52.6 mGy). COMPLICATIONS: None PROCEDURE: Informed written consent was obtained from the patient after a thorough discussion of the procedural risks, benefits and alternatives. All questions  were addressed. Maximal Sterile Barrier Technique was utilized including caps, mask, sterile gowns, sterile gloves, sterile drape, hand hygiene and skin antiseptic. A timeout was performed prior to the initiation of the procedure. Patient position prone position, and was prepped and draped in  the usual sterile fashion. We first performed a routine exchange of the left percutaneous nephrostomy tube. Generous infiltration of the skin and subcutaneous tissues was performed at the skin. Small amount contrast confirmed location within the left collecting system, and modified Seldinger technique was used to exchange the PCN for a new 10 French drainage tube. Contrast confirmed positioning within the lower pole moiety collecting system with no evidence of dilation of the superior moiety. Catheter was sutured in position attached to a drainage bag. We turned our attention to the right PCN site. Attempt was made to identify a patent track through the scan and posterior soft tissues to the right kidney. Generous infiltration of the skin and subcutaneous tissues was performed with 1% lidocaine. Contrast was attempted to be injected with several technique. Partial opacification was accomplished of the tract, which could not be connected to the collecting system. The patient was then positioned in the supine position, and the suprapubic catheter was prepped and draped in the usual sterile fashion. The patient experienced extreme tenderness in this region. Once the patient is prepped and draped, contrast injection confirmed at the suprapubic catheter had been entirely withdrawn into the superficial soft tissues. The catheter was removed and contrast was injected via a syringe to identify a tract into the urinary bladder. A Kumpe the catheter was advanced through the tract into the urinary bladder. Using modified Seldinger technique, a new 28 French catheter was placed into the distended urinary bladder over the Bentson wire. Pigtail catheter was formed and attached to gravity drainage bag. Contrast injected confirmed location within the urinary bladder. Patient remained hemodynamically stable throughout. No complications were encountered.  No significant blood loss. IMPRESSION: Status post routine exchange of left  percutaneous nephrostomy, rescue of a displaced suprapubic catheter, and failed attempted rescue of completely displaced right percutaneous nephrostomy tube. Signed, Dulcy Fanny. Earleen Newport, DO [facility] Radiology PLAN: Should the patient require diversion of the right collecting system, a new puncture will be required with standard PCN technique. This will require moderate sedation. Should the 14 French suprapubic catheter need to be upsized to a 42 [place], would suggest moderate sedation given the patient's irritation of the skin and the small caliber soft tissue tract which will likely require dilation. Electronically Signed   By: Corrie Mckusick D.O.   On: 03/28/2016 12:16   Ir Nephrostomy Tube Change  Result Date: 03/28/2016 INDICATION: 50 year old male with a history of rectal cancer, status post APR. Known iatrogenic injury with suprapubic catheter placement and bilateral PCN for diversion and attempted healing. He has had his right percutaneous nephrostomy displaced as well as his suprapubic catheter displaced. He returns for rescue. We will plan on exchanging the left routinely. EXAM: PERC TUBE CHG W/CM; IR CATHETER TUBE CHANGE COMPARISON:  Most recent 03/08/2016 MEDICATIONS: 50 mcg fentanyl ANESTHESIA/SEDATION: None CONTRAST:  35 cc - administered into the collecting system(s) FLUOROSCOPY TIME:  Fluoroscopy Time: 5 minutes 40 a seconds (52.6 mGy). COMPLICATIONS: None PROCEDURE: Informed written consent was obtained from the patient after a thorough discussion of the procedural risks, benefits and alternatives. All questions were addressed. Maximal Sterile Barrier Technique was utilized including caps, mask, sterile gowns, sterile gloves, sterile drape, hand hygiene and skin antiseptic.  A timeout was performed prior to the initiation of the procedure. Patient position prone position, and was prepped and draped in the usual sterile fashion. We first performed a  routine exchange of the left percutaneous nephrostomy tube. Generous infiltration of the skin and subcutaneous tissues was performed at the skin. Small amount contrast confirmed location within the left collecting system, and modified Seldinger technique was used to exchange the PCN for a new 10 French drainage tube. Contrast confirmed positioning within the lower pole moiety collecting system with no evidence of dilation of the superior moiety. Catheter was sutured in position attached to a drainage bag. We turned our attention to the right PCN site. Attempt was made to identify a patent track through the scan and posterior soft tissues to the right kidney. Generous infiltration of the skin and subcutaneous tissues was performed with 1% lidocaine. Contrast was attempted to be injected with several technique. Partial opacification was accomplished of the tract, which could not be connected to the collecting system. The patient was then positioned in the supine position, and the suprapubic catheter was prepped and draped in the usual sterile fashion. The patient experienced extreme tenderness in this region. Once the patient is prepped and draped, contrast injection confirmed at the suprapubic catheter had been entirely withdrawn into the superficial soft tissues. The catheter was removed and contrast was injected via a syringe to identify a tract into the urinary bladder. A Kumpe the catheter was advanced through the tract into the urinary bladder. Using modified Seldinger technique, a new 76 French catheter was placed into the distended urinary bladder over the Bentson wire. Pigtail catheter was formed and attached to gravity drainage bag. Contrast injected confirmed location within the urinary bladder. Patient remained hemodynamically stable throughout. No complications were encountered.  No significant blood loss. IMPRESSION: Status post routine exchange of left percutaneous nephrostomy, rescue of a displaced  suprapubic catheter, and failed attempted rescue of completely displaced right percutaneous nephrostomy tube. Signed, Dulcy Fanny. Earleen Newport, DO [facility] Radiology PLAN: Should the patient require diversion of the right collecting system, a new puncture will be required with standard PCN technique. This will require moderate sedation. Should the 14 French suprapubic catheter need to be upsized to a 30 [place], would suggest moderate sedation given the patient's irritation of the skin and the small caliber soft tissue tract which will likely require dilation. Electronically Signed   By: Corrie Mckusick D.O.   On: 03/28/2016 12:16     Labs:   Basic Metabolic Panel:  Recent Labs Lab 03/28/16 0149 03/28/16 0630 03/29/16 0445  NA 140 137 137  K 3.7 3.2* 3.8  CL 101 102 104  CO2  --  26 26  GLUCOSE 110* 96 92  BUN '8 7 9  '$ CREATININE 1.40* 1.28* 1.37*  CALCIUM  --  8.5* 8.5*   GFR Estimated Creatinine Clearance: 64.9 mL/min (by C-G formula based on SCr of 1.37 mg/dL (H)). Liver Function Tests: No results for input(s): AST, ALT, ALKPHOS, BILITOT, PROT, ALBUMIN in the last 168 hours. No results for input(s): LIPASE, AMYLASE in the last 168 hours. No results for input(s): AMMONIA in the last 168 hours. Coagulation profile No results for input(s): INR, PROTIME in the last 168 hours.  CBC:  Recent Labs Lab 03/28/16 0107 03/28/16 0149 03/28/16 0630 03/29/16 0445  WBC 9.0  --  7.8 8.0  NEUTROABS 6.0  --  5.0  --   HGB 12.6* 12.9* 12.3* 11.8*  HCT  38.7* 38.0* 37.8* 35.5*  MCV 85.8  --  85.9 86.4  PLT 345  --  328 311   Cardiac Enzymes: No results for input(s): CKTOTAL, CKMB, CKMBINDEX, TROPONINI in the last 168 hours. BNP: Invalid input(s): POCBNP CBG: No results for input(s): GLUCAP in the last 168 hours. D-Dimer No results for input(s): DDIMER in the last 72 hours. Hgb A1c No results for input(s): HGBA1C in the last 72 hours. Lipid  Profile No results for input(s): CHOL, HDL, LDLCALC, TRIG, CHOLHDL, LDLDIRECT in the last 72 hours. Thyroid function studies No results for input(s): TSH, T4TOTAL, T3FREE, THYROIDAB in the last 72 hours.  Invalid input(s): FREET3 Anemia work up No results for input(s): VITAMINB12, FOLATE, FERRITIN, TIBC, IRON, RETICCTPCT in the last 72 hours. Microbiology Recent Results (from the past 240 hour(s))  Urine culture     Status: Abnormal   Collection Time: 03/28/16 12:48 AM  Result Value Ref Range Status   Specimen Description URINE, RANDOM  Final   Special Requests LEFT NEPHRECTOMY TUBE  Final   Culture 10,000 COLONIES/mL YEAST (A)  Final   Report Status 03/29/2016 FINAL  Final     Discharge Instructions:   Discharge Instructions    Diet - low sodium heart healthy    Complete by:  As directed    Discharge instructions    Complete by:  As directed    Hospice to address pain management Routine supra pubic and L PCN drain exchange in 6 weeks   Increase activity slowly    Complete by:  As directed        Medication List    STOP taking these medications   fentaNYL 75 MCG/HR Commonly known as:  DURAGESIC - dosed mcg/hr     TAKE these medications   acetaminophen 325 MG tablet Commonly known as:  TYLENOL Take 2 tablets (650 mg total) by mouth every 6 (six) hours as needed for mild pain (or Fever >/= 101).   albuterol 108 (90 Base) MCG/ACT inhaler Commonly known as:  PROVENTIL HFA;VENTOLIN HFA Inhale 2 puffs into the lungs every 6 (six) hours as needed for wheezing or shortness of breath.   amitriptyline 50 MG tablet Commonly known as:  ELAVIL Take 1 tablet (50 mg total) by mouth at bedtime.   amoxicillin-clavulanate 875-125 MG tablet Commonly known as:  AUGMENTIN Take 1 tablet by mouth every 12 (twelve) hours.   docusate sodium 100 MG capsule Commonly known as:  COLACE Take 1 capsule (100 mg total) by mouth every 12 (twelve) hours. What changed:  when to take  this  reasons to take this   doxycycline 100 MG tablet Commonly known as:  VIBRA-TABS Take 1 tablet (100 mg total) by mouth every 12 (twelve) hours.   DULoxetine 60 MG capsule Commonly known as:  CYMBALTA Take 1 capsule (60 mg total) by mouth daily.   fluconazole 100 MG tablet Commonly known as:  DIFLUCAN Take 1 tablet (100 mg total) by mouth daily. Start taking on:  03/30/2016   HYDROmorphone 2 MG tablet Commonly known as:  DILAUDID Take 1-2 tablets (2-4 mg total) by mouth every 4 (four) hours as needed for severe pain. What changed:  how much to take   ondansetron 4 MG tablet Commonly known as:  ZOFRAN Take 1 tablet (4 mg total) by mouth every 6 (six) hours as needed for nausea.   pregabalin 75 MG capsule Commonly known as:  LYRICA Take 2 capsules (150 mg total) by mouth 2 (two) times daily.  zolpidem 12.5 MG CR tablet Commonly known as:  AMBIEN CR Take 12.5 mg by mouth at bedtime as needed for sleep.      Follow-up Information    [facility] Follow up.            Time coordinating discharge: 35 min  Signed:  Tobyn Osgood U Cecile Guevara   [facility] 03/29/2016, 11:58 AM

## 2016-03-29 NOTE — Progress Notes (Signed)
Pt is anxious to go home. Dressing to s/p cath and left nephrostomy tube changed. Red rashes to right groin better today. Discharge instructions given to pt. Discharged to home accompanied by girlfriend.

## 2016-03-29 NOTE — Care Management (Addendum)
Received a call from Andrew Kline [facility] , patient is active with them . [name] RN BSN [phone]   Patient to discharge home today with [facility] . [facility] aware. Patient states family will transport him home.  [name] RN BSN [phone]

## 2016-04-05 ENCOUNTER — Ambulatory Visit (INDEPENDENT_AMBULATORY_CARE_PROVIDER_SITE_OTHER): Payer: Medicaid Other | Admitting: Urology

## 2016-04-05 DIAGNOSIS — Z85048 Personal history of other malignant neoplasm of rectum, rectosigmoid junction, and anus: Secondary | ICD-10-CM | POA: Diagnosis not present

## 2016-04-05 DIAGNOSIS — N360 Urethral fistula: Secondary | ICD-10-CM | POA: Diagnosis not present

## 2016-04-12 ENCOUNTER — Other Ambulatory Visit: Payer: Self-pay | Admitting: Urology

## 2016-04-12 DIAGNOSIS — C189 Malignant neoplasm of colon, unspecified: Secondary | ICD-10-CM

## 2016-04-12 NOTE — ED Provider Notes (Signed)
[facility] DEPT Provider Note   CSN: [id number] Arrival date & time: 03/27/16  2129     History   Chief Complaint Chief Complaint  Patient presents with  . suprapubic catheter leaking    HPI Andrew Kline is a 50 y.o. male.  Patient with history of kidney CA (s/p bilateral nephrostomy tubes), rectal CA (s/p colostomy) who presents with his wife with concern for leaking around his suprapubic catheter x 1 day. No bleeding. He has had no fever and denies significant pain. No nausea or vomiting. He also reports his right nephrostomy tube came out 3 days ago.   The history is provided by the patient and the spouse. No language interpreter was used.    Past Medical History:  Diagnosis Date  . Anxiety   . Arthritis   . Depression   . Hospice care   . Kidney stone 03/22/15   left  . Rectal cancer ([place])   . Rectal pain   . Renal cell cancer ([place])    2012.   [name] Shortness of breath dyspnea     Patient Active Problem List   Diagnosis Date Noted  . Urinary retention 03/28/2016  . 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 Goals of care, counseling/discussion   . DNR (do not resuscitate) discussion   . Pyomyositis: Left gluteus maximus per CT 03/07/2016 03/08/2016  . Carcinoma of kidney ([place])   . Sepsis affecting skin ([place])   . Suprapubic catheter ([place])   . Uncontrolled pain   . Palliative care encounter   . Pelvic abscess in male [facility])   . Sepsis ([place]) 03/04/2016  . Renal cell carcinoma ([place]) 03/04/2016  . Hyponatremia 03/04/2016  . Complicated UTI (urinary tract infection) 01/25/2016  . Physical deconditioning 07/07/2015  . Muscular deconditioning 07/07/2015  . Malnutrition of moderate degree ([place]) 02/13/2015  . 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 BIOPSY N/A 09/30/2014   Procedure: BIOPSY;  Surgeon: Danie Binder, MD;  Location: [facility];  Service: Endoscopy;  Laterality: N/A;  Anal  Canal  . COLOSTOMY N/A 02/11/2015   Procedure: COLOSTOMY;  Surgeon: Aviva Signs, MD;  Location: [facility];  Service: General;  Laterality: N/A;  . FLEXIBLE SIGMOIDOSCOPY N/A 09/30/2014   Procedure: FLEXIBLE SIGMOIDOSCOPY;  Surgeon: Danie Binder, MD;  Location: [facility];  Service: Endoscopy;  Laterality: N/A;  . FLEXIBLE SIGMOIDOSCOPY N/A 10/17/2014   Procedure: FLEXIBLE SIGMOIDOSCOPY;  Surgeon: Danie Binder, MD;  Location: [facility] SUITE;  Service: Endoscopy;  Laterality: N/A;  1325  . HYPOSPADIAS CORRECTION    . IR GENERIC HISTORICAL  12/17/2015   IR CATHETER TUBE CHANGE 12/17/2015 [facility]  . IR GENERIC HISTORICAL  01/27/2016   IR NEPHROSTOMY PLACEMENT RIGHT 01/27/2016 [facility]  . IR GENERIC HISTORICAL  01/27/2016   IR NEPHROSTOMY PLACEMENT LEFT 01/27/2016 [facility]  . IR GENERIC HISTORICAL  03/08/2016   IR NEPHROSTOMY EXCHANGE LEFT 03/08/2016 Greggory Keen, MD [facility]  . IR GENERIC HISTORICAL  03/08/2016   IR NEPHROSTOMY EXCHANGE RIGHT 03/08/2016 Greggory Keen, MD [facility]  . IR GENERIC HISTORICAL  03/07/2016   IR CATHETER TUBE CHANGE 03/07/2016 [facility]  . IR GENERIC HISTORICAL  03/28/2016   IR CATHETER TUBE CHANGE 03/28/2016 Corrie Mckusick, DO [facility]  . IR GENERIC HISTORICAL  03/28/2016   IR NEPHROSTOMY TUBE CHANGE 03/28/2016 Corrie Mckusick, DO [facility]  . KNEE SURGERY Left    arthroscopy  .  LAPAROSCOPIC PARTIAL NEPHRECTOMY  2012   right side  . LUNG BIOPSY Right   . PERINEAL PROCTECTOMY N/A 08/26/2015   Procedure:  PROCTECTOMY;  Surgeon: Aviva Signs, MD;  Location: [facility];  Service: General;  Laterality: N/A;  . RECTAL SURGERY         Home Medications    Prior to Admission medications   Medication Sig Start Date End Date Taking? Authorizing Provider  acetaminophen (TYLENOL) 325 MG tablet Take 2 tablets (650 mg total) by mouth every 6 (six) hours as needed for mild pain (or Fever >/= 101). 03/16/16  Yes Eber Jones, MD  albuterol  (PROVENTIL HFA;VENTOLIN HFA) 108 (90 Base) MCG/ACT inhaler Inhale 2 puffs into the lungs every 6 (six) hours as needed for wheezing or shortness of breath. 05/26/15  Yes Patrici Ranks, MD  amitriptyline (ELAVIL) 50 MG tablet Take 1 tablet (50 mg total) by mouth at bedtime. 12/31/15  Yes Patrici Ranks, MD  amoxicillin-clavulanate (AUGMENTIN) 875-125 MG tablet Take 1 tablet by mouth every 12 (twelve) hours. 03/16/16 04/13/16 Yes Eber Jones, MD  docusate sodium (COLACE) 100 MG capsule Take 1 capsule (100 mg total) by mouth every 12 (twelve) hours. Patient taking differently: Take 100 mg by mouth daily as needed for mild constipation or moderate constipation.  09/25/14  Yes Christopher Lawyer, PA-C  doxycycline (VIBRA-TABS) 100 MG tablet Take 1 tablet (100 mg total) by mouth every 12 (twelve) hours. 03/16/16 04/13/16 Yes Eber Jones, MD  DULoxetine (CYMBALTA) 60 MG capsule Take 1 capsule (60 mg total) by mouth daily. 12/31/15  Yes Patrici Ranks, MD  ondansetron (ZOFRAN) 4 MG tablet Take 1 tablet (4 mg total) by mouth every 6 (six) hours as needed for nausea. 03/16/16  Yes Eber Jones, MD  pregabalin (LYRICA) 75 MG capsule Take 2 capsules (150 mg total) by mouth 2 (two) times daily. 12/31/15  Yes Patrici Ranks, MD  zolpidem (AMBIEN CR) 12.5 MG CR tablet Take 12.5 mg by mouth at bedtime as needed for sleep.   Yes Historical Provider, MD  fluconazole (DIFLUCAN) 100 MG tablet Take 1 tablet (100 mg total) by mouth daily. 03/30/16   Geradine Girt, DO  HYDROmorphone (DILAUDID) 2 MG tablet Take 1-2 tablets (2-4 mg total) by mouth every 4 (four) hours as needed for severe pain. 03/29/16   Geradine Girt, DO    Family History Family History  Problem Relation Age of Onset  . Healthy Mother   . Lung cancer Maternal Grandfather   . Colon cancer Maternal Uncle     age 44  . Diabetes Other     Social History Social History  Substance Use Topics  . Smoking status:  Current Every Day Smoker    Packs/day: 0.50    Years: 30.00    Types: Cigarettes  . Smokeless tobacco: Never Used  . Alcohol use No     Comment: Occ     Allergies   Oxaliplatin   Review of Systems Review of Systems  Constitutional: Negative for fever.  Respiratory: Negative.  Negative for shortness of breath.   Cardiovascular: Negative.  Negative for chest pain.  Gastrointestinal: Negative.  Negative for abdominal pain, nausea and vomiting.  Genitourinary: Negative for hematuria.       See HPI  Musculoskeletal: Negative for back pain and myalgias.  Skin: Negative for color change.  Neurological: Negative for weakness and light-headedness.     Physical Exam Updated Vital Signs BP 131/79 (BP Location:  Right Arm)   Pulse 72   Temp 98 F (36.7 C) (Oral)   Resp 18   Ht '5\' 5"'$  (1.651 m)   Wt 89.8 kg   SpO2 94%   BMI 32.94 kg/m   Physical Exam  Constitutional: He is oriented to person, place, and time. He appears well-developed and well-nourished.  HENT:  Head: Normocephalic.  Neck: Normal range of motion. Neck supple.  Cardiovascular: Normal rate and regular rhythm.   No murmur heard. Pulmonary/Chest: Effort normal and breath sounds normal.  Abdominal: Soft. Bowel sounds are normal. There is no tenderness. There is no rebound and no guarding.  Genitourinary:  Genitourinary Comments: Suprapubic catheter in place. Site unremarkable. No bleeding. Mildly tender. There is a small amount urine in the drainage bag but no active inflow. Nephrostomy tube on left, absent on right. No CVA tenderness.  Musculoskeletal: Normal range of motion.  Neurological: He is alert and oriented to person, place, and time.  Skin: Skin is warm and dry. No rash noted.  Psychiatric: He has a normal mood and affect.     ED Treatments / Results  Labs (all labs ordered are listed, but only abnormal results are displayed) Labs Reviewed  URINE CULTURE - Abnormal; Notable for the following:        Result Value   Culture 10,000 COLONIES/mL YEAST (*)    All other components within normal limits  URINALYSIS, ROUTINE W REFLEX MICROSCOPIC (NOT AT [facility]) - Abnormal; Notable for the following:    APPearance HAZY (*)    Hgb urine dipstick MODERATE (*)    Protein, ur 100 (*)    Leukocytes, UA MODERATE (*)    All other components within normal limits  CBC WITH DIFFERENTIAL/PLATELET - Abnormal; Notable for the following:    Hemoglobin 12.6 (*)    HCT 38.7 (*)    RDW 19.0 (*)    All other components within normal limits  URINE MICROSCOPIC-ADD ON - Abnormal; Notable for the following:    Squamous Epithelial / LPF 0-5 (*)    Bacteria, UA FEW (*)    All other components within normal limits  BASIC METABOLIC PANEL - Abnormal; Notable for the following:    Potassium 3.2 (*)    Creatinine, Ser 1.28 (*)    Calcium 8.5 (*)    All other components within normal limits  CBC WITH DIFFERENTIAL/PLATELET - Abnormal; Notable for the following:    Hemoglobin 12.3 (*)    HCT 37.8 (*)    RDW 18.9 (*)    All other components within normal limits  BASIC METABOLIC PANEL - Abnormal; Notable for the following:    Creatinine, Ser 1.37 (*)    Calcium 8.5 (*)    GFR calc non Af Amer 58 (*)    All other components within normal limits  CBC - Abnormal; Notable for the following:    RBC 4.11 (*)    Hemoglobin 11.8 (*)    HCT 35.5 (*)    RDW 19.0 (*)    All other components within normal limits  I-STAT CHEM 8, ED - Abnormal; Notable for the following:    Creatinine, Ser 1.40 (*)    Glucose, Bld 110 (*)    Calcium, Ion 1.07 (*)    Hemoglobin 12.9 (*)    HCT 38.0 (*)    All other components within normal limits    EKG  EKG Interpretation None       Radiology No results found.  Procedures Procedures (including critical care  time)  Medications Ordered in ED Medications  0.9 %  sodium chloride infusion ( Intravenous New Bag/Given 03/29/16 0251)  lidocaine (XYLOCAINE) 1 % (with pres)  injection (not administered)  fentaNYL (SUBLIMAZE) 100 MCG/2ML injection (not administered)  HYDROmorphone (DILAUDID) tablet 4 mg (4 mg Oral Given 03/28/16 0205)  iopamidol (ISOVUE-300) 61 % injection (35 mLs  Contrast Given 03/28/16 1111)  fentaNYL (SUBLIMAZE) injection 50 mcg (50 mcg Intravenous Given 03/28/16 1128)  potassium chloride SA (K-DUR,KLOR-CON) CR tablet 40 mEq (40 mEq Oral Given 03/28/16 1259)  heparin lock flush 100 unit/mL (500 Units Intracatheter Given 03/29/16 1208)     Initial Impression / Assessment and Plan / ED Course  I have reviewed the triage vital signs and the nursing notes.  Pertinent labs & imaging results that were available during my care of the patient were reviewed by me and considered in my medical decision making (see chart for details).  Clinical Course     Discussed with urology who advises the catheter will need to be replaced by IR and requests a medical admission. The patient has no fever or sign of infection - afebrile, UA "few bacteria".  Discussed with Dr. Hal Hope with [facility] who accepts the patient onto their service.   Final Clinical Impressions(s) / ED Diagnoses   Final diagnoses:  Urinary retention    New Prescriptions Discharge Medication List as of 03/29/2016  2:10 PM    START taking these medications   Details  fluconazole (DIFLUCAN) 100 MG tablet Take 1 tablet (100 mg total) by mouth daily., Starting Wed 03/30/2016, Print         Charlann Lange, PA-C 04/12/16 8110    Leo Grosser, MD 04/13/16 [phone]

## 2016-05-03 ENCOUNTER — Ambulatory Visit (HOSPITAL_COMMUNITY)
Admission: RE | Admit: 2016-05-03 | Discharge: 2016-05-03 | Disposition: A | Discharge status: Home / Self Care | Payer: Medicaid Other | Source: Ambulatory Visit | Attending: Urology | Admitting: Urology

## 2016-05-03 DIAGNOSIS — R918 Other nonspecific abnormal finding of lung field: Secondary | ICD-10-CM | POA: Insufficient documentation

## 2016-05-03 DIAGNOSIS — Z933 Colostomy status: Secondary | ICD-10-CM | POA: Diagnosis not present

## 2016-05-03 DIAGNOSIS — C189 Malignant neoplasm of colon, unspecified: Secondary | ICD-10-CM | POA: Diagnosis present

## 2016-05-03 MED ORDER — IOPAMIDOL (ISOVUE-300) INJECTION 61%
100.0000 mL | Freq: Once | INTRAVENOUS | Status: AC | PRN
  Administered 2016-05-03: 100 mL via INTRAVENOUS

## 2016-05-04 ENCOUNTER — Other Ambulatory Visit (HOSPITAL_COMMUNITY): Payer: Self-pay | Admitting: Hematology & Oncology

## 2016-05-04 DIAGNOSIS — C20 Malignant neoplasm of rectum: Secondary | ICD-10-CM

## 2016-05-04 DIAGNOSIS — K6289 Other specified diseases of anus and rectum: Secondary | ICD-10-CM

## 2016-05-04 DIAGNOSIS — C7800 Secondary malignant neoplasm of unspecified lung: Principal | ICD-10-CM

## 2016-06-07 ENCOUNTER — Ambulatory Visit (INDEPENDENT_AMBULATORY_CARE_PROVIDER_SITE_OTHER): Payer: Medicaid Other | Admitting: Urology

## 2016-06-07 DIAGNOSIS — N1330 Unspecified hydronephrosis: Secondary | ICD-10-CM | POA: Diagnosis not present

## 2016-06-07 DIAGNOSIS — Z85048 Personal history of other malignant neoplasm of rectum, rectosigmoid junction, and anus: Secondary | ICD-10-CM | POA: Diagnosis not present

## 2016-06-07 DIAGNOSIS — N360 Urethral fistula: Secondary | ICD-10-CM | POA: Diagnosis not present

## 2016-06-09 ENCOUNTER — Other Ambulatory Visit: Payer: Self-pay | Admitting: Urology

## 2016-06-09 DIAGNOSIS — N131 Hydronephrosis with ureteral stricture, not elsewhere classified: Secondary | ICD-10-CM

## 2016-06-14 ENCOUNTER — Other Ambulatory Visit: Payer: Self-pay | Admitting: Radiology

## 2016-06-15 ENCOUNTER — Ambulatory Visit (HOSPITAL_COMMUNITY)
Admission: RE | Admit: 2016-06-15 | Discharge: 2016-06-15 | Disposition: A | Discharge status: Home / Self Care | Payer: Medicaid Other | Source: Ambulatory Visit | Attending: Urology | Admitting: Urology

## 2016-06-15 ENCOUNTER — Other Ambulatory Visit: Payer: Self-pay | Admitting: Radiology

## 2016-06-15 ENCOUNTER — Ambulatory Visit (HOSPITAL_COMMUNITY): Admission: RE | Admit: 2016-06-15 | Payer: Medicaid Other | Source: Ambulatory Visit

## 2016-06-15 DIAGNOSIS — N131 Hydronephrosis with ureteral stricture, not elsewhere classified: Secondary | ICD-10-CM

## 2016-06-16 ENCOUNTER — Encounter (HOSPITAL_COMMUNITY): Payer: Self-pay | Admitting: Interventional Radiology

## 2016-06-16 ENCOUNTER — Other Ambulatory Visit: Payer: Self-pay | Admitting: Radiology

## 2016-06-16 ENCOUNTER — Other Ambulatory Visit: Payer: Self-pay | Admitting: Urology

## 2016-06-16 ENCOUNTER — Ambulatory Visit (HOSPITAL_COMMUNITY)
Admission: RE | Admit: 2016-06-16 | Discharge: 2016-06-16 | Disposition: A | Discharge status: Home / Self Care | Payer: Medicaid Other | Source: Ambulatory Visit | Attending: Urology | Admitting: Urology

## 2016-06-16 DIAGNOSIS — Z436 Encounter for attention to other artificial openings of urinary tract: Secondary | ICD-10-CM | POA: Insufficient documentation

## 2016-06-16 DIAGNOSIS — N131 Hydronephrosis with ureteral stricture, not elsewhere classified: Secondary | ICD-10-CM

## 2016-06-16 DIAGNOSIS — Z85048 Personal history of other malignant neoplasm of rectum, rectosigmoid junction, and anus: Secondary | ICD-10-CM | POA: Diagnosis not present

## 2016-06-16 HISTORY — PX: IR GENERIC HISTORICAL: IMG1180011

## 2016-06-16 MED ORDER — LIDOCAINE VISCOUS 2 % MT SOLN
OROMUCOSAL | Status: AC
  Filled 2016-06-16: qty 15

## 2016-06-16 MED ORDER — LIDOCAINE HCL (PF) 2 % IJ SOLN
INTRAMUSCULAR | Status: AC
  Filled 2016-06-16: qty 10

## 2016-06-16 MED ORDER — IOPAMIDOL (ISOVUE-300) INJECTION 61%
INTRAVENOUS | Status: AC
  Administered 2016-06-16: 50 mL
  Filled 2016-06-16: qty 50

## 2016-09-16 ENCOUNTER — Encounter (HOSPITAL_COMMUNITY): Payer: Self-pay | Admitting: Oncology

## 2016-09-30 ENCOUNTER — Encounter (HOSPITAL_COMMUNITY): Payer: Self-pay

## 2016-09-30 ENCOUNTER — Observation Stay (HOSPITAL_COMMUNITY)
Admission: EM | Admit: 2016-09-30 | Discharge: 2016-10-01 | Disposition: A | Discharge status: Home / Self Care | Payer: Medicaid Other | Attending: Internal Medicine | Admitting: Internal Medicine

## 2016-09-30 ENCOUNTER — Emergency Department (HOSPITAL_COMMUNITY): Payer: Medicaid Other

## 2016-09-30 DIAGNOSIS — K611 Rectal abscess: Principal | ICD-10-CM | POA: Insufficient documentation

## 2016-09-30 DIAGNOSIS — C218 Malignant neoplasm of overlapping sites of rectum, anus and anal canal: Secondary | ICD-10-CM | POA: Diagnosis not present

## 2016-09-30 DIAGNOSIS — E876 Hypokalemia: Secondary | ICD-10-CM | POA: Diagnosis present

## 2016-09-30 DIAGNOSIS — F17210 Nicotine dependence, cigarettes, uncomplicated: Secondary | ICD-10-CM | POA: Insufficient documentation

## 2016-09-30 DIAGNOSIS — C7800 Secondary malignant neoplasm of unspecified lung: Secondary | ICD-10-CM

## 2016-09-30 DIAGNOSIS — C20 Malignant neoplasm of rectum: Secondary | ICD-10-CM | POA: Diagnosis present

## 2016-09-30 DIAGNOSIS — L0231 Cutaneous abscess of buttock: Secondary | ICD-10-CM | POA: Diagnosis not present

## 2016-09-30 DIAGNOSIS — K651 Peritoneal abscess: Secondary | ICD-10-CM

## 2016-09-30 LAB — COMPREHENSIVE METABOLIC PANEL
ALBUMIN: 2.3 g/dL — AB (ref 3.5–5.0)
ALK PHOS: 254 U/L — AB (ref 38–126)
ALT: 39 U/L (ref 17–63)
AST: 52 U/L — AB (ref 15–41)
Anion gap: 10 (ref 5–15)
BILIRUBIN TOTAL: 0.8 mg/dL (ref 0.3–1.2)
BUN: 8 mg/dL (ref 6–20)
CALCIUM: 8.1 mg/dL — AB (ref 8.9–10.3)
CO2: 29 mmol/L (ref 22–32)
Chloride: 95 mmol/L — ABNORMAL LOW (ref 101–111)
Creatinine, Ser: 1 mg/dL (ref 0.61–1.24)
GFR calc Af Amer: >60 mL/min (ref >60)
GLUCOSE: 169 mg/dL — AB (ref 65–99)
Potassium: 3 mmol/L — ABNORMAL LOW (ref 3.5–5.1)
Sodium: 134 mmol/L — ABNORMAL LOW (ref 135–145)
TOTAL PROTEIN: 7 g/dL (ref 6.5–8.1)

## 2016-09-30 LAB — CBC WITH DIFFERENTIAL/PLATELET
Basophils Absolute: 0 K/uL (ref 0.0–0.1)
Basophils Relative: 0 %
Eosinophils Absolute: 0.4 K/uL (ref 0.0–0.7)
Eosinophils Relative: 3 %
HCT: 36.9 % — ABNORMAL LOW (ref 39.0–52.0)
Hemoglobin: 12.3 g/dL — ABNORMAL LOW (ref 13.0–17.0)
Lymphocytes Relative: 7 %
Lymphs Abs: 1 K/uL (ref 0.7–4.0)
MCH: 27.9 pg (ref 26.0–34.0)
MCHC: 33.3 g/dL (ref 30.0–36.0)
MCV: 83.7 fL (ref 78.0–100.0)
Monocytes Absolute: 1.6 K/uL — ABNORMAL HIGH (ref 0.1–1.0)
Monocytes Relative: 11 %
Neutro Abs: 11.7 K/uL — ABNORMAL HIGH (ref 1.7–7.7)
Neutrophils Relative %: 79 %
Platelets: 402 K/uL — ABNORMAL HIGH (ref 150–400)
RBC: 4.41 MIL/uL (ref 4.22–5.81)
RDW: 16.3 % — ABNORMAL HIGH (ref 11.5–15.5)
WBC: 14.7 K/uL — ABNORMAL HIGH (ref 4.0–10.5)

## 2016-09-30 LAB — LACTIC ACID, PLASMA
Lactic Acid, Venous: 1.1 mmol/L (ref 0.5–1.9)
Lactic Acid, Venous: 1.1 mmol/L (ref 0.5–1.9)

## 2016-09-30 MED ORDER — PREGABALIN 75 MG PO CAPS
150.0000 mg | ORAL_CAPSULE | Freq: Two times a day (BID) | ORAL | Status: DC
  Administered 2016-09-30 – 2016-10-01 (×3): 150 mg via ORAL
  Filled 2016-09-30 (×3): qty 2

## 2016-09-30 MED ORDER — ALBUTEROL SULFATE (2.5 MG/3ML) 0.083% IN NEBU: 3.0000 mL | INHALATION_SOLUTION | Freq: Four times a day (QID) | RESPIRATORY_TRACT | Status: DC | PRN

## 2016-09-30 MED ORDER — POTASSIUM CHLORIDE 10 MEQ/100ML IV SOLN
10.0000 meq | INTRAVENOUS | Status: AC
  Administered 2016-09-30 (×3): 10 meq via INTRAVENOUS
  Filled 2016-09-30: qty 100

## 2016-09-30 MED ORDER — VANCOMYCIN HCL IN DEXTROSE 1-5 GM/200ML-% IV SOLN
1000.0000 mg | Freq: Once | INTRAVENOUS | Status: AC
  Administered 2016-09-30: 1000 mg via INTRAVENOUS
  Filled 2016-09-30: qty 200

## 2016-09-30 MED ORDER — ONDANSETRON HCL 4 MG/2ML IJ SOLN: 4.0000 mg | Freq: Four times a day (QID) | INTRAMUSCULAR | Status: DC | PRN

## 2016-09-30 MED ORDER — HYDROMORPHONE HCL 1 MG/ML IJ SOLN
1.0000 mg | INTRAMUSCULAR | Status: DC | PRN
  Administered 2016-09-30 – 2016-10-01 (×4): 1 mg via INTRAVENOUS
  Filled 2016-09-30 (×4): qty 1

## 2016-09-30 MED ORDER — DULOXETINE HCL 60 MG PO CPEP
60.0000 mg | ORAL_CAPSULE | Freq: Every day | ORAL | Status: DC
  Administered 2016-09-30 – 2016-10-01 (×2): 60 mg via ORAL
  Filled 2016-09-30 (×2): qty 1

## 2016-09-30 MED ORDER — HYDROMORPHONE HCL 1 MG/ML IJ SOLN
1.0000 mg | Freq: Once | INTRAMUSCULAR | Status: AC
  Administered 2016-09-30: 1 mg via INTRAVENOUS
  Filled 2016-09-30: qty 1

## 2016-09-30 MED ORDER — BUPIVACAINE HCL (PF) 0.5 % IJ SOLN
10.0000 mL | Freq: Once | INTRAMUSCULAR | Status: AC
  Administered 2016-09-30: 10 mL
  Filled 2016-09-30: qty 30

## 2016-09-30 MED ORDER — ZOLPIDEM TARTRATE 5 MG PO TABS
5.0000 mg | ORAL_TABLET | Freq: Every evening | ORAL | Status: DC | PRN
  Administered 2016-09-30: 5 mg via ORAL
  Filled 2016-09-30: qty 1

## 2016-09-30 MED ORDER — VANCOMYCIN HCL 10 G IV SOLR
1250.0000 mg | Freq: Two times a day (BID) | INTRAVENOUS | Status: DC
  Administered 2016-09-30 – 2016-10-01 (×2): 1250 mg via INTRAVENOUS
  Filled 2016-09-30 (×7): qty 1250

## 2016-09-30 MED ORDER — SODIUM CHLORIDE 0.9 % IV SOLN
INTRAVENOUS | Status: DC
  Administered 2016-09-30: 09:00:00 via INTRAVENOUS

## 2016-09-30 MED ORDER — IOPAMIDOL (ISOVUE-300) INJECTION 61%
100.0000 mL | Freq: Once | INTRAVENOUS | Status: AC | PRN
  Administered 2016-09-30: 100 mL via INTRAVENOUS

## 2016-09-30 MED ORDER — ACETAMINOPHEN 325 MG PO TABS: 650.0000 mg | ORAL_TABLET | Freq: Four times a day (QID) | ORAL | Status: DC | PRN

## 2016-09-30 MED ORDER — AMITRIPTYLINE HCL 25 MG PO TABS
50.0000 mg | ORAL_TABLET | Freq: Every day | ORAL | Status: DC
  Administered 2016-09-30: 50 mg via ORAL
  Filled 2016-09-30: qty 2

## 2016-09-30 MED ORDER — PIPERACILLIN-TAZOBACTAM 3.375 G IVPB
3.3750 g | Freq: Three times a day (TID) | INTRAVENOUS | Status: DC
  Administered 2016-09-30 – 2016-10-01 (×4): 3.375 g via INTRAVENOUS
  Filled 2016-09-30 (×4): qty 50

## 2016-09-30 MED ORDER — SODIUM CHLORIDE 0.9 % IV BOLUS (SEPSIS)
1000.0000 mL | Freq: Once | INTRAVENOUS | Status: AC
  Administered 2016-09-30: 1000 mL via INTRAVENOUS

## 2016-09-30 MED ORDER — DOCUSATE SODIUM 100 MG PO CAPS: 100.0000 mg | ORAL_CAPSULE | Freq: Every day | ORAL | Status: DC | PRN

## 2016-09-30 MED ORDER — ENOXAPARIN SODIUM 40 MG/0.4ML ~~LOC~~ SOLN
40.0000 mg | SUBCUTANEOUS | Status: DC
  Administered 2016-09-30: 40 mg via SUBCUTANEOUS
  Filled 2016-09-30 (×2): qty 0.4

## 2016-09-30 MED ORDER — ONDANSETRON HCL 4 MG PO TABS: 4.0000 mg | ORAL_TABLET | Freq: Four times a day (QID) | ORAL | Status: DC | PRN

## 2016-09-30 NOTE — ED Provider Notes (Signed)
[facility] DEPT Provider Note   CSN: [id number] Arrival date & time: 09/30/16  0146  Time seen 02:45 AM   History   Chief Complaint Chief Complaint  Patient presents with  . Abscess    HPI Andrew Kline is a 51 y.o. male.  HPI patient reports one week ago he had a small red bump over his buttock area. He states over the next couple days it was still flat however it was starting to get painful. He reports that 4 days ago it started having a indentation in the center and started getting more painful. He states yesterday he was using a cane because he was having diffuse joint aches but was unaware of having fever or chills. Yesterday he states the area started getting more red more swollen and more painful. Last night he reports a lot of diaphoresis but no nausea or vomiting. He states he's never had an abscess or boil in the past.  Patient states he has stage IV rectal cancer and he has lung metastases. He is in hospice care. He is on oxygen 3-4 L at nighttime and occasionally during the day. He states normally the nebulizers and inhalers do not help with his shortness of breath.   PCP Marco Collie with hospice  Patient is DO NOT RESUSCITATE  Past Medical History:  Diagnosis Date  . Anxiety   . Arthritis   . Depression   . Hospice care   . Kidney stone 03/22/15   left  . Rectal cancer ([place])   . Rectal pain   . Renal cell cancer ([place])    2012.   [name] Shortness of breath dyspnea     Patient Active Problem List   Diagnosis Date Noted  . Left buttock abscess 09/30/2016  . Urinary retention 03/28/2016  . Suprapubic catheter dysfunction ([place]) 03/28/2016  . Goals of care, counseling/discussion   . DNR (do not resuscitate) discussion   . Pyomyositis: Left gluteus maximus per CT 03/07/2016 03/08/2016  . Carcinoma of kidney ([place])   . Sepsis affecting skin ([place])   . Suprapubic catheter ([place])   . Uncontrolled pain   . Palliative care encounter   . Pelvic abscess in male [facility])     . Sepsis ([place]) 03/04/2016  . Renal cell carcinoma ([place]) 03/04/2016  . Hyponatremia 03/04/2016  . Complicated UTI (urinary tract infection) 01/25/2016  . Physical deconditioning 07/07/2015  . Muscular deconditioning 07/07/2015  . Malnutrition of moderate degree ([place]) 02/13/2015  . Rectal cancer metastasized to lung [facility]) 10/30/2014    Past Surgical History:  Procedure Laterality Date  . BIOPSY N/A 09/30/2014   Procedure: BIOPSY;  Surgeon: Danie Binder, MD;  Location: [facility] ORS;  Service: Endoscopy;  Laterality: N/A;  Anal Canal  . COLOSTOMY N/A 02/11/2015   Procedure: COLOSTOMY;  Surgeon: Aviva Signs, MD;  Location: [facility];  Service: General;  Laterality: N/A;  . FLEXIBLE SIGMOIDOSCOPY N/A 09/30/2014   Procedure: FLEXIBLE SIGMOIDOSCOPY;  Surgeon: Danie Binder, MD;  Location: [facility];  Service: Endoscopy;  Laterality: N/A;  . FLEXIBLE SIGMOIDOSCOPY N/A 10/17/2014   Procedure: FLEXIBLE SIGMOIDOSCOPY;  Surgeon: Danie Binder, MD;  Location: [facility] ENDO SUITE;  Service: Endoscopy;  Laterality: N/A;  1325  . HYPOSPADIAS CORRECTION    . IR GENERIC HISTORICAL  12/17/2015   IR CATHETER TUBE CHANGE 12/17/2015 [facility]INTER[facility]  . IR GENERIC HISTORICAL  01/27/2016   IR NEPHROSTOMY PLACEMENT RIGHT 01/27/2016 [facility]  . IR GENERIC HISTORICAL  01/27/2016   IR NEPHROSTOMY PLACEMENT  LEFT 01/27/2016 [facility]  . IR GENERIC HISTORICAL  03/08/2016   IR NEPHROSTOMY EXCHANGE LEFT 03/08/2016 Greggory Keen, MD [facility]  . IR GENERIC HISTORICAL  03/08/2016   IR NEPHROSTOMY EXCHANGE RIGHT 03/08/2016 Greggory Keen, MD [facility]  . IR GENERIC HISTORICAL  03/07/2016   IR CATHETER TUBE CHANGE 03/07/2016 [facility]  . IR GENERIC HISTORICAL  03/28/2016   IR CATHETER TUBE CHANGE 03/28/2016 Corrie Mckusick, DO [facility]  . IR GENERIC HISTORICAL  03/28/2016   IR NEPHROSTOMY TUBE CHANGE 03/28/2016 Corrie Mckusick, DO [facility]  . IR GENERIC HISTORICAL  06/16/2016   IR CATHETER TUBE CHANGE 06/16/2016 Aletta Edouard, MD [facility]  . IR GENERIC HISTORICAL  06/16/2016   IR NEPHROSTOGRAM LEFT THRU EXISTING ACCESS 06/16/2016 Aletta Edouard, MD [facility]  . KNEE SURGERY Left    arthroscopy  . LAPAROSCOPIC PARTIAL NEPHRECTOMY  2012   right side  . LUNG BIOPSY Right   . PERINEAL PROCTECTOMY N/A 08/26/2015   Procedure:  PROCTECTOMY;  Surgeon: Aviva Signs, MD;  Location: [facility];  Service: General;  Laterality: N/A;  . RECTAL SURGERY         Home Medications    Prior to Admission medications   Medication Sig Start Date End Date Taking? Authorizing Provider  acetaminophen (TYLENOL) 325 MG tablet Take 2 tablets (650 mg total) by mouth every 6 (six) hours as needed for mild pain (or Fever >/= 101). 03/16/16   Eber Jones, MD  albuterol (PROVENTIL HFA;VENTOLIN HFA) 108 [phone] Base) MCG/ACT inhaler Inhale 2 puffs into the lungs every 6 (six) hours as needed for wheezing or shortness of breath. 05/26/15   Penland, Kelby Fam, MD  amitriptyline (ELAVIL) 50 MG tablet TAKE ONE TABLET BY MOUTH AT BEDTIME. 05/10/16   Penland, Kelby Fam, MD  docusate sodium (COLACE) 100 MG capsule Take 1 capsule (100 mg total) by mouth every 12 (twelve) hours. Patient taking differently: Take 100 mg by mouth daily as needed for mild constipation or moderate constipation.  09/25/14   Lawyer, Harrell Gave, PA-C  DULoxetine (CYMBALTA) 60 MG capsule Take 1 capsule (60 mg total) by mouth daily. 12/31/15   Penland, Kelby Fam, MD  fluconazole (DIFLUCAN) 100 MG tablet Take 1 tablet (100 mg total) by mouth daily. 03/30/16   Geradine Girt, DO  HYDROmorphone (DILAUDID) 2 MG tablet Take 1-2 tablets (2-4 mg total) by mouth every 4 (four) hours as needed for severe pain. 03/29/16   Geradine Girt, DO  ondansetron (ZOFRAN) 4 MG tablet Take 1 tablet (4 mg total) by mouth every 6 (six) hours as needed for nausea. 03/16/16   Eber Jones, MD  pregabalin (LYRICA) 75 MG capsule Take 2 capsules (150 mg total) by mouth 2 (two) times  daily. 12/31/15   Penland, Kelby Fam, MD  zolpidem (AMBIEN CR) 12.5 MG CR tablet Take 12.5 mg by mouth at bedtime as needed for sleep.    [provider]    Family History Family History  Problem Relation Age of Onset  . Healthy Mother   . Lung cancer Maternal Grandfather   . Colon cancer Maternal Uncle        age 79  . Diabetes Other     Social History Social History  Substance Use Topics  . Smoking status: Current Every Day Smoker    Packs/day: 0.50    Years: 30.00    Types: Cigarettes  . Smokeless tobacco: Never Used  . Alcohol use No  Comment: Occ  lives at home Lives with spouse   Allergies   Oxaliplatin   Review of Systems Review of Systems  All other systems reviewed and are negative.    Physical Exam Updated Vital Signs BP (!) 104/57   Pulse 88   Temp (!) 100.9 F (38.3 C) (Oral)   Resp 19   Ht 5\' 5"  (1.651 m)   Wt 90.7 kg (200 lb)   SpO2 94%   BMI 33.28 kg/m   Vital signs normal except for fever   Physical Exam  Constitutional: He is oriented to person, place, and time. He appears well-developed and well-nourished.  HENT:  Head: Normocephalic and atraumatic.  Right Ear: External ear normal.  Left Ear: External ear normal.  Eyes: Conjunctivae and EOM are normal.  Neck: Normal range of motion.  Cardiovascular: Normal rate.   Abdominal:  He has a large hernia in his LLQ with a colostomy  Musculoskeletal: Normal range of motion.  Neurological: He is alert and oriented to person, place, and time. No cranial nerve deficit.  Skin: Skin is warm. He is diaphoretic. There is erythema.  Patient has a very large red indurated area that encompasses most of his left buttock superiorly with some mild redness on the medial aspect of his right buttock. There is a area near the midline that has see appearance of a scabbed area that is draining clear fluid.     ED Treatments / Results  Labs (all labs ordered are listed, but only abnormal  results are displayed) Results for orders placed or performed during the hospital encounter of 09/30/16  Culture, blood (routine x 2)  Result Value Ref Range   Specimen Description BLOOD RIGHT ARM    Special Requests      BOTTLES DRAWN AEROBIC AND ANAEROBIC Blood Culture adequate volume   Culture PENDING    Report Status PENDING   Culture, blood (routine x 2)  Result Value Ref Range   Specimen Description BLOOD RIGHT HAND    Special Requests      BOTTLES DRAWN AEROBIC AND ANAEROBIC Blood Culture adequate volume   Culture PENDING    Report Status PENDING   Comprehensive metabolic panel  Result Value Ref Range   Sodium 134 (L) 135 - 145 mmol/L   Potassium 3.0 (L) 3.5 - 5.1 mmol/L   Chloride 95 (L) 101 - 111 mmol/L   CO2 29 22 - 32 mmol/L   Glucose, Bld 169 (H) 65 - 99 mg/dL   BUN 8 6 - 20 mg/dL   Creatinine, Ser 1.00 0.61 - 1.24 mg/dL   Calcium 8.1 (L) 8.9 - 10.3 mg/dL   Total Protein 7.0 6.5 - 8.1 g/dL   Albumin 2.3 (L) 3.5 - 5.0 g/dL   AST 52 (H) 15 - 41 U/L   ALT 39 17 - 63 U/L   Alkaline Phosphatase 254 (H) 38 - 126 U/L   Total Bilirubin 0.8 0.3 - 1.2 mg/dL   GFR calc non Af Amer >60 >60 mL/min   GFR calc Af Amer >60 >60 mL/min   Anion gap 10 5 - 15  CBC with Differential  Result Value Ref Range   WBC 14.7 (H) 4.0 - 10.5 K/uL   RBC 4.41 4.22 - 5.81 MIL/uL   Hemoglobin 12.3 (L) 13.0 - 17.0 g/dL   HCT 36.9 (L) 39.0 - 52.0 %   MCV 83.7 78.0 - 100.0 fL   MCH 27.9 26.0 - 34.0 pg   MCHC 33.3 30.0 - 36.0  g/dL   RDW 16.3 (H) 11.5 - 15.5 %   Platelets 402 (H) 150 - 400 K/uL   Neutrophils Relative % 79 %   Neutro Abs 11.7 (H) 1.7 - 7.7 K/uL   Lymphocytes Relative 7 %   Lymphs Abs 1.0 0.7 - 4.0 K/uL   Monocytes Relative 11 %   Monocytes Absolute 1.6 (H) 0.1 - 1.0 K/uL   Eosinophils Relative 3 %   Eosinophils Absolute 0.4 0.0 - 0.7 K/uL   Basophils Relative 0 %   Basophils Absolute 0.0 0.0 - 0.1 K/uL  Lactic acid, plasma  Result Value Ref Range   Lactic Acid, Venous  1.1 0.5 - 1.9 mmol/L  Lactic acid, plasma  Result Value Ref Range   Lactic Acid, Venous 1.1 0.5 - 1.9 mmol/L   Laboratory interpretation all normal except Leukocytosis, mild anemia, mild hyponatremia and hypokalemia, hyperglycemia, elevation of some of his LFTs    EKG  EKG Interpretation None       Radiology Ct Pelvis W Contrast  Result Date: 09/30/2016 CLINICAL DATA:  Draining wound at the coccyx, acute onset. EXAM: CT PELVIS WITH CONTRAST TECHNIQUE: Multidetector CT imaging of the pelvis was performed using the standard protocol following the bolus administration of intravenous contrast. CONTRAST:  155mL ISOVUE-300 IOPAMIDOL (ISOVUE-300) INJECTION 61% COMPARISON:  CT of the abdomen and pelvis performed 05/03/2016 FINDINGS: Urinary Tract: The bladder is decompressed, with a suprapubic catheter. Diffuse surrounding soft tissue inflammation is noted. Soft tissue inflammation tracks about the distal ureters bilaterally. Bowel: Visualized small and large bowel loops are grossly unremarkable, aside from herniation of multiple small bowel loops into the patient's left lower quadrant colostomy site. There is no evidence for bowel obstruction. Vascular/Lymphatic: Scattered calcification is noted at the aortic bifurcation and its branches. No retroperitoneal or pelvic sidewall lymphadenopathy is seen. Reproductive:  The prostate is not definitely characterized. Other: There has been significant interval increase in the size of the patient's lower pelvic abscess, measuring 8.7 x 5.9 x 3.5 cm and tracking inferiorly about the left side of the anorectal canal. Extensive surrounding phlegmon and soft tissue inflammation are seen. Soft tissue edema tracks along the medial left gluteus muscle, with a 6 cm long tract from the abscess to the medial left buttock skin surface. Mild soft tissue inflammation and skin thickening are noted tracking along the left buttocks, compatible with cellulitis. Musculoskeletal: No  acute osseous abnormalities are identified. The visualized musculature is unremarkable in appearance. IMPRESSION: 1. Significant interval increase in size of patient's lower pelvic abscess, now measuring 8.7 x 5.9 x 3.5 cm and tracking inferiorly about the left side of the anorectal canal. Extensive surrounding phlegmon and soft tissue inflammation noted. Soft tissue edema tracks along the medial left gluteus muscle, with a 6 cm long tract from the abscess to the medial left buttock surface. 2. Mild soft tissue inflammation and skin thickening along the left buttocks, compatible with cellulitis. 3. Diffuse soft tissue inflammation noted about the bladder, and along the distal ureters bilaterally. 4. Herniation of multiple small bowel loops into the patient's left lower quadrant colostomy site. No evidence of bowel obstruction. 5. Scattered aortic atherosclerosis. Electronically Signed   By: Garald Balding M.D.   On: 09/30/2016 05:43    Procedures .Marland KitchenIncision and Drainage Date/Time: 09/30/2016 5:14 AM Performed by: Tomi Bamberger, Emonni Depasquale Authorized by: Rolland Porter   Consent:    Consent obtained:  Verbal   Consent given by:  Patient and spouse   Risks discussed:  Bleeding and incomplete  drainage   Alternatives discussed:  No treatment Location:    Type:  Abscess   Location: Left buttock. Pre-procedure details:    Skin preparation:  Betadine Anesthesia (see MAR for exact dosages):    Anesthesia method:  Local infiltration   Local anesthetic:  Bupivacaine 0.5% w/o epi Procedure type:    Complexity:  Simple Procedure details:    Needle aspiration: no     Incision types:  Stab incision   Incision depth:  Submucosal   Scalpel blade:  11   Wound management:  Probed and deloculated   Drainage:  Bloody   Drainage amount:  Scant   Wound treatment:  Wound left open   Packing materials:  1/4 in gauze Post-procedure details:    Patient tolerance of procedure:  Tolerated well, no immediate complications    (including critical care time)  Medications Ordered in ED Medications  sodium chloride 0.9 % bolus 1,000 mL (1,000 mLs Intravenous New Bag/Given 09/30/16 0251)  sodium chloride 0.9 % bolus 1,000 mL (1,000 mLs Intravenous New Bag/Given 09/30/16 0251)  HYDROmorphone (DILAUDID) injection 1 mg (1 mg Intravenous Given 09/30/16 0311)  bupivacaine (MARCAINE) 0.5 % injection 10 mL (10 mLs Infiltration Given 09/30/16 0311)  vancomycin (VANCOCIN) IVPB 1000 mg/200 mL premix (0 mg Intravenous Stopped 09/30/16 0428)  iopamidol (ISOVUE-300) 61 % injection 100 mL (100 mLs Intravenous Contrast Given 09/30/16 0512)     Initial Impression / Assessment and Plan / ED Course  I have reviewed the triage vital signs and the nursing notes.  Pertinent labs & imaging results that were available during my care of the patient were reviewed by me and considered in my medical decision making (see chart for details).  Patient was given IV pain medication. He was started on IV vancomycin.  After attempting to drain this large abscess I did not get any significant drainage back although he has soaked through twice a stack of 4 x 4's and the dressing. CT scan the pelvis was ordered to see if there is any other underlying pockets that were missed.  4:53 AM Dr. Darrick Meigs, hospitalist, states to call back once the CT scan is done, will need surgery consult.  06:00 AM Dr Darrick Meigs has admitted the patient.   Looking back at patient's charts he has had known pelvic abscesses since last year. Interestingly both patient and his wife told me he's never had them before. He had been followed by infectious disease at that time.   Final Clinical Impressions(s) / ED Diagnoses   Final diagnoses:  Perirectal abscess  Pelvic abscess in male [facility])    Plan admission  Rolland Porter, MD, Barbette Or, MD 09/30/16 [phone]

## 2016-09-30 NOTE — ED Notes (Addendum)
Pt states he uses O2 @ 4lpm at night - pt O2 sat was 88% on Room Air - placed on Nasal Cannula O2 @ 2lpm, pulse oximetry up to 93%. Pt states 89% is a normal oxygen saturation for him.

## 2016-09-30 NOTE — H&P (Signed)
TRH H&P    Patient Demographics:    Andrew Kline, is a 51 y.o. male  MRN: [id number]  DOB - [birth year]  Admit Date - 09/30/2016  Referring MD/NP/PA: Dr Tomi Bamberger  Outpatient Primary MD for the patient is Penland, Kelby Fam, MD  Patient coming from: Home  Chief Complaint  Patient presents with  . Abscess      HPI:    Andrew Kline  is a 51 y.o. 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 Patient says that bun called bigger and started having indentation in center and more painful. Last night patient had diaphoresis and abscess ruptured with bleeding.  Complains of pain 10/10 in intensity.  Patient denies nausea vomiting or diarrhea. He denies chest pain or shortness of breath. Denies passing out  In the ED, CT scan of the pelvis with contrast was done which showed significant interval increase in size of patient's lower pelvic abscess measuring [phone] cm and tracking inferiorly along the left side of the anorectal canal.   Review of systems:      A full 10 point Review of Systems was done, except as stated above, all other Review of Systems were negative.   With Past History of the following :    Past Medical History:  Diagnosis Date  . Anxiety   . Arthritis   . Depression   . Hospice care   . Kidney stone 03/22/15   left  . Rectal cancer ([place])   . Rectal pain   . Renal cell cancer ([place])    2012.   [name] Shortness of breath dyspnea       Past Surgical History:  Procedure Laterality Date  . BIOPSY N/A 09/30/2014   Procedure: BIOPSY;  Surgeon: Danie Binder, MD;  Location: [facility];  Service: Endoscopy;  Laterality: N/A;  Anal Canal  . COLOSTOMY N/A 02/11/2015   Procedure: COLOSTOMY;  Surgeon: Aviva Signs, MD;  Location: [facility];  Service: General;  Laterality: N/A;  .  FLEXIBLE SIGMOIDOSCOPY N/A 09/30/2014   Procedure: FLEXIBLE SIGMOIDOSCOPY;  Surgeon: Danie Binder, MD;  Location: [facility];  Service: Endoscopy;  Laterality: N/A;  . FLEXIBLE SIGMOIDOSCOPY N/A 10/17/2014   Procedure: FLEXIBLE SIGMOIDOSCOPY;  Surgeon: Danie Binder, MD;  Location: [facility];  Service: Endoscopy;  Laterality: N/A;  1325  . HYPOSPADIAS CORRECTION    . IR GENERIC HISTORICAL  12/17/2015   IR CATHETER TUBE CHANGE 12/17/2015 [facility]  . IR GENERIC HISTORICAL  01/27/2016   IR NEPHROSTOMY PLACEMENT RIGHT 01/27/2016 [facility]  . IR GENERIC HISTORICAL  01/27/2016   IR NEPHROSTOMY PLACEMENT LEFT 01/27/2016 [facility]  . IR GENERIC HISTORICAL  03/08/2016   IR NEPHROSTOMY EXCHANGE LEFT 03/08/2016 Greggory Keen, MD [facility]  . IR GENERIC HISTORICAL  03/08/2016   IR NEPHROSTOMY EXCHANGE RIGHT 03/08/2016 Greggory Keen, MD [facility]  . IR GENERIC HISTORICAL  03/07/2016   IR CATHETER TUBE CHANGE 03/07/2016 [facility]  . IR GENERIC HISTORICAL  03/28/2016   IR CATHETER TUBE CHANGE 03/28/2016 Corrie Mckusick, DO [facility]  . IR GENERIC HISTORICAL  03/28/2016   IR NEPHROSTOMY TUBE CHANGE 03/28/2016 Corrie Mckusick, DO [facility]  . IR GENERIC HISTORICAL  06/16/2016   IR CATHETER TUBE CHANGE 06/16/2016 Aletta Edouard, MD [facility]  . IR GENERIC HISTORICAL  06/16/2016   IR NEPHROSTOGRAM LEFT THRU EXISTING ACCESS 06/16/2016 Aletta Edouard, MD [facility]  . KNEE SURGERY Left    arthroscopy  . LAPAROSCOPIC PARTIAL NEPHRECTOMY  2012   right side  . LUNG BIOPSY Right   . PERINEAL PROCTECTOMY N/A 08/26/2015   Procedure:  PROCTECTOMY;  Surgeon: Aviva Signs, MD;  Location: [facility];  Service: General;  Laterality: N/A;  . RECTAL SURGERY        Social History:      Social History  Substance Use Topics  . Smoking status: Current Every Day Smoker    Packs/day: 0.50    Years: 30.00    Types: Cigarettes  . Smokeless tobacco: Never Used  . Alcohol use No     Comment: Occ        Family History :     Family History  Problem Relation Age of Onset  . Healthy Mother   . Lung cancer Maternal Grandfather   . Colon cancer Maternal Uncle        age 21  . Diabetes Other       Home Medications:   Prior to Admission medications   Medication Sig Start Date End Date Taking? Authorizing Provider  acetaminophen (TYLENOL) 325 MG tablet Take 2 tablets (650 mg total) by mouth every 6 (six) hours as needed for mild pain (or Fever >/= 101). 03/16/16   Eber Jones, MD  albuterol (PROVENTIL HFA;VENTOLIN HFA) [phone] Base) MCG/ACT inhaler Inhale 2 puffs into the lungs every 6 (six) hours as needed for wheezing or shortness of breath. 05/26/15   Penland, Kelby Fam, MD  amitriptyline (ELAVIL) 50 MG tablet TAKE ONE TABLET BY MOUTH AT BEDTIME. 05/10/16   Penland, Kelby Fam, MD  docusate sodium (COLACE) 100 MG capsule Take 1 capsule (100 mg total) by mouth every 12 (twelve) hours. Patient taking differently: Take 100 mg by mouth daily as needed for mild constipation or moderate constipation.  09/25/14   Lawyer, Harrell Gave, PA-C  DULoxetine (CYMBALTA) 60 MG capsule Take 1 capsule (60 mg total) by mouth daily. 12/31/15   Penland, Kelby Fam, MD  fluconazole (DIFLUCAN) 100 MG tablet Take 1 tablet (100 mg total) by mouth daily. 03/30/16   Geradine Girt, DO  HYDROmorphone (DILAUDID) 2 MG tablet Take 1-2 tablets (2-4 mg total) by mouth every 4 (four) hours as needed for severe pain. 03/29/16   Geradine Girt, DO  ondansetron (ZOFRAN) 4 MG tablet Take 1 tablet (4 mg total) by mouth every 6 (six) hours as needed for nausea. 03/16/16   Eber Jones, MD  pregabalin (LYRICA) 75 MG capsule Take 2 capsules (150 mg total) by mouth 2 (two) times daily. 12/31/15   Penland, Kelby Fam, MD  zolpidem (AMBIEN CR) 12.5 MG CR tablet Take 12.5 mg by mouth at bedtime as needed for sleep.    [provider]     Allergies:     Allergies  Allergen Reactions  . Oxaliplatin Itching      Physical Exam:   Vitals  Blood pressure (!) 94/53, pulse 79, temperature (!) 100.9 F (38.3 C), temperature source  Oral, resp. rate 19, height 5\' 5"  (1.651 m), weight 90.7 kg (200 lb), SpO2 96 %.  1.  GenAppears in moderate distress  2. Psychiatric:  Intact judgement and  insight, awake alert, oriented x 3.  3. Neurologic: No focal neurological deficits, all cranial nerves intact.Strength 5/5 all 4 extremities, sensation intact all 4 extremities, plantars down going.  4. Eyes :  anicteric sclerae, moist conjunctivae with no lid lag. PERRLA.  5. ENMT:  Oropharynx clear with moist mucous membranes and good dentition  6. Neck:  supple, no cervical lymphadenopathy appriciated, No thyromegaly  7. Respiratory : Normal respiratory effort, good air movement bilaterally,clear to  auscultation bilaterally  8. Cardiovascular : RRR, no gallops, rubs or murmurs, no leg edema  9. Gastrointestinal:  Positive bowel sounds, abdomen soft, non-tender to palpation,no hepatosplenomegaly, no rigidity or guarding       10. Skin:  large erythematous indurated area noted on left buttock, opening noted in the middle of the area with draining of clear fluid   11.Musculoskeletal:  Good muscle tone,  joints appear normal , no effusions,  normal range of motion    Data Review:    CBC  Recent Labs Lab 09/30/16 0245  WBC 14.7*  HGB 12.3*  HCT 36.9*  PLT 402*  MCV 83.7  MCH 27.9  MCHC 33.3  RDW 16.3*  LYMPHSABS 1.0  MONOABS 1.6*  EOSABS 0.4  BASOSABS 0.0   ------------------------------------------------------------------------------------------------------------------  Chemistries   Recent Labs Lab 09/30/16 0245  NA 134*  K 3.0*  CL 95*  CO2 29  GLUCOSE 169*  BUN 8  CREATININE 1.00  CALCIUM 8.1*  AST 52*  ALT 39  ALKPHOS 254*  BILITOT 0.8    ------------------------------------------------------------------------------------------------------------------  ------------------------------------------------------------------------------------------------------------------ GFR: Estimated Creatinine Clearance: 88.5 mL/min (by C-G formula based on SCr of 1 mg/dL). Liver Function Tests:  Recent Labs Lab 09/30/16 0245  AST 52*  ALT 39  ALKPHOS 254*  BILITOT 0.8  PROT 7.0  ALBUMIN 2.3*   No results for input(s): LIPASE, AMYLASE in the last 168 hours. No results for input(s): AMMONIA in the last 168 hours. Coagulation Profile: No results for input(s): INR, PROTIME in the last 168 hours. Cardiac Enzymes: No results for input(s): CKTOTAL, CKMB, CKMBINDEX, TROPONINI in the last 168 hours. BNP (last 3 results) No results for input(s): PROBNP in the last 8760 hours. HbA1C: No results for input(s): HGBA1C in the last 72 hours. CBG: No results for input(s): GLUCAP in the last 168 hours. Lipid Profile: No results for input(s): CHOL, HDL, LDLCALC, TRIG, CHOLHDL, LDLDIRECT in the last 72 hours. Thyroid Function Tests: No results for input(s): TSH, T4TOTAL, FREET4, T3FREE, THYROIDAB in the last 72 hours. Anemia Panel: No results for input(s): VITAMINB12, FOLATE, FERRITIN, TIBC, IRON, RETICCTPCT in the last 72 hours.  --------------------------------------------------------------------------------------------------------------- Urine analysis:    Component Value Date/Time   COLORURINE YELLOW 03/28/2016 0048   APPEARANCEUR HAZY (A) 03/28/2016 0048   LABSPEC 1.012 03/28/2016 0048   PHURINE 6.0 03/28/2016 0048   GLUCOSEU NEGATIVE 03/28/2016 0048   HGBUR MODERATE (A) 03/28/2016 0048   BILIRUBINUR NEGATIVE 03/28/2016 0048   KETONESUR NEGATIVE 03/28/2016 0048   PROTEINUR 100 (A) 03/28/2016 0048   UROBILINOGEN 0.2 03/17/2015 0910   NITRITE NEGATIVE 03/28/2016 0048   LEUKOCYTESUR MODERATE (A) 03/28/2016 0048      Imaging  Results:    Ct Pelvis W Contrast  Result Date: 09/30/2016 CLINICAL DATA:  Draining wound at the coccyx, acute onset. EXAM: CT PELVIS WITH CONTRAST TECHNIQUE: Multidetector  CT imaging of the pelvis was performed using the standard protocol following the bolus administration of intravenous contrast. CONTRAST:  118mL ISOVUE-300 IOPAMIDOL (ISOVUE-300) INJECTION 61% COMPARISON:  CT of the abdomen and pelvis performed 05/03/2016 FINDINGS: Urinary Tract: The bladder is decompressed, with a suprapubic catheter. Diffuse surrounding soft tissue inflammation is noted. Soft tissue inflammation tracks about the distal ureters bilaterally. Bowel: Visualized small and large bowel loops are grossly unremarkable, aside from herniation of multiple small bowel loops into the patient's left lower quadrant colostomy site. There is no evidence for bowel obstruction. Vascular/Lymphatic: Scattered calcification is noted at the aortic bifurcation and its branches. No retroperitoneal or pelvic sidewall lymphadenopathy is seen. Reproductive:  The prostate is not definitely characterized. Other: There has been significant interval increase in the size of the patient's lower pelvic abscess, measuring 8.7 x 5.9 x 3.5 cm and tracking inferiorly about the left side of the anorectal canal. Extensive surrounding phlegmon and soft tissue inflammation are seen. Soft tissue edema tracks along the medial left gluteus muscle, with a 6 cm long tract from the abscess to the medial left buttock skin surface. Mild soft tissue inflammation and skin thickening are noted tracking along the left buttocks, compatible with cellulitis. Musculoskeletal: No acute osseous abnormalities are identified. The visualized musculature is unremarkable in appearance. IMPRESSION: 1. Significant interval increase in size of patient's lower pelvic abscess, now measuring 8.7 x 5.9 x 3.5 cm and tracking inferiorly about the left side of the anorectal canal. Extensive surrounding  phlegmon and soft tissue inflammation noted. Soft tissue edema tracks along the medial left gluteus muscle, with a 6 cm long tract from the abscess to the medial left buttock surface. 2. Mild soft tissue inflammation and skin thickening along the left buttocks, compatible with cellulitis. 3. Diffuse soft tissue inflammation noted about the bladder, and along the distal ureters bilaterally. 4. Herniation of multiple small bowel loops into the patient's left lower quadrant colostomy site. No evidence of bowel obstruction. 5. Scattered aortic atherosclerosis. Electronically Signed   By: Garald Balding M.D.   On: 09/30/2016 05:43       Assessment & Plan:    Active Problems:   Left buttock abscess   1. Left buttock abscess/cellulitis- will start vancomycin and Zosyn. Consult general surgery in a.m. Follow blood culture results. Start Dilaudid 1 mg IV every 4 hours when necessary for pain 2. Hypokalemia- replace potassium IV KCl 10 meq x3. Follow BMP in a.m. 3. Stage IV cancer with lung metastases - patient is DO NOT RESUSCITATE and on  hospice.    DVT Prophylaxis-   Lovenox  AM Labs Ordered, also please review Full Orders  Family Communication: Admission, patients condition and plan of care including tests being ordered have been discussed with the patient  who indicate understanding and agree with the plan and Code Status.  Code Status: DO NOT RESUSCITATE  Admission status: observation   Time spent in minutes : 50 min   Cadon Raczka S M.D on 09/30/2016 at 6:11 AM  Between 7am to 7pm - Pager - [phone]. After 7pm go to [email or web address] - password [facility]  [facility] - Office  [phone]

## 2016-09-30 NOTE — Progress Notes (Signed)
Pharmacy Antibiotic Note  Andrew Kline is a 51 y.o. male admitted on 09/30/2016 with cellulitis.  Pharmacy has been consulted for Vancomycin and Zosyn dosing.  Plan: Vancomycin 1250mg   IV every 12 hours.  Goal trough 10-15 mcg/mL. Zosyn 3.375g IV q8h (4 hour infusion).  F/U cxs and clinical progress Monitor V/S, labs, and levels as indicated  Height: 5\' 5"  (165.1 cm) Weight: 200 lb (90.7 kg) IBW/kg (Calculated) : 61.5  Temp (24hrs), Avg:99.3 F (37.4 C), Min:97.6 F (36.4 C), Max:100.9 F (38.3 C)   Recent Labs Lab 09/30/16 0245 09/30/16 0534  WBC 14.7*  --   CREATININE 1.00  --   LATICACIDVEN 1.1 1.1    Estimated Creatinine Clearance: 88.5 mL/min (by C-G formula based on SCr of 1 mg/dL).    Allergies  Allergen Reactions  . Oxaliplatin Itching    Antimicrobials this admission: Vancomycin 6/1 >>  Zosyn 6/1 >>   Dose adjustments this admission: N/A  Microbiology results: 6/1 BCx: pending  Thank you for allowing pharmacy to be a part of this patient's care.  Isac Sarna, BS Pharm D, [place] Clinical Pharmacist Pager [phone] 09/30/2016 9:52 AM

## 2016-09-30 NOTE — ED Notes (Signed)
Pt states he is not currently on chemo, reports he is under hospital care and is a DNR, that hospital should have on file.

## 2016-09-30 NOTE — Consult Note (Signed)
Reason for Consult: Presacral abscess, metastatic rectal carcinoma Referring Physician: Dr. Memon  Andrew Kline is an 51 y.o. male.  HPI: Patient is a 51-year-old white male well known to me with metastatic rectal carcinoma, status post APR in October 2016 with a complicated course including urethral injury, status post cystostomy tube placement who presents with recurrent episodes of presacral and perirectal abscesses. They have it at times extended into the left buttock. He is now currently in hospice care. He states he started developing rectal pressure and pain. He presented the emergency room and CT scan of the abdomen and pelvis revealed a worsening presacral abscess as compared to a previous CAT scan for the same thing. Upon admission to the floor, he had a sudden rupture of the abscess with significant relief of pain. He denies fever or chills. He has had issues with pain control in the past. He currently has a 4 out of 10 pain.  Past Medical History:  Diagnosis Date  . Anxiety   . Arthritis   . Depression   . Hospice care   . Kidney stone 03/22/15   left  . Rectal cancer (HCC)   . Rectal pain   . Renal cell cancer (HCC)    2012.   . Shortness of breath dyspnea     Past Surgical History:  Procedure Laterality Date  . BIOPSY N/A 09/30/2014   Procedure: BIOPSY;  Surgeon: Sandi L Fields, MD;  Location: [facility];  Service: Endoscopy;  Laterality: N/A;  Anal Canal  . COLOSTOMY N/A 02/11/2015   Procedure: COLOSTOMY;  Surgeon: Mark Jenkins, MD;  Location: [facility];  Service: General;  Laterality: N/A;  . FLEXIBLE SIGMOIDOSCOPY N/A 09/30/2014   Procedure: FLEXIBLE SIGMOIDOSCOPY;  Surgeon: Sandi L Fields, MD;  Location: [facility];  Service: Endoscopy;  Laterality: N/A;  . FLEXIBLE SIGMOIDOSCOPY N/A 10/17/2014   Procedure: FLEXIBLE SIGMOIDOSCOPY;  Surgeon: Sandi L Fields, MD;  Location: [facility];  Service: Endoscopy;  Laterality: N/A;  1325  . HYPOSPADIAS CORRECTION    . IR GENERIC  HISTORICAL  12/17/2015   IR CATHETER TUBE CHANGE 12/17/2015 [facility]  . IR GENERIC HISTORICAL  01/27/2016   IR NEPHROSTOMY PLACEMENT RIGHT 01/27/2016 [facility]  . IR GENERIC HISTORICAL  01/27/2016   IR NEPHROSTOMY PLACEMENT LEFT 01/27/2016 [facility]  . IR GENERIC HISTORICAL  03/08/2016   IR NEPHROSTOMY EXCHANGE LEFT 03/08/2016 Michael Shick, MD [facility]  . IR GENERIC HISTORICAL  03/08/2016   IR NEPHROSTOMY EXCHANGE RIGHT 03/08/2016 Michael Shick, MD [facility]  . IR GENERIC HISTORICAL  03/07/2016   IR CATHETER TUBE CHANGE 03/07/2016 [facility]  . IR GENERIC HISTORICAL  03/28/2016   IR CATHETER TUBE CHANGE 03/28/2016 Jaime Wagner, DO [facility]  . IR GENERIC HISTORICAL  03/28/2016   IR NEPHROSTOMY TUBE CHANGE 03/28/2016 Jaime Wagner, DO [facility]  . IR GENERIC HISTORICAL  06/16/2016   IR CATHETER TUBE CHANGE 06/16/2016 Glenn Yamagata, MD [facility]  . IR GENERIC HISTORICAL  06/16/2016   IR NEPHROSTOGRAM LEFT THRU EXISTING ACCESS 06/16/2016 Glenn Yamagata, MD [facility]  . KNEE SURGERY Left    arthroscopy  . LAPAROSCOPIC PARTIAL NEPHRECTOMY  2012   right side  . LUNG BIOPSY Right   . PERINEAL PROCTECTOMY N/A 08/26/2015   Procedure:  PROCTECTOMY;  Surgeon: Mark Jenkins, MD;  Location: [facility];  Service: General;  Laterality: N/A;  . RECTAL SURGERY      Family History  Problem Relation Age of Onset  . Healthy   Mother   . Lung cancer Maternal Grandfather   . Colon cancer Maternal Uncle        age 81  . Diabetes Other     Social History:  reports that he has been smoking Cigarettes.  He has a 15.00 pack-year smoking history. He has never used smokeless tobacco. He reports that he does not drink alcohol or use drugs.  Allergies:  Allergies  Allergen Reactions  . Oxaliplatin Itching    Medications:  Scheduled: . amitriptyline  50 mg Oral QHS  . DULoxetine  60 mg Oral Daily  . enoxaparin (LOVENOX) injection  40 mg Subcutaneous Q24H  . pregabalin  150 mg  Oral BID    Results for orders placed or performed during the hospital encounter of 09/30/16 (from the past 48 hour(s))  Comprehensive metabolic panel     Status: Abnormal   Collection Time: 09/30/16  2:45 AM  Result Value Ref Range   Sodium 134 (L) 135 - 145 mmol/L   Potassium 3.0 (L) 3.5 - 5.1 mmol/L   Chloride 95 (L) 101 - 111 mmol/L   CO2 29 22 - 32 mmol/L   Glucose, Bld 169 (H) 65 - 99 mg/dL   BUN 8 6 - 20 mg/dL   Creatinine, Ser 1.00 0.61 - 1.24 mg/dL   Calcium 8.1 (L) 8.9 - 10.3 mg/dL   Total Protein 7.0 6.5 - 8.1 g/dL   Albumin 2.3 (L) 3.5 - 5.0 g/dL   AST 52 (H) 15 - 41 U/L   ALT 39 17 - 63 U/L   Alkaline Phosphatase 254 (H) 38 - 126 U/L   Total Bilirubin 0.8 0.3 - 1.2 mg/dL   GFR calc non Af Amer >60 >60 mL/min   GFR calc Af Amer >60 >60 mL/min    Comment: (NOTE) The eGFR has been calculated using the CKD EPI equation. This calculation has not been validated in all clinical situations. eGFR's persistently <60 mL/min signify possible Chronic Kidney Disease.    Anion gap 10 5 - 15  CBC with Differential     Status: Abnormal   Collection Time: 09/30/16  2:45 AM  Result Value Ref Range   WBC 14.7 (H) 4.0 - 10.5 K/uL   RBC 4.41 4.22 - 5.81 MIL/uL   Hemoglobin 12.3 (L) 13.0 - 17.0 g/dL   HCT 36.9 (L) 39.0 - 52.0 %   MCV 83.7 78.0 - 100.0 fL   MCH 27.9 26.0 - 34.0 pg   MCHC 33.3 30.0 - 36.0 g/dL   RDW 16.3 (H) 11.5 - 15.5 %   Platelets 402 (H) 150 - 400 K/uL   Neutrophils Relative % 79 %   Neutro Abs 11.7 (H) 1.7 - 7.7 K/uL   Lymphocytes Relative 7 %   Lymphs Abs 1.0 0.7 - 4.0 K/uL   Monocytes Relative 11 %   Monocytes Absolute 1.6 (H) 0.1 - 1.0 K/uL   Eosinophils Relative 3 %   Eosinophils Absolute 0.4 0.0 - 0.7 K/uL   Basophils Relative 0 %   Basophils Absolute 0.0 0.0 - 0.1 K/uL  Lactic acid, plasma     Status: None   Collection Time: 09/30/16  2:45 AM  Result Value Ref Range   Lactic Acid, Venous 1.1 0.5 - 1.9 mmol/L  Culture, blood (routine x 2)      Status: None (Preliminary result)   Collection Time: 09/30/16  3:06 AM  Result Value Ref Range   Specimen Description BLOOD RIGHT ARM    Special Requests  BOTTLES DRAWN AEROBIC AND ANAEROBIC Blood Culture adequate volume   Culture NO GROWTH < 12 HOURS    Report Status PENDING   Culture, blood (routine x 2)     Status: None (Preliminary result)   Collection Time: 09/30/16  3:10 AM  Result Value Ref Range   Specimen Description BLOOD RIGHT HAND    Special Requests      BOTTLES DRAWN AEROBIC AND ANAEROBIC Blood Culture adequate volume   Culture NO GROWTH < 12 HOURS    Report Status PENDING   Lactic acid, plasma     Status: None   Collection Time: 09/30/16  5:34 AM  Result Value Ref Range   Lactic Acid, Venous 1.1 0.5 - 1.9 mmol/L    Ct Pelvis W Contrast  Result Date: 09/30/2016 CLINICAL DATA:  Draining wound at the coccyx, acute onset. EXAM: CT PELVIS WITH CONTRAST TECHNIQUE: Multidetector CT imaging of the pelvis was performed using the standard protocol following the bolus administration of intravenous contrast. CONTRAST:  100mL ISOVUE-300 IOPAMIDOL (ISOVUE-300) INJECTION 61% COMPARISON:  CT of the abdomen and pelvis performed 05/03/2016 FINDINGS: Urinary Tract: The bladder is decompressed, with a suprapubic catheter. Diffuse surrounding soft tissue inflammation is noted. Soft tissue inflammation tracks about the distal ureters bilaterally. Bowel: Visualized small and large bowel loops are grossly unremarkable, aside from herniation of multiple small bowel loops into the patient's left lower quadrant colostomy site. There is no evidence for bowel obstruction. Vascular/Lymphatic: Scattered calcification is noted at the aortic bifurcation and its branches. No retroperitoneal or pelvic sidewall lymphadenopathy is seen. Reproductive:  The prostate is not definitely characterized. Other: There has been significant interval increase in the size of the patient's lower pelvic abscess, measuring 8.7  x 5.9 x 3.5 cm and tracking inferiorly about the left side of the anorectal canal. Extensive surrounding phlegmon and soft tissue inflammation are seen. Soft tissue edema tracks along the medial left gluteus muscle, with a 6 cm long tract from the abscess to the medial left buttock skin surface. Mild soft tissue inflammation and skin thickening are noted tracking along the left buttocks, compatible with cellulitis. Musculoskeletal: No acute osseous abnormalities are identified. The visualized musculature is unremarkable in appearance. IMPRESSION: 1. Significant interval increase in size of patient's lower pelvic abscess, now measuring 8.7 x 5.9 x 3.5 cm and tracking inferiorly about the left side of the anorectal canal. Extensive surrounding phlegmon and soft tissue inflammation noted. Soft tissue edema tracks along the medial left gluteus muscle, with a 6 cm long tract from the abscess to the medial left buttock surface. 2. Mild soft tissue inflammation and skin thickening along the left buttocks, compatible with cellulitis. 3. Diffuse soft tissue inflammation noted about the bladder, and along the distal ureters bilaterally. 4. Herniation of multiple small bowel loops into the patient's left lower quadrant colostomy site. No evidence of bowel obstruction. 5. Scattered aortic atherosclerosis. Electronically Signed   By: Jeffery  Chang M.D.   On: 09/30/2016 05:43    ROS:  Pertinent items are noted in HPI.  Blood pressure 98/60, pulse 87, temperature 97.6 F (36.4 C), temperature source Oral, resp. rate 20, height 5' 5" (1.651 m), weight 200 lb (90.7 kg), SpO2 94 %. Physical Exam: Pleasant white male in no acute distress. Head is normocephalic, atraumatic Lungs clear auscultation with breath sounds bilaterally. Heart examination reveals a regular rate and rhythm without S3, S4, murmurs Abdomen reveals a large parastomal hernia around her left-sided colostomy. Colostomy is functional. Rectal examination  reveals   a draining wound just towards the coccyx from the rectum with minimal induration into the left buttock. Examination was limited due to patient discomfort. CT scan images personally reviewed H&P reviewed  Assessment/Plan: Impression: Metastatic rectal carcinoma, hospice care. Presacral abscess/phlegmon, resolving Plan: No need for acute surgical intervention at this time. Would continue current IV antibiotics. Will follow with you.  Mark Jenkins 09/30/2016, 2:11 PM     

## 2016-09-30 NOTE — ED Notes (Signed)
ED Provider at bedside. 

## 2016-09-30 NOTE — Progress Notes (Signed)
Patient admitted to the hospital earlier this morning by Dr. Darrick Meigs  Patient seen and examined. He reports improvement in his pain since abscess was ruptured and is draining.  He was admitted to the hospital with left-sided perirectal abscess. Abscess has started to spontaneously drain. He is on intravenous antibiotics. Seen by general surgery and no indication for surgery at this time. We'll send wound culture. Continue current treatments.  Roxan Yamamoto

## 2016-09-30 NOTE — ED Triage Notes (Signed)
Pt has a draining wound to his coccyx area that he states has broken open this morning.

## 2016-10-01 DIAGNOSIS — E876 Hypokalemia: Secondary | ICD-10-CM | POA: Diagnosis not present

## 2016-10-01 DIAGNOSIS — C20 Malignant neoplasm of rectum: Secondary | ICD-10-CM | POA: Diagnosis not present

## 2016-10-01 DIAGNOSIS — K611 Rectal abscess: Secondary | ICD-10-CM | POA: Diagnosis not present

## 2016-10-01 DIAGNOSIS — C7800 Secondary malignant neoplasm of unspecified lung: Secondary | ICD-10-CM

## 2016-10-01 DIAGNOSIS — L0231 Cutaneous abscess of buttock: Secondary | ICD-10-CM | POA: Diagnosis not present

## 2016-10-01 LAB — HIV ANTIBODY (ROUTINE TESTING W REFLEX): HIV SCREEN 4TH GENERATION: NONREACTIVE

## 2016-10-01 LAB — COMPREHENSIVE METABOLIC PANEL
ALK PHOS: 219 U/L — AB (ref 38–126)
ALT: 31 U/L (ref 17–63)
ANION GAP: 7 (ref 5–15)
AST: 31 U/L (ref 15–41)
Albumin: 2 g/dL — ABNORMAL LOW (ref 3.5–5.0)
BUN: 9 mg/dL (ref 6–20)
CALCIUM: 7.8 mg/dL — AB (ref 8.9–10.3)
CO2: 30 mmol/L (ref 22–32)
Chloride: 104 mmol/L (ref 101–111)
Creatinine, Ser: 0.92 mg/dL (ref 0.61–1.24)
Glucose, Bld: 111 mg/dL — ABNORMAL HIGH (ref 65–99)
POTASSIUM: 3.4 mmol/L — AB (ref 3.5–5.1)
Sodium: 141 mmol/L (ref 135–145)
Total Bilirubin: 0.4 mg/dL (ref 0.3–1.2)
Total Protein: 6.2 g/dL — ABNORMAL LOW (ref 6.5–8.1)

## 2016-10-01 LAB — CBC
HEMATOCRIT: 35.3 % — AB (ref 39.0–52.0)
Hemoglobin: 11.3 g/dL — ABNORMAL LOW (ref 13.0–17.0)
MCH: 27.4 pg (ref 26.0–34.0)
MCHC: 32 g/dL (ref 30.0–36.0)
MCV: 85.7 fL (ref 78.0–100.0)
Platelets: 408 10*3/uL — ABNORMAL HIGH (ref 150–400)
RBC: 4.12 MIL/uL — ABNORMAL LOW (ref 4.22–5.81)
RDW: 16.8 % — AB (ref 11.5–15.5)
WBC: 8.2 10*3/uL (ref 4.0–10.5)

## 2016-10-01 MED ORDER — SULFAMETHOXAZOLE-TRIMETHOPRIM 800-160 MG PO TABS: 1.0000 | ORAL_TABLET | Freq: Two times a day (BID) | ORAL | 0 refills | Status: DC

## 2016-10-01 NOTE — Progress Notes (Signed)
Discharge instructions gone over with patient, verbalized understanding. IV removed, patient tolerated procedure well. 

## 2016-10-01 NOTE — Progress Notes (Signed)
Subjective: Patient still complains of back pain. He is on chronic pain medication for his metastatic rectal cancer.  Objective: Vital signs in last 24 hours: Temp:  [97.6 F (36.4 C)-98 F (36.7 C)] 98 F (36.7 C) (06/02 0602) Pulse Rate:  [66-87] 66 (06/02 0602) Resp:  [20] 20 (06/02 0602) BP: (98-117)/(60-74) 117/62 (06/02 0602) SpO2:  [94 %-95 %] 94 % (06/02 0602) Last BM Date:  (colostomy)  Intake/Output from previous day: 06/01 0701 - 06/02 0700 In: 5083.3 [P.O.:600; I.V.:1833.3; IV Piggyback:2650] Out: 1850 [Urine:1850] Intake/Output this shift: No intake/output data recorded.  General appearance: alert, cooperative and no distress Skin: Still with drainage from wound at sacrum.  Lab Results:   Recent Labs  09/30/16 0245 10/01/16 0531  WBC 14.7* 8.2  HGB 12.3* 11.3*  HCT 36.9* 35.3*  PLT 402* 408*   BMET  Recent Labs  09/30/16 0245 10/01/16 0531  NA 134* 141  K 3.0* 3.4*  CL 95* 104  CO2 29 30  GLUCOSE 169* 111*  BUN 8 9  CREATININE 1.00 0.92  CALCIUM 8.1* 7.8*   PT/INR No results for input(s): LABPROT, INR in the last 72 hours.  Studies/Results: Ct Pelvis W Contrast  Result Date: 09/30/2016 CLINICAL DATA:  Draining wound at the coccyx, acute onset. EXAM: CT PELVIS WITH CONTRAST TECHNIQUE: Multidetector CT imaging of the pelvis was performed using the standard protocol following the bolus administration of intravenous contrast. CONTRAST:  173mL ISOVUE-300 IOPAMIDOL (ISOVUE-300) INJECTION 61% COMPARISON:  CT of the abdomen and pelvis performed 05/03/2016 FINDINGS: Urinary Tract: The bladder is decompressed, with a suprapubic catheter. Diffuse surrounding soft tissue inflammation is noted. Soft tissue inflammation tracks about the distal ureters bilaterally. Bowel: Visualized small and large bowel loops are grossly unremarkable, aside from herniation of multiple small bowel loops into the patient's left lower quadrant colostomy site. There is no  evidence for bowel obstruction. Vascular/Lymphatic: Scattered calcification is noted at the aortic bifurcation and its branches. No retroperitoneal or pelvic sidewall lymphadenopathy is seen. Reproductive:  The prostate is not definitely characterized. Other: There has been significant interval increase in the size of the patient's lower pelvic abscess, measuring 8.7 x 5.9 x 3.5 cm and tracking inferiorly about the left side of the anorectal canal. Extensive surrounding phlegmon and soft tissue inflammation are seen. Soft tissue edema tracks along the medial left gluteus muscle, with a 6 cm long tract from the abscess to the medial left buttock skin surface. Mild soft tissue inflammation and skin thickening are noted tracking along the left buttocks, compatible with cellulitis. Musculoskeletal: No acute osseous abnormalities are identified. The visualized musculature is unremarkable in appearance. IMPRESSION: 1. Significant interval increase in size of patient's lower pelvic abscess, now measuring 8.7 x 5.9 x 3.5 cm and tracking inferiorly about the left side of the anorectal canal. Extensive surrounding phlegmon and soft tissue inflammation noted. Soft tissue edema tracks along the medial left gluteus muscle, with a 6 cm long tract from the abscess to the medial left buttock surface. 2. Mild soft tissue inflammation and skin thickening along the left buttocks, compatible with cellulitis. 3. Diffuse soft tissue inflammation noted about the bladder, and along the distal ureters bilaterally. 4. Herniation of multiple small bowel loops into the patient's left lower quadrant colostomy site. No evidence of bowel obstruction. 5. Scattered aortic atherosclerosis. Electronically Signed   By: Garald Balding M.D.   On: 09/30/2016 05:43    Anti-infectives: Anti-infectives    Start     Dose/Rate Route Frequency  Ordered Stop   09/30/16 1200  vancomycin (VANCOCIN) 1,250 mg in sodium chloride 0.9 % 250 mL IVPB     1,250  mg 166.7 mL/hr over 90 Minutes Intravenous Every 12 hours 09/30/16 0934     09/30/16 0930  piperacillin-tazobactam (ZOSYN) IVPB 3.375 g     3.375 g 12.5 mL/hr over 240 Minutes Intravenous Every 8 hours 09/30/16 0929     09/30/16 0300  vancomycin (VANCOCIN) IVPB 1000 mg/200 mL premix     1,000 mg 200 mL/hr over 60 Minutes Intravenous  Once 09/30/16 0258 09/30/16 0428      Assessment/Plan: Impression: Perirectal abscess, acute on chronic. Has started draining on its own. Patient would like to go home. Would suggest 10 day course of Bactrum. Follow-up with me as needed.  LOS: 0 days    Aviva Signs 10/01/2016

## 2016-10-01 NOTE — Discharge Summary (Signed)
Physician Discharge Summary  Andrew Kline ZOX:[id number] DOB: [birth year] DOA: 09/30/2016  PCP: Patrici Ranks, MD  Admit date: 09/30/2016 Discharge date: 10/01/2016  Admitted From: home Disposition:  home  Recommendations for Outpatient Follow-up:  1. Follow up Dr. 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 He reportedly had an underlying abscess and had significant pressure and pain in his left buttock. He was admitted to the hospital and started on intravenous antibiotics. Shortly after admission, his abscess spontaneously started draining. He was seen by general surgery who did not feel that further surgical intervention was necessary since his abscess was already draining. It was recommended to continue a ten-day course of antibiotics. The patient's leukocytosis has resolved. He is not having any fevers. He is requesting discharge home. Patient is felt stable to discharge home. He is already followed by hospice.  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   docusate sodium 100 MG capsule Commonly known as:  COLACE Take 1 capsule (100 mg total) by mouth every 12 (twelve) hours. What changed:  when to take this  reasons to take this   DULoxetine 60 MG capsule Commonly known  as:  CYMBALTA Take 1 capsule (60 mg total) by mouth daily.   HYDROmorphone 2 MG tablet Commonly known as:  DILAUDID Take 1-2 tablets (2-4 mg total) by mouth every 4 (four) hours as needed for severe pain.   Morphine Sulfate ER 15 MG T12a Take 1 tablet by mouth 2 (two) times daily. Taking with 30mg  ER to total 45mg  BID   Morphine Sulfate ER 30 MG T12a Take 1 tablet by mouth 2 (two) times daily. With 15mg  ER totaling 45mg  BID   pregabalin 150 MG capsule Commonly known as:  LYRICA Take 150 mg by mouth 2 (two) times daily.   sulfamethoxazole-trimethoprim 800-160 MG tablet Commonly known as:  BACTRIM DS,SEPTRA DS Take 1 tablet by mouth 2 (two) times daily.   zolpidem 12.5 MG CR tablet Commonly known as:  AMBIEN CR Take 12.5 mg by mouth at bedtime as needed for sleep.       Allergies  Allergen Reactions  . Oxaliplatin Itching    Consultations: General surgery  Procedures/Studies: Ct Pelvis W Contrast  Result Date: 09/30/2016 CLINICAL DATA:  Draining wound at the coccyx, acute onset. EXAM: CT PELVIS WITH CONTRAST TECHNIQUE: Multidetector CT imaging of the pelvis was performed using the standard protocol following the bolus administration of intravenous contrast. CONTRAST:  174mL ISOVUE-300 IOPAMIDOL (ISOVUE-300) INJECTION 61% COMPARISON:  CT of the abdomen and pelvis performed 05/03/2016 FINDINGS: Urinary Tract: The bladder is decompressed, with a suprapubic catheter. Diffuse surrounding soft tissue inflammation is noted. Soft tissue inflammation tracks about the distal  ureters bilaterally. Bowel: Visualized small and large bowel loops are grossly unremarkable, aside from herniation of multiple small bowel loops into the patient's left lower quadrant colostomy site. There is no evidence for bowel obstruction. Vascular/Lymphatic: Scattered calcification is noted at the aortic bifurcation and its branches. No retroperitoneal or pelvic sidewall lymphadenopathy is seen. Reproductive:  The  prostate is not definitely characterized. Other: There has been significant interval increase in the size of the patient's lower pelvic abscess, measuring 8.7 x 5.9 x 3.5 cm and tracking inferiorly about the left side of the anorectal canal. Extensive surrounding phlegmon and soft tissue inflammation are seen. Soft tissue edema tracks along the medial left gluteus muscle, with a 6 cm long tract from the abscess to the medial left buttock skin surface. Mild soft tissue inflammation and skin thickening are noted tracking along the left buttocks, compatible with cellulitis. Musculoskeletal: No acute osseous abnormalities are identified. The visualized musculature is unremarkable in appearance. IMPRESSION: 1. Significant interval increase in size of patient's lower pelvic abscess, now measuring 8.7 x 5.9 x 3.5 cm and tracking inferiorly about the left side of the anorectal canal. Extensive surrounding phlegmon and soft tissue inflammation noted. Soft tissue edema tracks along the medial left gluteus muscle, with a 6 cm long tract from the abscess to the medial left buttock surface. 2. Mild soft tissue inflammation and skin thickening along the left buttocks, compatible with cellulitis. 3. Diffuse soft tissue inflammation noted about the bladder, and along the distal ureters bilaterally. 4. Herniation of multiple small bowel loops into the patient's left lower quadrant colostomy site. No evidence of bowel obstruction. 5. Scattered aortic atherosclerosis. Electronically Signed   By: Garald Balding M.D.   On: 09/30/2016 05:43      Subjective: Less pressure in wound. Feeling better. Wound continues to drain. Wants to go home  Discharge Exam: Vitals:   09/30/16 2259 10/01/16 0602  BP: 109/74 117/62  Pulse: 73 66  Resp: 20 20  Temp: 97.6 F (36.4 C) 98 F (36.7 C)   Vitals:   09/30/16 0814 09/30/16 1339 09/30/16 2259 10/01/16 0602  BP: (!) 95/48 98/60 109/74 117/62  Pulse: 69 87 73 66  Resp: 18 20 20 20    Temp: 97.6 F (36.4 C) 97.6 F (36.4 C) 97.6 F (36.4 C) 98 F (36.7 C)  TempSrc: Oral Oral Oral Oral  SpO2: 90% 94% 95% 94%  Weight:      Height:        General: Pt is alert, awake, not in acute distress Cardiovascular: RRR, S1/S2 +, no rubs, no gallops Respiratory: CTA bilaterally, no wheezing, no rhonchi Abdominal: Soft, NT, ND, bowel sounds + Extremities: no edema, no cyanosis Skin: large area of induration on left buttocks with quarter sized wound that is draining purulent fluid    The results of significant diagnostics from this hospitalization (including imaging, microbiology, ancillary and laboratory) are listed below for reference.     Microbiology: Recent Results (from the past 240 hour(s))  Culture, blood (routine x 2)     Status: None (Preliminary result)   Collection Time: 09/30/16  3:06 AM  Result Value Ref Range Status   Specimen Description BLOOD RIGHT ARM  Final   Special Requests   Final    BOTTLES DRAWN AEROBIC AND ANAEROBIC Blood Culture adequate volume   Culture NO GROWTH 1 DAY  Final   Report Status PENDING  Incomplete  Culture, blood (routine x 2)     Status: None (Preliminary result)  Collection Time: 09/30/16  3:10 AM  Result Value Ref Range Status   Specimen Description BLOOD RIGHT HAND  Final   Special Requests   Final    BOTTLES DRAWN AEROBIC AND ANAEROBIC Blood Culture adequate volume   Culture NO GROWTH 1 DAY  Final   Report Status PENDING  Incomplete  Aerobic Culture (superficial specimen)     Status: None (Preliminary result)   Collection Time: 09/30/16  6:41 PM  Result Value Ref Range Status   Specimen Description BUTTOCKS  Final   Special Requests NONE  Final   Gram Stain   Final    NO WBC SEEN FEW GRAM NEGATIVE RODS FEW GRAM POSITIVE COCCI IN CLUSTERS Performed at [facility], [address], [place], [place] [address]    Culture PENDING  Incomplete   Report Status PENDING  Incomplete     Labs: BNP (last 3  results) No results for input(s): BNP in the last 8760 hours. Basic Metabolic Panel:  Recent Labs Lab 09/30/16 0245 10/01/16 0531  NA 134* 141  K 3.0* 3.4*  CL 95* 104  CO2 29 30  GLUCOSE 169* 111*  BUN 8 9  CREATININE 1.00 0.92  CALCIUM 8.1* 7.8*   Liver Function Tests:  Recent Labs Lab 09/30/16 0245 10/01/16 0531  AST 52* 31  ALT 39 31  ALKPHOS 254* 219*  BILITOT 0.8 0.4  PROT 7.0 6.2*  ALBUMIN 2.3* 2.0*   No results for input(s): LIPASE, AMYLASE in the last 168 hours. No results for input(s): AMMONIA in the last 168 hours. CBC:  Recent Labs Lab 09/30/16 0245 10/01/16 0531  WBC 14.7* 8.2  NEUTROABS 11.7*  --   HGB 12.3* 11.3*  HCT 36.9* 35.3*  MCV 83.7 85.7  PLT 402* 408*   Cardiac Enzymes: No results for input(s): CKTOTAL, CKMB, CKMBINDEX, TROPONINI in the last 168 hours. BNP: Invalid input(s): POCBNP CBG: No results for input(s): GLUCAP in the last 168 hours. D-Dimer No results for input(s): DDIMER in the last 72 hours. Hgb A1c No results for input(s): HGBA1C in the last 72 hours. Lipid Profile No results for input(s): CHOL, HDL, LDLCALC, TRIG, CHOLHDL, LDLDIRECT in the last 72 hours. Thyroid function studies No results for input(s): TSH, T4TOTAL, T3FREE, THYROIDAB in the last 72 hours.  Invalid input(s): FREET3 Anemia work up No results for input(s): VITAMINB12, FOLATE, FERRITIN, TIBC, IRON, RETICCTPCT in the last 72 hours. Urinalysis    Component Value Date/Time   COLORURINE YELLOW 03/28/2016 0048   APPEARANCEUR HAZY (A) 03/28/2016 0048   LABSPEC 1.012 03/28/2016 0048   PHURINE 6.0 03/28/2016 0048   GLUCOSEU NEGATIVE 03/28/2016 0048   HGBUR MODERATE (A) 03/28/2016 0048   BILIRUBINUR NEGATIVE 03/28/2016 0048   KETONESUR NEGATIVE 03/28/2016 0048   PROTEINUR 100 (A) 03/28/2016 0048   UROBILINOGEN 0.2 03/17/2015 0910   NITRITE NEGATIVE 03/28/2016 0048   LEUKOCYTESUR MODERATE (A) 03/28/2016 0048   Sepsis Labs Invalid input(s):  PROCALCITONIN,  WBC,  LACTICIDVEN Microbiology Recent Results (from the past 240 hour(s))  Culture, blood (routine x 2)     Status: None (Preliminary result)   Collection Time: 09/30/16  3:06 AM  Result Value Ref Range Status   Specimen Description BLOOD RIGHT ARM  Final   Special Requests   Final    BOTTLES DRAWN AEROBIC AND ANAEROBIC Blood Culture adequate volume   Culture NO GROWTH 1 DAY  Final   Report Status PENDING  Incomplete  Culture, blood (routine x 2)  Status: None (Preliminary result)   Collection Time: 09/30/16  3:10 AM  Result Value Ref Range Status   Specimen Description BLOOD RIGHT HAND  Final   Special Requests   Final    BOTTLES DRAWN AEROBIC AND ANAEROBIC Blood Culture adequate volume   Culture NO GROWTH 1 DAY  Final   Report Status PENDING  Incomplete  Aerobic Culture (superficial specimen)     Status: None (Preliminary result)   Collection Time: 09/30/16  6:41 PM  Result Value Ref Range Status   Specimen Description BUTTOCKS  Final   Special Requests NONE  Final   Gram Stain   Final    NO WBC SEEN FEW GRAM NEGATIVE RODS FEW GRAM POSITIVE COCCI IN CLUSTERS Performed at [facility], [place] [address] [place], [place] [address]    Culture PENDING  Incomplete   Report Status PENDING  Incomplete     Time coordinating discharge: Over 30 minutes  SIGNED:   Kathie Dike, MD  [facility] 10/01/2016, 12:51 PM Pager   If 7PM-7AM, please contact night-coverage [email or web address] Password [email or web address]

## 2016-10-03 LAB — AEROBIC CULTURE W GRAM STAIN (SUPERFICIAL SPECIMEN)

## 2016-10-03 LAB — AEROBIC CULTURE  (SUPERFICIAL SPECIMEN): GRAM STAIN: NONE SEEN

## 2016-10-05 LAB — CULTURE, BLOOD (ROUTINE X 2)
CULTURE: NO GROWTH
CULTURE: NO GROWTH
SPECIAL REQUESTS: ADEQUATE
Special Requests: ADEQUATE

## 2016-11-05 ENCOUNTER — Encounter (HOSPITAL_COMMUNITY): Payer: Self-pay | Admitting: *Deleted

## 2016-11-05 ENCOUNTER — Emergency Department (HOSPITAL_COMMUNITY)
Admission: EM | Admit: 2016-11-05 | Discharge: 2016-11-06 | Disposition: A | Discharge status: Home / Self Care | Payer: Medicaid Other | Attending: Emergency Medicine | Admitting: Emergency Medicine

## 2016-11-05 DIAGNOSIS — F17210 Nicotine dependence, cigarettes, uncomplicated: Secondary | ICD-10-CM | POA: Diagnosis not present

## 2016-11-05 DIAGNOSIS — Z960 Presence of urogenital implants: Secondary | ICD-10-CM | POA: Insufficient documentation

## 2016-11-05 DIAGNOSIS — Z79899 Other long term (current) drug therapy: Secondary | ICD-10-CM | POA: Diagnosis not present

## 2016-11-05 DIAGNOSIS — R339 Retention of urine, unspecified: Secondary | ICD-10-CM | POA: Insufficient documentation

## 2016-11-05 DIAGNOSIS — Y658 Other specified misadventures during surgical and medical care: Secondary | ICD-10-CM | POA: Diagnosis not present

## 2016-11-05 DIAGNOSIS — T83021A Displacement of indwelling urethral catheter, initial encounter: Secondary | ICD-10-CM | POA: Diagnosis present

## 2016-11-05 DIAGNOSIS — C218 Malignant neoplasm of overlapping sites of rectum, anus and anal canal: Secondary | ICD-10-CM | POA: Insufficient documentation

## 2016-11-05 NOTE — ED Triage Notes (Signed)
Pt states catheter came out around 1730 this afternoon.

## 2016-11-06 ENCOUNTER — Encounter (HOSPITAL_COMMUNITY): Payer: Self-pay | Admitting: Emergency Medicine

## 2016-11-06 ENCOUNTER — Emergency Department (HOSPITAL_COMMUNITY)
Admission: EM | Admit: 2016-11-06 | Discharge: 2016-11-06 | Disposition: A | Discharge status: Home / Self Care | Payer: Medicaid Other | Source: Home / Self Care | Attending: Emergency Medicine | Admitting: Emergency Medicine

## 2016-11-06 ENCOUNTER — Emergency Department (HOSPITAL_COMMUNITY): Payer: Medicaid Other

## 2016-11-06 DIAGNOSIS — C20 Malignant neoplasm of rectum: Secondary | ICD-10-CM

## 2016-11-06 DIAGNOSIS — Z9359 Other cystostomy status: Secondary | ICD-10-CM

## 2016-11-06 HISTORY — PX: IR CATHETER TUBE CHANGE: IMG717

## 2016-11-06 MED ORDER — HYDROMORPHONE HCL 1 MG/ML IJ SOLN
2.0000 mg | Freq: Once | INTRAMUSCULAR | Status: AC
  Administered 2016-11-06: 2 mg via INTRAVENOUS
  Filled 2016-11-06: qty 2

## 2016-11-06 MED ORDER — HYDROMORPHONE HCL 2 MG/ML IJ SOLN
2.0000 mg | Freq: Once | INTRAMUSCULAR | Status: AC
  Administered 2016-11-06: 2 mg via INTRAMUSCULAR
  Filled 2016-11-06: qty 1

## 2016-11-06 MED ORDER — LIDOCAINE VISCOUS 2 % MT SOLN
OROMUCOSAL | Status: AC
  Filled 2016-11-06: qty 15

## 2016-11-06 MED ORDER — IOPAMIDOL (ISOVUE-300) INJECTION 61%
INTRAVENOUS | Status: AC
  Administered 2016-11-06: 10 mL
  Filled 2016-11-06: qty 50

## 2016-11-06 MED ORDER — POVIDONE-IODINE 10 % EX SOLN
CUTANEOUS | Status: AC
  Administered 2016-11-06: 02:00:00
  Filled 2016-11-06: qty 118

## 2016-11-06 MED ORDER — LIDOCAINE HCL (PF) 1 % IJ SOLN
INTRAMUSCULAR | Status: DC | PRN
  Administered 2016-11-06: 10 mL

## 2016-11-06 MED ORDER — LIDOCAINE HCL (PF) 1 % IJ SOLN
INTRAMUSCULAR | Status: AC
  Filled 2016-11-06: qty 30

## 2016-11-06 MED ORDER — LIDOCAINE VISCOUS 2 % MT SOLN
OROMUCOSAL | Status: DC | PRN
  Administered 2016-11-06: 15 mL via OROMUCOSAL

## 2016-11-06 NOTE — Procedures (Signed)
Interventional Radiology Procedure Note  Procedure:  Suprapubic catheter replacement  Complications: None  Estimated Blood Loss: None  New 16 Fr pigtail suprapubic catheter advanced through pre-existing tract into bladder lumen.  Good return of urine after placement.  Venetia Night. Kathlene Cote, M.D Pager:  [phone]

## 2016-11-06 NOTE — ED Provider Notes (Signed)
[facility] DEPT Provider Note   CSN: [id number] Arrival date & time: 11/06/16  1215     History   Chief Complaint Chief Complaint  Patient presents with  . Suprapubic catheter out    HPI Andrew Kline is a 51 y.o. male who presents to the emergency department with a suprapubic catheter problem. He reports that his 91 French suprapubic catheter that was placed in February 2018 came out yesterday evening. He reports that he was seen at [facility] and the physician spoke with his urologist, Dr. Diona Fanti, who recommended IR placement of a new catheter. He reports he was given the phone number for the on-call radiologist and was told if he didn't get a phone call pain in the morning he should come to [facility] for IR placement. He reports insertion of a  Foley catheter was attempted last night without success.  The history is provided by the patient. No language interpreter was used.    Past Medical History:  Diagnosis Date  . Anxiety   . Arthritis   . Depression   . Hospice care   . Kidney stone 03/22/15   left  . Rectal cancer ([place])   . Rectal pain   . Renal cell cancer ([place])    2012.   [name] Shortness of breath dyspnea     Patient Active Problem List   Diagnosis Date Noted  . Hypokalemia 10/01/2016  . Left buttock abscess 09/30/2016  . Perirectal abscess   . Urinary retention 03/28/2016  . Suprapubic catheter dysfunction ([place]) 03/28/2016  . Goals of care, counseling/discussion   . DNR (do not resuscitate) discussion   . Pyomyositis: Left gluteus maximus per CT 03/07/2016 03/08/2016  . Carcinoma of kidney ([place])   . Sepsis affecting skin ([place])   . Suprapubic catheter ([place])   . Uncontrolled pain   . Palliative care encounter   . Pelvic abscess in male [facility])   . Sepsis ([place]) 03/04/2016  . Renal cell carcinoma ([place]) 03/04/2016  . Hyponatremia 03/04/2016  . Complicated UTI (urinary tract infection) 01/25/2016  . Physical deconditioning 07/07/2015  . Muscular  deconditioning 07/07/2015  . Malnutrition of moderate degree ([place]) 02/13/2015  . 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 BIOPSY N/A 09/30/2014   Procedure: BIOPSY;  Surgeon: Danie Binder, MD;  Location: [facility] ORS;  Service: Endoscopy;  Laterality: N/A;  Anal Canal  . COLOSTOMY N/A 02/11/2015   Procedure: COLOSTOMY;  Surgeon: Aviva Signs, MD;  Location: [facility] ORS;  Service: General;  Laterality: N/A;  . FLEXIBLE SIGMOIDOSCOPY N/A 09/30/2014   Procedure: FLEXIBLE SIGMOIDOSCOPY;  Surgeon: Danie Binder, MD;  Location: [facility] ORS;  Service: Endoscopy;  Laterality: N/A;  . FLEXIBLE SIGMOIDOSCOPY N/A 10/17/2014   Procedure: FLEXIBLE SIGMOIDOSCOPY;  Surgeon: Danie Binder, MD;  Location: [facility] ENDO SUITE;  Service: Endoscopy;  Laterality: N/A;  1325  . HYPOSPADIAS CORRECTION    . IR CATHETER TUBE CHANGE  11/06/2016  . IR GENERIC HISTORICAL  12/17/2015   IR CATHETER TUBE CHANGE 12/17/2015 [facility]-INTERV RAD  . IR GENERIC HISTORICAL  01/27/2016   IR NEPHROSTOMY PLACEMENT RIGHT 01/27/2016 [facility]INTER[facility]  . IR GENERIC HISTORICAL  01/27/2016   IR NEPHROSTOMY PLACEMENT LEFT 01/27/2016 [facility]  . IR GENERIC HISTORICAL  03/08/2016   IR NEPHROSTOMY EXCHANGE LEFT 03/08/2016 Greggory Keen, MD [facility]  . IR GENERIC HISTORICAL  03/08/2016   IR NEPHROSTOMY EXCHANGE RIGHT 03/08/2016 Greggory Keen, MD [facility]  .  IR GENERIC HISTORICAL  03/07/2016   IR CATHETER TUBE CHANGE 03/07/2016 [facility]  . IR GENERIC HISTORICAL  03/28/2016   IR CATHETER TUBE CHANGE 03/28/2016 Corrie Mckusick, DO [facility]  . IR GENERIC HISTORICAL  03/28/2016   IR NEPHROSTOMY TUBE CHANGE 03/28/2016 Corrie Mckusick, DO [facility]  . IR GENERIC HISTORICAL  06/16/2016   IR CATHETER TUBE CHANGE 06/16/2016 Aletta Edouard, MD [facility]  . IR GENERIC HISTORICAL  06/16/2016   IR NEPHROSTOGRAM LEFT THRU EXISTING ACCESS 06/16/2016 Aletta Edouard, MD [facility]  . KNEE SURGERY  Left    arthroscopy  . LAPAROSCOPIC PARTIAL NEPHRECTOMY  2012   right side  . LUNG BIOPSY Right   . PERINEAL PROCTECTOMY N/A 08/26/2015   Procedure:  PROCTECTOMY;  Surgeon: Aviva Signs, MD;  Location: [facility] ORS;  Service: General;  Laterality: N/A;  . RECTAL SURGERY         Home Medications    Prior to Admission medications   Medication Sig Start Date End Date Taking? Authorizing Provider  amitriptyline (ELAVIL) 50 MG tablet TAKE ONE TABLET BY MOUTH AT BEDTIME. 05/10/16  Yes Penland, Kelby Fam, MD  docusate sodium (COLACE) 100 MG capsule Take 1 capsule (100 mg total) by mouth every 12 (twelve) hours. Patient taking differently: Take 100 mg by mouth daily as needed for mild constipation or moderate constipation.  09/25/14  Yes Lawyer, Harrell Gave, PA-C  DULoxetine (CYMBALTA) 60 MG capsule Take 1 capsule (60 mg total) by mouth daily. 12/31/15  Yes Penland, Kelby Fam, MD  HYDROmorphone (DILAUDID) 4 MG tablet Take 4 mg by mouth every 6 (six) hours as needed (breakthrough pain).   Yes [provider]  Morphine Sulfate ER 15 MG T12A Take 15 mg by mouth See admin instructions. Take 1 tablet (15 mg) by mouth twice daily - with a 30 mg tablet for a 45 mg dose   Yes [provider]  Morphine Sulfate ER 30 MG T12A Take 30 mg by mouth See admin instructions. Take 1 tablet (30 mg) by mouth twice daily - with a 15 mg tablet for a 45 mg dose   Yes [provider]  naproxen sodium (ALEVE) 220 MG tablet Take 440 mg by mouth 2 (two) times daily as needed (headache).   Yes [provider]  OXYGEN Inhale 4 L into the lungs as needed (shortness of breath).   Yes [provider]  pregabalin (LYRICA) 150 MG capsule Take 150 mg by mouth 2 (two) times daily.   Yes [provider]  temazepam (RESTORIL) 15 MG capsule Take 15 mg by mouth at bedtime.    Yes [provider]  HYDROmorphone (DILAUDID) 2 MG tablet Take 1-2 tablets (2-4 mg total) by mouth every 4  (four) hours as needed for severe pain. Patient not taking: Reported on 11/06/2016 03/29/16   Geradine Girt, DO    Family History Family History  Problem Relation Age of Onset  . Healthy Mother   . Lung cancer Maternal Grandfather   . Colon cancer Maternal Uncle        age 65  . Diabetes Other     Social History Social History  Substance Use Topics  . Smoking status: Current Every Day Smoker    Packs/day: 0.50    Years: 30.00    Types: Cigarettes  . Smokeless tobacco: Never Used  . Alcohol use No     Comment: Occ     Allergies   Oxaliplatin   Review of Systems  Review of Systems  Constitutional: Negative for activity change.  Respiratory: Negative for shortness of breath.   Cardiovascular: Negative for chest pain.  Gastrointestinal: Negative for abdominal pain.  Musculoskeletal: Negative for back pain.  Skin: Positive for wound (Suprapubic catheter out). Negative for rash.    Physical Exam Updated Vital Signs BP 129/68 (BP Location: Right Arm)   Pulse 84   Temp 98.8 F (37.1 C) (Oral)   Resp 16   Ht 5\' 5"  (1.651 m)   Wt 86.2 kg (190 lb)   SpO2 91%   BMI 31.62 kg/m   Physical Exam  Constitutional: He appears well-developed.  HENT:  Head: Normocephalic.  Eyes: Conjunctivae are normal.  Neck: Neck supple.  Cardiovascular: Normal rate and regular rhythm.   No murmur heard. Pulmonary/Chest: Effort normal.  Abdominal: Soft. He exhibits no distension.  There is an opening in the suprapubic region consistent with a suprapubic catheter. No evidence of surrounding infection.   Genitourinary:  Genitourinary Comments: Chaperoned exam. No erythema, swelling, or redness to the glans penis or the surrounding area.   Neurological: He is alert.  Skin: Skin is warm and dry. No rash noted. No erythema. No pallor.  Psychiatric: His behavior is normal.  Nursing note and vitals reviewed.    ED Treatments / Results  Labs (all labs ordered are listed, but only  abnormal results are displayed) Labs Reviewed - No data to display  EKG  EKG Interpretation None       Radiology Ir Catheter Tube Change  Result Date: 11/06/2016 INDICATION: Chronic indwelling suprapubic bladder catheter has fallen out and requires replacement. EXAM: SUPRAPUBIC BLADDER CATHETER REPLACEMENT COMPARISON:  06/16/2016 MEDICATIONS: None ANESTHESIA/SEDATION: None CONTRAST:  10 mL Isovue-300 - administered into the collecting system(s) FLUOROSCOPY TIME:  Fluoroscopy Time: 18 seconds. COMPLICATIONS: None immediate. PROCEDURE: Informed written consent was obtained from the patient after a thorough discussion of the procedural risks, benefits and alternatives. All questions were addressed. Maximal Sterile Barrier Technique was utilized including caps, mask, sterile gowns, sterile gloves, sterile drape, hand hygiene and skin antiseptic. A timeout was performed prior to the initiation of the procedure. Skin overlying the suprapubic catheter exit site was prepped with chlorhexidine. A 5 French catheter was advanced into the bladder lumen through the pre-existing tract. Contrast was injected into the bladder under fluoroscopy. Over a guidewire, a new 57 French pigtail suprapubic catheter was advanced. The catheter was formed and positioning confirmed by fluoroscopy. The catheter was then attached to a gravity drainage bag. FINDINGS: The pre-existing percutaneous tract was easily cannulated and a 16 French suprapubic catheter advanced into the bladder lumen. There was immediate return of a large amount of urine. IMPRESSION: Replacement of suprapubic catheter with new 16 French drainage catheter. This was advanced into the bladder lumen. Electronically Signed   By: Aletta Edouard M.D.   On: 11/06/2016 16:45    Procedures Procedures (including critical care time)  Medications Ordered in ED Medications  HYDROmorphone (DILAUDID) injection 2 mg (2 mg Intravenous Given 11/06/16 1426)  iopamidol  (ISOVUE-300) 61 % injection (10 mLs  Contrast Given 11/06/16 1558)     Initial Impression / Assessment and Plan / ED Course  I have reviewed the triage vital signs and the nursing notes.  Pertinent labs & imaging results that were available during my care of the patient were reviewed by me and considered in my medical decision making (see chart for details).     Patient presenting with suprapubic catheter problem. Per instructions given to  patient and MDM from yesterday's ED visit, consulted IR who will replace the catheter. Discussed the patient with Dr. Leonette Monarch, attending physician. Pain controlled in the ED. Patient care transferred to PA [facility] at the end of my shift. Patient presentation, ED course, and plan of care discussed with review of all pertinent labs and imaging. Please see his/her note for further details regarding further ED course and disposition.   Final Clinical Impressions(s) / ED Diagnoses   Final diagnoses:  Suprapubic catheter [facility])    New Prescriptions Discharge Medication List as of 11/06/2016  4:40 PM       Joanne Gavel, PA-C 11/08/16 0044    Fatima Blank, MD 11/10/16 0013

## 2016-11-06 NOTE — ED Triage Notes (Signed)
Pt reports suprapubic catheter came out yesterday evening. Pt seen at [facility] for same where they tried to insert foley without success. Pt has old suprapubic tube in zip lock bag. Pt reports that he does make urine through penis but does not have muscle tone.

## 2016-11-06 NOTE — ED Provider Notes (Signed)
[facility] DEPT Provider Note   CSN: [id number] Arrival date & time: 11/05/16  2327     History   Chief Complaint Chief Complaint  Patient presents with  . super pub cath came out    HPI Andrew Kline is a 51 y.o. male.  The history is provided by the patient.  patient presents with suprapubic catheter problem He reports it came out several hrs ago He now has low abdominal pain from unable to urinate No fever/vomiting No other acute complaints He had otherwise been at his baseline He has had catheter in place since February His course is worsening Nothing improves his symptoms   Past Medical History:  Diagnosis Date  . Anxiety   . Arthritis   . Depression   . Hospice care   . Kidney stone 03/22/15   left  . Rectal cancer ([place])   . Rectal pain   . Renal cell cancer ([place])    2012.   [name] Shortness of breath dyspnea     Patient Active Problem List   Diagnosis Date Noted  . Hypokalemia 10/01/2016  . Left buttock abscess 09/30/2016  . Perirectal abscess   . Urinary retention 03/28/2016  . Suprapubic catheter dysfunction ([place]) 03/28/2016  . Goals of care, counseling/discussion   . DNR (do not resuscitate) discussion   . Pyomyositis: Left gluteus maximus per CT 03/07/2016 03/08/2016  . Carcinoma of kidney ([place])   . Sepsis affecting skin ([place])   . Suprapubic catheter ([place])   . Uncontrolled pain   . Palliative care encounter   . Pelvic abscess in male [facility])   . Sepsis ([place]) 03/04/2016  . Renal cell carcinoma ([place]) 03/04/2016  . Hyponatremia 03/04/2016  . Complicated UTI (urinary tract infection) 01/25/2016  . Physical deconditioning 07/07/2015  . Muscular deconditioning 07/07/2015  . Malnutrition of moderate degree ([place]) 02/13/2015  . Rectal cancer metastasized to lung [facility]) 10/30/2014    Past Surgical History:  Procedure Laterality Date  . BIOPSY N/A 09/30/2014   Procedure: BIOPSY;  Surgeon: Danie Binder, MD;  Location: [facility] ORS;  Service: Endoscopy;   Laterality: N/A;  Anal Canal  . COLOSTOMY N/A 02/11/2015   Procedure: COLOSTOMY;  Surgeon: Aviva Signs, MD;  Location: [facility] ORS;  Service: General;  Laterality: N/A;  . FLEXIBLE SIGMOIDOSCOPY N/A 09/30/2014   Procedure: FLEXIBLE SIGMOIDOSCOPY;  Surgeon: Danie Binder, MD;  Location: [facility]S;  Service: Endoscopy;  Laterality: N/A;  . FLEXIBLE SIGMOIDOSCOPY N/A 10/17/2014   Procedure: FLEXIBLE SIGMOIDOSCOPY;  Surgeon: Danie Binder, MD;  Location: [facility]O SUITE;  Service: Endoscopy;  Laterality: N/A;  1325  . HYPOSPADIAS CORRECTION    . IR GENERIC HISTORICAL  12/17/2015   IR CATHETER TUBE CHANGE 12/17/2015 [facility]  . IR GENERIC HISTORICAL  01/27/2016   IR NEPHROSTOMY PLACEMENT RIGHT 01/27/2016 [facility]  . IR GENERIC HISTORICAL  01/27/2016   IR NEPHROSTOMY PLACEMENT LEFT 01/27/2016 [facility]  . IR GENERIC HISTORICAL  03/08/2016   IR NEPHROSTOMY EXCHANGE LEFT 03/08/2016 Greggory Keen, MD [facility]  . IR GENERIC HISTORICAL  03/08/2016   IR NEPHROSTOMY EXCHANGE RIGHT 03/08/2016 Greggory Keen, MD [facility]  . IR GENERIC HISTORICAL  03/07/2016   IR CATHETER TUBE CHANGE 03/07/2016 [facility]  . IR GENERIC HISTORICAL  03/28/2016   IR CATHETER TUBE CHANGE 03/28/2016 Corrie Mckusick, DO [facility]  . IR GENERIC HISTORICAL  03/28/2016   IR NEPHROSTOMY TUBE CHANGE 03/28/2016 Corrie Mckusick, DO [facility]  . IR GENERIC HISTORICAL  06/16/2016  IR CATHETER TUBE CHANGE 06/16/2016 Aletta Edouard, MD [facility]  . IR GENERIC HISTORICAL  06/16/2016   IR NEPHROSTOGRAM LEFT THRU EXISTING ACCESS 06/16/2016 Aletta Edouard, MD [facility]  . KNEE SURGERY Left    arthroscopy  . LAPAROSCOPIC PARTIAL NEPHRECTOMY  2012   right side  . LUNG BIOPSY Right   . PERINEAL PROCTECTOMY N/A 08/26/2015   Procedure:  PROCTECTOMY;  Surgeon: Aviva Signs, MD;  Location: [facility] OR[facility];  Service: General;  Laterality: N/A;  . RECTAL SURGERY         Home Medications    Prior to Admission medications   Medication  Sig Start Date End Date Taking? Authorizing Provider  amitriptyline (ELAVIL) 50 MG tablet TAKE ONE TABLET BY MOUTH AT BEDTIME. 05/10/16  Yes Penland, Kelby Fam, MD  docusate sodium (COLACE) 100 MG capsule Take 1 capsule (100 mg total) by mouth every 12 (twelve) hours. Patient taking differently: Take 100 mg by mouth daily as needed for mild constipation or moderate constipation.  09/25/14  Yes Lawyer, Harrell Gave, PA-C  DULoxetine (CYMBALTA) 60 MG capsule Take 1 capsule (60 mg total) by mouth daily. 12/31/15  Yes Penland, Kelby Fam, MD  HYDROmorphone (DILAUDID) 2 MG tablet Take 1-2 tablets (2-4 mg total) by mouth every 4 (four) hours as needed for severe pain. Patient taking differently: Take 4 mg by mouth every 4 (four) hours as needed for severe pain.  03/29/16  Yes Eulogio Bear U, DO  Morphine Sulfate ER 15 MG T12A Take 1 tablet by mouth 2 (two) times daily. Taking with 30mg  ER to total 45mg  BID   Yes [provider]  Morphine Sulfate ER 30 MG T12A Take 1 tablet by mouth 2 (two) times daily. With 15mg  ER totaling 45mg  BID   Yes [provider]  pregabalin (LYRICA) 150 MG capsule Take 150 mg by mouth 2 (two) times daily.   Yes [provider]  temazepam (RESTORIL) 15 MG capsule Take 15 mg by mouth at bedtime as needed for sleep.   Yes [provider]  sulfamethoxazole-trimethoprim (BACTRIM DS,SEPTRA DS) 800-160 MG tablet Take 1 tablet by mouth 2 (two) times daily. 10/01/16   Kathie Dike, MD  zolpidem (AMBIEN CR) 12.5 MG CR tablet Take 12.5 mg by mouth at bedtime as needed for sleep.    [provider]    Family History Family History  Problem Relation Age of Onset  . Healthy Mother   . Lung cancer Maternal Grandfather   . Colon cancer Maternal Uncle        age 64  . Diabetes Other     Social History Social History  Substance Use Topics  . Smoking status: Current Every Day Smoker    Packs/day: 0.50    Years: 30.00    Types: Cigarettes  .  Smokeless tobacco: Never Used  . Alcohol use No     Comment: Occ     Allergies   Oxaliplatin   Review of Systems Review of Systems  Constitutional: Negative for fever.  Gastrointestinal: Negative for vomiting.  Genitourinary: Positive for difficulty urinating.  All other systems reviewed and are negative.    Physical Exam Updated Vital Signs BP (!) 158/72 (BP Location: Left Arm)   Pulse (!) 104   Temp 98.4 F (36.9 C) (Oral)   Resp 18   Ht 1.651 m (5\' 5" )   Wt 86.2 kg (190 lb)   SpO2 98%   BMI 31.62 kg/m   Physical Exam CONSTITUTIONAL: Well developed/well nourished HEAD:  Normocephalic/atraumatic EYES: EOMI ENMT: Mucous membranes moist NECK: supple no meningeal signs CV: S1/S2 noted, no murmurs/rubs/gallops noted LUNGS: Lungs are clear to auscultation bilaterally, no apparent distress ABDOMEN: soft, nontender, colostomy in place GU:SP catheter stoma noted, no drainage/erythema noted NEURO: Pt is awake/alert/appropriate SKIN: warm, color normal PSYCH: no abnormalities of mood noted, alert and oriented to situation   ED Treatments / Results  Labs (all labs ordered are listed, but only abnormal results are displayed) Labs Reviewed  URINE CULTURE    EKG  EKG Interpretation None       Radiology No results found.  Procedures Procedures (including critical care time)  Medications Ordered in ED Medications  povidone-iodine (BETADINE) 10 % external solution (not administered)  HYDROmorphone (DILAUDID) injection 2 mg (2 mg Intramuscular Given 11/06/16 0025)     Initial Impression / Assessment and Plan / ED Course  I have reviewed the triage vital signs and the nursing notes.       1:44 AM  I attempted to place a foley via SP catheter stoma I was unable to place cathete (area was cleansed with betadine to sterilize) but unsuccessful placement I spoke to dr Diona Fanti, he requested me to call radiology as they placed all catheters via IR  approach 1:48 AM D/w radiology Phone call [phone]) and order placed and pt can f/u later today for catheter placement This number was provided by on call radiologist Pt is able to urinate via urethra, but unable to place foley via urethra due to previous surgeries Pt feels comfortable with this plan I told him to stay NPO If he does not get a phone call in the morning he should present to [facility] for IR placement   Final Clinical Impressions(s) / ED Diagnoses   Final diagnoses:  Urinary retention    New Prescriptions New Prescriptions   No medications on file     Ripley Fraise, MD 11/06/16 [phone]

## 2016-11-06 NOTE — Discharge Instructions (Signed)
IF YOU DO NOT GET A CALL BY 10AM , PLEASE PROCEED TO [facility]

## 2016-11-06 NOTE — ED Provider Notes (Signed)
Successful insertion of suprapubic catheter by IR.  Pt stable for discharge.  He will f/u with urology as needed. Current O2 sats 89%.  Pt sts he has hx of lung cancer and that's his norm.  He's not reporting any SOB at this time.     BP 134/60   Pulse 96   Temp 98.8 F (37.1 C) (Oral)   Resp 16   Ht 5\' 5"  (1.651 m)   Wt 86.2 kg (190 lb)   SpO2 (!) 89%   BMI 31.62 kg/m   Results for orders placed or performed during the hospital encounter of 09/30/16  Culture, blood (routine x 2)  Result Value Ref Range   Specimen Description BLOOD RIGHT ARM    Special Requests      BOTTLES DRAWN AEROBIC AND ANAEROBIC Blood Culture adequate volume   Culture NO GROWTH 5 DAYS    Report Status 10/05/2016 FINAL   Culture, blood (routine x 2)  Result Value Ref Range   Specimen Description BLOOD RIGHT HAND    Special Requests      BOTTLES DRAWN AEROBIC AND ANAEROBIC Blood Culture adequate volume   Culture NO GROWTH 5 DAYS    Report Status 10/05/2016 FINAL   Aerobic Culture (superficial specimen)  Result Value Ref Range   Specimen Description BUTTOCKS    Special Requests NONE    Gram Stain      NO WBC SEEN FEW GRAM NEGATIVE RODS FEW GRAM POSITIVE COCCI IN CLUSTERS Performed at [facility], [place] [address], [place], [place] [address]    Culture MULTIPLE ORGANISMS PRESENT, NONE PREDOMINANT (A)    Report Status 10/03/2016 FINAL   Comprehensive metabolic panel  Result Value Ref Range   Sodium 134 (L) 135 - 145 mmol/L   Potassium 3.0 (L) 3.5 - 5.1 mmol/L   Chloride 95 (L) 101 - 111 mmol/L   CO2 29 22 - 32 mmol/L   Glucose, Bld 169 (H) 65 - 99 mg/dL   BUN 8 6 - 20 mg/dL   Creatinine, Ser 1.00 0.61 - 1.24 mg/dL   Calcium 8.1 (L) 8.9 - 10.3 mg/dL   Total Protein 7.0 6.5 - 8.1 g/dL   Albumin 2.3 (L) 3.5 - 5.0 g/dL   AST 52 (H) 15 - 41 U/L   ALT 39 17 - 63 U/L   Alkaline Phosphatase 254 (H) 38 - 126 U/L   Total Bilirubin 0.8 0.3 - 1.2 mg/dL   GFR calc non Af Amer >60 >60 mL/min   GFR  calc Af Amer >60 >60 mL/min   Anion gap 10 5 - 15  CBC with Differential  Result Value Ref Range   WBC 14.7 (H) 4.0 - 10.5 K/uL   RBC 4.41 4.22 - 5.81 MIL/uL   Hemoglobin 12.3 (L) 13.0 - 17.0 g/dL   HCT 36.9 (L) 39.0 - 52.0 %   MCV 83.7 78.0 - 100.0 fL   MCH 27.9 26.0 - 34.0 pg   MCHC 33.3 30.0 - 36.0 g/dL   RDW 16.3 (H) 11.5 - 15.5 %   Platelets 402 (H) 150 - 400 K/uL   Neutrophils Relative % 79 %   Neutro Abs 11.7 (H) 1.7 - 7.7 K/uL   Lymphocytes Relative 7 %   Lymphs Abs 1.0 0.7 - 4.0 K/uL   Monocytes Relative 11 %   Monocytes Absolute 1.6 (H) 0.1 - 1.0 K/uL   Eosinophils Relative 3 %   Eosinophils Absolute 0.4 0.0 - 0.7 K/uL   Basophils Relative  0 %   Basophils Absolute 0.0 0.0 - 0.1 K/uL  Lactic acid, plasma  Result Value Ref Range   Lactic Acid, Venous 1.1 0.5 - 1.9 mmol/L  Lactic acid, plasma  Result Value Ref Range   Lactic Acid, Venous 1.1 0.5 - 1.9 mmol/L  HIV antibody (Routine Testing)  Result Value Ref Range   HIV Screen 4th Generation wRfx Non Reactive Non Reactive  CBC  Result Value Ref Range   WBC 8.2 4.0 - 10.5 K/uL   RBC 4.12 (L) 4.22 - 5.81 MIL/uL   Hemoglobin 11.3 (L) 13.0 - 17.0 g/dL   HCT 35.3 (L) 39.0 - 52.0 %   MCV 85.7 78.0 - 100.0 fL   MCH 27.4 26.0 - 34.0 pg   MCHC 32.0 30.0 - 36.0 g/dL   RDW 16.8 (H) 11.5 - 15.5 %   Platelets 408 (H) 150 - 400 K/uL  Comprehensive metabolic panel  Result Value Ref Range   Sodium 141 135 - 145 mmol/L   Potassium 3.4 (L) 3.5 - 5.1 mmol/L   Chloride 104 101 - 111 mmol/L   CO2 30 22 - 32 mmol/L   Glucose, Bld 111 (H) 65 - 99 mg/dL   BUN 9 6 - 20 mg/dL   Creatinine, Ser 0.92 0.61 - 1.24 mg/dL   Calcium 7.8 (L) 8.9 - 10.3 mg/dL   Total Protein 6.2 (L) 6.5 - 8.1 g/dL   Albumin 2.0 (L) 3.5 - 5.0 g/dL   AST 31 15 - 41 U/L   ALT 31 17 - 63 U/L   Alkaline Phosphatase 219 (H) 38 - 126 U/L   Total Bilirubin 0.4 0.3 - 1.2 mg/dL   GFR calc non Af Amer >60 >60 mL/min   GFR calc Af Amer >60 >60 mL/min   Anion gap  7 5 - 15   No results found.    Domenic Moras, PA-C 11/06/16 1641    Charlesetta Shanks, MD 11/16/16 2018

## 2016-11-06 NOTE — ED Notes (Signed)
ED Provider at bedside. 

## 2016-12-15 ENCOUNTER — Inpatient Hospital Stay (HOSPITAL_COMMUNITY)
Admission: EM | Admit: 2016-12-15 | Discharge: 2016-12-17 | DRG: 580 | Disposition: A | Discharge status: Hospice | Payer: Medicaid Other | Attending: Internal Medicine | Admitting: Internal Medicine

## 2016-12-15 ENCOUNTER — Encounter (HOSPITAL_COMMUNITY): Payer: Self-pay | Admitting: Emergency Medicine

## 2016-12-15 DIAGNOSIS — F17210 Nicotine dependence, cigarettes, uncomplicated: Secondary | ICD-10-CM | POA: Diagnosis present

## 2016-12-15 DIAGNOSIS — Z515 Encounter for palliative care: Secondary | ICD-10-CM | POA: Diagnosis present

## 2016-12-15 DIAGNOSIS — G8929 Other chronic pain: Secondary | ICD-10-CM | POA: Diagnosis present

## 2016-12-15 DIAGNOSIS — L0231 Cutaneous abscess of buttock: Principal | ICD-10-CM | POA: Diagnosis present

## 2016-12-15 DIAGNOSIS — Z888 Allergy status to other drugs, medicaments and biological substances status: Secondary | ICD-10-CM

## 2016-12-15 DIAGNOSIS — Z66 Do not resuscitate: Secondary | ICD-10-CM | POA: Diagnosis present

## 2016-12-15 DIAGNOSIS — C20 Malignant neoplasm of rectum: Secondary | ICD-10-CM | POA: Diagnosis present

## 2016-12-15 DIAGNOSIS — Z87442 Personal history of urinary calculi: Secondary | ICD-10-CM

## 2016-12-15 DIAGNOSIS — Z79899 Other long term (current) drug therapy: Secondary | ICD-10-CM

## 2016-12-15 DIAGNOSIS — L03317 Cellulitis of buttock: Secondary | ICD-10-CM | POA: Diagnosis present

## 2016-12-15 DIAGNOSIS — F329 Major depressive disorder, single episode, unspecified: Secondary | ICD-10-CM | POA: Diagnosis present

## 2016-12-15 DIAGNOSIS — Z79891 Long term (current) use of opiate analgesic: Secondary | ICD-10-CM

## 2016-12-15 DIAGNOSIS — Z933 Colostomy status: Secondary | ICD-10-CM

## 2016-12-15 DIAGNOSIS — L0291 Cutaneous abscess, unspecified: Secondary | ICD-10-CM | POA: Diagnosis present

## 2016-12-15 DIAGNOSIS — C7800 Secondary malignant neoplasm of unspecified lung: Secondary | ICD-10-CM | POA: Diagnosis present

## 2016-12-15 LAB — BASIC METABOLIC PANEL
ANION GAP: 11 (ref 5–15)
BUN: 8 mg/dL (ref 6–20)
CHLORIDE: 96 mmol/L — AB (ref 101–111)
CO2: 23 mmol/L (ref 22–32)
Calcium: 8.3 mg/dL — ABNORMAL LOW (ref 8.9–10.3)
Creatinine, Ser: 1.13 mg/dL (ref 0.61–1.24)
GFR calc non Af Amer: >60 mL/min (ref >60)
Glucose, Bld: 115 mg/dL — ABNORMAL HIGH (ref 65–99)
POTASSIUM: 3.5 mmol/L (ref 3.5–5.1)
SODIUM: 130 mmol/L — AB (ref 135–145)

## 2016-12-15 LAB — CBC WITH DIFFERENTIAL/PLATELET
BASOS ABS: 0 10*3/uL (ref 0.0–0.1)
Basophils Relative: 0 %
EOS ABS: 0.2 10*3/uL (ref 0.0–0.7)
Eosinophils Relative: 1 %
HCT: 38.7 % — ABNORMAL LOW (ref 39.0–52.0)
HEMOGLOBIN: 12.7 g/dL — AB (ref 13.0–17.0)
LYMPHS ABS: 1.2 10*3/uL (ref 0.7–4.0)
Lymphocytes Relative: 8 %
MCH: 27 pg (ref 26.0–34.0)
MCHC: 32.8 g/dL (ref 30.0–36.0)
MCV: 82.3 fL (ref 78.0–100.0)
Monocytes Absolute: 1.4 10*3/uL — ABNORMAL HIGH (ref 0.1–1.0)
Monocytes Relative: 8 %
NEUTROS PCT: 83 %
Neutro Abs: 13.8 10*3/uL — ABNORMAL HIGH (ref 1.7–7.7)
Platelets: 401 10*3/uL — ABNORMAL HIGH (ref 150–400)
RBC: 4.7 MIL/uL (ref 4.22–5.81)
RDW: 17.5 % — ABNORMAL HIGH (ref 11.5–15.5)
WBC: 16.6 10*3/uL — AB (ref 4.0–10.5)

## 2016-12-15 MED ORDER — ACETAMINOPHEN 325 MG PO TABS: 650.0000 mg | ORAL_TABLET | Freq: Four times a day (QID) | ORAL | Status: DC | PRN

## 2016-12-15 MED ORDER — TEMAZEPAM 15 MG PO CAPS
15.0000 mg | ORAL_CAPSULE | Freq: Every day | ORAL | Status: DC
  Administered 2016-12-16 (×2): 15 mg via ORAL
  Filled 2016-12-15 (×2): qty 1

## 2016-12-15 MED ORDER — ACETAMINOPHEN 650 MG RE SUPP: 650.0000 mg | Freq: Four times a day (QID) | RECTAL | Status: DC | PRN

## 2016-12-15 MED ORDER — MORPHINE SULFATE ER 15 MG PO TBCR
15.0000 mg | EXTENDED_RELEASE_TABLET | Freq: Two times a day (BID) | ORAL | Status: DC
  Administered 2016-12-16 – 2016-12-17 (×4): 15 mg via ORAL
  Filled 2016-12-15 (×4): qty 1

## 2016-12-15 MED ORDER — HYDROMORPHONE HCL 4 MG PO TABS
4.0000 mg | ORAL_TABLET | Freq: Four times a day (QID) | ORAL | Status: DC | PRN
  Administered 2016-12-16 – 2016-12-17 (×3): 4 mg via ORAL
  Filled 2016-12-15 (×3): qty 1

## 2016-12-15 MED ORDER — DOCUSATE SODIUM 100 MG PO CAPS
100.0000 mg | ORAL_CAPSULE | Freq: Two times a day (BID) | ORAL | Status: DC
  Administered 2016-12-16 – 2016-12-17 (×2): 100 mg via ORAL
  Filled 2016-12-15 (×4): qty 1

## 2016-12-15 MED ORDER — SODIUM CHLORIDE 0.9 % IV SOLN
INTRAVENOUS | Status: DC
  Administered 2016-12-16 – 2016-12-17 (×2): via INTRAVENOUS

## 2016-12-15 MED ORDER — ONDANSETRON HCL 4 MG/2ML IJ SOLN: 4.0000 mg | Freq: Four times a day (QID) | INTRAMUSCULAR | Status: DC | PRN

## 2016-12-15 MED ORDER — AMITRIPTYLINE HCL 25 MG PO TABS
50.0000 mg | ORAL_TABLET | Freq: Every day | ORAL | Status: DC
  Administered 2016-12-16 (×2): 50 mg via ORAL
  Filled 2016-12-15 (×2): qty 2

## 2016-12-15 MED ORDER — VANCOMYCIN HCL IN DEXTROSE 1-5 GM/200ML-% IV SOLN
1000.0000 mg | Freq: Once | INTRAVENOUS | Status: AC
  Administered 2016-12-15: 1000 mg via INTRAVENOUS
  Filled 2016-12-15: qty 200

## 2016-12-15 MED ORDER — DULOXETINE HCL 60 MG PO CPEP
60.0000 mg | ORAL_CAPSULE | Freq: Every day | ORAL | Status: DC
  Administered 2016-12-16 – 2016-12-17 (×2): 60 mg via ORAL
  Filled 2016-12-15 (×2): qty 1

## 2016-12-15 MED ORDER — PREGABALIN 75 MG PO CAPS
150.0000 mg | ORAL_CAPSULE | Freq: Two times a day (BID) | ORAL | Status: DC
  Administered 2016-12-16 – 2016-12-17 (×4): 150 mg via ORAL
  Filled 2016-12-15 (×4): qty 2

## 2016-12-15 MED ORDER — MORPHINE SULFATE 15 MG PO TABS
30.0000 mg | ORAL_TABLET | Freq: Two times a day (BID) | ORAL | Status: DC
  Administered 2016-12-16 – 2016-12-17 (×4): 30 mg via ORAL
  Filled 2016-12-15 (×4): qty 2

## 2016-12-15 MED ORDER — LIDOCAINE HCL (PF) 2 % IJ SOLN
INTRAMUSCULAR | Status: AC
  Filled 2016-12-15: qty 10

## 2016-12-15 MED ORDER — ONDANSETRON HCL 4 MG PO TABS: 4.0000 mg | ORAL_TABLET | Freq: Four times a day (QID) | ORAL | Status: DC | PRN

## 2016-12-15 MED ORDER — VANCOMYCIN HCL IN DEXTROSE 1-5 GM/200ML-% IV SOLN
1000.0000 mg | Freq: Two times a day (BID) | INTRAVENOUS | Status: DC
  Administered 2016-12-16 – 2016-12-17 (×3): 1000 mg via INTRAVENOUS
  Filled 2016-12-15: qty 200

## 2016-12-15 NOTE — ED Notes (Signed)
ED Provider at bedside. 

## 2016-12-15 NOTE — ED Provider Notes (Signed)
[facility] DEPT Provider Note   CSN: [id number] Arrival date & time: 12/15/16  1411     History   Chief Complaint Chief Complaint  Patient presents with  . Abscess    HPI Andrew Kline is a 51 y.o. male.  Patient is a 51 year old male with past medical history of rectal cancer status post colostomy. He also has a suprapubic catheter due to a urethral injury that occurred during his rectal cancer treatment. He presents today for evaluation of swelling, redness, and pain to his left buttock. This began in the absence of any injury or trauma. He denies any fevers or chills.   The history is provided by the patient.  Abscess  Abscess location: Buttock. Abscess quality: fluctuance, induration, painful and redness   Duration:  3 days Progression:  Worsening Pain details:    Quality:  Throbbing   Severity:  Severe   Timing:  Constant   Progression:  Worsening Chronicity:  Recurrent Context: not diabetes and not immunosuppression   Relieved by:  Nothing Worsened by:  Nothing Ineffective treatments:  None tried Associated symptoms: no fatigue and no fever     Past Medical History:  Diagnosis Date  . Anxiety   . Arthritis   . Depression   . Hospice care   . Kidney stone 03/22/15   left  . Rectal cancer ([place])   . Rectal pain   . Renal cell cancer ([place])    2012.   [name] Shortness of breath dyspnea     Patient Active Problem List   Diagnosis Date Noted  . Hypokalemia 10/01/2016  . Left buttock abscess 09/30/2016  . Perirectal abscess   . Urinary retention 03/28/2016  . Suprapubic catheter dysfunction ([place]) 03/28/2016  . Goals of care, counseling/discussion   . DNR (do not resuscitate) discussion   . Pyomyositis: Left gluteus maximus per CT 03/07/2016 03/08/2016  . Carcinoma of kidney ([place])   . Sepsis affecting skin ([place])   . Suprapubic catheter ([place])   . Uncontrolled pain   . Palliative care encounter   . Pelvic abscess in male [facility])   . Sepsis ([place])  03/04/2016  . Renal cell carcinoma ([place]) 03/04/2016  . Hyponatremia 03/04/2016  . Complicated UTI (urinary tract infection) 01/25/2016  . Physical deconditioning 07/07/2015  . Muscular deconditioning 07/07/2015  . Malnutrition of moderate degree ([name]) 02/13/2015  . 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 BIOPSY N/A 09/30/2014   Procedure: BIOPSY;  Surgeon: Danie Binder, MD;  Location: [facility] ORS;  Service: Endoscopy;  Laterality: N/A;  Anal Canal  . COLOSTOMY N/A 02/11/2015   Procedure: COLOSTOMY;  Surgeon: Aviva Signs, MD;  Location: [facility] ORS;  Service: General;  Laterality: N/A;  . FLEXIBLE SIGMOIDOSCOPY N/A 09/30/2014   Procedure: FLEXIBLE SIGMOIDOSCOPY;  Surgeon: Danie Binder, MD;  Location: [facility] ORS;  Service: Endoscopy;  Laterality: N/A;  . FLEXIBLE SIGMOIDOSCOPY N/A 10/17/2014   Procedure: FLEXIBLE SIGMOIDOSCOPY;  Surgeon: Danie Binder, MD;  Location[facility];  Service: Endoscopy;  Laterality: N/A;  1325  . HYPOSPADIAS CORRECTION    . IR CATHETER TUBE CHANGE  11/06/2016  . IR GENERIC HISTORICAL  12/17/2015   IR CATHETER TUBE CHANGE 12/17/2015 [facility]  . IR GENERIC HISTORICAL  01/27/2016   IR NEPHROSTOMY PLACEMENT RIGHT 01/27/2016 [facility]  . IR GENERIC HISTORICAL  01/27/2016   IR NEPHROSTOMY PLACEMENT LEFT 01/27/2016 [facility]  . IR GENERIC HISTORICAL  03/08/2016  IR NEPHROSTOMY EXCHANGE LEFT 03/08/2016 Greggory Keen, MD [facility]  . IR GENERIC HISTORICAL  03/08/2016   IR NEPHROSTOMY EXCHANGE RIGHT 03/08/2016 Greggory Keen, MD [facility]  . IR GENERIC HISTORICAL  03/07/2016   IR CATHETER TUBE CHANGE 03/07/2016 [facility]  . IR GENERIC HISTORICAL  03/28/2016   IR CATHETER TUBE CHANGE 03/28/2016 Corrie Mckusick, DO [facility]  . IR GENERIC HISTORICAL  03/28/2016   IR NEPHROSTOMY TUBE CHANGE 03/28/2016 Corrie Mckusick, DO [facility]  . IR GENERIC HISTORICAL  06/16/2016   IR CATHETER TUBE CHANGE  06/16/2016 Aletta Edouard, MD [facility]  . IR GENERIC HISTORICAL  06/16/2016   IR NEPHROSTOGRAM LEFT THRU EXISTING ACCESS 06/16/2016 Aletta Edouard, MD [facility]  . KNEE SURGERY Left    arthroscopy  . LAPAROSCOPIC PARTIAL NEPHRECTOMY  2012   right side  . LUNG BIOPSY Right   . PERINEAL PROCTECTOMY N/A 08/26/2015   Procedure:  PROCTECTOMY;  Surgeon: Aviva Signs, MD;  Location: [facility] ORS;  Service: General;  Laterality: N/A;  . RECTAL SURGERY         Home Medications    Prior to Admission medications   Medication Sig Start Date End Date Taking? Authorizing Provider  morphine (MSIR) 30 MG tablet Take 30 mg by mouth 2 (two) times daily. Take 1 tablet by mouth twice a day with 15mg  ER   Yes [provider]  amitriptyline (ELAVIL) 50 MG tablet TAKE ONE TABLET BY MOUTH AT BEDTIME. 05/10/16   Penland, Kelby Fam, MD  docusate sodium (COLACE) 100 MG capsule Take 1 capsule (100 mg total) by mouth every 12 (twelve) hours. Patient taking differently: Take 100 mg by mouth daily as needed for mild constipation or moderate constipation.  09/25/14   Lawyer, Harrell Gave, PA-C  DULoxetine (CYMBALTA) 60 MG capsule Take 1 capsule (60 mg total) by mouth daily. 12/31/15   Penland, Kelby Fam, MD  HYDROmorphone (DILAUDID) 4 MG tablet Take 4 mg by mouth every 6 (six) hours as needed (breakthrough pain).    [provider]  Morphine Sulfate ER 15 MG T12A Take 15 mg by mouth See admin instructions. Take 1 tablet (15 mg) by mouth twice daily - with a 30 mg tablet for a 45 mg dose    [provider]  naproxen sodium (ALEVE) 220 MG tablet Take 440 mg by mouth 2 (two) times daily as needed (headache).    [provider]  OXYGEN Inhale 4 L into the lungs as needed (shortness of breath).    [provider]  pregabalin (LYRICA) 150 MG capsule Take 150 mg by mouth 2 (two) times daily.    [provider]  temazepam (RESTORIL) 15 MG capsule Take 15 mg by mouth at bedtime.      [provider]    Family History Family History  Problem Relation Age of Onset  . Healthy Mother   . Lung cancer Maternal Grandfather   . Colon cancer Maternal Uncle        age 79  . Diabetes Other     Social History Social History  Substance Use Topics  . Smoking status: Current Every Day Smoker    Packs/day: 0.50    Years: 30.00    Types: Cigarettes  . Smokeless tobacco: Never Used  . Alcohol use No     Comment: Occ     Allergies   Oxaliplatin   Review of Systems Review of Systems  Constitutional: Negative for fatigue and fever.  All other systems  reviewed and are negative.    Physical Exam Updated Vital Signs BP (!) 161/86   Pulse (!) 120   Temp 98.7 F (37.1 C)   Resp 18   Ht 5\' 5"  (1.651 m)   Wt 86.2 kg (190 lb)   SpO2 95%   BMI 31.62 kg/m   Physical Exam  Constitutional: He is oriented to person, place, and time. He appears well-developed and well-nourished. No distress.  HENT:  Head: Normocephalic and atraumatic.  Mouth/Throat: Oropharynx is clear and moist.  Neck: Normal range of motion. Neck supple.  Cardiovascular: Normal rate and regular rhythm.  Exam reveals no friction rub.   No murmur heard. Pulmonary/Chest: Effort normal and breath sounds normal. No respiratory distress. He has no wheezes. He has no rales.  Abdominal: Soft. Bowel sounds are normal. He exhibits no distension. There is no tenderness.  Musculoskeletal: Normal range of motion. He exhibits no edema.  Neurological: He is alert and oriented to person, place, and time. Coordination normal.  Skin: Skin is warm and dry. He is not diaphoretic.  There is a large area of erythema, induration, and fluctuance to the left buttock.  Nursing note and vitals reviewed.    ED Treatments / Results  Labs (all labs ordered are listed, but only abnormal results are displayed) Labs Reviewed  CBC WITH DIFFERENTIAL/PLATELET  BASIC METABOLIC PANEL    EKG  EKG  Interpretation None       Radiology No results found.  Procedures Procedures (including critical care time)  Medications Ordered in ED Medications  lidocaine (XYLOCAINE) 2 % injection (not administered)     Initial Impression / Assessment and Plan / ED Course  I have reviewed the triage vital signs and the nursing notes.  Pertinent labs & imaging results that were available during my care of the patient were reviewed by me and considered in my medical decision making (see chart for details).  INCISION AND DRAINAGE Performed by: Veryl Speak Consent: Verbal consent obtained. Risks and benefits: risks, benefits and alternatives were discussed Type: abscess  Body area: Left buttock  Anesthesia: local infiltration  Incision was made with a scalpel.  Local anesthetic: lidocaine 1 % without epinephrine  Anesthetic total: 5 ml  Complexity: complex Blunt dissection to break up loculations  Drainage: purulent  Drainage amount: Copious   Packing material: None   Patient tolerance: Patient tolerated the procedure well with no immediate complications.   I&D performed as per procedure note above. Patient has a large area of induration, erythema, and continued purulent drainage on his buttock. He has a white count of 16,000. Due to the extent of cellulitis, I feel as though admission for repeat IV antibiotics as indicated. I've discussed this with Dr. Lorin Mercy who agrees to admit.  Final Clinical Impressions(s) / ED Diagnoses   Final diagnoses:  None    New Prescriptions New Prescriptions   No medications on file     Veryl Speak, MD 12/15/16 2121

## 2016-12-15 NOTE — H&P (Signed)
History and Physical    Andrew Kline WUJ:[id number] DOB: 18-Jan-1966 DOA: 12/15/2016  PCP: Marco Collie, MD Consultants:  Urology - [place]; Surgery - Arnoldo Morale Patient coming from:  Home - lives with wife and children; NOK: Wife, [phone]  Chief Complaint: buttocks abscess  HPI: Andrew Kline is a 51 y.o. male with medical history significant of remote renal cell cancer and now stage IV rectal cancer enrolled in Hospice presenting with a left buttocks asbcess.  He "noticed a pus-filled pocket in my ass, apparently".  It started hurting mid-day yesterday and today it was excruciating.  It was completely fine until yesterday.  He did have a h/o buttocks abscess in the same place in June - it was drained and he was admitted to the hospital for 2-3 days.  He has had no further difficulties since.  He has a h/o rectal cancer, stage IV.  He has a colostomy and suprapubic catheter.  Cancer is metastasized to the lungs and causing respiratory compromise.  Intermittent SOB.  +cough, nonproductive.  He is no longer receiving any treatment at all for this, including palliative.  He was enrolled in Hospice in Nov 2017.     ED Course: Patient with large left buttock abscess.  I&D with copious amount of foul-smelling purulent drainage.  Review of Systems: As per HPI; otherwise review of systems reviewed and negative.   Ambulatory Status:   Ambulates without assistance at times, other times he uses a cane  Past Medical History:  Diagnosis Date  . Anxiety   . Arthritis   . Depression   . Hospice care   . Kidney stone 03/22/15   left  . Rectal cancer ([place])   . Rectal pain   . Renal cell cancer ([place])    2012.   [name] Shortness of breath dyspnea     Past Surgical History:  Procedure Laterality Date  . BIOPSY N/A 09/30/2014   Procedure: BIOPSY;  Surgeon: Danie Binder, MD;  Location: [facility];  Service: Endoscopy;  Laterality: N/A;  Anal Canal  . COLOSTOMY N/A 02/11/2015   Procedure: COLOSTOMY;   Surgeon: Aviva Signs, MD;  Location: [facility];  Service: General;  Laterality: N/A;  . FLEXIBLE SIGMOIDOSCOPY N/A 09/30/2014   Procedure: FLEXIBLE SIGMOIDOSCOPY;  Surgeon: Danie Binder, MD;  Location: [facility];  Service: Endoscopy;  Laterality: N/A;  . FLEXIBLE SIGMOIDOSCOPY N/A 10/17/2014   Procedure: FLEXIBLE SIGMOIDOSCOPY;  Surgeon: Danie Binder, MD;  Location[facility];  Service: Endoscopy;  Laterality: N/A;  1325  . HYPOSPADIAS CORRECTION    . IR CATHETER TUBE CHANGE  11/06/2016  . IR GENERIC HISTORICAL  12/17/2015   IR CATHETER TUBE CHANGE 12/17/2015 [facility]  . IR GENERIC HISTORICAL  01/27/2016   IR NEPHROSTOMY PLACEMENT RIGHT 01/27/2016 [facility]  . IR GENERIC HISTORICAL  01/27/2016   IR NEPHROSTOMY PLACEMENT LEFT 01/27/2016 [facility]  . IR GENERIC HISTORICAL  03/08/2016   IR NEPHROSTOMY EXCHANGE LEFT 03/08/2016 Greggory Keen, MD [facility]  . IR GENERIC HISTORICAL  03/08/2016   IR NEPHROSTOMY EXCHANGE RIGHT 03/08/2016 Greggory Keen, MD [facility]  . IR GENERIC HISTORICAL  03/07/2016   IR CATHETER TUBE CHANGE 03/07/2016 [facility]  . IR GENERIC HISTORICAL  03/28/2016   IR CATHETER TUBE CHANGE 03/28/2016 Corrie Mckusick, DO [facility]  . IR GENERIC HISTORICAL  03/28/2016   IR NEPHROSTOMY TUBE CHANGE 03/28/2016 Corrie Mckusick, DO [facility]  . IR GENERIC HISTORICAL  06/16/2016   IR CATHETER TUBE CHANGE 06/16/2016  Aletta Edouard, MD [facility]  . IR GENERIC HISTORICAL  06/16/2016   IR NEPHROSTOGRAM LEFT THRU EXISTING ACCESS 06/16/2016 Aletta Edouard, MD [facility]  . KNEE SURGERY Left    arthroscopy  . LAPAROSCOPIC PARTIAL NEPHRECTOMY  2012   right side  . LUNG BIOPSY Right   . PERINEAL PROCTECTOMY N/A 08/26/2015   Procedure:  PROCTECTOMY;  Surgeon: Aviva Signs, MD;  Location: [facility];  Service: General;  Laterality: N/A;  . RECTAL SURGERY      Social History   Social History  . Marital status: Divorced    Spouse name: N/A  . Number of children: 2  .  Years of education: N/A   Occupational History  . disabled    Social History Main Topics  . Smoking status: Current Every Day Smoker    Packs/day: 0.50    Years: 30.00    Types: Cigarettes  . Smokeless tobacco: Never Used  . Alcohol use 0.0 oz/week     Comment: Occ  . Drug use: No  . Sexual activity: Yes    Birth control/ protection: None   Other Topics Concern  . Not on file   Social History Narrative  . No narrative on file    Allergies  Allergen Reactions  . Oxaliplatin Itching    Family History  Problem Relation Age of Onset  . Healthy Mother   . Lung cancer Maternal Grandfather   . Colon cancer Maternal Uncle        age 28  . Diabetes Other     Prior to Admission medications   Medication Sig Start Date End Date Taking? Authorizing Provider  morphine (MSIR) 30 MG tablet Take 30 mg by mouth 2 (two) times daily. Take 1 tablet by mouth twice a day with 15mg  ER   Yes [provider]  amitriptyline (ELAVIL) 50 MG tablet TAKE ONE TABLET BY MOUTH AT BEDTIME. 05/10/16   Penland, Kelby Fam, MD  docusate sodium (COLACE) 100 MG capsule Take 1 capsule (100 mg total) by mouth every 12 (twelve) hours. Patient taking differently: Take 100 mg by mouth daily as needed for mild constipation or moderate constipation.  09/25/14   Lawyer, Harrell Gave, PA-C  DULoxetine (CYMBALTA) 60 MG capsule Take 1 capsule (60 mg total) by mouth daily. 12/31/15   Penland, Kelby Fam, MD  HYDROmorphone (DILAUDID) 4 MG tablet Take 4 mg by mouth every 6 (six) hours as needed (breakthrough pain).    [provider]  Morphine Sulfate ER 15 MG T12A Take 15 mg by mouth See admin instructions. Take 1 tablet (15 mg) by mouth twice daily - with a 30 mg tablet for a 45 mg dose    [provider]  naproxen sodium (ALEVE) 220 MG tablet Take 440 mg by mouth 2 (two) times daily as needed (headache).    [provider]  OXYGEN Inhale 4 L into the lungs as needed (shortness of breath).     [provider]  pregabalin (LYRICA) 150 MG capsule Take 150 mg by mouth 2 (two) times daily.    [provider]  temazepam (RESTORIL) 15 MG capsule Take 15 mg by mouth at bedtime.     [provider]    Physical Exam: Vitals:   12/15/16 1418 12/15/16 1932 12/15/16 2000 12/15/16 2030  BP:  (!) 154/77 105/69 (!) 105/49  Pulse:  93 99 87  Resp:      Temp:      SpO2:  97% 93% 94%  Weight: 86.2 kg (190 lb)     Height: 5\' 5"  (1.651 m)        General:  Appears calm and comfortable and is NAD, somewhat cantankerous Eyes:  PERRL, EOMI, normal lids, iris ENT:  grossly normal hearing, lips & tongue, mmm; appropriate dentition Neck:  no LAD, masses or thyromegaly; no carotid bruits Cardiovascular:  RRR, no m/r/g. No LE edema.  Respiratory:   CTA bilaterally with no wheezes/rales/rhonchi.  Normal respiratory effort. Abdomen:  Colostomy in place with asymmetry of abdominal wall; also with suprapubic catheter Skin:  Left buttocks with packing and bandage in place but still with some drainage of foul-smelling discharge Musculoskeletal:  grossly normal tone BUE/BLE, good ROM, no bony abnormality Psychiatric:  Blunted mood and affect, speech fluent and appropriate, AOx3 Neurologic:  CN 2-12 grossly intact, moves all extremities in coordinated fashion, sensation intact    Radiological Exams on Admission: No results found.  EKG: Not done   Labs on Admission: I have personally reviewed the available labs and imaging studies at the time of the admission.  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 [facility])   Left buttocks abscess -Patient with h/o the same in 6/18 -Gram stain at that time showed few GNR and few GPC in clusters but culture was contaminated -He was also admitted (by me to [facility]) in 11/17 with presacral abscess and buttocks  infection, which required a transgluteal drain placement  -High suspicion for MRSA -Will observe and cover with Vancomycin -Abscess is adequately draining now and so will not consult surgery at this time - but given recurrent abscesses in this same region, it may be reasonable to reimage and consult surgery if ongoing intervention is desired -Depending on culture results, patient is eager for discharge tomorrow if possible and can go home with PICC and IV antibiotics through Hospice  Rectal cancer with mets to lung -Continue [place] O2 -He transitioned to hospice care in 11/17 and is no longer undergoing treatment -He is on significant opioids including MSER, MSIR, and PO Dilaudid -Will continue home medications in light of terminal prognosis -Will not add additional IV pain medications at this time   DVT prophylaxis: SCDs Code Status: DNR - confirmed with patient/Hospice nurse Family Communication: Hospice nurse present Disposition Plan:  Home once clinically improved Consults called: None  Admission status: It is my clinical opinion that referral for OBSERVATION is reasonable and necessary in this patient based on the above information provided. The aforementioned taken together are felt to place the patient at high risk for further clinical deterioration. However it is anticipated that the patient may be medically stable for discharge from the hospital within 24 to 48 hours.    Karmen Bongo MD [facility]  If note is complete, please contact covering daytime or nighttime physician. [email or web address] Password [facility]  12/15/2016, 8:42 PM

## 2016-12-15 NOTE — ED Notes (Signed)
Patient linen changed, abd pad placed on abscess. Hospitalist at bedside.

## 2016-12-15 NOTE — ED Triage Notes (Signed)
Pt c/o abscess to left buttocks-worse since yesterday.

## 2016-12-15 NOTE — Progress Notes (Signed)
Pharmacy Antibiotic Note  Andrew Kline is a 51 y.o. male admitted on 12/15/2016 with Wound Infection.  Pharmacy has been consulted for Vancomycin dosing.  Initial dose given in ED, Hx elevated Vancomycin trough last admission on Vancomycin 1250mg  IV every 12 hours.  Plan: Vancomycin 1gm IV every 12 hours.  Goal trough 10-15 mcg/mL.  Monitor labs, micro and vitals.    Height: 5\' 5"  (165.1 cm) Weight: 190 lb (86.2 kg) IBW/kg (Calculated) : 61.5  Temp (24hrs), Avg:98.7 F (37.1 C), Min:98.7 F (37.1 C), Max:98.7 F (37.1 C)   Recent Labs Lab 12/15/16 1718  WBC 16.6*  CREATININE 1.13    Estimated Creatinine Clearance: 76.3 mL/min (by C-G formula based on SCr of 1.13 mg/dL).    Allergies  Allergen Reactions  . Oxaliplatin Itching    Antimicrobials this admission: Vanc 8/16 >>   Dose adjustments this admission: n/a   Microbiology results:  BCx:   UCx:   8/16 Specimen: pending   MRSA PCR:   Thank you for allowing pharmacy to be a part of this patient's care.  Pricilla Larsson 12/15/2016 9:42 PM

## 2016-12-16 DIAGNOSIS — Z933 Colostomy status: Secondary | ICD-10-CM | POA: Diagnosis not present

## 2016-12-16 DIAGNOSIS — Z79899 Other long term (current) drug therapy: Secondary | ICD-10-CM | POA: Diagnosis not present

## 2016-12-16 DIAGNOSIS — Z87442 Personal history of urinary calculi: Secondary | ICD-10-CM | POA: Diagnosis not present

## 2016-12-16 DIAGNOSIS — C20 Malignant neoplasm of rectum: Secondary | ICD-10-CM | POA: Diagnosis present

## 2016-12-16 DIAGNOSIS — C7800 Secondary malignant neoplasm of unspecified lung: Secondary | ICD-10-CM

## 2016-12-16 DIAGNOSIS — Z79891 Long term (current) use of opiate analgesic: Secondary | ICD-10-CM | POA: Diagnosis not present

## 2016-12-16 DIAGNOSIS — L03317 Cellulitis of buttock: Secondary | ICD-10-CM | POA: Diagnosis present

## 2016-12-16 DIAGNOSIS — L0231 Cutaneous abscess of buttock: Secondary | ICD-10-CM | POA: Diagnosis not present

## 2016-12-16 DIAGNOSIS — G8929 Other chronic pain: Secondary | ICD-10-CM | POA: Diagnosis present

## 2016-12-16 DIAGNOSIS — Z888 Allergy status to other drugs, medicaments and biological substances status: Secondary | ICD-10-CM | POA: Diagnosis not present

## 2016-12-16 DIAGNOSIS — Z515 Encounter for palliative care: Secondary | ICD-10-CM | POA: Diagnosis present

## 2016-12-16 DIAGNOSIS — Z66 Do not resuscitate: Secondary | ICD-10-CM | POA: Diagnosis present

## 2016-12-16 DIAGNOSIS — L0291 Cutaneous abscess, unspecified: Secondary | ICD-10-CM

## 2016-12-16 DIAGNOSIS — F329 Major depressive disorder, single episode, unspecified: Secondary | ICD-10-CM | POA: Diagnosis present

## 2016-12-16 DIAGNOSIS — F17210 Nicotine dependence, cigarettes, uncomplicated: Secondary | ICD-10-CM | POA: Diagnosis present

## 2016-12-16 LAB — CBC
HEMATOCRIT: 38.4 % — AB (ref 39.0–52.0)
HEMOGLOBIN: 12.2 g/dL — AB (ref 13.0–17.0)
MCH: 26.5 pg (ref 26.0–34.0)
MCHC: 31.8 g/dL (ref 30.0–36.0)
MCV: 83.3 fL (ref 78.0–100.0)
Platelets: 444 10*3/uL — ABNORMAL HIGH (ref 150–400)
RBC: 4.61 MIL/uL (ref 4.22–5.81)
RDW: 17.7 % — ABNORMAL HIGH (ref 11.5–15.5)
WBC: 11 10*3/uL — AB (ref 4.0–10.5)

## 2016-12-16 LAB — BASIC METABOLIC PANEL
ANION GAP: 8 (ref 5–15)
BUN: 9 mg/dL (ref 6–20)
CALCIUM: 8.3 mg/dL — AB (ref 8.9–10.3)
CO2: 29 mmol/L (ref 22–32)
Chloride: 98 mmol/L — ABNORMAL LOW (ref 101–111)
Creatinine, Ser: 1.02 mg/dL (ref 0.61–1.24)
GLUCOSE: 171 mg/dL — AB (ref 65–99)
POTASSIUM: 3.5 mmol/L (ref 3.5–5.1)
SODIUM: 135 mmol/L (ref 135–145)

## 2016-12-16 MED ORDER — PIPERACILLIN-TAZOBACTAM 3.375 G IVPB
3.3750 g | Freq: Three times a day (TID) | INTRAVENOUS | Status: DC
  Administered 2016-12-16 – 2016-12-17 (×4): 3.375 g via INTRAVENOUS
  Filled 2016-12-16 (×2): qty 50

## 2016-12-16 NOTE — Consult Note (Signed)
Reason for Consult: Left buttock cellulitis Referring Physician: Dr. Humphrey Rolls Castorena is an 51 y.o. male.  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 He is currently on home hospice. He states he was doing well until over the past few days he started having drainage from a left buttock abscess. He states he has had no fever or chills. The drainage has somewhat decreased. He suffers from chronic pain, but states the left buttock does feel better than when he did a few days ago.  Past Medical History:  Diagnosis Date  . Anxiety   . Arthritis   . Depression   . Hospice care   . Kidney stone 03/22/15   left  . Rectal cancer ([place])   . Rectal pain   . Renal cell cancer ([place])    2012.   [name] Shortness of breath dyspnea     Past Surgical History:  Procedure Laterality Date  . BIOPSY N/A 09/30/2014   Procedure: BIOPSY;  Surgeon: Danie Binder, MD;  Location: [facility];  Service: Endoscopy;  Laterality: N/A;  Anal Canal  . COLOSTOMY N/A 02/11/2015   Procedure: COLOSTOMY;  Surgeon: Aviva Signs, MD;  Location: [facility];  Service: General;  Laterality: N/A;  . FLEXIBLE SIGMOIDOSCOPY N/A 09/30/2014   Procedure: FLEXIBLE SIGMOIDOSCOPY;  Surgeon: Danie Binder, MD;  Location: [facility];  Service: Endoscopy;  Laterality: N/A;  . FLEXIBLE SIGMOIDOSCOPY N/A 10/17/2014   Procedure: FLEXIBLE SIGMOIDOSCOPY;  Surgeon: Danie Binder, MD;  [facility];  Service: Endoscopy;  Laterality: N/A;  1325  . HYPOSPADIAS CORRECTION    . IR CATHETER TUBE CHANGE  11/06/2016  . IR GENERIC HISTORICAL  12/17/2015   IR CATHETER TUBE CHANGE 12/17/2015 [facility]  . IR GENERIC HISTORICAL  01/27/2016   IR NEPHROSTOMY PLACEMENT RIGHT 01/27/2016 [facility]  . IR GENERIC HISTORICAL  01/27/2016   IR NEPHROSTOMY PLACEMENT LEFT  01/27/2016 [facility]  . IR GENERIC HISTORICAL  03/08/2016   IR NEPHROSTOMY EXCHANGE LEFT 03/08/2016 Greggory Keen, MD [facility]  . IR GENERIC HISTORICAL  03/08/2016   IR NEPHROSTOMY EXCHANGE RIGHT 03/08/2016 Greggory Keen, MD [facility]  . IR GENERIC HISTORICAL  03/07/2016   IR CATHETER TUBE CHANGE 03/07/2016 [facility]  . IR GENERIC HISTORICAL  03/28/2016   IR CATHETER TUBE CHANGE 03/28/2016 Corrie Mckusick, DO [facility]  . IR GENERIC HISTORICAL  03/28/2016   IR NEPHROSTOMY TUBE CHANGE 03/28/2016 Corrie Mckusick, DO [facility]  . IR GENERIC HISTORICAL  06/16/2016   IR CATHETER TUBE CHANGE 06/16/2016 Aletta Edouard, MD [facility]  . IR GENERIC HISTORICAL  06/16/2016   IR NEPHROSTOGRAM LEFT THRU EXISTING ACCESS 06/16/2016 Aletta Edouard, MD [facility]  . KNEE SURGERY Left    arthroscopy  . LAPAROSCOPIC PARTIAL NEPHRECTOMY  2012   right side  . LUNG BIOPSY Right   . PERINEAL PROCTECTOMY N/A 08/26/2015   Procedure:  PROCTECTOMY;  Surgeon: Aviva Signs, MD;  Location: [facility];  Service: General;  Laterality: N/A;  . RECTAL SURGERY      Family History  Problem Relation Age of Onset  . Healthy Mother   . Lung cancer Maternal Grandfather   . Colon cancer Maternal Uncle        age 17  . Diabetes Other     Social History:  reports that he has been smoking Cigarettes.  He has a 15.00 pack-year smoking history. He has never used smokeless tobacco. He reports that he drinks alcohol. He reports that he does not use drugs.  Allergies:  Allergies  Allergen Reactions  . Oxaliplatin Itching    Medications:  Scheduled: . amitriptyline  50 mg Oral QHS  . docusate sodium  100 mg Oral BID  . DULoxetine  60 mg Oral Daily  . morphine  15 mg Oral Q12H  . morphine  30 mg Oral BID  . pregabalin  150 mg Oral BID  . temazepam  15 mg Oral QHS    Results for orders placed or performed during the hospital encounter of 12/15/16 (from the past 48 hour(s))  CBC with Differential      Status: Abnormal   Collection Time: 12/15/16  5:18 PM  Result Value Ref Range   WBC 16.6 (H) 4.0 - 10.5 K/uL   RBC 4.70 4.22 - 5.81 MIL/uL   Hemoglobin 12.7 (L) 13.0 - 17.0 g/dL   HCT 38.7 (L) 39.0 - 52.0 %   MCV 82.3 78.0 - 100.0 fL   MCH 27.0 26.0 - 34.0 pg   MCHC 32.8 30.0 - 36.0 g/dL   RDW 17.5 (H) 11.5 - 15.5 %   Platelets 401 (H) 150 - 400 K/uL   Neutrophils Relative % 83 %   Neutro Abs 13.8 (H) 1.7 - 7.7 K/uL   Lymphocytes Relative 8 %   Lymphs Abs 1.2 0.7 - 4.0 K/uL   Monocytes Relative 8 %   Monocytes Absolute 1.4 (H) 0.1 - 1.0 K/uL   Eosinophils Relative 1 %   Eosinophils Absolute 0.2 0.0 - 0.7 K/uL   Basophils Relative 0 %   Basophils Absolute 0.0 0.0 - 0.1 K/uL  Basic metabolic panel     Status: Abnormal   Collection Time: 12/15/16  5:18 PM  Result Value Ref Range   Sodium 130 (L) 135 - 145 mmol/L   Potassium 3.5 3.5 - 5.1 mmol/L   Chloride 96 (L) 101 - 111 mmol/L   CO2 23 22 - 32 mmol/L   Glucose, Bld 115 (H) 65 - 99 mg/dL   BUN 8 6 - 20 mg/dL   Creatinine, Ser 1.13 0.61 - 1.24 mg/dL   Calcium 8.3 (L) 8.9 - 10.3 mg/dL   GFR calc non Af Amer >60 >60 mL/min   GFR calc Af Amer >60 >60 mL/min    Comment: (NOTE) The eGFR has been calculated using the CKD EPI equation. This calculation has not been validated in all clinical situations. eGFR's persistently <60 mL/min signify possible Chronic Kidney Disease.    Anion gap 11 5 - 15  Aerobic Culture (superficial specimen)     Status: None (Preliminary result)   Collection Time: 12/15/16  7:05 PM  Result Value Ref Range   Specimen Description BUTTOCKS    Special Requests NONE    Gram Stain      ABUNDANT WBC PRESENT, PREDOMINANTLY PMN ABUNDANT GRAM NEGATIVE RODS ABUNDANT GRAM POSITIVE COCCI IN PAIRS ABUNDANT GRAM POSITIVE RODS    Culture      CULTURE REINCUBATED FOR BETTER GROWTH Performed at [facility], [place] [address] [place], [place] [address]    Report Status PENDING   Basic metabolic panel      Status: Abnormal   Collection Time: 12/16/16  6:37 AM  Result Value Ref Range   Sodium 135 135 - 145 mmol/L   Potassium 3.5 3.5 -  5.1 mmol/L   Chloride 98 (L) 101 - 111 mmol/L   CO2 29 22 - 32 mmol/L   Glucose, Bld 171 (H) 65 - 99 mg/dL   BUN 9 6 - 20 mg/dL   Creatinine, Ser 1.02 0.61 - 1.24 mg/dL   Calcium 8.3 (L) 8.9 - 10.3 mg/dL   GFR calc non Af Amer >60 >60 mL/min   GFR calc Af Amer >60 >60 mL/min    Comment: (NOTE) The eGFR has been calculated using the CKD EPI equation. This calculation has not been validated in all clinical situations. eGFR's persistently <60 mL/min signify possible Chronic Kidney Disease.    Anion gap 8 5 - 15  CBC     Status: Abnormal   Collection Time: 12/16/16  6:37 AM  Result Value Ref Range   WBC 11.0 (H) 4.0 - 10.5 K/uL   RBC 4.61 4.22 - 5.81 MIL/uL   Hemoglobin 12.2 (L) 13.0 - 17.0 g/dL   HCT 38.4 (L) 39.0 - 52.0 %   MCV 83.3 78.0 - 100.0 fL   MCH 26.5 26.0 - 34.0 pg   MCHC 31.8 30.0 - 36.0 g/dL   RDW 17.7 (H) 11.5 - 15.5 %   Platelets 444 (H) 150 - 400 K/uL    No results found.  ROS:  Pertinent items are noted in HPI.  Blood pressure (!) 152/62, pulse 84, temperature 99.1 F (37.3 C), resp. rate 18, height _0  (1.651 m), weight 200 lb 9.9 oz (91 kg), SpO2 94 %. Physical Exam: Pleasant white male in no acute distress. Head is normocephalic, atraumatic Lungs clear auscultation with equal breath sounds bilaterally Heart examination reveals a regular rate and rhythm without S3, S4, murmurs Left buttock exam reveals an indurated and erythematous area with a central draining wound of clear yellow fluid.  White blood cell count now 11,000, down from 16.6.   Assessment/Plan: Impression: Left buttock abscess with cellulitis, resolving spontaneously. Leukocytosis resolving. Stage IV rectal cancer, currently in hospice Plan: Would continue IV vancomycin. As the wound is already draining, we will monitor over the next 24 hours. Will follow  closely with you.Aviva Signs 12/16/2016, 11:11 AM

## 2016-12-16 NOTE — Progress Notes (Signed)
PROGRESS NOTE    Andrew Kline  UUV:[id number] DOB: [birth year] DOA: 12/15/2016 PCP: Marco Collie, MD     Brief Narrative: 51 y.o. male with medical history significant of remote renal cell cancer and now stage IV rectal cancer enrolled in Hospice presenting with a left buttocks asbcess   Assessment & Plan:    Left buttocks abscess -Patient with h/o the same in 6/18. -Gram stain shows G- rods and G+ cocci . -He was also admitted (by me to [facility]) in 11/17 with presacral abscess and buttocks infection, which required a transgluteal drain placement . -continue IV Vanc and Add Zosyn , surgery cslt as he needs I/D already placed.  Rectal cancer with mets to lung -Continue [place] O2 -He transitioned to hospice care in 11/17 and is no longer undergoing treatment -He is on significant opioids including MSER, MSIR, and PO Dilaudid -Will continue home medications in light of terminal prognosis   DVT prophylaxis: (SCD  Code Status: DNR Family Communication: (PATIENT  Disposition Plan: HOME   Consultants:   SURGERY     Procedures:   Antimicrobials:  Zosyn and Vanc .  Subjective: Pain in the buttock , denies any fevers or chills,puss is coming out actively .  Objective: Vitals:   12/15/16 1932 12/15/16 2000 12/15/16 2030 12/16/16 0500  BP: (!) 154/77 105/69 (!) 105/49 (!) 152/62  Pulse: 93 99 87 84  Resp:   18 18  Temp:   98.6 F (37 C) 99.1 F (37.3 C)  TempSrc:   Oral   SpO2: 97% 93% 94%   Weight:    91 kg (200 lb 9.9 oz)  Height:    5\' 5"  (1.651 m)    Intake/Output Summary (Last 24 hours) at 12/16/16 0922 Last data filed at 12/16/16 0600  Gross per 24 hour  Intake           768.33 ml  Output             1400 ml  Net          -631.67 ml   Filed Weights   12/15/16 1418 12/16/16 0500  Weight: 86.2 kg (190 lb) 91 kg (200 lb 9.9 oz)    Examination:  General exam: Appears calm and comfortable  Respiratory system: Clear to auscultation. Respiratory effort  normal. Cardiovascular system: S1 & S2 heard, RRR. No JVD, murmurs, rubs, gallops or clicks. No pedal edema. Gastrointestinal system: Abdomen is nondistended, soft and nontender. No organomegaly or masses felt. Normal bowel sounds heard. Central nervous system: Alert and oriented. No focal neurological deficits. Extremities: Symmetric 5 x 5 power. Skin: full mature abscess in the left Buttcok area. Psychiatry: Judgement and insight appear normal. Mood & affect appropriate.     Data Reviewed: I have personally reviewed following labs and imaging studies  CBC:  Recent Labs Lab 12/15/16 1718 12/16/16 0637  WBC 16.6* 11.0*  NEUTROABS 13.8*  --   HGB 12.7* 12.2*  HCT 38.7* 38.4*  MCV 82.3 83.3  PLT 401* 474*   Basic Metabolic Panel:  Recent Labs Lab 12/15/16 1718 12/16/16 0637  NA 130* 135  K 3.5 3.5  CL 96* 98*  CO2 23 29  GLUCOSE 115* 171*  BUN 8 9  CREATININE 1.13 1.02  CALCIUM 8.3* 8.3*   GFR: Estimated Creatinine Clearance: 86.8 mL/min (by C-G formula based on SCr of 1.02 mg/dL). Liver Function Tests: No results for input(s): AST, ALT, ALKPHOS, BILITOT, PROT, ALBUMIN in the last 168 hours. No results for input(s):  LIPASE, AMYLASE in the last 168 hours. No results for input(s): AMMONIA in the last 168 hours. Coagulation Profile: No results for input(s): INR, PROTIME in the last 168 hours. Cardiac Enzymes: No results for input(s): CKTOTAL, CKMB, CKMBINDEX, TROPONINI in the last 168 hours. BNP (last 3 results) No results for input(s): PROBNP in the last 8760 hours. HbA1C: No results for input(s): HGBA1C in the last 72 hours. CBG: No results for input(s): GLUCAP in the last 168 hours. Lipid Profile: No results for input(s): CHOL, HDL, LDLCALC, TRIG, CHOLHDL, LDLDIRECT in the last 72 hours. Thyroid Function Tests: No results for input(s): TSH, T4TOTAL, FREET4, T3FREE, THYROIDAB in the last 72 hours. Anemia Panel: No results for input(s): VITAMINB12, FOLATE,  FERRITIN, TIBC, IRON, RETICCTPCT in the last 72 hours. Urine analysis:    Component Value Date/Time   COLORURINE YELLOW 03/28/2016 0048   APPEARANCEUR HAZY (A) 03/28/2016 0048   LABSPEC 1.012 03/28/2016 0048   PHURINE 6.0 03/28/2016 0048   GLUCOSEU NEGATIVE 03/28/2016 0048   HGBUR MODERATE (A) 03/28/2016 0048   BILIRUBINUR NEGATIVE 03/28/2016 0048   KETONESUR NEGATIVE 03/28/2016 0048   PROTEINUR 100 (A) 03/28/2016 0048   UROBILINOGEN 0.2 03/17/2015 0910   NITRITE NEGATIVE 03/28/2016 0048   LEUKOCYTESUR MODERATE (A) 03/28/2016 0048   Sepsis Labs: @LABRCNTIP (procalcitonin:4,lacticidven:4)  ) Recent Results (from the past 240 hour(s))  Aerobic Culture (superficial specimen)     Status: None (Preliminary result)   Collection Time: 12/15/16  7:05 PM  Result Value Ref Range Status   Specimen Description BUTTOCKS  Final   Special Requests NONE  Final   Gram Stain   Final    ABUNDANT WBC PRESENT, PREDOMINANTLY PMN ABUNDANT GRAM NEGATIVE RODS ABUNDANT GRAM POSITIVE COCCI IN PAIRS ABUNDANT GRAM POSITIVE RODS Performed at [facility], [place] [address] [place], [place] [address]    Culture PENDING  Incomplete   Report Status PENDING  Incomplete         Radiology Studies: No results found.      Scheduled Meds: . amitriptyline  50 mg Oral QHS  . docusate sodium  100 mg Oral BID  . DULoxetine  60 mg Oral Daily  . morphine  15 mg Oral Q12H  . morphine  30 mg Oral BID  . pregabalin  150 mg Oral BID  . temazepam  15 mg Oral QHS   Continuous Infusions: . sodium chloride 100 mL/hr at 12/16/16 0600  . piperacillin-tazobactam 3.375 g (12/16/16 0915)  . vancomycin       LOS: 0 days    Time spent: [place], MD [facility] Pager [phone]  If 7PM-7AM, please contact night-coverage [email or web address] Password [email or web address] 12/16/2016, 9:22 AM

## 2016-12-16 NOTE — Care Management Note (Signed)
Case Management Note  Patient Details  Name: Andrew Kline MRN: [id number] Date of Birth: November 17, 1965  Subjective/Objective:                  Admitted with abscess. From home with family. Pt has oxygen, hospital bed and WC at home pta. He is active with [facility]. Pt anxious to DC home.   Action/Plan: [facility] aware of admission. They will resume services at DC. RN should notify [facility] of DC.  Expected Discharge Date:       12/17/2016           Expected Discharge Plan:  Home w Hospice Care  In-House Referral:  NA  Discharge planning Services  CM Consult  Post Acute Care Choice:  Hospice, Resumption of Svcs/PTA Provider Choice offered to:  Patient  Status of Service:  In process, will continue to follow   Sherald Barge, RN 12/16/2016, 12:52 PM

## 2016-12-16 NOTE — Consult Note (Addendum)
Consult requested for buttock wound and colostomy.  Pt is familiar to the [facility] team and had colostomy surgery in 10/16.  Supplies ordered to the bedside for staff nurse use.  Pt has a buttock wound related to an abscess.  Refer to surgical note from Dr Arnoldo Morale on 09/30/16.  This location previously drained spontaneously and had packing applied.  Pt is being followed by hospice.  WOC will plan to assess the wound appearance via remote camera at 11:30 with coordination from the bedside nurse. Moist gauze packing orders provided.   Post note: 1130:  Surgical team has assessed the wound and EMR indicates they will follow.  Please refer to their team for further plan of care or questions. Please re-consult if further assistance is needed.  Thank-you,  Julien Girt MSN, [place], [place], [place], [place]

## 2016-12-17 LAB — CBC
HEMATOCRIT: 35.7 % — AB (ref 39.0–52.0)
Hemoglobin: 11.2 g/dL — ABNORMAL LOW (ref 13.0–17.0)
MCH: 26.8 pg (ref 26.0–34.0)
MCHC: 31.4 g/dL (ref 30.0–36.0)
MCV: 85.4 fL (ref 78.0–100.0)
PLATELETS: 435 10*3/uL — AB (ref 150–400)
RBC: 4.18 MIL/uL — ABNORMAL LOW (ref 4.22–5.81)
RDW: 18.1 % — AB (ref 11.5–15.5)
WBC: 8.6 10*3/uL (ref 4.0–10.5)

## 2016-12-17 LAB — COMPREHENSIVE METABOLIC PANEL
ALT: 16 U/L — ABNORMAL LOW (ref 17–63)
ANION GAP: 7 (ref 5–15)
AST: 18 U/L (ref 15–41)
Albumin: 2.2 g/dL — ABNORMAL LOW (ref 3.5–5.0)
Alkaline Phosphatase: 157 U/L — ABNORMAL HIGH (ref 38–126)
BILIRUBIN TOTAL: 0.3 mg/dL (ref 0.3–1.2)
BUN: 11 mg/dL (ref 6–20)
CO2: 30 mmol/L (ref 22–32)
Calcium: 8 mg/dL — ABNORMAL LOW (ref 8.9–10.3)
Chloride: 101 mmol/L (ref 101–111)
Creatinine, Ser: 0.97 mg/dL (ref 0.61–1.24)
GFR calc Af Amer: >60 mL/min (ref >60)
GLUCOSE: 123 mg/dL — AB (ref 65–99)
POTASSIUM: 3.7 mmol/L (ref 3.5–5.1)
Sodium: 138 mmol/L (ref 135–145)
TOTAL PROTEIN: 6.5 g/dL (ref 6.5–8.1)

## 2016-12-17 MED ORDER — SULFAMETHOXAZOLE-TRIMETHOPRIM 800-160 MG PO TABS: 1.0000 | ORAL_TABLET | Freq: Two times a day (BID) | ORAL | 0 refills | Status: DC

## 2016-12-17 NOTE — Discharge Summary (Signed)
Physician Discharge Summary  Hilery Wintle KGY:[id number] DOB: 07/24/1965 DOA: 12/15/2016  PCP: Marco Collie, MD  Admit date: 12/15/2016 Discharge date: 12/17/2016  Admitted From: Home Disposition:  (Home)   Recommendations for Outpatient Follow-up:   1. Follow up with PCP in 1-2 weeks   Home Health:  NO Equipment/Devices: NONE  Discharge Condition:(Stable)  CODE STATUS: DNR  Diet recommendation:REGULAR   Brief/Interim Summary:    51 y.o. male with medical history significant of remote renal cell cancer and now stage IV rectal cancer enrolled in Hospice presenting with a left buttocks asbcess.  He "noticed a pus-filled pocket in my ass, apparently".  It started hurting mid-day yesterday and today it was excruciating.  It was completely fine until yesterday.  He did have a h/o buttocks abscess in the same place in June - it was drained and he was admitted to the hospital for 2-3 days.   He was admitted and started on wide spectrum IV Abx with subsequent improvement in his erythema , abscess already matured with an opening that drained pus , surgery recommended outpt follow up.  Discharge Diagnoses:   Left buttocks abscess drained by its own with small opening . -Patient with h/o the same in 6/18. -Gram stain shows G- rods and G+ cocci . -He was also admitted  in 11/17 with presacral abscess and buttocks infection, which required a transgluteal drain placement . - Given  IV Vanc and Add Zosyn ,switched to oral Bactrim and discharge to follow up as an outpt .  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 -He transitioned to hospice care in 11/17 and is no longer undergoing treatment -He is on significant opioids including MSER, MSIR, and PO Dilaudid -Will continue home medications in light of terminal prognosis    Discharge Instructions  Discharge Instructions    Diet - low sodium heart healthy    Complete by:  As directed    Increase activity slowly    Complete  by:  As directed      Allergies as of 12/17/2016      Reactions   Oxaliplatin Itching      Medication List    TAKE these medications   amitriptyline 50 MG tablet Commonly known as:  ELAVIL TAKE ONE TABLET BY MOUTH AT BEDTIME.   DULoxetine 60 MG capsule Commonly known as:  CYMBALTA Take 1 capsule (60 mg total) by mouth daily.   HYDROmorphone 4 MG tablet Commonly known as:  DILAUDID Take 4 mg by mouth every 6 (six) hours as needed (breakthrough pain).   Morphine Sulfate ER 15 MG T12a Take 15 mg by mouth See admin instructions. Take 1 tablet (15 mg) by mouth twice daily - with a 30 mg tablet for a 45 mg dose   morphine 30 MG tablet Commonly known as:  MSIR Take 30 mg by mouth 2 (two) times daily. Take 1 tablet by mouth twice a day with 15mg  ER   OXYGEN Inhale 4 L into the lungs as needed (shortness of breath).   pregabalin 150 MG capsule Commonly known as:  LYRICA Take 150 mg by mouth 2 (two) times daily.   sulfamethoxazole-trimethoprim 800-160 MG tablet Commonly known as:  BACTRIM DS,SEPTRA DS Take 1 tablet by mouth 2 (two) times daily.   temazepam 15 MG capsule Commonly known as:  RESTORIL Take 15 mg by mouth at bedtime.      Follow-up Information    Aviva Signs, MD. Call in 1 week(s).   Specialty:  General Surgery Contact information: [address] [facility] [address] [phone]          Allergies  Allergen Reactions  . Oxaliplatin Itching    Consultations:  Surgery .   Procedures/Studies:  No results found. (Echo, Carotid, EGD, Colonoscopy, ERCP)    Subjective:   Discharge Exam: Vitals:   12/16/16 2100 12/17/16 0500  BP: 105/62 (!) 112/54  Pulse: 90 81  Resp: 20 18  Temp: 98.1 F (36.7 C) 98.6 F (37 C)  SpO2: 100% 100%   Vitals:   12/16/16 0500 12/16/16 1414 12/16/16 2100 12/17/16 0500  BP: (!) 152/62 108/62 105/62 (!) 112/54  Pulse: 84 75 90 81  Resp: 18 16 20 18   Temp: 99.1 F (37.3 C) 98 F (36.7 C)  98.1 F (36.7 C) 98.6 F (37 C)  TempSrc:  Oral Oral Oral  SpO2:  94% 100% 100%  Weight: 91 kg (200 lb 9.9 oz)   94 kg (207 lb 3.2 oz)  Height: 5\' 5"  (1.651 m)       General: Pt is alert, awake, not in acute distress Cardiovascular: RRR, S1/S2 +, no rubs, no gallops Respiratory: CTA bilaterally, no wheezing, no rhonchi Abdominal: Soft, NT, ND, bowel sounds + Extremities: no edema, no cyanosis    The results of significant diagnostics from this hospitalization (including imaging, microbiology, ancillary and laboratory) are listed below for reference.     Microbiology: Recent Results (from the past 240 hour(s))  Aerobic Culture (superficial specimen)     Status: None (Preliminary result)   Collection Time: 12/15/16  7:05 PM  Result Value Ref Range Status   Specimen Description BUTTOCKS  Final   Special Requests NONE  Final   Gram Stain   Final    ABUNDANT WBC PRESENT, PREDOMINANTLY PMN ABUNDANT GRAM NEGATIVE RODS ABUNDANT GRAM POSITIVE COCCI IN PAIRS ABUNDANT GRAM POSITIVE RODS Performed at [facility], [address] [place], [place] [address]    Culture ABUNDANT STREPTOCOCCUS GROUP C  Final   Report Status PENDING  Incomplete     Labs: BNP (last 3 results) No results for input(s): BNP in the last 8760 hours. Basic Metabolic Panel:  Recent Labs Lab 12/15/16 1718 12/16/16 0637 12/17/16 0530  NA 130* 135 138  K 3.5 3.5 3.7  CL 96* 98* 101  CO2 23 29 30   GLUCOSE 115* 171* 123*  BUN 8 9 11   CREATININE 1.13 1.02 0.97  CALCIUM 8.3* 8.3* 8.0*   Liver Function Tests:  Recent Labs Lab 12/17/16 0530  AST 18  ALT 16*  ALKPHOS 157*  BILITOT 0.3  PROT 6.5  ALBUMIN 2.2*   No results for input(s): LIPASE, AMYLASE in the last 168 hours. No results for input(s): AMMONIA in the last 168 hours. CBC:  Recent Labs Lab 12/15/16 1718 12/16/16 0637 12/17/16 0530  WBC 16.6* 11.0* 8.6  NEUTROABS 13.8*  --   --   HGB 12.7* 12.2* 11.2*  HCT 38.7* 38.4*  35.7*  MCV 82.3 83.3 85.4  PLT 401* 444* 435*   Cardiac Enzymes: No results for input(s): CKTOTAL, CKMB, CKMBINDEX, TROPONINI in the last 168 hours. BNP: Invalid input(s): POCBNP CBG: No results for input(s): GLUCAP in the last 168 hours. D-Dimer No results for input(s): DDIMER in the last 72 hours. Hgb A1c No results for input(s): HGBA1C in the last 72 hours. Lipid Profile No results for input(s): CHOL, HDL, LDLCALC, TRIG, CHOLHDL, LDLDIRECT in the last 72 hours. Thyroid function studies No results for input(s):  TSH, T4TOTAL, T3FREE, THYROIDAB in the last 72 hours.  Invalid input(s): FREET3 Anemia work up No results for input(s): VITAMINB12, FOLATE, FERRITIN, TIBC, IRON, RETICCTPCT in the last 72 hours. Urinalysis    Component Value Date/Time   COLORURINE YELLOW 03/28/2016 0048   APPEARANCEUR HAZY (A) 03/28/2016 0048   LABSPEC 1.012 03/28/2016 0048   PHURINE 6.0 03/28/2016 0048   GLUCOSEU NEGATIVE 03/28/2016 0048   HGBUR MODERATE (A) 03/28/2016 0048   BILIRUBINUR NEGATIVE 03/28/2016 0048   KETONESUR NEGATIVE 03/28/2016 0048   PROTEINUR 100 (A) 03/28/2016 0048   UROBILINOGEN 0.2 03/17/2015 0910   NITRITE NEGATIVE 03/28/2016 0048   LEUKOCYTESUR MODERATE (A) 03/28/2016 0048   Sepsis Labs Invalid input(s): PROCALCITONIN,  WBC,  LACTICIDVEN Microbiology Recent Results (from the past 240 hour(s))  Aerobic Culture (superficial specimen)     Status: None (Preliminary result)   Collection Time: 12/15/16  7:05 PM  Result Value Ref Range Status   Specimen Description BUTTOCKS  Final   Special Requests NONE  Final   Gram Stain   Final    ABUNDANT WBC PRESENT, PREDOMINANTLY PMN ABUNDANT GRAM NEGATIVE RODS ABUNDANT GRAM POSITIVE COCCI IN PAIRS ABUNDANT GRAM POSITIVE RODS Performed at [facility], [address], [place], [place] [address]    Culture ABUNDANT STREPTOCOCCUS GROUP C  Final   Report Status PENDING  Incomplete     Time coordinating discharge: Over 30  minutes  SIGNED:   Waldron Session, MD  [facility] 12/17/2016, 10:10 AM Pager   If 7PM-7AM, please contact night-coverage [email or web address] Password [email or web address]

## 2016-12-17 NOTE — Progress Notes (Signed)
  Subjective: Patient feels much better and would like to be discharged. Drainage has significantly decreased.  Objective: Vital signs in last 24 hours: Temp:  [98 F (36.7 C)-98.6 F (37 C)] 98.6 F (37 C) (08/18 0500) Pulse Rate:  [75-90] 81 (08/18 0500) Resp:  [16-20] 18 (08/18 0500) BP: (105-112)/(54-62) 112/54 (08/18 0500) SpO2:  [94 %-100 %] 100 % (08/18 0500) Weight:  [207 lb 3.2 oz (94 kg)] 207 lb 3.2 oz (94 kg) (08/18 0500) Last BM Date: 12/16/16  Intake/Output from previous day: 08/17 0701 - 08/18 0700 In: 1795.8 [P.O.:720; I.V.:825.8; IV Piggyback:250] Out: 1150 [Urine:1150] Intake/Output this shift: Total I/O In: -  Out: 250 [Urine:250]  General appearance: alert, cooperative and no distress Left buttock wound significantly improved with less induration and erythema. Serous drainage noted. Small amount.  Lab Results:   Recent Labs  12/16/16 0637 12/17/16 0530  WBC 11.0* 8.6  HGB 12.2* 11.2*  HCT 38.4* 35.7*  PLT 444* 435*   BMET  Recent Labs  12/16/16 0637 12/17/16 0530  NA 135 138  K 3.5 3.7  CL 98* 101  CO2 29 30  GLUCOSE 171* 123*  BUN 9 11  CREATININE 1.02 0.97  CALCIUM 8.3* 8.0*   PT/INR No results for input(s): LABPROT, INR in the last 72 hours.  Studies/Results: No results found.  Anti-infectives: Anti-infectives    Start     Dose/Rate Route Frequency Ordered Stop   12/16/16 0800  vancomycin (VANCOCIN) IVPB 1000 mg/200 mL premix     1,000 mg 200 mL/hr over 60 Minutes Intravenous Every 12 hours 12/15/16 2156     12/16/16 0800  piperacillin-tazobactam (ZOSYN) IVPB 3.375 g     3.375 g 12.5 mL/hr over 240 Minutes Intravenous Every 8 hours 12/16/16 0730     12/15/16 1915  vancomycin (VANCOCIN) IVPB 1000 mg/200 mL premix     1,000 mg 200 mL/hr over 60 Minutes Intravenous  Once 12/15/16 1901 12/15/16 2035      Assessment/Plan: Impression: Left buttock abscess, resolving Plan: Okay for discharge from surgery standpoint. Would  discharge home on Bactrim DS for 10 days. Patient will follow-up in my office next week.  LOS: 1 day    Aviva Signs 12/17/2016

## 2016-12-18 LAB — AEROBIC CULTURE  (SUPERFICIAL SPECIMEN)

## 2016-12-29 ENCOUNTER — Ambulatory Visit (INDEPENDENT_AMBULATORY_CARE_PROVIDER_SITE_OTHER): Payer: Self-pay | Admitting: General Surgery

## 2016-12-29 ENCOUNTER — Encounter: Payer: Self-pay | Admitting: General Surgery

## 2016-12-29 VITALS — BP 116/76 | HR 86 | Temp 98.2°F | Resp 18 | Ht 65.0 in | Wt 207.0 lb

## 2016-12-29 DIAGNOSIS — L03317 Cellulitis of buttock: Secondary | ICD-10-CM

## 2016-12-29 NOTE — Progress Notes (Signed)
Subjective:     Andrew Kline    Patient is here for follow-up hospital visit.  He had a left buttock abscess that has been draining since his discharge.  He has had multiple episodes of abscess in the perineum and buttock regions.  He is currently in hospice for metastatic rectal cancer.  He has finished his antibiotics. Objective:    BP 116/76   Pulse 86   Temp 98.2 F (36.8 C)   Resp 18   Ht 5\' 5"  (1.651 m)   Wt 207 lb (93.9 kg)   BMI 34.45 kg/m   General:  alert, cooperative and no distress    Left buttock with cellulitic and slightly inferior area along the upper left buttock, but no purulent drainage noted.  Serous drainage present.     Assessment:    Cellulitis, left buttock, resolving     Metastatic rectal carcinoma, currently in hospice   Plan:   I told the patient that should he develop any further episodes of drainage, he may contact me.  Follow-up as needed.

## 2016-12-30 ENCOUNTER — Encounter (HOSPITAL_COMMUNITY): Payer: Self-pay | Admitting: Oncology

## 2017-01-11 IMAGING — XA IR CATHETER TUBE CHANGE
3 series · 3 of 3 positions shown · non-contrast
Comparison: none

[Series 1: fl (-) angio · 1 of 1 slices shown (1 of 3)]
[im 1/1]
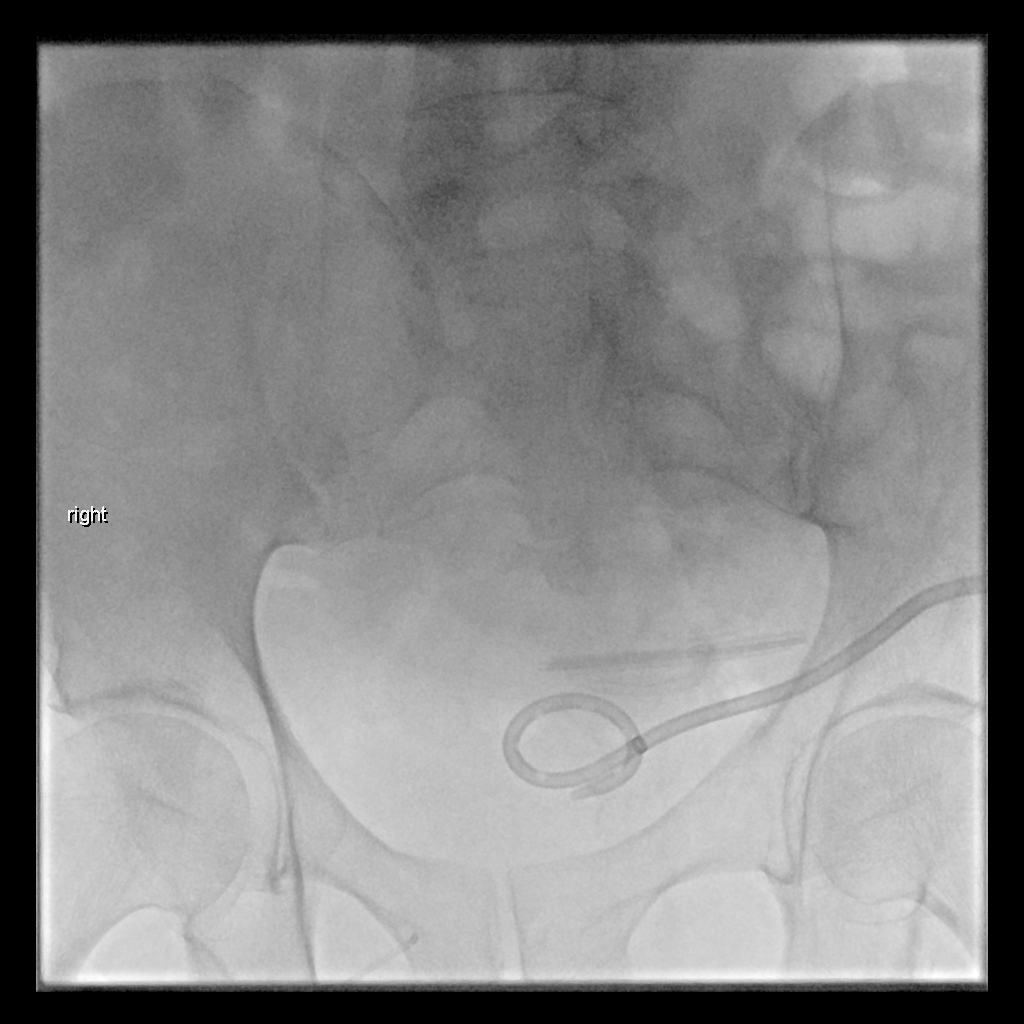

[Series 2: fl (-) angio · 1 of 1 slices shown (2 of 3)]
[im 1/1]
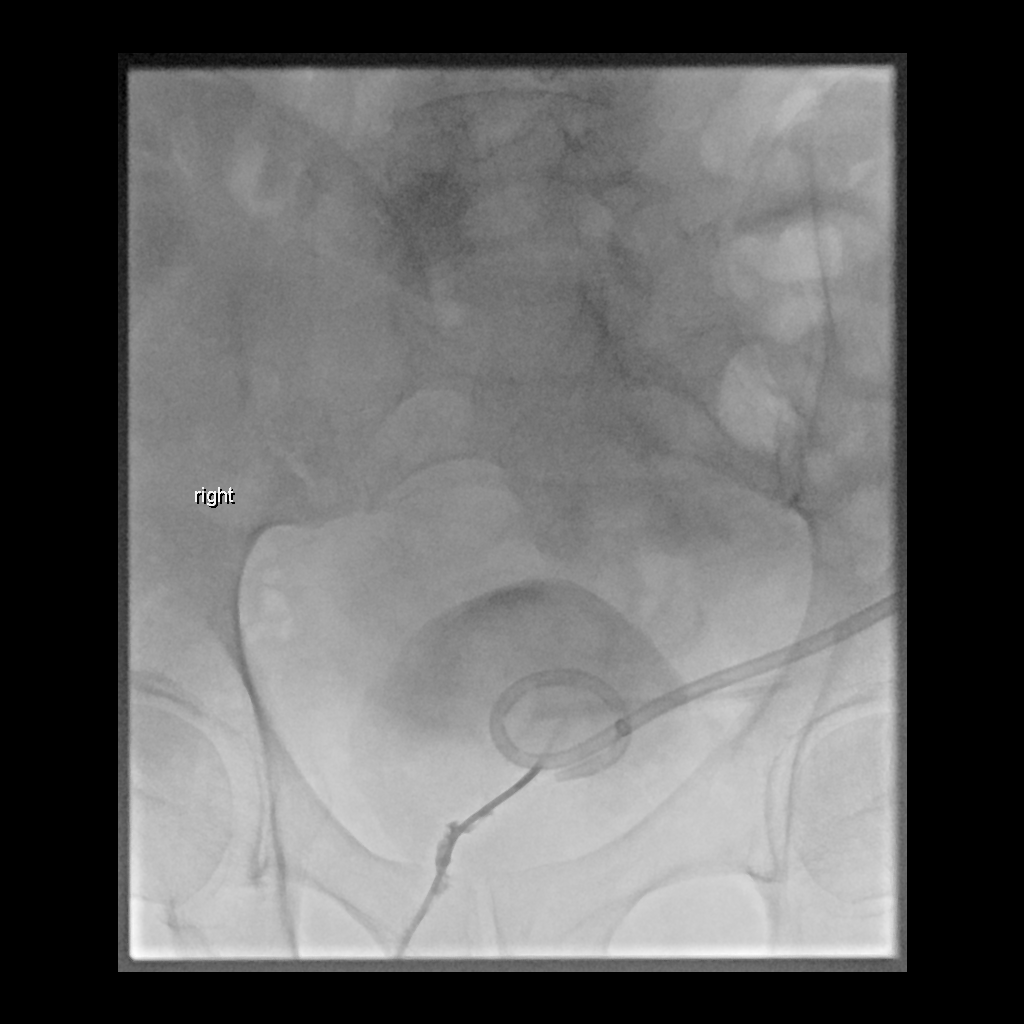

[Series 3: fl (-) angio · 1 of 1 slices shown (3 of 3)]
[im 1/1]
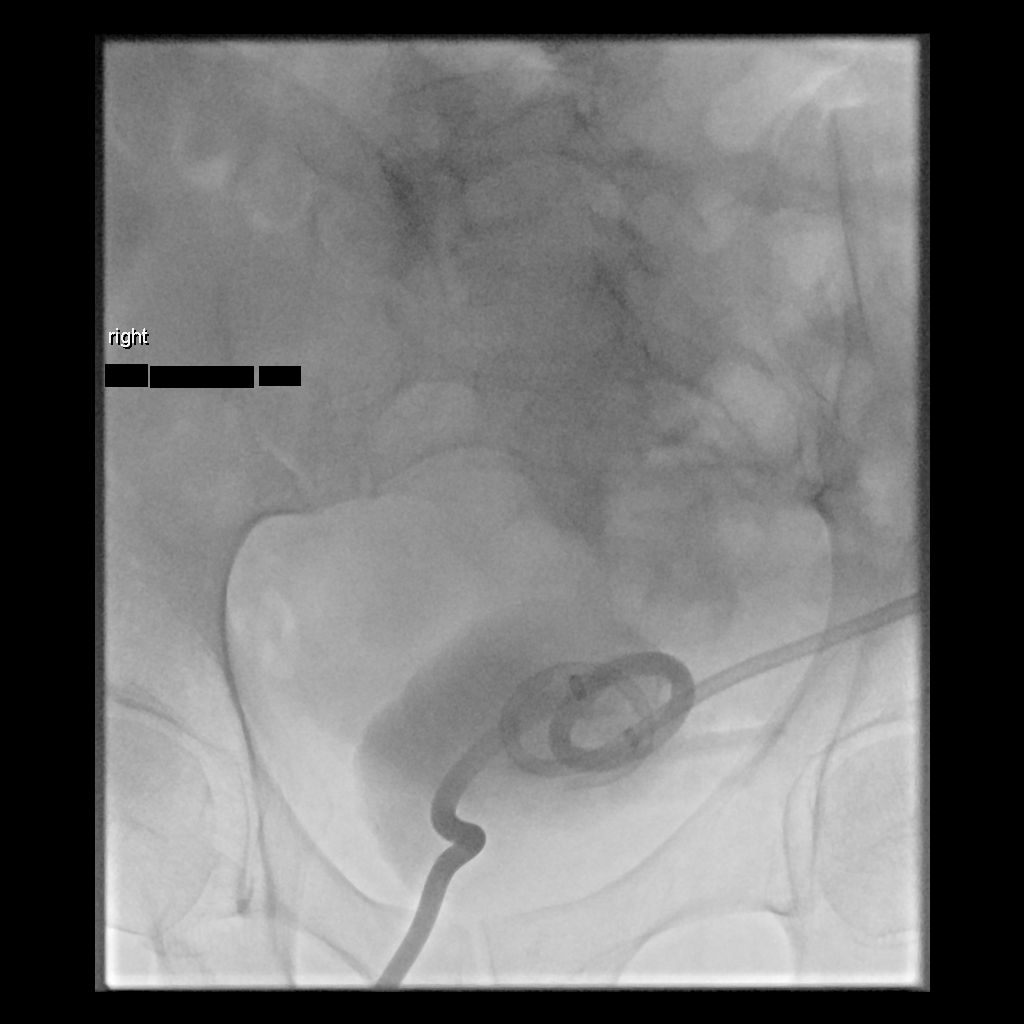

[3 of 3 positions shown; findings below may reference images not displayed]

This is an automatically generated report for an Evaluation and Management Service.
Refer to host system for full dictation.

## 2017-01-15 ENCOUNTER — Encounter (HOSPITAL_COMMUNITY): Payer: Self-pay

## 2017-01-15 ENCOUNTER — Emergency Department (HOSPITAL_COMMUNITY): Payer: Medicaid Other

## 2017-01-15 ENCOUNTER — Observation Stay (HOSPITAL_COMMUNITY)
Admission: EM | Admit: 2017-01-15 | Discharge: 2017-01-19 | Disposition: A | Discharge status: Home / Self Care | Payer: Medicaid Other | Attending: Internal Medicine | Admitting: Internal Medicine

## 2017-01-15 DIAGNOSIS — K435 Parastomal hernia without obstruction or  gangrene: Secondary | ICD-10-CM | POA: Insufficient documentation

## 2017-01-15 DIAGNOSIS — R05 Cough: Secondary | ICD-10-CM | POA: Diagnosis not present

## 2017-01-15 DIAGNOSIS — Z85048 Personal history of other malignant neoplasm of rectum, rectosigmoid junction, and anus: Secondary | ICD-10-CM | POA: Insufficient documentation

## 2017-01-15 DIAGNOSIS — Z79899 Other long term (current) drug therapy: Secondary | ICD-10-CM | POA: Insufficient documentation

## 2017-01-15 DIAGNOSIS — F17210 Nicotine dependence, cigarettes, uncomplicated: Secondary | ICD-10-CM | POA: Insufficient documentation

## 2017-01-15 DIAGNOSIS — F341 Dysthymic disorder: Secondary | ICD-10-CM | POA: Diagnosis not present

## 2017-01-15 DIAGNOSIS — C20 Malignant neoplasm of rectum: Secondary | ICD-10-CM | POA: Diagnosis present

## 2017-01-15 DIAGNOSIS — N1330 Unspecified hydronephrosis: Secondary | ICD-10-CM | POA: Diagnosis not present

## 2017-01-15 DIAGNOSIS — F418 Other specified anxiety disorders: Secondary | ICD-10-CM | POA: Diagnosis present

## 2017-01-15 DIAGNOSIS — Z85118 Personal history of other malignant neoplasm of bronchus and lung: Secondary | ICD-10-CM | POA: Insufficient documentation

## 2017-01-15 DIAGNOSIS — N390 Urinary tract infection, site not specified: Secondary | ICD-10-CM | POA: Diagnosis not present

## 2017-01-15 DIAGNOSIS — R1032 Left lower quadrant pain: Secondary | ICD-10-CM | POA: Diagnosis present

## 2017-01-15 DIAGNOSIS — R109 Unspecified abdominal pain: Secondary | ICD-10-CM | POA: Diagnosis present

## 2017-01-15 DIAGNOSIS — Y998 Other external cause status: Secondary | ICD-10-CM | POA: Diagnosis not present

## 2017-01-15 DIAGNOSIS — K651 Peritoneal abscess: Secondary | ICD-10-CM | POA: Diagnosis not present

## 2017-01-15 DIAGNOSIS — Y9389 Activity, other specified: Secondary | ICD-10-CM | POA: Diagnosis not present

## 2017-01-15 DIAGNOSIS — C799 Secondary malignant neoplasm of unspecified site: Secondary | ICD-10-CM | POA: Diagnosis present

## 2017-01-15 DIAGNOSIS — Y92019 Unspecified place in single-family (private) house as the place of occurrence of the external cause: Secondary | ICD-10-CM | POA: Diagnosis not present

## 2017-01-15 DIAGNOSIS — C7800 Secondary malignant neoplasm of unspecified lung: Secondary | ICD-10-CM

## 2017-01-15 HISTORY — DX: Other cystostomy status: Z93.59

## 2017-01-15 HISTORY — DX: Ventral hernia without obstruction or gangrene: K43.9

## 2017-01-15 LAB — URINALYSIS, ROUTINE W REFLEX MICROSCOPIC
BILIRUBIN URINE: NEGATIVE
Glucose, UA: NEGATIVE mg/dL
Ketones, ur: NEGATIVE mg/dL
Nitrite: POSITIVE — AB
SQUAMOUS EPITHELIAL / LPF: NONE SEEN
Specific Gravity, Urine: 1.024 (ref 1.005–1.030)
pH: 7 (ref 5.0–8.0)

## 2017-01-15 LAB — COMPREHENSIVE METABOLIC PANEL
ALK PHOS: 98 U/L (ref 38–126)
ALT: 11 U/L — AB (ref 17–63)
ANION GAP: 10 (ref 5–15)
AST: 14 U/L — ABNORMAL LOW (ref 15–41)
Albumin: 3.1 g/dL — ABNORMAL LOW (ref 3.5–5.0)
BILIRUBIN TOTAL: 0.3 mg/dL (ref 0.3–1.2)
BUN: 7 mg/dL (ref 6–20)
CALCIUM: 8.6 mg/dL — AB (ref 8.9–10.3)
CO2: 23 mmol/L (ref 22–32)
CREATININE: 0.92 mg/dL (ref 0.61–1.24)
Chloride: 104 mmol/L (ref 101–111)
GFR calc Af Amer: >60 mL/min (ref >60)
GFR calc non Af Amer: >60 mL/min (ref >60)
Glucose, Bld: 128 mg/dL — ABNORMAL HIGH (ref 65–99)
Potassium: 3.5 mmol/L (ref 3.5–5.1)
Sodium: 137 mmol/L (ref 135–145)
TOTAL PROTEIN: 7.5 g/dL (ref 6.5–8.1)

## 2017-01-15 LAB — CBC WITH DIFFERENTIAL/PLATELET
BASOS PCT: 0 %
Basophils Absolute: 0 10*3/uL (ref 0.0–0.1)
EOS PCT: 2 %
Eosinophils Absolute: 0.3 10*3/uL (ref 0.0–0.7)
HCT: 42.1 % (ref 39.0–52.0)
HEMOGLOBIN: 14.1 g/dL (ref 13.0–17.0)
Lymphocytes Relative: 10 %
Lymphs Abs: 1.5 10*3/uL (ref 0.7–4.0)
MCH: 27.8 pg (ref 26.0–34.0)
MCHC: 33.5 g/dL (ref 30.0–36.0)
MCV: 82.9 fL (ref 78.0–100.0)
MONOS PCT: 7 %
Monocytes Absolute: 1.1 10*3/uL — ABNORMAL HIGH (ref 0.1–1.0)
NEUTROS PCT: 81 %
Neutro Abs: 12.4 10*3/uL — ABNORMAL HIGH (ref 1.7–7.7)
PLATELETS: 413 10*3/uL — AB (ref 150–400)
RBC: 5.08 MIL/uL (ref 4.22–5.81)
RDW: 18.1 % — ABNORMAL HIGH (ref 11.5–15.5)
WBC: 15.3 10*3/uL — AB (ref 4.0–10.5)

## 2017-01-15 LAB — I-STAT CG4 LACTIC ACID, ED: LACTIC ACID, VENOUS: 0.61 mmol/L (ref 0.5–1.9)

## 2017-01-15 LAB — LIPASE, BLOOD: Lipase: 23 U/L (ref 11–51)

## 2017-01-15 MED ORDER — TEMAZEPAM 15 MG PO CAPS
15.0000 mg | ORAL_CAPSULE | Freq: Every day | ORAL | Status: DC
  Administered 2017-01-15 – 2017-01-18 (×4): 15 mg via ORAL
  Filled 2017-01-15 (×4): qty 1

## 2017-01-15 MED ORDER — FENTANYL CITRATE (PF) 100 MCG/2ML IJ SOLN
100.0000 ug | INTRAMUSCULAR | Status: AC | PRN
  Administered 2017-01-15 (×2): 100 ug via INTRAVENOUS
  Filled 2017-01-15 (×2): qty 2

## 2017-01-15 MED ORDER — PREGABALIN 75 MG PO CAPS
150.0000 mg | ORAL_CAPSULE | Freq: Two times a day (BID) | ORAL | Status: DC
  Administered 2017-01-15 – 2017-01-18 (×7): 150 mg via ORAL
  Filled 2017-01-15 (×8): qty 2

## 2017-01-15 MED ORDER — POLYETHYLENE GLYCOL 3350 17 G PO PACK: 17.0000 g | PACK | Freq: Every day | ORAL | Status: DC | PRN

## 2017-01-15 MED ORDER — ACETAMINOPHEN 650 MG RE SUPP: 650.0000 mg | Freq: Four times a day (QID) | RECTAL | Status: DC | PRN

## 2017-01-15 MED ORDER — IOPAMIDOL (ISOVUE-300) INJECTION 61%
100.0000 mL | Freq: Once | INTRAVENOUS | Status: AC | PRN
Start: 2017-01-15 — End: 2017-01-15
  Administered 2017-01-15: 100 mL via INTRAVENOUS

## 2017-01-15 MED ORDER — MORPHINE SULFATE ER 15 MG PO TBCR
15.0000 mg | EXTENDED_RELEASE_TABLET | Freq: Two times a day (BID) | ORAL | Status: DC
  Administered 2017-01-15 – 2017-01-18 (×7): 15 mg via ORAL
  Filled 2017-01-15 (×8): qty 1

## 2017-01-15 MED ORDER — ONDANSETRON HCL 4 MG/2ML IJ SOLN
4.0000 mg | INTRAMUSCULAR | Status: DC | PRN
  Administered 2017-01-15: 4 mg via INTRAVENOUS
  Filled 2017-01-15: qty 2

## 2017-01-15 MED ORDER — MORPHINE SULFATE 15 MG PO TABS
30.0000 mg | ORAL_TABLET | Freq: Two times a day (BID) | ORAL | Status: DC
  Administered 2017-01-15 – 2017-01-18 (×7): 30 mg via ORAL
  Filled 2017-01-15 (×8): qty 2

## 2017-01-15 MED ORDER — ACETAMINOPHEN 325 MG PO TABS: 650.0000 mg | ORAL_TABLET | Freq: Four times a day (QID) | ORAL | Status: DC | PRN

## 2017-01-15 MED ORDER — SODIUM CHLORIDE 0.9% FLUSH
3.0000 mL | Freq: Two times a day (BID) | INTRAVENOUS | Status: DC
  Administered 2017-01-15 – 2017-01-18 (×6): 3 mL via INTRAVENOUS

## 2017-01-15 MED ORDER — AMITRIPTYLINE HCL 25 MG PO TABS
50.0000 mg | ORAL_TABLET | Freq: Every day | ORAL | Status: DC
  Administered 2017-01-15 – 2017-01-18 (×4): 50 mg via ORAL
  Filled 2017-01-15 (×4): qty 2
  Filled 2017-01-15: qty 1
  Filled 2017-01-15: qty 2

## 2017-01-15 MED ORDER — DEXTROSE 5 % IV SOLN
1.0000 g | INTRAVENOUS | Status: DC
  Administered 2017-01-16: 1 g via INTRAVENOUS
  Filled 2017-01-15 (×2): qty 10

## 2017-01-15 MED ORDER — SODIUM CHLORIDE 0.9 % IV SOLN
INTRAVENOUS | Status: AC
  Administered 2017-01-15 – 2017-01-16 (×2): via INTRAVENOUS

## 2017-01-15 MED ORDER — HYDRALAZINE HCL 20 MG/ML IJ SOLN: 10.0000 mg | Freq: Three times a day (TID) | INTRAMUSCULAR | Status: DC | PRN

## 2017-01-15 MED ORDER — HYDROMORPHONE HCL 2 MG/ML IJ SOLN
2.0000 mg | INTRAMUSCULAR | Status: DC | PRN
  Administered 2017-01-16: 2 mg via INTRAVENOUS
  Filled 2017-01-15: qty 1

## 2017-01-15 MED ORDER — DULOXETINE HCL 60 MG PO CPEP
60.0000 mg | ORAL_CAPSULE | Freq: Every day | ORAL | Status: DC
  Administered 2017-01-15 – 2017-01-18 (×4): 60 mg via ORAL
  Filled 2017-01-15 (×5): qty 1

## 2017-01-15 MED ORDER — CEFTRIAXONE SODIUM 1 G IJ SOLR
1.0000 g | Freq: Once | INTRAMUSCULAR | Status: AC
  Administered 2017-01-15: 1 g via INTRAVENOUS
  Filled 2017-01-15: qty 10

## 2017-01-15 MED ORDER — MORPHINE SULFATE (PF) 4 MG/ML IV SOLN
4.0000 mg | INTRAVENOUS | Status: AC | PRN
  Administered 2017-01-15 – 2017-01-17 (×2): 4 mg via INTRAVENOUS
  Filled 2017-01-15 (×2): qty 1

## 2017-01-15 MED ORDER — MORPHINE SULFATE ER 15 MG PO T12A: 15.0000 mg | EXTENDED_RELEASE_TABLET | ORAL | Status: DC

## 2017-01-15 NOTE — ED Notes (Signed)
Pt in CT at this time.
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
## 2017-01-16 DIAGNOSIS — K651 Peritoneal abscess: Secondary | ICD-10-CM

## 2017-01-16 DIAGNOSIS — C801 Malignant (primary) neoplasm, unspecified: Secondary | ICD-10-CM

## 2017-01-16 DIAGNOSIS — K435 Parastomal hernia without obstruction or  gangrene: Secondary | ICD-10-CM

## 2017-01-16 DIAGNOSIS — F341 Dysthymic disorder: Secondary | ICD-10-CM

## 2017-01-16 DIAGNOSIS — C7800 Secondary malignant neoplasm of unspecified lung: Secondary | ICD-10-CM

## 2017-01-16 DIAGNOSIS — N390 Urinary tract infection, site not specified: Secondary | ICD-10-CM

## 2017-01-16 DIAGNOSIS — C20 Malignant neoplasm of rectum: Secondary | ICD-10-CM

## 2017-01-16 DIAGNOSIS — N1330 Unspecified hydronephrosis: Secondary | ICD-10-CM

## 2017-01-16 LAB — BASIC METABOLIC PANEL
Anion gap: 9 (ref 5–15)
BUN: 8 mg/dL (ref 6–20)
CALCIUM: 8.3 mg/dL — AB (ref 8.9–10.3)
CHLORIDE: 103 mmol/L (ref 101–111)
CO2: 25 mmol/L (ref 22–32)
CREATININE: 1.01 mg/dL (ref 0.61–1.24)
GFR calc Af Amer: >60 mL/min (ref >60)
Glucose, Bld: 137 mg/dL — ABNORMAL HIGH (ref 65–99)
Potassium: 3.7 mmol/L (ref 3.5–5.1)
Sodium: 137 mmol/L (ref 135–145)

## 2017-01-16 LAB — CBC
HCT: 41.9 % (ref 39.0–52.0)
Hemoglobin: 13.7 g/dL (ref 13.0–17.0)
MCH: 27.5 pg (ref 26.0–34.0)
MCHC: 32.7 g/dL (ref 30.0–36.0)
MCV: 84.1 fL (ref 78.0–100.0)
PLATELETS: 367 10*3/uL (ref 150–400)
RBC: 4.98 MIL/uL (ref 4.22–5.81)
RDW: 18.4 % — AB (ref 11.5–15.5)
WBC: 14.7 10*3/uL — AB (ref 4.0–10.5)

## 2017-01-16 MED ORDER — HYDROMORPHONE HCL 1 MG/ML IJ SOLN: 1.0000 mg | INTRAMUSCULAR | Status: DC | PRN

## 2017-01-16 MED ORDER — HYDROMORPHONE HCL 1 MG/ML IJ SOLN
0.5000 mg | INTRAMUSCULAR | Status: DC | PRN
  Administered 2017-01-16 – 2017-01-18 (×5): 0.5 mg via INTRAVENOUS
  Filled 2017-01-16 (×5): qty 1

## 2017-01-16 NOTE — Progress Notes (Signed)
Pt has been NPO since MN for testing that may occur via MD order on 01/16/17

## 2017-01-16 NOTE — Clinical Social Work Note (Signed)
Patient referred to CSW due to needing Medicaid. This is a function of the Development worker, community. LCSW left a message regarding patient on financial counselor's voicemail.    LCSW signing off.    Bettyjean Stefanski, Clydene Pugh, LCSW
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
## 2017-01-16 NOTE — Care Management Note (Addendum)
Case Management Note  Patient Details  Name: Andrew Kline MRN: [id number] Date of Birth: 11-16-1965  Subjective/Objective:                  Admitted with abdominal pain. Pt is from home, lives with family. He was recently DC'd from hospice services. He has no insurance, says his SSI disqualifies him for medicaid. He has no applied to disability. Pt has no PCP listed. Pt communicates no needs or concerns.   Action/Plan: CM plans to return home with self care. Plans to f/u with cancer center and surgery. Pt referred to [facility] to discuss disability process. CM will follow to DC. If DC'd on new medications, not narcotics or OTC medications he will qualify for [facility] voucher.  Expected Discharge Date:     01/17/2017             Expected Discharge Plan:  Home/Self Care  In-House Referral:  Clinical Social Work, Scientist, research (medical)  CM Consult  Post Acute Care Choice:  NA Choice offered to:  NA  Status of Service:  Completed, signed off  Sherald Barge, RN 01/16/2017, 1:09 PM
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
## 2017-01-16 NOTE — Progress Notes (Addendum)
Palliative Medicine RN Note: Consult order noted. PMT provider will return to [facility] on 9/19, and patient will be seen at that time. If you require immediate recommendations, please call our office at [phone] between the hours of 0700-1900.  Marjie Skiff Masey Scheiber, RN, BSN, [facility] 01/16/2017 9:39 AM Cell [phone] 8:00-4:00 Monday-Friday Office [phone]

## 2017-01-17 DIAGNOSIS — N1330 Unspecified hydronephrosis: Secondary | ICD-10-CM

## 2017-01-17 LAB — CBC
HCT: 39.9 % (ref 39.0–52.0)
Hemoglobin: 12.6 g/dL — ABNORMAL LOW (ref 13.0–17.0)
MCH: 26.6 pg (ref 26.0–34.0)
MCHC: 31.6 g/dL (ref 30.0–36.0)
MCV: 84.2 fL (ref 78.0–100.0)
PLATELETS: 315 10*3/uL (ref 150–400)
RBC: 4.74 MIL/uL (ref 4.22–5.81)
RDW: 18.3 % — AB (ref 11.5–15.5)
WBC: 14.9 10*3/uL — ABNORMAL HIGH (ref 4.0–10.5)

## 2017-01-17 LAB — BASIC METABOLIC PANEL
Anion gap: 7 (ref 5–15)
BUN: 9 mg/dL (ref 6–20)
CO2: 26 mmol/L (ref 22–32)
CREATININE: 1.03 mg/dL (ref 0.61–1.24)
Calcium: 8.3 mg/dL — ABNORMAL LOW (ref 8.9–10.3)
Chloride: 100 mmol/L — ABNORMAL LOW (ref 101–111)
GFR calc Af Amer: >60 mL/min (ref >60)
GLUCOSE: 185 mg/dL — AB (ref 65–99)
Potassium: 3.5 mmol/L (ref 3.5–5.1)
SODIUM: 133 mmol/L — AB (ref 135–145)

## 2017-01-17 LAB — URINE CULTURE

## 2017-01-17 MED ORDER — DIPHENHYDRAMINE HCL 25 MG PO CAPS
25.0000 mg | ORAL_CAPSULE | Freq: Four times a day (QID) | ORAL | Status: DC | PRN
Start: 2017-01-17 — End: 2017-01-19
  Administered 2017-01-18: 25 mg via ORAL
  Filled 2017-01-17: qty 1

## 2017-01-17 MED ORDER — POTASSIUM CHLORIDE IN NACL 20-0.9 MEQ/L-% IV SOLN
INTRAVENOUS | Status: DC
  Administered 2017-01-17 – 2017-01-18 (×2): via INTRAVENOUS

## 2017-01-17 NOTE — Progress Notes (Signed)
General Surgery  Patient doing well. Eating. Abd pain resolved for most part, having intermittent transient episodes of pain but back to his baseline.  Ostomy functioning.  Can follow up with Dr. Arnoldo Morale PRN.  Curlene Labrum, MD
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
## 2017-01-17 NOTE — Consult Note (Signed)
Urology Consult  Consulting MD: Keturah Barre Tat, MD  CC: Left hydronephrosis  HPI: This is a 51 year old male with history of advanced rectal cancer, now metastatic.  The patient has been seen by me before, last earlier this year for follow-up of a urethral rectal fistula/pelvic fluid collection.  The patient had a surgical injury which was repaired during his proctocolectomy.  However, he developed a fistula.  He has had a suprapubic tube in place since that time.  He eventually was treated with bilateral percutaneous nephrostomy tubes to try to dry up his urinary leakage into a pelvic cavity.  The patient desired removal of his nephrostomy tubes, which, after nephrostograms showed patency of his ureters, were both removed by February of this year.  He was admitted to the emergency room earlier this week because of abdominal pain.  CT scan revealed left hydronephrosis with thickened left ureteral/renal pelvic wall.  Urologic consultation is requested.  Patient has had some night sweats, but has not had any fever or chills.  He has not had a right nephrectomy.  He has both kidneys.  PMH: Past Medical History:  Diagnosis Date  . Anxiety   . Arthritis   . Depression   . Hospice care   . Kidney stone 03/22/15   left  . Rectal cancer ([place])   . Rectal pain   . Renal cell cancer ([place])    2012.   [name] Shortness of breath dyspnea   . Suprapubic catheter ([place])   . Ventral hernia    around ostomy site    PSH: Past Surgical History:  Procedure Laterality Date  . BIOPSY N/A 09/30/2014   Procedure: BIOPSY;  Surgeon: Danie Binder, MD;  Location: [facility];  Service: Endoscopy;  Laterality: N/A;  Anal Canal  . COLOSTOMY N/A 02/11/2015   Procedure: COLOSTOMY;  Surgeon: Aviva Signs, MD;  Location: [facility];  Service: General;  Laterality: N/A;  . FLEXIBLE SIGMOIDOSCOPY N/A 09/30/2014   Procedure: FLEXIBLE SIGMOIDOSCOPY;  Surgeon: Danie Binder, MD;  Location: [facility];  Service: Endoscopy;  Laterality: N/A;   . FLEXIBLE SIGMOIDOSCOPY N/A 10/17/2014   Procedure: FLEXIBLE SIGMOIDOSCOPY;  Surgeon: Danie Binder, MD;  Location: [facility];  Service: Endoscopy;  Laterality: N/A;  1325  . HYPOSPADIAS CORRECTION    . IR CATHETER TUBE CHANGE  11/06/2016  . IR GENERIC HISTORICAL  12/17/2015   IR CATHETER TUBE CHANGE 12/17/2015 [facility]  . IR GENERIC HISTORICAL  01/27/2016   IR NEPHROSTOMY PLACEMENT RIGHT 01/27/2016 [facility]  . IR GENERIC HISTORICAL  01/27/2016   IR NEPHROSTOMY PLACEMENT LEFT 01/27/2016 [facility]  . IR GENERIC HISTORICAL  03/08/2016   IR NEPHROSTOMY EXCHANGE LEFT 03/08/2016 Greggory Keen, MD [facility]  . IR GENERIC HISTORICAL  03/08/2016   IR NEPHROSTOMY EXCHANGE RIGHT 03/08/2016 Greggory Keen, MD [facility]  . IR GENERIC HISTORICAL  03/07/2016   IR CATHETER TUBE CHANGE 03/07/2016 [facility]  . IR GENERIC HISTORICAL  03/28/2016   IR CATHETER TUBE CHANGE 03/28/2016 Corrie Mckusick, DO [facility]  . IR GENERIC HISTORICAL  03/28/2016   IR NEPHROSTOMY TUBE CHANGE 03/28/2016 Corrie Mckusick, DO [facility]  . IR GENERIC HISTORICAL  06/16/2016   IR CATHETER TUBE CHANGE 06/16/2016 Aletta Edouard, MD [facility]  . IR GENERIC HISTORICAL  06/16/2016   IR NEPHROSTOGRAM LEFT THRU EXISTING ACCESS 06/16/2016 Aletta Edouard, MD [facility]  . KNEE SURGERY Left    arthroscopy  . [place] PARTIAL NEPHRECTOMY  2012  right side  . LUNG BIOPSY Right   . PERINEAL PROCTECTOMY N/A 08/26/2015   Procedure:  PROCTECTOMY;  Surgeon: Aviva Signs, MD;  Location: [facility];  Service: General;  Laterality: N/A;  . RECTAL SURGERY      Allergies: Allergies  Allergen Reactions  . Oxaliplatin Itching    Medications: Prescriptions Prior to Admission  Medication Sig Dispense Refill Last Dose  . amitriptyline (ELAVIL) 50 MG tablet TAKE ONE TABLET BY MOUTH AT BEDTIME. 15 tablet 0 01/14/2017 at Unknown time  . DULoxetine (CYMBALTA) 60 MG capsule Take 1 capsule (60 mg total) by mouth daily. 30  capsule 3 01/15/2017 at Unknown time  . HYDROmorphone (DILAUDID) 4 MG tablet Take 4 mg by mouth every 6 (six) hours as needed (breakthrough pain).   Past Week at Unknown time  . morphine (MSIR) 30 MG tablet Take 30 mg by mouth 2 (two) times daily. Take 1 tablet by mouth twice a day with 15mg  ER   01/15/2017 at Unknown time  . Morphine Sulfate ER 15 MG T12A Take 15 mg by mouth See admin instructions. Take 1 tablet (15 mg) by mouth twice daily - with a 30 mg tablet for a 45 mg dose   01/15/2017 at Unknown time  . OXYGEN Inhale 4 L into the lungs as needed (shortness of breath).   Past Week at Unknown time  . pregabalin (LYRICA) 150 MG capsule Take 150 mg by mouth 2 (two) times daily.   01/15/2017 at Unknown time  . temazepam (RESTORIL) 15 MG capsule Take 15 mg by mouth at bedtime.    01/14/2017 at Unknown time     Social History: Social History   Social History  . Marital status: Divorced    Spouse name: N/A  . Number of children: 2  . Years of education: N/A   Occupational History  . disabled    Social History Main Topics  . Smoking status: Current Every Day Smoker    Packs/day: 0.50    Years: 30.00    Types: Cigarettes  . Smokeless tobacco: Never Used  . Alcohol use 0.0 oz/week     Comment: Occ  . Drug use: No  . Sexual activity: Yes    Birth control/ protection: None   Other Topics Concern  . Not on file   Social History Narrative  . No narrative on file    Family History: Family History  Problem Relation Age of Onset  . Healthy Mother   . Lung cancer Maternal Grandfather   . Colon cancer Maternal Uncle        age 31  . Diabetes Other     Review of Systems: Positive: Night sweats. Negative: No fevers, chills, left flank pain.  A further 10 point review of systems was negative except what is listed in the HPI.  Physical Exam: @VITALS2 @ General: No acute distress.  Awake. Head:  Normocephalic.  Atraumatic. ENT:  EOMI.  Mucous membranes moist Neck:  Supple.  No  lymphadenopathy. CV:  S1 present. S2 present. Regular rate. Pulmonary: Equal effort bilaterally.  Clear to auscultation bilaterally. Abdomen: Soft.  Non- tender to palpation.  No CVA tenderness. Skin:  Normal turgor.  No visible rash. Extremity: No gross deformity of bilateral upper extremities.  No gross deformity of    bilateral lower extremities. Neurologic: Alert. Appropriate mood.    Studies:  Recent Labs     01/16/17  0633  01/17/17  0654  HGB  13.7  12.6*  WBC  14.7*  14.9*  PLT  367  315    Recent Labs     01/16/17  0633  01/17/17  0654  NA  137  133*  K  3.7  3.5  CL  103  100*  CO2  25  26  BUN  8  9  CREATININE  1.01  1.03  CALCIUM  8.3*  8.3*  GFRNONAA  >60  >60  GFRAA  >60  >60     No results for input(s): INR, APTT in the last 72 hours.  Invalid input(s): PT   Invalid input(s): ABG  I reviewed the patient's CT scan findings.  Assessment:  Left hydronephrosis, relatively new in onset.  This is no doubt caused by the patient's pelvic phlegmon/process.  He is relatively asymptomatic from this left hydronephrosis at the present time, and I'm not sure that he does have an infection in this kidney.  Plan: I spelled out.  Treatment options for this patient, who is in hospice, and I think nonaggressive therapy at this point would be worthwhile.  If the patient does get significantly sick/febrile/septic, consider percutaneous nephrostomy tube placement.  However, at the present time, this seems to be silent hydronephrosis, and aggressive management I don't think is warranted.  At this point, just observe, treat for possible infection, and consider no follow-up radiographic studies unless he becomes symptomatic.    Pager:[phone]

## 2017-01-18 ENCOUNTER — Encounter (HOSPITAL_COMMUNITY): Payer: Self-pay | Admitting: Primary Care

## 2017-01-18 DIAGNOSIS — C799 Secondary malignant neoplasm of unspecified site: Secondary | ICD-10-CM

## 2017-01-18 DIAGNOSIS — R1030 Lower abdominal pain, unspecified: Secondary | ICD-10-CM

## 2017-01-18 DIAGNOSIS — N1330 Unspecified hydronephrosis: Secondary | ICD-10-CM

## 2017-01-18 DIAGNOSIS — F418 Other specified anxiety disorders: Secondary | ICD-10-CM

## 2017-01-18 LAB — CBC
HCT: 36.8 % — ABNORMAL LOW (ref 39.0–52.0)
HEMOGLOBIN: 11.8 g/dL — AB (ref 13.0–17.0)
MCH: 27 pg (ref 26.0–34.0)
MCHC: 32.1 g/dL (ref 30.0–36.0)
MCV: 84.2 fL (ref 78.0–100.0)
Platelets: 274 10*3/uL (ref 150–400)
RBC: 4.37 MIL/uL (ref 4.22–5.81)
RDW: 18 % — ABNORMAL HIGH (ref 11.5–15.5)
WBC: 13.6 10*3/uL — AB (ref 4.0–10.5)

## 2017-01-18 LAB — BASIC METABOLIC PANEL
ANION GAP: 7 (ref 5–15)
BUN: 10 mg/dL (ref 6–20)
CALCIUM: 8.1 mg/dL — AB (ref 8.9–10.3)
CO2: 25 mmol/L (ref 22–32)
Chloride: 102 mmol/L (ref 101–111)
Creatinine, Ser: 0.95 mg/dL (ref 0.61–1.24)
Glucose, Bld: 183 mg/dL — ABNORMAL HIGH (ref 65–99)
Potassium: 3.2 mmol/L — ABNORMAL LOW (ref 3.5–5.1)
SODIUM: 134 mmol/L — AB (ref 135–145)

## 2017-01-18 LAB — TSH: TSH: 1.113 u[IU]/mL (ref 0.350–4.500)

## 2017-01-18 LAB — MAGNESIUM: Magnesium: 1.8 mg/dL (ref 1.7–2.4)

## 2017-01-18 LAB — T4, FREE: FREE T4: 0.89 ng/dL (ref 0.61–1.12)

## 2017-01-18 NOTE — Care Management Note (Signed)
Case Management Note  Patient Details  Name: Andrew Kline MRN: [id number] Date of Birth: 06/12/1965  Expected Discharge Date:  01/18/17               Expected Discharge Plan:  Home/Self Care  In-House Referral:  Clinical Social Work, Scientist, research (medical)  CM Consult  Post Acute Care Choice:  NA Choice offered to:  NA  Status of Service:  Completed, signed off  If discussed at [employer], dates discussed:    Additional Comments: Discharging home today with self care. Pt has follow up planned. MATCH voucher provided for potential DC. Pt has no prescriptions given at DC and will not need MATCH voucher.   Sherald Barge, RN 01/18/2017, 3:44 PM
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
## 2017-01-19 ENCOUNTER — Encounter (HOSPITAL_COMMUNITY): Payer: Self-pay | Admitting: *Deleted

## 2017-01-19 NOTE — Progress Notes (Signed)
Pt's spouse at bedside stating her "husband is not safe for discharge.." She continues to state that he is not safe staying at home alone and she must work and does not have anyone that can stay with him.  She also states that she refuses to leave until he is given prescriptions for Morphine.  He was not previously taking this medication prior to his hospitalization.   I explained to the spouse that the patient was cleared medically and is stable for discharge and the hospital is not responsible for a sitter at home while she works.  I also explained to the spouse, that the MD did not leave prescriptions for Morphine, however, she would need to follow up with his PCP to request chronic medications and whatever else she felt he "needed".  A voucher was given to the patient, which would help cover expenses for medications.  Spouse stated the patient did not have a PCP.  I offered information regarding the [facility] clinic in [place].  Explained to spouse that she would need to present her employment salary and patients disability income statements to the clinic in order for care to be established.  Again, information was given to them regarding the clinic.  Spouse then stated that she wanted me to take she and patient to the Cancer center on the fourth floor after I discharge him, in order to speak with them regarding re-establishing care.  It was explained to them both, that it would be their responsibility to connect with the [facility], after discharge,  that our responsibility was to safely discharge the patient from the unit.  She then demanded a W/C to take the patient to the [facility] herself.  All D/C paperwork had been completed 01/18/2017.  It was all reviewed again with the patient and spouse this morning, a w/c was provided to her, which we will retrieve in the front lobby on the first floor once patient and spouse have exited the hospital. Patient left the unit in stable condition  via w/c to private automobile, home.

## 2017-01-19 NOTE — Patient Instructions (Signed)
Patient came to clinic today.  Patient was recently discharged from [employer] after a year.  Patient was discharged without any medications and has concerns about his pain level.  Patient also had a recent admission to the hospital where he was given some pain medication but is now concerned about withdrawals.  Dr. Talbert Cage aware and referred patient to his primary care physician for further follow up and management of his pain.  Patient given list of primary care physicians in the area and advised to call and get an appointment with one of his choice.

## 2017-01-30 ENCOUNTER — Encounter (HOSPITAL_COMMUNITY): Payer: MEDICAID | Attending: Oncology | Admitting: Oncology

## 2017-01-30 ENCOUNTER — Encounter (HOSPITAL_COMMUNITY): Payer: Self-pay | Admitting: Lab

## 2017-01-30 ENCOUNTER — Encounter (HOSPITAL_COMMUNITY): Payer: Self-pay | Admitting: Oncology

## 2017-01-30 VITALS — BP 131/85 | HR 104 | Resp 20 | Ht 65.0 in | Wt 194.0 lb

## 2017-01-30 DIAGNOSIS — R1030 Lower abdominal pain, unspecified: Secondary | ICD-10-CM

## 2017-01-30 DIAGNOSIS — Z720 Tobacco use: Secondary | ICD-10-CM

## 2017-01-30 DIAGNOSIS — C20 Malignant neoplasm of rectum: Secondary | ICD-10-CM

## 2017-01-30 DIAGNOSIS — C7800 Secondary malignant neoplasm of unspecified lung: Secondary | ICD-10-CM

## 2017-01-30 NOTE — Progress Notes (Unsigned)
Referral sent to [facility].  Records fa

## 2017-01-30 NOTE — Progress Notes (Unsigned)
Referral sent to [facility]. Records faxed

## 2017-01-30 NOTE — Progress Notes (Signed)
 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       09/25/2014 Imaging    CT abd/pelvis- Indeterminate nodules within the bilateral lung bases with dominant approximately 1.7 cm centrally necrotic nodule within the right lower lobe...      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      10/03/2014 Tumor Marker    CEA NOT ELEVATED      10/09/2014 Imaging    CT Chest- Bilateral pulmonary nodules of varying size are most consistent with pulmonary metastasis. There are approximately 6 lesions in each lung.      10/17/2014 Pathology Results    Rectum, biopsy - INVASIVE ADENOCARCINOMA      10/29/2014 Pathology Results    Diagnosis Lung, needle/core biopsy(ies), right lower lobe - METASTATIC ADENOCARCINOMA      10/29/2014 Pathology Results    KRAS wildtype      11/04/2014 - 12/18/2014 Radiation Therapy    Dr. Moody      11/05/2014 - 05/26/2015 Chemotherapy    FOLFOX + Avastin      02/11/2015 Surgery    Colostomy formation by Dr. Jenkins.      02/26/2015 Imaging    CT CAP- Interval response to therapy with decrease in size of multiple bilateral pulmonary nodules. No new or progressive pulmonary metastasis.  No evidence for metastatic disease within the abdomen or pelvis.       03/22/2015 Imaging    CT RENAL STONE- There is an obstructing 4 mm stone within the distal left ureter resulting in moderate left hydroureteronephrosis.  Re- demonstrated pulmonary nodules within the right lower lobe  and lingula, compatible with known metastasis.      05/20/2015 Progression    CT Scans demonstrate pulmonary progression of disease.      05/20/2015 Imaging    CT CAP- Mild progression of bilateral pulmonary metastases, including a 1.9 cm cavitary nodule in the right lower lobe with increasing solid component.      06/23/2015 - 07/21/2015 Chemotherapy    FOLFIRI + Avastin      08/26/2015 Surgery    Abdominoperineal resection (palliative)      08/26/2015 Pathology Results    microscopic focus of adenocarcinoma of colon, tumor invades submucosa      09/23/2015 Procedure     Temporary Suprapubic catheter placement IR secondary to small knick in urethra during APR, urinary retention      11/04/2015 Imaging    CT CAP- Presacral collection of fluid and air is new from 05/20/2015 and worrisome for an abscess in this patient status post abdominal perineal resection on 08/26/2015.  Worsening pulmonary metastatic disease. Chronic calcific pancreatitis.      11/04/2015 Progression    CT imaging demonstrating progression of pulmonary nodules      11/04/2015 Imaging    CT C/A/P with presacral collection of fluid and air new worrisome for abscess, worsening pulmonary metastatic disease, chronic calcific pancreatitis      11/12/2015 Procedure    Successful CT-guided left trans   gluteal pelvic abscess drain insertion and successful CT-guided 12 French suprapubic catheter exchange by IR      01/13/2016 Imaging    CT CAP- 1. Enlarging bilateral pulmonary nodules compatible with progressive metastatic disease. Some of these nodules are cavitary. 2. Right hydronephrosis and hydroureter due to a distal stricture or regional wall thickening in the right distal ureter starting just past the iliac vessel cross over. This results in delayed excretion on the right, and considerable right hydronephrosis. In the area of involvement, the ureter does hurt the margin of the presacral soft tissue density. 3.  Increase in presacral soft tissue density noted. Reduced size of the internal fluid collection within this soft tissue density, currently 27 cc. I suspect that this collection may be extending anteriorly along its lower margin and possibly connecting to the prostatic urethra based on how for forwarded extends and based on the history of a brown discharge from the penis. The prior gas in this collection is no longer present but residual infection is not entirely excluded. The presacral soft tissue density does extend to the posterior bladder margin but I do not see any posterior leak of contrast from the urinary bladder or ureters on the delayed postvoid images. A suprapubic catheter is in place. 4. Left colostomy with peristomal hernia containing several loops of small bowel without complicating feature. Mild prominence of stool in the remaining colon.      01/13/2016 Progression    CT scans demonstrate progression of pulmonary nodules.      01/25/2016 - 01/31/2016 Hospital Admission    Admit date: 01/25/2016 Admission diagnosis: UTI Additional comments: B/L percutaneous nephrostomy tubes placed.      01/26/2016 Imaging    CT abd/pelvis WO contrast- 1. No clear explanation for RIGHT-sided pain. No significant change from CT 2 weeks prior. 2. Mild RIGHT hydroureter and hydronephrosis is similar to comparison exam. 3. Suprapubic catheter within a collapsed bladder. 4. Stable rounded fluid collection in the presacral space. 5. Long segment of small bowel enters a LEFT lower quadrant peristomal hernia. No evidence of bowel obstruction. 6. Pulmonary metastasis.      01/27/2016 Procedure    B/L percutaneous nephostomy tubes by IR      03/04/2016 -  Hospital Admission    Admit date: 03/04/2016 Admission diagnosis: Sepsis Additional comments: Transferred to [place].       HISTORY OF PRESENTING ILLNESS:  Andrew Kline 51 y.o. male is here because of stage IV rectal cancer.  He  has undergone a palliative APR, unfortunately had several complications but is now on the mend.  Tolerated first chemotherapy very well without any nausea vomiting diarrhea.  Patient had a nephrostomy tube placed and not draining any urine from suprapubic catheter. No significant difference in the pain level. Recent is also being seen by surgeon for incisional hernia January 30, 2017 Patient has significant abdominal pain.  Could not by any pain medication.  Patient has been recently [facility]  Severe uncontrolled pain in lower abdominal area. Had a recent CT scan during last hospitalization in September of 2018 patient is here to review wall the CT scan overall discuss his prognosis and trying to see if we can help with the pain medication.  MEDICAL HISTORY:  Past Medical History:  Diagnosis Date  . Anxiety   . Arthritis   . Depression   . Hospice care   . Kidney stone 03/22/15   left  . Rectal cancer (HCC)   .   Rectal pain   . Renal cell cancer ([place])    2012.   [name] Shortness of breath dyspnea   . Suprapubic catheter ([place])   . Ventral hernia    around ostomy site    SURGICAL HISTORY: Past Surgical History:  Procedure Laterality Date  . BIOPSY N/A 09/30/2014   Procedure: BIOPSY;  Surgeon: Danie Binder, MD;  Location: [facility];  Service: Endoscopy;  Laterality: N/A;  Anal Canal  . COLOSTOMY N/A 02/11/2015   Procedure: COLOSTOMY;  Surgeon: Aviva Signs, MD;  Location: [facility];  Service: General;  Laterality: N/A;  . FLEXIBLE SIGMOIDOSCOPY N/A 09/30/2014   Procedure: FLEXIBLE SIGMOIDOSCOPY;  Surgeon: Danie Binder, MD;  Location: [facility];  Service: Endoscopy;  Laterality: N/A;  . FLEXIBLE SIGMOIDOSCOPY N/A 10/17/2014   Procedure: FLEXIBLE SIGMOIDOSCOPY;  Surgeon: Danie Binder, MD;  Location: [facility] SU[facility];  Service: Endoscopy;  Laterality: N/A;  1325  . HYPOSPADIAS CORRECTION    . IR CATHETER TUBE CHANGE  11/06/2016  . IR GENERIC HISTORICAL   12/17/2015   IR CATHETER TUBE CHANGE 12/17/2015 [facility]  . IR GENERIC HISTORICAL  01/27/2016   IR NEPHROSTOMY PLACEMENT RIGHT 01/27/2016 [facility]  . IR GENERIC HISTORICAL  01/27/2016   IR NEPHROSTOMY PLACEMENT LEFT 01/27/2016 [facility]  . IR GENERIC HISTORICAL  03/08/2016   IR NEPHROSTOMY EXCHANGE LEFT 03/08/2016 Greggory Keen, MD [facility]  . IR GENERIC HISTORICAL  03/08/2016   IR NEPHROSTOMY EXCHANGE RIGHT 03/08/2016 Greggory Keen, MD [facility]  . IR GENERIC HISTORICAL  03/07/2016   IR CATHETER TUBE CHANGE 03/07/2016 [facility]  . IR GENERIC HISTORICAL  03/28/2016   IR CATHETER TUBE CHANGE 03/28/2016 Corrie Mckusick, DO [facility]  . IR GENERIC HISTORICAL  03/28/2016   IR NEPHROSTOMY TUBE CHANGE 03/28/2016 Corrie Mckusick, DO [facility]  . IR GENERIC HISTORICAL  06/16/2016   IR CATHETER TUBE CHANGE 06/16/2016 Aletta Edouard, MD [facility]  . IR GENERIC HISTORICAL  06/16/2016   IR NEPHROSTOGRAM LEFT THRU EXISTING ACCESS 06/16/2016 Aletta Edouard, MD [facility]  . KNEE SURGERY Left    arthroscopy  . LAPAROSCOPIC PARTIAL NEPHRECTOMY  2012   right side  . LUNG BIOPSY Right   . PERINEAL PROCTECTOMY N/A 08/26/2015   Procedure:  PROCTECTOMY;  Surgeon: Aviva Signs, MD;  Location: [facility];  Service: General;  Laterality: N/A;  . RECTAL SURGERY      SOCIAL HISTORY: Social History   Social History  . Marital status: Divorced    Spouse name: N/A  . Number of children: 2  . Years of education: N/A   Occupational History  . disabled    Social History Main Topics  . Smoking status: Current Every Day Smoker    Packs/day: 0.50    Years: 30.00    Types: Cigarettes  . Smokeless tobacco: Never Used  . Alcohol use 0.0 oz/week     Comment: Occ  . Drug use: No  . Sexual activity: Yes    Birth control/ protection: None   Other Topics Concern  . Not on file   Social History Narrative  . No narrative on file  He is from [place], [place]. Moved here in  March. Smoker, 1 ppd. Was 1.5 ppd. ETOH, none. 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 He is divorced. Lives with fiance. 2 children. 51 and 62 yo. No grandchildren.  FAMILY HISTORY: Family History  Problem Relation Age of Onset  . Healthy Mother   . Lung cancer Maternal Grandfather   . Colon cancer Maternal Uncle        age 45  . Diabetes Other    indicated that his mother is alive. He indicated that his father is alive. He indicated that the status of his maternal grandfather is unknown. He indicated that the status of his maternal uncle is unknown. He indicated that the status of his other is unknown.    Mother living, 64 yo. Still working as a Audiological scientist. Father living, 52-70 yo. 1 brother, 1 sister, 1 step sister. Maternal uncle has colon cancer. He is married to a GI nurse. So he has people to go to with questions and is informed on what is happening. History of diabetes in the family. No history of heart disease in the family.  ALLERGIES:  is allergic to oxaliplatin.  MEDICATIONS:  Current Outpatient Prescriptions  Medication Sig Dispense Refill  . amitriptyline (ELAVIL) 50 MG tablet TAKE ONE TABLET BY MOUTH AT BEDTIME. 15 tablet 0  . DULoxetine (CYMBALTA) 60 MG capsule Take 1 capsule (60 mg total) by mouth daily. 30 capsule 3  . HYDROmorphone (DILAUDID) 4 MG tablet Take 4 mg by mouth every 6 (six) hours as needed (breakthrough pain).    [name] morphine (MSIR) 30 MG tablet Take 30 mg by mouth 2 (two) times daily. Take 1 tablet by mouth twice a day with 21m ER    . Morphine Sulfate ER 15 MG T12A Take 15 mg by mouth See admin instructions. Take 1 tablet (15 mg) by mouth twice daily - with a 30 mg tablet for a 45 mg dose    . OXYGEN Inhale 4 L into the lungs as needed (shortness of breath).    . pregabalin (LYRICA) 150 MG capsule Take 150 mg by mouth 2 (two) times daily.    . temazepam  (RESTORIL) 15 MG capsule Take 15 mg by mouth at bedtime.      No current facility-administered medications for this visit.    Facility-Administered Medications Ordered in Other Visits  Medication Dose Route Frequency Provider Last Rate Last Dose  . 0.9 %  sodium chloride infusion   Intravenous Continuous Penland, SKelby Fam MD   Stopped at 08/26/15 1435    Review of Systems  Constitutional: Positive for malaise/fatigue HENT: Negative. Respiratory: Negative. Gastrointestinal:  Negative for blood in stool.  Genitourinary: Positive for groin pain  Psychiatric/Behavioral: Positive for ANXIETY. Patient's condition has been declining had multiple admissions in the hospital for pain control.  Patient has pain down discharged from the hospice care and now cannot by pain medications of pain is in the lower abdominal area gradually increasing. 14 point ROS was done and is otherwise as detailed above or in HPI   PHYSICAL EXAMINATION:  ECOG PERFORMANCE STATUS: 2    Physical Exam   HENT:  Head: Normocephalic and atraumatic.  Nose: Nose normal.  Mouth/Throat: Oropharynx is clear and moist. No oropharyngeal exudate.  Dentures on top.  Eyes: Conjunctivae and EOM are normal. Pupils are equal, round, and reactive to light. Right eye exhibits no discharge. Left eye exhibits no discharge. No scleral icterus.  Neck: Normal range of motion. Neck supple. No tracheal deviation present. No thyromegaly present.  Cardiovascular: Normal rate, regular rhythm and normal heart sounds.  Exam reveals no gallop and no friction rub.   No murmur heard. Pulmonary/Chest: Normal Abdominal: Soft. Bowel sounds are normal. He exhibits no distension  and no mass. There is no tenderness. There is no rebound and no guarding.  Patient has significant incisional hernia (at the site of ostomy spell and Ostomy is c/d/i  Suprapubic catheter Genitourinary:   Musculoskeletal: Normal range of motion. He exhibits no edema.    Lymphadenopathy:    He has no cervical adenopathy.  Neurological: He is alert and oriented to person, place, and time. He has normal reflexes. No cranial nerve deficit. Gait normal. Coordination normal.  Skin: Skin is warm and dry. No rash noted.  Psychiatric: Mood, memory, affect and judgment normal.  Nursing note and vitals reviewed.  Patient is in significant pain Cannot afford by pain medication has been rejected from the Medicaid LABORATORY DATA:  I have reviewed the data as listed Results for MACGUIRE, HOLSINGER (MRN [id number])   Patient will get his lab prior to chemotherapy tomorrow   RADIOLOGY: I have personally reviewed the radiological images as listed and agreed with the findings in the report.  Study Result     CLINICAL DATA: Restaging stage IV colorectal carcinoma. Ureter lacerated during surgery, status post repair.  EXAM: CT CHEST, ABDOMEN, AND PELVIS WITH CONTRAST  TECHNIQUE: Multidetector CT imaging of the chest, abdomen and pelvis was performed following the standard protocol during bolus administration of intravenous contrast.  CONTRAST: 14m ISOVUE-300 IOPAMIDOL (ISOVUE-300) INJECTION 61%  COMPARISON: CT chest abdomen pelvis 05/20/2015.  FINDINGS: CT CHEST FINDINGS  Mediastinum/Lymph Nodes: Right IJ Port-A-Cath terminates in the low SVC. Mediastinal lymph nodes are not enlarged by CT size criteria. No hilar or axillary adenopathy. Coronary artery calcification. Heart size normal. No pericardial effusion.  Lungs/Pleura: There are new and enlarging irregular nodules in the lungs. Index new nodule in the right lower lobe measures 1.5 cm (series 5, image 88). No pleural fluid. Airway is unremarkable.  Musculoskeletal: No worrisome lytic or sclerotic lesions.  CT ABDOMEN PELVIS FINDINGS  Hepatobiliary: Liver and gallbladder are unremarkable. No biliary ductal dilatation.  Pancreas: Calcifications are seen throughout  pancreas.  Spleen: Negative.  Adrenals/Urinary Tract: Adrenal glands are unremarkable. Question postoperative changes along the lower pole right kidney. Kidneys are otherwise unremarkable. Ureters are decompressed.  Stomach/Bowel: Stomach, small bowel and majority of the colon are unremarkable. Abdominal perineal resection with a left lower quadrant colostomy and a parastomal hernia containing unobstructed small bowel. A collection of fluid and air is seen in the presacral space, measuring 5.0 x 5.4 cm, new from 05/20/2015. On nephrographic phase imaging, there is no increase in attenuation within the fluid collection.  Vascular/Lymphatic: Atherosclerotic calcification of the arterial vasculature without abdominal aortic aneurysm. No pathologically enlarged lymph nodes.  Reproductive: Prostate is visualized.  Other: Presacral collection of fluid and air is discussed above. Mesenteries and peritoneum are otherwise unremarkable. Small left inguinal hernia contains fat.  Musculoskeletal: No worrisome lytic or sclerotic lesions.  IMPRESSION: 1. Presacral collection of fluid and air is new from 05/20/2015 and worrisome for an abscess in this patient status post abdominal perineal resection on 08/26/2015. Urinoma is not excluded. These results will be called to the ordering clinician or representative by the Radiologist Assistant, and communication documented in the PACS or zVision Dashboard. 2. Worsening pulmonary metastatic disease. 3. Coronary artery calcification. 4. Chronic calcific pancreatitis. 5. Aortic atherosclerosis.   Electronically Signed  By: MLorin PicketM.D.  On: 11/04/2015 14:35        ASSESSMENT & PLAN:  STAGE IV Rectal vs. Anal carcinoma, adenocarcinoma Pulmonary metastases Good PS [facility] [place] FOLFOX/AVASTIN XRT for palliation Colostomy for palliation of poor bowel  control Lyrica for pain, with marked improvement in QOL and pain scores Contact Dermatitis from Fentanyl patch Weight loss Bronchitis -- resolved FOLFIRI/AVASTIN S/P palliative APR TEMP suprapubic catheter  Pre-sacral fluid collection vs. abscess  January 30, 2017  CT scan of September of 2018 has been reviewed shows progressive disease in the lung.  I had detailed discussion with the patient that progression is slow and overall life expectancy can be 6-9 months  Patient has significant lower abdominal pain on morphine and Dilaudid but could not by pain medication Recent the patient has been discharged from for [facility] hosp[facility] organization  Patient needs help 2 by pain medication as he had been rejected from hospice care as well as rejected from [facility]  Patient does not want to consider any further chemotherapy had number of lines of chemotherapy in the past Return appointment has needed Total discussion time was 45 minutes arranging care.  Discussing with social services . referring patient back to nonprofit hospice organization.

## 2017-02-09 ENCOUNTER — Ambulatory Visit (HOSPITAL_COMMUNITY): Payer: Self-pay

## 2017-03-27 ENCOUNTER — Telehealth (HOSPITAL_COMMUNITY): Payer: Self-pay | Admitting: *Deleted

## 2017-03-28 ENCOUNTER — Telehealth (HOSPITAL_COMMUNITY): Payer: Self-pay

## 2017-03-28 NOTE — Telephone Encounter (Signed)
Discharged from [facility] hospice due to the following reasons: Drug paraphanelia lying around, dog almost attacked the nurse, patient being rude/cussing @ nurses.

## 2017-04-05 ENCOUNTER — Telehealth (HOSPITAL_COMMUNITY): Payer: Self-pay

## 2017-04-05 NOTE — Telephone Encounter (Signed)
Patient called stating he wanted to "make an appointment so he could get his medicine". Reviewed with MD. Patient has been discharged from 2 hospice providers. The last one being [facility] because of drug paraphernalia lying around, aggressive dogs, and the patient being rude and cussing. Because of these reasons, Dr. Talbert Cage said we will not see the patient again but are willing to transfer him to another cancer center. Explained this to the patient. All he said was "okay" and that he did not want to be referred anywhere.  About 10-15 minutes later the patients wife called back wanting to know why we wouldn't see the patient. She states hospice discharged him because of medicaid not paying. Explained to her the reasons Hospice stated to [place] (see above in this note and note dated 11/27). Wife states her "dogs are not aggressive" and she was "there when the hospice nurse came" and the hospice nurse "did not ask for the dogs to be put up." The wife also stated that the drug paraphernalia is from her daughter. She is trying to find out what she is doing. The wife states she cannot help that Hospice is not getting paid. I again explained to wife that we can only go by what hospice reports to [place].The reason hospice discharged him is not because of money and payment or lack thereof. He was discharged because of drug paraphernalia lying around, aggressive dogs, and the patient being rude and cussing. I reminded wife that he has now been discharged from 2 hospice providers. She exclaimed "for bull shit reasons."  Wife then requested [facility] phone number, then said "never mind I have it" and hung up.

## 2017-04-07 ENCOUNTER — Encounter (HOSPITAL_COMMUNITY): Payer: Self-pay | Admitting: Emergency Medicine

## 2017-04-07 ENCOUNTER — Emergency Department (HOSPITAL_COMMUNITY): Payer: Medicare Other

## 2017-04-07 ENCOUNTER — Inpatient Hospital Stay (HOSPITAL_COMMUNITY)
Admission: EM | Admit: 2017-04-07 | Discharge: 2017-04-13 | DRG: 854 | Disposition: A | Discharge status: Home Health | Payer: Medicare Other | Attending: Internal Medicine | Admitting: Internal Medicine

## 2017-04-07 ENCOUNTER — Other Ambulatory Visit: Payer: Self-pay

## 2017-04-07 DIAGNOSIS — C7801 Secondary malignant neoplasm of right lung: Secondary | ICD-10-CM | POA: Diagnosis present

## 2017-04-07 DIAGNOSIS — Z85048 Personal history of other malignant neoplasm of rectum, rectosigmoid junction, and anus: Secondary | ICD-10-CM

## 2017-04-07 DIAGNOSIS — A4153 Sepsis due to Serratia: Secondary | ICD-10-CM | POA: Diagnosis present

## 2017-04-07 DIAGNOSIS — G893 Neoplasm related pain (acute) (chronic): Secondary | ICD-10-CM | POA: Diagnosis present

## 2017-04-07 DIAGNOSIS — K611 Rectal abscess: Secondary | ICD-10-CM | POA: Diagnosis present

## 2017-04-07 DIAGNOSIS — N136 Pyonephrosis: Secondary | ICD-10-CM | POA: Diagnosis present

## 2017-04-07 DIAGNOSIS — A4159 Other Gram-negative sepsis: Secondary | ICD-10-CM | POA: Diagnosis present

## 2017-04-07 DIAGNOSIS — N1330 Unspecified hydronephrosis: Secondary | ICD-10-CM | POA: Diagnosis present

## 2017-04-07 DIAGNOSIS — F17210 Nicotine dependence, cigarettes, uncomplicated: Secondary | ICD-10-CM | POA: Diagnosis present

## 2017-04-07 DIAGNOSIS — L0231 Cutaneous abscess of buttock: Secondary | ICD-10-CM | POA: Diagnosis present

## 2017-04-07 DIAGNOSIS — Z933 Colostomy status: Secondary | ICD-10-CM | POA: Diagnosis not present

## 2017-04-07 DIAGNOSIS — Z79899 Other long term (current) drug therapy: Secondary | ICD-10-CM | POA: Diagnosis not present

## 2017-04-07 DIAGNOSIS — Z79891 Long term (current) use of opiate analgesic: Secondary | ICD-10-CM | POA: Diagnosis not present

## 2017-04-07 DIAGNOSIS — F418 Other specified anxiety disorders: Secondary | ICD-10-CM | POA: Diagnosis present

## 2017-04-07 DIAGNOSIS — Z9981 Dependence on supplemental oxygen: Secondary | ICD-10-CM | POA: Diagnosis not present

## 2017-04-07 DIAGNOSIS — E871 Hypo-osmolality and hyponatremia: Secondary | ICD-10-CM | POA: Diagnosis present

## 2017-04-07 DIAGNOSIS — D72829 Elevated white blood cell count, unspecified: Secondary | ICD-10-CM

## 2017-04-07 DIAGNOSIS — Z7952 Long term (current) use of systemic steroids: Secondary | ICD-10-CM | POA: Diagnosis not present

## 2017-04-07 DIAGNOSIS — F329 Major depressive disorder, single episode, unspecified: Secondary | ICD-10-CM | POA: Diagnosis present

## 2017-04-07 DIAGNOSIS — F419 Anxiety disorder, unspecified: Secondary | ICD-10-CM | POA: Diagnosis present

## 2017-04-07 DIAGNOSIS — R000 Tachycardia, unspecified: Secondary | ICD-10-CM | POA: Diagnosis present

## 2017-04-07 DIAGNOSIS — Z905 Acquired absence of kidney: Secondary | ICD-10-CM

## 2017-04-07 LAB — CBC WITH DIFFERENTIAL/PLATELET
Basophils Absolute: 0 10*3/uL (ref 0.0–0.1)
Basophils Relative: 0 %
Eosinophils Absolute: 0.3 10*3/uL (ref 0.0–0.7)
Eosinophils Relative: 2 %
HCT: 41.8 % (ref 39.0–52.0)
Hemoglobin: 13.1 g/dL (ref 13.0–17.0)
Lymphocytes Relative: 8 %
Lymphs Abs: 1.4 10*3/uL (ref 0.7–4.0)
MCH: 27 pg (ref 26.0–34.0)
MCHC: 31.3 g/dL (ref 30.0–36.0)
MCV: 86 fL (ref 78.0–100.0)
Monocytes Absolute: 1.5 10*3/uL — ABNORMAL HIGH (ref 0.1–1.0)
Monocytes Relative: 9 %
Neutro Abs: 14 10*3/uL — ABNORMAL HIGH (ref 1.7–7.7)
Neutrophils Relative %: 81 %
Platelets: 593 10*3/uL — ABNORMAL HIGH (ref 150–400)
RBC: 4.86 MIL/uL (ref 4.22–5.81)
RDW: 17.2 % — ABNORMAL HIGH (ref 11.5–15.5)
WBC: 17.2 10*3/uL — ABNORMAL HIGH (ref 4.0–10.5)

## 2017-04-07 LAB — COMPREHENSIVE METABOLIC PANEL
ALT: 47 U/L (ref 17–63)
AST: 23 U/L (ref 15–41)
Albumin: 3 g/dL — ABNORMAL LOW (ref 3.5–5.0)
Alkaline Phosphatase: 308 U/L — ABNORMAL HIGH (ref 38–126)
Anion gap: 11 (ref 5–15)
BUN: 7 mg/dL (ref 6–20)
CO2: 26 mmol/L (ref 22–32)
Calcium: 9.2 mg/dL (ref 8.9–10.3)
Chloride: 95 mmol/L — ABNORMAL LOW (ref 101–111)
Creatinine, Ser: 0.92 mg/dL (ref 0.61–1.24)
GFR calc Af Amer: >60 mL/min (ref >60)
GFR calc non Af Amer: >60 mL/min (ref >60)
Glucose, Bld: 180 mg/dL — ABNORMAL HIGH (ref 65–99)
Potassium: 4.2 mmol/L (ref 3.5–5.1)
Sodium: 132 mmol/L — ABNORMAL LOW (ref 135–145)
Total Bilirubin: 0.4 mg/dL (ref 0.3–1.2)
Total Protein: 8.4 g/dL — ABNORMAL HIGH (ref 6.5–8.1)

## 2017-04-07 LAB — URINALYSIS, ROUTINE W REFLEX MICROSCOPIC
Bilirubin Urine: NEGATIVE
Glucose, UA: NEGATIVE mg/dL
Ketones, ur: NEGATIVE mg/dL
Nitrite: NEGATIVE
Protein, ur: 100 mg/dL — AB
Specific Gravity, Urine: 1.008 (ref 1.005–1.030)
Squamous Epithelial / LPF: NONE SEEN
pH: 8 (ref 5.0–8.0)

## 2017-04-07 LAB — PROTIME-INR
INR: 1.15
Prothrombin Time: 14.6 seconds (ref 11.4–15.2)

## 2017-04-07 LAB — I-STAT CG4 LACTIC ACID, ED
Lactic Acid, Venous: 2.11 mmol/L (ref 0.5–1.9)
Lactic Acid, Venous: 1.02 mmol/L (ref 0.5–1.9)

## 2017-04-07 MED ORDER — DEXTROSE 5 % IV SOLN
1.0000 g | INTRAVENOUS | Status: DC
  Administered 2017-04-07 – 2017-04-08 (×2): 1 g via INTRAVENOUS
  Filled 2017-04-07 (×2): qty 10

## 2017-04-07 MED ORDER — HYDROMORPHONE HCL 1 MG/ML IJ SOLN
1.0000 mg | Freq: Once | INTRAMUSCULAR | Status: AC
  Administered 2017-04-07: 1 mg via INTRAVENOUS
  Filled 2017-04-07: qty 1

## 2017-04-07 MED ORDER — AMITRIPTYLINE HCL 50 MG PO TABS
50.0000 mg | ORAL_TABLET | Freq: Every day | ORAL | Status: DC
  Administered 2017-04-08 – 2017-04-12 (×6): 50 mg via ORAL
  Filled 2017-04-07 (×8): qty 1

## 2017-04-07 MED ORDER — DULOXETINE HCL 60 MG PO CPEP
60.0000 mg | ORAL_CAPSULE | Freq: Every day | ORAL | Status: DC
  Administered 2017-04-08 – 2017-04-13 (×6): 60 mg via ORAL
  Filled 2017-04-07 (×6): qty 1

## 2017-04-07 MED ORDER — PREGABALIN 75 MG PO CAPS
150.0000 mg | ORAL_CAPSULE | Freq: Two times a day (BID) | ORAL | Status: DC
  Administered 2017-04-08 – 2017-04-13 (×12): 150 mg via ORAL
  Filled 2017-04-07 (×12): qty 2

## 2017-04-07 MED ORDER — TEMAZEPAM 15 MG PO CAPS
15.0000 mg | ORAL_CAPSULE | Freq: Every day | ORAL | Status: DC
  Administered 2017-04-08 – 2017-04-12 (×6): 15 mg via ORAL
  Filled 2017-04-07 (×6): qty 1

## 2017-04-07 MED ORDER — ACETAMINOPHEN 325 MG PO TABS: 650.0000 mg | ORAL_TABLET | Freq: Four times a day (QID) | ORAL | Status: DC | PRN

## 2017-04-07 MED ORDER — METRONIDAZOLE IN NACL 5-0.79 MG/ML-% IV SOLN
500.0000 mg | Freq: Once | INTRAVENOUS | Status: AC
  Administered 2017-04-07: 500 mg via INTRAVENOUS
  Filled 2017-04-07: qty 100

## 2017-04-07 MED ORDER — HYDROMORPHONE HCL 1 MG/ML IJ SOLN
1.0000 mg | INTRAMUSCULAR | Status: DC | PRN
  Administered 2017-04-08 – 2017-04-10 (×13): 1 mg via INTRAVENOUS
  Filled 2017-04-07 (×13): qty 1

## 2017-04-07 MED ORDER — SODIUM CHLORIDE 0.9 % IV BOLUS (SEPSIS)
1000.0000 mL | Freq: Once | INTRAVENOUS | Status: AC
  Administered 2017-04-07: 1000 mL via INTRAVENOUS

## 2017-04-07 MED ORDER — SODIUM CHLORIDE 0.9 % IV SOLN
INTRAVENOUS | Status: AC
  Administered 2017-04-07 – 2017-04-08 (×2): via INTRAVENOUS

## 2017-04-07 MED ORDER — ONDANSETRON HCL 4 MG/2ML IJ SOLN: 4.0000 mg | Freq: Four times a day (QID) | INTRAMUSCULAR | Status: DC | PRN

## 2017-04-07 MED ORDER — DEXTROSE 5 % IV SOLN
1.0000 g | Freq: Once | INTRAVENOUS | Status: AC
  Administered 2017-04-07: 1 g via INTRAVENOUS
  Filled 2017-04-07: qty 10

## 2017-04-07 MED ORDER — ENOXAPARIN SODIUM 40 MG/0.4ML ~~LOC~~ SOLN
40.0000 mg | SUBCUTANEOUS | Status: DC
  Administered 2017-04-07: 40 mg via SUBCUTANEOUS
  Filled 2017-04-07: qty 0.4

## 2017-04-07 MED ORDER — ACETAMINOPHEN 650 MG RE SUPP: 650.0000 mg | Freq: Four times a day (QID) | RECTAL | Status: DC | PRN

## 2017-04-07 MED ORDER — IOPAMIDOL (ISOVUE-300) INJECTION 61%
100.0000 mL | Freq: Once | INTRAVENOUS | Status: AC | PRN
  Administered 2017-04-07: 100 mL via INTRAVENOUS

## 2017-04-07 NOTE — ED Triage Notes (Addendum)
Pt reports back pain, fever x2 weeks. Pt reports had colostomy and suprapubic catheter that pt maintains through self care. Pt denies any tylenol or ibuprofen today.

## 2017-04-07 NOTE — Consult Note (Signed)
I know this pt well since he had proctocolectomy by [facility] and had an unintended prostatic urethral injury. He had a resultant pelvic abscess that was treated with an sp tube and bilateral perc tube placements, all by interventional radiology. I have helped w/ mgmt of this man, but IR has provided drainage.  From what I understand, he is now in hospice/palliative care. With his current process/condition, the decision must be made whether to proceed with drainage of kidneys (bilateral perc tubes) which will prolong his life or to provide comfort care. If the pt decides on treatment, I would suggest that IR in [place] be called to place drains. I will be happy to answer any questions if needed.

## 2017-04-07 NOTE — ED Notes (Signed)
Pt is requesting food and drink at the moment, pt stated he hasnt ate all day.

## 2017-04-07 NOTE — H&P (Signed)
TRH H&P   Patient Demographics:    Andrew Kline, is a 51 y.o. male  MRN: [id number]   DOB - 1965-07-17  Admit Date - 04/07/2017  Outpatient Primary MD for the patient is Andrew Collie, MD  Referring MD/NP/PA:  Virgel Kline  Outpatient Specialists:  Andrew Kline Dr. Arnoldo Kline   Patient coming from: home  Chief Complaint  Patient presents with  . Back Pain      HPI:    Andrew Kline  is a 51 y.o. 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 The back pain has been going on for about 1.5 weeks. Pt states fever and sweats for the past 2 days.    In ED,  CT scan abd/ pelvis IMPRESSION: 1. There is now moderate to severe bilateral hydronephrosis, stable on the left and new on the right. There is an abrupt transition in both dilated ureters in the pelvis. No definitive underlying cause identified. No stone. The findings are consistent bilateral obstruction. The perinephric stranding on the left has almost completely resolved. 2. The chronic irregular thick walled fluid collection in the proctectomy bad has significantly enlarged and worsened since the comparison CT scan. The overall size has increased as described above. Extension into the adjacent pelvic musculature has also worsened. The periosteal reaction along the anterior margin of the adjacent sacrum is mildly worsened suggesting osteomyelitis. This could represent an abscess and/or recurrent tumor. 3. Interval progression of pulmonary metastases. 4. 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 Loops of small bowel within the peristomal hernia are mildly prominent measuring up to 3 cm on coronal imaging. Recommend clinical correlation. 5. Atherosclerotic change in the aorta.   Ed consulted urology  regarding bilateral hydronephrosis and they recommended [facility] , percutaneous nephrostomy.     Review of systems:    In addition to the HPI above,  No Headache, No changes with Vision or hearing, No problems swallowing food or Liquids, No Chest pain, Cough or Shortness of Breath, No Abdominal pain, No Nausea or Vommitting, Bowel movements are regular, No Blood in stool or Urine, No dysuria, No new skin rashes or bruises, No new joints pains-aches,  No new weakness, tingling, numbness in any extremity, No recent weight gain or loss, No polyuria, polydypsia or polyphagia, No significant Mental Stressors.  A full 10 point Review of Systems was done, except as stated above, all other Review of Systems were negative.   With Past History of the following :    Past Medical History:  Diagnosis Date  . Anxiety   . Arthritis   . Depression   . Hospice care   . Kidney stone 03/22/15   left  . 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  Rectal pain   . Renal cell cancer ([place])    2012.   [name] Shortness of breath dyspnea   . Suprapubic catheter ([place])   . Ventral hernia    around ostomy site      Past Surgical History:  Procedure Laterality Date  . BIOPSY N/A 09/30/2014   Procedure: BIOPSY;  Surgeon: Danie Binder, MD;  Location: [facility];  Service: Endoscopy;  Laterality: N/A;  Anal Canal  . COLOSTOMY N/A 02/11/2015   Procedure: COLOSTOMY;  Surgeon: Aviva Signs, MD;  Location: [facility];  Service: General;  Laterality: N/A;  . FLEXIBLE SIGMOIDOSCOPY N/A 09/30/2014   Procedure: FLEXIBLE SIGMOIDOSCOPY;  Surgeon: Danie Binder, MD;  Location: [facility];  Service: Endoscopy;  Laterality: N/A;  . FLEXIBLE SIGMOIDOSCOPY N/A 10/17/2014   Procedure: FLEXIBLE SIGMOIDOSCOPY;  Surgeon: Danie Binder, MD;  Location: [facility];  Service: Endoscopy;  Laterality: N/A;  1325  . HYPOSPADIAS CORRECTION    . IR CATHETER TUBE CHANGE  11/06/2016  . IR GENERIC  HISTORICAL  12/17/2015   IR CATHETER TUBE CHANGE 12/17/2015 [facility]  . IR GENERIC HISTORICAL  01/27/2016   IR NEPHROSTOMY PLACEMENT RIGHT 01/27/2016 [facility]  . IR GENERIC HISTORICAL  01/27/2016   IR NEPHROSTOMY PLACEMENT LEFT 01/27/2016 [facility]  . IR GENERIC HISTORICAL  03/08/2016   IR NEPHROSTOMY EXCHANGE LEFT 03/08/2016 Greggory Keen, MD [facility]  . IR GENERIC HISTORICAL  03/08/2016   IR NEPHROSTOMY EXCHANGE RIGHT 03/08/2016 Greggory Keen, MD [facility]  . IR GENERIC HISTORICAL  03/07/2016   IR CATHETER TUBE CHANGE 03/07/2016 [facility]  . IR GENERIC HISTORICAL  03/28/2016   IR CATHETER TUBE CHANGE 03/28/2016 Corrie Mckusick, DO [facility]  . IR GENERIC HISTORICAL  03/28/2016   IR NEPHROSTOMY TUBE CHANGE 03/28/2016 Corrie Mckusick, DO [facility]  . IR GENERIC HISTORICAL  06/16/2016   IR CATHETER TUBE CHANGE 06/16/2016 Aletta Edouard, MD [facility]  . IR GENERIC HISTORICAL  06/16/2016   IR NEPHROSTOGRAM LEFT THRU EXISTING ACCESS 06/16/2016 Aletta Edouard, MD [facility]  . KNEE SURGERY Left    arthroscopy  . LAPAROSCOPIC PARTIAL NEPHRECTOMY  2012   right side  . LUNG BIOPSY Right   . PERINEAL PROCTECTOMY N/A 08/26/2015   Procedure:  PROCTECTOMY;  Surgeon: Aviva Signs, MD;  Location: [facility];  Service: General;  Laterality: N/A;  . RECTAL SURGERY        Social History:     Social History   Tobacco Use  . Smoking status: Current Every Day Smoker    Packs/day: 0.50    Years: 30.00    Pack years: 15.00    Types: Cigarettes  . Smokeless tobacco: Never Used  Substance Use Topics  . Alcohol use: Yes    Alcohol/week: 0.0 oz    Comment: Occ     Lives - at home  Mobility - walks by self   Family History :     Family History  Problem Relation Age of Onset  . Healthy Mother   . Lung cancer Maternal Grandfather   . Colon cancer Maternal Uncle        age 42  . Diabetes Other       Home Medications:   Prior to Admission medications   Medication  Sig Start Date End Date Taking? Authorizing Provider  amitriptyline (ELAVIL) 50 MG tablet TAKE ONE TABLET BY MOUTH AT BEDTIME. 05/10/16  Yes Penland, Kelby Fam, MD  DULoxetine (CYMBALTA) 60 MG capsule Take 1 capsule (  60 mg total) by mouth daily. 12/31/15  Yes Penland, Kelby Fam, MD  HYDROmorphone (DILAUDID) 4 MG tablet Take 4 mg by mouth every 6 (six) hours as needed (breakthrough pain).    Yes [provider]  morphine (MSIR) 30 MG tablet Take 30 mg by mouth 2 (two) times daily. Take 2 tablets by mouth twice a day   Yes [provider]  OXYGEN Inhale 4 L into the lungs as needed (shortness of breath).   Yes [provider]  predniSONE (DELTASONE) 20 MG tablet Take 20 mg by mouth daily with breakfast.   Yes [provider]  pregabalin (LYRICA) 150 MG capsule Take 150 mg by mouth 2 (two) times daily.   Yes [provider]  promethazine (PHENERGAN) 25 MG tablet Take 25 mg by mouth every 4 (four) hours as needed for nausea or vomiting.   Yes [provider]  temazepam (RESTORIL) 15 MG capsule Take 15 mg by mouth at bedtime.    Yes [provider]  Morphine Sulfate ER 15 MG T12A Take 15 mg by mouth See admin instructions. Take 1 tablet (15 mg) by mouth twice daily - with a 30 mg tablet for a 45 mg dose    [provider]     Allergies:     Allergies  Allergen Reactions  . Oxaliplatin Itching  . Fentanyl     itching     Physical Exam:   Vitals  Blood pressure 105/71, pulse 92, temperature 100 F (37.8 C), resp. rate 18, height 5\' 5"  (1.651 m), weight 90.7 kg (200 lb), SpO2 100 %.   1. General  lying in bed in NAD,   2. Normal affect and insight, Not Suicidal or Homicidal, Awake Alert, Oriented X 3.  3. No F.N deficits, ALL C.Nerves Intact, Strength 5/5 all 4 extremities, Sensation intact all 4 extremities, Plantars down going.  4. Ears and Eyes appear Normal, Conjunctivae clear, PERRLA. Moist Oral Mucosa.  5.  Supple Neck, No JVD, No cervical lymphadenopathy appriciated, No Carotid Bruits.  6. Symmetrical Chest wall movement, Good air movement bilaterally, CTAB.  7. RRR, No Gallops, Rubs or Murmurs, No Parasternal Heave.  8. Positive Bowel Sounds, Abdomen Soft, No tenderness, No organomegaly appriciated,No rebound -guarding or rigidity.  9.  No Cyanosis, Normal Skin Turgor, No Skin Rash or Bruise.  10. Good muscle tone,  joints appear normal , no effusions, Normal ROM.  11. No Palpable Lymph Nodes in Neck or Axillae  +suprapubic catheter, yellow urine + colostomy    Data Review:    CBC Recent Labs  Lab 04/07/17 1520  WBC 17.2*  HGB 13.1  HCT 41.8  PLT 593*  MCV 86.0  MCH 27.0  MCHC 31.3  RDW 17.2*  LYMPHSABS 1.4  MONOABS 1.5*  EOSABS 0.3  BASOSABS 0.0   ------------------------------------------------------------------------------------------------------------------  Chemistries  Recent Labs  Lab 04/07/17 1520  NA 132*  K 4.2  CL 95*  CO2 26  GLUCOSE 180*  BUN 7  CREATININE 0.92  CALCIUM 9.2  AST 23  ALT 47  ALKPHOS 308*  BILITOT 0.4   ------------------------------------------------------------------------------------------------------------------ estimated creatinine clearance is 96.1 mL/min (by C-G formula based on SCr of 0.92 mg/dL). ------------------------------------------------------------------------------------------------------------------ No results for input(s): TSH, T4TOTAL, T3FREE, THYROIDAB in the last 72 hours.  Invalid input(s): FREET3  Coagulation profile Recent Labs  Lab 04/07/17 1520  INR 1.15   ------------------------------------------------------------------------------------------------------------------- No results for input(s): DDIMER in the last 72 hours. -------------------------------------------------------------------------------------------------------------------  Cardiac Enzymes No results for  input(s): CKMB,  TROPONINI, MYOGLOBIN in the last 168 hours.  Invalid input(s): CK ------------------------------------------------------------------------------------------------------------------ No results found for: BNP   ---------------------------------------------------------------------------------------------------------------  Urinalysis    Component Value Date/Time   COLORURINE YELLOW 04/07/2017 1542   APPEARANCEUR HAZY (A) 04/07/2017 1542   LABSPEC 1.008 04/07/2017 1542   PHURINE 8.0 04/07/2017 1542   GLUCOSEU NEGATIVE 04/07/2017 1542   HGBUR SMALL (A) 04/07/2017 1542   BILIRUBINUR NEGATIVE 04/07/2017 1542   KETONESUR NEGATIVE 04/07/2017 1542   PROTEINUR 100 (A) 04/07/2017 1542   UROBILINOGEN 0.2 03/17/2015 0910   NITRITE NEGATIVE 04/07/2017 1542   LEUKOCYTESUR LARGE (A) 04/07/2017 1542    ----------------------------------------------------------------------------------------------------------------   Imaging Results:    Dg Chest 2 View  Result Date: 04/07/2017 CLINICAL DATA:  Back pain.  Fever, cough, and chills. EXAM: CHEST  2 VIEW COMPARISON:  03/04/2016. FINDINGS: PowerPort catheter noted looped in the right IJ with its tip tip over the upper superior vena cava. This is in a more superior location than on prior study of 03/04/2016. Heart size normal. Multifocal bilateral pulmonary infiltrates versus mass lesions. Bilateral pneumonia and/or metastatic disease could present in this fashion. These findings are new from prior exam. No pleural effusion or pneumothorax. No acute bony abnormality . IMPRESSION: 1. PowerPort catheter noted looped in the right IJ with its tip over the upper superior vena cava. 2. Multifocal bilateral pulmonary infiltrates and/or mass lesions. Findings consistent with multifocal bilateral pneumonia and/or metastatic disease. These findings are new from prior exam . Electronically Signed   By: [place]   On: 04/07/2017 17:11   Ct Abdomen Pelvis W  Contrast  Result Date: 04/07/2017 CLINICAL DATA:  History of renal carcinoma in 2012. 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 History of right partial nephrectomy. Suprapubic catheter. EXAM: CT ABDOMEN AND PELVIS WITH CONTRAST TECHNIQUE: Multidetector CT imaging of the abdomen and pelvis was performed using the standard protocol following bolus administration of intravenous contrast. CONTRAST:  123mL ISOVUE-300 IOPAMIDOL (ISOVUE-300) INJECTION 61% COMPARISON:  January 15, 2017 FINDINGS: Lower chest: Interval progression of pulmonary metastases. A representative metastasis in the right lower lobe on series 5, image 2 measures 3.1 cm today versus 2.9 cm previously. The previously measured metastasis in the medial right lower lobe on series 5, image 6 is now confluent with adjacent nodules making measurement difficult. However, it measures 4.2 today versus 3.6 cm previously. A lingular metastasis on image 7 measures 2.6 cm today versus 2.7 cm previously. There also appear to be a few new small metastases. No other changes in the soft tissues of the chest. Hepatobiliary: No focal liver abnormality is seen. No gallstones, gallbladder wall thickening, or biliary dilatation. Pancreas: Calcifications in the pancreas are consistent with previous pancreatitis. No peripancreatic stranding today to suggest acute pancreatitis. No pancreatic mass noted. Spleen: Normal in size without focal abnormality. Adrenals/Urinary Tract: The adrenal glands are normal. The patient is status post partial nephrectomy on the right at the lower pole. This site is unchanged with no evidence recurrence. No renal masses are seen. There is right-sided hydronephrosis which is moderate to severe. The right ureter is dilated over its proximal half. There is a discrete change in caliber in the pelvis as seen on series 2, image 65 and is not dilated beyond this point. No underlying cause identified. There is persistent  moderate to severe hydronephrosis on the left and the left ureter is also dilated along the proximal 2/3 of its length. There is also an abrupt caliber  change in the pelvis. The distal most aspect of the left ureter is not dilated. No ureteral stones identified. The bladder is decompressed with a suprapubic catheter which is stable. The perinephric stranding on the left has largely resolved. Delayed images demonstrate contrast pooling in the dilated renal calices. Stomach/Bowel: The stomach is normal in appearance. The small bowel is normal. The patient has a left lower quadrant ostomy with a peristomal hernia containing small bowel loops. These loops of small bowel within the peristomal hernia are borderline measuring up to 3 cm but there is no associated wall thickening. The portion of colon within the ostomy remains compressed into the right side of the ostomy sac by adjacent small bowel loops but this is stable. The remaining colon is normal. There is no convincing evidence of appendicitis. There is a collection with adjacent stranding in the pelvis which will be described below. The stranding abuts the distal appendix. However, the adjacent appendix is nondilated. Vascular/Lymphatic: Mild atherosclerotic changes in the nonaneurysmal aorta. The iliac vessels are also involved. The lymph nodes are normal. Reproductive: The patient is status post prostatectomy. The irregular fluid collection in the prostatectomy bed remains, demonstrating a thick enhancing wall with prominent surrounding fat stranding, again intimately involved with the pelvic floor musculature and posterior bladder wall. This collection is larger in the interval and appears multiloculated. The collection measures 8.6 by 6.8 cm on series 2, image 79. At this same level of the previous study, the collection measured 5.6 x 4.0 cm. There is also increased inferior extension with loculated components of fluid to both sides of the anal canal on series  2, image 84 which are much larger. The superior extension may be slightly increased in the interval. There appears to be involvement of the adjacent pelvic musculature which is worsened. The periosteal reaction along the anterior margin of the sacrum is probably mildly worsened in the interval. Other: No free air.  No peritoneal or omental disease identified. Musculoskeletal: Periosteal reaction along the anterior sacral margin as described above. No other bony changes. IMPRESSION: 1. There is now moderate to severe bilateral hydronephrosis, stable on the left and new on the right. There is an abrupt transition in both dilated ureters in the pelvis. No definitive underlying cause identified. No stone. The findings are consistent bilateral obstruction. The perinephric stranding on the left has almost completely resolved. 2. The chronic irregular thick walled fluid collection in the proctectomy bad has significantly enlarged and worsened since the comparison CT scan. The overall size has increased as described above. Extension into the adjacent pelvic musculature has also worsened. The periosteal reaction along the anterior margin of the adjacent sacrum is mildly worsened suggesting osteomyelitis. This could represent an abscess and/or recurrent tumor. 3. Interval progression of pulmonary metastases. 4. 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 Loops of small bowel within the peristomal hernia are mildly prominent measuring up to 3 cm on coronal imaging. Recommend clinical correlation. 5. Atherosclerotic change in the aorta. Electronically Signed   By: Dorise Bullion III M.D   On: 04/07/2017 20:30      Assessment & Plan:    Principal Problem:   Leukocytosis Active Problems:   Hyponatremia   Hydronephrosis   Tachycardia    Fever ? Sepsis (leukocytosis, fever, tachycardia) Blood culture pending Urine culture pending Rocephin 1gm iv qday  Hydronephrosis IR consulted for  percutaneous nephrostomy NPO  Tachycardia Tele Trop I q6h x3, tsh Consider echo if not improving  DVT Prophylaxis Lovenox - SCDs  AM Labs Ordered, also please review Full Orders  Family Communication: Admission, patients condition and plan of care including tests being ordered have been discussed with the patient  who indicate understanding and agree with the plan and Code Status.  Code Status FULL CODE  Likely DC to  home  Condition GUARDED   Consults called: urology by ED, IR consult  Admission status: inpatient  Time spent in minutes : 45   Jani Gravel M.D on 04/07/2017 at 10:41 PM  Between 7am to 7pm - Pager - [phone] After 7pm go to [email or web address] - password [email or web address]  [facility] - Office  [phone]

## 2017-04-07 NOTE — ED Notes (Signed)
362ml urine emptied from foley bag

## 2017-04-07 NOTE — ED Provider Notes (Signed)
[facility] - Closed EMERGENCY DEPARTMENT Provider Note   CSN: [id number] Arrival date & time: 04/07/17  1436     History   Chief Complaint Chief Complaint  Patient presents with  . Back Pain    HPI Andrew Kline is a 51 y.o. male.  HPI   51yM with lower back pain. Worsening over past several days. B/l lower back extending into b/l buttocks. Denies radicular symptoms. Subjective fever. Complex surgical hx. Metastatic rectal CA s/p resection with prostatic urethral injury. 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    Past Medical History:  Diagnosis Date  . Anxiety   . Arthritis   . Depression   . Hospice care   . Kidney stone 03/22/15   left  . 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 Rectal pain   . Renal cell cancer ([place])    2012.   [name] Shortness of breath dyspnea   . Suprapubic catheter ([place])   . Ventral hernia    around ostomy site    Patient Active Problem List   Diagnosis Date Noted  . Parastomal hernia without obstruction or gangrene 01/16/2017  . Hydronephrosis of left kidney   . Abdominal pain 01/15/2017  . Depression with anxiety 01/15/2017  . Metastatic cancer ([place]) 01/15/2017  . Abscess 12/16/2016  . Abscess of buttock 12/15/2016  . Hypokalemia 10/01/2016  . Left buttock abscess 09/30/2016  . Perirectal abscess   . Urinary retention 03/28/2016  . Suprapubic catheter dysfunction ([place]) 03/28/2016  . Goals of care, counseling/discussion   . DNR (do not resuscitate) discussion   . Pyomyositis: Left gluteus maximus per CT 03/07/2016 03/08/2016  . Carcinoma of kidney ([place])   . Sepsis affecting skin ([place])   . Suprapubic catheter ([place])   . Uncontrolled pain   . Palliative care encounter   . Pelvic abscess in male [facility])   . Sepsis ([place]) 03/04/2016  . Renal cell carcinoma ([place]) 03/04/2016  . Hyponatremia 03/04/2016  . Complicated UTI (urinary tract infection) 01/25/2016  . Physical deconditioning 07/07/2015  .  Muscular deconditioning 07/07/2015  . Malnutrition of moderate degree ([place]) 02/13/2015  . Rectal cancer metastasized to lung [facility]) 10/30/2014    Past Surgical History:  Procedure Laterality Date  . BIOPSY N/A 09/30/2014   Procedure: BIOPSY;  Surgeon: Danie Binder, MD;  Location: [facility] ORS;  Service: Endoscopy;  Laterality: N/A;  Anal Canal  . COLOSTOMY N/A 02/11/2015   Procedure: COLOSTOMY;  Surgeon: Aviva Signs, MD;  Location: [facility] ORS;  Service: General;  Laterality: N/A;  . FLEXIBLE SIGMOIDOSCOPY N/A 09/30/2014   Procedure: FLEXIBLE SIGMOIDOSCOPY;  Surgeon: Danie Binder, MD;  Location: [facility] ORS;  Service: Endoscopy;  Laterality: N/A;  . FLEXIBLE SIGMOIDOSCOPY N/A 10/17/2014   Procedure: FLEXIBLE SIGMOIDOSCOPY;  Surgeon: Danie Binder, MD;  Location: [facility] ENDO SUITE;  Service: Endoscopy;  Laterality: N/A;  1325  . HYPOSPADIAS CORRECTION    . IR CATHETER TUBE CHANGE  11/06/2016  . IR GENERIC HISTORICAL  12/17/2015   IR CATHETER TUBE CHANGE 12/17/2015 [facility]INTER[facility]  . IR GENERIC HISTORICAL  01/27/2016   IR NEPHROSTOMY PLACEMENT RIGHT 01/27/2016 [facility]  . IR GENERIC HISTORICAL  01/27/2016   IR NEPHROSTOMY PLACEMENT LEFT 01/27/2016 [facility]  . IR GENERIC HISTORICAL  03/08/2016   IR NEPHROSTOMY EXCHANGE LEFT 03/08/2016 Greggory Keen, MD [facility]  . IR GENERIC HISTORICAL  03/08/2016   IR NEPHROSTOMY EXCHANGE RIGHT 03/08/2016 Greggory Keen, MD [facility]  .  IR GENERIC HISTORICAL  03/07/2016   IR CATHETER TUBE CHANGE 03/07/2016 [facility]  . IR GENERIC HISTORICAL  03/28/2016   IR CATHETER TUBE CHANGE 03/28/2016 Corrie Mckusick, DO [facility]  . IR GENERIC HISTORICAL  03/28/2016   IR NEPHROSTOMY TUBE CHANGE 03/28/2016 Corrie Mckusick, DO [facility]  . IR GENERIC HISTORICAL  06/16/2016   IR CATHETER TUBE CHANGE 06/16/2016 Aletta Edouard, MD [facility]  . IR GENERIC HISTORICAL  06/16/2016   IR NEPHROSTOGRAM LEFT THRU EXISTING ACCESS 06/16/2016 Aletta Edouard, MD [facility]  . KNEE  SURGERY Left    arthroscopy  . LAPAROSCOPIC PARTIAL NEPHRECTOMY  2012   right side  . LUNG BIOPSY Right   . PERINEAL PROCTECTOMY N/A 08/26/2015   Procedure:  PROCTECTOMY;  Surgeon: Aviva Signs, MD;  Location: [facility] ORS;  Service: General;  Laterality: N/A;  . RECTAL SURGERY         Home Medications    Prior to Admission medications   Medication Sig Start Date End Date Taking? Authorizing Provider  amitriptyline (ELAVIL) 50 MG tablet TAKE ONE TABLET BY MOUTH AT BEDTIME. 05/10/16  Yes Penland, Kelby Fam, MD  DULoxetine (CYMBALTA) 60 MG capsule Take 1 capsule (60 mg total) by mouth daily. 12/31/15  Yes Penland, Kelby Fam, MD  HYDROmorphone (DILAUDID) 4 MG tablet Take 4 mg by mouth every 6 (six) hours as needed (breakthrough pain).    Yes [provider]  morphine (MSIR) 30 MG tablet Take 30 mg by mouth 2 (two) times daily. Take 2 tablets by mouth twice a day   Yes [provider]  OXYGEN Inhale 4 L into the lungs as needed (shortness of breath).   Yes [provider]  predniSONE (DELTASONE) 20 MG tablet Take 20 mg by mouth daily with breakfast.   Yes [provider]  pregabalin (LYRICA) 150 MG capsule Take 150 mg by mouth 2 (two) times daily.   Yes [provider]  promethazine (PHENERGAN) 25 MG tablet Take 25 mg by mouth every 4 (four) hours as needed for nausea or vomiting.   Yes [provider]  temazepam (RESTORIL) 15 MG capsule Take 15 mg by mouth at bedtime.    Yes [provider]  Morphine Sulfate ER 15 MG T12A Take 15 mg by mouth See admin instructions. Take 1 tablet (15 mg) by mouth twice daily - with a 30 mg tablet for a 45 mg dose    [provider]    Family History Family History  Problem Relation Age of Onset  . Healthy Mother   . Lung cancer Maternal Grandfather   . Colon cancer Maternal Uncle        age 49  . Diabetes Other     Social History Social History   Tobacco Use  . Smoking status:  Current Every Day Smoker    Packs/day: 0.50    Years: 30.00    Pack years: 15.00    Types: Cigarettes  . Smokeless tobacco: Never Used  Substance Use Topics  . Alcohol use: Yes    Alcohol/week: 0.0 oz    Comment: Occ  . Drug use: No     Allergies   Oxaliplatin and Fentanyl   Review of Systems Review of Systems  All systems reviewed and negative, other than as noted in HPI.  Physical Exam Updated Vital Signs BP 123/69   Pulse 90   Temp 100 F (37.8 C)   Resp 18   Ht 5\' 5"  (1.651 m)  Wt 90.7 kg (200 lb)   SpO2 98%   BMI 33.28 kg/m   Physical Exam  Constitutional: He appears well-developed and well-nourished. No distress.  HENT:  Head: Normocephalic and atraumatic.  Eyes: Conjunctivae are normal. Right eye exhibits no discharge. Left eye exhibits no discharge.  Neck: Neck supple.  Cardiovascular: Normal rate, regular rhythm and normal heart sounds. Exam reveals no gallop and no friction rub.  No murmur heard. Pulmonary/Chest: Effort normal and breath sounds normal. No respiratory distress.  Abdominal: Soft. He exhibits no distension. There is tenderness.  Ostomy with healthy appearing stoma. Suprapubic catheter with pale yellow urine in bag. Large ventral hernia.   Musculoskeletal: He exhibits no edema or tenderness.  Neurological: He is alert.  Skin: Skin is warm and dry.  R buttock with induration and small draining tract. No fluctuance  Psychiatric: He has a normal mood and affect. His behavior is normal. Thought content normal.  Nursing note and vitals reviewed.    ED Treatments / Results  Labs (all labs ordered are listed, but only abnormal results are displayed) Labs Reviewed  COMPREHENSIVE METABOLIC PANEL - Abnormal; Notable for the following components:      Result Value   Sodium 132 (*)    Chloride 95 (*)    Glucose, Bld 180 (*)    Total Protein 8.4 (*)    Albumin 3.0 (*)    Alkaline Phosphatase 308 (*)    All other components within normal  limits  CBC WITH DIFFERENTIAL/PLATELET - Abnormal; Notable for the following components:   WBC 17.2 (*)    RDW 17.2 (*)    Platelets 593 (*)    Neutro Abs 14.0 (*)    Monocytes Absolute 1.5 (*)    All other components within normal limits  URINALYSIS, ROUTINE W REFLEX MICROSCOPIC - Abnormal; Notable for the following components:   APPearance HAZY (*)    Hgb urine dipstick SMALL (*)    Protein, ur 100 (*)    Leukocytes, UA LARGE (*)    Bacteria, UA MANY (*)    All other components within normal limits  I-STAT CG4 LACTIC ACID, ED - Abnormal; Notable for the following components:   Lactic Acid, Venous 2.11 (*)    All other components within normal limits  CULTURE, BLOOD (ROUTINE X 2)  CULTURE, BLOOD (ROUTINE X 2)  URINE CULTURE  PROTIME-INR  I-STAT CG4 LACTIC ACID, ED    EKG  EKG Interpretation None       Radiology Dg Chest 2 View  Result Date: 04/07/2017 CLINICAL DATA:  Back pain.  Fever, cough, and chills. EXAM: CHEST  2 VIEW COMPARISON:  03/04/2016. FINDINGS: PowerPort catheter noted looped in the right IJ with its tip tip over the upper superior vena cava. This is in a more superior location than on prior study of 03/04/2016. Heart size normal. Multifocal bilateral pulmonary infiltrates versus mass lesions. Bilateral pneumonia and/or metastatic disease could present in this fashion. These findings are new from prior exam. No pleural effusion or pneumothorax. No acute bony abnormality . IMPRESSION: 1. PowerPort catheter noted looped in the right IJ with its tip over the upper superior vena cava. 2. Multifocal bilateral pulmonary infiltrates and/or mass lesions. Findings consistent with multifocal bilateral pneumonia and/or metastatic disease. These findings are new from prior exam . Electronically Signed   By: [place]   On: 04/07/2017 17:11   Ct Abdomen Pelvis W Contrast  Result Date: 04/07/2017 CLINICAL DATA:  History of renal carcinoma in 2012.  Rectal carcinoma  status post colostomy and peroneal proctectomy in 2017. History of right partial nephrectomy. Suprapubic catheter. EXAM: CT ABDOMEN AND PELVIS WITH CONTRAST TECHNIQUE: Multidetector CT imaging of the abdomen and pelvis was performed using the standard protocol following bolus administration of intravenous contrast. CONTRAST:  170mL ISOVUE-300 IOPAMIDOL (ISOVUE-300) INJECTION 61% COMPARISON:  January 15, 2017 FINDINGS: Lower chest: Interval progression of pulmonary metastases. A representative metastasis in the right lower lobe on series 5, image 2 measures 3.1 cm today versus 2.9 cm previously. The previously measured metastasis in the medial right lower lobe on series 5, image 6 is now confluent with adjacent nodules making measurement difficult. However, it measures 4.2 today versus 3.6 cm previously. A lingular metastasis on image 7 measures 2.6 cm today versus 2.7 cm previously. There also appear to be a few new small metastases. No other changes in the soft tissues of the chest. Hepatobiliary: No focal liver abnormality is seen. No gallstones, gallbladder wall thickening, or biliary dilatation. Pancreas: Calcifications in the pancreas are consistent with previous pancreatitis. No peripancreatic stranding today to suggest acute pancreatitis. No pancreatic mass noted. Spleen: Normal in size without focal abnormality. Adrenals/Urinary Tract: The adrenal glands are normal. The patient is status post partial nephrectomy on the right at the lower pole. This site is unchanged with no evidence recurrence. No renal masses are seen. There is right-sided hydronephrosis which is moderate to severe. The right ureter is dilated over its proximal half. There is a discrete change in caliber in the pelvis as seen on series 2, image 65 and is not dilated beyond this point. No underlying cause identified. There is persistent moderate to severe hydronephrosis on the left and the left ureter is also dilated along the proximal 2/3  of its length. There is also an abrupt caliber change in the pelvis. The distal most aspect of the left ureter is not dilated. No ureteral stones identified. The bladder is decompressed with a suprapubic catheter which is stable. The perinephric stranding on the left has largely resolved. Delayed images demonstrate contrast pooling in the dilated renal calices. Stomach/Bowel: The stomach is normal in appearance. The small bowel is normal. The patient has a left lower quadrant ostomy with a peristomal hernia containing small bowel loops. These loops of small bowel within the peristomal hernia are borderline measuring up to 3 cm but there is no associated wall thickening. The portion of colon within the ostomy remains compressed into the right side of the ostomy sac by adjacent small bowel loops but this is stable. The remaining colon is normal. There is no convincing evidence of appendicitis. There is a collection with adjacent stranding in the pelvis which will be described below. The stranding abuts the distal appendix. However, the adjacent appendix is nondilated. Vascular/Lymphatic: Mild atherosclerotic changes in the nonaneurysmal aorta. The iliac vessels are also involved. The lymph nodes are normal. Reproductive: The patient is status post prostatectomy. The irregular fluid collection in the prostatectomy bed remains, demonstrating a thick enhancing wall with prominent surrounding fat stranding, again intimately involved with the pelvic floor musculature and posterior bladder wall. This collection is larger in the interval and appears multiloculated. The collection measures 8.6 by 6.8 cm on series 2, image 79. At this same level of the previous study, the collection measured 5.6 x 4.0 cm. There is also increased inferior extension with loculated components of fluid to both sides of the anal canal on series 2, image 84 which are much larger. The superior extension may  be slightly increased in the interval.  There appears to be involvement of the adjacent pelvic musculature which is worsened. The periosteal reaction along the anterior margin of the sacrum is probably mildly worsened in the interval. Other: No free air.  No peritoneal or omental disease identified. Musculoskeletal: Periosteal reaction along the anterior sacral margin as described above. No other bony changes. IMPRESSION: 1. There is now moderate to severe bilateral hydronephrosis, stable on the left and new on the right. There is an abrupt transition in both dilated ureters in the pelvis. No definitive underlying cause identified. No stone. The findings are consistent bilateral obstruction. The perinephric stranding on the left has almost completely resolved. 2. The chronic irregular thick walled fluid collection in the proctectomy bad has significantly enlarged and worsened since the comparison CT scan. The overall size has increased as described above. Extension into the adjacent pelvic musculature has also worsened. The periosteal reaction along the anterior margin of the adjacent sacrum is mildly worsened suggesting osteomyelitis. This could represent an abscess and/or recurrent tumor. 3. Interval progression of pulmonary metastases. 4. 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 Loops of small bowel within the peristomal hernia are mildly prominent measuring up to 3 cm on coronal imaging. Recommend clinical correlation. 5. Atherosclerotic change in the aorta. Electronically Signed   By: Dorise Bullion III M.D   On: 04/07/2017 20:30    Procedures Procedures (including critical care time)  Medications Ordered in ED Medications  sodium chloride 0.9 % bolus 1,000 mL (1,000 mLs Intravenous New Bag/Given 04/07/17 1725)  HYDROmorphone (DILAUDID) injection 1 mg (1 mg Intravenous Given 04/07/17 1725)  cefTRIAXone (ROCEPHIN) 1 g in dextrose 5 % 50 mL IVPB (0 g Intravenous Stopped 04/07/17 1755)  HYDROmorphone (DILAUDID) injection 1 mg  (1 mg Intravenous Given 04/07/17 1914)  iopamidol (ISOVUE-300) 61 % injection 100 mL (100 mLs Intravenous Contrast Given 04/07/17 1923)     Initial Impression / Assessment and Plan / ED Course  I have reviewed the triage vital signs and the nursing notes.  Pertinent labs & imaging results that were available during my care of the patient were reviewed by me and considered in my medical decision making (see chart for details).     51yM with increasing lower back pain and fever. Imaging as above. Discussed with urology, Dr Diona Fanti, who is familiar with patient but previous urologic interventions have been by IR. Recommending bilateral perc nephrostomy tubes. Pt not happy with this (he actually previously removed one himself) but says he'll do it because he isn't ready for comfort measures only.   Final Clinical Impressions(s) / ED Diagnoses   Final diagnoses:  Hydronephrosis  Pelvic mass  ED Discharge Orders    None       Virgel Manifold, MD 04/09/17 [phone]

## 2017-04-07 NOTE — ED Notes (Signed)
Patient would like something for pain at this time. RN made aware.

## 2017-04-08 ENCOUNTER — Inpatient Hospital Stay (HOSPITAL_COMMUNITY): Payer: Medicare Other

## 2017-04-08 ENCOUNTER — Encounter (HOSPITAL_COMMUNITY): Payer: Self-pay | Admitting: Interventional Radiology

## 2017-04-08 DIAGNOSIS — L0291 Cutaneous abscess, unspecified: Secondary | ICD-10-CM

## 2017-04-08 DIAGNOSIS — N390 Urinary tract infection, site not specified: Secondary | ICD-10-CM

## 2017-04-08 HISTORY — PX: IR NEPHROSTOMY PLACEMENT RIGHT: IMG6064

## 2017-04-08 HISTORY — PX: IR NEPHROSTOMY PLACEMENT LEFT: IMG6063

## 2017-04-08 LAB — TSH: TSH: 1.089 u[IU]/mL (ref 0.350–4.500)

## 2017-04-08 LAB — TROPONIN I: Troponin I: <0.03 ng/mL (ref <0.03)

## 2017-04-08 MED ORDER — MIDAZOLAM HCL 2 MG/2ML IJ SOLN
INTRAMUSCULAR | Status: AC
  Filled 2017-04-08: qty 6

## 2017-04-08 MED ORDER — LIDOCAINE HCL 1 % IJ SOLN
INTRAMUSCULAR | Status: AC
  Filled 2017-04-08: qty 20

## 2017-04-08 MED ORDER — HYDROMORPHONE HCL 1 MG/ML IJ SOLN
INTRAMUSCULAR | Status: AC | PRN
  Administered 2017-04-08 (×2): 0.5 mg via INTRAVENOUS

## 2017-04-08 MED ORDER — LIDOCAINE-EPINEPHRINE 1 %-1:100000 IJ SOLN
10.0000 mL | Freq: Once | INTRAMUSCULAR | Status: DC
  Filled 2017-04-08: qty 10

## 2017-04-08 MED ORDER — MIDAZOLAM HCL 2 MG/2ML IJ SOLN
INTRAMUSCULAR | Status: AC | PRN
  Administered 2017-04-08 (×4): 1 mg via INTRAVENOUS

## 2017-04-08 MED ORDER — HYDROMORPHONE HCL 2 MG PO TABS
4.0000 mg | ORAL_TABLET | Freq: Once | ORAL | Status: AC
  Administered 2017-04-08: 4 mg via ORAL
  Filled 2017-04-08: qty 2

## 2017-04-08 MED ORDER — HYDROMORPHONE HCL 1 MG/ML IJ SOLN
INTRAMUSCULAR | Status: AC
  Filled 2017-04-08: qty 1

## 2017-04-08 MED ORDER — LIDOCAINE HCL 1 % IJ SOLN
INTRAMUSCULAR | Status: AC | PRN
  Administered 2017-04-08: 5 mL

## 2017-04-08 MED ORDER — IOPAMIDOL (ISOVUE-300) INJECTION 61%
INTRAVENOUS | Status: AC
  Administered 2017-04-08: 15 mL
  Filled 2017-04-08: qty 100

## 2017-04-08 NOTE — Progress Notes (Signed)
[facility] PROGRESS NOTE  Andrew Kline ZOX:[id number] DOB: [birth year] DOA: 04/07/2017 PCP: Marco Collie, MD  Assessment/Plan:  UTI Sepsis (leukocytosis, fever, tachycardia) Blood culture pending Urine culture pending Rocephin 1gm iv qday  Abscess I& at bedside later today  Hydronephrosis IR consulted for percutaneous nephrostomy, for today NPO Received Lovenox last night   Code Status: FC Family Communication: no family at bedside (indicate person spoken with, relationship, and if by phone, the number) Disposition Plan: Home   Consultants:  none  Procedures:  none  Antibiotics:  Rocephin 12/7-->P (indicate start date, and stop date if known)  HPI/Subjective: No issues overnight. Complains of draining abscess.  Objective: Vitals:   04/08/17 0231 04/08/17 0407  BP:  114/63  Pulse:  87  Resp:  16  Temp: 98.9 F (37.2 C) 98.7 F (37.1 C)  SpO2:  98%    Intake/Output Summary (Last 24 hours) at 04/08/2017 0954 Last data filed at 04/08/2017 0914 Gross per 24 hour  Intake 2370 ml  Output 1450 ml  Net 920 ml   Filed Weights   04/07/17 1509 04/08/17 0407  Weight: 90.7 kg (200 lb) 87 kg (191 lb 12.8 oz)    Exam:   General:  NAD, NCAT  Cardiovascular: RRR, no MRG  Respiratory: CTAB, no WRR  Abdomen: ND, BS+  Musculoskeletal: moving all extr  Skin: draining abscess on L back   Colostomy with nl output  Suprapubic cath with nl output  Data Reviewed: Basic Metabolic Panel: Recent Labs  Lab 04/07/17 1520  NA 132*  K 4.2  CL 95*  CO2 26  GLUCOSE 180*  BUN 7  CREATININE 0.92  CALCIUM 9.2   Liver Function Tests: Recent Labs  Lab 04/07/17 1520  AST 23  ALT 47  ALKPHOS 308*  BILITOT 0.4  PROT 8.4*  ALBUMIN 3.0*   No results for input(s): LIPASE, AMYLASE in the last 168 hours. No results for input(s): AMMONIA in the last 168 hours. CBC: Recent Labs  Lab 04/07/17 1520  WBC 17.2*  NEUTROABS 14.0*  HGB 13.1  HCT 41.8   MCV 86.0  PLT 593*   Cardiac Enzymes: Recent Labs  Lab 04/07/17 2330  TROPONINI <0.03   BNP (last 3 results) No results for input(s): BNP in the last 8760 hours.  ProBNP (last 3 results) No results for input(s): PROBNP in the last 8760 hours.  CBG: No results for input(s): GLUCAP in the last 168 hours.  Recent Results (from the past 240 hour(s))  Culture, blood (Routine x 2)     Status: None (Preliminary result)   Collection Time: 04/07/17  3:20 PM  Result Value Ref Range Status   Specimen Description   Final    LEFT ANTECUBITAL BOTTLES DRAWN AEROBIC AND ANAEROBIC   Special Requests   Final    Blood Culture results may not be optimal due to an inadequate volume of blood received in culture bottles   Culture NO GROWTH < 24 HOURS  Final   Report Status PENDING  Incomplete  Culture, blood (Routine x 2)     Status: None (Preliminary result)   Collection Time: 04/07/17  3:20 PM  Result Value Ref Range Status   Specimen Description   Final    RIGHT ANTECUBITAL BOTTLES DRAWN AEROBIC AND ANAEROBIC   Special Requests Blood Culture adequate volume  Final   Culture NO GROWTH < 24 HOURS  Final   Report Status PENDING  Incomplete     Studies: Dg Chest 2 View  Result  Date: 04/07/2017 CLINICAL DATA:  Back pain.  Fever, cough, and chills. EXAM: CHEST  2 VIEW COMPARISON:  03/04/2016. FINDINGS: PowerPort catheter noted looped in the right IJ with its tip tip over the upper superior vena cava. This is in a more superior location than on prior study of 03/04/2016. Heart size normal. Multifocal bilateral pulmonary infiltrates versus mass lesions. Bilateral pneumonia and/or metastatic disease could present in this fashion. These findings are new from prior exam. No pleural effusion or pneumothorax. No acute bony abnormality . IMPRESSION: 1. PowerPort catheter noted looped in the right IJ with its tip over the upper superior vena cava. 2. Multifocal bilateral pulmonary infiltrates and/or mass  lesions. Findings consistent with multifocal bilateral pneumonia and/or metastatic disease. These findings are new from prior exam . Electronically Signed   By: [place]   On: 04/07/2017 17:11   Ct Abdomen Pelvis W Contrast  Result Date: 04/07/2017 CLINICAL DATA:  History of renal carcinoma in 2012. 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 History of right partial nephrectomy. Suprapubic catheter. EXAM: CT ABDOMEN AND PELVIS WITH CONTRAST TECHNIQUE: Multidetector CT imaging of the abdomen and pelvis was performed using the standard protocol following bolus administration of intravenous contrast. CONTRAST:  145mL ISOVUE-300 IOPAMIDOL (ISOVUE-300) INJECTION 61% COMPARISON:  January 15, 2017 FINDINGS: Lower chest: Interval progression of pulmonary metastases. A representative metastasis in the right lower lobe on series 5, image 2 measures 3.1 cm today versus 2.9 cm previously. The previously measured metastasis in the medial right lower lobe on series 5, image 6 is now confluent with adjacent nodules making measurement difficult. However, it measures 4.2 today versus 3.6 cm previously. A lingular metastasis on image 7 measures 2.6 cm today versus 2.7 cm previously. There also appear to be a few new small metastases. No other changes in the soft tissues of the chest. Hepatobiliary: No focal liver abnormality is seen. No gallstones, gallbladder wall thickening, or biliary dilatation. Pancreas: Calcifications in the pancreas are consistent with previous pancreatitis. No peripancreatic stranding today to suggest acute pancreatitis. No pancreatic mass noted. Spleen: Normal in size without focal abnormality. Adrenals/Urinary Tract: The adrenal glands are normal. The patient is status post partial nephrectomy on the right at the lower pole. This site is unchanged with no evidence recurrence. No renal masses are seen. There is right-sided hydronephrosis which is moderate to  severe. The right ureter is dilated over its proximal half. There is a discrete change in caliber in the pelvis as seen on series 2, image 65 and is not dilated beyond this point. No underlying cause identified. There is persistent moderate to severe hydronephrosis on the left and the left ureter is also dilated along the proximal 2/3 of its length. There is also an abrupt caliber change in the pelvis. The distal most aspect of the left ureter is not dilated. No ureteral stones identified. The bladder is decompressed with a suprapubic catheter which is stable. The perinephric stranding on the left has largely resolved. Delayed images demonstrate contrast pooling in the dilated renal calices. Stomach/Bowel: The stomach is normal in appearance. The small bowel is normal. The patient has a left lower quadrant ostomy with a peristomal hernia containing small bowel loops. These loops of small bowel within the peristomal hernia are borderline measuring up to 3 cm but there is no associated wall thickening. The portion of colon within the ostomy remains compressed into the right side of the ostomy sac by adjacent small bowel loops but this  is stable. The remaining colon is normal. There is no convincing evidence of appendicitis. There is a collection with adjacent stranding in the pelvis which will be described below. The stranding abuts the distal appendix. However, the adjacent appendix is nondilated. Vascular/Lymphatic: Mild atherosclerotic changes in the nonaneurysmal aorta. The iliac vessels are also involved. The lymph nodes are normal. Reproductive: The patient is status post prostatectomy. The irregular fluid collection in the prostatectomy bed remains, demonstrating a thick enhancing wall with prominent surrounding fat stranding, again intimately involved with the pelvic floor musculature and posterior bladder wall. This collection is larger in the interval and appears multiloculated. The collection measures 8.6  by 6.8 cm on series 2, image 79. At this same level of the previous study, the collection measured 5.6 x 4.0 cm. There is also increased inferior extension with loculated components of fluid to both sides of the anal canal on series 2, image 84 which are much larger. The superior extension may be slightly increased in the interval. There appears to be involvement of the adjacent pelvic musculature which is worsened. The periosteal reaction along the anterior margin of the sacrum is probably mildly worsened in the interval. Other: No free air.  No peritoneal or omental disease identified. Musculoskeletal: Periosteal reaction along the anterior sacral margin as described above. No other bony changes. IMPRESSION: 1. There is now moderate to severe bilateral hydronephrosis, stable on the left and new on the right. There is an abrupt transition in both dilated ureters in the pelvis. No definitive underlying cause identified. No stone. The findings are consistent bilateral obstruction. The perinephric stranding on the left has almost completely resolved. 2. The chronic irregular thick walled fluid collection in the proctectomy bad has significantly enlarged and worsened since the comparison CT scan. The overall size has increased as described above. Extension into the adjacent pelvic musculature has also worsened. The periosteal reaction along the anterior margin of the adjacent sacrum is mildly worsened suggesting osteomyelitis. This could represent an abscess and/or recurrent tumor. 3. Interval progression of pulmonary metastases. 4. 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 Loops of small bowel within the peristomal hernia are mildly prominent measuring up to 3 cm on coronal imaging. Recommend clinical correlation. 5. Atherosclerotic change in the aorta. Electronically Signed   By: Dorise Bullion III M.D   On: 04/07/2017 20:30    Scheduled Meds: . amitriptyline  50 mg Oral QHS  . DULoxetine  60  mg Oral Daily  . iopamidol      . lidocaine      . lidocaine-EPINEPHrine  10 mL Infiltration Once  . pregabalin  150 mg Oral BID  . temazepam  15 mg Oral QHS   Continuous Infusions: . sodium chloride 100 mL/hr at 04/08/17 0921  . cefTRIAXone (ROCEPHIN)  IV Stopped (04/08/17 0233)    Principal Problem:   Leukocytosis Active Problems:   Hyponatremia   Hydronephrosis   Tachycardia    Time spent: [facility] Pager [place]. If 7PM-7AM, please contact night-coverage at [email or web address], password [facility] 04/08/2017, 9:54 AM  LOS: 1 day

## 2017-04-08 NOTE — ED Notes (Signed)
Report to [facility] on unit to transport pt

## 2017-04-08 NOTE — Sedation Documentation (Signed)
Procedure postponed.

## 2017-04-08 NOTE — ED Notes (Signed)
Report to [facility] [place], RN [facility]

## 2017-04-08 NOTE — Progress Notes (Signed)
Patient returned from IR, very sleepy however is arousable.  VSS. Bilateral nephrostomy tubes and sites clean dry and intact.  Bed alarm on.  Will continue to monitor.

## 2017-04-08 NOTE — Procedures (Signed)
Interventional Radiology Procedure Note  Procedure: Successful placement of bilateral 60F PCNs.   Complications: None  Estimated Blood Loss: None  Recommendations: - Drains to gravity - Return to IR in 8 weeks for tube change  Signed,  Criselda Peaches, MD

## 2017-04-08 NOTE — Consult Note (Signed)
Chief Complaint: Patient was seen in consultation today for back pain  Referring Physician(s): Dr. Diona Fanti  Supervising Physician: Jacqulynn Cadet  Patient Status: [facility] - In-pt  History of Present Illness: Andrew Kline is a 51 y.o. 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  He states the nephrostomy tubes were removed.  He was last seen for a nephrostomy tube change in IR in July 2018.  His suprapubic catheter remains in place.  Patient presented to [facility] yesterday with 1-2 week history of back pain.   CT Abdomen/Pelvis: 1. There is now moderate to severe bilateral hydronephrosis, stable on the left and new on the right. There is an abrupt transition in both dilated ureters in the pelvis. No definitive underlying cause identified. No stone. The findings are consistent bilateral obstruction. The perinephric stranding on the left has almost completely resolved. 2. The chronic irregular thick walled fluid collection in the proctectomy bad has significantly enlarged and worsened since the comparison CT scan. The overall size has increased as described above. Extension into the adjacent pelvic musculature has also worsened. The periosteal reaction along the anterior margin of the adjacent sacrum is mildly worsened suggesting osteomyelitis. This could represent an abscess and/or recurrent tumor. 3. Interval progression of pulmonary metastases. 4. 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 Loops of small bowel within the peristomal hernia are mildly prominent measuring up to 3 cm on coronal imaging. Recommend clinical Correlation.  IR consulted for bilateral percutaneous nephrostomy tube placement at the request of Dr. Zannie Cove.  He has  been NPO.  Last dose of lovenox was last PM at 2330.  Past Medical History:  Diagnosis Date  . Anxiety   . Arthritis   . Depression   . Hospice care   . Kidney stone 03/22/15   left  . 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 Rectal pain   . Renal cell cancer ([place])    2012.   [name] Shortness of breath dyspnea   . Suprapubic catheter ([place])   . Ventral hernia    around ostomy site    Past Surgical History:  Procedure Laterality Date  . BIOPSY N/A 09/30/2014   Procedure: BIOPSY;  Surgeon: Danie Binder, MD;  Location: [facility];  Service: Endoscopy;  Laterality: N/A;  Anal Canal  . COLOSTOMY N/A 02/11/2015   Procedure: COLOSTOMY;  Surgeon: Aviva Signs, MD;  Location: [facility];  Service: General;  Laterality: N/A;  . FLEXIBLE SIGMOIDOSCOPY N/A 09/30/2014   Procedure: FLEXIBLE SIGMOIDOSCOPY;  Surgeon: Danie Binder, MD;  Location: [facility];  Service: Endoscopy;  Laterality: N/A;  . FLEXIBLE SIGMOIDOSCOPY N/A 10/17/2014   Procedure: FLEXIBLE SIGMOIDOSCOPY;  Surgeon: Danie Binder, MD;  Location: [facility] SUITE;  Service: Endoscopy;  Laterality: N/A;  1325  . HYPOSPADIAS CORRECTION    . IR CATHETER TUBE CHANGE  11/06/2016  . IR GENERIC HISTORICAL  12/17/2015   IR CATHETER TUBE CHANGE 12/17/2015 [facility]INTER[facility] RAD  . IR GENERIC HISTORICAL  01/27/2016   IR NEPHROSTOMY PLACEMENT RIGHT 01/27/2016 [facility]  . IR GENERIC HISTORICAL  01/27/2016   IR NEPHROSTOMY PLACEMENT LEFT 01/27/2016 [facility]  . IR GENERIC HISTORICAL  03/08/2016   IR NEPHROSTOMY EXCHANGE LEFT 03/08/2016 Greggory Keen, MD [facility]  . IR GENERIC  HISTORICAL  03/08/2016   IR NEPHROSTOMY EXCHANGE RIGHT 03/08/2016 Greggory Keen, MD [facility]  . IR GENERIC HISTORICAL  03/07/2016   IR CATHETER TUBE CHANGE 03/07/2016 [facility]  . IR GENERIC HISTORICAL  03/28/2016   IR CATHETER TUBE CHANGE 03/28/2016 Corrie Mckusick, DO [facility]  . IR GENERIC HISTORICAL  03/28/2016   IR NEPHROSTOMY TUBE CHANGE  03/28/2016 Corrie Mckusick, DO [facility]  . IR GENERIC HISTORICAL  06/16/2016   IR CATHETER TUBE CHANGE 06/16/2016 Aletta Edouard, MD [facility]  . IR GENERIC HISTORICAL  06/16/2016   IR NEPHROSTOGRAM LEFT THRU EXISTING ACCESS 06/16/2016 Aletta Edouard, MD [facility]  . KNEE SURGERY Left    arthroscopy  . LAPAROSCOPIC PARTIAL NEPHRECTOMY  2012   right side  . LUNG BIOPSY Right   . PERINEAL PROCTECTOMY N/A 08/26/2015   Procedure:  PROCTECTOMY;  Surgeon: Aviva Signs, MD;  Location: [facility]S;  Service: General;  Laterality: N/A;  . RECTAL SURGERY      Allergies: Oxaliplatin and Fentanyl  Medications: Prior to Admission medications   Medication Sig Start Date End Date Taking? Authorizing Provider  amitriptyline (ELAVIL) 50 MG tablet TAKE ONE TABLET BY MOUTH AT BEDTIME. 05/10/16  Yes Penland, Kelby Fam, MD  DULoxetine (CYMBALTA) 60 MG capsule Take 1 capsule (60 mg total) by mouth daily. 12/31/15  Yes Penland, Kelby Fam, MD  HYDROmorphone (DILAUDID) 4 MG tablet Take 4 mg by mouth every 6 (six) hours as needed (breakthrough pain).    Yes [provider]  morphine (MSIR) 30 MG tablet Take 30 mg by mouth 2 (two) times daily. Take 2 tablets by mouth twice a day   Yes [provider]  OXYGEN Inhale 4 L into the lungs as needed (shortness of breath).   Yes [provider]  predniSONE (DELTASONE) 20 MG tablet Take 20 mg by mouth daily with breakfast.   Yes [provider]  pregabalin (LYRICA) 150 MG capsule Take 150 mg by mouth 2 (two) times daily.   Yes [provider]  promethazine (PHENERGAN) 25 MG tablet Take 25 mg by mouth every 4 (four) hours as needed for nausea or vomiting.   Yes [provider]  temazepam (RESTORIL) 15 MG capsule Take 15 mg by mouth at bedtime.    Yes [provider]  Morphine Sulfate ER 15 MG T12A Take 15 mg by mouth See admin instructions. Take 1 tablet (15 mg) by mouth twice daily - with a 30 mg tablet for a 45  mg dose    [provider]     Family History  Problem Relation Age of Onset  . Healthy Mother   . Lung cancer Maternal Grandfather   . Colon cancer Maternal Uncle        age 34  . Diabetes Other     Social History   Socioeconomic History  . Marital status: Married    Spouse name: None  . Number of children: 2  . Years of education: None  . Highest education level: None  Social Needs  . Financial resource strain: None  . Food insecurity - worry: None  . Food insecurity - inability: None  . Transportation needs - medical: None  . Transportation needs - non-medical: None  Occupational History  . Occupation: disabled  Tobacco Use  . Smoking status: Current Every Day Smoker    Packs/day: 0.50    Years: 30.00    Pack years: 15.00    Types: Cigarettes  . Smokeless tobacco:  Never Used  Substance and Sexual Activity  . Alcohol use: Yes    Alcohol/week: 0.0 oz    Comment: Occ  . Drug use: No  . Sexual activity: Yes    Birth control/protection: None  Other Topics Concern  . None  Social History Narrative  . None    Review of Systems  Constitutional: Negative for fatigue and fever.  Respiratory: Negative for cough and shortness of breath.   Cardiovascular: Negative for chest pain.  Gastrointestinal: Negative for abdominal pain.  Musculoskeletal: Positive for back pain.  Psychiatric/Behavioral: Negative for behavioral problems and confusion.    Vital Signs: BP 114/63 (BP Location: Right Arm)   Pulse 87   Temp 98.7 F (37.1 C) (Oral)   Resp 16   Ht 5\' 5"  (1.651 m)   Wt 191 lb 12.8 oz (87 kg)   SpO2 98%   BMI 31.92 kg/m   Physical Exam  Constitutional: He is oriented to person, place, and time. He appears well-developed.  Cardiovascular: Normal rate, regular rhythm and normal heart sounds.  Pulmonary/Chest: Effort normal and breath sounds normal. No respiratory distress.  Abdominal: Soft.  Genitourinary:  Genitourinary Comments: Suprapubic  catheter in place.  Clear, yellow urine in bag.  Neurological: He is alert and oriented to person, place, and time.  Skin: Skin is warm and dry.  Psychiatric: He has a normal mood and affect. His behavior is normal. Judgment and thought content normal.  Nursing note and vitals reviewed.   Imaging: Dg Chest 2 View  Result Date: 04/07/2017 CLINICAL DATA:  Back pain.  Fever, cough, and chills. EXAM: CHEST  2 VIEW COMPARISON:  03/04/2016. FINDINGS: PowerPort catheter noted looped in the right IJ with its tip tip over the upper superior vena cava. This is in a more superior location than on prior study of 03/04/2016. Heart size normal. Multifocal bilateral pulmonary infiltrates versus mass lesions. Bilateral pneumonia and/or metastatic disease could present in this fashion. These findings are new from prior exam. No pleural effusion or pneumothorax. No acute bony abnormality . IMPRESSION: 1. PowerPort catheter noted looped in the right IJ with its tip over the upper superior vena cava. 2. Multifocal bilateral pulmonary infiltrates and/or mass lesions. Findings consistent with multifocal bilateral pneumonia and/or metastatic disease. These findings are new from prior exam . Electronically Signed   By: [place]   On: 04/07/2017 17:11   Ct Abdomen Pelvis W Contrast  Result Date: 04/07/2017 CLINICAL DATA:  History of renal carcinoma in 2012. 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 History of right partial nephrectomy. Suprapubic catheter. EXAM: CT ABDOMEN AND PELVIS WITH CONTRAST TECHNIQUE: Multidetector CT imaging of the abdomen and pelvis was performed using the standard protocol following bolus administration of intravenous contrast. CONTRAST:  150mL ISOVUE-300 IOPAMIDOL (ISOVUE-300) INJECTION 61% COMPARISON:  January 15, 2017 FINDINGS: Lower chest: Interval progression of pulmonary metastases. A representative metastasis in the right lower lobe on series 5, image 2  measures 3.1 cm today versus 2.9 cm previously. The previously measured metastasis in the medial right lower lobe on series 5, image 6 is now confluent with adjacent nodules making measurement difficult. However, it measures 4.2 today versus 3.6 cm previously. A lingular metastasis on image 7 measures 2.6 cm today versus 2.7 cm previously. There also appear to be a few new small metastases. No other changes in the soft tissues of the chest. Hepatobiliary: No focal liver abnormality is seen. No gallstones, gallbladder wall thickening, or biliary dilatation. Pancreas:  Calcifications in the pancreas are consistent with previous pancreatitis. No peripancreatic stranding today to suggest acute pancreatitis. No pancreatic mass noted. Spleen: Normal in size without focal abnormality. Adrenals/Urinary Tract: The adrenal glands are normal. The patient is status post partial nephrectomy on the right at the lower pole. This site is unchanged with no evidence recurrence. No renal masses are seen. There is right-sided hydronephrosis which is moderate to severe. The right ureter is dilated over its proximal half. There is a discrete change in caliber in the pelvis as seen on series 2, image 65 and is not dilated beyond this point. No underlying cause identified. There is persistent moderate to severe hydronephrosis on the left and the left ureter is also dilated along the proximal 2/3 of its length. There is also an abrupt caliber change in the pelvis. The distal most aspect of the left ureter is not dilated. No ureteral stones identified. The bladder is decompressed with a suprapubic catheter which is stable. The perinephric stranding on the left has largely resolved. Delayed images demonstrate contrast pooling in the dilated renal calices. Stomach/Bowel: The stomach is normal in appearance. The small bowel is normal. The patient has a left lower quadrant ostomy with a peristomal hernia containing small bowel loops. These loops  of small bowel within the peristomal hernia are borderline measuring up to 3 cm but there is no associated wall thickening. The portion of colon within the ostomy remains compressed into the right side of the ostomy sac by adjacent small bowel loops but this is stable. The remaining colon is normal. There is no convincing evidence of appendicitis. There is a collection with adjacent stranding in the pelvis which will be described below. The stranding abuts the distal appendix. However, the adjacent appendix is nondilated. Vascular/Lymphatic: Mild atherosclerotic changes in the nonaneurysmal aorta. The iliac vessels are also involved. The lymph nodes are normal. Reproductive: The patient is status post prostatectomy. The irregular fluid collection in the prostatectomy bed remains, demonstrating a thick enhancing wall with prominent surrounding fat stranding, again intimately involved with the pelvic floor musculature and posterior bladder wall. This collection is larger in the interval and appears multiloculated. The collection measures 8.6 by 6.8 cm on series 2, image 79. At this same level of the previous study, the collection measured 5.6 x 4.0 cm. There is also increased inferior extension with loculated components of fluid to both sides of the anal canal on series 2, image 84 which are much larger. The superior extension may be slightly increased in the interval. There appears to be involvement of the adjacent pelvic musculature which is worsened. The periosteal reaction along the anterior margin of the sacrum is probably mildly worsened in the interval. Other: No free air.  No peritoneal or omental disease identified. Musculoskeletal: Periosteal reaction along the anterior sacral margin as described above. No other bony changes. IMPRESSION: 1. There is now moderate to severe bilateral hydronephrosis, stable on the left and new on the right. There is an abrupt transition in both dilated ureters in the pelvis. No  definitive underlying cause identified. No stone. The findings are consistent bilateral obstruction. The perinephric stranding on the left has almost completely resolved. 2. The chronic irregular thick walled fluid collection in the proctectomy bad has significantly enlarged and worsened since the comparison CT scan. The overall size has increased as described above. Extension into the adjacent pelvic musculature has also worsened. The periosteal reaction along the anterior margin of the adjacent sacrum is mildly worsened suggesting  osteomyelitis. This could represent an abscess and/or recurrent tumor. 3. Interval progression of pulmonary metastases. 4. 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 Loops of small bowel within the peristomal hernia are mildly prominent measuring up to 3 cm on coronal imaging. Recommend clinical correlation. 5. Atherosclerotic change in the aorta. Electronically Signed   By: Dorise Bullion III M.D   On: 04/07/2017 20:30    Labs:  CBC: Recent Labs    01/16/17 4403 01/17/17 0654 01/18/17 0629 04/07/17 1520  WBC 14.7* 14.9* 13.6* 17.2*  HGB 13.7 12.6* 11.8* 13.1  HCT 41.9 39.9 36.8* 41.8  PLT 367 315 274 593*    COAGS: Recent Labs    04/07/17 1520  INR 1.15    BMP: Recent Labs    01/16/17 0633 01/17/17 0654 01/18/17 0629 04/07/17 1520  NA 137 133* 134* 132*  K 3.7 3.5 3.2* 4.2  CL 103 100* 102 95*  CO2 25 26 25 26   GLUCOSE 137* 185* 183* 180*  BUN 8 9 10 7   CALCIUM 8.3* 8.3* 8.1* 9.2  CREATININE 1.01 1.03 0.95 0.92  GFRNONAA >60 >60 >60 >60  GFRAA >60 >60 >60 >60    LIVER FUNCTION TESTS: Recent Labs    10/01/16 0531 12/17/16 0530 01/15/17 1739 04/07/17 1520  BILITOT 0.4 0.3 0.3 0.4  AST 31 18 14* 23  ALT 31 16* 11* 47  ALKPHOS 219* 157* 98 308*  PROT 6.2* 6.5 7.5 8.4*  ALBUMIN 2.0* 2.2* 3.1* 3.0*    TUMOR MARKERS: No results for input(s): AFPTM, CEA, CA199, CHROMGRNA in the last 8760 hours.  Assessment and  Plan: Bilateral hydronephrosis Patient with extensive history of abdominal and pelvic disease s/p multiple surgeries and interventions.  He had bilateral nephrostomy tubes placed in IR in Feb 2018 which have since been removed.  CT shows bilateral hydronephrosis.  IR has been consulted for bilateral nephrostomy tubes replacement.He dose have signs of infection including fever, chills, and elevated WBC but the source is not clear this is related to urinary obstruction vs. Abscess in the area of his large pelvic mass.  A urine culture has been ordered, but is currently pending.  He does have hazy, yellow output from his suprapubic tube.  His SCr is 0.9.Discussed case with Dr. Laurence Ferrari who approves patient for procedure. However, will also discuss further with medical team given that percutaneous tubes may not alleviate his back pain.  He is on IV Flagyl and Rocephin.   Risks and benefits were discussed with the patient including, but not limited to, infection, bleeding, significant bleeding causing loss or decrease in renal function or damage to adjacent structures.  All of the patient's questions were answered, patient is agreeable to proceed. Consent signed and in chart.  Thank you for this interesting consult.  I greatly enjoyed meeting Andrew Kline and look forward to participating in their care.  A copy of this report was sent to the requesting provider on this date.  Electronically Signed: Docia Barrier, PA 04/08/2017, 9:12 AM   I spent a total of 40 Minutes    in face to face in clinical consultation, greater than 50% of which was counseling/coordinating care for bilateral hydronephrosis.

## 2017-04-08 NOTE — Progress Notes (Signed)
Dr. Aggie Moats made aware of pus-like drainage from wound on his left buttock.

## 2017-04-08 NOTE — ED Notes (Signed)
Pt off unit via [facility]

## 2017-04-09 DIAGNOSIS — F418 Other specified anxiety disorders: Secondary | ICD-10-CM

## 2017-04-09 LAB — BASIC METABOLIC PANEL
Anion gap: 10 (ref 5–15)
BUN: 10 mg/dL (ref 6–20)
CHLORIDE: 97 mmol/L — AB (ref 101–111)
CO2: 22 mmol/L (ref 22–32)
Calcium: 8.1 mg/dL — ABNORMAL LOW (ref 8.9–10.3)
Creatinine, Ser: 1.17 mg/dL (ref 0.61–1.24)
GFR calc non Af Amer: >60 mL/min (ref >60)
Glucose, Bld: 255 mg/dL — ABNORMAL HIGH (ref 65–99)
POTASSIUM: 4.2 mmol/L (ref 3.5–5.1)
SODIUM: 129 mmol/L — AB (ref 135–145)

## 2017-04-09 LAB — CBC WITH DIFFERENTIAL/PLATELET
Basophils Absolute: 0 10*3/uL (ref 0.0–0.1)
Basophils Relative: 0 %
EOS PCT: 3 %
Eosinophils Absolute: 0.3 10*3/uL (ref 0.0–0.7)
HEMATOCRIT: 35.8 % — AB (ref 39.0–52.0)
HEMOGLOBIN: 11.5 g/dL — AB (ref 13.0–17.0)
LYMPHS PCT: 11 %
Lymphs Abs: 1.3 10*3/uL (ref 0.7–4.0)
MCH: 27.1 pg (ref 26.0–34.0)
MCHC: 32.1 g/dL (ref 30.0–36.0)
MCV: 84.2 fL (ref 78.0–100.0)
MONO ABS: 1.2 10*3/uL — AB (ref 0.1–1.0)
Monocytes Relative: 10 %
Neutro Abs: 9.3 10*3/uL — ABNORMAL HIGH (ref 1.7–7.7)
Neutrophils Relative %: 76 %
Platelets: 514 10*3/uL — ABNORMAL HIGH (ref 150–400)
RBC: 4.25 MIL/uL (ref 4.22–5.81)
RDW: 17.3 % — ABNORMAL HIGH (ref 11.5–15.5)
WBC: 12.2 10*3/uL — ABNORMAL HIGH (ref 4.0–10.5)

## 2017-04-09 MED ORDER — SODIUM CHLORIDE 0.9 % IV SOLN
INTRAVENOUS | Status: AC
  Administered 2017-04-09: 13:00:00 via INTRAVENOUS

## 2017-04-09 MED ORDER — OXYCODONE HCL ER 10 MG PO T12A
10.0000 mg | EXTENDED_RELEASE_TABLET | Freq: Two times a day (BID) | ORAL | Status: DC
  Administered 2017-04-09 – 2017-04-11 (×6): 10 mg via ORAL
  Filled 2017-04-09 (×6): qty 1

## 2017-04-09 MED ORDER — CEFTRIAXONE SODIUM 2 G IJ SOLR
2.0000 g | INTRAMUSCULAR | Status: DC
  Administered 2017-04-09: 2 g via INTRAVENOUS
  Filled 2017-04-09: qty 2

## 2017-04-09 MED ORDER — METRONIDAZOLE IN NACL 5-0.79 MG/ML-% IV SOLN
500.0000 mg | Freq: Three times a day (TID) | INTRAVENOUS | Status: DC
  Administered 2017-04-09 – 2017-04-11 (×6): 500 mg via INTRAVENOUS
  Filled 2017-04-09 (×6): qty 100

## 2017-04-09 NOTE — Consult Note (Signed)
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  He is afebrile.  CBC is pending.  There is nothing for [place] to do about this.  If it is spontaneously draining that is fine.  BUt if it is causing pain, a simple I&D at the bedside is not contraindicated.  Kathryne Eriksson. Dahlia Bailiff, MD, [place] [phone] [facility]

## 2017-04-09 NOTE — Consult Note (Signed)
Reason for Consult:Perirectal/gluteal abscess Referring Physician: Dr. Tessa Lerner Andrew Kline is an 50 y.o. male.  HPI: In April of 2017 the patient underwent a palliative APR for metastatic rectal cancer.   This procedure was complicated by a urethral injury from which he is still having complications, and now has multiple nephrostomy tubes in place.  I was call today because of a right sided gluteal/perirectal.abscess which is probably connected to a complex pelvic heterogenous mass that may have tumor and purulent fluid.   Past Medical History:  Diagnosis Date  . Anxiety   . Arthritis   . Depression   . Hospice care   . Kidney stone 03/22/15   left  . 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 Rectal pain   . Renal cell cancer ([place])    2012.   [name] Shortness of breath dyspnea   . Suprapubic catheter ([place])   . Ventral hernia    around ostomy site    Past Surgical History:  Procedure Laterality Date  . BIOPSY N/A 09/30/2014   Procedure: BIOPSY;  Surgeon: Danie Binder, MD;  Location: [facility];  Service: Endoscopy;  Laterality: N/A;  Anal Canal  . COLOSTOMY N/A 02/11/2015   Procedure: COLOSTOMY;  Surgeon: Aviva Signs, MD;  Location: [facility];  Service: General;  Laterality: N/A;  . FLEXIBLE SIGMOIDOSCOPY N/A 09/30/2014   Procedure: FLEXIBLE SIGMOIDOSCOPY;  Surgeon: Danie Binder, MD;  Location: [facility];  Service: Endoscopy;  Laterality: N/A;  . FLEXIBLE SIGMOIDOSCOPY N/A 10/17/2014   Procedure: FLEXIBLE SIGMOIDOSCOPY;  Surgeon: Danie Binder, MD;  Location: [facility];  Service: Endoscopy;  Laterality: N/A;  1325  . HYPOSPADIAS CORRECTION    . IR CATHETER TUBE CHANGE  11/06/2016  . IR GENERIC HISTORICAL  12/17/2015   IR CATHETER TUBE CHANGE 12/17/2015 [facility]  . IR GENERIC HISTORICAL  01/27/2016   IR NEPHROSTOMY PLACEMENT RIGHT 01/27/2016 [facility]  . IR GENERIC HISTORICAL  01/27/2016   IR NEPHROSTOMY PLACEMENT LEFT 01/27/2016 [facility]  . IR  GENERIC HISTORICAL  03/08/2016   IR NEPHROSTOMY EXCHANGE LEFT 03/08/2016 Greggory Keen, MD [facility]  . IR GENERIC HISTORICAL  03/08/2016   IR NEPHROSTOMY EXCHANGE RIGHT 03/08/2016 Greggory Keen, MD [facility]  . IR GENERIC HISTORICAL  03/07/2016   IR CATHETER TUBE CHANGE 03/07/2016 [facility]  . IR GENERIC HISTORICAL  03/28/2016   IR CATHETER TUBE CHANGE 03/28/2016 Corrie Mckusick, DO [facility]  . IR GENERIC HISTORICAL  03/28/2016   IR NEPHROSTOMY TUBE CHANGE 03/28/2016 Corrie Mckusick, DO [facility]  . IR GENERIC HISTORICAL  06/16/2016   IR CATHETER TUBE CHANGE 06/16/2016 Aletta Edouard, MD [facility]  . IR GENERIC HISTORICAL  06/16/2016   IR NEPHROSTOGRAM LEFT THRU EXISTING ACCESS 06/16/2016 Aletta Edouard, MD [facility]  . IR NEPHROSTOMY PLACEMENT LEFT  04/08/2017  . IR NEPHROSTOMY PLACEMENT RIGHT  04/08/2017  . KNEE SURGERY Left    arthroscopy  . LAPAROSCOPIC PARTIAL NEPHRECTOMY  2012   right side  . LUNG BIOPSY Right   . PERINEAL PROCTECTOMY N/A 08/26/2015   Procedure:  PROCTECTOMY;  Surgeon: Aviva Signs, MD;  Location: [facility];  Service: General;  Laterality: N/A;  . RECTAL SURGERY      Family History  Problem Relation Age of Onset  . Healthy Mother   . Lung cancer Maternal Grandfather   . Colon cancer Maternal Uncle        age 81  .  Diabetes Other     Social History:  reports that he has been smoking cigarettes.  He has a 15.00 pack-year smoking history. he has never used smokeless tobacco. He reports that he drinks alcohol. He reports that he does not use drugs.  Allergies:  Allergies  Allergen Reactions  . Oxaliplatin Itching  . Fentanyl     itching    Medications: I have reviewed the patient's current medications.  Results for orders placed or performed during the hospital encounter of 04/07/17 (from the past 48 hour(s))  Comprehensive metabolic panel     Status: Abnormal   Collection Time: 04/07/17  3:20 PM  Result Value Ref Range   Sodium 132 (L)  135 - 145 mmol/L   Potassium 4.2 3.5 - 5.1 mmol/L   Chloride 95 (L) 101 - 111 mmol/L   CO2 26 22 - 32 mmol/L   Glucose, Bld 180 (H) 65 - 99 mg/dL   BUN 7 6 - 20 mg/dL   Creatinine, Ser 0.92 0.61 - 1.24 mg/dL   Calcium 9.2 8.9 - 10.3 mg/dL   Total Protein 8.4 (H) 6.5 - 8.1 g/dL   Albumin 3.0 (L) 3.5 - 5.0 g/dL   AST 23 15 - 41 U/L   ALT 47 17 - 63 U/L   Alkaline Phosphatase 308 (H) 38 - 126 U/L   Total Bilirubin 0.4 0.3 - 1.2 mg/dL   GFR calc non Af Amer >60 >60 mL/min   GFR calc Af Amer >60 >60 mL/min    Comment: (NOTE) The eGFR has been calculated using the CKD EPI equation. This calculation has not been validated in all clinical situations. eGFR's persistently <60 mL/min signify possible Chronic Kidney Disease.    Anion gap 11 5 - 15  CBC with Differential     Status: Abnormal   Collection Time: 04/07/17  3:20 PM  Result Value Ref Range   WBC 17.2 (H) 4.0 - 10.5 K/uL   RBC 4.86 4.22 - 5.81 MIL/uL   Hemoglobin 13.1 13.0 - 17.0 g/dL   HCT 41.8 39.0 - 52.0 %   MCV 86.0 78.0 - 100.0 fL   MCH 27.0 26.0 - 34.0 pg   MCHC 31.3 30.0 - 36.0 g/dL   RDW 17.2 (H) 11.5 - 15.5 %   Platelets 593 (H) 150 - 400 K/uL   Neutrophils Relative % 81 %   Neutro Abs 14.0 (H) 1.7 - 7.7 K/uL   Lymphocytes Relative 8 %   Lymphs Abs 1.4 0.7 - 4.0 K/uL   Monocytes Relative 9 %   Monocytes Absolute 1.5 (H) 0.1 - 1.0 K/uL   Eosinophils Relative 2 %   Eosinophils Absolute 0.3 0.0 - 0.7 K/uL   Basophils Relative 0 %   Basophils Absolute 0.0 0.0 - 0.1 K/uL  Protime-INR     Status: None   Collection Time: 04/07/17  3:20 PM  Result Value Ref Range   Prothrombin Time 14.6 11.4 - 15.2 seconds   INR 1.15   Culture, blood (Routine x 2)     Status: None (Preliminary result)   Collection Time: 04/07/17  3:20 PM  Result Value Ref Range   Specimen Description      LEFT ANTECUBITAL BOTTLES DRAWN AEROBIC AND ANAEROBIC   Special Requests      Blood Culture results may not be optimal due to an inadequate  volume of blood received in culture bottles   Culture NO GROWTH 2 DAYS    Report Status PENDING   Culture, blood (  Routine x 2)     Status: None (Preliminary result)   Collection Time: 04/07/17  3:20 PM  Result Value Ref Range   Specimen Description      RIGHT ANTECUBITAL BOTTLES DRAWN AEROBIC AND ANAEROBIC   Special Requests Blood Culture adequate volume    Culture NO GROWTH 2 DAYS    Report Status PENDING   Urinalysis, Routine w reflex microscopic     Status: Abnormal   Collection Time: 04/07/17  3:42 PM  Result Value Ref Range   Color, Urine YELLOW YELLOW   APPearance HAZY (A) CLEAR   Specific Gravity, Urine 1.008 1.005 - 1.030   pH 8.0 5.0 - 8.0   Glucose, UA NEGATIVE NEGATIVE mg/dL   Hgb urine dipstick SMALL (A) NEGATIVE   Bilirubin Urine NEGATIVE NEGATIVE   Ketones, ur NEGATIVE NEGATIVE mg/dL   Protein, ur 100 (A) NEGATIVE mg/dL   Nitrite NEGATIVE NEGATIVE   Leukocytes, UA LARGE (A) NEGATIVE   RBC / HPF 0-5 0 - 5 RBC/hpf   WBC, UA TOO NUMEROUS TO COUNT 0 - 5 WBC/hpf   Bacteria, UA MANY (A) NONE SEEN   Squamous Epithelial / LPF NONE SEEN NONE SEEN   WBC Clumps PRESENT    Mucus PRESENT   I-Stat CG4 Lactic Acid, ED     Status: Abnormal   Collection Time: 04/07/17  3:56 PM  Result Value Ref Range   Lactic Acid, Venous 2.11 (HH) 0.5 - 1.9 mmol/L  I-Stat CG4 Lactic Acid, ED     Status: None   Collection Time: 04/07/17  7:22 PM  Result Value Ref Range   Lactic Acid, Venous 1.02 0.5 - 1.9 mmol/L  TSH     Status: None   Collection Time: 04/07/17 11:30 PM  Result Value Ref Range   TSH 1.089 0.350 - 4.500 uIU/mL    Comment: Performed by a 3rd Generation assay with a functional sensitivity of <=0.01 uIU/mL.  Troponin I     Status: None   Collection Time: 04/07/17 11:30 PM  Result Value Ref Range   Troponin I <0.03 <0.03 ng/mL  Aerobic Culture (superficial specimen)     Status: None (Preliminary result)   Collection Time: 04/09/17  9:51 AM  Result Value Ref Range    Specimen Description ABSCESS    Special Requests NONE    Gram Stain      ABUNDANT WBC PRESENT, PREDOMINANTLY PMN MODERATE GRAM NEGATIVE RODS FEW GRAM POSITIVE RODS FEW GRAM POSITIVE COCCI IN PAIRS    Culture PENDING    Report Status PENDING   CBC with Differential/Platelet     Status: Abnormal   Collection Time: 04/09/17 10:09 AM  Result Value Ref Range   WBC 12.2 (H) 4.0 - 10.5 K/uL   RBC 4.25 4.22 - 5.81 MIL/uL   Hemoglobin 11.5 (L) 13.0 - 17.0 g/dL   HCT 35.8 (L) 39.0 - 52.0 %   MCV 84.2 78.0 - 100.0 fL   MCH 27.1 26.0 - 34.0 pg   MCHC 32.1 30.0 - 36.0 g/dL   RDW 17.3 (H) 11.5 - 15.5 %   Platelets 514 (H) 150 - 400 K/uL   Neutrophils Relative % 76 %   Neutro Abs 9.3 (H) 1.7 - 7.7 K/uL   Lymphocytes Relative 11 %   Lymphs Abs 1.3 0.7 - 4.0 K/uL   Monocytes Relative 10 %   Monocytes Absolute 1.2 (H) 0.1 - 1.0 K/uL   Eosinophils Relative 3 %   Eosinophils Absolute 0.3 0.0 - 0.7 K/uL  Basophils Relative 0 %   Basophils Absolute 0.0 0.0 - 0.1 K/uL  Basic metabolic panel     Status: Abnormal   Collection Time: 04/09/17 10:09 AM  Result Value Ref Range   Sodium 129 (L) 135 - 145 mmol/L   Potassium 4.2 3.5 - 5.1 mmol/L   Chloride 97 (L) 101 - 111 mmol/L   CO2 22 22 - 32 mmol/L   Glucose, Bld 255 (H) 65 - 99 mg/dL   BUN 10 6 - 20 mg/dL   Creatinine, Ser 1.17 0.61 - 1.24 mg/dL   Calcium 8.1 (L) 8.9 - 10.3 mg/dL   GFR calc non Af Amer >60 >60 mL/min   GFR calc Af Amer >60 >60 mL/min    Comment: (NOTE) The eGFR has been calculated using the CKD EPI equation. This calculation has not been validated in all clinical situations. eGFR's persistently <60 mL/min signify possible Chronic Kidney Disease.    Anion gap 10 5 - 15    Dg Chest 2 View  Result Date: 04/07/2017 CLINICAL DATA:  Back pain.  Fever, cough, and chills. EXAM: CHEST  2 VIEW COMPARISON:  03/04/2016. FINDINGS: PowerPort catheter noted looped in the right IJ with its tip tip over the upper superior vena cava.  This is in a more superior location than on prior study of 03/04/2016. Heart size normal. Multifocal bilateral pulmonary infiltrates versus mass lesions. Bilateral pneumonia and/or metastatic disease could present in this fashion. These findings are new from prior exam. No pleural effusion or pneumothorax. No acute bony abnormality . IMPRESSION: 1. PowerPort catheter noted looped in the right IJ with its tip over the upper superior vena cava. 2. Multifocal bilateral pulmonary infiltrates and/or mass lesions. Findings consistent with multifocal bilateral pneumonia and/or metastatic disease. These findings are new from prior exam . Electronically Signed   By: Pen[place]on   On: 04/07/2017 17:11   Ct Abdomen Pelvis W Contrast  Result Date: 04/07/2017 CLINICAL DATA:  History of renal carcinoma in 2012. 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 History of right partial nephrectomy. Suprapubic catheter. EXAM: CT ABDOMEN AND PELVIS WITH CONTRAST TECHNIQUE: Multidetector CT imaging of the abdomen and pelvis was performed using the standard protocol following bolus administration of intravenous contrast. CONTRAST:  157m ISOVUE-300 IOPAMIDOL (ISOVUE-300) INJECTION 61% COMPARISON:  January 15, 2017 FINDINGS: Lower chest: Interval progression of pulmonary metastases. A representative metastasis in the right lower lobe on series 5, image 2 measures 3.1 cm today versus 2.9 cm previously. The previously measured metastasis in the medial right lower lobe on series 5, image 6 is now confluent with adjacent nodules making measurement difficult. However, it measures 4.2 today versus 3.6 cm previously. A lingular metastasis on image 7 measures 2.6 cm today versus 2.7 cm previously. There also appear to be a few new small metastases. No other changes in the soft tissues of the chest. Hepatobiliary: No focal liver abnormality is seen. No gallstones, gallbladder wall thickening, or biliary  dilatation. Pancreas: Calcifications in the pancreas are consistent with previous pancreatitis. No peripancreatic stranding today to suggest acute pancreatitis. No pancreatic mass noted. Spleen: Normal in size without focal abnormality. Adrenals/Urinary Tract: The adrenal glands are normal. The patient is status post partial nephrectomy on the right at the lower pole. This site is unchanged with no evidence recurrence. No renal masses are seen. There is right-sided hydronephrosis which is moderate to severe. The right ureter is dilated over its proximal half. There is a discrete change in  caliber in the pelvis as seen on series 2, image 65 and is not dilated beyond this point. No underlying cause identified. There is persistent moderate to severe hydronephrosis on the left and the left ureter is also dilated along the proximal 2/3 of its length. There is also an abrupt caliber change in the pelvis. The distal most aspect of the left ureter is not dilated. No ureteral stones identified. The bladder is decompressed with a suprapubic catheter which is stable. The perinephric stranding on the left has largely resolved. Delayed images demonstrate contrast pooling in the dilated renal calices. Stomach/Bowel: The stomach is normal in appearance. The small bowel is normal. The patient has a left lower quadrant ostomy with a peristomal hernia containing small bowel loops. These loops of small bowel within the peristomal hernia are borderline measuring up to 3 cm but there is no associated wall thickening. The portion of colon within the ostomy remains compressed into the right side of the ostomy sac by adjacent small bowel loops but this is stable. The remaining colon is normal. There is no convincing evidence of appendicitis. There is a collection with adjacent stranding in the pelvis which will be described below. The stranding abuts the distal appendix. However, the adjacent appendix is nondilated. Vascular/Lymphatic:  Mild atherosclerotic changes in the nonaneurysmal aorta. The iliac vessels are also involved. The lymph nodes are normal. Reproductive: The patient is status post prostatectomy. The irregular fluid collection in the prostatectomy bed remains, demonstrating a thick enhancing wall with prominent surrounding fat stranding, again intimately involved with the pelvic floor musculature and posterior bladder wall. This collection is larger in the interval and appears multiloculated. The collection measures 8.6 by 6.8 cm on series 2, image 79. At this same level of the previous study, the collection measured 5.6 x 4.0 cm. There is also increased inferior extension with loculated components of fluid to both sides of the anal canal on series 2, image 84 which are much larger. The superior extension may be slightly increased in the interval. There appears to be involvement of the adjacent pelvic musculature which is worsened. The periosteal reaction along the anterior margin of the sacrum is probably mildly worsened in the interval. Other: No free air.  No peritoneal or omental disease identified. Musculoskeletal: Periosteal reaction along the anterior sacral margin as described above. No other bony changes. IMPRESSION: 1. There is now moderate to severe bilateral hydronephrosis, stable on the left and new on the right. There is an abrupt transition in both dilated ureters in the pelvis. No definitive underlying cause identified. No stone. The findings are consistent bilateral obstruction. The perinephric stranding on the left has almost completely resolved. 2. The chronic irregular thick walled fluid collection in the proctectomy bad has significantly enlarged and worsened since the comparison CT scan. The overall size has increased as described above. Extension into the adjacent pelvic musculature has also worsened. The periosteal reaction along the anterior margin of the adjacent sacrum is mildly worsened suggesting  osteomyelitis. This could represent an abscess and/or recurrent tumor. 3. Interval progression of pulmonary metastases. 4. 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 Loops of small bowel within the peristomal hernia are mildly prominent measuring up to 3 cm on coronal imaging. Recommend clinical correlation. 5. Atherosclerotic change in the aorta. Electronically Signed   By: Dorise Bullion III M.D   On: 04/07/2017 20:30   Ir Nephrostomy Placement Left  Result Date: 04/08/2017 INDICATION: 51 year old male with a complex history including  past rectal and renal cell carcinoma. He has recurrent bilateral hydronephrosis as well as an enlarging numb malignant versus infectious process in his pelvis. He has back pain, leukocytosis and evidence of infection. Bilateral percutaneous nephrostomy tube placement is warranted for renal decompression and to prevent urosepsis. EXAM: IR NEPHROSTOMY PLACEMENT RIGHT; IR NEPHROSTOMY PLACEMENT LEFT COMPARISON:  None. MEDICATIONS: Patient currently receiving intravenous antibiotics as an inpatient. No additional antibiotics were administered. ANESTHESIA/SEDATION: Dilaudid 1 mg IV ; Versed 4 mg IV Moderate Sedation Time:  22 minutes The patient was continuously monitored during the procedure by the interventional radiology nurse under my direct supervision. CONTRAST:  20 mL Isovue 370 - administered into the collecting system(s) FLUOROSCOPY TIME:  Fluoroscopy Time: 3 minutes 30 seconds (17 mGy). COMPLICATIONS: None immediate. TECHNIQUE: The procedure, risks, benefits, and alternatives were explained to the patient. Questions regarding the procedure were encouraged and answered. The patient understands and consents to the procedure. The left flank was prepped with chlorhexidine in a sterile fashion, and a sterile drape was applied covering the operative field. A sterile gown and sterile gloves were used for the procedure. Local anesthesia was provided with 1%  Lidocaine. The left flank was interrogated with ultrasound and the left kidney identified. The kidney is hydronephrotic. A suitable access site on the skin overlying the lower pole, posterior calix was identified. After local mg anesthesia was achieved, a small skin nick was made with an 11 blade scalpel. A 21 gauge Accustick needle was then advanced under direct sonographic guidance into the lower pole of the left kidney. A 0.018 inch wire was advanced under fluoroscopic guidance into the left renal collecting system. The Accustick sheath was then advanced over the wire and a 0.018 system exchanged for a 0.035 system. Gentle hand injection of contrast material confirms placement of the sheath within the renal collecting system. There is marked hydronephrosis. The tract from the scan into the renal collecting system was then dilated serially to 10-French. A 10-French Cook all-purpose drain was then placed and positioned under fluoroscopic guidance. The locking loop is well formed within the left renal pelvis. The catheter was secured to the skin with 2-0 Prolene and a sterile bandage was placed. Catheter was left to gravity bag drainage. The right flank was prepped with chlorhexidine in a sterile fashion, and a sterile drape was applied covering the operative field. A sterile gown and sterile gloves were used for the procedure. Local anesthesia was provided with 1% Lidocaine. The right flank was interrogated with ultrasound and the left kidney identified. The kidney is hydronephrotic. A suitable access site on the skin overlying the lower pole, posterior calix was identified. After local mg anesthesia was achieved, a small skin nick was made with an 11 blade scalpel. A 21 gauge Accustick needle was then advanced under direct sonographic guidance into the lower pole of the right kidney. A 0.018 inch wire was advanced under fluoroscopic guidance into the left renal collecting system. The Accustick sheath was then  advanced over the wire and a 0.018 system exchanged for a 0.035 system. Gentle hand injection of contrast material confirms placement of the sheath within the renal collecting system. There is marked hydronephrosis. The tract from the scan into the renal collecting system was then dilated serially to 10-French. A 10-French Cook all-purpose drain was then placed and positioned under fluoroscopic guidance. The locking loop is well formed within the left renal pelvis. The catheter was secured to the skin with 2-0 Prolene and a sterile bandage was  placed. Catheter was left to gravity bag drainage. IMPRESSION: Successful placement of a bilateral 10 French percutaneous nephrostomy tubes. PLAN: Return Interventional Radiology in 8 weeks for nephrostomy tube check and change. If the patient's clinical symptoms do not improve, then the source of his infection is likely the multiloculated process in the presacral space extending into the perineum. This may represent a large complex abscess, or centrally necrotic recurrent tumor, or both (centrally necrotic tumor with superinfection of a necrotic components). CT-guided aspiration and biopsy may be required to fully differentiate the process. Signed, Criselda Peaches, MD [facility] Electronically Signed   By: Jacqulynn Cadet M.D.   On: 04/08/2017 15:21   Ir Nephrostomy Placement Right  Result Date: 04/08/2017 INDICATION: 51 year old male with a complex history including past rectal and renal cell carcinoma. He has recurrent bilateral hydronephrosis as well as an enlarging numb malignant versus infectious process in his pelvis. He has back pain, leukocytosis and evidence of infection. Bilateral percutaneous nephrostomy tube placement is warranted for renal decompression and to prevent urosepsis. EXAM: IR NEPHROSTOMY PLACEMENT RIGHT; IR NEPHROSTOMY PLACEMENT LEFT COMPARISON:  None. MEDICATIONS: Patient currently  receiving intravenous antibiotics as an inpatient. No additional antibiotics were administered. ANESTHESIA/SEDATION: Dilaudid 1 mg IV ; Versed 4 mg IV Moderate Sedation Time:  22 minutes The patient was continuously monitored during the procedure by the interventional radiology nurse under my direct supervision. CONTRAST:  20 mL Isovue 370 - administered into the collecting system(s) FLUOROSCOPY TIME:  Fluoroscopy Time: 3 minutes 30 seconds (17 mGy). COMPLICATIONS: None immediate. TECHNIQUE: The procedure, risks, benefits, and alternatives were explained to the patient. Questions regarding the procedure were encouraged and answered. The patient understands and consents to the procedure. The left flank was prepped with chlorhexidine in a sterile fashion, and a sterile drape was applied covering the operative field. A sterile gown and sterile gloves were used for the procedure. Local anesthesia was provided with 1% Lidocaine. The left flank was interrogated with ultrasound and the left kidney identified. The kidney is hydronephrotic. A suitable access site on the skin overlying the lower pole, posterior calix was identified. After local mg anesthesia was achieved, a small skin nick was made with an 11 blade scalpel. A 21 gauge Accustick needle was then advanced under direct sonographic guidance into the lower pole of the left kidney. A 0.018 inch wire was advanced under fluoroscopic guidance into the left renal collecting system. The Accustick sheath was then advanced over the wire and a 0.018 system exchanged for a 0.035 system. Gentle hand injection of contrast material confirms placement of the sheath within the renal collecting system. There is marked hydronephrosis. The tract from the scan into the renal collecting system was then dilated serially to 10-French. A 10-French Cook all-purpose drain was then placed and positioned under fluoroscopic guidance. The locking loop is well formed within the left renal  pelvis. The catheter was secured to the skin with 2-0 Prolene and a sterile bandage was placed. Catheter was left to gravity bag drainage. The right flank was prepped with chlorhexidine in a sterile fashion, and a sterile drape was applied covering the operative field. A sterile gown and sterile gloves were used for the procedure. Local anesthesia was provided with 1% Lidocaine. The right flank was interrogated with ultrasound and the left kidney identified. The kidney is hydronephrotic. A suitable access site on the skin overlying the lower pole, posterior calix was identified. After local mg anesthesia was achieved, a  small skin nick was made with an 11 blade scalpel. A 21 gauge Accustick needle was then advanced under direct sonographic guidance into the lower pole of the right kidney. A 0.018 inch wire was advanced under fluoroscopic guidance into the left renal collecting system. The Accustick sheath was then advanced over the wire and a 0.018 system exchanged for a 0.035 system. Gentle hand injection of contrast material confirms placement of the sheath within the renal collecting system. There is marked hydronephrosis. The tract from the scan into the renal collecting system was then dilated serially to 10-French. A 10-French Cook all-purpose drain was then placed and positioned under fluoroscopic guidance. The locking loop is well formed within the left renal pelvis. The catheter was secured to the skin with 2-0 Prolene and a sterile bandage was placed. Catheter was left to gravity bag drainage. IMPRESSION: Successful placement of a bilateral 10 French percutaneous nephrostomy tubes. PLAN: Return Interventional Radiology in 8 weeks for nephrostomy tube check and change. If the patient's clinical symptoms do not improve, then the source of his infection is likely the multiloculated process in the presacral space extending into the perineum. This may represent a large complex abscess, or centrally necrotic  recurrent tumor, or both (centrally necrotic tumor with superinfection of a necrotic components). CT-guided aspiration and biopsy may be required to fully differentiate the process. Signed, Criselda Peaches, MD [facility] Radiology Electronically Signed   By: Jacqulynn Cadet M.D.   On: 04/08/2017 15:21    Review of Systems  Constitutional: Positive for chills. Negative for fever.  Genitourinary:       Back pain and perirectal pain.   Blood pressure 122/72, pulse 86, temperature 98.2 F (36.8 C), temperature source Oral, resp. rate 18, height 5' 5" (1.651 m), weight 86.3 kg (190 lb 4.1 oz), SpO2 96 %. Physical Exam  Genitourinary: Rectal exam shows tenderness.       Assessment/Plan: The patient has a perirectal area of induration and tenderness with a central bubble area.  For pain control this may need to be drained.  Because of the amount of tenderness, I believe if drainage is performed it should be done in the OR.    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  If that is the case, then this would probably not heal .  I will leave the final decision about OR to the ACS doctor starting tomorrow morning.  Judeth Horn 04/09/2017, 12:33 PM

## 2017-04-09 NOTE — Progress Notes (Signed)
Patient s/p bilateral PCN placement yesterday.  Called over to unit.  RN states patient sore, but otherwise stable. Reports he has yellow urine output from bilateral tubes.  No labs available for review today.   Brynda Greathouse, MS RD PA-C 9:39 AM

## 2017-04-09 NOTE — Progress Notes (Signed)
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 No SOB, CP or abd pain. C/o of back pain which is chronic.  Objective: Vitals:   04/08/17 2101 04/09/17 0520  BP: (!) 108/93 (!) 113/59  Pulse: 98 90  Resp: 18   Temp: 98 F (36.7 C) 98.4 F (36.9 C)  SpO2: 96% 92%    Intake/Output Summary (Last 24 hours) at 04/09/2017 1122 Last data filed at 04/09/2017 0924 Gross per 24 hour  Intake 1845 ml  Output 3425 ml  Net -1580 ml   Filed Weights   04/07/17 1509 04/08/17 0407 04/09/17 0520  Weight: 90.7 kg (200 lb) 87 kg (191 lb 12.8 oz) 86.3 kg (190 lb 4.1 oz)    Exam:   General:  NAD, NCAT  Cardiovascular: RRR, no MRG  Respiratory: CTAB, no WRR  Abdomen: ND, BS+  Musculoskeletal: moving all extr  Skin: draining abscess on L back   Colostomy with nl output  Suprapubic cath with nl output  Data Reviewed: Basic Metabolic Panel: Recent Labs  Lab 04/07/17 1520  NA 132*  K 4.2  CL 95*  CO2 26  GLUCOSE 180*  BUN 7  CREATININE 0.92  CALCIUM 9.2   Liver Function  Tests: Recent Labs  Lab 04/07/17 1520  AST 23  ALT 47  ALKPHOS 308*  BILITOT 0.4  PROT 8.4*  ALBUMIN 3.0*   No results for input(s): LIPASE, AMYLASE in the last 168 hours. No results for input(s): AMMONIA in the last 168 hours. CBC: Recent Labs  Lab 04/07/17 1520 04/09/17 1009  WBC 17.2* 12.2*  NEUTROABS 14.0* 9.3*  HGB 13.1 11.5*  HCT 41.8 35.8*  MCV 86.0 84.2  PLT 593* 514*   Cardiac Enzymes: Recent Labs  Lab 04/07/17 2330  TROPONINI <0.03   BNP (last 3 results) No results for input(s): BNP in the last 8760 hours.  ProBNP (last 3 results) No results for input(s): PROBNP in the last 8760 hours.  CBG: No results for input(s): GLUCAP in the last 168 hours.  Recent Results (from the past 240 hour(s))  Culture, blood (Routine x 2)     Status: None (Preliminary result)   Collection Time: 04/07/17  3:20 PM  Result Value Ref Range Status   Specimen Description   Final    LEFT ANTECUBITAL BOTTLES DRAWN AEROBIC AND ANAEROBIC   Special Requests   Final    Blood Culture results may not be optimal due to an inadequate volume of blood received in culture bottles   Culture NO GROWTH 2 DAYS  Final   Report Status PENDING  Incomplete  Culture, blood (Routine x 2)     Status: None (Preliminary result)   Collection Time: 04/07/17  3:20  PM  Result Value Ref Range Status   Specimen Description   Final    RIGHT ANTECUBITAL BOTTLES DRAWN AEROBIC AND ANAEROBIC   Special Requests Blood Culture adequate volume  Final   Culture NO GROWTH 2 DAYS  Final   Report Status PENDING  Incomplete     Studies: Dg Chest 2 View  Result Date: 04/07/2017 CLINICAL DATA:  Back pain.  Fever, cough, and chills. EXAM: CHEST  2 VIEW COMPARISON:  03/04/2016. FINDINGS: PowerPort catheter noted looped in the right IJ with its tip tip over the upper superior vena cava. This is in a more superior location than on prior study of 03/04/2016. Heart size normal. Multifocal bilateral pulmonary infiltrates  versus mass lesions. Bilateral pneumonia and/or metastatic disease could present in this fashion. These findings are new from prior exam. No pleural effusion or pneumothorax. No acute bony abnormality . IMPRESSION: 1. PowerPort catheter noted looped in the right IJ with its tip over the upper superior vena cava. 2. Multifocal bilateral pulmonary infiltrates and/or mass lesions. Findings consistent with multifocal bilateral pneumonia and/or metastatic disease. These findings are new from prior exam . Electronically Signed   By: [place]   On: 04/07/2017 17:11   Ct Abdomen Pelvis W Contrast  Result Date: 04/07/2017 CLINICAL DATA:  History of renal carcinoma in 2012. 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 History of right partial nephrectomy. Suprapubic catheter. EXAM: CT ABDOMEN AND PELVIS WITH CONTRAST TECHNIQUE: Multidetector CT imaging of the abdomen and pelvis was performed using the standard protocol following bolus administration of intravenous contrast. CONTRAST:  158mL ISOVUE-300 IOPAMIDOL (ISOVUE-300) INJECTION 61% COMPARISON:  January 15, 2017 FINDINGS: Lower chest: Interval progression of pulmonary metastases. A representative metastasis in the right lower lobe on series 5, image 2 measures 3.1 cm today versus 2.9 cm previously. The previously measured metastasis in the medial right lower lobe on series 5, image 6 is now confluent with adjacent nodules making measurement difficult. However, it measures 4.2 today versus 3.6 cm previously. A lingular metastasis on image 7 measures 2.6 cm today versus 2.7 cm previously. There also appear to be a few new small metastases. No other changes in the soft tissues of the chest. Hepatobiliary: No focal liver abnormality is seen. No gallstones, gallbladder wall thickening, or biliary dilatation. Pancreas: Calcifications in the pancreas are consistent with previous pancreatitis. No peripancreatic stranding today to suggest  acute pancreatitis. No pancreatic mass noted. Spleen: Normal in size without focal abnormality. Adrenals/Urinary Tract: The adrenal glands are normal. The patient is status post partial nephrectomy on the right at the lower pole. This site is unchanged with no evidence recurrence. No renal masses are seen. There is right-sided hydronephrosis which is moderate to severe. The right ureter is dilated over its proximal half. There is a discrete change in caliber in the pelvis as seen on series 2, image 65 and is not dilated beyond this point. No underlying cause identified. There is persistent moderate to severe hydronephrosis on the left and the left ureter is also dilated along the proximal 2/3 of its length. There is also an abrupt caliber change in the pelvis. The distal most aspect of the left ureter is not dilated. No ureteral stones identified. The bladder is decompressed with a suprapubic catheter which is stable. The perinephric stranding on the left has largely resolved. Delayed images demonstrate contrast pooling in the dilated renal calices. Stomach/Bowel: The stomach is normal in appearance. The small bowel is normal. The patient  has a left lower quadrant ostomy with a peristomal hernia containing small bowel loops. These loops of small bowel within the peristomal hernia are borderline measuring up to 3 cm but there is no associated wall thickening. The portion of colon within the ostomy remains compressed into the right side of the ostomy sac by adjacent small bowel loops but this is stable. The remaining colon is normal. There is no convincing evidence of appendicitis. There is a collection with adjacent stranding in the pelvis which will be described below. The stranding abuts the distal appendix. However, the adjacent appendix is nondilated. Vascular/Lymphatic: Mild atherosclerotic changes in the nonaneurysmal aorta. The iliac vessels are also involved. The lymph nodes are normal. Reproductive: The  patient is status post prostatectomy. The irregular fluid collection in the prostatectomy bed remains, demonstrating a thick enhancing wall with prominent surrounding fat stranding, again intimately involved with the pelvic floor musculature and posterior bladder wall. This collection is larger in the interval and appears multiloculated. The collection measures 8.6 by 6.8 cm on series 2, image 79. At this same level of the previous study, the collection measured 5.6 x 4.0 cm. There is also increased inferior extension with loculated components of fluid to both sides of the anal canal on series 2, image 84 which are much larger. The superior extension may be slightly increased in the interval. There appears to be involvement of the adjacent pelvic musculature which is worsened. The periosteal reaction along the anterior margin of the sacrum is probably mildly worsened in the interval. Other: No free air.  No peritoneal or omental disease identified. Musculoskeletal: Periosteal reaction along the anterior sacral margin as described above. No other bony changes. IMPRESSION: 1. There is now moderate to severe bilateral hydronephrosis, stable on the left and new on the right. There is an abrupt transition in both dilated ureters in the pelvis. No definitive underlying cause identified. No stone. The findings are consistent bilateral obstruction. The perinephric stranding on the left has almost completely resolved. 2. The chronic irregular thick walled fluid collection in the proctectomy bad has significantly enlarged and worsened since the comparison CT scan. The overall size has increased as described above. Extension into the adjacent pelvic musculature has also worsened. The periosteal reaction along the anterior margin of the adjacent sacrum is mildly worsened suggesting osteomyelitis. This could represent an abscess and/or recurrent tumor. 3. Interval progression of pulmonary metastases. 4. 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 Loops of small bowel within the peristomal hernia are mildly prominent measuring up to 3 cm on coronal imaging. Recommend clinical correlation. 5. Atherosclerotic change in the aorta. Electronically Signed   By: Dorise Bullion III M.D   On: 04/07/2017 20:30   Ir Nephrostomy Placement Left  Result Date: 04/08/2017 INDICATION: 51 year old male with a complex history including past rectal and renal cell carcinoma. He has recurrent bilateral hydronephrosis as well as an enlarging numb malignant versus infectious process in his pelvis. He has back pain, leukocytosis and evidence of infection. Bilateral percutaneous nephrostomy tube placement is warranted for renal decompression and to prevent urosepsis. EXAM: IR NEPHROSTOMY PLACEMENT RIGHT; IR NEPHROSTOMY PLACEMENT LEFT COMPARISON:  None. MEDICATIONS: Patient currently receiving intravenous antibiotics as an inpatient. No additional antibiotics were administered. ANESTHESIA/SEDATION: Dilaudid 1 mg IV ; Versed 4 mg IV Moderate Sedation Time:  22 minutes The patient was continuously monitored during the procedure by the interventional radiology nurse under my direct supervision. CONTRAST:  20 mL Isovue 370 - administered into  the collecting system(s) FLUOROSCOPY TIME:  Fluoroscopy Time: 3 minutes 30 seconds (17 mGy). COMPLICATIONS: None immediate. TECHNIQUE: The procedure, risks, benefits, and alternatives were explained to the patient. Questions regarding the procedure were encouraged and answered. The patient understands and consents to the procedure. The left flank was prepped with chlorhexidine in a sterile fashion, and a sterile drape was applied covering the operative field. A sterile gown and sterile gloves were used for the procedure. Local anesthesia was provided with 1% Lidocaine. The left flank was interrogated with ultrasound and the left kidney identified. The kidney is hydronephrotic. A suitable access site on the skin  overlying the lower pole, posterior calix was identified. After local mg anesthesia was achieved, a small skin nick was made with an 11 blade scalpel. A 21 gauge Accustick needle was then advanced under direct sonographic guidance into the lower pole of the left kidney. A 0.018 inch wire was advanced under fluoroscopic guidance into the left renal collecting system. The Accustick sheath was then advanced over the wire and a 0.018 system exchanged for a 0.035 system. Gentle hand injection of contrast material confirms placement of the sheath within the renal collecting system. There is marked hydronephrosis. The tract from the scan into the renal collecting system was then dilated serially to 10-French. A 10-French Cook all-purpose drain was then placed and positioned under fluoroscopic guidance. The locking loop is well formed within the left renal pelvis. The catheter was secured to the skin with 2-0 Prolene and a sterile bandage was placed. Catheter was left to gravity bag drainage. The right flank was prepped with chlorhexidine in a sterile fashion, and a sterile drape was applied covering the operative field. A sterile gown and sterile gloves were used for the procedure. Local anesthesia was provided with 1% Lidocaine. The right flank was interrogated with ultrasound and the left kidney identified. The kidney is hydronephrotic. A suitable access site on the skin overlying the lower pole, posterior calix was identified. After local mg anesthesia was achieved, a small skin nick was made with an 11 blade scalpel. A 21 gauge Accustick needle was then advanced under direct sonographic guidance into the lower pole of the right kidney. A 0.018 inch wire was advanced under fluoroscopic guidance into the left renal collecting system. The Accustick sheath was then advanced over the wire and a 0.018 system exchanged for a 0.035 system. Gentle hand injection of contrast material confirms placement of the sheath within the  renal collecting system. There is marked hydronephrosis. The tract from the scan into the renal collecting system was then dilated serially to 10-French. A 10-French Cook all-purpose drain was then placed and positioned under fluoroscopic guidance. The locking loop is well formed within the left renal pelvis. The catheter was secured to the skin with 2-0 Prolene and a sterile bandage was placed. Catheter was left to gravity bag drainage. IMPRESSION: Successful placement of a bilateral 10 French percutaneous nephrostomy tubes. PLAN: Return Interventional Radiology in 8 weeks for nephrostomy tube check and change. If the patient's clinical symptoms do not improve, then the source of his infection is likely the multiloculated process in the presacral space extending into the perineum. This may represent a large complex abscess, or centrally necrotic recurrent tumor, or both (centrally necrotic tumor with superinfection of a necrotic components). CT-guided aspiration and biopsy may be required to fully differentiate the process. Signed, Criselda Peaches, MD [facility] Radiology Electronically Signed   By: Dellis Filbert.D.  On: 04/08/2017 15:21   Ir Nephrostomy Placement Right  Result Date: 04/08/2017 INDICATION: 51 year old male with a complex history including past rectal and renal cell carcinoma. He has recurrent bilateral hydronephrosis as well as an enlarging numb malignant versus infectious process in his pelvis. He has back pain, leukocytosis and evidence of infection. Bilateral percutaneous nephrostomy tube placement is warranted for renal decompression and to prevent urosepsis. EXAM: IR NEPHROSTOMY PLACEMENT RIGHT; IR NEPHROSTOMY PLACEMENT LEFT COMPARISON:  None. MEDICATIONS: Patient currently receiving intravenous antibiotics as an inpatient. No additional antibiotics were administered. ANESTHESIA/SEDATION: Dilaudid 1 mg IV ; Versed 4 mg IV  Moderate Sedation Time:  22 minutes The patient was continuously monitored during the procedure by the interventional radiology nurse under my direct supervision. CONTRAST:  20 mL Isovue 370 - administered into the collecting system(s) FLUOROSCOPY TIME:  Fluoroscopy Time: 3 minutes 30 seconds (17 mGy). COMPLICATIONS: None immediate. TECHNIQUE: The procedure, risks, benefits, and alternatives were explained to the patient. Questions regarding the procedure were encouraged and answered. The patient understands and consents to the procedure. The left flank was prepped with chlorhexidine in a sterile fashion, and a sterile drape was applied covering the operative field. A sterile gown and sterile gloves were used for the procedure. Local anesthesia was provided with 1% Lidocaine. The left flank was interrogated with ultrasound and the left kidney identified. The kidney is hydronephrotic. A suitable access site on the skin overlying the lower pole, posterior calix was identified. After local mg anesthesia was achieved, a small skin nick was made with an 11 blade scalpel. A 21 gauge Accustick needle was then advanced under direct sonographic guidance into the lower pole of the left kidney. A 0.018 inch wire was advanced under fluoroscopic guidance into the left renal collecting system. The Accustick sheath was then advanced over the wire and a 0.018 system exchanged for a 0.035 system. Gentle hand injection of contrast material confirms placement of the sheath within the renal collecting system. There is marked hydronephrosis. The tract from the scan into the renal collecting system was then dilated serially to 10-French. A 10-French Cook all-purpose drain was then placed and positioned under fluoroscopic guidance. The locking loop is well formed within the left renal pelvis. The catheter was secured to the skin with 2-0 Prolene and a sterile bandage was placed. Catheter was left to gravity bag drainage. The right flank  was prepped with chlorhexidine in a sterile fashion, and a sterile drape was applied covering the operative field. A sterile gown and sterile gloves were used for the procedure. Local anesthesia was provided with 1% Lidocaine. The right flank was interrogated with ultrasound and the left kidney identified. The kidney is hydronephrotic. A suitable access site on the skin overlying the lower pole, posterior calix was identified. After local mg anesthesia was achieved, a small skin nick was made with an 11 blade scalpel. A 21 gauge Accustick needle was then advanced under direct sonographic guidance into the lower pole of the right kidney. A 0.018 inch wire was advanced under fluoroscopic guidance into the left renal collecting system. The Accustick sheath was then advanced over the wire and a 0.018 system exchanged for a 0.035 system. Gentle hand injection of contrast material confirms placement of the sheath within the renal collecting system. There is marked hydronephrosis. The tract from the scan into the renal collecting system was then dilated serially to 10-French. A 10-French Cook all-purpose drain was then placed and positioned under fluoroscopic guidance. The locking loop is well  formed within the left renal pelvis. The catheter was secured to the skin with 2-0 Prolene and a sterile bandage was placed. Catheter was left to gravity bag drainage. IMPRESSION: Successful placement of a bilateral 10 French percutaneous nephrostomy tubes. PLAN: Return Interventional Radiology in 8 weeks for nephrostomy tube check and change. If the patient's clinical symptoms do not improve, then the source of his infection is likely the multiloculated process in the presacral space extending into the perineum. This may represent a large complex abscess, or centrally necrotic recurrent tumor, or both (centrally necrotic tumor with superinfection of a necrotic components). CT-guided aspiration and biopsy may be required to fully  differentiate the process. Signed, Criselda Peaches, MD [facility] Radiology Electronically Signed   By: Jacqulynn Cadet M.D.   On: 04/08/2017 15:21    Scheduled Meds: . amitriptyline  50 mg Oral QHS  . DULoxetine  60 mg Oral Daily  . lidocaine-EPINEPHrine  10 mL Infiltration Once  . pregabalin  150 mg Oral BID  . temazepam  15 mg Oral QHS   Continuous Infusions: . cefTRIAXone (ROCEPHIN)  IV Stopped (04/08/17 2300)    Principal Problem:   Leukocytosis Active Problems:   Hyponatremia   Hydronephrosis   Tachycardia    Time spent: [place] [facility] Pager AMION. If 7PM-7AM, please contact night-coverage at [email or web address], password [facility] 04/09/2017, 11:22 AM  LOS: 2 days

## 2017-04-09 NOTE — Consult Note (Signed)
  [facility]  Total days of antibiotics 2               Reason for Consult: peri-rectal abscess    Referring Physician: hobbs  Principal Problem:   Leukocytosis Active Problems:   Hyponatremia   Hydronephrosis   Tachycardia    HPI: Andrew Kline is a 51 y.o. male with history of  Renal cell ca in 2012, and metastatic rectal cancer s/p resection, proctocolectomy and colostomy in April 2017. Surgery complicated by urethral injury thus has suprapubic catheter in place now. He was admitted for intermittent fever and chills x 1 week. He also notices that his perirectal/buttocks wound has been draining more in the last few weeks.he reports that his urinary output has been steady and urine is clear in consistency. Found to have temp of 100.1, WBC of 17K, CT showed bilateral hydronephrosis plus The irregular fluid collection in the prostatectomy bed remains,demonstrating a thick enhancing wall with prominent surrounding fat stranding, again intimately involved with the pelvic floor musculature and posterior bladder wall. This collection is larger in the interval and appears multiloculated. The collection measures 8.6 by 6.8 cm. IR has placed bilateral perc nephrostomies to decompress kidneys. Patient has cultures pending. He was placed on ceftriaxone plus metronidazole. He has been seen by dr wyatt from general surgery who feels that the abn fluid collection could also represent tumor burden in addition to infection. Recommends surgical debridement, to be addressed by the team tomorrow.   Interestingly  He was hospitalized  in June for perirectal abscess where CT showed Significant interval increase in size of patient's lower pelvic abscess, now measuring 8.7 x 5.9 x 3.5 cm and tracking inferiorlyabout the left side of the anorectal canal. Extensive surrounding phlegmon and soft tissue inflammation noted. Soft tissue edema tracks along the medial left gluteus muscle, with a 6  cm long tract from the abscess to the medial left buttock surface. At that time, it was felt he did not need surgery since it was draining, but only given 10 d course of bactrim  Past Medical History:  Diagnosis Date  . Anxiety   . Arthritis   . Depression   . Hospice care   . Kidney stone 03/22/15   left  . 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 Rectal pain   . Renal cell cancer (HCC)    2012.   . Shortness of breath dyspnea   . Suprapubic catheter (HCC)   . Ventral hernia    around ostomy site    Allergies:  Allergies  Allergen Reactions  . Oxaliplatin Itching  . Fentanyl     itching    MEDICATIONS: . amitriptyline  50 mg Oral QHS  . DULoxetine  60 mg Oral Daily  . lidocaine-EPINEPHrine  10 mL Infiltration Once  . oxyCODONE  10 mg Oral Q12H  . pregabalin  150 mg Oral BID  . temazepam  15 mg Oral QHS    Social History   Tobacco Use  . Smoking status: Current Every Day Smoker    Packs/day: 0.50    Years: 30.00    Pack years: 15.00    Types: Cigarettes  . Smokeless tobacco: Never Used  Substance Use Topics  . Alcohol use: Yes    Alcohol/week: 0.0 oz    Comment: Occ  . Drug use: No    Family History  Problem Relation Age of Onset  . Healthy   Mother   . Lung cancer Maternal Grandfather   . Colon cancer Maternal Uncle        age 59  . Diabetes Other      Review of Systems  Constitutional: positive for fever, chills, diaphoresis, activity change, appetite change, fatigue and unexpected weight change.  HENT: Negative for congestion, sore throat, rhinorrhea, sneezing, trouble swallowing and sinus pressure.  Eyes: Negative for photophobia and visual disturbance.  Respiratory: Negative for cough, chest tightness, shortness of breath, wheezing and stridor.  Cardiovascular: Negative for chest pain, palpitations and leg swelling.  Gastrointestinal: Negative for nausea, vomiting, abdominal pain, diarrhea, constipation, blood in  stool, abdominal distention and anal bleeding.  Genitourinary: Negative for dysuria, hematuria, flank pain and difficulty urinating.  Musculoskeletal: Negative for myalgias, back pain, joint swelling, arthralgias and gait problem.  Skin: postivie for wound. for color change, pallor, rash and wound.  Neurological: Negative for dizziness, tremors, weakness and light-headedness.  Hematological: Negative for adenopathy. Does not bruise/bleed easily.  Psychiatric/Behavioral: Negative for behavioral problems, confusion, sleep disturbance, dysphoric mood, decreased concentration and agitation.     OBJECTIVE: Temp:  [98 F (36.7 C)-98.8 F (37.1 C)] 98.2 F (36.8 C) (12/09 1144) Pulse Rate:  [86-98] 86 (12/09 1144) Resp:  [18-23] 18 (12/09 1144) BP: (100-142)/(59-93) 122/72 (12/09 1144) SpO2:  [86 %-100 %] 96 % (12/09 1144) Weight:  [190 lb 4.1 oz (86.3 kg)] 190 lb 4.1 oz (86.3 kg) (12/09 0520) Physical Exam  Constitutional: He is oriented to person, place, and time. He appears chronically ill but well-nourished. No distress.  HENT:  Mouth/Throat: Oropharynx is clear and moist. No oropharyngeal exudate.  Cardiovascular: Normal rate, regular rhythm and normal heart sounds. Exam reveals no gallop and no friction rub.  No murmur heard.  Pulmonary/Chest: Effort normal and breath sounds normal. No respiratory distress. He has no wheezes.  Abdominal: Soft. Bowel sounds are normal. He exhibits no distension. There is no tenderness. Ostomy in left lower quadrant, suprapubic catheter in place Back = nephrostomy tubes Buttocks= open ulcer with yellowish/brownish drainage with surrounding erythema, tender to touch Lymphadenopathy:  He has no cervical adenopathy.  Neurological: He is alert and oriented to person, place, and time.  Skin: Skin is warm and dry. No rash noted. No erythema.  Psychiatric: He has a normal mood and affect. His behavior is normal.    LABS: Results for orders placed or  performed during the hospital encounter of 04/07/17 (from the past 48 hour(s))  Comprehensive metabolic panel     Status: Abnormal   Collection Time: 04/07/17  3:20 PM  Result Value Ref Range   Sodium 132 (L) 135 - 145 mmol/L   Potassium 4.2 3.5 - 5.1 mmol/L   Chloride 95 (L) 101 - 111 mmol/L   CO2 26 22 - 32 mmol/L   Glucose, Bld 180 (H) 65 - 99 mg/dL   BUN 7 6 - 20 mg/dL   Creatinine, Ser 0.92 0.61 - 1.24 mg/dL   Calcium 9.2 8.9 - 10.3 mg/dL   Total Protein 8.4 (H) 6.5 - 8.1 g/dL   Albumin 3.0 (L) 3.5 - 5.0 g/dL   AST 23 15 - 41 U/L   ALT 47 17 - 63 U/L   Alkaline Phosphatase 308 (H) 38 - 126 U/L   Total Bilirubin 0.4 0.3 - 1.2 mg/dL   GFR calc non Af Amer >60 >60 mL/min   GFR calc Af Amer >60 >60 mL/min    Comment: (NOTE) The eGFR has been calculated using the   CKD EPI equation. This calculation has not been validated in all clinical situations. eGFR's persistently <60 mL/min signify possible Chronic Kidney Disease.    Anion gap 11 5 - 15  CBC with Differential     Status: Abnormal   Collection Time: 04/07/17  3:20 PM  Result Value Ref Range   WBC 17.2 (H) 4.0 - 10.5 K/uL   RBC 4.86 4.22 - 5.81 MIL/uL   Hemoglobin 13.1 13.0 - 17.0 g/dL   HCT 41.8 39.0 - 52.0 %   MCV 86.0 78.0 - 100.0 fL   MCH 27.0 26.0 - 34.0 pg   MCHC 31.3 30.0 - 36.0 g/dL   RDW 17.2 (H) 11.5 - 15.5 %   Platelets 593 (H) 150 - 400 K/uL   Neutrophils Relative % 81 %   Neutro Abs 14.0 (H) 1.7 - 7.7 K/uL   Lymphocytes Relative 8 %   Lymphs Abs 1.4 0.7 - 4.0 K/uL   Monocytes Relative 9 %   Monocytes Absolute 1.5 (H) 0.1 - 1.0 K/uL   Eosinophils Relative 2 %   Eosinophils Absolute 0.3 0.0 - 0.7 K/uL   Basophils Relative 0 %   Basophils Absolute 0.0 0.0 - 0.1 K/uL  Protime-INR     Status: None   Collection Time: 04/07/17  3:20 PM  Result Value Ref Range   Prothrombin Time 14.6 11.4 - 15.2 seconds   INR 1.15   Culture, blood (Routine x 2)     Status: None (Preliminary result)   Collection Time:  04/07/17  3:20 PM  Result Value Ref Range   Specimen Description      LEFT ANTECUBITAL BOTTLES DRAWN AEROBIC AND ANAEROBIC   Special Requests      Blood Culture results may not be optimal due to an inadequate volume of blood received in culture bottles   Culture NO GROWTH 2 DAYS    Report Status PENDING   Culture, blood (Routine x 2)     Status: None (Preliminary result)   Collection Time: 04/07/17  3:20 PM  Result Value Ref Range   Specimen Description      RIGHT ANTECUBITAL BOTTLES DRAWN AEROBIC AND ANAEROBIC   Special Requests Blood Culture adequate volume    Culture NO GROWTH 2 DAYS    Report Status PENDING   Urinalysis, Routine w reflex microscopic     Status: Abnormal   Collection Time: 04/07/17  3:42 PM  Result Value Ref Range   Color, Urine YELLOW YELLOW   APPearance HAZY (A) CLEAR   Specific Gravity, Urine 1.008 1.005 - 1.030   pH 8.0 5.0 - 8.0   Glucose, UA NEGATIVE NEGATIVE mg/dL   Hgb urine dipstick SMALL (A) NEGATIVE   Bilirubin Urine NEGATIVE NEGATIVE   Ketones, ur NEGATIVE NEGATIVE mg/dL   Protein, ur 100 (A) NEGATIVE mg/dL   Nitrite NEGATIVE NEGATIVE   Leukocytes, UA LARGE (A) NEGATIVE   RBC / HPF 0-5 0 - 5 RBC/hpf   WBC, UA TOO NUMEROUS TO COUNT 0 - 5 WBC/hpf   Bacteria, UA MANY (A) NONE SEEN   Squamous Epithelial / LPF NONE SEEN NONE SEEN   WBC Clumps PRESENT    Mucus PRESENT   I-Stat CG4 Lactic Acid, ED     Status: Abnormal   Collection Time: 04/07/17  3:56 PM  Result Value Ref Range   Lactic Acid, Venous 2.11 (HH) 0.5 - 1.9 mmol/L  Urine culture     Status: None (Preliminary result)   Collection Time: 04/07/17  7:00 PM    Result Value Ref Range   Specimen Description URINE, SUPRAPUBIC    Special Requests NONE    Culture      CULTURE REINCUBATED FOR BETTER GROWTH Performed at [facility], [address] [place], [place] [address]    Report Status PENDING   I-Stat CG4 Lactic Acid, ED     Status: None   Collection Time: 04/07/17  7:22 PM    Result Value Ref Range   Lactic Acid, Venous 1.02 0.5 - 1.9 mmol/L  TSH     Status: None   Collection Time: 04/07/17 11:30 PM  Result Value Ref Range   TSH 1.089 0.350 - 4.500 uIU/mL    Comment: Performed by a 3rd Generation assay with a functional sensitivity of <=0.01 uIU/mL.  Troponin I     Status: None   Collection Time: 04/07/17 11:30 PM  Result Value Ref Range   Troponin I <0.03 <0.03 ng/mL  Aerobic Culture (superficial specimen)     Status: None (Preliminary result)   Collection Time: 04/09/17  9:51 AM  Result Value Ref Range   Specimen Description ABSCESS    Special Requests NONE    Gram Stain      ABUNDANT WBC PRESENT, PREDOMINANTLY PMN MODERATE GRAM NEGATIVE RODS FEW GRAM POSITIVE RODS FEW GRAM POSITIVE COCCI IN PAIRS    Culture PENDING    Report Status PENDING   CBC with Differential/Platelet     Status: Abnormal   Collection Time: 04/09/17 10:09 AM  Result Value Ref Range   WBC 12.2 (H) 4.0 - 10.5 K/uL   RBC 4.25 4.22 - 5.81 MIL/uL   Hemoglobin 11.5 (L) 13.0 - 17.0 g/dL   HCT 35.8 (L) 39.0 - 52.0 %   MCV 84.2 78.0 - 100.0 fL   MCH 27.1 26.0 - 34.0 pg   MCHC 32.1 30.0 - 36.0 g/dL   RDW 17.3 (H) 11.5 - 15.5 %   Platelets 514 (H) 150 - 400 K/uL   Neutrophils Relative % 76 %   Neutro Abs 9.3 (H) 1.7 - 7.7 K/uL   Lymphocytes Relative 11 %   Lymphs Abs 1.3 0.7 - 4.0 K/uL   Monocytes Relative 10 %   Monocytes Absolute 1.2 (H) 0.1 - 1.0 K/uL   Eosinophils Relative 3 %   Eosinophils Absolute 0.3 0.0 - 0.7 K/uL   Basophils Relative 0 %   Basophils Absolute 0.0 0.0 - 0.1 K/uL  Basic metabolic panel     Status: Abnormal   Collection Time: 04/09/17 10:09 AM  Result Value Ref Range   Sodium 129 (L) 135 - 145 mmol/L   Potassium 4.2 3.5 - 5.1 mmol/L   Chloride 97 (L) 101 - 111 mmol/L   CO2 22 22 - 32 mmol/L   Glucose, Bld 255 (H) 65 - 99 mg/dL   BUN 10 6 - 20 mg/dL   Creatinine, Ser 1.17 0.61 - 1.24 mg/dL   Calcium 8.1 (L) 8.9 - 10.3 mg/dL   GFR calc non Af  Amer >60 >60 mL/min   GFR calc Af Amer >60 >60 mL/min    Comment: (NOTE) The eGFR has been calculated using the CKD EPI equation. This calculation has not been validated in all clinical situations. eGFR's persistently <60 mL/min signify possible Chronic Kidney Disease.    Anion gap 10 5 - 15    MICRO: 12/7 blood cx pending 12/7 urine cx pending 12/9 perirectal wound cx polymicrobial on gram stain IMAGING: Dg Chest 2 View  Result Date: 04/07/2017 CLINICAL   DATA:  Back pain.  Fever, cough, and chills. EXAM: CHEST  2 VIEW COMPARISON:  03/04/2016. FINDINGS: PowerPort catheter noted looped in the right IJ with its tip tip over the upper superior vena cava. This is in a more superior location than on prior study of 03/04/2016. Heart size normal. Multifocal bilateral pulmonary infiltrates versus mass lesions. Bilateral pneumonia and/or metastatic disease could present in this fashion. These findings are new from prior exam. No pleural effusion or pneumothorax. No acute bony abnormality . IMPRESSION: 1. PowerPort catheter noted looped in the right IJ with its tip over the upper superior vena cava. 2. Multifocal bilateral pulmonary infiltrates and/or mass lesions. Findings consistent with multifocal bilateral pneumonia and/or metastatic disease. These findings are new from prior exam . Electronically Signed   By: Thomas  Register   On: 04/07/2017 17:11   Ct Abdomen Pelvis W Contrast  Result Date: 04/07/2017 CLINICAL DATA:  History of renal carcinoma in 2012. 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 History of right partial nephrectomy. Suprapubic catheter. EXAM: CT ABDOMEN AND PELVIS WITH CONTRAST TECHNIQUE: Multidetector CT imaging of the abdomen and pelvis was performed using the standard protocol following bolus administration of intravenous contrast. CONTRAST:  100mL ISOVUE-300 IOPAMIDOL (ISOVUE-300) INJECTION 61% COMPARISON:  January 15, 2017 FINDINGS: Lower chest:  Interval progression of pulmonary metastases. A representative metastasis in the right lower lobe on series 5, image 2 measures 3.1 cm today versus 2.9 cm previously. The previously measured metastasis in the medial right lower lobe on series 5, image 6 is now confluent with adjacent nodules making measurement difficult. However, it measures 4.2 today versus 3.6 cm previously. A lingular metastasis on image 7 measures 2.6 cm today versus 2.7 cm previously. There also appear to be a few new small metastases. No other changes in the soft tissues of the chest. Hepatobiliary: No focal liver abnormality is seen. No gallstones, gallbladder wall thickening, or biliary dilatation. Pancreas: Calcifications in the pancreas are consistent with previous pancreatitis. No peripancreatic stranding today to suggest acute pancreatitis. No pancreatic mass noted. Spleen: Normal in size without focal abnormality. Adrenals/Urinary Tract: The adrenal glands are normal. The patient is status post partial nephrectomy on the right at the lower pole. This site is unchanged with no evidence recurrence. No renal masses are seen. There is right-sided hydronephrosis which is moderate to severe. The right ureter is dilated over its proximal half. There is a discrete change in caliber in the pelvis as seen on series 2, image 65 and is not dilated beyond this point. No underlying cause identified. There is persistent moderate to severe hydronephrosis on the left and the left ureter is also dilated along the proximal 2/3 of its length. There is also an abrupt caliber change in the pelvis. The distal most aspect of the left ureter is not dilated. No ureteral stones identified. The bladder is decompressed with a suprapubic catheter which is stable. The perinephric stranding on the left has largely resolved. Delayed images demonstrate contrast pooling in the dilated renal calices. Stomach/Bowel: The stomach is normal in appearance. The small bowel is  normal. The patient has a left lower quadrant ostomy with a peristomal hernia containing small bowel loops. These loops of small bowel within the peristomal hernia are borderline measuring up to 3 cm but there is no associated wall thickening. The portion of colon within the ostomy remains compressed into the right side of the ostomy sac by adjacent small bowel loops but this is stable. The   remaining colon is normal. There is no convincing evidence of appendicitis. There is a collection with adjacent stranding in the pelvis which will be described below. The stranding abuts the distal appendix. However, the adjacent appendix is nondilated. Vascular/Lymphatic: Mild atherosclerotic changes in the nonaneurysmal aorta. The iliac vessels are also involved. The lymph nodes are normal. Reproductive: The patient is status post prostatectomy. The irregular fluid collection in the prostatectomy bed remains, demonstrating a thick enhancing wall with prominent surrounding fat stranding, again intimately involved with the pelvic floor musculature and posterior bladder wall. This collection is larger in the interval and appears multiloculated. The collection measures 8.6 by 6.8 cm on series 2, image 79. At this same level of the previous study, the collection measured 5.6 x 4.0 cm. There is also increased inferior extension with loculated components of fluid to both sides of the anal canal on series 2, image 84 which are much larger. The superior extension may be slightly increased in the interval. There appears to be involvement of the adjacent pelvic musculature which is worsened. The periosteal reaction along the anterior margin of the sacrum is probably mildly worsened in the interval. Other: No free air.  No peritoneal or omental disease identified. Musculoskeletal: Periosteal reaction along the anterior sacral margin as described above. No other bony changes. IMPRESSION: 1. There is now moderate to severe bilateral  hydronephrosis, stable on the left and new on the right. There is an abrupt transition in both dilated ureters in the pelvis. No definitive underlying cause identified. No stone. The findings are consistent bilateral obstruction. The perinephric stranding on the left has almost completely resolved. 2. The chronic irregular thick walled fluid collection in the proctectomy bad has significantly enlarged and worsened since the comparison CT scan. The overall size has increased as described above. Extension into the adjacent pelvic musculature has also worsened. The periosteal reaction along the anterior margin of the adjacent sacrum is mildly worsened suggesting osteomyelitis. This could represent an abscess and/or recurrent tumor. 3. Interval progression of pulmonary metastases. 4. 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 Loops of small bowel within the peristomal hernia are mildly prominent measuring up to 3 cm on coronal imaging. Recommend clinical correlation. 5. Atherosclerotic change in the aorta. Electronically Signed   By: Dorise Bullion III M.D   On: 04/07/2017 20:30   Ir Nephrostomy Placement Left  Result Date: 04/08/2017 INDICATION: 51 year old male with a complex history including past rectal and renal cell carcinoma. He has recurrent bilateral hydronephrosis as well as an enlarging numb malignant versus infectious process in his pelvis. He has back pain, leukocytosis and evidence of infection. Bilateral percutaneous nephrostomy tube placement is warranted for renal decompression and to prevent urosepsis. EXAM: IR NEPHROSTOMY PLACEMENT RIGHT; IR NEPHROSTOMY PLACEMENT LEFT COMPARISON:  None. MEDICATIONS: Patient currently receiving intravenous antibiotics as an inpatient. No additional antibiotics were administered. ANESTHESIA/SEDATION: Dilaudid 1 mg IV ; Versed 4 mg IV Moderate Sedation Time:  22 minutes The patient was continuously monitored during the procedure by the  interventional radiology nurse under my direct supervision. CONTRAST:  20 mL Isovue 370 - administered into the collecting system(s) FLUOROSCOPY TIME:  Fluoroscopy Time: 3 minutes 30 seconds (17 mGy). COMPLICATIONS: None immediate. TECHNIQUE: The procedure, risks, benefits, and alternatives were explained to the patient. Questions regarding the procedure were encouraged and answered. The patient understands and consents to the procedure. The left flank was prepped with chlorhexidine in a sterile fashion, and a sterile drape was applied covering the  operative field. A sterile gown and sterile gloves were used for the procedure. Local anesthesia was provided with 1% Lidocaine. The left flank was interrogated with ultrasound and the left kidney identified. The kidney is hydronephrotic. A suitable access site on the skin overlying the lower pole, posterior calix was identified. After local mg anesthesia was achieved, a small skin nick was made with an 11 blade scalpel. A 21 gauge Accustick needle was then advanced under direct sonographic guidance into the lower pole of the left kidney. A 0.018 inch wire was advanced under fluoroscopic guidance into the left renal collecting system. The Accustick sheath was then advanced over the wire and a 0.018 system exchanged for a 0.035 system. Gentle hand injection of contrast material confirms placement of the sheath within the renal collecting system. There is marked hydronephrosis. The tract from the scan into the renal collecting system was then dilated serially to 10-French. A 10-French Cook all-purpose drain was then placed and positioned under fluoroscopic guidance. The locking loop is well formed within the left renal pelvis. The catheter was secured to the skin with 2-0 Prolene and a sterile bandage was placed. Catheter was left to gravity bag drainage. The right flank was prepped with chlorhexidine in a sterile fashion, and a sterile drape was applied covering the  operative field. A sterile gown and sterile gloves were used for the procedure. Local anesthesia was provided with 1% Lidocaine. The right flank was interrogated with ultrasound and the left kidney identified. The kidney is hydronephrotic. A suitable access site on the skin overlying the lower pole, posterior calix was identified. After local mg anesthesia was achieved, a small skin nick was made with an 11 blade scalpel. A 21 gauge Accustick needle was then advanced under direct sonographic guidance into the lower pole of the right kidney. A 0.018 inch wire was advanced under fluoroscopic guidance into the left renal collecting system. The Accustick sheath was then advanced over the wire and a 0.018 system exchanged for a 0.035 system. Gentle hand injection of contrast material confirms placement of the sheath within the renal collecting system. There is marked hydronephrosis. The tract from the scan into the renal collecting system was then dilated serially to 10-French. A 10-French Cook all-purpose drain was then placed and positioned under fluoroscopic guidance. The locking loop is well formed within the left renal pelvis. The catheter was secured to the skin with 2-0 Prolene and a sterile bandage was placed. Catheter was left to gravity bag drainage. IMPRESSION: Successful placement of a bilateral 10 French percutaneous nephrostomy tubes. PLAN: Return Interventional Radiology in 8 weeks for nephrostomy tube check and change. If the patient's clinical symptoms do not improve, then the source of his infection is likely the multiloculated process in the presacral space extending into the perineum. This may represent a large complex abscess, or centrally necrotic recurrent tumor, or both (centrally necrotic tumor with superinfection of a necrotic components). CT-guided aspiration and biopsy may be required to fully differentiate the process. Signed, Heath K. McCullough, MD [facility] Electronically Signed   By: Heath  McCullough M.D.   On: 04/08/2017 15:21   Ir Nephrostomy Placement Right  Result Date: 04/08/2017 INDICATION: 51-year-old male with a complex history including past rectal and renal cell carcinoma. He has recurrent bilateral hydronephrosis as well as an enlarging numb malignant versus infectious process in his pelvis. He has back pain, leukocytosis and evidence of infection. Bilateral percutaneous nephrostomy tube placement is warranted for   renal decompression and to prevent urosepsis. EXAM: IR NEPHROSTOMY PLACEMENT RIGHT; IR NEPHROSTOMY PLACEMENT LEFT COMPARISON:  None. MEDICATIONS: Patient currently receiving intravenous antibiotics as an inpatient. No additional antibiotics were administered. ANESTHESIA/SEDATION: Dilaudid 1 mg IV ; Versed 4 mg IV Moderate Sedation Time:  22 minutes The patient was continuously monitored during the procedure by the interventional radiology nurse under my direct supervision. CONTRAST:  20 mL Isovue 370 - administered into the collecting system(s) FLUOROSCOPY TIME:  Fluoroscopy Time: 3 minutes 30 seconds (17 mGy). COMPLICATIONS: None immediate. TECHNIQUE: The procedure, risks, benefits, and alternatives were explained to the patient. Questions regarding the procedure were encouraged and answered. The patient understands and consents to the procedure. The left flank was prepped with chlorhexidine in a sterile fashion, and a sterile drape was applied covering the operative field. A sterile gown and sterile gloves were used for the procedure. Local anesthesia was provided with 1% Lidocaine. The left flank was interrogated with ultrasound and the left kidney identified. The kidney is hydronephrotic. A suitable access site on the skin overlying the lower pole, posterior calix was identified. After local mg anesthesia was achieved, a small skin nick was made with an 11 blade scalpel. A 21 gauge Accustick needle was  then advanced under direct sonographic guidance into the lower pole of the left kidney. A 0.018 inch wire was advanced under fluoroscopic guidance into the left renal collecting system. The Accustick sheath was then advanced over the wire and a 0.018 system exchanged for a 0.035 system. Gentle hand injection of contrast material confirms placement of the sheath within the renal collecting system. There is marked hydronephrosis. The tract from the scan into the renal collecting system was then dilated serially to 10-French. A 10-French Cook all-purpose drain was then placed and positioned under fluoroscopic guidance. The locking loop is well formed within the left renal pelvis. The catheter was secured to the skin with 2-0 Prolene and a sterile bandage was placed. Catheter was left to gravity bag drainage. The right flank was prepped with chlorhexidine in a sterile fashion, and a sterile drape was applied covering the operative field. A sterile gown and sterile gloves were used for the procedure. Local anesthesia was provided with 1% Lidocaine. The right flank was interrogated with ultrasound and the left kidney identified. The kidney is hydronephrotic. A suitable access site on the skin overlying the lower pole, posterior calix was identified. After local mg anesthesia was achieved, a small skin nick was made with an 11 blade scalpel. A 21 gauge Accustick needle was then advanced under direct sonographic guidance into the lower pole of the right kidney. A 0.018 inch wire was advanced under fluoroscopic guidance into the left renal collecting system. The Accustick sheath was then advanced over the wire and a 0.018 system exchanged for a 0.035 system. Gentle hand injection of contrast material confirms placement of the sheath within the renal collecting system. There is marked hydronephrosis. The tract from the scan into the renal collecting system was then dilated serially to 10-French. A 10-French Cook all-purpose  drain was then placed and positioned under fluoroscopic guidance. The locking loop is well formed within the left renal pelvis. The catheter was secured to the skin with 2-0 Prolene and a sterile bandage was placed. Catheter was left to gravity bag drainage. IMPRESSION: Successful placement of a bilateral 10 French percutaneous nephrostomy tubes. PLAN: Return Interventional Radiology in 8 weeks for nephrostomy tube check and change. If the patient's clinical symptoms do not improve, then   the source of his infection is likely the multiloculated process in the presacral space extending into the perineum. This may represent a large complex abscess, or centrally necrotic recurrent tumor, or both (centrally necrotic tumor with superinfection of a necrotic components). CT-guided aspiration and biopsy may be required to fully differentiate the process. Signed, Heath K. McCullough, MD [facility] Electronically Signed   By: Heath  McCullough M.D.   On: 04/08/2017 15:21   Assessment/Plan:  51yo M with metastatic rectal ca s/p resection with ostomy, with suprapubic catheter with known chronic pelvic abscess admitted for intermittent fever/chills and leukocytosis found to have bilateral hydronephrosis but worsening in size of chronic pelvic abscess that possibly is necrotic tumor with secondary infection.  Currently on ceftriaxone for urinary source related infection but would recommend to add metronidazole, and increase dose to ceftriaxone 2gm iv to cover polymicrobial intra-abd infection  When patient goes to or, recommend to get repeat tissue culture to help guide abtx  Cynthia B. Snider MD MPH [facility] [phone]   

## 2017-04-10 ENCOUNTER — Inpatient Hospital Stay (HOSPITAL_COMMUNITY): Payer: Medicare Other | Admitting: Certified Registered Nurse Anesthetist

## 2017-04-10 ENCOUNTER — Encounter (HOSPITAL_COMMUNITY): Payer: Self-pay | Admitting: Surgery

## 2017-04-10 ENCOUNTER — Encounter (HOSPITAL_COMMUNITY)
Admission: EM | Disposition: A | Discharge status: Home Health | Payer: Self-pay | Source: Home / Self Care | Attending: Internal Medicine

## 2017-04-10 DIAGNOSIS — C649 Malignant neoplasm of unspecified kidney, except renal pelvis: Secondary | ICD-10-CM

## 2017-04-10 DIAGNOSIS — K651 Peritoneal abscess: Secondary | ICD-10-CM

## 2017-04-10 DIAGNOSIS — C20 Malignant neoplasm of rectum: Secondary | ICD-10-CM

## 2017-04-10 DIAGNOSIS — D72829 Elevated white blood cell count, unspecified: Secondary | ICD-10-CM

## 2017-04-10 HISTORY — PX: IRRIGATION AND DEBRIDEMENT BUTTOCKS: SHX6601

## 2017-04-10 LAB — CBC WITH DIFFERENTIAL/PLATELET
BASOS PCT: 0 %
Basophils Absolute: 0 10*3/uL (ref 0.0–0.1)
EOS ABS: 0.3 10*3/uL (ref 0.0–0.7)
Eosinophils Relative: 3 %
HEMATOCRIT: 37.3 % — AB (ref 39.0–52.0)
HEMOGLOBIN: 11.9 g/dL — AB (ref 13.0–17.0)
LYMPHS ABS: 1.6 10*3/uL (ref 0.7–4.0)
Lymphocytes Relative: 15 %
MCH: 27.5 pg (ref 26.0–34.0)
MCHC: 31.9 g/dL (ref 30.0–36.0)
MCV: 86.1 fL (ref 78.0–100.0)
Monocytes Absolute: 0.9 10*3/uL (ref 0.1–1.0)
Monocytes Relative: 9 %
NEUTROS ABS: 7.6 10*3/uL (ref 1.7–7.7)
NEUTROS PCT: 73 %
Platelets: 522 10*3/uL — ABNORMAL HIGH (ref 150–400)
RBC: 4.33 MIL/uL (ref 4.22–5.81)
RDW: 17.6 % — ABNORMAL HIGH (ref 11.5–15.5)
WBC: 10.5 10*3/uL (ref 4.0–10.5)

## 2017-04-10 LAB — BASIC METABOLIC PANEL
ANION GAP: 10 (ref 5–15)
BUN: 10 mg/dL (ref 6–20)
CHLORIDE: 97 mmol/L — AB (ref 101–111)
CO2: 27 mmol/L (ref 22–32)
CREATININE: 1.12 mg/dL (ref 0.61–1.24)
Calcium: 8.6 mg/dL — ABNORMAL LOW (ref 8.9–10.3)
GFR calc non Af Amer: >60 mL/min (ref >60)
Glucose, Bld: 114 mg/dL — ABNORMAL HIGH (ref 65–99)
POTASSIUM: 4.6 mmol/L (ref 3.5–5.1)
SODIUM: 134 mmol/L — AB (ref 135–145)

## 2017-04-10 LAB — SURGICAL PCR SCREEN
MRSA, PCR: NEGATIVE
Staphylococcus aureus: POSITIVE — AB

## 2017-04-10 SURGERY — IRRIGATION AND DEBRIDEMENT BUTTOCKS
Anesthesia: General | Site: Buttocks | Laterality: Left

## 2017-04-10 MED ORDER — LACTATED RINGERS IV SOLN
INTRAVENOUS | Status: DC
  Administered 2017-04-10: 08:00:00 via INTRAVENOUS

## 2017-04-10 MED ORDER — BUPIVACAINE LIPOSOME 1.3 % IJ SUSP
20.0000 mL | INTRAMUSCULAR | Status: AC
  Administered 2017-04-10: 20 mL
  Filled 2017-04-10: qty 20

## 2017-04-10 MED ORDER — CHLORHEXIDINE GLUCONATE CLOTH 2 % EX PADS
6.0000 | MEDICATED_PAD | Freq: Every day | CUTANEOUS | Status: DC
  Administered 2017-04-10 – 2017-04-13 (×4): 6 via TOPICAL

## 2017-04-10 MED ORDER — ONDANSETRON HCL 4 MG/2ML IJ SOLN
INTRAMUSCULAR | Status: DC | PRN
  Administered 2017-04-10: 4 mg via INTRAVENOUS

## 2017-04-10 MED ORDER — SUCCINYLCHOLINE CHLORIDE 200 MG/10ML IV SOSY
PREFILLED_SYRINGE | INTRAVENOUS | Status: DC | PRN
  Administered 2017-04-10: 100 mg via INTRAVENOUS

## 2017-04-10 MED ORDER — HYDROMORPHONE HCL 1 MG/ML IJ SOLN
1.0000 mg | INTRAMUSCULAR | Status: DC | PRN
Start: 2017-04-10 — End: 2017-04-12
  Administered 2017-04-10 – 2017-04-12 (×11): 1 mg via INTRAVENOUS
  Filled 2017-04-10 (×11): qty 1

## 2017-04-10 MED ORDER — MIDAZOLAM HCL 2 MG/2ML IJ SOLN
INTRAMUSCULAR | Status: DC | PRN
  Administered 2017-04-10: 2 mg via INTRAVENOUS

## 2017-04-10 MED ORDER — CEFAZOLIN SODIUM-DEXTROSE 2-4 GM/100ML-% IV SOLN
2.0000 g | Freq: Three times a day (TID) | INTRAVENOUS | Status: DC
  Administered 2017-04-10 – 2017-04-11 (×3): 2 g via INTRAVENOUS
  Filled 2017-04-10 (×4): qty 100

## 2017-04-10 MED ORDER — FENTANYL CITRATE (PF) 250 MCG/5ML IJ SOLN
INTRAMUSCULAR | Status: AC
  Filled 2017-04-10: qty 5

## 2017-04-10 MED ORDER — PROPOFOL 10 MG/ML IV BOLUS
INTRAVENOUS | Status: DC | PRN
  Administered 2017-04-10: 150 mg via INTRAVENOUS

## 2017-04-10 MED ORDER — MIDAZOLAM HCL 2 MG/2ML IJ SOLN
INTRAMUSCULAR | Status: AC
  Filled 2017-04-10: qty 2

## 2017-04-10 MED ORDER — MUPIROCIN 2 % EX OINT
1.0000 "application " | TOPICAL_OINTMENT | Freq: Two times a day (BID) | CUTANEOUS | Status: DC
  Administered 2017-04-10 – 2017-04-13 (×6): 1 via NASAL
  Filled 2017-04-10: qty 22

## 2017-04-10 MED ORDER — LIDOCAINE 2% (20 MG/ML) 5 ML SYRINGE
INTRAMUSCULAR | Status: DC | PRN
  Administered 2017-04-10: 100 mg via INTRAVENOUS

## 2017-04-10 MED ORDER — HYDROMORPHONE HCL 1 MG/ML IJ SOLN
INTRAMUSCULAR | Status: DC | PRN
  Administered 2017-04-10: 0.5 mg via INTRAVENOUS

## 2017-04-10 MED ORDER — DEXAMETHASONE SODIUM PHOSPHATE 10 MG/ML IJ SOLN
INTRAMUSCULAR | Status: DC | PRN
  Administered 2017-04-10: 10 mg via INTRAVENOUS

## 2017-04-10 MED ORDER — PHENYLEPHRINE 40 MCG/ML (10ML) SYRINGE FOR IV PUSH (FOR BLOOD PRESSURE SUPPORT)
PREFILLED_SYRINGE | INTRAVENOUS | Status: DC | PRN
  Administered 2017-04-10: 80 ug via INTRAVENOUS

## 2017-04-10 MED ORDER — LIDOCAINE 2% (20 MG/ML) 5 ML SYRINGE
INTRAMUSCULAR | Status: AC
  Filled 2017-04-10: qty 5

## 2017-04-10 MED ORDER — PROMETHAZINE HCL 25 MG/ML IJ SOLN: 6.2500 mg | INTRAMUSCULAR | Status: DC | PRN

## 2017-04-10 MED ORDER — SUCCINYLCHOLINE CHLORIDE 200 MG/10ML IV SOSY
PREFILLED_SYRINGE | INTRAVENOUS | Status: AC
  Filled 2017-04-10: qty 10

## 2017-04-10 MED ORDER — BUPIVACAINE HCL (PF) 0.25 % IJ SOLN
INTRAMUSCULAR | Status: AC
  Filled 2017-04-10: qty 30

## 2017-04-10 MED ORDER — PROPOFOL 10 MG/ML IV BOLUS
INTRAVENOUS | Status: AC
  Filled 2017-04-10: qty 20

## 2017-04-10 MED ORDER — HYDROMORPHONE HCL 1 MG/ML IJ SOLN: 0.2500 mg | INTRAMUSCULAR | Status: DC | PRN

## 2017-04-10 MED ORDER — BUPIVACAINE HCL (PF) 0.25 % IJ SOLN
INTRAMUSCULAR | Status: DC | PRN
  Administered 2017-04-10: 20 mL

## 2017-04-10 SURGICAL SUPPLY — 32 items
CANISTER SUCT 3000ML PPV (MISCELLANEOUS) ×3 IMPLANT
COVER SURGICAL LIGHT HANDLE (MISCELLANEOUS) ×3 IMPLANT
DRAPE UTILITY XL STRL (DRAPES) IMPLANT
DRSG PAD ABDOMINAL 8X10 ST (GAUZE/BANDAGES/DRESSINGS) ×3 IMPLANT
ELECT CAUTERY BLADE 6.4 (BLADE) ×3 IMPLANT
ELECT REM PT RETURN 9FT ADLT (ELECTROSURGICAL) ×3
ELECTRODE REM PT RTRN 9FT ADLT (ELECTROSURGICAL) ×1 IMPLANT
GAUZE PACKING IODOFORM 1 (PACKING) IMPLANT
GAUZE PACKING IODOFORM 2 (PACKING) ×3 IMPLANT
GAUZE SPONGE 4X4 12PLY STRL (GAUZE/BANDAGES/DRESSINGS) ×3 IMPLANT
GLOVE SURG SIGNA 7.5 PF LTX (GLOVE) ×3 IMPLANT
GOWN STRL REUS W/ TWL LRG LVL3 (GOWN DISPOSABLE) ×1 IMPLANT
GOWN STRL REUS W/ TWL XL LVL3 (GOWN DISPOSABLE) ×1 IMPLANT
GOWN STRL REUS W/TWL LRG LVL3 (GOWN DISPOSABLE) ×2
GOWN STRL REUS W/TWL XL LVL3 (GOWN DISPOSABLE) ×2
KIT BASIN OR (CUSTOM PROCEDURE TRAY) ×3 IMPLANT
KIT ROOM TURNOVER OR (KITS) ×3 IMPLANT
NS IRRIG 1000ML POUR BTL (IV SOLUTION) ×3 IMPLANT
PACK GENERAL/GYN (CUSTOM PROCEDURE TRAY) ×3 IMPLANT
PACK LITHOTOMY IV (CUSTOM PROCEDURE TRAY) IMPLANT
PAD ARMBOARD 7.5X6 YLW CONV (MISCELLANEOUS) ×3 IMPLANT
PENCIL BUTTON HOLSTER BLD 10FT (ELECTRODE) ×3 IMPLANT
SPONGE LAP 18X18 X RAY DECT (DISPOSABLE) ×3 IMPLANT
SWAB COLLECTION DEVICE MRSA (MISCELLANEOUS) IMPLANT
SWAB CULTURE ESWAB REG 1ML (MISCELLANEOUS) ×3 IMPLANT
SYR BULB 3OZ (MISCELLANEOUS) ×3 IMPLANT
TOWEL OR 17X24 6PK STRL BLUE (TOWEL DISPOSABLE) ×3 IMPLANT
TOWEL OR 17X26 10 PK STRL BLUE (TOWEL DISPOSABLE) ×3 IMPLANT
TUBE CONNECTING 12'X1/4 (SUCTIONS) ×1
TUBE CONNECTING 12X1/4 (SUCTIONS) ×2 IMPLANT
UNDERPAD 30X30 (UNDERPADS AND DIAPERS) ×3 IMPLANT
YANKAUER SUCT BULB TIP NO VENT (SUCTIONS) ×3 IMPLANT

## 2017-04-10 NOTE — Anesthesia Postprocedure Evaluation (Signed)
Anesthesia Post Note  Patient: Zaydenn Balaguer  Procedure(s) Performed: IRRIGATION AND DEBRIDEMENT BUTTOCKS left abscess (Left Buttocks)     Patient location during evaluation: PACU Anesthesia Type: General Level of consciousness: awake and alert Pain management: pain level controlled Vital Signs Assessment: post-procedure vital signs reviewed and stable Respiratory status: spontaneous breathing, nonlabored ventilation, respiratory function stable and patient connected to nasal cannula oxygen Cardiovascular status: blood pressure returned to baseline and stable Postop Assessment: no apparent nausea or vomiting Anesthetic complications: no    Last Vitals:  Vitals:   04/10/17 1045 04/10/17 1100  BP: (!) 147/117 (!) 105/59  Pulse: 88 85  Resp: 18 20  Temp:    SpO2: 100% 100%    Last Pain:  Vitals:   04/10/17 1327  TempSrc:   PainSc: 4                  Catalina Gravel

## 2017-04-10 NOTE — Anesthesia Preprocedure Evaluation (Addendum)
Anesthesia Evaluation  Patient identified by MRN, date of birth, ID band Patient awake    Reviewed: Allergy & Precautions, NPO status , Patient's Chart, lab work & pertinent test results  Airway Mallampati: II  TM Distance: >3 FB Neck ROM: Full    Dental  (+) Teeth Intact, Dental Advisory Given   Pulmonary Current Smoker,  Lung cancer   Pulmonary exam normal breath sounds clear to auscultation       Cardiovascular Exercise Tolerance: Good negative cardio ROS Normal cardiovascular exam Rhythm:Regular Rate:Normal     Neuro/Psych PSYCHIATRIC DISORDERS Anxiety Depression  Neuromuscular disease    GI/Hepatic Neg liver ROS, Left buttock abscess Rectal cancer   Endo/Other  Obesity   Renal/GU Renal diseaseRenal cell cancer   Suprapubic catheter     Musculoskeletal  (+) Arthritis , Osteoarthritis,    Abdominal   Peds  Hematology  (+) Blood dyscrasia, anemia ,   Anesthesia Other Findings Day of surgery medications reviewed with the patient.  Reproductive/Obstetrics                            Anesthesia Physical Anesthesia Plan  ASA: IV  Anesthesia Plan: General   Post-op Pain Management:    Induction: Intravenous  PONV Risk Score and Plan: 1 and Ondansetron and Dexamethasone  Airway Management Planned: Oral ETT  Additional Equipment:   Intra-op Plan:   Post-operative Plan: Extubation in OR  Informed Consent: I have reviewed the patients History and Physical, chart, labs and discussed the procedure including the risks, benefits and alternatives for the proposed anesthesia with the patient or authorized representative who has indicated his/her understanding and acceptance.   Dental advisory given  Plan Discussed with: CRNA  Anesthesia Plan Comments: (Risks/benefits of general anesthesia discussed with patient including risk of damage to teeth, lips, gum, and tongue,  nausea/vomiting, allergic reactions to medications, and the possibility of heart attack, stroke and death.  All patient questions answered.  Patient wishes to proceed.)        Anesthesia Quick Evaluation

## 2017-04-10 NOTE — Progress Notes (Signed)
Patient's surgical pcr positive for staph. Staph standing order protocol initiated.

## 2017-04-10 NOTE — Progress Notes (Signed)
[facility] PROGRESS NOTE  Andrew Kline VOH:[id number] DOB: 26-Jan-1966 DOA: 04/07/2017 PCP: Marco Collie, MD  Assessment/Plan:  UTI Sepsis (leukocytosis, fever, tachycardia) Blood culture NG x 2d, 04/07/17 Urine culture with multiple species: >=100,000 COLONIES/mL SERRATIA MARCESCENS  >=100,000 COLONIES/mL ALCALIGENES FAECALIS  80,000 COLONIES/mL PSEUDOMONAS AERUGINOSA  80,000 COLONIES/mL CITROBACTER FREUNDII abx per ID Consult apprecaited  Gluteal drainage S/p INCISION AND DRAINAGE LEFT BUTTOCK ABSCESS   Hydronephrosis IR consulted for percutaneous nephrostomy, completed with no complications on 69/4  Back Pain Started OxyContin Prn diluadid  Depression with anxiety No SI /HI Cont Cymbalta & Elavil  Previously on hospice but now not per patient, is trying to get with [facility]  Code Status: FC Family Communication: no family at bedside Disposition Plan: Home   Consultants:  ID  surgery  HPI/Subjective: No SOB, no CP  Objective: Vitals:   04/10/17 1045 04/10/17 1100  BP: (!) 147/117 (!) 105/59  Pulse: 88 85  Resp: 18 20  Temp:    SpO2: 100% 100%    Intake/Output Summary (Last 24 hours) at 04/10/2017 1348 Last data filed at 04/10/2017 1333 Gross per 24 hour  Intake 1570 ml  Output 3010 ml  Net -1440 ml   Filed Weights   04/08/17 0407 04/09/17 0520 04/10/17 0424  Weight: 87 kg (191 lb 12.8 oz) 86.3 kg (190 lb 4.1 oz) 85.8 kg (189 lb 3.2 oz)    Exam:   General:  In bed  Cardiovascular: rrr  Respiratory: clear  Abdomen: +BS, soft  Musculoskeletal: moving all ext   Data Reviewed: Basic Metabolic Panel: Recent Labs  Lab 04/07/17 1520 04/09/17 1009 04/10/17 0428  NA 132* 129* 134*  K 4.2 4.2 4.6  CL 95* 97* 97*  CO2 26 22 27   GLUCOSE 180* 255* 114*  BUN 7 10 10   CREATININE 0.92 1.17 1.12  CALCIUM 9.2 8.1* 8.6*   Liver Function Tests: Recent Labs  Lab 04/07/17 1520  AST 23  ALT 47  ALKPHOS 308*   BILITOT 0.4  PROT 8.4*  ALBUMIN 3.0*   No results for input(s): LIPASE, AMYLASE in the last 168 hours. No results for input(s): AMMONIA in the last 168 hours. CBC: Recent Labs  Lab 04/07/17 1520 04/09/17 1009 04/10/17 0428  WBC 17.2* 12.2* 10.5  NEUTROABS 14.0* 9.3* 7.6  HGB 13.1 11.5* 11.9*  HCT 41.8 35.8* 37.3*  MCV 86.0 84.2 86.1  PLT 593* 514* 522*   Cardiac Enzymes: Recent Labs  Lab 04/07/17 2330  TROPONINI <0.03   BNP (last 3 results) No results for input(s): BNP in the last 8760 hours.  ProBNP (last 3 results) No results for input(s): PROBNP in the last 8760 hours.  CBG: No results for input(s): GLUCAP in the last 168 hours.  Recent Results (from the past 240 hour(s))  Culture, blood (Routine x 2)     Status: None (Preliminary result)   Collection Time: 04/07/17  3:20 PM  Result Value Ref Range Status   Specimen Description   Final    LEFT ANTECUBITAL BOTTLES DRAWN AEROBIC AND ANAEROBIC   Special Requests   Final    Blood Culture results may not be optimal due to an inadequate volume of blood received in culture bottles   Culture NO GROWTH 3 DAYS  Final   Report Status PENDING  Incomplete  Culture, blood (Routine x 2)     Status: None (Preliminary result)   Collection Time: 04/07/17  3:20 PM  Result Value Ref Range Status   Specimen  Description   Final    RIGHT ANTECUBITAL BOTTLES DRAWN AEROBIC AND ANAEROBIC   Special Requests Blood Culture adequate volume  Final   Culture NO GROWTH 3 DAYS  Final   Report Status PENDING  Incomplete  Urine culture     Status: Abnormal (Preliminary result)   Collection Time: 04/07/17  7:00 PM  Result Value Ref Range Status   Specimen Description URINE, SUPRAPUBIC  Final   Special Requests NONE  Final   Culture (A)  Final    >=100,000 COLONIES/mL SERRATIA MARCESCENS >=100,000 COLONIES/mL ALCALIGENES FAECALIS 80,000 COLONIES/mL PSEUDOMONAS AERUGINOSA 80,000 COLONIES/mL CITROBACTER FREUNDII    Report Status PENDING   Incomplete  Aerobic Culture (superficial specimen)     Status: None (Preliminary result)   Collection Time: 04/09/17  9:51 AM  Result Value Ref Range Status   Specimen Description ABSCESS  Final   Special Requests NONE  Final   Gram Stain   Final    ABUNDANT WBC PRESENT, PREDOMINANTLY PMN MODERATE GRAM NEGATIVE RODS FEW GRAM POSITIVE RODS FEW GRAM POSITIVE COCCI IN PAIRS    Culture   Final    FEW STREPTOCOCCUS GROUP C RARE STAPHYLOCOCCUS AUREUS CULTURE REINCUBATED FOR BETTER GROWTH    Report Status PENDING  Incomplete  Culture, fungus without smear     Status: None (Preliminary result)   Collection Time: 04/09/17  9:52 AM  Result Value Ref Range Status   Specimen Description WOUND  Final   Special Requests Immunocompromised  Final   Culture NO FUNGUS ISOLATED AFTER 1 DAY  Final   Report Status PENDING  Incomplete  Surgical pcr screen     Status: Abnormal   Collection Time: 04/09/17 11:50 PM  Result Value Ref Range Status   MRSA, PCR NEGATIVE NEGATIVE Final   Staphylococcus aureus POSITIVE (A) NEGATIVE Final    Comment: RESULT CALLED TO, READ BACK BY AND VERIFIED WITH: RNCharlann Boxer [place] [id number] @0438  THANEY (NOTE) The Xpert SA Assay (FDA approved for NASAL specimens in patients 50 years of age and older), is one component of a comprehensive surveillance program. It is not intended to diagnose infection nor to guide or monitor treatment.   Aerobic/Anaerobic Culture (surgical/deep wound)     Status: None (Preliminary result)   Collection Time: 04/10/17  9:41 AM  Result Value Ref Range Status   Specimen Description ABSCESS BUTTOCKS LEFT  Final   Special Requests NONE  Final   Gram Stain   Final    FEW WBC PRESENT, PREDOMINANTLY PMN RARE SQUAMOUS EPITHELIAL CELLS PRESENT RARE GRAM POSITIVE RODS RARE GRAM POSITIVE COCCI IN PAIRS    Culture PENDING  Incomplete   Report Status PENDING  Incomplete     Studies: Ir Nephrostomy Placement Left  Result Date:  04/08/2017 INDICATION: 51 year old male with a complex history including past rectal and renal cell carcinoma. He has recurrent bilateral hydronephrosis as well as an enlarging numb malignant versus infectious process in his pelvis. He has back pain, leukocytosis and evidence of infection. Bilateral percutaneous nephrostomy tube placement is warranted for renal decompression and to prevent urosepsis. EXAM: IR NEPHROSTOMY PLACEMENT RIGHT; IR NEPHROSTOMY PLACEMENT LEFT COMPARISON:  None. MEDICATIONS: Patient currently receiving intravenous antibiotics as an inpatient. No additional antibiotics were administered. ANESTHESIA/SEDATION: Dilaudid 1 mg IV ; Versed 4 mg IV Moderate Sedation Time:  22 minutes The patient was continuously monitored during the procedure by the interventional radiology nurse under my direct supervision. CONTRAST:  20 mL Isovue 370 - administered into the collecting system(s) FLUOROSCOPY TIME:  Fluoroscopy Time: 3 minutes 30 seconds (17 mGy). COMPLICATIONS: None immediate. TECHNIQUE: The procedure, risks, benefits, and alternatives were explained to the patient. Questions regarding the procedure were encouraged and answered. The patient understands and consents to the procedure. The left flank was prepped with chlorhexidine in a sterile fashion, and a sterile drape was applied covering the operative field. A sterile gown and sterile gloves were used for the procedure. Local anesthesia was provided with 1% Lidocaine. The left flank was interrogated with ultrasound and the left kidney identified. The kidney is hydronephrotic. A suitable access site on the skin overlying the lower pole, posterior calix was identified. After local mg anesthesia was achieved, a small skin nick was made with an 11 blade scalpel. A 21 gauge Accustick needle was then advanced under direct sonographic guidance into the lower pole of the left kidney. A 0.018 inch wire was advanced under fluoroscopic guidance into the left  renal collecting system. The Accustick sheath was then advanced over the wire and a 0.018 system exchanged for a 0.035 system. Gentle hand injection of contrast material confirms placement of the sheath within the renal collecting system. There is marked hydronephrosis. The tract from the scan into the renal collecting system was then dilated serially to 10-French. A 10-French Cook all-purpose drain was then placed and positioned under fluoroscopic guidance. The locking loop is well formed within the left renal pelvis. The catheter was secured to the skin with 2-0 Prolene and a sterile bandage was placed. Catheter was left to gravity bag drainage. The right flank was prepped with chlorhexidine in a sterile fashion, and a sterile drape was applied covering the operative field. A sterile gown and sterile gloves were used for the procedure. Local anesthesia was provided with 1% Lidocaine. The right flank was interrogated with ultrasound and the left kidney identified. The kidney is hydronephrotic. A suitable access site on the skin overlying the lower pole, posterior calix was identified. After local mg anesthesia was achieved, a small skin nick was made with an 11 blade scalpel. A 21 gauge Accustick needle was then advanced under direct sonographic guidance into the lower pole of the right kidney. A 0.018 inch wire was advanced under fluoroscopic guidance into the left renal collecting system. The Accustick sheath was then advanced over the wire and a 0.018 system exchanged for a 0.035 system. Gentle hand injection of contrast material confirms placement of the sheath within the renal collecting system. There is marked hydronephrosis. The tract from the scan into the renal collecting system was then dilated serially to 10-French. A 10-French Cook all-purpose drain was then placed and positioned under fluoroscopic guidance. The locking loop is well formed within the left renal pelvis. The catheter was secured to the  skin with 2-0 Prolene and a sterile bandage was placed. Catheter was left to gravity bag drainage. IMPRESSION: Successful placement of a bilateral 10 French percutaneous nephrostomy tubes. PLAN: Return Interventional Radiology in 8 weeks for nephrostomy tube check and change. If the patient's clinical symptoms do not improve, then the source of his infection is likely the multiloculated process in the presacral space extending into the perineum. This may represent a large complex abscess, or centrally necrotic recurrent tumor, or both (centrally necrotic tumor with superinfection of a necrotic components). CT-guided aspiration and biopsy may be required to fully differentiate the process. Signed, Criselda Peaches, MD [facility] Electronically Signed   By: Jacqulynn Cadet M.D.   On: 04/08/2017 15:21  Ir Nephrostomy Placement Right  Result Date: 04/08/2017 INDICATION: 51 year old male with a complex history including past rectal and renal cell carcinoma. He has recurrent bilateral hydronephrosis as well as an enlarging numb malignant versus infectious process in his pelvis. He has back pain, leukocytosis and evidence of infection. Bilateral percutaneous nephrostomy tube placement is warranted for renal decompression and to prevent urosepsis. EXAM: IR NEPHROSTOMY PLACEMENT RIGHT; IR NEPHROSTOMY PLACEMENT LEFT COMPARISON:  None. MEDICATIONS: Patient currently receiving intravenous antibiotics as an inpatient. No additional antibiotics were administered. ANESTHESIA/SEDATION: Dilaudid 1 mg IV ; Versed 4 mg IV Moderate Sedation Time:  22 minutes The patient was continuously monitored during the procedure by the interventional radiology nurse under my direct supervision. CONTRAST:  20 mL Isovue 370 - administered into the collecting system(s) FLUOROSCOPY TIME:  Fluoroscopy Time: 3 minutes 30 seconds (17 mGy). COMPLICATIONS: None immediate. TECHNIQUE: The  procedure, risks, benefits, and alternatives were explained to the patient. Questions regarding the procedure were encouraged and answered. The patient understands and consents to the procedure. The left flank was prepped with chlorhexidine in a sterile fashion, and a sterile drape was applied covering the operative field. A sterile gown and sterile gloves were used for the procedure. Local anesthesia was provided with 1% Lidocaine. The left flank was interrogated with ultrasound and the left kidney identified. The kidney is hydronephrotic. A suitable access site on the skin overlying the lower pole, posterior calix was identified. After local mg anesthesia was achieved, a small skin nick was made with an 11 blade scalpel. A 21 gauge Accustick needle was then advanced under direct sonographic guidance into the lower pole of the left kidney. A 0.018 inch wire was advanced under fluoroscopic guidance into the left renal collecting system. The Accustick sheath was then advanced over the wire and a 0.018 system exchanged for a 0.035 system. Gentle hand injection of contrast material confirms placement of the sheath within the renal collecting system. There is marked hydronephrosis. The tract from the scan into the renal collecting system was then dilated serially to 10-French. A 10-French Cook all-purpose drain was then placed and positioned under fluoroscopic guidance. The locking loop is well formed within the left renal pelvis. The catheter was secured to the skin with 2-0 Prolene and a sterile bandage was placed. Catheter was left to gravity bag drainage. The right flank was prepped with chlorhexidine in a sterile fashion, and a sterile drape was applied covering the operative field. A sterile gown and sterile gloves were used for the procedure. Local anesthesia was provided with 1% Lidocaine. The right flank was interrogated with ultrasound and the left kidney identified. The kidney is hydronephrotic. A suitable  access site on the skin overlying the lower pole, posterior calix was identified. After local mg anesthesia was achieved, a small skin nick was made with an 11 blade scalpel. A 21 gauge Accustick needle was then advanced under direct sonographic guidance into the lower pole of the right kidney. A 0.018 inch wire was advanced under fluoroscopic guidance into the left renal collecting system. The Accustick sheath was then advanced over the wire and a 0.018 system exchanged for a 0.035 system. Gentle hand injection of contrast material confirms placement of the sheath within the renal collecting system. There is marked hydronephrosis. The tract from the scan into the renal collecting system was then dilated serially to 10-French. A 10-French Cook all-purpose drain was then placed and positioned under fluoroscopic guidance. The locking loop is well formed within the left renal  pelvis. The catheter was secured to the skin with 2-0 Prolene and a sterile bandage was placed. Catheter was left to gravity bag drainage. IMPRESSION: Successful placement of a bilateral 10 French percutaneous nephrostomy tubes. PLAN: Return Interventional Radiology in 8 weeks for nephrostomy tube check and change. If the patient's clinical symptoms do not improve, then the source of his infection is likely the multiloculated process in the presacral space extending into the perineum. This may represent a large complex abscess, or centrally necrotic recurrent tumor, or both (centrally necrotic tumor with superinfection of a necrotic components). CT-guided aspiration and biopsy may be required to fully differentiate the process. Signed, Criselda Peaches, MD [facility] Electronically Signed   By: Jacqulynn Cadet M.D.   On: 04/08/2017 15:21    Scheduled Meds: . amitriptyline  50 mg Oral QHS  . Chlorhexidine Gluconate Cloth  6 each Topical Daily  . DULoxetine  60 mg Oral Daily  .  lidocaine-EPINEPHrine  10 mL Infiltration Once  . mupirocin ointment  1 application Nasal BID  . oxyCODONE  10 mg Oral Q12H  . pregabalin  150 mg Oral BID  . temazepam  15 mg Oral QHS   Continuous Infusions: .  ceFAZolin (ANCEF) IV Stopped (04/10/17 1339)  . lactated ringers 10 mL/hr at 04/10/17 0829  . metronidazole 500 mg (04/10/17 0504)    Principal Problem:   Leukocytosis Active Problems:   Hyponatremia   Hydronephrosis   Tachycardia    Time spent: [facility] Pager [place]. If 7PM-7AM, please contact night-coverage at [email or web address], password [facility] 04/10/2017, 1:48 PM  LOS: 3 days

## 2017-04-10 NOTE — Op Note (Addendum)
   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  There is a question whether this represents extension of tumor.  Findings: The patient had a superficial abscess but there was a lot of deep, woody tissue so I am uncertain whether there was deep malignancy involved.  The cultures were obtained and the wound was packed  Procedure: The patient was brought to the operating room and identified as the correct patient.  He was placed supine on the operating table and general anesthesia was induced.  He was then placed in the right lateral decubitus position.  His left buttock was prepped and draped in the usual sterile fashion.  There was already an open wound draining purulence.  I obtained cultures from this.  I then performed an elliptical incision around this area of skin and excised it.  There was an abscess cavity most of the purulence is drained.  I probed deeply into the tissue but cannot feel in extension into the pelvis.  The wound was irrigated.  I anesthetized it with a mixture of Marcaine and Exparel.  I then packed the wound with 2 inch iodoform packing gauze.  Dry gauze was then applied.  The patient tolerated procedure well.  All the counts were correct at the end of the procedure.  The patient was then extubated in the operating room and taken in a stable condition to the recovery room.          Lori Liew A   Date: 04/10/2017  Time: 9:57 AM

## 2017-04-10 NOTE — Anesthesia Procedure Notes (Signed)
Procedure Name: Intubation Date/Time: 04/10/2017 9:29 AM Performed by: Brieann Osinski, Dereck Leep, CRNA Pre-anesthesia Checklist: Patient identified, Patient being monitored, Timeout performed, Emergency Drugs available and Suction available Patient Re-evaluated:Patient Re-evaluated prior to induction Oxygen Delivery Method: Circle System Utilized Preoxygenation: Pre-oxygenation with 100% oxygen Induction Type: IV induction Ventilation: Mask ventilation without difficulty Laryngoscope Size: Miller and 3 Grade View: Grade I Tube type: Oral Tube size: 8.0 mm Number of attempts: 1 Airway Equipment and Method: Stylet Placement Confirmation: ETT inserted through vocal cords under direct vision,  positive ETCO2 and breath sounds checked- equal and bilateral Secured at: 23 cm Tube secured with: Tape Dental Injury: Teeth and Oropharynx as per pre-operative assessment

## 2017-04-10 NOTE — Transfer of Care (Signed)
Immediate Anesthesia Transfer of Care Note  Patient: Andrew Kline  Procedure(s) Performed: IRRIGATION AND DEBRIDEMENT BUTTOCKS left abscess (Left Buttocks)  Patient Location: PACU  Anesthesia Type:General  Level of Consciousness: awake, alert , drowsy and patient cooperative  Airway & Oxygen Therapy: Patient Spontanous Breathing and Patient connected to face mask oxygen  Post-op Assessment: Report given to RN, Post -op Vital signs reviewed and stable and Patient moving all extremities X 4  Post vital signs: Reviewed and stable  Last Vitals:  Vitals:   04/10/17 0431 04/10/17 0803  BP: 106/68 107/70  Pulse: 94 84  Resp: 20 18  Temp: 37.2 C 37.2 C  SpO2: 99% (!) 10%    Last Pain:  Vitals:   04/10/17 0803  TempSrc: Oral  PainSc:       Patients Stated Pain Goal: 3 (67/01/41 0301)  Complications: No apparent anesthesia complications

## 2017-04-10 NOTE — Progress Notes (Signed)
INFECTIOUS DISEASE PROGRESS NOTE  ID: Andrew Kline is a 51 y.o. male with  Principal Problem:   Leukocytosis Active Problems:   Hyponatremia   Hydronephrosis   Tachycardia  Subjective: "Groggy" post surgery  Abtx:  Anti-infectives (From admission, onward)   Start     Dose/Rate Route Frequency Ordered Stop   04/09/17 2300  cefTRIAXone (ROCEPHIN) 2 g in dextrose 5 % 50 mL IVPB     2 g 100 mL/hr over 30 Minutes Intravenous Every 24 hours 04/09/17 1357     04/09/17 1400  metroNIDAZOLE (FLAGYL) IVPB 500 mg     500 mg 100 mL/hr over 60 Minutes Intravenous Every 8 hours 04/09/17 1358     04/07/17 2315  cefTRIAXone (ROCEPHIN) 1 g in dextrose 5 % 50 mL IVPB  Status:  Discontinued     1 g 100 mL/hr over 30 Minutes Intravenous Every 24 hours 04/07/17 2313 04/09/17 1357   04/07/17 2115  metroNIDAZOLE (FLAGYL) IVPB 500 mg     500 mg 100 mL/hr over 60 Minutes Intravenous  Once 04/07/17 2106 04/07/17 2325   04/07/17 1715  cefTRIAXone (ROCEPHIN) 1 g in dextrose 5 % 50 mL IVPB     1 g 100 mL/hr over 30 Minutes Intravenous  Once 04/07/17 1701 04/07/17 1755      Medications:  Scheduled: . amitriptyline  50 mg Oral QHS  . Chlorhexidine Gluconate Cloth  6 each Topical Daily  . DULoxetine  60 mg Oral Daily  . lidocaine-EPINEPHrine  10 mL Infiltration Once  . mupirocin ointment  1 application Nasal BID  . oxyCODONE  10 mg Oral Q12H  . pregabalin  150 mg Oral BID  . temazepam  15 mg Oral QHS    Objective: Vital signs in last 24 hours: Temp:  [97.5 F (36.4 C)-99.2 F (37.3 C)] 97.5 F (36.4 C) (12/10 1030) Pulse Rate:  [84-97] 93 (12/10 1030) Resp:  [11-25] 18 (12/10 1030) BP: (98-123)/(64-72) 98/64 (12/10 1030) SpO2:  [10 %-99 %] 93 % (12/10 1030) Weight:  [85.8 kg (189 lb 3.2 oz)] 85.8 kg (189 lb 3.2 oz) (12/10 0424)   General appearance: alert, cooperative and no distress Resp: clear to auscultation bilaterally Cardio: regular rate and rhythm GI: normal findings: bowel  sounds normal, soft, non-tender and ostomy   Lab Results Recent Labs    04/09/17 1009 04/10/17 0428  WBC 12.2* 10.5  HGB 11.5* 11.9*  HCT 35.8* 37.3*  NA 129* 134*  K 4.2 4.6  CL 97* 97*  CO2 22 27  BUN 10 10  CREATININE 1.17 1.12   Liver Panel Recent Labs    04/07/17 1520  PROT 8.4*  ALBUMIN 3.0*  AST 23  ALT 47  ALKPHOS 308*  BILITOT 0.4   Sedimentation Rate No results for input(s): ESRSEDRATE in the last 72 hours. C-Reactive Protein No results for input(s): CRP in the last 72 hours.  Microbiology: Recent Results (from the past 240 hour(s))  Culture, blood (Routine x 2)     Status: None (Preliminary result)   Collection Time: 04/07/17  3:20 PM  Result Value Ref Range Status   Specimen Description   Final    LEFT ANTECUBITAL BOTTLES DRAWN AEROBIC AND ANAEROBIC   Special Requests   Final    Blood Culture results may not be optimal due to an inadequate volume of blood received in culture bottles   Culture NO GROWTH 3 DAYS  Final   Report Status PENDING  Incomplete  Culture, blood (Routine x  2)     Status: None (Preliminary result)   Collection Time: 04/07/17  3:20 PM  Result Value Ref Range Status   Specimen Description   Final    RIGHT ANTECUBITAL BOTTLES DRAWN AEROBIC AND ANAEROBIC   Special Requests Blood Culture adequate volume  Final   Culture NO GROWTH 3 DAYS  Final   Report Status PENDING  Incomplete  Urine culture     Status: None (Preliminary result)   Collection Time: 04/07/17  7:00 PM  Result Value Ref Range Status   Specimen Description URINE, SUPRAPUBIC  Final   Special Requests NONE  Final   Culture   Final    CULTURE REINCUBATED FOR BETTER GROWTH Performed at [facility], [place] [address], [place], [place] [address]    Report Status PENDING  Incomplete  Aerobic Culture (superficial specimen)     Status: None (Preliminary result)   Collection Time: 04/09/17  9:51 AM  Result Value Ref Range Status   Specimen Description ABSCESS  Final    Special Requests NONE  Final   Gram Stain   Final    ABUNDANT WBC PRESENT, PREDOMINANTLY PMN MODERATE GRAM NEGATIVE RODS FEW GRAM POSITIVE RODS FEW GRAM POSITIVE COCCI IN PAIRS    Culture   Final    FEW STREPTOCOCCUS GROUP C RARE STAPHYLOCOCCUS AUREUS CULTURE REINCUBATED FOR BETTER GROWTH    Report Status PENDING  Incomplete  Culture, fungus without smear     Status: None (Preliminary result)   Collection Time: 04/09/17  9:52 AM  Result Value Ref Range Status   Specimen Description WOUND  Final   Special Requests Immunocompromised  Final   Culture NO FUNGUS ISOLATED AFTER 1 DAY  Final   Report Status PENDING  Incomplete  Surgical pcr screen     Status: Abnormal   Collection Time: 04/09/17 11:50 PM  Result Value Ref Range Status   MRSA, PCR NEGATIVE NEGATIVE Final   Staphylococcus aureus POSITIVE (A) NEGATIVE Final    Comment: RESULT CALLED TO, READ BACK BY AND VERIFIED WITH: RNCharlann Boxer [place] [id number] @[id number]  THANEY (NOTE) The Xpert SA Assay (FDA approved for NASAL specimens in patients 53 years of age and older), is one component of a comprehensive surveillance program. It is not intended to diagnose infection nor to guide or monitor treatment.     Studies/Results: Ir Nephrostomy Placement Left  Result Date: 04/08/2017 INDICATION: 51 year old male with a complex history including past rectal and renal cell carcinoma. He has recurrent bilateral hydronephrosis as well as an enlarging numb malignant versus infectious process in his pelvis. He has back pain, leukocytosis and evidence of infection. Bilateral percutaneous nephrostomy tube placement is warranted for renal decompression and to prevent urosepsis. EXAM: IR NEPHROSTOMY PLACEMENT RIGHT; IR NEPHROSTOMY PLACEMENT LEFT COMPARISON:  None. MEDICATIONS: Patient currently receiving intravenous antibiotics as an inpatient. No additional antibiotics were administered. ANESTHESIA/SEDATION: Dilaudid 1 mg IV ; Versed 4 mg IV  Moderate Sedation Time:  22 minutes The patient was continuously monitored during the procedure by the interventional radiology nurse under my direct supervision. CONTRAST:  20 mL Isovue 370 - administered into the collecting system(s) FLUOROSCOPY TIME:  Fluoroscopy Time: 3 minutes 30 seconds (17 mGy). COMPLICATIONS: None immediate. TECHNIQUE: The procedure, risks, benefits, and alternatives were explained to the patient. Questions regarding the procedure were encouraged and answered. The patient understands and consents to the procedure. The left flank was prepped with chlorhexidine in a sterile fashion, and a sterile drape was applied covering the operative field.  A sterile gown and sterile gloves were used for the procedure. Local anesthesia was provided with 1% Lidocaine. The left flank was interrogated with ultrasound and the left kidney identified. The kidney is hydronephrotic. A suitable access site on the skin overlying the lower pole, posterior calix was identified. After local mg anesthesia was achieved, a small skin nick was made with an 11 blade scalpel. A 21 gauge Accustick needle was then advanced under direct sonographic guidance into the lower pole of the left kidney. A 0.018 inch wire was advanced under fluoroscopic guidance into the left renal collecting system. The Accustick sheath was then advanced over the wire and a 0.018 system exchanged for a 0.035 system. Gentle hand injection of contrast material confirms placement of the sheath within the renal collecting system. There is marked hydronephrosis. The tract from the scan into the renal collecting system was then dilated serially to 10-French. A 10-French Cook all-purpose drain was then placed and positioned under fluoroscopic guidance. The locking loop is well formed within the left renal pelvis. The catheter was secured to the skin with 2-0 Prolene and a sterile bandage was placed. Catheter was left to gravity bag drainage. The right flank  was prepped with chlorhexidine in a sterile fashion, and a sterile drape was applied covering the operative field. A sterile gown and sterile gloves were used for the procedure. Local anesthesia was provided with 1% Lidocaine. The right flank was interrogated with ultrasound and the left kidney identified. The kidney is hydronephrotic. A suitable access site on the skin overlying the lower pole, posterior calix was identified. After local mg anesthesia was achieved, a small skin nick was made with an 11 blade scalpel. A 21 gauge Accustick needle was then advanced under direct sonographic guidance into the lower pole of the right kidney. A 0.018 inch wire was advanced under fluoroscopic guidance into the left renal collecting system. The Accustick sheath was then advanced over the wire and a 0.018 system exchanged for a 0.035 system. Gentle hand injection of contrast material confirms placement of the sheath within the renal collecting system. There is marked hydronephrosis. The tract from the scan into the renal collecting system was then dilated serially to 10-French. A 10-French Cook all-purpose drain was then placed and positioned under fluoroscopic guidance. The locking loop is well formed within the left renal pelvis. The catheter was secured to the skin with 2-0 Prolene and a sterile bandage was placed. Catheter was left to gravity bag drainage. IMPRESSION: Successful placement of a bilateral 10 French percutaneous nephrostomy tubes. PLAN: Return Interventional Radiology in 8 weeks for nephrostomy tube check and change. If the patient's clinical symptoms do not improve, then the source of his infection is likely the multiloculated process in the presacral space extending into the perineum. This may represent a large complex abscess, or centrally necrotic recurrent tumor, or both (centrally necrotic tumor with superinfection of a necrotic components). CT-guided aspiration and biopsy may be required to fully  differentiate the process. Signed, Criselda Peaches, MD [facility] Radiology Electronically Signed   By: Jacqulynn Cadet M.D.   On: 04/08/2017 15:21   Ir Nephrostomy Placement Right  Result Date: 04/08/2017 INDICATION: 51 year old male with a complex history including past rectal and renal cell carcinoma. He has recurrent bilateral hydronephrosis as well as an enlarging numb malignant versus infectious process in his pelvis. He has back pain, leukocytosis and evidence of infection. Bilateral percutaneous nephrostomy tube placement is warranted for renal decompression  and to prevent urosepsis. EXAM: IR NEPHROSTOMY PLACEMENT RIGHT; IR NEPHROSTOMY PLACEMENT LEFT COMPARISON:  None. MEDICATIONS: Patient currently receiving intravenous antibiotics as an inpatient. No additional antibiotics were administered. ANESTHESIA/SEDATION: Dilaudid 1 mg IV ; Versed 4 mg IV Moderate Sedation Time:  22 minutes The patient was continuously monitored during the procedure by the interventional radiology nurse under my direct supervision. CONTRAST:  20 mL Isovue 370 - administered into the collecting system(s) FLUOROSCOPY TIME:  Fluoroscopy Time: 3 minutes 30 seconds (17 mGy). COMPLICATIONS: None immediate. TECHNIQUE: The procedure, risks, benefits, and alternatives were explained to the patient. Questions regarding the procedure were encouraged and answered. The patient understands and consents to the procedure. The left flank was prepped with chlorhexidine in a sterile fashion, and a sterile drape was applied covering the operative field. A sterile gown and sterile gloves were used for the procedure. Local anesthesia was provided with 1% Lidocaine. The left flank was interrogated with ultrasound and the left kidney identified. The kidney is hydronephrotic. A suitable access site on the skin overlying the lower pole, posterior calix was identified. After local mg anesthesia was  achieved, a small skin nick was made with an 11 blade scalpel. A 21 gauge Accustick needle was then advanced under direct sonographic guidance into the lower pole of the left kidney. A 0.018 inch wire was advanced under fluoroscopic guidance into the left renal collecting system. The Accustick sheath was then advanced over the wire and a 0.018 system exchanged for a 0.035 system. Gentle hand injection of contrast material confirms placement of the sheath within the renal collecting system. There is marked hydronephrosis. The tract from the scan into the renal collecting system was then dilated serially to 10-French. A 10-French Cook all-purpose drain was then placed and positioned under fluoroscopic guidance. The locking loop is well formed within the left renal pelvis. The catheter was secured to the skin with 2-0 Prolene and a sterile bandage was placed. Catheter was left to gravity bag drainage. The right flank was prepped with chlorhexidine in a sterile fashion, and a sterile drape was applied covering the operative field. A sterile gown and sterile gloves were used for the procedure. Local anesthesia was provided with 1% Lidocaine. The right flank was interrogated with ultrasound and the left kidney identified. The kidney is hydronephrotic. A suitable access site on the skin overlying the lower pole, posterior calix was identified. After local mg anesthesia was achieved, a small skin nick was made with an 11 blade scalpel. A 21 gauge Accustick needle was then advanced under direct sonographic guidance into the lower pole of the right kidney. A 0.018 inch wire was advanced under fluoroscopic guidance into the left renal collecting system. The Accustick sheath was then advanced over the wire and a 0.018 system exchanged for a 0.035 system. Gentle hand injection of contrast material confirms placement of the sheath within the renal collecting system. There is marked hydronephrosis. The tract from the scan into the  renal collecting system was then dilated serially to 10-French. A 10-French Cook all-purpose drain was then placed and positioned under fluoroscopic guidance. The locking loop is well formed within the left renal pelvis. The catheter was secured to the skin with 2-0 Prolene and a sterile bandage was placed. Catheter was left to gravity bag drainage. IMPRESSION: Successful placement of a bilateral 10 French percutaneous nephrostomy tubes. PLAN: Return Interventional Radiology in 8 weeks for nephrostomy tube check and change. If the patient's clinical symptoms do not improve, then the source  of his infection is likely the multiloculated process in the presacral space extending into the perineum. This may represent a large complex abscess, or centrally necrotic recurrent tumor, or both (centrally necrotic tumor with superinfection of a necrotic components). CT-guided aspiration and biopsy may be required to fully differentiate the process. Signed, Criselda Peaches, MD [facility] Radiology Electronically Signed   By: Jacqulynn Cadet M.D.   On: 04/08/2017 15:21     Assessment/Plan: Rectal CA Renal CA Recurrent/Persistent Peri-rectal abscess  Total days of antibiotics: 3 ceftriaxone/flagyl  Will change ceftriaxone to ancef Await final of Cx (Strep and Staph) Await todays Cx as well as any path.  We spoke at length (> 20 min) about his longevity, prognosis.          Bobby Rumpf MD, FACP Infectious Diseases (pager) [phone] [email or web address] 04/10/2017, 10:58 AM  LOS: 3 days

## 2017-04-10 NOTE — Progress Notes (Signed)
Subjective/Chief Complaint: Left buttock pain persists   Objective: Vital signs in last 24 hours: Temp:  [98.2 F (36.8 C)-99.2 F (37.3 C)] 99 F (37.2 C) (12/10 0431) Pulse Rate:  [86-97] 94 (12/10 0431) Resp:  [18-20] 20 (12/10 0431) BP: (106-123)/(68-72) 106/68 (12/10 0431) SpO2:  [96 %-99 %] 99 % (12/10 0431) Weight:  [85.8 kg (189 lb 3.2 oz)] 85.8 kg (189 lb 3.2 oz) (12/10 0424) Last BM Date: 04/07/17  Intake/Output from previous day: 12/09 0701 - 12/10 0700 In: 1295 [P.O.:720; IV Piggyback:150] Out: 2160 [Urine:2110; Stool:50] Intake/Output this shift: No intake/output data recorded.  Exam:  Left buttock wound draining purulence, tender with erythema  Lab Results:  Recent Labs    04/09/17 1009 04/10/17 0428  WBC 12.2* 10.5  HGB 11.5* 11.9*  HCT 35.8* 37.3*  PLT 514* 522*   BMET Recent Labs    04/09/17 1009 04/10/17 0428  NA 129* 134*  K 4.2 4.6  CL 97* 97*  CO2 22 27  GLUCOSE 255* 114*  BUN 10 10  CREATININE 1.17 1.12  CALCIUM 8.1* 8.6*   PT/INR Recent Labs    04/07/17 1520  LABPROT 14.6  INR 1.15   ABG No results for input(s): PHART, HCO3 in the last 72 hours.  Invalid input(s): PCO2, PO2  Studies/Results: Ir Nephrostomy Placement Left  Result Date: 04/08/2017 INDICATION: 51 year old male with a complex history including past rectal and renal cell carcinoma. He has recurrent bilateral hydronephrosis as well as an enlarging numb malignant versus infectious process in his pelvis. He has back pain, leukocytosis and evidence of infection. Bilateral percutaneous nephrostomy tube placement is warranted for renal decompression and to prevent urosepsis. EXAM: IR NEPHROSTOMY PLACEMENT RIGHT; IR NEPHROSTOMY PLACEMENT LEFT COMPARISON:  None. MEDICATIONS: Patient currently receiving intravenous antibiotics as an inpatient. No additional antibiotics were administered. ANESTHESIA/SEDATION: Dilaudid 1 mg IV ; Versed 4 mg IV Moderate Sedation Time:  22  minutes The patient was continuously monitored during the procedure by the interventional radiology nurse under my direct supervision. CONTRAST:  20 mL Isovue 370 - administered into the collecting system(s) FLUOROSCOPY TIME:  Fluoroscopy Time: 3 minutes 30 seconds (17 mGy). COMPLICATIONS: None immediate. TECHNIQUE: The procedure, risks, benefits, and alternatives were explained to the patient. Questions regarding the procedure were encouraged and answered. The patient understands and consents to the procedure. The left flank was prepped with chlorhexidine in a sterile fashion, and a sterile drape was applied covering the operative field. A sterile gown and sterile gloves were used for the procedure. Local anesthesia was provided with 1% Lidocaine. The left flank was interrogated with ultrasound and the left kidney identified. The kidney is hydronephrotic. A suitable access site on the skin overlying the lower pole, posterior calix was identified. After local mg anesthesia was achieved, a small skin nick was made with an 11 blade scalpel. A 21 gauge Accustick needle was then advanced under direct sonographic guidance into the lower pole of the left kidney. A 0.018 inch wire was advanced under fluoroscopic guidance into the left renal collecting system. The Accustick sheath was then advanced over the wire and a 0.018 system exchanged for a 0.035 system. Gentle hand injection of contrast material confirms placement of the sheath within the renal collecting system. There is marked hydronephrosis. The tract from the scan into the renal collecting system was then dilated serially to 10-French. A 10-French Cook all-purpose drain was then placed and positioned under fluoroscopic guidance. The locking loop is well formed within the left  renal pelvis. The catheter was secured to the skin with 2-0 Prolene and a sterile bandage was placed. Catheter was left to gravity bag drainage. The right flank was prepped with  chlorhexidine in a sterile fashion, and a sterile drape was applied covering the operative field. A sterile gown and sterile gloves were used for the procedure. Local anesthesia was provided with 1% Lidocaine. The right flank was interrogated with ultrasound and the left kidney identified. The kidney is hydronephrotic. A suitable access site on the skin overlying the lower pole, posterior calix was identified. After local mg anesthesia was achieved, a small skin nick was made with an 11 blade scalpel. A 21 gauge Accustick needle was then advanced under direct sonographic guidance into the lower pole of the right kidney. A 0.018 inch wire was advanced under fluoroscopic guidance into the left renal collecting system. The Accustick sheath was then advanced over the wire and a 0.018 system exchanged for a 0.035 system. Gentle hand injection of contrast material confirms placement of the sheath within the renal collecting system. There is marked hydronephrosis. The tract from the scan into the renal collecting system was then dilated serially to 10-French. A 10-French Cook all-purpose drain was then placed and positioned under fluoroscopic guidance. The locking loop is well formed within the left renal pelvis. The catheter was secured to the skin with 2-0 Prolene and a sterile bandage was placed. Catheter was left to gravity bag drainage. IMPRESSION: Successful placement of a bilateral 10 French percutaneous nephrostomy tubes. PLAN: Return Interventional Radiology in 8 weeks for nephrostomy tube check and change. If the patient's clinical symptoms do not improve, then the source of his infection is likely the multiloculated process in the presacral space extending into the perineum. This may represent a large complex abscess, or centrally necrotic recurrent tumor, or both (centrally necrotic tumor with superinfection of a necrotic components). CT-guided aspiration and biopsy may be required to fully differentiate the  process. Signed, Criselda Peaches, MD [facility] Rad[facility] Electronically Signed   By: Jacqulynn Cadet M.D.   On: 04/08/2017 15:21   Ir Nephrostomy Placement Right  Result Date: 04/08/2017 INDICATION: 51 year old male with a complex history including past rectal and renal cell carcinoma. He has recurrent bilateral hydronephrosis as well as an enlarging numb malignant versus infectious process in his pelvis. He has back pain, leukocytosis and evidence of infection. Bilateral percutaneous nephrostomy tube placement is warranted for renal decompression and to prevent urosepsis. EXAM: IR NEPHROSTOMY PLACEMENT RIGHT; IR NEPHROSTOMY PLACEMENT LEFT COMPARISON:  None. MEDICATIONS: Patient currently receiving intravenous antibiotics as an inpatient. No additional antibiotics were administered. ANESTHESIA/SEDATION: Dilaudid 1 mg IV ; Versed 4 mg IV Moderate Sedation Time:  22 minutes The patient was continuously monitored during the procedure by the interventional radiology nurse under my direct supervision. CONTRAST:  20 mL Isovue 370 - administered into the collecting system(s) FLUOROSCOPY TIME:  Fluoroscopy Time: 3 minutes 30 seconds (17 mGy). COMPLICATIONS: None immediate. TECHNIQUE: The procedure, risks, benefits, and alternatives were explained to the patient. Questions regarding the procedure were encouraged and answered. The patient understands and consents to the procedure. The left flank was prepped with chlorhexidine in a sterile fashion, and a sterile drape was applied covering the operative field. A sterile gown and sterile gloves were used for the procedure. Local anesthesia was provided with 1% Lidocaine. The left flank was interrogated with ultrasound and the left kidney identified. The kidney is hydronephrotic. A suitable access site on  the skin overlying the lower pole, posterior calix was identified. After local mg anesthesia was achieved, a small  skin nick was made with an 11 blade scalpel. A 21 gauge Accustick needle was then advanced under direct sonographic guidance into the lower pole of the left kidney. A 0.018 inch wire was advanced under fluoroscopic guidance into the left renal collecting system. The Accustick sheath was then advanced over the wire and a 0.018 system exchanged for a 0.035 system. Gentle hand injection of contrast material confirms placement of the sheath within the renal collecting system. There is marked hydronephrosis. The tract from the scan into the renal collecting system was then dilated serially to 10-French. A 10-French Cook all-purpose drain was then placed and positioned under fluoroscopic guidance. The locking loop is well formed within the left renal pelvis. The catheter was secured to the skin with 2-0 Prolene and a sterile bandage was placed. Catheter was left to gravity bag drainage. The right flank was prepped with chlorhexidine in a sterile fashion, and a sterile drape was applied covering the operative field. A sterile gown and sterile gloves were used for the procedure. Local anesthesia was provided with 1% Lidocaine. The right flank was interrogated with ultrasound and the left kidney identified. The kidney is hydronephrotic. A suitable access site on the skin overlying the lower pole, posterior calix was identified. After local mg anesthesia was achieved, a small skin nick was made with an 11 blade scalpel. A 21 gauge Accustick needle was then advanced under direct sonographic guidance into the lower pole of the right kidney. A 0.018 inch wire was advanced under fluoroscopic guidance into the left renal collecting system. The Accustick sheath was then advanced over the wire and a 0.018 system exchanged for a 0.035 system. Gentle hand injection of contrast material confirms placement of the sheath within the renal collecting system. There is marked hydronephrosis. The tract from the scan into the renal collecting  system was then dilated serially to 10-French. A 10-French Cook all-purpose drain was then placed and positioned under fluoroscopic guidance. The locking loop is well formed within the left renal pelvis. The catheter was secured to the skin with 2-0 Prolene and a sterile bandage was placed. Catheter was left to gravity bag drainage. IMPRESSION: Successful placement of a bilateral 10 French percutaneous nephrostomy tubes. PLAN: Return Interventional Radiology in 8 weeks for nephrostomy tube check and change. If the patient's clinical symptoms do not improve, then the source of his infection is likely the multiloculated process in the presacral space extending into the perineum. This may represent a large complex abscess, or centrally necrotic recurrent tumor, or both (centrally necrotic tumor with superinfection of a necrotic components). CT-guided aspiration and biopsy may be required to fully differentiate the process. Signed, Criselda Peaches, MD [facility] Radiology Electronically Signed   By: Jacqulynn Cadet M.D.   On: 04/08/2017 15:21    Anti-infectives: Anti-infectives (From admission, onward)   Start     Dose/Rate Route Frequency Ordered Stop   04/09/17 2300  cefTRIAXone (ROCEPHIN) 2 g in dextrose 5 % 50 mL IVPB     2 g 100 mL/hr over 30 Minutes Intravenous Every 24 hours 04/09/17 1357     04/09/17 1400  metroNIDAZOLE (FLAGYL) IVPB 500 mg     500 mg 100 mL/hr over 60 Minutes Intravenous Every 8 hours 04/09/17 1358     04/07/17 2315  cefTRIAXone (ROCEPHIN) 1 g in dextrose 5 % 50 mL IVPB  Status:  Discontinued     1 g 100 mL/hr over 30 Minutes Intravenous Every 24 hours 04/07/17 2313 04/09/17 1357   04/07/17 2115  metroNIDAZOLE (FLAGYL) IVPB 500 mg     500 mg 100 mL/hr over 60 Minutes Intravenous  Once 04/07/17 2106 04/07/17 2325   04/07/17 1715  cefTRIAXone (ROCEPHIN) 1 g in dextrose 5 % 50 mL IVPB     1 g 100 mL/hr over 30 Minutes  Intravenous  Once 04/07/17 1701 04/07/17 1755      Assessment/Plan:  Left buttock abscess possibly an extension of metastatic rectal cancer.  From a surgical standpoint, all I can offer is incision and drainage of the left buttock abscess which will hopefully improve his wound care and pain control.  If there is an extension of tumor in the area, it will not heal.  I discussed the risks of the procedure was includes but is not limited to bleeding, infection, need for further procedures, having a chronic draining wound, etc.  He understands and wishes to proceed with surgery which will be scheduled for this morning.   LOS: 3 days    Chaney Maclaren A 04/10/2017

## 2017-04-11 ENCOUNTER — Encounter (HOSPITAL_COMMUNITY): Payer: Self-pay | Admitting: Surgery

## 2017-04-11 LAB — URINE CULTURE: Culture: >=100000 — AB

## 2017-04-11 MED ORDER — CIPROFLOXACIN IN D5W 400 MG/200ML IV SOLN
400.0000 mg | Freq: Two times a day (BID) | INTRAVENOUS | Status: DC
  Filled 2017-04-11: qty 200

## 2017-04-11 MED ORDER — LEVOFLOXACIN IN D5W 750 MG/150ML IV SOLN
750.0000 mg | INTRAVENOUS | Status: DC
  Administered 2017-04-11 – 2017-04-13 (×3): 750 mg via INTRAVENOUS
  Filled 2017-04-11 (×3): qty 150

## 2017-04-11 NOTE — Plan of Care (Signed)
Activity. Patient was up at  bedside with 2 assist and walker. Tolerated well. No complaints of dizziness.

## 2017-04-11 NOTE — Progress Notes (Signed)
INFECTIOUS DISEASE PROGRESS NOTE  ID: Andrew Kline is a 51 y.o. 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  Principal Problem:   Leukocytosis Active Problems:   Hyponatremia   Hydronephrosis   Tachycardia  Subjective: He feels partially improved from yesterday although is still sore. He denies active fevers this morning. He has a lot of questions about the reported findings of bone changes on CT scan from 12/7.  Abtx:  Anti-infectives (From admission, onward)   Start     Dose/Rate Route Frequency Ordered Stop   04/10/17 1400  ceFAZolin (ANCEF) IVPB 2g/100 mL premix     2 g 200 mL/hr over 30 Minutes Intravenous Every 8 hours 04/10/17 1122     04/09/17 2300  cefTRIAXone (ROCEPHIN) 2 g in dextrose 5 % 50 mL IVPB  Status:  Discontinued     2 g 100 mL/hr over 30 Minutes Intravenous Every 24 hours 04/09/17 1357 04/10/17 1122   04/09/17 1400  metroNIDAZOLE (FLAGYL) IVPB 500 mg     500 mg 100 mL/hr over 60 Minutes Intravenous Every 8 hours 04/09/17 1358     04/07/17 2315  cefTRIAXone (ROCEPHIN) 1 g in dextrose 5 % 50 mL IVPB  Status:  Discontinued     1 g 100 mL/hr over 30 Minutes Intravenous Every 24 hours 04/07/17 2313 04/09/17 1357   04/07/17 2115  metroNIDAZOLE (FLAGYL) IVPB 500 mg     500 mg 100 mL/hr over 60 Minutes Intravenous  Once 04/07/17 2106 04/07/17 2325   04/07/17 1715  cefTRIAXone (ROCEPHIN) 1 g in dextrose 5 % 50 mL IVPB     1 g 100 mL/hr over 30 Minutes Intravenous  Once 04/07/17 1701 04/07/17 1755      Medications:  Scheduled: . amitriptyline  50 mg Oral QHS  . Chlorhexidine Gluconate Cloth  6 each Topical Daily  . DULoxetine  60 mg Oral Daily  . lidocaine-EPINEPHrine  10 mL Infiltration Once  . mupirocin ointment  1 application Nasal BID  . oxyCODONE  10 mg Oral Q12H  . pregabalin  150 mg Oral BID  . temazepam  15 mg Oral QHS    Objective: Vital  signs in last 24 hours: Temp:  [97.5 F (36.4 C)-97.6 F (36.4 C)] 97.6 F (36.4 C) (12/11 0351) Pulse Rate:  [76-93] 76 (12/11 0351) Resp:  [18-20] 18 (12/11 0351) BP: (98-147)/(58-117) 114/75 (12/11 0351) SpO2:  [93 %-100 %] 96 % (12/11 0351) Weight:  [189 lb 6.4 oz (85.9 kg)] 189 lb 6.4 oz (85.9 kg) (12/11 0351)   General appearance: alert, cooperative and no distress Resp: clear to auscultation bilaterally Cardio: regular rate and rhythm GI: normal findings: bowel sounds normal, some tenderness in LUQ  Lab Results Recent Labs    04/09/17 1009 04/10/17 0428  WBC 12.2* 10.5  HGB 11.5* 11.9*  HCT 35.8* 37.3*  NA 129* 134*  K 4.2 4.6  CL 97* 97*  CO2 22 27  BUN 10 10  CREATININE 1.17 1.12   Liver Panel No results for input(s): PROT, ALBUMIN, AST, ALT, ALKPHOS, BILITOT, BILIDIR, IBILI in the last 72 hours. Sedimentation Rate No results for input(s): ESRSEDRATE in the last 72 hours. C-Reactive Protein No results for input(s): CRP in the last 72 hours.  Microbiology: Recent Results (from the past 240 hour(s))  Culture, blood (Routine x 2)     Status: None (Preliminary result)   Collection Time: 04/07/17  3:20 PM  Result Value Ref Range Status   Specimen Description   Final    LEFT ANTECUBITAL BOTTLES DRAWN AEROBIC AND ANAEROBIC   Special Requests   Final    Blood Culture results may not be optimal due to an inadequate volume of blood received in culture bottles   Culture NO GROWTH 4 DAYS  Final   Report Status PENDING  Incomplete  Culture, blood (Routine x 2)     Status: None (Preliminary result)   Collection Time: 04/07/17  3:20 PM  Result Value Ref Range Status   Specimen Description   Final    RIGHT ANTECUBITAL BOTTLES DRAWN AEROBIC AND ANAEROBIC   Special Requests Blood Culture adequate volume  Final   Culture NO GROWTH 4 DAYS  Final   Report Status PENDING  Incomplete  Urine culture     Status: Abnormal   Collection Time: 04/07/17  7:00 PM  Result Value  Ref Range Status   Specimen Description URINE, SUPRAPUBIC  Final   Special Requests NONE  Final   Culture (A)  Final    >=100,000 COLONIES/mL SERRATIA MARCESCENS >=100,000 COLONIES/mL ALCALIGENES FAECALIS 80,000 COLONIES/mL PSEUDOMONAS AERUGINOSA 80,000 COLONIES/mL CITROBACTER FREUNDII    Report Status 04/11/2017 FINAL  Final   Organism ID, Bacteria SERRATIA MARCESCENS (A)  Final   Organism ID, Bacteria ALCALIGENES FAECALIS (A)  Final   Organism ID, Bacteria PSEUDOMONAS AERUGINOSA (A)  Final   Organism ID, Bacteria CITROBACTER FREUNDII (A)  Final      Susceptibility   Alcaligenes faecalis - MIC*    CEFEPIME 4 SENSITIVE Sensitive     CEFAZOLIN 32 INTERMEDIATE Intermediate     GENTAMICIN 2 SENSITIVE Sensitive     CIPROFLOXACIN 1 SENSITIVE Sensitive     IMIPENEM 0.5 SENSITIVE Sensitive     TRIMETH/SULFA <=20 SENSITIVE Sensitive     * >=100,000 COLONIES/mL ALCALIGENES FAECALIS   Citrobacter freundii - MIC*    CEFAZOLIN >=64 RESISTANT Resistant     CEFTRIAXONE <=1 SENSITIVE Sensitive     CIPROFLOXACIN <=0.25 SENSITIVE Sensitive     GENTAMICIN <=1 SENSITIVE Sensitive     IMIPENEM <=0.25 SENSITIVE Sensitive     NITROFURANTOIN <=16 SENSITIVE Sensitive     TRIMETH/SULFA <=20 SENSITIVE Sensitive     PIP/TAZO <=4 SENSITIVE Sensitive     * 80,000 COLONIES/mL CITROBACTER FREUNDII   Pseudomonas aeruginosa - MIC*    CEFTAZIDIME 4 SENSITIVE Sensitive     CIPROFLOXACIN <=0.25 SENSITIVE Sensitive     GENTAMICIN 4 SENSITIVE Sensitive     IMIPENEM 2 SENSITIVE Sensitive     PIP/TAZO <=4 SENSITIVE Sensitive     CEFEPIME 4 SENSITIVE Sensitive     * 80,000 COLONIES/mL PSEUDOMONAS AERUGINOSA   Serratia marcescens - MIC*    CEFAZOLIN >=64 RESISTANT Resistant     CEFTRIAXONE <=1 SENSITIVE Sensitive     CIPROFLOXACIN <=0.25 SENSITIVE Sensitive     GENTAMICIN <=1 SENSITIVE Sensitive     NITROFURANTOIN 128 RESISTANT Resistant     TRIMETH/SULFA <=20 SENSITIVE Sensitive     * >=100,000 COLONIES/mL  SERRATIA MARCESCENS  Aerobic Culture (superficial specimen)     Status: None (Preliminary result)   Collection Time: 04/09/17  9:51 AM  Result Value Ref Range Status   Specimen Description ABSCESS  Final   Special Requests NONE  Final   Gram Stain   Final    ABUNDANT WBC PRESENT, PREDOMINANTLY PMN MODERATE GRAM NEGATIVE RODS FEW GRAM POSITIVE RODS FEW GRAM POSITIVE COCCI IN PAIRS    Culture   Final  FEW STREPTOCOCCUS GROUP C RARE STAPHYLOCOCCUS AUREUS CULTURE REINCUBATED FOR BETTER GROWTH    Report Status PENDING  Incomplete  Culture, fungus without smear     Status: None (Preliminary result)   Collection Time: 04/09/17  9:52 AM  Result Value Ref Range Status   Specimen Description WOUND  Final   Special Requests Immunocompromised  Final   Culture NO FUNGUS ISOLATED AFTER 1 DAY  Final   Report Status PENDING  Incomplete  Surgical pcr screen     Status: Abnormal   Collection Time: 04/09/17 11:50 PM  Result Value Ref Range Status   MRSA, PCR NEGATIVE NEGATIVE Final   Staphylococcus aureus POSITIVE (A) NEGATIVE Final    Comment: RESULT CALLED TO, READ BACK BY AND VERIFIED WITH: RNMax Sane [id number] @[id number]  THANEY (NOTE) The Xpert SA Assay (FDA approved for NASAL specimens in patients 25 years of age and older), is one component of a comprehensive surveillance program. It is not intended to diagnose infection nor to guide or monitor treatment.   Aerobic/Anaerobic Culture (surgical/deep wound)     Status: None (Preliminary result)   Collection Time: 04/10/17  9:41 AM  Result Value Ref Range Status   Specimen Description ABSCESS BUTTOCKS LEFT  Final   Special Requests NONE  Final   Gram Stain   Final    FEW WBC PRESENT, PREDOMINANTLY PMN RARE SQUAMOUS EPITHELIAL CELLS PRESENT RARE GRAM POSITIVE RODS RARE GRAM POSITIVE COCCI IN PAIRS    Culture PENDING  Incomplete   Report Status PENDING  Incomplete    Studies/Results: No results  found.   Assessment/Plan: Rectal CA Renal CA Recurrent/Persistent Peri-rectal abscess  Total days of antibiotics: 3 ceftriaxone/flagyl 1 Cefazolin/flagyl His organisms are all susceptible to ciprofloxacin which would probably be a good choice for transition to outpatient treatment as well. Given the abscess with recent surgery we will continue IV abtx today so start IV levaquin at this time and monitor for clinical response. Unclear if his bone lesions are metastatic vs reactive with overlying infection, could consider reimaging after abtx treatment.          Collier Salina, MD PGY-III Internal Medicine Resident Pager# [phone] 04/11/2017, 10:40 AM

## 2017-04-11 NOTE — Progress Notes (Signed)
[facility]/Trauma Progress Note  1 Day Post-Op   Assessment/Plan Principal Problem:   Leukocytosis Active Problems:   Hyponatremia   Hydronephrosis   Tachycardia  Left buttock abscess - S/P incision and drainage, Dr. Ninfa Linden, 12/10 - packing removed today and repacked with wet to dry   ID: Flagyl 12/09>>   Ancef 12/10>> Foley: 2 nephrectomy tubes and a suprapubic cath Follow up: TBD  DISPO: qshift wet to dry dressing changes to wound    LOS: 4 days    Subjective: CC: buttock pain  Pt states pain is well controlled. He is asking about "bone disease" that he just heard about and wants to know what it means. I went over the last 2 CT scans with the pt and his wife from 9/16 and 12/07. Informed pt that it appears to be chronic and possibly osteomyelitis or metastatic disease. I suggested the pt to speak to his oncologist about this further.   Objective: Vital signs in last 24 hours: Temp:  [97.5 F (36.4 C)-98.1 F (36.7 C)] 97.6 F (36.4 C) (12/11 0351) Pulse Rate:  [76-95] 76 (12/11 0351) Resp:  [11-25] 18 (12/11 0351) BP: (98-147)/(58-117) 114/75 (12/11 0351) SpO2:  [93 %-100 %] 96 % (12/11 0351) Weight:  [189 lb 6.4 oz (85.9 kg)] 189 lb 6.4 oz (85.9 kg) (12/11 0351) Last BM Date: 04/09/17  Intake/Output from previous day: 12/10 0701 - 12/11 0700 In: 1702 [P.O.:720; I.V.:782; IV Piggyback:200] Out: 2750 [Urine:2700; Blood:50] Intake/Output this shift: Total I/O In: 240 [P.O.:240] Out: -   PE: Gen:  Alert, NAD, pleasant, cooperative Pulm:  Rate and effort normal Skin: no rashes noted, warm and dry GU: L buttock wound with surrounding erythema and moderate area of induration. Packing removed and no purulent drainage noted. Wound repacked and pt tolerated it well.    Anti-infectives: Anti-infectives (From admission, onward)   Start     Dose/Rate Route Frequency Ordered Stop   04/10/17 1400  ceFAZolin (ANCEF) IVPB 2g/100 mL premix     2  g 200 mL/hr over 30 Minutes Intravenous Every 8 hours 04/10/17 1122     04/09/17 2300  cefTRIAXone (ROCEPHIN) 2 g in dextrose 5 % 50 mL IVPB  Status:  Discontinued     2 g 100 mL/hr over 30 Minutes Intravenous Every 24 hours 04/09/17 1357 04/10/17 1122   04/09/17 1400  metroNIDAZOLE (FLAGYL) IVPB 500 mg     500 mg 100 mL/hr over 60 Minutes Intravenous Every 8 hours 04/09/17 1358     04/07/17 2315  cefTRIAXone (ROCEPHIN) 1 g in dextrose 5 % 50 mL IVPB  Status:  Discontinued     1 g 100 mL/hr over 30 Minutes Intravenous Every 24 hours 04/07/17 2313 04/09/17 1357   04/07/17 2115  metroNIDAZOLE (FLAGYL) IVPB 500 mg     500 mg 100 mL/hr over 60 Minutes Intravenous  Once 04/07/17 2106 04/07/17 2325   04/07/17 1715  cefTRIAXone (ROCEPHIN) 1 g in dextrose 5 % 50 mL IVPB     1 g 100 mL/hr over 30 Minutes Intravenous  Once 04/07/17 1701 04/07/17 1755      Lab Results:  Recent Labs    04/09/17 1009 04/10/17 0428  WBC 12.2* 10.5  HGB 11.5* 11.9*  HCT 35.8* 37.3*  PLT 514* 522*   BMET Recent Labs    04/09/17 1009 04/10/17 0428  NA 129* 134*  K 4.2 4.6  CL 97* 97*  CO2 22 27  GLUCOSE 255* 114*  BUN 10 10  CREATININE 1.17 1.12  CALCIUM 8.1* 8.6*   PT/INR No results for input(s): LABPROT, INR in the last 72 hours. CMP     Component Value Date/Time   NA 134 (L) 04/10/2017 0428   K 4.6 04/10/2017 0428   CL 97 (L) 04/10/2017 0428   CO2 27 04/10/2017 0428   GLUCOSE 114 (H) 04/10/2017 0428   BUN 10 04/10/2017 0428   CREATININE 1.12 04/10/2017 0428   CALCIUM 8.6 (L) 04/10/2017 0428   PROT 8.4 (H) 04/07/2017 1520   ALBUMIN 3.0 (L) 04/07/2017 1520   AST 23 04/07/2017 1520   ALT 47 04/07/2017 1520   ALKPHOS 308 (H) 04/07/2017 1520   BILITOT 0.4 04/07/2017 1520   GFRNONAA >60 04/10/2017 0428   GFRAA >60 04/10/2017 0428   Lipase     Component Value Date/Time   LIPASE 23 01/15/2017 1739    Studies/Results: No results found.    Andrew Kline , [facility]  Surgery 04/11/2017, 9:44 AM Pager: [phone] Consults: [phone] Mon-Fri 7:00 am-4:30 pm Sat-Sun 7:00 am-11:30 am

## 2017-04-11 NOTE — Progress Notes (Signed)
Referring Physician(s): Dr. Zannie Cove  Supervising Physician: Corrie Mckusick  Patient Status:  [facility] - In-pt  Chief Complaint: Bilateral hydronephrosis  Subjective: States he can tell a slight difference in back pressure.  Not much change in pain since bilateral tube placement.   Allergies: Oxaliplatin and Fentanyl  Medications: Prior to Admission medications   Medication Sig Start Date End Date Taking? Authorizing Provider  amitriptyline (ELAVIL) 50 MG tablet TAKE ONE TABLET BY MOUTH AT BEDTIME. 05/10/16  Yes Penland, Kelby Fam, MD  DULoxetine (CYMBALTA) 60 MG capsule Take 1 capsule (60 mg total) by mouth daily. 12/31/15  Yes Penland, Kelby Fam, MD  HYDROmorphone (DILAUDID) 4 MG tablet Take 4 mg by mouth every 6 (six) hours as needed (breakthrough pain).    Yes [provider]  morphine (MSIR) 30 MG tablet Take 30 mg by mouth 2 (two) times daily. Take 2 tablets by mouth twice a day   Yes [provider]  OXYGEN Inhale 4 L into the lungs as needed (shortness of breath).   Yes [provider]  predniSONE (DELTASONE) 20 MG tablet Take 20 mg by mouth daily with breakfast.   Yes [provider]  pregabalin (LYRICA) 150 MG capsule Take 150 mg by mouth 2 (two) times daily.   Yes [provider]  promethazine (PHENERGAN) 25 MG tablet Take 25 mg by mouth every 4 (four) hours as needed for nausea or vomiting.   Yes [provider]  temazepam (RESTORIL) 15 MG capsule Take 15 mg by mouth at bedtime.    Yes [provider]  Morphine Sulfate ER 15 MG T12A Take 15 mg by mouth See admin instructions. Take 1 tablet (15 mg) by mouth twice daily - with a 30 mg tablet for a 45 mg dose    [provider]     Vital Signs: BP 122/68 (BP Location: Right Arm)   Pulse 80   Temp (!) 97.4 F (36.3 C) (Oral)   Resp 18   Ht 5\' 5"  (1.651 m)   Wt 189 lb 6.4 oz (85.9 kg) Comment: scale a  SpO2 98%   BMI 31.52 kg/m   Physical Exam    NAD, alert Back:  Bilateral PCNs in place.  Both insertion sites are c/d/i.  Both collection bags full with clear, yellow urine. Non-tender.  Imaging: Dg Chest 2 View  Result Date: 04/07/2017 CLINICAL DATA:  Back pain.  Fever, cough, and chills. EXAM: CHEST  2 VIEW COMPARISON:  03/04/2016. FINDINGS: PowerPort catheter noted looped in the right IJ with its tip tip over the upper superior vena cava. This is in a more superior location than on prior study of 03/04/2016. Heart size normal. Multifocal bilateral pulmonary infiltrates versus mass lesions. Bilateral pneumonia and/or metastatic disease could present in this fashion. These findings are new from prior exam. No pleural effusion or pneumothorax. No acute bony abnormality . IMPRESSION: 1. PowerPort catheter noted looped in the right IJ with its tip over the upper superior vena cava. 2. Multifocal bilateral pulmonary infiltrates and/or mass lesions. Findings consistent with multifocal bilateral pneumonia and/or metastatic disease. These findings are new from prior exam . Electronically Signed   By: Villisca   On: 04/07/2017 17:11   Ct Abdomen Pelvis W Contrast  Result Date: 04/07/2017 CLINICAL DATA:  History of renal carcinoma in 2012. 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 History of right partial nephrectomy. Suprapubic catheter. EXAM: CT ABDOMEN AND PELVIS WITH CONTRAST TECHNIQUE: Multidetector  CT imaging of the abdomen and pelvis was performed using the standard protocol following bolus administration of intravenous contrast. CONTRAST:  129mL ISOVUE-300 IOPAMIDOL (ISOVUE-300) INJECTION 61% COMPARISON:  January 15, 2017 FINDINGS: Lower chest: Interval progression of pulmonary metastases. A representative metastasis in the right lower lobe on series 5, image 2 measures 3.1 cm today versus 2.9 cm previously. The previously measured metastasis in the medial right lower lobe on series 5, image 6 is now  confluent with adjacent nodules making measurement difficult. However, it measures 4.2 today versus 3.6 cm previously. A lingular metastasis on image 7 measures 2.6 cm today versus 2.7 cm previously. There also appear to be a few new small metastases. No other changes in the soft tissues of the chest. Hepatobiliary: No focal liver abnormality is seen. No gallstones, gallbladder wall thickening, or biliary dilatation. Pancreas: Calcifications in the pancreas are consistent with previous pancreatitis. No peripancreatic stranding today to suggest acute pancreatitis. No pancreatic mass noted. Spleen: Normal in size without focal abnormality. Adrenals/Urinary Tract: The adrenal glands are normal. The patient is status post partial nephrectomy on the right at the lower pole. This site is unchanged with no evidence recurrence. No renal masses are seen. There is right-sided hydronephrosis which is moderate to severe. The right ureter is dilated over its proximal half. There is a discrete change in caliber in the pelvis as seen on series 2, image 65 and is not dilated beyond this point. No underlying cause identified. There is persistent moderate to severe hydronephrosis on the left and the left ureter is also dilated along the proximal 2/3 of its length. There is also an abrupt caliber change in the pelvis. The distal most aspect of the left ureter is not dilated. No ureteral stones identified. The bladder is decompressed with a suprapubic catheter which is stable. The perinephric stranding on the left has largely resolved. Delayed images demonstrate contrast pooling in the dilated renal calices. Stomach/Bowel: The stomach is normal in appearance. The small bowel is normal. The patient has a left lower quadrant ostomy with a peristomal hernia containing small bowel loops. These loops of small bowel within the peristomal hernia are borderline measuring up to 3 cm but there is no associated wall thickening. The portion of  colon within the ostomy remains compressed into the right side of the ostomy sac by adjacent small bowel loops but this is stable. The remaining colon is normal. There is no convincing evidence of appendicitis. There is a collection with adjacent stranding in the pelvis which will be described below. The stranding abuts the distal appendix. However, the adjacent appendix is nondilated. Vascular/Lymphatic: Mild atherosclerotic changes in the nonaneurysmal aorta. The iliac vessels are also involved. The lymph nodes are normal. Reproductive: The patient is status post prostatectomy. The irregular fluid collection in the prostatectomy bed remains, demonstrating a thick enhancing wall with prominent surrounding fat stranding, again intimately involved with the pelvic floor musculature and posterior bladder wall. This collection is larger in the interval and appears multiloculated. The collection measures 8.6 by 6.8 cm on series 2, image 79. At this same level of the previous study, the collection measured 5.6 x 4.0 cm. There is also increased inferior extension with loculated components of fluid to both sides of the anal canal on series 2, image 84 which are much larger. The superior extension may be slightly increased in the interval. There appears to be involvement of the adjacent pelvic musculature which is worsened. The periosteal reaction along the anterior margin  of the sacrum is probably mildly worsened in the interval. Other: No free air.  No peritoneal or omental disease identified. Musculoskeletal: Periosteal reaction along the anterior sacral margin as described above. No other bony changes. IMPRESSION: 1. There is now moderate to severe bilateral hydronephrosis, stable on the left and new on the right. There is an abrupt transition in both dilated ureters in the pelvis. No definitive underlying cause identified. No stone. The findings are consistent bilateral obstruction. The perinephric stranding on the left  has almost completely resolved. 2. The chronic irregular thick walled fluid collection in the proctectomy bad has significantly enlarged and worsened since the comparison CT scan. The overall size has increased as described above. Extension into the adjacent pelvic musculature has also worsened. The periosteal reaction along the anterior margin of the adjacent sacrum is mildly worsened suggesting osteomyelitis. This could represent an abscess and/or recurrent tumor. 3. Interval progression of pulmonary metastases. 4. 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 Loops of small bowel within the peristomal hernia are mildly prominent measuring up to 3 cm on coronal imaging. Recommend clinical correlation. 5. Atherosclerotic change in the aorta. Electronically Signed   By: Dorise Bullion III M.D   On: 04/07/2017 20:30   Ir Nephrostomy Placement Left  Result Date: 04/08/2017 INDICATION: 51 year old male with a complex history including past rectal and renal cell carcinoma. He has recurrent bilateral hydronephrosis as well as an enlarging numb malignant versus infectious process in his pelvis. He has back pain, leukocytosis and evidence of infection. Bilateral percutaneous nephrostomy tube placement is warranted for renal decompression and to prevent urosepsis. EXAM: IR NEPHROSTOMY PLACEMENT RIGHT; IR NEPHROSTOMY PLACEMENT LEFT COMPARISON:  None. MEDICATIONS: Patient currently receiving intravenous antibiotics as an inpatient. No additional antibiotics were administered. ANESTHESIA/SEDATION: Dilaudid 1 mg IV ; Versed 4 mg IV Moderate Sedation Time:  22 minutes The patient was continuously monitored during the procedure by the interventional radiology nurse under my direct supervision. CONTRAST:  20 mL Isovue 370 - administered into the collecting system(s) FLUOROSCOPY TIME:  Fluoroscopy Time: 3 minutes 30 seconds (17 mGy). COMPLICATIONS: None immediate. TECHNIQUE: The procedure, risks, benefits, and  alternatives were explained to the patient. Questions regarding the procedure were encouraged and answered. The patient understands and consents to the procedure. The left flank was prepped with chlorhexidine in a sterile fashion, and a sterile drape was applied covering the operative field. A sterile gown and sterile gloves were used for the procedure. Local anesthesia was provided with 1% Lidocaine. The left flank was interrogated with ultrasound and the left kidney identified. The kidney is hydronephrotic. A suitable access site on the skin overlying the lower pole, posterior calix was identified. After local mg anesthesia was achieved, a small skin nick was made with an 11 blade scalpel. A 21 gauge Accustick needle was then advanced under direct sonographic guidance into the lower pole of the left kidney. A 0.018 inch wire was advanced under fluoroscopic guidance into the left renal collecting system. The Accustick sheath was then advanced over the wire and a 0.018 system exchanged for a 0.035 system. Gentle hand injection of contrast material confirms placement of the sheath within the renal collecting system. There is marked hydronephrosis. The tract from the scan into the renal collecting system was then dilated serially to 10-French. A 10-French Cook all-purpose drain was then placed and positioned under fluoroscopic guidance. The locking loop is well formed within the left renal pelvis. The catheter was secured to the skin with  2-0 Prolene and a sterile bandage was placed. Catheter was left to gravity bag drainage. The right flank was prepped with chlorhexidine in a sterile fashion, and a sterile drape was applied covering the operative field. A sterile gown and sterile gloves were used for the procedure. Local anesthesia was provided with 1% Lidocaine. The right flank was interrogated with ultrasound and the left kidney identified. The kidney is hydronephrotic. A suitable access site on the skin overlying  the lower pole, posterior calix was identified. After local mg anesthesia was achieved, a small skin nick was made with an 11 blade scalpel. A 21 gauge Accustick needle was then advanced under direct sonographic guidance into the lower pole of the right kidney. A 0.018 inch wire was advanced under fluoroscopic guidance into the left renal collecting system. The Accustick sheath was then advanced over the wire and a 0.018 system exchanged for a 0.035 system. Gentle hand injection of contrast material confirms placement of the sheath within the renal collecting system. There is marked hydronephrosis. The tract from the scan into the renal collecting system was then dilated serially to 10-French. A 10-French Cook all-purpose drain was then placed and positioned under fluoroscopic guidance. The locking loop is well formed within the left renal pelvis. The catheter was secured to the skin with 2-0 Prolene and a sterile bandage was placed. Catheter was left to gravity bag drainage. IMPRESSION: Successful placement of a bilateral 10 French percutaneous nephrostomy tubes. PLAN: Return Interventional Radiology in 8 weeks for nephrostomy tube check and change. If the patient's clinical symptoms do not improve, then the source of his infection is likely the multiloculated process in the presacral space extending into the perineum. This may represent a large complex abscess, or centrally necrotic recurrent tumor, or both (centrally necrotic tumor with superinfection of a necrotic components). CT-guided aspiration and biopsy may be required to fully differentiate the process. Signed, Criselda Peaches, MD [facility] Electronically Signed   By: Jacqulynn Cadet M.D.   On: 04/08/2017 15:21   Ir Nephrostomy Placement Right  Result Date: 04/08/2017 INDICATION: 51 year old male with a complex history including past rectal and renal cell carcinoma. He has recurrent  bilateral hydronephrosis as well as an enlarging numb malignant versus infectious process in his pelvis. He has back pain, leukocytosis and evidence of infection. Bilateral percutaneous nephrostomy tube placement is warranted for renal decompression and to prevent urosepsis. EXAM: IR NEPHROSTOMY PLACEMENT RIGHT; IR NEPHROSTOMY PLACEMENT LEFT COMPARISON:  None. MEDICATIONS: Patient currently receiving intravenous antibiotics as an inpatient. No additional antibiotics were administered. ANESTHESIA/SEDATION: Dilaudid 1 mg IV ; Versed 4 mg IV Moderate Sedation Time:  22 minutes The patient was continuously monitored during the procedure by the interventional radiology nurse under my direct supervision. CONTRAST:  20 mL Isovue 370 - administered into the collecting system(s) FLUOROSCOPY TIME:  Fluoroscopy Time: 3 minutes 30 seconds (17 mGy). COMPLICATIONS: None immediate. TECHNIQUE: The procedure, risks, benefits, and alternatives were explained to the patient. Questions regarding the procedure were encouraged and answered. The patient understands and consents to the procedure. The left flank was prepped with chlorhexidine in a sterile fashion, and a sterile drape was applied covering the operative field. A sterile gown and sterile gloves were used for the procedure. Local anesthesia was provided with 1% Lidocaine. The left flank was interrogated with ultrasound and the left kidney identified. The kidney is hydronephrotic. A suitable access site on the skin overlying the lower pole, posterior calix was identified.  After local mg anesthesia was achieved, a small skin nick was made with an 11 blade scalpel. A 21 gauge Accustick needle was then advanced under direct sonographic guidance into the lower pole of the left kidney. A 0.018 inch wire was advanced under fluoroscopic guidance into the left renal collecting system. The Accustick sheath was then advanced over the wire and a 0.018 system exchanged for a 0.035 system.  Gentle hand injection of contrast material confirms placement of the sheath within the renal collecting system. There is marked hydronephrosis. The tract from the scan into the renal collecting system was then dilated serially to 10-French. A 10-French Cook all-purpose drain was then placed and positioned under fluoroscopic guidance. The locking loop is well formed within the left renal pelvis. The catheter was secured to the skin with 2-0 Prolene and a sterile bandage was placed. Catheter was left to gravity bag drainage. The right flank was prepped with chlorhexidine in a sterile fashion, and a sterile drape was applied covering the operative field. A sterile gown and sterile gloves were used for the procedure. Local anesthesia was provided with 1% Lidocaine. The right flank was interrogated with ultrasound and the left kidney identified. The kidney is hydronephrotic. A suitable access site on the skin overlying the lower pole, posterior calix was identified. After local mg anesthesia was achieved, a small skin nick was made with an 11 blade scalpel. A 21 gauge Accustick needle was then advanced under direct sonographic guidance into the lower pole of the right kidney. A 0.018 inch wire was advanced under fluoroscopic guidance into the left renal collecting system. The Accustick sheath was then advanced over the wire and a 0.018 system exchanged for a 0.035 system. Gentle hand injection of contrast material confirms placement of the sheath within the renal collecting system. There is marked hydronephrosis. The tract from the scan into the renal collecting system was then dilated serially to 10-French. A 10-French Cook all-purpose drain was then placed and positioned under fluoroscopic guidance. The locking loop is well formed within the left renal pelvis. The catheter was secured to the skin with 2-0 Prolene and a sterile bandage was placed. Catheter was left to gravity bag drainage. IMPRESSION: Successful  placement of a bilateral 10 French percutaneous nephrostomy tubes. PLAN: Return Interventional Radiology in 8 weeks for nephrostomy tube check and change. If the patient's clinical symptoms do not improve, then the source of his infection is likely the multiloculated process in the presacral space extending into the perineum. This may represent a large complex abscess, or centrally necrotic recurrent tumor, or both (centrally necrotic tumor with superinfection of a necrotic components). CT-guided aspiration and biopsy may be required to fully differentiate the process. Signed, Criselda Peaches, MD [facility] Electronically Signed   By: Jacqulynn Cadet M.D.   On: 04/08/2017 15:21    Labs:  CBC: Recent Labs    01/18/17 0629 04/07/17 1520 04/09/17 1009 04/10/17 0428  WBC 13.6* 17.2* 12.2* 10.5  HGB 11.8* 13.1 11.5* 11.9*  HCT 36.8* 41.8 35.8* 37.3*  PLT 274 593* 514* 522*    COAGS: Recent Labs    04/07/17 1520  INR 1.15    BMP: Recent Labs    01/18/17 0629 04/07/17 1520 04/09/17 1009 04/10/17 0428  NA 134* 132* 129* 134*  K 3.2* 4.2 4.2 4.6  CL 102 95* 97* 97*  CO2 25 26 22 27   GLUCOSE 183* 180* 255* 114*  BUN 10 7 10  10  CALCIUM 8.1* 9.2 8.1* 8.6*  CREATININE 0.95 0.92 1.17 1.12  GFRNONAA >60 >60 >60 >60  GFRAA >60 >60 >60 >60    LIVER FUNCTION TESTS: Recent Labs    10/01/16 0531 12/17/16 0530 01/15/17 1739 04/07/17 1520  BILITOT 0.4 0.3 0.3 0.4  AST 31 18 14* 23  ALT 31 16* 11* 47  ALKPHOS 219* 157* 98 308*  PROT 6.2* 6.5 7.5 8.4*  ALBUMIN 2.0* 2.2* 3.1* 3.0*    Assessment and Plan: Bilateral hydronephrosis Patient s/p bilateral PCNs.  States this has alleviated some pressure, but not all of his symptoms.  He is aware that he has multiple medical problems which may be contributing.  RN may change dressing PRN.  Keep collection bags emptied regularly to prevent re-accumulation.  IR to  follow.   Electronically Signed: Docia Barrier, PA 04/11/2017, 12:35 PM   I spent a total of 15 Minutes at the the patient's bedside AND on the patient's hospital floor or unit, greater than 50% of which was counseling/coordinating care for hydronephrosis.

## 2017-04-11 NOTE — Progress Notes (Signed)
Dressing changed. Drainage sanguineous. No odor noted.  Patient tolerated well. No complaints at this time.

## 2017-04-11 NOTE — Progress Notes (Signed)
[facility] PROGRESS NOTE  Andrew Kline ELF:[id number] DOB: Mar 27, 1966 DOA: 04/07/2017 PCP: Marco Collie, MD  Assessment/Plan:  Sepsis (leukocytosis, fever, tachycardia) due to UTI/gluteal drainage Blood culture NGTD Urine culture with multiple species: >=100,000 COLONIES/mL SERRATIA MARCESCENS  >=100,000 COLONIES/mL ALCALIGENES FAECALIS  80,000 COLONIES/mL PSEUDOMONAS AERUGINOSA  80,000 COLONIES/mL CITROBACTER FREUNDII abx per ID-- plan to d/c on cipro when able--- continue IV levaquin while here Consult apprecaited  Gluteal drainage S/p INCISION AND DRAINAGE LEFT BUTTOCK ABCSCESS  -wound care per general surgery  Hydronephrosis IR consulted for percutaneous nephrostomy, completed with no complications on 58/5  Back Pain Started OxyContin Prn diluadid  Depression with anxiety No SI /HI Cont Cymbalta & Elavil  Previously on hospice but now not per patient, is trying to get with [facility]  Code Status: FC Family Communication: no family at bedside Disposition Plan: Home   Consultants:  ID  surgery  HPI/Subjective: Wondering about how long he has to live  Objective: Vitals:   04/11/17 0351 04/11/17 1201  BP: 114/75 122/68  Pulse: 76 80  Resp: 18 18  Temp: 97.6 F (36.4 C) (!) 97.4 F (36.3 C)  SpO2: 96% 98%    Intake/Output Summary (Last 24 hours) at 04/11/2017 1442 Last data filed at 04/11/2017 1305 Gross per 24 hour  Intake 1002 ml  Output 2950 ml  Net -1948 ml   Filed Weights   04/09/17 0520 04/10/17 0424 04/11/17 0351  Weight: 86.3 kg (190 lb 4.1 oz) 85.8 kg (189 lb 3.2 oz) 85.9 kg (189 lb 6.4 oz)    Exam:   General:  NAD  Cardiovascular: rrr  Respiratory: no wheezing  Abdomen: +Bs, soft  Musculoskeletal: min edema   Data Reviewed: Basic Metabolic Panel: Recent Labs  Lab 04/07/17 1520 04/09/17 1009 04/10/17 0428  NA 132* 129* 134*  K 4.2 4.2 4.6  CL 95* 97* 97*  CO2 26 22 27   GLUCOSE 180* 255*  114*  BUN 7 10 10   CREATININE 0.92 1.17 1.12  CALCIUM 9.2 8.1* 8.6*   Liver Function Tests: Recent Labs  Lab 04/07/17 1520  AST 23  ALT 47  ALKPHOS 308*  BILITOT 0.4  PROT 8.4*  ALBUMIN 3.0*   No results for input(s): LIPASE, AMYLASE in the last 168 hours. No results for input(s): AMMONIA in the last 168 hours. CBC: Recent Labs  Lab 04/07/17 1520 04/09/17 1009 04/10/17 0428  WBC 17.2* 12.2* 10.5  NEUTROABS 14.0* 9.3* 7.6  HGB 13.1 11.5* 11.9*  HCT 41.8 35.8* 37.3*  MCV 86.0 84.2 86.1  PLT 593* 514* 522*   Cardiac Enzymes: Recent Labs  Lab 04/07/17 2330  TROPONINI <0.03   BNP (last 3 results) No results for input(s): BNP in the last 8760 hours.  ProBNP (last 3 results) No results for input(s): PROBNP in the last 8760 hours.  CBG: No results for input(s): GLUCAP in the last 168 hours.  Recent Results (from the past 240 hour(s))  Culture, blood (Routine x 2)     Status: None (Preliminary result)   Collection Time: 04/07/17  3:20 PM  Result Value Ref Range Status   Specimen Description   Final    LEFT ANTECUBITAL BOTTLES DRAWN AEROBIC AND ANAEROBIC   Special Requests   Final    Blood Culture results may not be optimal due to an inadequate volume of blood received in culture bottles   Culture NO GROWTH 4 DAYS  Final   Report Status PENDING  Incomplete  Culture, blood (Routine x 2)  Status: None (Preliminary result)   Collection Time: 04/07/17  3:20 PM  Result Value Ref Range Status   Specimen Description   Final    RIGHT ANTECUBITAL BOTTLES DRAWN AEROBIC AND ANAEROBIC   Special Requests Blood Culture adequate volume  Final   Culture NO GROWTH 4 DAYS  Final   Report Status PENDING  Incomplete  Urine culture     Status: Abnormal   Collection Time: 04/07/17  7:00 PM  Result Value Ref Range Status   Specimen Description URINE, SUPRAPUBIC  Final   Special Requests NONE  Final   Culture (A)  Final    >=100,000 COLONIES/mL SERRATIA MARCESCENS >=100,000  COLONIES/mL ALCALIGENES FAECALIS 80,000 COLONIES/mL PSEUDOMONAS AERUGINOSA 80,000 COLONIES/mL CITROBACTER FREUNDII    Report Status 04/11/2017 FINAL  Final   Organism ID, Bacteria SERRATIA MARCESCENS (A)  Final   Organism ID, Bacteria ALCALIGENES FAECALIS (A)  Final   Organism ID, Bacteria PSEUDOMONAS AERUGINOSA (A)  Final   Organism ID, Bacteria CITROBACTER FREUNDII (A)  Final      Susceptibility   Alcaligenes faecalis - MIC*    CEFEPIME 4 SENSITIVE Sensitive     CEFAZOLIN 32 INTERMEDIATE Intermediate     GENTAMICIN 2 SENSITIVE Sensitive     CIPROFLOXACIN 1 SENSITIVE Sensitive     IMIPENEM 0.5 SENSITIVE Sensitive     TRIMETH/SULFA <=20 SENSITIVE Sensitive     * >=100,000 COLONIES/mL ALCALIGENES FAECALIS   Citrobacter freundii - MIC*    CEFAZOLIN >=64 RESISTANT Resistant     CEFTRIAXONE <=1 SENSITIVE Sensitive     CIPROFLOXACIN <=0.25 SENSITIVE Sensitive     GENTAMICIN <=1 SENSITIVE Sensitive     IMIPENEM <=0.25 SENSITIVE Sensitive     NITROFURANTOIN <=16 SENSITIVE Sensitive     TRIMETH/SULFA <=20 SENSITIVE Sensitive     PIP/TAZO <=4 SENSITIVE Sensitive     * 80,000 COLONIES/mL CITROBACTER FREUNDII   Pseudomonas aeruginosa - MIC*    CEFTAZIDIME 4 SENSITIVE Sensitive     CIPROFLOXACIN <=0.25 SENSITIVE Sensitive     GENTAMICIN 4 SENSITIVE Sensitive     IMIPENEM 2 SENSITIVE Sensitive     PIP/TAZO <=4 SENSITIVE Sensitive     CEFEPIME 4 SENSITIVE Sensitive     * 80,000 COLONIES/mL PSEUDOMONAS AERUGINOSA   Serratia marcescens - MIC*    CEFAZOLIN >=64 RESISTANT Resistant     CEFTRIAXONE <=1 SENSITIVE Sensitive     CIPROFLOXACIN <=0.25 SENSITIVE Sensitive     GENTAMICIN <=1 SENSITIVE Sensitive     NITROFURANTOIN 128 RESISTANT Resistant     TRIMETH/SULFA <=20 SENSITIVE Sensitive     * >=100,000 COLONIES/mL SERRATIA MARCESCENS  Aerobic Culture (superficial specimen)     Status: None (Preliminary result)   Collection Time: 04/09/17  9:51 AM  Result Value Ref Range Status    Specimen Description ABSCESS  Final   Special Requests NONE  Final   Gram Stain   Final    ABUNDANT WBC PRESENT, PREDOMINANTLY PMN MODERATE GRAM NEGATIVE RODS FEW GRAM POSITIVE RODS FEW GRAM POSITIVE COCCI IN PAIRS    Culture   Final    FEW STREPTOCOCCUS GROUP C RARE STAPHYLOCOCCUS AUREUS SUSCEPTIBILITIES TO FOLLOW    Report Status PENDING  Incomplete  Culture, fungus without smear     Status: None (Preliminary result)   Collection Time: 04/09/17  9:52 AM  Result Value Ref Range Status   Specimen Description WOUND  Final   Special Requests Immunocompromised  Final   Culture NO FUNGUS ISOLATED AFTER 2 DAYS  Final   Report Status PENDING  Incomplete  Surgical pcr screen     Status: Abnormal   Collection Time: 04/09/17 11:50 PM  Result Value Ref Range Status   MRSA, PCR NEGATIVE NEGATIVE Final   Staphylococcus aureus POSITIVE (A) NEGATIVE Final    Comment: RESULT CALLED TO, READ BACK BY AND VERIFIED WITH: RNCharlann Boxer [employer] [id number] @0438  THANEY (NOTE) The Xpert SA Assay (FDA approved for NASAL specimens in patients 38 years of age and older), is one component of a comprehensive surveillance program. It is not intended to diagnose infection nor to guide or monitor treatment.   Aerobic/Anaerobic Culture (surgical/deep wound)     Status: None (Preliminary result)   Collection Time: 04/10/17  9:41 AM  Result Value Ref Range Status   Specimen Description ABSCESS BUTTOCKS LEFT  Final   Special Requests NONE  Final   Gram Stain   Final    FEW WBC PRESENT, PREDOMINANTLY PMN RARE SQUAMOUS EPITHELIAL CELLS PRESENT RARE GRAM POSITIVE RODS RARE GRAM POSITIVE COCCI IN PAIRS    Culture CULTURE REINCUBATED FOR BETTER GROWTH  Final   Report Status PENDING  Incomplete     Studies: No results found.  Scheduled Meds: . amitriptyline  50 mg Oral QHS  . Chlorhexidine Gluconate Cloth  6 each Topical Daily  . DULoxetine  60 mg Oral Daily  . lidocaine-EPINEPHrine  10 mL Infiltration  Once  . mupirocin ointment  1 application Nasal BID  . oxyCODONE  10 mg Oral Q12H  . pregabalin  150 mg Oral BID  . temazepam  15 mg Oral QHS   Continuous Infusions: . lactated ringers 10 mL/hr at 04/10/17 0829  . levofloxacin (LEVAQUIN) IV Stopped (04/11/17 1330)    Principal Problem:   Leukocytosis Active Problems:   Hyponatremia   Hydronephrosis   Tachycardia    Time spent: 25 min    Geradine Girt  [facility] Pager AMION. If 7PM-7AM, please contact night-coverage at [email or web address], password [facility] 04/11/2017, 2:42 PM  LOS: 4 days

## 2017-04-12 DIAGNOSIS — L0231 Cutaneous abscess of buttock: Secondary | ICD-10-CM

## 2017-04-12 DIAGNOSIS — M8618 Other acute osteomyelitis, other site: Secondary | ICD-10-CM

## 2017-04-12 DIAGNOSIS — K611 Rectal abscess: Secondary | ICD-10-CM

## 2017-04-12 LAB — CULTURE, BLOOD (ROUTINE X 2)
Culture: NO GROWTH
Culture: NO GROWTH
Special Requests: ADEQUATE

## 2017-04-12 LAB — AEROBIC CULTURE W GRAM STAIN (SUPERFICIAL SPECIMEN)

## 2017-04-12 LAB — AEROBIC CULTURE  (SUPERFICIAL SPECIMEN)

## 2017-04-12 MED ORDER — HYDROMORPHONE HCL 2 MG PO TABS
4.0000 mg | ORAL_TABLET | Freq: Four times a day (QID) | ORAL | Status: DC | PRN
  Administered 2017-04-12 – 2017-04-13 (×5): 4 mg via ORAL
  Filled 2017-04-12 (×5): qty 2

## 2017-04-12 MED ORDER — MORPHINE SULFATE ER 30 MG PO TBCR
30.0000 mg | EXTENDED_RELEASE_TABLET | Freq: Two times a day (BID) | ORAL | Status: DC
  Administered 2017-04-12 – 2017-04-13 (×3): 30 mg via ORAL
  Filled 2017-04-12 (×3): qty 1

## 2017-04-12 MED ORDER — HYDROMORPHONE HCL 1 MG/ML IJ SOLN
0.5000 mg | INTRAMUSCULAR | Status: DC | PRN
Start: 2017-04-12 — End: 2017-04-12
  Administered 2017-04-12: 0.5 mg via INTRAVENOUS
  Filled 2017-04-12: qty 0.5

## 2017-04-12 MED ORDER — DICLOFENAC SODIUM 1 % TD GEL
2.0000 g | Freq: Four times a day (QID) | TRANSDERMAL | Status: DC
  Administered 2017-04-12 (×3): 2 g via TOPICAL
  Filled 2017-04-12: qty 100

## 2017-04-12 NOTE — Progress Notes (Addendum)
[facility] PROGRESS NOTE  Andrew Kline JME:[id number] DOB: [birth year] DOA: 04/07/2017 PCP: Marco Collie, MD  HPI: Andrew Kline  is a 51 y.o. 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 The back pain has been going on for about 1.5 weeks.  Found to have an abscess of the left buttock and was taken to the OR on 12/10.  Plan is for 6 weeks of PO cipro/levaquin once discharge plan in place.   Few cultures pending still.      Assessment/Plan:  Sepsis (leukocytosis, fever, tachycardia) due to UTI/gluteal drainage Blood culture NGTD Urine culture with multiple species: >=100,000 COLONIES/mL SERRATIA MARCESCENS  >=100,000 COLONIES/mL ALCALIGENES FAECALIS  80,000 COLONIES/mL PSEUDOMONAS AERUGINOSA  80,000 COLONIES/mL CITROBACTER FREUNDII abx per ID-- plan to d/c on levaquin/cipro when able--- continue IV levaquin while here Consult apprecaited  Gluteal drainage S/p INCISION AND DRAINAGE LEFT BUTTOCK ABCSCESS  -wound care: daily wet to dry dressing changes that maybe his wife could learn to do. Outer dressing may need to be changed more often as needed depending on drainage.  Hydronephrosis IR consulted for percutaneous nephrostomy, completed with no complications on 22/9 -q 6 weeks changes  Back Pain -resume home meds after looking up on the [place] database  Depression with anxiety No SI /HI Cont Cymbalta & Elavil  Previously on hospice but now not per patient- apparently, he was dismissed due to drugs at his home-- see note from care management  Code Status: FC Family Communication: no family at bedside Disposition Plan: Home   Consultants:  ID  surgery  HPI/Subjective: C/o spasm and pain in left hip  Objective: Vitals:   04/11/17 2103 04/12/17 0501  BP: 135/81 (!) 144/77  Pulse: 80 72  Resp: 18 18  Temp: 98.1 F (36.7 C) 97.7 F (36.5 C)  SpO2: 99% 99%     Intake/Output Summary (Last 24 hours) at 04/12/2017 1346 Last data filed at 04/12/2017 1013 Gross per 24 hour  Intake 945 ml  Output 3025 ml  Net -2080 ml   Filed Weights   04/10/17 0424 04/11/17 0351 04/12/17 0501  Weight: 85.8 kg (189 lb 3.2 oz) 85.9 kg (189 lb 6.4 oz) 86.3 kg (190 lb 4.8 oz)    Exam:   General:  In bed, uncomfortable  Cardiovascular: rrr  Respiratory: clear, no wheezing  Abdomen: +BS, soft  Musculoskeletal: no edema   Data Reviewed: Basic Metabolic Panel: Recent Labs  Lab 04/07/17 1520 04/09/17 1009 04/10/17 0428  NA 132* 129* 134*  K 4.2 4.2 4.6  CL 95* 97* 97*  CO2 26 22 27   GLUCOSE 180* 255* 114*  BUN 7 10 10   CREATININE 0.92 1.17 1.12  CALCIUM 9.2 8.1* 8.6*   Liver Function Tests: Recent Labs  Lab 04/07/17 1520  AST 23  ALT 47  ALKPHOS 308*  BILITOT 0.4  PROT 8.4*  ALBUMIN 3.0*   No results for input(s): LIPASE, AMYLASE in the last 168 hours. No results for input(s): AMMONIA in the last 168 hours. CBC: Recent Labs  Lab 04/07/17 1520 04/09/17 1009 04/10/17 0428  WBC 17.2* 12.2* 10.5  NEUTROABS 14.0* 9.3* 7.6  HGB 13.1 11.5* 11.9*  HCT 41.8 35.8* 37.3*  MCV 86.0 84.2 86.1  PLT 593* 514* 522*   Cardiac Enzymes: Recent Labs  Lab 04/07/17 2330  TROPONINI <0.03   BNP (last 3 results) No results for input(s): BNP in the last 8760 hours.  ProBNP (last 3 results) No results for input(s): PROBNP in the last 8760 hours.  CBG: No results for input(s): GLUCAP in the last 168 hours.  Recent Results (from the past 240 hour(s))  Culture, blood (Routine x 2)     Status: None   Collection Time: 04/07/17  3:20 PM  Result Value Ref Range Status   Specimen Description   Final    LEFT ANTECUBITAL BOTTLES DRAWN AEROBIC AND ANAEROBIC   Special Requests   Final    Blood Culture results may not be optimal due to an inadequate volume of blood received in culture bottles   Culture NO GROWTH 5 DAYS  Final   Report Status  04/12/2017 FINAL  Final  Culture, blood (Routine x 2)     Status: None   Collection Time: 04/07/17  3:20 PM  Result Value Ref Range Status   Specimen Description   Final    RIGHT ANTECUBITAL BOTTLES DRAWN AEROBIC AND ANAEROBIC   Special Requests Blood Culture adequate volume  Final   Culture NO GROWTH 5 DAYS  Final   Report Status 04/12/2017 FINAL  Final  Urine culture     Status: Abnormal   Collection Time: 04/07/17  7:00 PM  Result Value Ref Range Status   Specimen Description URINE, SUPRAPUBIC  Final   Special Requests NONE  Final   Culture (A)  Final    >=100,000 COLONIES/mL SERRATIA MARCESCENS >=100,000 COLONIES/mL ALCALIGENES FAECALIS 80,000 COLONIES/mL PSEUDOMONAS AERUGINOSA 80,000 COLONIES/mL CITROBACTER FREUNDII    Report Status 04/11/2017 FINAL  Final   Organism ID, Bacteria SERRATIA MARCESCENS (A)  Final   Organism ID, Bacteria ALCALIGENES FAECALIS (A)  Final   Organism ID, Bacteria PSEUDOMONAS AERUGINOSA (A)  Final   Organism ID, Bacteria CITROBACTER FREUNDII (A)  Final      Susceptibility   Alcaligenes faecalis - MIC*    CEFEPIME 4 SENSITIVE Sensitive     CEFAZOLIN 32 INTERMEDIATE Intermediate     GENTAMICIN 2 SENSITIVE Sensitive     CIPROFLOXACIN 1 SENSITIVE Sensitive     IMIPENEM 0.5 SENSITIVE Sensitive     TRIMETH/SULFA <=20 SENSITIVE Sensitive     * >=100,000 COLONIES/mL ALCALIGENES FAECALIS   Citrobacter freundii - MIC*    CEFAZOLIN >=64 RESISTANT Resistant     CEFTRIAXONE <=1 SENSITIVE Sensitive     CIPROFLOXACIN <=0.25 SENSITIVE Sensitive     GENTAMICIN <=1 SENSITIVE Sensitive     IMIPENEM <=0.25 SENSITIVE Sensitive     NITROFURANTOIN <=16 SENSITIVE Sensitive     TRIMETH/SULFA <=20 SENSITIVE Sensitive     PIP/TAZO <=4 SENSITIVE Sensitive     * 80,000 COLONIES/mL CITROBACTER FREUNDII   Pseudomonas aeruginosa - MIC*    CEFTAZIDIME 4 SENSITIVE Sensitive     CIPROFLOXACIN <=0.25 SENSITIVE Sensitive     GENTAMICIN 4 SENSITIVE Sensitive     IMIPENEM 2  SENSITIVE Sensitive     PIP/TAZO <=4 SENSITIVE Sensitive     CEFEPIME 4 SENSITIVE Sensitive     * 80,000 COLONIES/mL PSEUDOMONAS AERUGINOSA   Serratia marcescens - MIC*    CEFAZOLIN >=64 RESISTANT Resistant     CEFTRIAXONE <=1 SENSITIVE Sensitive     CIPROFLOXACIN <=0.25 SENSITIVE Sensitive     GENTAMICIN <=1 SENSITIVE Sensitive     NITROFURANTOIN 128 RESISTANT Resistant     TRIMETH/SULFA <=20 SENSITIVE Sensitive     * >=100,000 COLONIES/mL SERRATIA MARCESCENS  Aerobic Culture (superficial specimen)     Status: None   Collection Time: 04/09/17  9:51 AM  Result Value Ref Range  Status   Specimen Description ABSCESS  Final   Special Requests NONE  Final   Gram Stain   Final    ABUNDANT WBC PRESENT, PREDOMINANTLY PMN MODERATE GRAM NEGATIVE RODS FEW GRAM POSITIVE RODS FEW GRAM POSITIVE COCCI IN PAIRS    Culture   Final    FEW STREPTOCOCCUS GROUP C RARE STAPHYLOCOCCUS AUREUS    Report Status 04/12/2017 FINAL  Final   Organism ID, Bacteria STAPHYLOCOCCUS AUREUS  Final      Susceptibility   Staphylococcus aureus - MIC*    CIPROFLOXACIN <=0.5 SENSITIVE Sensitive     ERYTHROMYCIN >=8 RESISTANT Resistant     GENTAMICIN <=0.5 SENSITIVE Sensitive     OXACILLIN 0.5 SENSITIVE Sensitive     TETRACYCLINE <=1 SENSITIVE Sensitive     VANCOMYCIN <=0.5 SENSITIVE Sensitive     TRIMETH/SULFA <=10 SENSITIVE Sensitive     CLINDAMYCIN <=0.25 SENSITIVE Sensitive     RIFAMPIN <=0.5 SENSITIVE Sensitive     Inducible Clindamycin NEGATIVE Sensitive     * RARE STAPHYLOCOCCUS AUREUS  Culture, fungus without smear     Status: None (Preliminary result)   Collection Time: 04/09/17  9:52 AM  Result Value Ref Range Status   Specimen Description WOUND  Final   Special Requests Immunocompromised  Final   Culture NO FUNGUS ISOLATED AFTER 3 DAYS  Final   Report Status PENDING  Incomplete  Surgical pcr screen     Status: Abnormal   Collection Time: 04/09/17 11:50 PM  Result Value Ref Range Status   MRSA,  PCR NEGATIVE NEGATIVE Final   Staphylococcus aureus POSITIVE (A) NEGATIVE Final    Comment: RESULT CALLED TO, READ BACK BY AND VERIFIED WITH: RNCharlann Boxer [employer] [id number] @0438  THANEY (NOTE) The Xpert SA Assay (FDA approved for NASAL specimens in patients 45 years of age and older), is one component of a comprehensive surveillance program. It is not intended to diagnose infection nor to guide or monitor treatment.   Aerobic/Anaerobic Culture (surgical/deep wound)     Status: None (Preliminary result)   Collection Time: 04/10/17  9:41 AM  Result Value Ref Range Status   Specimen Description ABSCESS BUTTOCKS LEFT  Final   Special Requests NONE  Final   Gram Stain   Final    FEW WBC PRESENT, PREDOMINANTLY PMN RARE SQUAMOUS EPITHELIAL CELLS PRESENT RARE GRAM POSITIVE RODS RARE GRAM POSITIVE COCCI IN PAIRS    Culture   Final    RARE STREPTOCOCCUS GROUP C RARE STAPHYLOCOCCUS AUREUS SUSCEPTIBILITIES TO FOLLOW NO ANAEROBES ISOLATED; CULTURE IN PROGRESS FOR 5 DAYS    Report Status PENDING  Incomplete     Studies: No results found.  Scheduled Meds: . amitriptyline  50 mg Oral QHS  . Chlorhexidine Gluconate Cloth  6 each Topical Daily  . diclofenac sodium  2 g Topical QID  . DULoxetine  60 mg Oral Daily  . lidocaine-EPINEPHrine  10 mL Infiltration Once  . morphine  30 mg Oral BID  . mupirocin ointment  1 application Nasal BID  . pregabalin  150 mg Oral BID  . temazepam  15 mg Oral QHS   Continuous Infusions: . lactated ringers 10 mL/hr at 04/10/17 0829  . levofloxacin (LEVAQUIN) IV 750 mg (04/12/17 1227)    Principal Problem:   Leukocytosis Active Problems:   Hyponatremia   Hydronephrosis   Tachycardia    Time spent: 25 min    Geradine Girt  [facility] Pager AMION. If 7PM-7AM, please contact night-coverage at [email or web address], password [facility] 04/12/2017, 1:46  PM  LOS: 5 days

## 2017-04-12 NOTE — Progress Notes (Signed)
[facility]/Trauma Progress Note  2 Days Post-Op   Assessment/Plan Principal Problem:   Leukocytosis Active Problems:   Hyponatremia   Hydronephrosis   Tachycardia  Left buttock abscess - S/P incision and drainage, Dr. Ninfa Linden, 12/10 - wet to dry dressing changes q shift  ID: Flagyl 12/09>>   Ancef 12/10>> Foley: 2 nephrectomy tubes and a suprapubic cath Follow up: 2 weeks with [facility] for wound check  DISPO: pt is clear for discharge from a surgical standpoint. He will need daily wet to dry dressing changes that maybe his wife could learn to do. Outer dressing may need to be changed more often as needed depending on drainage. Cultures show organism sensitive to Cipro.     LOS: 5 days    Subjective: CC: buttock pain  Pain is mild and well controlled. Lots of drainage.   Objective: Vital signs in last 24 hours: Temp:  [97.4 F (36.3 C)-98.1 F (36.7 C)] 97.7 F (36.5 C) (12/12 0501) Pulse Rate:  [72-80] 72 (12/12 0501) Resp:  [18] 18 (12/12 0501) BP: (122-144)/(68-81) 144/77 (12/12 0501) SpO2:  [98 %-99 %] 99 % (12/12 0501) Weight:  [190 lb 4.8 oz (86.3 kg)] 190 lb 4.8 oz (86.3 kg) (12/12 0501) Last BM Date: 04/12/17  Intake/Output from previous day: 12/11 0701 - 12/12 0700 In: 1010 [P.O.:960; IV Piggyback:50] Out: 2440 [Urine:3325] Intake/Output this shift: No intake/output data recorded.  PE: Gen:  Alert, NAD, pleasant, cooperative Pulm:  Rate and effort normal Skin: no rashes noted, warm and dry GU: L buttock wound with improved surrounding erythema, improved induration. Packing removed and no purulent drainage noted. Wound repacked and pt tolerated it well.    Anti-infectives: Anti-infectives (From admission, onward)   Start     Dose/Rate Route Frequency Ordered Stop   04/11/17 1200  levofloxacin (LEVAQUIN) IVPB 750 mg     750 mg 100 mL/hr over 90 Minutes Intravenous Every 24 hours 04/11/17 1128     04/11/17 1100  ciprofloxacin  (CIPRO) IVPB 400 mg  Status:  Discontinued     400 mg 200 mL/hr over 60 Minutes Intravenous Every 12 hours 04/11/17 1048 04/11/17 1124   04/10/17 1400  ceFAZolin (ANCEF) IVPB 2g/100 mL premix  Status:  Discontinued     2 g 200 mL/hr over 30 Minutes Intravenous Every 8 hours 04/10/17 1122 04/11/17 1047   04/09/17 2300  cefTRIAXone (ROCEPHIN) 2 g in dextrose 5 % 50 mL IVPB  Status:  Discontinued     2 g 100 mL/hr over 30 Minutes Intravenous Every 24 hours 04/09/17 1357 04/10/17 1122   04/09/17 1400  metroNIDAZOLE (FLAGYL) IVPB 500 mg  Status:  Discontinued     500 mg 100 mL/hr over 60 Minutes Intravenous Every 8 hours 04/09/17 1358 04/11/17 1047   04/07/17 2315  cefTRIAXone (ROCEPHIN) 1 g in dextrose 5 % 50 mL IVPB  Status:  Discontinued     1 g 100 mL/hr over 30 Minutes Intravenous Every 24 hours 04/07/17 2313 04/09/17 1357   04/07/17 2115  metroNIDAZOLE (FLAGYL) IVPB 500 mg     500 mg 100 mL/hr over 60 Minutes Intravenous  Once 04/07/17 2106 04/07/17 2325   04/07/17 1715  cefTRIAXone (ROCEPHIN) 1 g in dextrose 5 % 50 mL IVPB     1 g 100 mL/hr over 30 Minutes Intravenous  Once 04/07/17 1701 04/07/17 1755      Lab Results:  Recent Labs    04/09/17 1009 04/10/17 0428  WBC 12.2* 10.5  HGB  11.5* 11.9*  HCT 35.8* 37.3*  PLT 514* 522*   BMET Recent Labs    04/09/17 1009 04/10/17 0428  NA 129* 134*  K 4.2 4.6  CL 97* 97*  CO2 22 27  GLUCOSE 255* 114*  BUN 10 10  CREATININE 1.17 1.12  CALCIUM 8.1* 8.6*   PT/INR No results for input(s): LABPROT, INR in the last 72 hours. CMP     Component Value Date/Time   NA 134 (L) 04/10/2017 0428   K 4.6 04/10/2017 0428   CL 97 (L) 04/10/2017 0428   CO2 27 04/10/2017 0428   GLUCOSE 114 (H) 04/10/2017 0428   BUN 10 04/10/2017 0428   CREATININE 1.12 04/10/2017 0428   CALCIUM 8.6 (L) 04/10/2017 0428   PROT 8.4 (H) 04/07/2017 1520   ALBUMIN 3.0 (L) 04/07/2017 1520   AST 23 04/07/2017 1520   ALT 47 04/07/2017 1520   ALKPHOS 308  (H) 04/07/2017 1520   BILITOT 0.4 04/07/2017 1520   GFRNONAA >60 04/10/2017 0428   GFRAA >60 04/10/2017 0428   Lipase     Component Value Date/Time   LIPASE 23 01/15/2017 1739    Studies/Results: No results found.    Kalman Drape , [facility] 04/12/2017, 8:36 AM Pager: [phone] Consults: [phone] Mon-Fri 7:00 am-4:30 pm Sat-Sun 7:00 am-11:30 am

## 2017-04-12 NOTE — Progress Notes (Signed)
Will set patient up for routine PCN exchanges in 6 weeks.  It appears he will keep these PCNs as no plans for any other type of intervention per urology.  If that changes, then his exchanges can be cancelled, but otherwise I will arrange for 6 weeks exchanges for routine care.  As long as the drains are draining well, these do not need to be flushed at home.  His dressing can be changed every couple of days or as needed.  Empty the bags when half full and prn.  Henreitta Cea 12:07 PM 04/12/2017

## 2017-04-12 NOTE — Care Management Note (Signed)
Case Management Note  Patient Details  Name: Andrew Kline MRN: [id number] Date of Birth: 11/19/1965  Subjective/Objective:    Leukocytosis              Action/Plan: Patient lives at home with spouse; PCP: Marco Collie, MD; CM talked to patient about [place] choices, pt chose [facility]; Don with [facility] called for arrangements.  Expected Discharge Date:  04/12/2017             Expected Discharge Plan:  [place]  Discharge planning Services  CM Consult  HH Arranged:  RN [facility] Agency:  [facility]  Status of Service:  In process, will continue to follow  Sherrilyn Rist [phone] 04/12/2017, 9:48 AM

## 2017-04-12 NOTE — Discharge Instructions (Signed)
Please call [phone] [facility], Interventional Radiology) if you have questions or concerns regarding your kidney drains.  These should be emptied when half full or otherwise as needed.  Change the dressing around the drain every couple of days or sooner if needed.  Gauze and tape is sufficient to cover it.  WOUND CARE: - dressing to be changed once daily - supplies: sterile saline, kerlix, scissors, ABD pads, tape  - remove dressing and all packing carefully, moistening with sterile saline as needed to avoid packing/internal dressing sticking to the wound. - clean edges of skin around the wound with water/gauze, making sure there is no tape debris or leakage left on skin that could cause skin irritation or breakdown. - dampen and clean kerlix with sterile saline and pack wound from wound base to skin level, making sure to take note of any possible areas of wound tracking, tunneling and packing appropriately. Wound can be packed loosely. Trim kerlix to size if a whole kerlix is not required. - cover wound with a dry ABD pad and secure with tape.  - write the date/time on the dry dressing/tape to better track when the last dressing change occurred. - apply any skin protectant/powder recommended by clinician to protect skin/skin folds. - change dressing as needed if leakage occurs, wound gets contaminated, or patient requests to shower. - patient may shower daily with wound open and following the shower the wound should be dried and a clean dressing placed.

## 2017-04-12 NOTE — Progress Notes (Addendum)
INFECTIOUS DISEASE PROGRESS NOTE  ID: Andrew Kline is a 51 y.o. male with  Principal Problem:   Leukocytosis Active Problems:   Hyponatremia   Hydronephrosis   Tachycardia  Subjective: C/o pain.   Abtx:  Anti-infectives (From admission, onward)   Start     Dose/Rate Route Frequency Ordered Stop   04/11/17 1200  levofloxacin (LEVAQUIN) IVPB 750 mg     750 mg 100 mL/hr over 90 Minutes Intravenous Every 24 hours 04/11/17 1128     04/11/17 1100  ciprofloxacin (CIPRO) IVPB 400 mg  Status:  Discontinued     400 mg 200 mL/hr over 60 Minutes Intravenous Every 12 hours 04/11/17 1048 04/11/17 1124   04/10/17 1400  ceFAZolin (ANCEF) IVPB 2g/100 mL premix  Status:  Discontinued     2 g 200 mL/hr over 30 Minutes Intravenous Every 8 hours 04/10/17 1122 04/11/17 1047   04/09/17 2300  cefTRIAXone (ROCEPHIN) 2 g in dextrose 5 % 50 mL IVPB  Status:  Discontinued     2 g 100 mL/hr over 30 Minutes Intravenous Every 24 hours 04/09/17 1357 04/10/17 1122   04/09/17 1400  metroNIDAZOLE (FLAGYL) IVPB 500 mg  Status:  Discontinued     500 mg 100 mL/hr over 60 Minutes Intravenous Every 8 hours 04/09/17 1358 04/11/17 1047   04/07/17 2315  cefTRIAXone (ROCEPHIN) 1 g in dextrose 5 % 50 mL IVPB  Status:  Discontinued     1 g 100 mL/hr over 30 Minutes Intravenous Every 24 hours 04/07/17 2313 04/09/17 1357   04/07/17 2115  metroNIDAZOLE (FLAGYL) IVPB 500 mg     500 mg 100 mL/hr over 60 Minutes Intravenous  Once 04/07/17 2106 04/07/17 2325   04/07/17 1715  cefTRIAXone (ROCEPHIN) 1 g in dextrose 5 % 50 mL IVPB     1 g 100 mL/hr over 30 Minutes Intravenous  Once 04/07/17 1701 04/07/17 1755      Medications:  Scheduled: . amitriptyline  50 mg Oral QHS  . Chlorhexidine Gluconate Cloth  6 each Topical Daily  . diclofenac sodium  2 g Topical QID  . DULoxetine  60 mg Oral Daily  . lidocaine-EPINEPHrine  10 mL Infiltration Once  . morphine  30 mg Oral BID  . mupirocin ointment  1 application Nasal BID    . pregabalin  150 mg Oral BID  . temazepam  15 mg Oral QHS    Objective: Vital signs in last 24 hours: Temp:  [97.4 F (36.3 C)-98.1 F (36.7 C)] 97.7 F (36.5 C) (12/12 0501) Pulse Rate:  [72-80] 72 (12/12 0501) Resp:  [18] 18 (12/12 0501) BP: (122-144)/(68-81) 144/77 (12/12 0501) SpO2:  [98 %-99 %] 99 % (12/12 0501) Weight:  [86.3 kg (190 lb 4.8 oz)] 86.3 kg (190 lb 4.8 oz) (12/12 0501)   General appearance: alert, cooperative and no distress  Lab Results Recent Labs    04/09/17 1009 04/10/17 0428  WBC 12.2* 10.5  HGB 11.5* 11.9*  HCT 35.8* 37.3*  NA 129* 134*  K 4.2 4.6  CL 97* 97*  CO2 22 27  BUN 10 10  CREATININE 1.17 1.12   Liver Panel No results for input(s): PROT, ALBUMIN, AST, ALT, ALKPHOS, BILITOT, BILIDIR, IBILI in the last 72 hours. Sedimentation Rate No results for input(s): ESRSEDRATE in the last 72 hours. C-Reactive Protein No results for input(s): CRP in the last 72 hours.  Microbiology: Recent Results (from the past 240 hour(s))  Culture, blood (Routine x 2)     Status:  None (Preliminary result)   Collection Time: 04/07/17  3:20 PM  Result Value Ref Range Status   Specimen Description   Final    LEFT ANTECUBITAL BOTTLES DRAWN AEROBIC AND ANAEROBIC   Special Requests   Final    Blood Culture results may not be optimal due to an inadequate volume of blood received in culture bottles   Culture NO GROWTH 4 DAYS  Final   Report Status PENDING  Incomplete  Culture, blood (Routine x 2)     Status: None (Preliminary result)   Collection Time: 04/07/17  3:20 PM  Result Value Ref Range Status   Specimen Description   Final    RIGHT ANTECUBITAL BOTTLES DRAWN AEROBIC AND ANAEROBIC   Special Requests Blood Culture adequate volume  Final   Culture NO GROWTH 4 DAYS  Final   Report Status PENDING  Incomplete  Urine culture     Status: Abnormal   Collection Time: 04/07/17  7:00 PM  Result Value Ref Range Status   Specimen Description URINE, SUPRAPUBIC   Final   Special Requests NONE  Final   Culture (A)  Final    >=100,000 COLONIES/mL SERRATIA MARCESCENS >=100,000 COLONIES/mL ALCALIGENES FAECALIS 80,000 COLONIES/mL PSEUDOMONAS AERUGINOSA 80,000 COLONIES/mL CITROBACTER FREUNDII    Report Status 04/11/2017 FINAL  Final   Organism ID, Bacteria SERRATIA MARCESCENS (A)  Final   Organism ID, Bacteria ALCALIGENES FAECALIS (A)  Final   Organism ID, Bacteria PSEUDOMONAS AERUGINOSA (A)  Final   Organism ID, Bacteria CITROBACTER FREUNDII (A)  Final      Susceptibility   Alcaligenes faecalis - MIC*    CEFEPIME 4 SENSITIVE Sensitive     CEFAZOLIN 32 INTERMEDIATE Intermediate     GENTAMICIN 2 SENSITIVE Sensitive     CIPROFLOXACIN 1 SENSITIVE Sensitive     IMIPENEM 0.5 SENSITIVE Sensitive     TRIMETH/SULFA <=20 SENSITIVE Sensitive     * >=100,000 COLONIES/mL ALCALIGENES FAECALIS   Citrobacter freundii - MIC*    CEFAZOLIN >=64 RESISTANT Resistant     CEFTRIAXONE <=1 SENSITIVE Sensitive     CIPROFLOXACIN <=0.25 SENSITIVE Sensitive     GENTAMICIN <=1 SENSITIVE Sensitive     IMIPENEM <=0.25 SENSITIVE Sensitive     NITROFURANTOIN <=16 SENSITIVE Sensitive     TRIMETH/SULFA <=20 SENSITIVE Sensitive     PIP/TAZO <=4 SENSITIVE Sensitive     * 80,000 COLONIES/mL CITROBACTER FREUNDII   Pseudomonas aeruginosa - MIC*    CEFTAZIDIME 4 SENSITIVE Sensitive     CIPROFLOXACIN <=0.25 SENSITIVE Sensitive     GENTAMICIN 4 SENSITIVE Sensitive     IMIPENEM 2 SENSITIVE Sensitive     PIP/TAZO <=4 SENSITIVE Sensitive     CEFEPIME 4 SENSITIVE Sensitive     * 80,000 COLONIES/mL PSEUDOMONAS AERUGINOSA   Serratia marcescens - MIC*    CEFAZOLIN >=64 RESISTANT Resistant     CEFTRIAXONE <=1 SENSITIVE Sensitive     CIPROFLOXACIN <=0.25 SENSITIVE Sensitive     GENTAMICIN <=1 SENSITIVE Sensitive     NITROFURANTOIN 128 RESISTANT Resistant     TRIMETH/SULFA <=20 SENSITIVE Sensitive     * >=100,000 COLONIES/mL SERRATIA MARCESCENS  Aerobic Culture (superficial specimen)      Status: None (Preliminary result)   Collection Time: 04/09/17  9:51 AM  Result Value Ref Range Status   Specimen Description ABSCESS  Final   Special Requests NONE  Final   Gram Stain   Final    ABUNDANT WBC PRESENT, PREDOMINANTLY PMN MODERATE GRAM NEGATIVE RODS FEW GRAM POSITIVE RODS FEW GRAM POSITIVE  COCCI IN PAIRS    Culture   Final    FEW STREPTOCOCCUS GROUP C RARE STAPHYLOCOCCUS AUREUS SUSCEPTIBILITIES TO FOLLOW    Report Status PENDING  Incomplete  Culture, fungus without smear     Status: None (Preliminary result)   Collection Time: 04/09/17  9:52 AM  Result Value Ref Range Status   Specimen Description WOUND  Final   Special Requests Immunocompromised  Final   Culture NO FUNGUS ISOLATED AFTER 2 DAYS  Final   Report Status PENDING  Incomplete  Surgical pcr screen     Status: Abnormal   Collection Time: 04/09/17 11:50 PM  Result Value Ref Range Status   MRSA, PCR NEGATIVE NEGATIVE Final   Staphylococcus aureus POSITIVE (A) NEGATIVE Final    Comment: RESULT CALLED TO, READ BACK BY AND VERIFIED WITH: RNMax Sane [id number] @0438  THANEY (NOTE) The Xpert SA Assay (FDA approved for NASAL specimens in patients 66 years of age and older), is one component of a comprehensive surveillance program. It is not intended to diagnose infection nor to guide or monitor treatment.   Aerobic/Anaerobic Culture (surgical/deep wound)     Status: None (Preliminary result)   Collection Time: 04/10/17  9:41 AM  Result Value Ref Range Status   Specimen Description ABSCESS BUTTOCKS LEFT  Final   Special Requests NONE  Final   Gram Stain   Final    FEW WBC PRESENT, PREDOMINANTLY PMN RARE SQUAMOUS EPITHELIAL CELLS PRESENT RARE GRAM POSITIVE RODS RARE GRAM POSITIVE COCCI IN PAIRS    Culture CULTURE REINCUBATED FOR BETTER GROWTH  Final   Report Status PENDING  Incomplete    Studies/Results: No results found.   Assessment/Plan: Rectal CA Renal CA Recurrent/Persistent  Peri-rectal abscess  Total days of antibiotics: 5 levaquin  Await his final report on his 12-9 Cx showing staph and strep.  His Cx from 12-10 is also pending.  Otherwise, he can be d/c home and f/u with his PCP. Would consider giving him 6 weeks of levaquin po due to "periosteal reaction". This course could be amended (lengthened or shortened) if he has f/u imaging.  Discussed with pt, Dr Eliseo Squires Will follow peripherally.          Bobby Rumpf MD, FACP Infectious Diseases (pager) [phone] [email or web address] 04/12/2017, 8:52 AM  LOS: 5 days

## 2017-04-13 DIAGNOSIS — A419 Sepsis, unspecified organism: Secondary | ICD-10-CM

## 2017-04-13 DIAGNOSIS — C799 Secondary malignant neoplasm of unspecified site: Secondary | ICD-10-CM

## 2017-04-13 MED ORDER — HYDROMORPHONE HCL 4 MG PO TABS: 4.0000 mg | ORAL_TABLET | Freq: Four times a day (QID) | ORAL | 0 refills | Status: DC | PRN

## 2017-04-13 MED ORDER — LEVOFLOXACIN 750 MG PO TABS: 750.0000 mg | ORAL_TABLET | Freq: Every day | ORAL | 0 refills | Status: AC

## 2017-04-13 NOTE — Progress Notes (Signed)
Orders received for pt discharge.  Discharge summary printed and reviewed with pt.  Explained medication regimen, and pt had no further questions at this time.  IV removed and site remains clean, dry, intact.  Telemetry removed.  Pt in stable condition and awaiting transport. 

## 2017-04-13 NOTE — Progress Notes (Signed)
Received call from [facility] and needs a F2F for [facility]. Attending paged. Jonnie Finner RN CCM Case Mgmt phone [phone]

## 2017-04-13 NOTE — Discharge Summary (Signed)
Physician Discharge Summary  Andrew Kline BPZ:[id number] DOB: [birth year] DOA: 04/07/2017  PCP: Andrew Collie, MD  Admit date: 04/07/2017 Discharge date: 04/13/2017  Admitted From: home Disposition:  Home   Recommendations for Outpatient Follow-up:  1. Follow up with PCP in 6 days as scheduled 2. Follow up with Surgery in 1 week 3. Patient discharged on 6 weeks of Levaquin per ID 4. Follow up with IR next month as scheduled  Home Health: RN Equipment/Devices: none  Discharge Condition: stable CODE STATUS: Full code Diet recommendation: regular  HPI: Per Dr. [name], Andrew Kline  is a 51 y.o. 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 The back pain has been going on for about 1.5 weeks. Pt states fever and sweats for the past 2 days.    In ED,  CT scan abd/ pelvis IMPRESSION: 1. There is now moderate to severe bilateral hydronephrosis, stable on the left and new on the right. There is an abrupt transition in both dilated ureters in the pelvis. No definitive underlying cause identified. No stone. The findings are consistent bilateral obstruction. The perinephric stranding on the left has almost completely resolved. 2. The chronic irregular thick walled fluid collection in the proctectomy bad has significantly enlarged and worsened since the comparison CT scan. The overall size has increased as described above. Extension into the adjacent pelvic musculature has also worsened. The periosteal reaction along the anterior margin of the adjacent sacrum is mildly worsened suggesting osteomyelitis. This could represent an abscess and/or recurrent tumor. 3. Interval progression of pulmonary metastases. 4. 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 Loops of small bowel within the peristomal hernia are mildly prominent measuring up to 3 cm on coronal imaging. Recommend  clinical correlation. 5. Atherosclerotic change in the aorta.   Ed consulted urology regarding bilateral hydronephrosis and they recommended [facility] , percutaneous nephrostomy.     Hospital Course: Discharge Diagnoses:  Principal Problem:   Leukocytosis Active Problems:   Hyponatremia   Hydronephrosis   Tachycardia   Sepsis (leukocytosis, fever, tachycardia) due to UTI/gluteal drainage -patient was admitted to the hospital and was started on broad spectrum antibiotics. General surgery, ID were consulted and have followed patient while hospitalized. He underwent I&D in the OR by Dr. Ninfa Linden on 12/10. Blood cultures have remained negative. Wound cultures grew Staph aureus resistant only to Erythromycin. Urine culture with multiple species: >=100,000 COLONIES/mL SERRATIA MARCESCENS  >=100,000 COLONIES/mL ALCALIGENES FAECALIS  80,000 COLONIES/mL PSEUDOMONAS AERUGINOSA  80,000 COLONIES/mL CITROBACTER FREUNDII -Discussed with ID on the day of discharge, recommends 6 weeks of po Levaquin given report of "periosteal reaction" noted on imaging  -wound care: daily wet to dry dressing changes that maybe his wife could learn to do. Outer dressing may need to be changed more often as needed depending on drainage. Bilateral hydronephrosis  -IR consulted for percutaneous nephrostomy x 2, completed with no complications on 24/2 -q6 weeks changes, already has follow up in January  Cancer related pain -resume home meds, followed by Oncology in [place]. Short course 3-4 days of po dilaudid post op otherwise his chronic pain regimen as per outpatient MD Metastatic renal and rectal cancer -with pulmonary metastases which are increasing in size per imaging. Currently no longer receiving chemotherapy and was on hospice. Apparently, he was dismissed due to drugs at his home per prior notes (refer to outpatient note on 12/5) Depression with  anxiety - continue home  medications   Discharge Instructions   Allergies as of 04/13/2017      Reactions   Oxaliplatin Itching   Fentanyl    itching      Medication List    TAKE these medications   amitriptyline 50 MG tablet Commonly known as:  ELAVIL TAKE ONE TABLET BY MOUTH AT BEDTIME.   DULoxetine 60 MG capsule Commonly known as:  CYMBALTA Take 1 capsule (60 mg total) by mouth daily.   HYDROmorphone 4 MG tablet Commonly known as:  DILAUDID Take 1 tablet (4 mg total) by mouth every 6 (six) hours as needed (breakthrough pain).   levofloxacin 750 MG tablet Commonly known as:  LEVAQUIN Take 1 tablet (750 mg total) by mouth daily.   Morphine Sulfate ER 15 MG T12a Take 15 mg by mouth See admin instructions. Take 1 tablet (15 mg) by mouth twice daily - with a 30 mg tablet for a 45 mg dose   morphine 30 MG tablet Commonly known as:  MSIR Take 30 mg by mouth 2 (two) times daily. Take 2 tablets by mouth twice a day   OXYGEN Inhale 4 L into the lungs as needed (shortness of breath).   predniSONE 20 MG tablet Commonly known as:  DELTASONE Take 20 mg by mouth daily with breakfast.   pregabalin 150 MG capsule Commonly known as:  LYRICA Take 150 mg by mouth 2 (two) times daily.   promethazine 25 MG tablet Commonly known as:  PHENERGAN Take 25 mg by mouth every 4 (four) hours as needed for nausea or vomiting.   temazepam 15 MG capsule Commonly known as:  RESTORIL Take 15 mg by mouth at bedtime.      Follow-up [facility], [place]. Schedule an appointment as soon as possible for a visit in 2 week(s).   Specialty:  General Surgery Why:  for wound check Contact information: [place].   Why:  a home health nurse will go to your home Contact information: [address] [place] [address] [phone]           Consultations:  General  surgery  IR  ID  Urology   Procedures/Studies:  I&D 12/10   Dg Chest 2 View  Result Date: 04/07/2017 CLINICAL DATA:  Back pain.  Fever, cough, and chills. EXAM: CHEST  2 VIEW COMPARISON:  03/04/2016. FINDINGS: PowerPort catheter noted looped in the right IJ with its tip tip over the upper superior vena cava. This is in a more superior location than on prior study of 03/04/2016. Heart size normal. Multifocal bilateral pulmonary infiltrates versus mass lesions. Bilateral pneumonia and/or metastatic disease could present in this fashion. These findings are new from prior exam. No pleural effusion or pneumothorax. No acute bony abnormality . IMPRESSION: 1. PowerPort catheter noted looped in the right IJ with its tip over the upper superior vena cava. 2. Multifocal bilateral pulmonary infiltrates and/or mass lesions. Findings consistent with multifocal bilateral pneumonia and/or metastatic disease. These findings are new from prior exam . Electronically Signed   By: Elbert   On: 04/07/2017 17:11   Ct Abdomen Pelvis W Contrast  Result Date: 04/07/2017 CLINICAL DATA:  History of renal carcinoma in 2012. 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 History of right partial nephrectomy. Suprapubic catheter. EXAM: CT ABDOMEN AND  PELVIS WITH CONTRAST TECHNIQUE: Multidetector CT imaging of the abdomen and pelvis was performed using the standard protocol following bolus administration of intravenous contrast. CONTRAST:  140mL ISOVUE-300 IOPAMIDOL (ISOVUE-300) INJECTION 61% COMPARISON:  January 15, 2017 FINDINGS: Lower chest: Interval progression of pulmonary metastases. A representative metastasis in the right lower lobe on series 5, image 2 measures 3.1 cm today versus 2.9 cm previously. The previously measured metastasis in the medial right lower lobe on series 5, image 6 is now confluent with adjacent nodules making measurement difficult. However, it measures 4.2 today  versus 3.6 cm previously. A lingular metastasis on image 7 measures 2.6 cm today versus 2.7 cm previously. There also appear to be a few new small metastases. No other changes in the soft tissues of the chest. Hepatobiliary: No focal liver abnormality is seen. No gallstones, gallbladder wall thickening, or biliary dilatation. Pancreas: Calcifications in the pancreas are consistent with previous pancreatitis. No peripancreatic stranding today to suggest acute pancreatitis. No pancreatic mass noted. Spleen: Normal in size without focal abnormality. Adrenals/Urinary Tract: The adrenal glands are normal. The patient is status post partial nephrectomy on the right at the lower pole. This site is unchanged with no evidence recurrence. No renal masses are seen. There is right-sided hydronephrosis which is moderate to severe. The right ureter is dilated over its proximal half. There is a discrete change in caliber in the pelvis as seen on series 2, image 65 and is not dilated beyond this point. No underlying cause identified. There is persistent moderate to severe hydronephrosis on the left and the left ureter is also dilated along the proximal 2/3 of its length. There is also an abrupt caliber change in the pelvis. The distal most aspect of the left ureter is not dilated. No ureteral stones identified. The bladder is decompressed with a suprapubic catheter which is stable. The perinephric stranding on the left has largely resolved. Delayed images demonstrate contrast pooling in the dilated renal calices. Stomach/Bowel: The stomach is normal in appearance. The small bowel is normal. The patient has a left lower quadrant ostomy with a peristomal hernia containing small bowel loops. These loops of small bowel within the peristomal hernia are borderline measuring up to 3 cm but there is no associated wall thickening. The portion of colon within the ostomy remains compressed into the right side of the ostomy sac by adjacent  small bowel loops but this is stable. The remaining colon is normal. There is no convincing evidence of appendicitis. There is a collection with adjacent stranding in the pelvis which will be described below. The stranding abuts the distal appendix. However, the adjacent appendix is nondilated. Vascular/Lymphatic: Mild atherosclerotic changes in the nonaneurysmal aorta. The iliac vessels are also involved. The lymph nodes are normal. Reproductive: The patient is status post prostatectomy. The irregular fluid collection in the prostatectomy bed remains, demonstrating a thick enhancing wall with prominent surrounding fat stranding, again intimately involved with the pelvic floor musculature and posterior bladder wall. This collection is larger in the interval and appears multiloculated. The collection measures 8.6 by 6.8 cm on series 2, image 79. At this same level of the previous study, the collection measured 5.6 x 4.0 cm. There is also increased inferior extension with loculated components of fluid to both sides of the anal canal on series 2, image 84 which are much larger. The superior extension may be slightly increased in the interval. There appears to be involvement of the adjacent pelvic musculature which is worsened. The periosteal  reaction along the anterior margin of the sacrum is probably mildly worsened in the interval. Other: No free air.  No peritoneal or omental disease identified. Musculoskeletal: Periosteal reaction along the anterior sacral margin as described above. No other bony changes. IMPRESSION: 1. There is now moderate to severe bilateral hydronephrosis, stable on the left and new on the right. There is an abrupt transition in both dilated ureters in the pelvis. No definitive underlying cause identified. No stone. The findings are consistent bilateral obstruction. The perinephric stranding on the left has almost completely resolved. 2. The chronic irregular thick walled fluid collection in  the proctectomy bad has significantly enlarged and worsened since the comparison CT scan. The overall size has increased as described above. Extension into the adjacent pelvic musculature has also worsened. The periosteal reaction along the anterior margin of the adjacent sacrum is mildly worsened suggesting osteomyelitis. This could represent an abscess and/or recurrent tumor. 3. Interval progression of pulmonary metastases. 4. 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 Loops of small bowel within the peristomal hernia are mildly prominent measuring up to 3 cm on coronal imaging. Recommend clinical correlation. 5. Atherosclerotic change in the aorta. Electronically Signed   By: Dorise Bullion III M.D   On: 04/07/2017 20:30   Ir Nephrostomy Placement Left  Result Date: 04/08/2017 INDICATION: 51 year old male with a complex history including past rectal and renal cell carcinoma. He has recurrent bilateral hydronephrosis as well as an enlarging numb malignant versus infectious process in his pelvis. He has back pain, leukocytosis and evidence of infection. Bilateral percutaneous nephrostomy tube placement is warranted for renal decompression and to prevent urosepsis. EXAM: IR NEPHROSTOMY PLACEMENT RIGHT; IR NEPHROSTOMY PLACEMENT LEFT COMPARISON:  None. MEDICATIONS: Patient currently receiving intravenous antibiotics as an inpatient. No additional antibiotics were administered. ANESTHESIA/SEDATION: Dilaudid 1 mg IV ; Versed 4 mg IV Moderate Sedation Time:  22 minutes The patient was continuously monitored during the procedure by the interventional radiology nurse under my direct supervision. CONTRAST:  20 mL Isovue 370 - administered into the collecting system(s) FLUOROSCOPY TIME:  Fluoroscopy Time: 3 minutes 30 seconds (17 mGy). COMPLICATIONS: None immediate. TECHNIQUE: The procedure, risks, benefits, and alternatives were explained to the patient. Questions regarding the procedure were  encouraged and answered. The patient understands and consents to the procedure. The left flank was prepped with chlorhexidine in a sterile fashion, and a sterile drape was applied covering the operative field. A sterile gown and sterile gloves were used for the procedure. Local anesthesia was provided with 1% Lidocaine. The left flank was interrogated with ultrasound and the left kidney identified. The kidney is hydronephrotic. A suitable access site on the skin overlying the lower pole, posterior calix was identified. After local mg anesthesia was achieved, a small skin nick was made with an 11 blade scalpel. A 21 gauge Accustick needle was then advanced under direct sonographic guidance into the lower pole of the left kidney. A 0.018 inch wire was advanced under fluoroscopic guidance into the left renal collecting system. The Accustick sheath was then advanced over the wire and a 0.018 system exchanged for a 0.035 system. Gentle hand injection of contrast material confirms placement of the sheath within the renal collecting system. There is marked hydronephrosis. The tract from the scan into the renal collecting system was then dilated serially to 10-French. A 10-French Cook all-purpose drain was then placed and positioned under fluoroscopic guidance. The locking loop is well formed within the left renal pelvis. The catheter was  secured to the skin with 2-0 Prolene and a sterile bandage was placed. Catheter was left to gravity bag drainage. The right flank was prepped with chlorhexidine in a sterile fashion, and a sterile drape was applied covering the operative field. A sterile gown and sterile gloves were used for the procedure. Local anesthesia was provided with 1% Lidocaine. The right flank was interrogated with ultrasound and the left kidney identified. The kidney is hydronephrotic. A suitable access site on the skin overlying the lower pole, posterior calix was identified. After local mg anesthesia was  achieved, a small skin nick was made with an 11 blade scalpel. A 21 gauge Accustick needle was then advanced under direct sonographic guidance into the lower pole of the right kidney. A 0.018 inch wire was advanced under fluoroscopic guidance into the left renal collecting system. The Accustick sheath was then advanced over the wire and a 0.018 system exchanged for a 0.035 system. Gentle hand injection of contrast material confirms placement of the sheath within the renal collecting system. There is marked hydronephrosis. The tract from the scan into the renal collecting system was then dilated serially to 10-French. A 10-French Cook all-purpose drain was then placed and positioned under fluoroscopic guidance. The locking loop is well formed within the left renal pelvis. The catheter was secured to the skin with 2-0 Prolene and a sterile bandage was placed. Catheter was left to gravity bag drainage. IMPRESSION: Successful placement of a bilateral 10 French percutaneous nephrostomy tubes. PLAN: Return Interventional Radiology in 8 weeks for nephrostomy tube check and change. If the patient's clinical symptoms do not improve, then the source of his infection is likely the multiloculated process in the presacral space extending into the perineum. This may represent a large complex abscess, or centrally necrotic recurrent tumor, or both (centrally necrotic tumor with superinfection of a necrotic components). CT-guided aspiration and biopsy may be required to fully differentiate the process. Signed, Criselda Peaches, MD [facility] Radiology Electronically Signed   By: Jacqulynn Cadet M.D.   On: 04/08/2017 15:21   Ir Nephrostomy Placement Right  Result Date: 04/08/2017 INDICATION: 51 year old male with a complex history including past rectal and renal cell carcinoma. He has recurrent bilateral hydronephrosis as well as an enlarging numb malignant versus infectious  process in his pelvis. He has back pain, leukocytosis and evidence of infection. Bilateral percutaneous nephrostomy tube placement is warranted for renal decompression and to prevent urosepsis. EXAM: IR NEPHROSTOMY PLACEMENT RIGHT; IR NEPHROSTOMY PLACEMENT LEFT COMPARISON:  None. MEDICATIONS: Patient currently receiving intravenous antibiotics as an inpatient. No additional antibiotics were administered. ANESTHESIA/SEDATION: Dilaudid 1 mg IV ; Versed 4 mg IV Moderate Sedation Time:  22 minutes The patient was continuously monitored during the procedure by the interventional radiology nurse under my direct supervision. CONTRAST:  20 mL Isovue 370 - administered into the collecting system(s) FLUOROSCOPY TIME:  Fluoroscopy Time: 3 minutes 30 seconds (17 mGy). COMPLICATIONS: None immediate. TECHNIQUE: The procedure, risks, benefits, and alternatives were explained to the patient. Questions regarding the procedure were encouraged and answered. The patient understands and consents to the procedure. The left flank was prepped with chlorhexidine in a sterile fashion, and a sterile drape was applied covering the operative field. A sterile gown and sterile gloves were used for the procedure. Local anesthesia was provided with 1% Lidocaine. The left flank was interrogated with ultrasound and the left kidney identified. The kidney is hydronephrotic. A suitable access site on the skin overlying the lower  pole, posterior calix was identified. After local mg anesthesia was achieved, a small skin nick was made with an 11 blade scalpel. A 21 gauge Accustick needle was then advanced under direct sonographic guidance into the lower pole of the left kidney. A 0.018 inch wire was advanced under fluoroscopic guidance into the left renal collecting system. The Accustick sheath was then advanced over the wire and a 0.018 system exchanged for a 0.035 system. Gentle hand injection of contrast material confirms placement of the sheath within  the renal collecting system. There is marked hydronephrosis. The tract from the scan into the renal collecting system was then dilated serially to 10-French. A 10-French Cook all-purpose drain was then placed and positioned under fluoroscopic guidance. The locking loop is well formed within the left renal pelvis. The catheter was secured to the skin with 2-0 Prolene and a sterile bandage was placed. Catheter was left to gravity bag drainage. The right flank was prepped with chlorhexidine in a sterile fashion, and a sterile drape was applied covering the operative field. A sterile gown and sterile gloves were used for the procedure. Local anesthesia was provided with 1% Lidocaine. The right flank was interrogated with ultrasound and the left kidney identified. The kidney is hydronephrotic. A suitable access site on the skin overlying the lower pole, posterior calix was identified. After local mg anesthesia was achieved, a small skin nick was made with an 11 blade scalpel. A 21 gauge Accustick needle was then advanced under direct sonographic guidance into the lower pole of the right kidney. A 0.018 inch wire was advanced under fluoroscopic guidance into the left renal collecting system. The Accustick sheath was then advanced over the wire and a 0.018 system exchanged for a 0.035 system. Gentle hand injection of contrast material confirms placement of the sheath within the renal collecting system. There is marked hydronephrosis. The tract from the scan into the renal collecting system was then dilated serially to 10-French. A 10-French Cook all-purpose drain was then placed and positioned under fluoroscopic guidance. The locking loop is well formed within the left renal pelvis. The catheter was secured to the skin with 2-0 Prolene and a sterile bandage was placed. Catheter was left to gravity bag drainage. IMPRESSION: Successful placement of a bilateral 10 French percutaneous nephrostomy tubes. PLAN: Return  Interventional Radiology in 8 weeks for nephrostomy tube check and change. If the patient's clinical symptoms do not improve, then the source of his infection is likely the multiloculated process in the presacral space extending into the perineum. This may represent a large complex abscess, or centrally necrotic recurrent tumor, or both (centrally necrotic tumor with superinfection of a necrotic components). CT-guided aspiration and biopsy may be required to fully differentiate the process. Signed, Criselda Peaches, MD [facility] Radiology Electronically Signed   By: Jacqulynn Cadet M.D.   On: 04/08/2017 15:21     Subjective: - no chest pain, shortness of breath, no abdominal pain, nausea or vomiting.   Discharge Exam: Vitals:   04/13/17 0625 04/13/17 1225  BP: 131/63 101/73  Pulse: 75 87  Resp: 18 18  Temp: 97.8 F (36.6 C) 98.9 F (37.2 C)  SpO2: 98% 98%    General: Pt is alert, awake, not in acute distress Cardiovascular: RRR, S1/S2 +, no rubs, no gallops Respiratory: CTA bilaterally, no wheezing, no rhonchi Abdominal: Soft, NT, ND, bowel sounds + Extremities: no edema, no cyanosis    The results of significant diagnostics from this hospitalization (  including imaging, microbiology, ancillary and laboratory) are listed below for reference.     Microbiology: Recent Results (from the past 240 hour(s))  Culture, blood (Routine x 2)     Status: None   Collection Time: 04/07/17  3:20 PM  Result Value Ref Range Status   Specimen Description   Final    LEFT ANTECUBITAL BOTTLES DRAWN AEROBIC AND ANAEROBIC   Special Requests   Final    Blood Culture results may not be optimal due to an inadequate volume of blood received in culture bottles   Culture NO GROWTH 5 DAYS  Final   Report Status 04/12/2017 FINAL  Final  Culture, blood (Routine x 2)     Status: None   Collection Time: 04/07/17  3:20 PM  Result Value Ref Range Status    Specimen Description   Final    RIGHT ANTECUBITAL BOTTLES DRAWN AEROBIC AND ANAEROBIC   Special Requests Blood Culture adequate volume  Final   Culture NO GROWTH 5 DAYS  Final   Report Status 04/12/2017 FINAL  Final  Urine culture     Status: Abnormal   Collection Time: 04/07/17  7:00 PM  Result Value Ref Range Status   Specimen Description URINE, SUPRAPUBIC  Final   Special Requests NONE  Final   Culture (A)  Final    >=100,000 COLONIES/mL SERRATIA MARCESCENS >=100,000 COLONIES/mL ALCALIGENES FAECALIS 80,000 COLONIES/mL PSEUDOMONAS AERUGINOSA 80,000 COLONIES/mL CITROBACTER FREUNDII    Report Status 04/11/2017 FINAL  Final   Organism ID, Bacteria SERRATIA MARCESCENS (A)  Final   Organism ID, Bacteria ALCALIGENES FAECALIS (A)  Final   Organism ID, Bacteria PSEUDOMONAS AERUGINOSA (A)  Final   Organism ID, Bacteria CITROBACTER FREUNDII (A)  Final      Susceptibility   Alcaligenes faecalis - MIC*    CEFEPIME 4 SENSITIVE Sensitive     CEFAZOLIN 32 INTERMEDIATE Intermediate     GENTAMICIN 2 SENSITIVE Sensitive     CIPROFLOXACIN 1 SENSITIVE Sensitive     IMIPENEM 0.5 SENSITIVE Sensitive     TRIMETH/SULFA <=20 SENSITIVE Sensitive     * >=100,000 COLONIES/mL ALCALIGENES FAECALIS   Citrobacter freundii - MIC*    CEFAZOLIN >=64 RESISTANT Resistant     CEFTRIAXONE <=1 SENSITIVE Sensitive     CIPROFLOXACIN <=0.25 SENSITIVE Sensitive     GENTAMICIN <=1 SENSITIVE Sensitive     IMIPENEM <=0.25 SENSITIVE Sensitive     NITROFURANTOIN <=16 SENSITIVE Sensitive     TRIMETH/SULFA <=20 SENSITIVE Sensitive     PIP/TAZO <=4 SENSITIVE Sensitive     * 80,000 COLONIES/mL CITROBACTER FREUNDII   Pseudomonas aeruginosa - MIC*    CEFTAZIDIME 4 SENSITIVE Sensitive     CIPROFLOXACIN <=0.25 SENSITIVE Sensitive     GENTAMICIN 4 SENSITIVE Sensitive     IMIPENEM 2 SENSITIVE Sensitive     PIP/TAZO <=4 SENSITIVE Sensitive     CEFEPIME 4 SENSITIVE Sensitive     * 80,000 COLONIES/mL PSEUDOMONAS AERUGINOSA    Serratia marcescens - MIC*    CEFAZOLIN >=64 RESISTANT Resistant     CEFTRIAXONE <=1 SENSITIVE Sensitive     CIPROFLOXACIN <=0.25 SENSITIVE Sensitive     GENTAMICIN <=1 SENSITIVE Sensitive     NITROFURANTOIN 128 RESISTANT Resistant     TRIMETH/SULFA <=20 SENSITIVE Sensitive     * >=100,000 COLONIES/mL SERRATIA MARCESCENS  Aerobic Culture (superficial specimen)     Status: None   Collection Time: 04/09/17  9:51 AM  Result Value Ref Range Status   Specimen Description ABSCESS  Final   Special  Requests NONE  Final   Gram Stain   Final    ABUNDANT WBC PRESENT, PREDOMINANTLY PMN MODERATE GRAM NEGATIVE RODS FEW GRAM POSITIVE RODS FEW GRAM POSITIVE COCCI IN PAIRS    Culture   Final    FEW STREPTOCOCCUS GROUP C RARE STAPHYLOCOCCUS AUREUS    Report Status 04/12/2017 FINAL  Final   Organism ID, Bacteria STAPHYLOCOCCUS AUREUS  Final      Susceptibility   Staphylococcus aureus - MIC*    CIPROFLOXACIN <=0.5 SENSITIVE Sensitive     ERYTHROMYCIN >=8 RESISTANT Resistant     GENTAMICIN <=0.5 SENSITIVE Sensitive     OXACILLIN 0.5 SENSITIVE Sensitive     TETRACYCLINE <=1 SENSITIVE Sensitive     VANCOMYCIN <=0.5 SENSITIVE Sensitive     TRIMETH/SULFA <=10 SENSITIVE Sensitive     CLINDAMYCIN <=0.25 SENSITIVE Sensitive     RIFAMPIN <=0.5 SENSITIVE Sensitive     Inducible Clindamycin NEGATIVE Sensitive     * RARE STAPHYLOCOCCUS AUREUS  Culture, fungus without smear     Status: None (Preliminary result)   Collection Time: 04/09/17  9:52 AM  Result Value Ref Range Status   Specimen Description WOUND  Final   Special Requests Immunocompromised  Final   Culture NO FUNGUS ISOLATED AFTER 3 DAYS  Final   Report Status PENDING  Incomplete  Surgical pcr screen     Status: Abnormal   Collection Time: 04/09/17 11:50 PM  Result Value Ref Range Status   MRSA, PCR NEGATIVE NEGATIVE Final   Staphylococcus aureus POSITIVE (A) NEGATIVE Final    Comment: RESULT CALLED TO, READ BACK BY AND VERIFIED  WITH: RNCharlann Boxer [place] [id number] @[id number]  THANEY (NOTE) The Xpert SA Assay (FDA approved for NASAL specimens in patients 16 years of age and older), is one component of a comprehensive surveillance program. It is not intended to diagnose infection nor to guide or monitor treatment.   Aerobic/Anaerobic Culture (surgical/deep wound)     Status: None (Preliminary result)   Collection Time: 04/10/17  9:41 AM  Result Value Ref Range Status   Specimen Description ABSCESS BUTTOCKS LEFT  Final   Special Requests NONE  Final   Gram Stain   Final    FEW WBC PRESENT, PREDOMINANTLY PMN RARE SQUAMOUS EPITHELIAL CELLS PRESENT RARE GRAM POSITIVE RODS RARE GRAM POSITIVE COCCI IN PAIRS    Culture   Final    RARE STREPTOCOCCUS GROUP C RARE STAPHYLOCOCCUS AUREUS NO ANAEROBES ISOLATED; CULTURE IN PROGRESS FOR 5 DAYS    Report Status PENDING  Incomplete   Organism ID, Bacteria STAPHYLOCOCCUS AUREUS  Final      Susceptibility   Staphylococcus aureus - MIC*    CIPROFLOXACIN <=0.5 SENSITIVE Sensitive     ERYTHROMYCIN >=8 RESISTANT Resistant     GENTAMICIN <=0.5 SENSITIVE Sensitive     OXACILLIN 0.5 SENSITIVE Sensitive     TETRACYCLINE <=1 SENSITIVE Sensitive     VANCOMYCIN <=0.5 SENSITIVE Sensitive     TRIMETH/SULFA <=10 SENSITIVE Sensitive     CLINDAMYCIN <=0.25 SENSITIVE Sensitive     RIFAMPIN <=0.5 SENSITIVE Sensitive     Inducible Clindamycin NEGATIVE Sensitive     * RARE STAPHYLOCOCCUS AUREUS     Labs: BNP (last 3 results) No results for input(s): BNP in the last 8760 hours. Basic Metabolic Panel: Recent Labs  Lab 04/07/17 1520 04/09/17 1009 04/10/17 0428  NA 132* 129* 134*  K 4.2 4.2 4.6  CL 95* 97* 97*  CO2 26 22 27   GLUCOSE 180* 255* 114*  BUN 7 10  10  CREATININE 0.92 1.17 1.12  CALCIUM 9.2 8.1* 8.6*   Liver Function Tests: Recent Labs  Lab 04/07/17 1520  AST 23  ALT 47  ALKPHOS 308*  BILITOT 0.4  PROT 8.4*  ALBUMIN 3.0*   No results for input(s): LIPASE, AMYLASE  in the last 168 hours. No results for input(s): AMMONIA in the last 168 hours. CBC: Recent Labs  Lab 04/07/17 1520 04/09/17 1009 04/10/17 0428  WBC 17.2* 12.2* 10.5  NEUTROABS 14.0* 9.3* 7.6  HGB 13.1 11.5* 11.9*  HCT 41.8 35.8* 37.3*  MCV 86.0 84.2 86.1  PLT 593* 514* 522*   Cardiac Enzymes: Recent Labs  Lab 04/07/17 2330  TROPONINI <0.03   BNP: Invalid input(s): POCBNP CBG: No results for input(s): GLUCAP in the last 168 hours. D-Dimer No results for input(s): DDIMER in the last 72 hours. Hgb A1c No results for input(s): HGBA1C in the last 72 hours. Lipid Profile No results for input(s): CHOL, HDL, LDLCALC, TRIG, CHOLHDL, LDLDIRECT in the last 72 hours. Thyroid function studies No results for input(s): TSH, T4TOTAL, T3FREE, THYROIDAB in the last 72 hours.  Invalid input(s): FREET3 Anemia work up No results for input(s): VITAMINB12, FOLATE, FERRITIN, TIBC, IRON, RETICCTPCT in the last 72 hours. Urinalysis    Component Value Date/Time   COLORURINE YELLOW 04/07/2017 1542   APPEARANCEUR HAZY (A) 04/07/2017 1542   LABSPEC 1.008 04/07/2017 1542   PHURINE 8.0 04/07/2017 1542   GLUCOSEU NEGATIVE 04/07/2017 1542   HGBUR SMALL (A) 04/07/2017 1542   BILIRUBINUR NEGATIVE 04/07/2017 1542   KETONESUR NEGATIVE 04/07/2017 1542   PROTEINUR 100 (A) 04/07/2017 1542   UROBILINOGEN 0.2 03/17/2015 0910   NITRITE NEGATIVE 04/07/2017 1542   LEUKOCYTESUR LARGE (A) 04/07/2017 1542   Sepsis Labs Invalid input(s): PROCALCITONIN,  WBC,  LACTICIDVEN   Time coordinating discharge: 45 minutes  SIGNED:  Marzetta Board, MD  [facility] 04/13/2017, 3:41 PM Pager [phone]  If 7PM-7AM, please contact night-coverage [email or web address] Password [email or web address]

## 2017-04-13 NOTE — Clinical Social Work Note (Signed)
Patient's wife's car is stuck in the snow in mud and no one is able to help her pull it out until 6 or 7:00 tonight at the earliest. Patient states he cannot afford to pay for a cab out of pocket and has no other supports that could pick him up. Cab voucher to patient's home in [place] approved by [employer] of social work department. MD, RN, and RNCM aware. Cab voucher filled out and given to Retail banker.  CSW signing off.   Andrew Kline, [place]

## 2017-04-13 NOTE — Progress Notes (Signed)
Dressing change completed per order. Patient tolerated well. No odor or drainage noted.

## 2017-04-13 NOTE — Care Management Note (Addendum)
Case Management Note Previous Note Created by Olga Coaster  Patient Details  Name: Andrew Kline MRN: [id number] Date of Birth: 1965/09/15  Subjective/Objective:    Leukocytosis              Action/Plan: Patient lives at home with spouse; PCP: Marco Collie, MD; CM talked to patient about [place] choices, pt chose [facility]; Don with [facility] called for arrangements.  Expected Discharge Date:  04/12/2017             Expected Discharge Plan:  [place]  Discharge planning Services  CM Consult, [facility]  HH Arranged:  RN [facility] Agency:  [place]  Status of Service:  In process, will continue to follow  Karlyne Greenspan [phone] 04/13/2017, 2:07 PM Pt to discharge home today in care of wife.  Pt in agreement for [facility] appt - appt made for [facility] 12/19 - information provided on AVS.  [place] administrator performed override for pt to both receive Match and to not pay required copay with program (pt received MATCH this calendar year but states he can not afford to pay anything for his medications).  Pt will discharge home on new PO antibiotic.  CM provided [place] letter and informed pt that if he does not go to [facility] to get MATCH meds filled - he will be unable to get all discharge medications minus pains meds filled due to cost maximum - pt acknowledged information.  CM also requested pt to discuss medication assistance at initial appt with [facility].  Pt informed CM that he has not been on oxygen since the summer of 2018 - all equipment has been sent back to [place] - attending made aware.  Pt is currently on room air.  CM confirmed with [facility] that [facility] referral accepted - agency informed of discharge today

## 2017-04-15 LAB — AEROBIC/ANAEROBIC CULTURE (SURGICAL/DEEP WOUND)

## 2017-04-15 LAB — AEROBIC/ANAEROBIC CULTURE W GRAM STAIN (SURGICAL/DEEP WOUND)

## 2017-04-19 ENCOUNTER — Encounter: Payer: Self-pay | Admitting: Family Medicine

## 2017-04-19 ENCOUNTER — Ambulatory Visit (INDEPENDENT_AMBULATORY_CARE_PROVIDER_SITE_OTHER): Payer: Medicare Other | Admitting: Family Medicine

## 2017-04-19 VITALS — BP 102/68 | HR 103 | Temp 98.7°F | Resp 14 | Ht 65.0 in | Wt 190.0 lb

## 2017-04-19 DIAGNOSIS — D72829 Elevated white blood cell count, unspecified: Secondary | ICD-10-CM | POA: Diagnosis not present

## 2017-04-19 DIAGNOSIS — G4700 Insomnia, unspecified: Secondary | ICD-10-CM

## 2017-04-19 DIAGNOSIS — G8929 Other chronic pain: Secondary | ICD-10-CM

## 2017-04-19 DIAGNOSIS — L0231 Cutaneous abscess of buttock: Secondary | ICD-10-CM

## 2017-04-19 DIAGNOSIS — C7800 Secondary malignant neoplasm of unspecified lung: Secondary | ICD-10-CM | POA: Diagnosis not present

## 2017-04-19 DIAGNOSIS — C649 Malignant neoplasm of unspecified kidney, except renal pelvis: Secondary | ICD-10-CM | POA: Diagnosis not present

## 2017-04-19 DIAGNOSIS — C20 Malignant neoplasm of rectum: Secondary | ICD-10-CM | POA: Diagnosis not present

## 2017-04-19 MED ORDER — LEVOFLOXACIN 750 MG PO TABS: 750.0000 mg | ORAL_TABLET | Freq: Every day | ORAL | 0 refills | Status: DC

## 2017-04-19 MED ORDER — TEMAZEPAM 15 MG PO CAPS
15.0000 mg | ORAL_CAPSULE | Freq: Every day | ORAL | 0 refills | Status: AC
Start: 2017-04-19 — End: ?

## 2017-04-19 MED ORDER — PREGABALIN 150 MG PO CAPS: 150.0000 mg | ORAL_CAPSULE | Freq: Two times a day (BID) | ORAL | 0 refills | Status: DC

## 2017-04-19 MED ORDER — HYDROMORPHONE HCL 4 MG PO TABS: 4.0000 mg | ORAL_TABLET | Freq: Four times a day (QID) | ORAL | 0 refills | Status: AC | PRN

## 2017-04-19 MED FILL — levoFLOXacin 750 MG TABS: 750 | 27 days supply | Qty: 27 | Fill #0

## 2017-04-19 NOTE — Progress Notes (Addendum)
Andrew Dew, FNP  [address] [place] [address]  Subjective:    Patient ID: Andrew Kline, male    DOB: Apr 04, 1966, 50 y.o.   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 Patient was admitted to inpatient services from 04/07/2017-04/13/2017 with sepsis related to UTI and gluteal drainage. Patient was admitted and started on broad spectrum antibiotics. Patient underwent an I&D in the OR on 04/10/2017. Wound culture yielded multiple bacterial species and infectious disease was consulted. Patient was discharged with 6 weeks of po Levaquin. Patient says that pharmacy would only give him 15 tablets due to insurance constraints and is requesting a prescription for Levaquin. Patient is followed by [facility] for dressing changes. He states that drainage has improved since hospital discharge.  Patient has had multiple surgeries as a result of rectal cancer. He has a left lower quandrant colostomy that he self manages. Patient also has bilateral nephrostomy tubes that were placed on 03/18/2016. Patient also has pulmonary metastasis, which are increasing in size. Patient is no longer receiving chemotherapy and has a poor prognosis. Patient continues to smoke up to 1 pack per day.  Patient continues to have chronic pain related to wide spread cancer. Patient was previously under the care of [facility]. Patient was dismissed from [facility] on 03/14/2017. Patient was discharged from inpatient services with 4 days of po dilaudid 4 mg every 6 hours as needed. Patient says, "that only lasted 3 days". He says that pain is 7/10 and is described as constant aching. Patient says that he was discharged from [place] and is out of home medications. He  says, "I have not slept in 3 days". He typically takes Restoril for sleep and is out of medication.  Past Medical History:  Diagnosis Date  . Anxiety   . Arthritis   . Depression   . Hospice care   . Kidney stone 03/22/15   left  . Rectal cancer ([place])    type 4 rectal cancer with metasis to lungs  . Rectal pain   . Renal cell cancer ([place])    2012.   [name] Shortness of breath dyspnea   . Suprapubic catheter ([place])   . Ventral hernia    around ostomy site   Social History   Socioeconomic History  . Marital status: Married    Spouse name: Not on file  . Number of children: 2  . Years of education: Not on file  . Highest education level: Not on file  Social Needs  . Financial resource strain: Not on file  . Food insecurity - worry: Not on file  . Food insecurity - inability: Not on file  . Transportation needs - medical: Not on file  . Transportation needs - non-medical: Not on file  Occupational History  . Occupation: disabled  Tobacco Use  . Smoking status: Current Every Day Smoker    Packs/day: 0.50    Years: 30.00    Pack years: 15.00    Types: Cigarettes  . Smokeless tobacco: Never Used  Substance and Sexual Activity  . Alcohol use: Yes    Alcohol/week: 0.0 oz    Comment: Occ  . Drug use: No  . Sexual activity: Yes    Birth control/protection: None  Other Topics Concern  . Not on file  Social History Narrative  . Not on file   There is no immunization history on file for this patient. Review of Systems  Constitutional: Positive for fatigue.  HENT: Negative.   Cardiovascular: Negative.   Gastrointestinal:       Left lower quadrant colostomy  Endocrine: Negative for polydipsia, polyphagia and polyuria.  Genitourinary:       Bilateral nephrectomy  Musculoskeletal: Positive for back pain.  Skin: Positive for wound.       Wound to left buttocks  Allergic/Immunologic: Positive for immunocompromised state (Rectal carcinoma with metastasis).  Neurological:  Negative.   Hematological: Negative.   Psychiatric/Behavioral: Positive for agitation and sleep disturbance.       Objective:       BP 102/68   Pulse (!) 103   Temp 98.7 F (37.1 C) (Oral)   Resp 14   Ht 5\' 5"  (1.651 m)   Wt 190 lb (86.2 kg)   SpO2 98%   BMI 31.62 kg/m   Assessment & Plan:  Abscess of buttock Patient to continue Levoquin for 6 weeks per infectious disease. Will send Levofloxacin to [facility]. Patient to continue dressing changes per [facility] daily or more depending on drainage. Wet to dry dressings recommended per wound care.  - levofloxacin (LEVAQUIN) 750 MG tablet; Take 1 tablet (750 mg total) by mouth daily.  Dispense: 27 tablet; Refill: 0  Leukocytosis, unspecified type Will review WBC, previous 17.2.  - CBC with Differential  Rectal cancer metastasized to lung [facility]) Patient is no longer on chemotherapy and has a poor prognosis. Patient states, "I won't be here next Christmas, what can I do". Patient is requesting home medications that were previously managed by [facility]. I called and spoke with representative, who states that patient is been dismissed and they will no longer prescribe patient's medications. Patient became upset and only wants to talk about his dogs and his previous hospice nurse. Patient redirected and we discussed the importance of consistent comfort care and that it is important to ensure that workers coming into his home feel safe. He expressed understanding.  Patient warrants a new referral to hospice to determine if continuing services is appropriate.  - Ambulatory referral to Hospice   Carcinoma of kidney, unspecified laterality ([place]) - Ambulatory referral to Hospice  Other chronic pain Will continue patient's Lyrica 150 mg twice daily for chronic pain. Also, Dilaudid 4 mg every 6 hours to be used prior to dressing changes. Sent and urgent referral to re-establish with hospice services in order  to receive appropriate pain management and end of life care. Explained to patient that I will not be able to manage chronic pain in primary care.   Reviewed [place] Substance Reporting system prior to prescribing opiate medications. No inconsistencies noted.    - pregabalin (LYRICA) 150 MG capsule; Take 1 capsule (150 mg total) by mouth 2 (two) times daily.  Dispense: 60 capsule; Refill: 0 - HYDROmorphone (DILAUDID) 4 MG tablet; Take 1 tablet (4 mg total) by mouth every 6 (six) hours as needed (breakthrough pain).  Dispense: 15 tablet; Refill: 0  Insomnia, unspecified type Patient says that he has not slept in 3 days, will continue temazepam 15 mg at bedtime.  - temazepam (RESTORIL) 15 MG capsule; Take 1 capsule (15 mg total) by mouth at bedtime.  Dispense: 30 capsule; Refill: 0  Tobacco dependence Patient says, "Its no need to talk about my cigarettes,  I'm dying". Patient has lung metastasis that makes it difficult to breath. Discussed that decreasing smoking may make breathing less difficult.    RTC: 3 months or as needed  Greater than 50 % of appointment spent calling hospice services and counseling patient.  Donia Pounds  MSN, FNP-C [facility] [address] [place], [place] [address] [phone]   Supervising physician: Dr. Ned Clines

## 2017-04-19 NOTE — Patient Instructions (Addendum)
Will send referral to [facility]  Will send temporary prescription for dilaudid 4 mg every 6 hours for moderate to severe pain.  Will continue Lyrica 150 mg every 12 hours as previously prescribed Will also continue Restoril 15 mg daily at bedtime.    Will continue Levoquin 750 mg daily for 6 weeks per infectious disease guidelines.  End-of-Life Care What is end-of-life care? End-of-life care is the physical, emotional, mental, and spiritual care you receive during the days, weeks, or months while you are dying. Your end-of-life care team may include:  Health care providers.  A Education officer, museum.  A spiritual adviser.  The goal of end-of-life care is to give you the highest quality of life possible at the end of your life. What are the different types of end-of-life care? There are several different kinds of care options. Palliative care This type of care does not treat or cure a disease. The goal is to manage your symptoms. These may include:  Pain.  Constipation.  Nausea.  Palliative care teams often also include support for family members and loved ones. You might need palliative care for months or years. Hospice care This is a kind of palliative care ordered and provided by your health care providers. A hospice care team can also help and support your loved ones. Comfort care This type of care is for meeting your basic needs and maintaining your overall comfort at the end of your life. This includes caring for your:  Skin.  Breathing.  Nutrition.  Rest.  Temperature.  A plan for comfort care can also address the mental, emotional, and spiritual issues that may come up at the end of your life. Where does end-of-life care take place? End-of-life care can take place wherever you are living, as long as you get the care you need. End-of-life care can happen:  At your home.  In a nursing home.  In a hospital.  In a critical care  unit.  You and your loved ones might be able to decide where end-of-life care takes place. This decision depends on:  Your comfort.  Your wishes.  The medical equipment you need.  How do I know when it is time for end-of-life care? Your health care provider might tell you that there are no more treatments left to try or that treatment can no longer control your illness. Or, you may decide that you do not want to undergo the treatments that are available. Talk to your health care provider and your loved ones about your end-of-life care options. If possible, have this conversation before you need this type of care. Discuss:  How much medical treatment you want during end-of-life care.  Where you would like to live while you are dying.  What kinds of treatments you would accept to keep you comfortable.  Which treatments you would refuse.  Your faith or spiritual needs at the end of your life.  Who will handle practical details, such as wills and finances.  You can create legal documents (advance directives) to let your loved ones know your wishes for end-of-life care. Talk to your health care provider or a lawyer about making a living will that explains your medical wishes. You can also have a medical power of attorney. This designates a person to make health decisions for you if you cannot make them yourself. This information is not intended to replace advice given to you by your health care provider. Make sure you discuss any questions  you have with your health care provider. Document Released: 11/13/2013 Document Revised: 04/01/2016 Document Reviewed: 07/12/2013 Elsevier Interactive Patient Education  2017 [employer].

## 2017-04-20 LAB — CMP AND LIVER
ALK PHOS: 164 IU/L — AB (ref 39–117)
ALT: 18 IU/L (ref 0–44)
AST: 14 IU/L (ref 0–40)
Albumin: 3.6 g/dL (ref 3.5–5.5)
BUN: 11 mg/dL (ref 6–24)
Bilirubin Total: <0.2 mg/dL (ref 0.0–1.2)
Bilirubin, Direct: 0.07 mg/dL (ref 0.00–0.40)
CO2: 22 mmol/L (ref 20–29)
CREATININE: 0.95 mg/dL (ref 0.76–1.27)
Calcium: 9.4 mg/dL (ref 8.7–10.2)
Chloride: 101 mmol/L (ref 96–106)
GFR calc Af Amer: 105 mL/min/{1.73_m2} (ref >59)
GFR calc non Af Amer: 91 mL/min/{1.73_m2} (ref >59)
Glucose: 113 mg/dL — ABNORMAL HIGH (ref 65–99)
Potassium: 4.6 mmol/L (ref 3.5–5.2)
Sodium: 138 mmol/L (ref 134–144)
TOTAL PROTEIN: 7.2 g/dL (ref 6.0–8.5)

## 2017-04-20 LAB — CBC WITH DIFFERENTIAL/PLATELET
BASOS: 0 %
Basophils Absolute: 0 10*3/uL (ref 0.0–0.2)
EOS (ABSOLUTE): 0.2 10*3/uL (ref 0.0–0.4)
Eos: 1 %
Hematocrit: 40.6 % (ref 37.5–51.0)
Hemoglobin: 13 g/dL (ref 13.0–17.7)
IMMATURE GRANULOCYTES: 1 %
Immature Grans (Abs): 0.2 10*3/uL — ABNORMAL HIGH (ref 0.0–0.1)
LYMPHS ABS: 1.7 10*3/uL (ref 0.7–3.1)
Lymphs: 11 %
MCH: 27 pg (ref 26.6–33.0)
MCHC: 32 g/dL (ref 31.5–35.7)
MCV: 84 fL (ref 79–97)
MONOS ABS: 1.3 10*3/uL — AB (ref 0.1–0.9)
Monocytes: 9 %
NEUTROS PCT: 78 %
Neutrophils Absolute: 11.7 10*3/uL — ABNORMAL HIGH (ref 1.4–7.0)
PLATELETS: 547 10*3/uL — AB (ref 150–379)
RBC: 4.81 x10E6/uL (ref 4.14–5.80)
RDW: 17.3 % — AB (ref 12.3–15.4)
WBC: 15.2 10*3/uL — ABNORMAL HIGH (ref 3.4–10.8)

## 2017-04-30 LAB — CULTURE, FUNGUS WITHOUT SMEAR

## 2017-05-05 ENCOUNTER — Telehealth: Payer: Self-pay | Admitting: Family Medicine

## 2017-05-05 NOTE — Telephone Encounter (Signed)
[place], Received a call from Toribio Harbour at [facility] regarding patient's referral to Hospice. The referring provider is listed as Dr. Marco Collie on the office visit note  and they attempted to reach out to Dr. Nyra Capes and she has no record of previously caring for patient, and per notes, patient only recently established with you.  From reviewing the referral, it appears at some point Dr. Nyra Capes was listed as a primary care provider for patient and per Epic is still listed as the referring provider and this needs to be corrected. I will follow-up with Ms. Simpson to advise her that you will be returning on Monday and I will leave the fax for you to review and address, upon your return.   Carroll Sage. Kenton Kingfisher, MSN, FNP-C [facility]  [address] [place], [place] [address] [phone]

## 2017-05-22 ENCOUNTER — Other Ambulatory Visit (HOSPITAL_COMMUNITY)

## 2017-07-04 ENCOUNTER — Ambulatory Visit (INDEPENDENT_AMBULATORY_CARE_PROVIDER_SITE_OTHER): Admitting: Urology

## 2017-07-04 DIAGNOSIS — N1330 Unspecified hydronephrosis: Secondary | ICD-10-CM | POA: Diagnosis not present

## 2017-07-04 DIAGNOSIS — N360 Urethral fistula: Secondary | ICD-10-CM

## 2017-07-19 ENCOUNTER — Ambulatory Visit: Admitting: Family Medicine

## 2017-07-26 ENCOUNTER — Other Ambulatory Visit: Payer: Self-pay | Admitting: Urology

## 2017-07-26 DIAGNOSIS — N1330 Unspecified hydronephrosis: Secondary | ICD-10-CM

## 2017-08-08 ENCOUNTER — Ambulatory Visit (HOSPITAL_COMMUNITY)
Admission: RE | Admit: 2017-08-08 | Discharge: 2017-08-08 | Disposition: A | Discharge status: Home / Self Care | Source: Ambulatory Visit | Attending: Urology | Admitting: Urology

## 2017-08-08 ENCOUNTER — Other Ambulatory Visit: Payer: Self-pay | Admitting: Urology

## 2017-08-08 ENCOUNTER — Encounter (HOSPITAL_COMMUNITY): Payer: Self-pay | Admitting: Interventional Radiology

## 2017-08-08 ENCOUNTER — Ambulatory Visit (HOSPITAL_COMMUNITY)

## 2017-08-08 DIAGNOSIS — C189 Malignant neoplasm of colon, unspecified: Secondary | ICD-10-CM | POA: Diagnosis not present

## 2017-08-08 DIAGNOSIS — C799 Secondary malignant neoplasm of unspecified site: Secondary | ICD-10-CM | POA: Diagnosis not present

## 2017-08-08 DIAGNOSIS — N1330 Unspecified hydronephrosis: Secondary | ICD-10-CM

## 2017-08-08 DIAGNOSIS — Z436 Encounter for attention to other artificial openings of urinary tract: Secondary | ICD-10-CM | POA: Insufficient documentation

## 2017-08-08 HISTORY — PX: IR NEPHROSTOGRAM LEFT THRU EXISTING ACCESS: IMG6061

## 2017-08-08 HISTORY — PX: IR NEPHROSTOGRAM RIGHT THRU EXISTING ACCESS: IMG6062

## 2017-08-08 HISTORY — PX: IR NEPHRO TUBE REMOV/FL: IMG2342

## 2017-08-08 MED ORDER — IOPAMIDOL (ISOVUE-300) INJECTION 61%
INTRAVENOUS | Status: AC
  Administered 2017-08-08: 50 mL
  Filled 2017-08-08: qty 50

## 2017-08-08 NOTE — Procedures (Signed)
Bilateral hydro  Met colon ca  Pelvic abscess  S/p bilateral nephrostograms and pcn removals  No comp Stable EBL 0 Full report in pacs

## 2017-08-09 ENCOUNTER — Encounter (HOSPITAL_COMMUNITY): Payer: Self-pay | Admitting: Interventional Radiology

## 2017-08-09 ENCOUNTER — Other Ambulatory Visit: Payer: Self-pay | Admitting: Urology

## 2017-08-09 DIAGNOSIS — N1330 Unspecified hydronephrosis: Secondary | ICD-10-CM

## 2017-10-25 ENCOUNTER — Other Ambulatory Visit: Payer: Self-pay | Admitting: Otolaryngology

## 2017-10-27 ENCOUNTER — Other Ambulatory Visit: Payer: Self-pay

## 2017-10-27 ENCOUNTER — Encounter (HOSPITAL_COMMUNITY): Payer: Self-pay | Admitting: *Deleted

## 2017-10-27 NOTE — Progress Notes (Signed)
Pt denies SOB, chest pain, and being under the care of a cardiologist. Pt denies having a stress test, echo and cardiac cath. Pt denies having an EKG within the last year. Pt denies recent labs. Pt stated that he does not take Aspirin. Pt made aware to stop taking vitamins, fish oil and herbal medications. Do not take any NSAIDs ie: Ibuprofen, Advil, Naproxen (Aleve), Motrin, BC and Goody Powder. Pt verbalized understanding of all pre-op instructions.

## 2017-10-30 ENCOUNTER — Ambulatory Visit (HOSPITAL_COMMUNITY)
Admission: RE | Admit: 2017-10-30 | Discharge: 2017-10-30 | Disposition: A | Discharge status: Home / Self Care | Source: Ambulatory Visit | Attending: Otolaryngology | Admitting: Otolaryngology

## 2017-10-30 ENCOUNTER — Ambulatory Visit (HOSPITAL_COMMUNITY): Admitting: Anesthesiology

## 2017-10-30 ENCOUNTER — Encounter (HOSPITAL_COMMUNITY): Payer: Self-pay | Admitting: *Deleted

## 2017-10-30 ENCOUNTER — Encounter (HOSPITAL_COMMUNITY)
Admission: RE | Disposition: A | Discharge status: Home / Self Care | Payer: Self-pay | Source: Ambulatory Visit | Attending: Otolaryngology

## 2017-10-30 DIAGNOSIS — C7800 Secondary malignant neoplasm of unspecified lung: Secondary | ICD-10-CM | POA: Diagnosis not present

## 2017-10-30 DIAGNOSIS — C20 Malignant neoplasm of rectum: Secondary | ICD-10-CM | POA: Diagnosis not present

## 2017-10-30 DIAGNOSIS — F329 Major depressive disorder, single episode, unspecified: Secondary | ICD-10-CM | POA: Diagnosis not present

## 2017-10-30 DIAGNOSIS — J3801 Paralysis of vocal cords and larynx, unilateral: Secondary | ICD-10-CM | POA: Diagnosis present

## 2017-10-30 DIAGNOSIS — M1990 Unspecified osteoarthritis, unspecified site: Secondary | ICD-10-CM | POA: Diagnosis not present

## 2017-10-30 DIAGNOSIS — F419 Anxiety disorder, unspecified: Secondary | ICD-10-CM | POA: Diagnosis not present

## 2017-10-30 DIAGNOSIS — F17210 Nicotine dependence, cigarettes, uncomplicated: Secondary | ICD-10-CM | POA: Diagnosis not present

## 2017-10-30 HISTORY — PX: MICROLARYNGOSCOPY: SHX5208

## 2017-10-30 HISTORY — DX: Presence of spectacles and contact lenses: Z97.3

## 2017-10-30 HISTORY — DX: Presence of dental prosthetic device (complete) (partial): Z97.2

## 2017-10-30 LAB — CBC
HCT: 49.4 % (ref 39.0–52.0)
Hemoglobin: 15.7 g/dL (ref 13.0–17.0)
MCH: 25.9 pg — AB (ref 26.0–34.0)
MCHC: 31.8 g/dL (ref 30.0–36.0)
MCV: 81.5 fL (ref 78.0–100.0)
PLATELETS: 430 10*3/uL — AB (ref 150–400)
RBC: 6.06 MIL/uL — ABNORMAL HIGH (ref 4.22–5.81)
RDW: 19 % — AB (ref 11.5–15.5)
WBC: 17.2 10*3/uL — ABNORMAL HIGH (ref 4.0–10.5)

## 2017-10-30 LAB — BASIC METABOLIC PANEL
Anion gap: 13 (ref 5–15)
BUN: 13 mg/dL (ref 6–20)
CO2: 24 mmol/L (ref 22–32)
CREATININE: 0.88 mg/dL (ref 0.61–1.24)
Calcium: 9.4 mg/dL (ref 8.9–10.3)
Chloride: 100 mmol/L (ref 98–111)
GFR calc Af Amer: >60 mL/min (ref >60)
GLUCOSE: 189 mg/dL — AB (ref 70–99)
POTASSIUM: 3.8 mmol/L (ref 3.5–5.1)
SODIUM: 137 mmol/L (ref 135–145)

## 2017-10-30 SURGERY — MICROLARYNGOSCOPY
Anesthesia: General | Site: Throat

## 2017-10-30 MED ORDER — PROPOFOL 10 MG/ML IV BOLUS
INTRAVENOUS | Status: DC | PRN
  Administered 2017-10-30: 150 mg via INTRAVENOUS

## 2017-10-30 MED ORDER — ROCURONIUM BROMIDE 100 MG/10ML IV SOLN
INTRAVENOUS | Status: DC | PRN
  Administered 2017-10-30: 50 mg via INTRAVENOUS

## 2017-10-30 MED ORDER — FENTANYL CITRATE (PF) 100 MCG/2ML IJ SOLN
INTRAMUSCULAR | Status: DC | PRN
  Administered 2017-10-30: 50 ug via INTRAVENOUS

## 2017-10-30 MED ORDER — ONDANSETRON HCL 4 MG/2ML IJ SOLN
INTRAMUSCULAR | Status: DC | PRN
  Administered 2017-10-30: 4 mg via INTRAVENOUS

## 2017-10-30 MED ORDER — PROPOFOL 500 MG/50ML IV EMUL
INTRAVENOUS | Status: DC | PRN
  Administered 2017-10-30: 125 ug/kg/min via INTRAVENOUS

## 2017-10-30 MED ORDER — DEXAMETHASONE SODIUM PHOSPHATE 10 MG/ML IJ SOLN
INTRAMUSCULAR | Status: DC | PRN
  Administered 2017-10-30: 10 mg via INTRAVENOUS

## 2017-10-30 MED ORDER — MIDAZOLAM HCL 2 MG/2ML IJ SOLN
INTRAMUSCULAR | Status: AC
  Filled 2017-10-30: qty 2

## 2017-10-30 MED ORDER — EPINEPHRINE HCL (NASAL) 0.1 % NA SOLN
NASAL | Status: DC | PRN
  Administered 2017-10-30: 30 mL via NASAL

## 2017-10-30 MED ORDER — METOCLOPRAMIDE HCL 5 MG/ML IJ SOLN
10.0000 mg | Freq: Once | INTRAMUSCULAR | Status: AC
  Administered 2017-10-30: 10 mg via INTRAVENOUS

## 2017-10-30 MED ORDER — HYDRALAZINE HCL 20 MG/ML IJ SOLN
10.0000 mg | Freq: Once | INTRAMUSCULAR | Status: AC
  Administered 2017-10-30: 10 mg via INTRAVENOUS

## 2017-10-30 MED ORDER — FENTANYL CITRATE (PF) 100 MCG/2ML IJ SOLN: 25.0000 ug | INTRAMUSCULAR | Status: DC | PRN

## 2017-10-30 MED ORDER — PROPOFOL 10 MG/ML IV BOLUS
INTRAVENOUS | Status: AC
Start: 2017-10-30 — End: ?
  Filled 2017-10-30: qty 20

## 2017-10-30 MED ORDER — LIDOCAINE HCL (CARDIAC) PF 100 MG/5ML IV SOSY
PREFILLED_SYRINGE | INTRAVENOUS | Status: DC | PRN
  Administered 2017-10-30: 60 mg via INTRAVENOUS

## 2017-10-30 MED ORDER — EPINEPHRINE HCL (NASAL) 0.1 % NA SOLN
NASAL | Status: AC
  Filled 2017-10-30: qty 30

## 2017-10-30 MED ORDER — METOCLOPRAMIDE HCL 5 MG/ML IJ SOLN
INTRAMUSCULAR | Status: AC
  Administered 2017-10-30: 10 mg via INTRAVENOUS
  Filled 2017-10-30: qty 2

## 2017-10-30 MED ORDER — ONDANSETRON HCL 4 MG/2ML IJ SOLN
4.0000 mg | Freq: Once | INTRAMUSCULAR | Status: AC
  Administered 2017-10-30: 4 mg via INTRAVENOUS

## 2017-10-30 MED ORDER — OXYMETAZOLINE HCL 0.05 % NA SOLN
NASAL | Status: DC | PRN
  Administered 2017-10-30: 1 via TOPICAL

## 2017-10-30 MED ORDER — ONDANSETRON HCL 4 MG/2ML IJ SOLN
INTRAMUSCULAR | Status: AC
  Administered 2017-10-30: 4 mg via INTRAVENOUS
  Filled 2017-10-30: qty 2

## 2017-10-30 MED ORDER — LACTATED RINGERS IV SOLN
INTRAVENOUS | Status: DC | PRN
  Administered 2017-10-30: 10:00:00 via INTRAVENOUS

## 2017-10-30 MED ORDER — FENTANYL CITRATE (PF) 250 MCG/5ML IJ SOLN
INTRAMUSCULAR | Status: AC
  Filled 2017-10-30: qty 5

## 2017-10-30 MED ORDER — LACTATED RINGERS IV SOLN
INTRAVENOUS | Status: DC
  Administered 2017-10-30: 10:00:00 via INTRAVENOUS

## 2017-10-30 MED ORDER — HYDRALAZINE HCL 20 MG/ML IJ SOLN
INTRAMUSCULAR | Status: AC
  Administered 2017-10-30: 10 mg via INTRAVENOUS
  Filled 2017-10-30: qty 1

## 2017-10-30 MED ORDER — OXYMETAZOLINE HCL 0.05 % NA SOLN
NASAL | Status: AC
  Filled 2017-10-30: qty 15

## 2017-10-30 SURGICAL SUPPLY — 19 items
CANISTER SUCT 3000ML PPV (MISCELLANEOUS) ×4 IMPLANT
CONT SPEC 4OZ CLIKSEAL STRL BL (MISCELLANEOUS) ×8 IMPLANT
COVER BACK TABLE 60X90IN (DRAPES) ×4 IMPLANT
COVER MAYO STAND STRL (DRAPES) ×4 IMPLANT
CRADLE DONUT ADULT HEAD (MISCELLANEOUS) ×4 IMPLANT
DRAPE HALF SHEET 40X57 (DRAPES) ×4 IMPLANT
GAUZE SPONGE 4X4 16PLY XRAY LF (GAUZE/BANDAGES/DRESSINGS) ×4 IMPLANT
GLOVE BIO SURGEON STRL SZ7.5 (GLOVE) ×4 IMPLANT
GOWN STRL REUS W/ TWL LRG LVL3 (GOWN DISPOSABLE) IMPLANT
GOWN STRL REUS W/TWL LRG LVL3 (GOWN DISPOSABLE)
GUARD TEETH (MISCELLANEOUS) ×4 IMPLANT
KIT PROLARN PLUS GEL W/NDL (Prosthesis and Implant ENT) ×4 IMPLANT
KIT TURNOVER KIT B (KITS) ×4 IMPLANT
NEEDLE HYPO 25GX1X1/2 BEV (NEEDLE) IMPLANT
NS IRRIG 1000ML POUR BTL (IV SOLUTION) ×4 IMPLANT
PAD ARMBOARD 7.5X6 YLW CONV (MISCELLANEOUS) ×8 IMPLANT
TOWEL OR 17X24 6PK STRL BLUE (TOWEL DISPOSABLE) ×8 IMPLANT
TUBE CONNECTING 12'X1/4 (SUCTIONS) ×1
TUBE CONNECTING 12X1/4 (SUCTIONS) ×3 IMPLANT

## 2017-10-30 NOTE — Transfer of Care (Signed)
Immediate Anesthesia Transfer of Care Note  Patient: Andrew Kline  Procedure(s) Performed: MICROLARYNGOSCOPY WITH PROLARYN INJECTION (N/A Throat)  Patient Location: PACU  Anesthesia Type:General  Level of Consciousness: sedated  Airway & Oxygen Therapy: Patient Spontanous Breathing and Patient connected to face mask oxygen  Post-op Assessment: Report given to RN, Post -op Vital signs reviewed and stable and Patient moving all extremities X 4  Post vital signs: Reviewed and stable  Last Vitals:  Vitals Value Taken Time  BP    Temp    Pulse 59 10/30/2017 12:56 PM  Resp 12 10/30/2017 12:56 PM  SpO2 96 % 10/30/2017 12:56 PM  Vitals shown include unvalidated device data.  Last Pain:  Vitals:   10/30/17 0940  PainSc: 0-No pain      Patients Stated Pain Goal: 4 (97/94/80 1655)  Complications: No apparent anesthesia complications

## 2017-10-30 NOTE — H&P (Signed)
Andrew Kline is an 52 y.o. male.   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  He presents to the OR for surgical management.  Past Medical History:  Diagnosis Date  . Anxiety   . Arthritis   . Depression   . Hospice care   . Kidney stone 03/22/15   left  . 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 Rectal pain   . Renal cell cancer ([place])    2012.   [name] Shortness of breath dyspnea   . Suprapubic catheter ([place])   . Ventral hernia    around ostomy site  . Wears dentures    upper plate  . Wears glasses     Past Surgical History:  Procedure Laterality Date  . BIOPSY N/A 09/30/2014   Procedure: BIOPSY;  Surgeon: Danie Binder, MD;  Location: [facility];  Service: Endoscopy;  Laterality: N/A;  Anal Canal  . COLOSTOMY N/A 02/11/2015   Procedure: COLOSTOMY;  Surgeon: Aviva Signs, MD;  Location: [facility];  Service: General;  Laterality: N/A;  . FLEXIBLE SIGMOIDOSCOPY N/A 09/30/2014   Procedure: FLEXIBLE SIGMOIDOSCOPY;  Surgeon: Danie Binder, MD;  Location: [facility];  Service: Endoscopy;  Laterality: N/A;  . FLEXIBLE SIGMOIDOSCOPY N/A 10/17/2014   Procedure: FLEXIBLE SIGMOIDOSCOPY;  Surgeon: Danie Binder, MD;  [facility];  Service: Endoscopy;  Laterality: N/A;  1325  . HYPOSPADIAS CORRECTION    . IR CATHETER TUBE CHANGE  11/06/2016  . IR GENERIC HISTORICAL  12/17/2015   IR CATHETER TUBE CHANGE 12/17/2015 [facility]  . IR GENERIC HISTORICAL  01/27/2016   IR NEPHROSTOMY PLACEMENT RIGHT 01/27/2016 [facility]  . IR GENERIC HISTORICAL  01/27/2016   IR NEPHROSTOMY PLACEMENT LEFT 01/27/2016 [facility]  . IR GENERIC HISTORICAL  03/08/2016   IR NEPHROSTOMY EXCHANGE LEFT 03/08/2016 Greggory Keen, MD [facility]  . IR GENERIC HISTORICAL  03/08/2016   IR NEPHROSTOMY EXCHANGE  RIGHT 03/08/2016 Greggory Keen, MD [facility]  . IR GENERIC HISTORICAL  03/07/2016   IR CATHETER TUBE CHANGE 03/07/2016 [facility]  . IR GENERIC HISTORICAL  03/28/2016   IR CATHETER TUBE CHANGE 03/28/2016 Corrie Mckusick, DO [facility]  . IR GENERIC HISTORICAL  03/28/2016   IR NEPHROSTOMY TUBE CHANGE 03/28/2016 Corrie Mckusick, DO [facility]  . IR GENERIC HISTORICAL  06/16/2016   IR CATHETER TUBE CHANGE 06/16/2016 Aletta Edouard, MD [facility]  . IR GENERIC HISTORICAL  06/16/2016   IR NEPHROSTOGRAM LEFT THRU EXISTING ACCESS 06/16/2016 Aletta Edouard, MD [facility]  . IR NEPHRO TUBE REMOV/FL  08/08/2017  . IR NEPHRO TUBE REMOV/FL  08/08/2017  . IR NEPHROSTOGRAM LEFT THRU EXISTING ACCESS  08/08/2017  . IR NEPHROSTOGRAM RIGHT THRU EXISTING ACCESS  08/08/2017  . IR NEPHROSTOMY PLACEMENT LEFT  04/08/2017  . IR NEPHROSTOMY PLACEMENT RIGHT  04/08/2017  . IRRIGATION AND DEBRIDEMENT BUTTOCKS Left 04/10/2017   Procedure: IRRIGATION AND DEBRIDEMENT BUTTOCKS left abscess;  Surgeon: Coralie Keens, MD;  Location: [place];  Service: General;  Laterality: Left;  . KNEE SURGERY Left    arthroscopy  . LAPAROSCOPIC PARTIAL NEPHRECTOMY  2012   right side  . LUNG BIOPSY Right   . PERINEAL PROCTECTOMY N/A 08/26/2015   Procedure:  PROCTECTOMY;  Surgeon: Aviva Signs, MD;  Location: [facility];  Service: General;  Laterality: N/A;  . RECTAL SURGERY      Family History  Problem Relation Age of Onset  . Healthy Mother   . Lung cancer Maternal Grandfather   . Colon cancer Maternal Uncle        age 17  . Diabetes Other    Social History:  reports that he has been smoking cigarettes.  He has a 30.00 pack-year smoking history. He has never used smokeless tobacco. He reports that he drinks alcohol. He reports that he does not use drugs.  Allergies:  Allergies  Allergen Reactions  . Oxaliplatin Itching  . Fentanyl     itching    Medications Prior to Admission  Medication Sig Dispense Refill  . amitriptyline  (ELAVIL) 50 MG tablet TAKE ONE TABLET BY MOUTH AT BEDTIME. 15 tablet 0  . benzonatate (TESSALON) 100 MG capsule Take 100 mg by mouth 3 (three) times daily as needed for cough.    . gabapentin (NEURONTIN) 100 MG capsule Take 100 mg by mouth 2 (two) times daily. Pt may take up to three times daily if needed    . HYDROmorphone (DILAUDID) 4 MG tablet Take 1 tablet (4 mg total) by mouth every 6 (six) hours as needed (breakthrough pain). 15 tablet 0  . levofloxacin (LEVAQUIN) 500 MG tablet Take 500 mg by mouth daily.    [name] LORazepam (ATIVAN) 1 MG tablet Take 1 mg by mouth every 8 (eight) hours.    . Morphine Sulfate ER 15 MG T12A Take 15 mg by mouth See admin instructions. Take 1 tablet (15 mg) by mouth twice daily - with a 30 mg tablet for a 45 mg dose    . Morphine Sulfate ER 60 MG T12A Take 60 mg by mouth 2 (two) times daily.    . OXYGEN Inhale 4 L into the lungs as needed (shortness of breath).    . promethazine (PHENERGAN) 25 MG tablet Take 25 mg by mouth every 4 (four) hours as needed for nausea or vomiting.    . temazepam (RESTORIL) 15 MG capsule Take 1 capsule (15 mg total) by mouth at bedtime. 30 capsule 0    Results for orders placed or performed during the hospital encounter of 10/30/17 (from the past 48 hour(s))  CBC     Status: Abnormal   Collection Time: 10/30/17  9:54 AM  Result Value Ref Range   WBC 17.2 (H) 4.0 - 10.5 K/uL   RBC 6.06 (H) 4.22 - 5.81 MIL/uL   Hemoglobin 15.7 13.0 - 17.0 g/dL   HCT 49.4 39.0 - 52.0 %   MCV 81.5 78.0 - 100.0 fL   MCH 25.9 (L) 26.0 - 34.0 pg   MCHC 31.8 30.0 - 36.0 g/dL   RDW 19.0 (H) 11.5 - 15.5 %   Platelets 430 (H) 150 - 400 K/uL    Comment: Performed at [facility], [place] [address] [place], [place] [address]  Basic metabolic panel     Status: Abnormal   Collection Time: 10/30/17  9:54 AM  Result Value Ref Range   Sodium 137 135 - 145 mmol/L   Potassium 3.8 3.5 - 5.1 mmol/L   Chloride 100 98 - 111 mmol/L    Comment: Please note change  in reference range.   CO2 24 22 - 32 mmol/L   Glucose, Bld 189 (H) 70 - 99 mg/dL    Comment: Please note change in reference range.   BUN 13 6 - 20 mg/dL  Comment: Please note change in reference range.   Creatinine, Ser 0.88 0.61 - 1.24 mg/dL   Calcium 9.4 8.9 - 10.3 mg/dL   GFR calc non Af Amer >60 >60 mL/min   GFR calc Af Amer >60 >60 mL/min    Comment: (NOTE) The eGFR has been calculated using the CKD EPI equation. This calculation has not been validated in all clinical situations. eGFR's persistently <60 mL/min signify possible Chronic Kidney Disease.    Anion gap 13 5 - 15    Comment: Performed at [facility] [address], [place], [place] [address]   No results found.  Review of Systems  Neurological: Positive for headaches.  All other systems reviewed and are negative.   Blood pressure (!) 163/74, pulse 88, temperature 99 F (37.2 C), resp. rate 18, height '5\' 3"'  (1.6 m), weight 190 lb (86.2 kg), SpO2 96 %. Physical Exam  Constitutional: He is oriented to person, place, and time. He appears well-developed and well-nourished. No distress.  HENT:  Head: Normocephalic and atraumatic.  Right Ear: External ear normal.  Left Ear: External ear normal.  Nose: Nose normal.  Mouth/Throat: Oropharynx is clear and moist.  Aphonic  Eyes: Pupils are equal, round, and reactive to light. Conjunctivae and EOM are normal.  Neck: Normal range of motion. Neck supple.  Cardiovascular: Normal rate.  Respiratory: Effort normal.  Musculoskeletal: Normal range of motion.  Neurological: He is alert and oriented to person, place, and time. No cranial nerve deficit.  Skin: Skin is warm and dry.  Psychiatric: He has a normal mood and affect. His behavior is normal. Judgment and thought content normal.     Assessment/Plan Left vocal fold paralysis, dysphonia To OR for SMDL with Prolaryn injection, jet ventilation.  Melida Quitter, MD 10/30/2017, 12:01 PM

## 2017-10-30 NOTE — Brief Op Note (Signed)
10/30/2017  12:43 PM  PATIENT:  Andrew Kline  51 y.o. male  PRE-OPERATIVE DIAGNOSIS:  dysphonia, vocal cord paralysis  POST-OPERATIVE DIAGNOSIS:  dysphonia, vocal cord paralysis  PROCEDURE:  Procedure(s): MICROLARYNGOSCOPY WITH PROLARYN INJECTION (N/A)  SURGEON:  Surgeon(s) and Role:    * Melida Quitter, MD - Primary  PHYSICIAN ASSISTANT:   ASSISTANTS: none   ANESTHESIA:   general  EBL:  None  BLOOD ADMINISTERED:none  DRAINS: none   LOCAL MEDICATIONS USED:  NONE  SPECIMEN:  No Specimen  DISPOSITION OF SPECIMEN:  N/A  COUNTS:  YES  TOURNIQUET:  * No tourniquets in log *  DICTATION: .Other Dictation: Dictation Number [id number]  PLAN OF CARE: Discharge to home after PACU  PATIENT DISPOSITION:  PACU - hemodynamically stable.   Delay start of Pharmacological VTE agent (>24hrs) due to surgical blood loss or risk of bleeding: no

## 2017-10-30 NOTE — Anesthesia Procedure Notes (Signed)
Procedure Name: General with mask airway Performed by: Neldon Newport, CRNA Pre-anesthesia Checklist: Timeout performed, Patient being monitored, Suction available, Emergency Drugs available and Patient identified Patient Re-evaluated:Patient Re-evaluated prior to induction Oxygen Delivery Method: Circle system utilized Preoxygenation: Pre-oxygenation with 100% oxygen Induction Type: IV induction Ventilation: Mask ventilation without difficulty

## 2017-10-30 NOTE — Anesthesia Postprocedure Evaluation (Signed)
Anesthesia Post Note  Patient: Andrew Kline  Procedure(s) Performed: MICROLARYNGOSCOPY WITH PROLARYN INJECTION (N/A Throat)     Patient location during evaluation: PACU Anesthesia Type: General Level of consciousness: awake and alert Pain management: pain level controlled Vital Signs Assessment: post-procedure vital signs reviewed and stable Respiratory status: spontaneous breathing, nonlabored ventilation, respiratory function stable and patient connected to nasal cannula oxygen Cardiovascular status: blood pressure returned to baseline and stable Postop Assessment: no apparent nausea or vomiting Anesthetic complications: no    Last Vitals:  Vitals:   10/30/17 1410 10/30/17 1425  BP: (!) 149/91 (!) 168/86  Pulse: 67 100  Resp: 13 15  Temp:    SpO2: 90% (!) 84%    Last Pain:  Vitals:   10/30/17 1325  PainSc: Asleep                 Adalia Pettis S

## 2017-10-30 NOTE — Progress Notes (Signed)
Patient states he does not want to brush teeth. States it takes too much energy and is not worth the energy expenditure.

## 2017-10-30 NOTE — Anesthesia Preprocedure Evaluation (Addendum)
Anesthesia Evaluation  Patient identified by MRN, date of birth, ID band Patient awake    Reviewed: Allergy & Precautions, NPO status , Patient's Chart, lab work & pertinent test results  Airway Mallampati: II       Dental  (+) Dental Advidsory Given, Upper Dentures   Pulmonary shortness of breath, Current Smoker,    breath sounds clear to auscultation       Cardiovascular negative cardio ROS   Rhythm:Regular Rate:Normal     Neuro/Psych PSYCHIATRIC DISORDERS Anxiety Depression    GI/Hepatic negative GI ROS, Neg liver ROS,   Endo/Other    Renal/GU Renal disease     Musculoskeletal  (+) Arthritis ,   Abdominal   Peds  Hematology   Anesthesia Other Findings States he fell in tub last night and hit his head.  Dr. Redmond Baseman evaluated for injury from fall.    Reproductive/Obstetrics                           Anesthesia Physical Anesthesia Plan  ASA: III  Anesthesia Plan: General   Post-op Pain Management:    Induction: Intravenous  PONV Risk Score and Plan: 2 and Ondansetron and Dexamethasone  Airway Management Planned: Oral ETT  Additional Equipment:   Intra-op Plan:   Post-operative Plan: Extubation in OR  Informed Consent: I have reviewed the patients History and Physical, chart, labs and discussed the procedure including the risks, benefits and alternatives for the proposed anesthesia with the patient or authorized representative who has indicated his/her understanding and acceptance.   Dental Advisory Given  Plan Discussed with: CRNA and Surgeon  Anesthesia Plan Comments: (Jet ventilation with Dr. Redmond Baseman)       Anesthesia Quick Evaluation

## 2017-10-30 NOTE — Op Note (Signed)
NAME: MONIQUE, GIFT MEDICAL RECORD [id number] ACCOUNT [id number] DATE OF BIRTH:13-Jan-1966 FACILITY: [facility] LOCATION: [facility] PHYSICIAN:Rennie Rouch Guido Sander, MD  OPERATIVE REPORT  DATE OF PROCEDURE:  10/30/2017  PREOPERATIVE DIAGNOSES: 1.  Dysphonia. 2.  Left vocal cord paralysis.  POSTOPERATIVE DIAGNOSES:   1.  Dysphonia. 2.  Left vocal cord paralysis.  PROCEDURE:  Suspended microdirect laryngoscopy with Prolaryn injection.  ANESTHESIA:  General jet ventilation anesthesia.  COMPLICATIONS:  None.  INDICATIONS:  The patient is a 52 year old male who has metastatic rectal cancer involving his lungs, who developed breathy dysphonia and was found to have paralysis of the left vocal fold.  He presents to the operating room for surgical management.  FINDINGS:  Upon direct laryngoscopy, vocal folds appeared normal with no lesion or mass.  Prolaryn 0.8 mL was injected in the left vocal fold with about two-thirds posteriorly and one-third anteriorly and 0.2 mL was injected in the right vocal fold  posteriorly.  DESCRIPTION OF PROCEDURE:  The patient was identified in the holding room and informed consent having been obtained including discussion of risks, benefits, alternatives.  The patient was brought to the operative suite and placed on the operative table  in supine position.  Anesthesia was induced and the patient was maintained via mask ventilation.  The eyes were taped closed and bed was turned 90 degrees from anesthesia.  The patient was given intravenous steroids during the case.  A damp gauze was  placed over the upper gum and a Stortz laryngoscope was placed into the supraglottic position and suspended to the Mayo stand using a Lewy arm.  Jet ventilation was then initiated.  A preoperative photograph was made using 0 degree telescope.  The  operating microscope was then brought into the field and Prolaryn was then injected first in the left vocal fold and then in the right with total  volumes listed under findings.  This resulted in a fairly well medialized left vocal fold and some plumping  of the right vocal fold.  Once completed, a postoperative photograph was made with a 0 degree telescope.  The airway was suctioned as the laryngoscope was taken out of suspension and removed from the patient's mouth.  He was returned to mask ventilation  and the bed was turned back to anesthesia for wakeup.  The patient was then awoken and moved to the recovery room in stable condition.  TN/NUANCE  D:10/30/2017 T:10/30/2017 JOB:[id number]

## 2017-10-31 ENCOUNTER — Encounter (HOSPITAL_COMMUNITY): Payer: Self-pay | Admitting: Otolaryngology

## 2017-11-20 ENCOUNTER — Encounter (HOSPITAL_COMMUNITY): Payer: Self-pay | Admitting: *Deleted

## 2017-11-20 ENCOUNTER — Other Ambulatory Visit: Payer: Self-pay

## 2017-11-20 ENCOUNTER — Emergency Department (HOSPITAL_COMMUNITY): Payer: Medicare Other

## 2017-11-20 ENCOUNTER — Emergency Department (HOSPITAL_COMMUNITY)
Admission: EM | Admit: 2017-11-20 | Discharge: 2017-11-20 | Disposition: A | Discharge status: Home / Self Care | Payer: Medicare Other | Attending: Emergency Medicine | Admitting: Emergency Medicine

## 2017-11-20 DIAGNOSIS — Z85048 Personal history of other malignant neoplasm of rectum, rectosigmoid junction, and anus: Secondary | ICD-10-CM | POA: Diagnosis not present

## 2017-11-20 DIAGNOSIS — Z66 Do not resuscitate: Secondary | ICD-10-CM | POA: Diagnosis not present

## 2017-11-20 DIAGNOSIS — F329 Major depressive disorder, single episode, unspecified: Secondary | ICD-10-CM | POA: Insufficient documentation

## 2017-11-20 DIAGNOSIS — F17210 Nicotine dependence, cigarettes, uncomplicated: Secondary | ICD-10-CM | POA: Insufficient documentation

## 2017-11-20 DIAGNOSIS — F419 Anxiety disorder, unspecified: Secondary | ICD-10-CM | POA: Diagnosis not present

## 2017-11-20 DIAGNOSIS — T859XXA Unspecified complication of internal prosthetic device, implant and graft, initial encounter: Secondary | ICD-10-CM

## 2017-11-20 DIAGNOSIS — Z79899 Other long term (current) drug therapy: Secondary | ICD-10-CM | POA: Insufficient documentation

## 2017-11-20 DIAGNOSIS — T83198A Other mechanical complication of other urinary devices and implants, initial encounter: Secondary | ICD-10-CM | POA: Insufficient documentation

## 2017-11-20 DIAGNOSIS — Y69 Unspecified misadventure during surgical and medical care: Secondary | ICD-10-CM | POA: Insufficient documentation

## 2017-11-20 DIAGNOSIS — T83010A Breakdown (mechanical) of cystostomy catheter, initial encounter: Secondary | ICD-10-CM

## 2017-11-20 HISTORY — PX: IR CATHETER TUBE CHANGE: IMG717

## 2017-11-20 LAB — URINALYSIS, ROUTINE W REFLEX MICROSCOPIC
BACTERIA UA: NONE SEEN
Bilirubin Urine: NEGATIVE
Glucose, UA: 50 mg/dL — AB
Ketones, ur: NEGATIVE mg/dL
Nitrite: NEGATIVE
PROTEIN: 100 mg/dL — AB
Specific Gravity, Urine: 1.018 (ref 1.005–1.030)
pH: 6 (ref 5.0–8.0)

## 2017-11-20 LAB — COMPREHENSIVE METABOLIC PANEL
ALBUMIN: 2.4 g/dL — AB (ref 3.5–5.0)
ALK PHOS: 152 U/L — AB (ref 38–126)
ALT: 19 U/L (ref 0–44)
ANION GAP: 13 (ref 5–15)
AST: 24 U/L (ref 15–41)
BUN: 7 mg/dL (ref 6–20)
CO2: 25 mmol/L (ref 22–32)
Calcium: 8.8 mg/dL — ABNORMAL LOW (ref 8.9–10.3)
Chloride: 99 mmol/L (ref 98–111)
Creatinine, Ser: 1.1 mg/dL (ref 0.61–1.24)
GFR calc Af Amer: >60 mL/min (ref >60)
GFR calc non Af Amer: >60 mL/min (ref >60)
GLUCOSE: 142 mg/dL — AB (ref 70–99)
Potassium: 3.9 mmol/L (ref 3.5–5.1)
SODIUM: 137 mmol/L (ref 135–145)
Total Bilirubin: 0.5 mg/dL (ref 0.3–1.2)
Total Protein: 7.5 g/dL (ref 6.5–8.1)

## 2017-11-20 LAB — CBC WITH DIFFERENTIAL/PLATELET
ABS IMMATURE GRANULOCYTES: 0.1 10*3/uL (ref 0.0–0.1)
BASOS PCT: 0 %
Basophils Absolute: 0.1 10*3/uL (ref 0.0–0.1)
Eosinophils Absolute: 0.3 10*3/uL (ref 0.0–0.7)
Eosinophils Relative: 2 %
HCT: 43.8 % (ref 39.0–52.0)
HEMOGLOBIN: 13.5 g/dL (ref 13.0–17.0)
Immature Granulocytes: 1 %
Lymphocytes Relative: 10 %
Lymphs Abs: 1.7 10*3/uL (ref 0.7–4.0)
MCH: 25.7 pg — AB (ref 26.0–34.0)
MCHC: 30.8 g/dL (ref 30.0–36.0)
MCV: 83.4 fL (ref 78.0–100.0)
MONO ABS: 1.1 10*3/uL — AB (ref 0.1–1.0)
MONOS PCT: 7 %
NEUTROS ABS: 12.9 10*3/uL — AB (ref 1.7–7.7)
Neutrophils Relative %: 80 %
Platelets: 495 10*3/uL — ABNORMAL HIGH (ref 150–400)
RBC: 5.25 MIL/uL (ref 4.22–5.81)
RDW: 18.1 % — ABNORMAL HIGH (ref 11.5–15.5)
WBC: 16 10*3/uL — ABNORMAL HIGH (ref 4.0–10.5)

## 2017-11-20 MED ORDER — LIDOCAINE HCL 1 % IJ SOLN
INTRAMUSCULAR | Status: AC
  Filled 2017-11-20: qty 20

## 2017-11-20 MED ORDER — LIDOCAINE HCL (PF) 1 % IJ SOLN
INTRAMUSCULAR | Status: AC | PRN
  Administered 2017-11-20: 5 mL

## 2017-11-20 MED ORDER — IOPAMIDOL (ISOVUE-300) INJECTION 61%
INTRAVENOUS | Status: AC
  Administered 2017-11-20: 15 mL
  Filled 2017-11-20: qty 50

## 2017-11-20 MED ORDER — ONDANSETRON HCL 4 MG/2ML IJ SOLN
4.0000 mg | Freq: Once | INTRAMUSCULAR | Status: AC
  Administered 2017-11-20: 4 mg via INTRAVENOUS
  Filled 2017-11-20: qty 2

## 2017-11-20 NOTE — ED Provider Notes (Signed)
[facility] EMERGENCY DEPARTMENT Provider Note   CSN: [id number] Arrival date & time: 11/20/17  1456     History   Chief Complaint No chief complaint on file.   HPI Andrew Kline is a 52 y.o. male.  52 year old male with prior history as detailed below presents with complaint of displaced suprapubic catheter.  Patient with long-standing history of suprapubic catheter.  He reports that it inadvertently came out this morning.  His health aide at home was unable to place a standard Foley.  Patient reports that he had a pigtail Foley in his suprapubic site.  He reports that a "standard Foley never goes in."  Patient denies other complaint.  He presents requesting replacement of his suprapubic catheter.  The history is provided by the patient and medical records.  Illness  This is a new problem. The current episode started 6 to 12 hours ago. The problem occurs constantly. The problem has not changed since onset.Pertinent negatives include no chest pain, no abdominal pain, no headaches and no shortness of breath. Nothing aggravates the symptoms. Nothing relieves the symptoms. He has tried nothing for the symptoms. The treatment provided no relief.    Past Medical History:  Diagnosis Date  . Anxiety   . Arthritis   . Depression   . Hospice care   . Kidney stone 03/22/15   left  . 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 Rectal pain   . Renal cell cancer ([place])    2012.   [name] Shortness of breath dyspnea   . Suprapubic catheter ([place])   . Ventral hernia    around ostomy site  . Wears dentures    upper plate  . Wears glasses     Patient Active Problem List   Diagnosis Date Noted  . Hydronephrosis 04/07/2017  . Leukocytosis 04/07/2017  . Tachycardia 04/07/2017  . Parastomal hernia without obstruction or gangrene 01/16/2017  . Hydronephrosis of left kidney   . Abdominal pain 01/15/2017  . Depression with anxiety 01/15/2017  .  Metastatic cancer ([place]) 01/15/2017  . Abscess 12/16/2016  . Abscess of buttock 12/15/2016  . Hypokalemia 10/01/2016  . Left buttock abscess 09/30/2016  . Perirectal abscess   . Urinary retention 03/28/2016  . Suprapubic catheter dysfunction ([place]) 03/28/2016  . Goals of care, counseling/discussion   . DNR (do not resuscitate) discussion   . Pyomyositis: Left gluteus maximus per CT 03/07/2016 03/08/2016  . Carcinoma of kidney ([place])   . Sepsis affecting skin   . Suprapubic catheter ([place])   . Uncontrolled pain   . Palliative care encounter   . Pelvic abscess in male [facility])   . Sepsis ([place]) 03/04/2016  . Renal cell carcinoma ([place]) 03/04/2016  . Hyponatremia 03/04/2016  . Complicated UTI (urinary tract infection) 01/25/2016  . Physical deconditioning 07/07/2015  . Muscular deconditioning 07/07/2015  . Malnutrition of moderate degree ([place]) 02/13/2015  . Rectal cancer metastasized to lung [facility]) 10/30/2014    Past Surgical History:  Procedure Laterality Date  . BIOPSY N/A 09/30/2014   Procedure: BIOPSY;  Surgeon: Danie Binder, MD;  Location: [facility];  Service: Endoscopy;  Laterality: N/A;  Anal Canal  . COLOSTOMY N/A 02/11/2015   Procedure: COLOSTOMY;  Surgeon: Aviva Signs, MD;  Location: [facility];  Service: General;  Laterality: N/A;  . FLEXIBLE SIGMOIDOSCOPY N/A 09/30/2014   Procedure: FLEXIBLE SIGMOIDOSCOPY;  Surgeon: Danie Binder, MD;  Location: [facility];  Service: Endoscopy;  Laterality: N/A;  . FLEXIBLE SIGMOIDOSCOPY N/A 10/17/2014   Procedure: FLEXIBLE SIGMOIDOSCOPY;  Surgeon: Danie Binder, MD;  [facility];  Service: Endoscopy;  Laterality: N/A;  1325  . HYPOSPADIAS CORRECTION    . IR CATHETER TUBE CHANGE  11/06/2016  . IR GENERIC HISTORICAL  12/17/2015   IR CATHETER TUBE CHANGE 12/17/2015 [facility]  . IR GENERIC HISTORICAL  01/27/2016   IR NEPHROSTOMY PLACEMENT RIGHT 01/27/2016 [facility]  . IR GENERIC HISTORICAL  01/27/2016   IR NEPHROSTOMY PLACEMENT LEFT  01/27/2016 [facility]  . IR GENERIC HISTORICAL  03/08/2016   IR NEPHROSTOMY EXCHANGE LEFT 03/08/2016 Greggory Keen, MD [facility]  . IR GENERIC HISTORICAL  03/08/2016   IR NEPHROSTOMY EXCHANGE RIGHT 03/08/2016 Greggory Keen, MD [facility]  . IR GENERIC HISTORICAL  03/07/2016   IR CATHETER TUBE CHANGE 03/07/2016 [facility]  . IR GENERIC HISTORICAL  03/28/2016   IR CATHETER TUBE CHANGE 03/28/2016 Corrie Mckusick, DO [facility]  . IR GENERIC HISTORICAL  03/28/2016   IR NEPHROSTOMY TUBE CHANGE 03/28/2016 Corrie Mckusick, DO [facility]  . IR GENERIC HISTORICAL  06/16/2016   IR CATHETER TUBE CHANGE 06/16/2016 Aletta Edouard, MD [facility]  . IR GENERIC HISTORICAL  06/16/2016   IR NEPHROSTOGRAM LEFT THRU EXISTING ACCESS 06/16/2016 Aletta Edouard, MD [facility]  . IR NEPHRO TUBE REMOV/FL  08/08/2017  . IR NEPHRO TUBE REMOV/FL  08/08/2017  . IR NEPHROSTOGRAM LEFT THRU EXISTING ACCESS  08/08/2017  . IR NEPHROSTOGRAM RIGHT THRU EXISTING ACCESS  08/08/2017  . IR NEPHROSTOMY PLACEMENT LEFT  04/08/2017  . IR NEPHROSTOMY PLACEMENT RIGHT  04/08/2017  . IRRIGATION AND DEBRIDEMENT BUTTOCKS Left 04/10/2017   Procedure: IRRIGATION AND DEBRIDEMENT BUTTOCKS left abscess;  Surgeon: Coralie Keens, MD;  Location: [facility];  Service: General;  Laterality: Left;  . KNEE SURGERY Left    arthroscopy  . LAPAROSCOPIC PARTIAL NEPHRECTOMY  2012   right side  . LUNG BIOPSY Right   . MICROLARYNGOSCOPY N/A 10/30/2017   Procedure: MICROLARYNGOSCOPY WITH PROLARYN INJECTION;  Surgeon: Melida Quitter, MD;  Location[facility];  Service: ENT;  Laterality: N/A;  . PERINEAL PROCTECTOMY N/A 08/26/2015   Procedure:  PROCTECTOMY;  Surgeon: Aviva Signs, MD;  Location: [facility];  Service: General;  Laterality: N/A;  . RECTAL SURGERY          Home Medications    Prior to Admission medications   Medication Sig Start Date End Date Taking? Authorizing Provider  amitriptyline (ELAVIL) 50 MG tablet TAKE ONE TABLET BY MOUTH AT BEDTIME.  05/10/16  Yes Penland, Kelby Fam, MD  gabapentin (NEURONTIN) 100 MG capsule Take 100 mg by mouth 3 (three) times daily as needed (pain).    Yes [provider]  HYDROmorphone (DILAUDID) 4 MG tablet Take 1 tablet (4 mg total) by mouth every 6 (six) hours as needed (breakthrough pain). Patient taking differently: Take 4 mg by mouth every 4 (four) hours as needed for moderate pain (breakthrough pain).  04/19/17  Yes [name], FNP  levofloxacin (LEVAQUIN) 500 MG tablet Take 500 mg by mouth daily.   Yes [provider]  LORazepam (ATIVAN) 1 MG tablet Take 1 mg by mouth every 4 (four) hours as needed for anxiety.    Yes [provider]  Morphine Sulfate ER 60 MG T12A Take 60 mg by mouth 4 (four) times daily.    Yes [provider]  OXYGEN Inhale 4 L into the lungs as needed (shortness of breath).  Yes [provider]  pregabalin (LYRICA) 75 MG capsule Take 75 mg by mouth 2 (two) times daily.   Yes [provider]  promethazine (PHENERGAN) 25 MG tablet Take 25 mg by mouth every 4 (four) hours as needed for nausea or vomiting.   Yes [provider]  temazepam (RESTORIL) 15 MG capsule Take 1 capsule (15 mg total) by mouth at bedtime. 04/19/17  Yes [name], FNP    Family History Family History  Problem Relation Age of Onset  . Healthy Mother   . Lung cancer Maternal Grandfather   . Colon cancer Maternal Uncle        age 41  . Diabetes Other     Social History Social History   Tobacco Use  . Smoking status: Current Every Day Smoker    Packs/day: 1.00    Years: 30.00    Pack years: 30.00    Types: Cigarettes  . Smokeless tobacco: Never Used  Substance Use Topics  . Alcohol use: Yes    Alcohol/week: 0.0 oz    Comment: Occ  . Drug use: No     Allergies   Oxaliplatin and Fentanyl   Review of Systems Review of Systems  Respiratory: Negative for shortness of breath.   Cardiovascular: Negative for chest  pain.  Gastrointestinal: Negative for abdominal pain.  Neurological: Negative for headaches.  All other systems reviewed and are negative.    Physical Exam Updated Vital Signs BP 132/78 (BP Location: Right Arm)   Pulse 89   Temp 99.1 F (37.3 C) (Oral)   Resp 18   SpO2 94%   Physical Exam  Constitutional: He is oriented to person, place, and time. He appears well-developed and well-nourished. No distress.  HENT:  Head: Normocephalic and atraumatic.  Mouth/Throat: Oropharynx is clear and moist.  Eyes: Pupils are equal, round, and reactive to light. Conjunctivae and EOM are normal.  Neck: Normal range of motion. Neck supple.  Cardiovascular: Normal rate, regular rhythm and normal heart sounds.  Pulmonary/Chest: Effort normal and breath sounds normal. No respiratory distress.  Abdominal: Soft. He exhibits no distension. There is no tenderness.  Prepubic catheter site is clean dry and intact.  8 French Foley attempted to be passed - this was unsuccessful.  Musculoskeletal: Normal range of motion. He exhibits no edema or deformity.  Neurological: He is alert and oriented to person, place, and time.  Skin: Skin is warm and dry.  Psychiatric: He has a normal mood and affect.  Nursing note and vitals reviewed.    ED Treatments / Results  Labs (all labs ordered are listed, but only abnormal results are displayed) Labs Reviewed  COMPREHENSIVE METABOLIC PANEL - Abnormal; Notable for the following components:      Result Value   Glucose, Bld 142 (*)    Calcium 8.8 (*)    Albumin 2.4 (*)    Alkaline Phosphatase 152 (*)    All other components within normal limits  CBC WITH DIFFERENTIAL/PLATELET - Abnormal; Notable for the following components:   WBC 16.0 (*)    MCH 25.7 (*)    RDW 18.1 (*)    Platelets 495 (*)    Neutro Abs 12.9 (*)    Monocytes Absolute 1.1 (*)    All other components within normal limits  URINALYSIS, ROUTINE W REFLEX MICROSCOPIC     EKG None  Radiology No results found.  Procedures Procedures (including critical care time)  Medications Ordered in ED Medications  lidocaine (XYLOCAINE) 1 % (  with pres) injection (has no administration in time range)  ondansetron (ZOFRAN) injection 4 mg (4 mg Intravenous Given 11/20/17 1735)  iopamidol (ISOVUE-300) 61 % injection (15 mLs  Contrast Given 11/20/17 2052)  lidocaine (PF) (XYLOCAINE) 1 % injection (5 mLs Infiltration Given 11/20/17 2046)     Initial Impression / Assessment and Plan / ED Course  I have reviewed the triage vital signs and the nursing notes.  Pertinent labs & imaging results that were available during my care of the patient were reviewed by me and considered in my medical decision making (see chart for details).     MDM  Screen complete  Patient presents for replacement of his displaced suprapubic catheter.  Patient with complex history and abnormal pelvic anatomy.  Case discussed with both IR (H[place]) and urology Alinda Money).  Decision made to place the catheter in IR.  Patient feels improved following his ED evaluation and work-up.  He appears to have had successful placement of his suprapubic catheter in IR.  He now declines further treatment.  He desires discharge home.  He is aware that we have yet to obtain a UA.  He is adamant that he leave the hospital at this time after placement of a suprapubic catheter.  Strict return precautions given and understood.  Importance of close follow-up was repeatedly stressed.  Final Clinical Impressions(s) / ED Diagnoses   Final diagnoses:  Complication of catheter  Suprapubic catheter dysfunction, initial encounter [facility])    ED Discharge Orders    None       Valarie Merino, MD 11/20/17 2135

## 2017-11-20 NOTE — ED Triage Notes (Signed)
Pt reports that his suprapubic cath came out yesterday morning and home health unable to successfully replace it. No acute distress is noted at triage.

## 2017-11-20 NOTE — Procedures (Addendum)
16 Fr suprapubic catheter replacement Tip is in bladder EBL 0 Comp 0

## 2017-11-20 NOTE — ED Notes (Signed)
repaged Andrew Kline

## 2017-11-20 NOTE — ED Notes (Signed)
Pt transported to IR 

## 2017-11-20 NOTE — ED Notes (Signed)
Pt. Return from IR via stretcher.

## 2017-11-20 NOTE — ED Provider Notes (Signed)
Patient placed in Quick Look pathway, seen and evaluated   Chief Complaint: Pt needs suprapubic cath replaced  HPI:   Suprapubic cath fell out  ROS:   Physical Exam:   Gen: No distress  Neuro: Awake and Alert  Skin: Warm    Focused Exam: normal resp, Hear rrr   Initiation of care has begun. The patient has been counseled on the process, plan, and necessity for staying for the completion/evaluation, and the remainder of the medical screening examination   Sidney Ace 11/20/17 [place], [place], MD 11/22/17 [phone]

## 2017-11-20 NOTE — ED Notes (Addendum)
SN attempted to insert suprapubic; unsuccessful as cath wld not advance. MD notified

## 2017-11-20 NOTE — Discharge Instructions (Addendum)
Return for any problem.  Follow-up with your regular urologist as instructed.

## 2017-11-21 ENCOUNTER — Encounter (HOSPITAL_COMMUNITY): Payer: Self-pay | Admitting: Interventional Radiology

## 2017-11-28 ENCOUNTER — Emergency Department (HOSPITAL_COMMUNITY)
Admission: EM | Admit: 2017-11-28 | Discharge: 2017-11-28 | Disposition: A | Discharge status: Home / Self Care | Attending: Emergency Medicine | Admitting: Emergency Medicine

## 2017-11-28 ENCOUNTER — Encounter (HOSPITAL_COMMUNITY): Payer: Self-pay | Admitting: Emergency Medicine

## 2017-11-28 ENCOUNTER — Other Ambulatory Visit: Payer: Self-pay

## 2017-11-28 DIAGNOSIS — Y731 Therapeutic (nonsurgical) and rehabilitative gastroenterology and urology devices associated with adverse incidents: Secondary | ICD-10-CM | POA: Diagnosis not present

## 2017-11-28 DIAGNOSIS — T83010A Breakdown (mechanical) of cystostomy catheter, initial encounter: Secondary | ICD-10-CM | POA: Diagnosis present

## 2017-11-28 DIAGNOSIS — F17210 Nicotine dependence, cigarettes, uncomplicated: Secondary | ICD-10-CM | POA: Insufficient documentation

## 2017-11-28 DIAGNOSIS — Z79899 Other long term (current) drug therapy: Secondary | ICD-10-CM | POA: Insufficient documentation

## 2017-11-28 LAB — BASIC METABOLIC PANEL
Anion gap: 10 (ref 5–15)
BUN: 12 mg/dL (ref 6–20)
CHLORIDE: 99 mmol/L (ref 98–111)
CO2: 24 mmol/L (ref 22–32)
CREATININE: 1.19 mg/dL (ref 0.61–1.24)
Calcium: 8.5 mg/dL — ABNORMAL LOW (ref 8.9–10.3)
GFR calc non Af Amer: >60 mL/min (ref >60)
Glucose, Bld: 121 mg/dL — ABNORMAL HIGH (ref 70–99)
Potassium: 3.6 mmol/L (ref 3.5–5.1)
SODIUM: 133 mmol/L — AB (ref 135–145)

## 2017-11-28 LAB — CBC WITH DIFFERENTIAL/PLATELET
BASOS ABS: 0 10*3/uL (ref 0.0–0.1)
BASOS PCT: 0 %
EOS ABS: 0.2 10*3/uL (ref 0.0–0.7)
Eosinophils Relative: 2 %
HCT: 40.3 % (ref 39.0–52.0)
HEMOGLOBIN: 13.5 g/dL (ref 13.0–17.0)
Lymphocytes Relative: 13 %
Lymphs Abs: 1.7 10*3/uL (ref 0.7–4.0)
MCH: 27 pg (ref 26.0–34.0)
MCHC: 33.5 g/dL (ref 30.0–36.0)
MCV: 80.6 fL (ref 78.0–100.0)
MONOS PCT: 9 %
Monocytes Absolute: 1.2 10*3/uL — ABNORMAL HIGH (ref 0.1–1.0)
NEUTROS PCT: 76 %
Neutro Abs: 10.4 10*3/uL — ABNORMAL HIGH (ref 1.7–7.7)
Platelets: 566 10*3/uL — ABNORMAL HIGH (ref 150–400)
RBC: 5 MIL/uL (ref 4.22–5.81)
RDW: 17.3 % — ABNORMAL HIGH (ref 11.5–15.5)
WBC: 13.5 10*3/uL — AB (ref 4.0–10.5)

## 2017-11-28 NOTE — ED Notes (Signed)
Dr. Tomi Bamberger informed of pt's pulse.  No new orders received.

## 2017-11-28 NOTE — ED Notes (Signed)
Bed: WA27 Expected date:  Expected time:  Means of arrival:  Comments: 

## 2017-11-28 NOTE — Consult Note (Signed)
Reason for Consult: Displacement of SP Tube for Urethral Disruption   Referring Physician: Davonna Belling MD  Andrew Kline is an 52 y.o. male.   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 He has progressive metastatic cancer on hospice. Cr 1.2 today.  PMH sig for metastatic colon cancer / hospice, massive peristomal hernia.  Today "Andrew Kline" is seen as urgent consult for displaced SPT. It was replaced by IR 7/22.   Past Medical History:  Diagnosis Date  . Anxiety   . Arthritis   . Depression   . Hospice care   . Kidney stone 03/22/15   left  . 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 Rectal pain   . Renal cell cancer ([place])    2012.   [name] Shortness of breath dyspnea   . Suprapubic catheter ([place])   . Ventral hernia    around ostomy site  . Wears dentures    upper plate  . Wears glasses     Past Surgical History:  Procedure Laterality Date  . BIOPSY N/A 09/30/2014   Procedure: BIOPSY;  Surgeon: Danie Binder, MD;  Location[facility];  Service: Endoscopy;  Laterality: N/A;  Anal Canal  . COLOSTOMY N/A 02/11/2015   Procedure: COLOSTOMY;  Surgeon: Aviva Signs, MD;  Location: [facility];  Service: General;  Laterality: N/A;  . FLEXIBLE SIGMOIDOSCOPY N/A 09/30/2014   Procedure: FLEXIBLE SIGMOIDOSCOPY;  Surgeon: Danie Binder, MD;  Location: [facility];  Service: Endoscopy;  Laterality: N/A;  . FLEXIBLE SIGMOIDOSCOPY N/A 10/17/2014   Procedure: FLEXIBLE SIGMOIDOSCOPY;  Surgeon: Danie Binder, MD;  [facility];  Service: Endoscopy;  Laterality: N/A;  1325  . HYPOSPADIAS CORRECTION    . IR CATHETER TUBE CHANGE  11/06/2016  . IR CATHETER TUBE CHANGE  11/20/2017  . IR GENERIC HISTORICAL  12/17/2015   IR CATHETER TUBE CHANGE 12/17/2015 [facility]  . IR GENERIC HISTORICAL  01/27/2016   IR NEPHROSTOMY PLACEMENT RIGHT 01/27/2016 [facility]   . IR GENERIC HISTORICAL  01/27/2016   IR NEPHROSTOMY PLACEMENT LEFT 01/27/2016 [facility]  . IR GENERIC HISTORICAL  03/08/2016   IR NEPHROSTOMY EXCHANGE LEFT 03/08/2016 Greggory Keen, MD [facility]  . IR GENERIC HISTORICAL  03/08/2016   IR NEPHROSTOMY EXCHANGE RIGHT 03/08/2016 Greggory Keen, MD [facility]  . IR GENERIC HISTORICAL  03/07/2016   IR CATHETER TUBE CHANGE 03/07/2016 [facility]  . IR GENERIC HISTORICAL  03/28/2016   IR CATHETER TUBE CHANGE 03/28/2016 Corrie Mckusick, DO [facility]  . IR GENERIC HISTORICAL  03/28/2016   IR NEPHROSTOMY TUBE CHANGE 03/28/2016 Corrie Mckusick, DO [facility]  . IR GENERIC HISTORICAL  06/16/2016   IR CATHETER TUBE CHANGE 06/16/2016 Aletta Edouard, MD [facility]  . IR GENERIC HISTORICAL  06/16/2016   IR NEPHROSTOGRAM LEFT THRU EXISTING ACCESS 06/16/2016 Aletta Edouard, MD [facility]  . IR NEPHRO TUBE REMOV/FL  08/08/2017  . IR NEPHRO TUBE REMOV/FL  08/08/2017  . IR NEPHROSTOGRAM LEFT THRU EXISTING ACCESS  08/08/2017  . IR NEPHROSTOGRAM RIGHT THRU EXISTING ACCESS  08/08/2017  . IR NEPHROSTOMY PLACEMENT LEFT  04/08/2017  . IR NEPHROSTOMY PLACEMENT RIGHT  04/08/2017  . IRRIGATION AND DEBRIDEMENT BUTTOCKS Left 04/10/2017   Procedure: IRRIGATION AND DEBRIDEMENT BUTTOCKS left abscess;  Surgeon: Coralie Keens, MD;  Location: [place];  Service: General;  Laterality: Left;  . KNEE SURGERY Left    arthroscopy  . LAPAROSCOPIC PARTIAL NEPHRECTOMY  2012   right side  . LUNG BIOPSY Right   . MICROLARYNGOSCOPY N/A 10/30/2017   Procedure: MICROLARYNGOSCOPY WITH PROLARYN INJECTION;  Surgeon: Melida Quitter, MD;  Location: [facility] OR;  Service: ENT;  Laterality: N/A;  . PERINEAL PROCTECTOMY N/A 08/26/2015   Procedure:  PROCTECTOMY;  Surgeon: Aviva Signs, MD;  Location[facility];  Service: General;  Laterality: N/A;  . RECTAL SURGERY      Family History  Problem Relation Age of Onset  . Healthy Mother   . Lung cancer Maternal Grandfather   . Colon cancer Maternal  Uncle        age 46  . Diabetes Other     Social History:  reports that he has been smoking cigarettes.  He has a 30.00 pack-year smoking history. He has never used smokeless tobacco. He reports that he drinks alcohol. He reports that he does not use drugs.  Allergies:  Allergies  Allergen Reactions  . Oxaliplatin Itching  . Fentanyl Itching    Medications: I have reviewed the patient's current medications.  Results for orders placed or performed during the hospital encounter of 11/28/17 (from the past 48 hour(s))  CBC with Differential     Status: Abnormal   Collection Time: 11/28/17  4:04 PM  Result Value Ref Range   WBC 13.5 (H) 4.0 - 10.5 K/uL   RBC 5.00 4.22 - 5.81 MIL/uL   Hemoglobin 13.5 13.0 - 17.0 g/dL   HCT 40.3 39.0 - 52.0 %   MCV 80.6 78.0 - 100.0 fL   MCH 27.0 26.0 - 34.0 pg   MCHC 33.5 30.0 - 36.0 g/dL   RDW 17.3 (H) 11.5 - 15.5 %   Platelets 566 (H) 150 - 400 K/uL   Neutrophils Relative % 76 %   Neutro Abs 10.4 (H) 1.7 - 7.7 K/uL   Lymphocytes Relative 13 %   Lymphs Abs 1.7 0.7 - 4.0 K/uL   Monocytes Relative 9 %   Monocytes Absolute 1.2 (H) 0.1 - 1.0 K/uL   Eosinophils Relative 2 %   Eosinophils Absolute 0.2 0.0 - 0.7 K/uL   Basophils Relative 0 %   Basophils Absolute 0.0 0.0 - 0.1 K/uL    Comment: Performed at [facility], [address], [place], [place] [address]  Basic metabolic panel     Status: Abnormal   Collection Time: 11/28/17  4:04 PM  Result Value Ref Range   Sodium 133 (L) 135 - 145 mmol/L   Potassium 3.6 3.5 - 5.1 mmol/L   Chloride 99 98 - 111 mmol/L   CO2 24 22 - 32 mmol/L   Glucose, Bld 121 (H) 70 - 99 mg/dL   BUN 12 6 - 20 mg/dL   Creatinine, Ser 1.19 0.61 - 1.24 mg/dL   Calcium 8.5 (L) 8.9 - 10.3 mg/dL   GFR calc non Af Amer >60 >60 mL/min   GFR calc Af Amer >60 >60 mL/min    Comment: (NOTE) The eGFR has been calculated using the CKD EPI equation. This calculation has not been validated in all clinical situations. eGFR's  persistently <60 mL/min signify possible Chronic Kidney Disease.    Anion gap 10 5 - 15    Comment: Performed at [facility], [address], [place], [place] [address]    No results found.  Review of Systems  Constitutional: Positive for malaise/fatigue and weight loss.  HENT: Positive for hearing  loss.   Eyes: Negative.   Respiratory: Negative.   Cardiovascular: Negative.   Genitourinary: Positive for urgency.  Musculoskeletal: Negative.   Skin: Negative.   Neurological: Negative.   Endo/Heme/Allergies: Negative.   Psychiatric/Behavioral: Negative.    Blood pressure 116/75, pulse 98, temperature 98 F (36.7 C), temperature source Oral, resp. rate 18, height '5\' 8"'  (1.727 m), weight 86.2 kg (190 lb), SpO2 99 %. Physical Exam  Constitutional: He appears well-developed.  Stigmata of chonic disease, in ER. Very pleasant.   HENT:  Head: Normocephalic.  Eyes: Pupils are equal, round, and reactive to light.  Neck: Normal range of motion.  Cardiovascular: Normal rate.  GI:  LLQ colostomy patent of stool and gas with large parastomal hernia.   Genitourinary: Penis normal.  Genitourinary Comments: SPT area with erythema and old SPT track slightly visible, not draining urine.   Neurological: He is alert.  Skin: Skin is warm.  Psychiatric: He has a normal mood and affect.   BEDSIDE CYSTOSCOPY / SPT PLACEMENT:  Using asetpic technique with iodiine prep the SPT tract area was probed with sensor wire. Fortunately suspect tract cannulated and easily advanced with some slight trickly of urien around. 46F uromaxx balloon dialation catheter advanced across, inflated to 20ATM x 90 seconds and removed, keeping wire in place. Cystoscopy performed with 72F flexible cystoscope over wire which confirmed urinary bladder placement. 53F counciil catheter then advanced over wire into bladder, 10cc sterile water in balloon and connected to gravity drain. Efflux 150cc cloudy urine that is non-feculent.  Pt tollerated well.   Assessment/Plan:  1 - Displacement of SP Tube for Urethral Disruption - SPT replaced today as per above. OK for DC home back with home hospice.   Andrew Kline 11/28/2017, 6:01 PM

## 2017-11-28 NOTE — ED Notes (Signed)
EDP at the bedside.  ?

## 2017-11-28 NOTE — Discharge Instructions (Addendum)
Follow up per Dr Lucy Antigua instructions

## 2017-11-28 NOTE — ED Provider Notes (Signed)
[facility] EMERGENCY DEPARTMENT Provider Note   CSN: [id number] Arrival date & time: 11/28/17  1504     History   Chief Complaint Chief Complaint  Patient presents with  . Post-op Problem    HPI Andrew Andrew is a 52 y.o. male.  HPI  Patient with chronic suprapubic catheter due to rectal cancer.  At around 3 in the morning it came out.  Suprapubic pain now.  States he has been leaking a little bit of urine.  No fevers.  States he has a pigtail catheter and has not been able to get replaced with different catheters.  Dr. Diona Fanti is his urologist.  No fevers has not felt as if he has an infection.  Past Medical History:  Diagnosis Date  . Anxiety   . Arthritis   . Depression   . Hospice care   . Kidney stone 03/22/15   left  . 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 Rectal pain   . Renal cell cancer ([place])    2012.   [name] Shortness of breath dyspnea   . Suprapubic catheter ([place])   . Ventral hernia    around ostomy site  . Wears dentures    upper plate  . Wears glasses     Patient Active Problem List   Diagnosis Date Noted  . Hydronephrosis 04/07/2017  . Leukocytosis 04/07/2017  . Tachycardia 04/07/2017  . Parastomal hernia without obstruction or gangrene 01/16/2017  . Hydronephrosis of left kidney   . Abdominal pain 01/15/2017  . Depression with anxiety 01/15/2017  . Metastatic cancer ([place]) 01/15/2017  . Abscess 12/16/2016  . Abscess of buttock 12/15/2016  . Hypokalemia 10/01/2016  . Left buttock abscess 09/30/2016  . Perirectal abscess   . Urinary retention 03/28/2016  . Suprapubic catheter dysfunction ([place]) 03/28/2016  . Goals of care, counseling/discussion   . DNR (do not resuscitate) discussion   . Pyomyositis: Left gluteus maximus per CT 03/07/2016 03/08/2016  . Carcinoma of kidney ([place])   . Sepsis affecting skin   . Suprapubic catheter ([place])   . Uncontrolled pain   . Palliative care encounter   . Pelvic abscess in  male [facility])   . Sepsis ([place]) 03/04/2016  . Renal cell carcinoma ([place]) 03/04/2016  . Hyponatremia 03/04/2016  . Complicated UTI (urinary tract infection) 01/25/2016  . Physical deconditioning 07/07/2015  . Muscular deconditioning 07/07/2015  . Malnutrition of moderate degree ([place]) 02/13/2015  . Rectal cancer metastasized to lung [facility]) 10/30/2014    Past Surgical History:  Procedure Laterality Date  . BIOPSY N/A 09/30/2014   Procedure: BIOPSY;  Surgeon: Danie Binder, MD;  Location: [facility];  Service: Endoscopy;  Laterality: N/A;  Anal Canal  . COLOSTOMY N/A 02/11/2015   Procedure: COLOSTOMY;  Surgeon: Aviva Signs, MD;  Location: [facility];  Service: General;  Laterality: N/A;  . FLEXIBLE SIGMOIDOSCOPY N/A 09/30/2014   Procedure: FLEXIBLE SIGMOIDOSCOPY;  Surgeon: Danie Binder, MD;  Location: [facility];  Service: Endoscopy;  Laterality: N/A;  . FLEXIBLE SIGMOIDOSCOPY N/A 10/17/2014   Procedure: FLEXIBLE SIGMOIDOSCOPY;  Surgeon: Danie Binder, MD;  Location: [facility];  Service: Endoscopy;  Laterality: N/A;  1325  . HYPOSPADIAS CORRECTION    . IR CATHETER TUBE CHANGE  11/06/2016  . IR CATHETER TUBE CHANGE  11/20/2017  . IR GENERIC HISTORICAL  12/17/2015   IR CATHETER TUBE CHANGE 12/17/2015 [facility]  . IR GENERIC HISTORICAL  01/27/2016   IR  NEPHROSTOMY PLACEMENT RIGHT 01/27/2016 [facility]  . IR GENERIC HISTORICAL  01/27/2016   IR NEPHROSTOMY PLACEMENT LEFT 01/27/2016 [facility]  . IR GENERIC HISTORICAL  03/08/2016   IR NEPHROSTOMY EXCHANGE LEFT 03/08/2016 Greggory Keen, MD [facility]  . IR GENERIC HISTORICAL  03/08/2016   IR NEPHROSTOMY EXCHANGE RIGHT 03/08/2016 Greggory Keen, MD [facility]  . IR GENERIC HISTORICAL  03/07/2016   IR CATHETER TUBE CHANGE 03/07/2016 [facility]  . IR GENERIC HISTORICAL  03/28/2016   IR CATHETER TUBE CHANGE 03/28/2016 Corrie Mckusick, DO [facility]  . IR GENERIC HISTORICAL  03/28/2016   IR NEPHROSTOMY TUBE CHANGE 03/28/2016 Corrie Mckusick, DO [facility]  [facility]  . IR GENERIC HISTORICAL  06/16/2016   IR CATHETER TUBE CHANGE 06/16/2016 Aletta Edouard, MD [facility]  . IR GENERIC HISTORICAL  06/16/2016   IR NEPHROSTOGRAM LEFT THRU EXISTING ACCESS 06/16/2016 Aletta Edouard, MD [facility]  . IR NEPHRO TUBE REMOV/FL  08/08/2017  . IR NEPHRO TUBE REMOV/FL  08/08/2017  . IR NEPHROSTOGRAM LEFT THRU EXISTING ACCESS  08/08/2017  . IR NEPHROSTOGRAM RIGHT THRU EXISTING ACCESS  08/08/2017  . IR NEPHROSTOMY PLACEMENT LEFT  04/08/2017  . IR NEPHROSTOMY PLACEMENT RIGHT  04/08/2017  . IRRIGATION AND DEBRIDEMENT BUTTOCKS Left 04/10/2017   Procedure: IRRIGATION AND DEBRIDEMENT BUTTOCKS left abscess;  Surgeon: Coralie Keens, MD;  Location: [place];  Service: General;  Laterality: Left;  . KNEE SURGERY Left    arthroscopy  . LAPAROSCOPIC PARTIAL NEPHRECTOMY  2012   right side  . LUNG BIOPSY Right   . MICROLARYNGOSCOPY N/A 10/30/2017   Procedure: MICROLARYNGOSCOPY WITH PROLARYN INJECTION;  Surgeon: Melida Quitter, MD;  Location: [facility] OR;  Service: ENT;  Laterality: N/A;  . PERINEAL PROCTECTOMY N/A 08/26/2015   Procedure:  PROCTECTOMY;  Surgeon: Aviva Signs, MD;  Location: [facility];  Service: General;  Laterality: N/A;  . RECTAL SURGERY          Home Medications    Prior to Admission medications   Medication Sig Start Date End Date Taking? Authorizing Provider  amitriptyline (ELAVIL) 50 MG tablet TAKE ONE TABLET BY MOUTH AT BEDTIME. 05/10/16   [place], Kelby Fam, MD  gabapentin (NEURONTIN) 100 MG capsule Take 100 mg by mouth 3 (three) times daily as needed (pain).     [provider]  HYDROmorphone (DILAUDID) 4 MG tablet Take 1 tablet (4 mg total) by mouth every 6 (six) hours as needed (breakthrough pain). Patient taking differently: Take 4 mg by mouth every 4 (four) hours as needed for moderate pain (breakthrough pain).  04/19/17   [name], FNP  levofloxacin (LEVAQUIN) 500 MG tablet Take 500 mg by mouth daily.    [provider]  LORazepam  (ATIVAN) 1 MG tablet Take 1 mg by mouth every 4 (four) hours as needed for anxiety.     [provider]  Morphine Sulfate ER 60 MG T12A Take 60 mg by mouth 4 (four) times daily.     [provider]  OXYGEN Inhale 4 L into the lungs as needed (shortness of breath).    [provider]  pregabalin (LYRICA) 75 MG capsule Take 75 mg by mouth 2 (two) times daily.    [provider]  promethazine (PHENERGAN) 25 MG tablet Take 25 mg by mouth every 4 (four) hours as needed for nausea or vomiting.    [provider]  temazepam (RESTORIL) 15 MG capsule Take 1 capsule (15 mg total) by mouth at bedtime. 04/19/17   Smith Robert,  Asencion Partridge, FNP    Family History Family History  Problem Relation Age of Onset  . Healthy Mother   . Lung cancer Maternal Grandfather   . Colon cancer Maternal Uncle        age 88  . Diabetes Other     Social History Social History   Tobacco Use  . Smoking status: Current Every Day Smoker    Packs/day: 1.00    Years: 30.00    Pack years: 30.00    Types: Cigarettes  . Smokeless tobacco: Never Used  Substance Use Topics  . Alcohol use: Yes    Alcohol/week: 0.0 oz    Comment: Occ  . Drug use: No     Allergies   Oxaliplatin and Fentanyl   Review of Systems Review of Systems  HENT: Negative for dental problem.   Respiratory: Negative for shortness of breath.   Gastrointestinal: Positive for abdominal pain.  Genitourinary: Positive for difficulty urinating.  Musculoskeletal: Negative for back pain.  Skin: Negative for wound.  Neurological: Negative for weakness.  Psychiatric/Behavioral: Negative for confusion.     Physical Exam Updated Vital Signs BP 139/85 (BP Location: Right Arm)   Pulse (!) 107   Temp 98 F (36.7 C) (Oral)   Resp 18   Ht 5\' 8"  (1.727 m)   Wt 86.2 kg (190 lb)   SpO2 97%   BMI 28.89 kg/m   Physical Exam  Constitutional: He appears well-developed.  HENT:  Head: Normocephalic.  Eyes:  Pupils are equal, round, and reactive to light.  Neck: Neck supple.  Cardiovascular:  Mild tachycardia  Abdominal:  Irregular abdomen with colostomy left lower quadrant.  Suprapubic ostomy.  Suprapubic tenderness.  Neurological: He is alert.  Skin: Skin is warm. Capillary refill takes less than 2 seconds.     ED Treatments / Results  Labs (all labs ordered are listed, but only abnormal results are displayed) Labs Reviewed  CBC WITH DIFFERENTIAL/PLATELET  BASIC METABOLIC PANEL    EKG None  Radiology No results found.  Procedures Procedures (including critical care time)  Medications Ordered in ED Medications - No data to display   Initial Impression / Assessment and Plan / ED Course  I have reviewed the triage vital signs and the nursing notes.  Pertinent labs & imaging results that were available during my care of the patient were reviewed by me and considered in my medical decision making (see chart for details).     Patient has suprapubic catheter.  Unable to place Foley catheter in the ER.  Mild bleeding after attempt.  Discussed with Dr. Tresa Moore from urology.  I am unable to get interventional radiology to place the catheter at any pen and will require transfer to [facility].  Is also discussed with Dr. Francia Greaves in the ER.  Final Clinical Impressions(s) / ED Diagnoses   Final diagnoses:  Suprapubic catheter dysfunction, initial encounter [facility])    ED Discharge Orders    None       Davonna Belling, MD 11/28/17 1559

## 2017-11-28 NOTE — ED Provider Notes (Signed)
Dr Tresa Moore evaluated the patient.  Please see his note.  We were asked to discharge the patient from the ED   Dorie Rank, MD 11/28/17 1929

## 2017-11-28 NOTE — ED Triage Notes (Signed)
Patient went to [facility] ED, needs subpra cath replaced, 11-20-17 procedure was done for rectal cancer.  Patient took 4mg  of dilaudid prior to transfer.  Patient needs to see urology for replacement of cath.

## 2017-11-28 NOTE — ED Triage Notes (Signed)
PT states his suprapubic catheter came out around 0300 this am. PT states catheter was replaced at  on 11/20/17. PT states some lower abdominal discomfort upon arrival to ED.

## 2017-11-28 NOTE — ED Notes (Signed)
Called report to [place] at [facility] ED. Pt informed [facility] that he took his own pain medication before that got here. Ask what he took stated Dilaudid 4mg  oral

## 2017-11-28 NOTE — ED Notes (Signed)
Urology is on the way to see patient

## 2017-12-31 DEATH — deceased
